# Patient Record
Sex: Male | Born: 1960 | Race: White | Hispanic: No | Marital: Married | Smoking: Never smoker
Health system: Southern US, Community
[De-identification: ages and names within clinical notes are randomized; demographics above are authoritative.]

## PROBLEM LIST (undated history)

## (undated) DIAGNOSIS — I10 Essential (primary) hypertension: Secondary | ICD-10-CM

## (undated) DIAGNOSIS — M1990 Unspecified osteoarthritis, unspecified site: Secondary | ICD-10-CM

## (undated) DIAGNOSIS — M549 Dorsalgia, unspecified: Secondary | ICD-10-CM

## (undated) DIAGNOSIS — Z9889 Other specified postprocedural states: Secondary | ICD-10-CM

## (undated) DIAGNOSIS — G894 Chronic pain syndrome: Secondary | ICD-10-CM

## (undated) DIAGNOSIS — E785 Hyperlipidemia, unspecified: Secondary | ICD-10-CM

## (undated) DIAGNOSIS — E119 Type 2 diabetes mellitus without complications: Secondary | ICD-10-CM

## (undated) DIAGNOSIS — Z87442 Personal history of urinary calculi: Secondary | ICD-10-CM

## (undated) DIAGNOSIS — K589 Irritable bowel syndrome without diarrhea: Secondary | ICD-10-CM

## (undated) DIAGNOSIS — M4800 Spinal stenosis, site unspecified: Secondary | ICD-10-CM

## (undated) DIAGNOSIS — S83289A Other tear of lateral meniscus, current injury, unspecified knee, initial encounter: Secondary | ICD-10-CM

## (undated) DIAGNOSIS — K219 Gastro-esophageal reflux disease without esophagitis: Secondary | ICD-10-CM

## (undated) DIAGNOSIS — E7800 Pure hypercholesterolemia, unspecified: Secondary | ICD-10-CM

## (undated) DIAGNOSIS — G4730 Sleep apnea, unspecified: Secondary | ICD-10-CM

## (undated) DIAGNOSIS — Z0000 Encounter for general adult medical examination without abnormal findings: Secondary | ICD-10-CM

## (undated) HISTORY — PX: ROTATOR CUFF REPAIR: SHX139

## (undated) HISTORY — PX: TONSILLECTOMY: SUR1361

## (undated) HISTORY — PX: PALATE SURGERY: SHX729

## (undated) HISTORY — DX: Unspecified osteoarthritis, unspecified site: M19.90

## (undated) HISTORY — PX: SHOULDER SURGERY: SHX246

## (undated) HISTORY — DX: Chronic pain syndrome: G89.4

## (undated) HISTORY — PX: CERVICAL FUSION: SHX112

## (undated) HISTORY — PX: FOOT SURGERY: SHX648

---

## 1993-01-17 HISTORY — PX: CERVICAL FUSION: SHX112

## 2001-05-03 ENCOUNTER — Ambulatory Visit (HOSPITAL_COMMUNITY): Admission: RE | Admit: 2001-05-03 | Discharge: 2001-05-03 | Payer: Self-pay | Admitting: Neurosurgery

## 2001-05-03 ENCOUNTER — Encounter: Payer: Self-pay | Admitting: Neurosurgery

## 2001-05-29 ENCOUNTER — Encounter: Payer: Self-pay | Admitting: Neurosurgery

## 2001-05-29 ENCOUNTER — Encounter: Admission: RE | Admit: 2001-05-29 | Discharge: 2001-05-29 | Payer: Self-pay | Admitting: Neurosurgery

## 2003-01-17 ENCOUNTER — Other Ambulatory Visit: Payer: Self-pay

## 2003-01-24 HISTORY — PX: COLONOSCOPY: SHX174

## 2004-04-05 ENCOUNTER — Ambulatory Visit: Payer: Self-pay | Admitting: Otolaryngology

## 2004-06-01 ENCOUNTER — Ambulatory Visit: Payer: Self-pay | Admitting: Urology

## 2004-12-15 ENCOUNTER — Other Ambulatory Visit: Payer: Self-pay

## 2004-12-22 ENCOUNTER — Ambulatory Visit: Payer: Self-pay | Admitting: Specialist

## 2005-05-27 ENCOUNTER — Other Ambulatory Visit: Payer: Self-pay

## 2005-05-31 ENCOUNTER — Emergency Department: Payer: Self-pay | Admitting: Emergency Medicine

## 2005-06-02 ENCOUNTER — Ambulatory Visit: Payer: Self-pay | Admitting: Specialist

## 2006-01-04 ENCOUNTER — Ambulatory Visit: Payer: Self-pay | Admitting: Otolaryngology

## 2006-01-13 ENCOUNTER — Emergency Department: Payer: Self-pay | Admitting: Otolaryngology

## 2006-06-05 ENCOUNTER — Emergency Department: Payer: Self-pay | Admitting: General Practice

## 2006-12-06 ENCOUNTER — Ambulatory Visit: Payer: Self-pay

## 2006-12-22 ENCOUNTER — Ambulatory Visit: Payer: Self-pay | Admitting: Urology

## 2007-01-02 ENCOUNTER — Ambulatory Visit: Payer: Self-pay | Admitting: Urology

## 2007-06-19 ENCOUNTER — Ambulatory Visit (HOSPITAL_BASED_OUTPATIENT_CLINIC_OR_DEPARTMENT_OTHER): Admission: RE | Admit: 2007-06-19 | Discharge: 2007-06-19 | Payer: Self-pay | Admitting: Orthopedic Surgery

## 2007-06-19 ENCOUNTER — Encounter (INDEPENDENT_AMBULATORY_CARE_PROVIDER_SITE_OTHER): Payer: Self-pay | Admitting: Orthopedic Surgery

## 2008-06-04 ENCOUNTER — Encounter: Admission: RE | Admit: 2008-06-04 | Discharge: 2008-06-04 | Payer: Self-pay | Admitting: Orthopedic Surgery

## 2008-06-09 ENCOUNTER — Emergency Department: Payer: Self-pay | Admitting: Emergency Medicine

## 2008-09-16 ENCOUNTER — Emergency Department: Payer: Self-pay | Admitting: Emergency Medicine

## 2009-01-20 ENCOUNTER — Emergency Department: Payer: Self-pay | Admitting: Emergency Medicine

## 2009-03-27 ENCOUNTER — Ambulatory Visit: Payer: Self-pay | Admitting: Neurosurgery

## 2009-11-30 ENCOUNTER — Emergency Department: Payer: Self-pay | Admitting: Emergency Medicine

## 2009-12-22 ENCOUNTER — Encounter: Admission: RE | Admit: 2009-12-22 | Discharge: 2009-12-22 | Payer: Self-pay | Admitting: Neurosurgery

## 2010-02-11 ENCOUNTER — Ambulatory Visit: Payer: Self-pay | Admitting: Urology

## 2010-02-25 ENCOUNTER — Ambulatory Visit: Payer: Self-pay | Admitting: Urology

## 2010-06-01 NOTE — Op Note (Signed)
NAME:  Adam Petty, Adam Petty NO.:  [id number]   MEDICAL RECORD NO.:  [id number]          PATIENT TYPE:  AMB   LOCATION:  DSC                          FACILITY:  [facility]   PHYSICIAN:  Leonides Grills, M.D.     DATE OF BIRTH:  09/13/1960   DATE OF PROCEDURE:  06/19/2007  DATE OF DISCHARGE:                               OPERATIVE REPORT   PREOPERATIVE DIAGNOSIS:  Right benign deep soft tissue lesion, foot.   POSTOPERATIVE DIAGNOSIS:  Right benign deep soft tissue lesion, foot.   OPERATION:  1. Excision right benign deep soft tissue lesion, foot.  2. Right superficial peroneal nerve neurolysis.   ANESTHESIA:  General.   SURGEON:  Leonides Grills, MD   ASSISTANT:  Richardean Canal, PA   ESTIMATED BLOOD LOSS:  Minimal.   TOURNIQUET TIME:  Approximately 40 minutes.   COMPLICATIONS:  None.   SPECIMEN:  A cystic-type structure and there was no fluid within the  cyst.  It appeared that it had popped.  The specimen was sent to  pathology.   DISPOSITION:  Stable to the PR.   INDICATIONS:  This is a 50 year old male who while at work approximately  7 months ago stepped off a step awkwardly and twisted his foot.  Since  this time, he has had persistent pain on the dorsum of his foot.  MRI  revealed a cystic lesion and he was tender directly over the cystic  lesion.  He was consented for the above procedure.  All risks of  infection or vessel injury, recurrence of lesion, recurrence of pain,  persistent pain, prolonged recovery, scar tissue formation, and extensor  contracture were all explained.  Questions were encouraged and answered.   OPERATION:  The patient was brought to the operating room and placed in  supine position, after adequate general endotracheal tube anesthesia was  administered as well as Ancef 1 g IV piggyback.  The right lower  extremity was then prepped and draped in sterile manner with a  proximally placed thigh tourniquet.  The limb was gravity  exsanguinated.  Tourniquet was elevated to 290 mmHg.  A longitudinal incision between  the third and fourth metatarsal shaft was then made.  Dissection was  carried down through the skin.  Hemostasis was obtained.  Superficial  peroneal nerve was then immediately identified which was directly over  this area and this was carefully dissected out and a formal neurolysis  was then performed.  This was then mobilized.  We then incised the  retinaculum over the extensor tendons and retracted the extensor  digitorum longus to the third and fourth toes respectively.  We then  underneath the brevis tendon found a what appears to be a cyst that was  popped and was right next to the metatarsal shaft and underneath this  muscle belly area.  This was carefully dissected out in its entirety.  It was almost 2 cm long about 6-7 mm wide.  Once this was carefully  dissected out and resected, we then removed the dorsal fascia and the  interosseous muscles well just in case it originated from this area  as  well as a portion of the brevis tendon that was in the area as well.  All this was sent to pathology.  There was also a mobile lesion that you  could feel through the skin on the dorsal aspect at that TMT joint  level.  This was dissected out and it appeared to be a what appeared to  be a thrombosed vessel in this area as well and this again was removed.  Tourniquet was deflated.  Hemostasis was obtained.  There was no  pulsatile bleeding.  The area was copiously irrigated with normal  saline.  Subcu was closed with 3-0 Vicryl.  Skin was closed with 4-0  nylon.  Sterile dressing was applied.  Cam walker boot was applied.  Patient was stable to PR.      Leonides Grills, M.D.  Electronically Signed     PB/MEDQ  D:  06/19/2007  T:  06/19/2007  Job:  [id number]

## 2010-06-04 NOTE — Op Note (Signed)
[place]  Patient:    Adam Petty, Adam Petty Visit Number: [id number] MRN: [id number]          Service Type: DSU Location: 3000 [id number] Attending Physician:  Gerald Dexter Dictated by:   Reinaldo Meeker, M.D. Proc. Date: 05/04/01 Admit Date:  05/03/2001 Discharge Date: 05/03/2001                             Operative Report  PREOPERATIVE DIAGNOSIS:  Herniated disk, C6-7 right.  POSTOPERATIVE DIAGNOSIS:  Herniated disk, C6-7 right.  OPERATION PERFORMED:  C6-7 anterior cervical diskectomy with bone bank fusion followed by Zephyr anterior cervical plating with the operating microscope.  SURGEON:  Reinaldo Meeker, M.D.  ASSISTANT:  Julio Sicks, M.D.  ANESTHESIA:  General.  DESCRIPTION OF PROCEDURE:  After being placed in supine position and 10 pounds of halter traction, the patients neck was prepped with Betadine and draped in the usual sterile fashion.  Localizing fluoroscopy was used prior to incision to identify the appropriate level.  A transverse incision was made in the right anterior neck starting at the midline and heading towards the medial aspect of the sternocleidomastoid muscle.  The platysma was then incised transversely.  The natural fascial plane between the strap muscles medially and the sternocleidomastoid laterally was identified and followed down to the anterior aspect of the cervical spine.  The longus colli muscles were identified and split in the midline and stripped away bilaterally with a Optician, dispensing.  A self-retaining retractor was placed for exposure.  A second fluoroscopy showed approach to the C6-7 level.  Using the 15 blade, the annulus of the disk was incised.  Using pituitary rongeurs and curets, approximately 90% of the disk material was removed.  The microscope was draped and brought into the field and used for the remainder of the case. Using microdissection technique, the  remainder of the disk material down to the posterior longitudinal ligament was removed.  The ligament was then incised transversely.  The cut edges were removed.  Towards the right, symptomatic side, a large amount of herniated disk material was found dorsal to the ligament and this was removed to give excellent decompression of the underlying  C7 nerve root which was tracked into its proximal neural foramen. At this point inspection was carried out in all directions for any evidence of residual compression.  None could be identified.  Large amounts of irrigation were carried out.  Measurements were taken and an 8 mm bone bank plug was reconstituted.  After irrigating once more and confirming hemostasis, the plug was impacted without difficulty.  A 25 mm Zephyr anterior cervical plate was then chosen.  Under fluoroscopic guidance, drill holes were placed followed by placement of 13 mm screws times four.  Final fluoroscopy showed the plate and plug to be in good position.  At this point large amounts of irrigation were carried out and any bleeding was controlled with bipolar coagulation. The wound was then closed using interrupted Vicryl on the platysma muscle, inverted 5-0 PDS on the subcuticular layer and Steri-Strips on the skin. Sterile dressing was then applied ____________ soft collar and the patient was extubated and taken to recovery room in stable condition. Dictated by:   Reinaldo Meeker, M.D. Attending Physician:  Gerald Dexter DD:  05/04/01 TD:  05/04/01 Job: [id number]

## 2010-10-14 LAB — BASIC METABOLIC PANEL
BUN: 17
Calcium: 9.4
Chloride: 106
Creatinine, Ser: 1
GFR calc Af Amer: >60

## 2010-10-14 LAB — POCT HEMOGLOBIN-HEMACUE: Hemoglobin: 10.3 — ABNORMAL LOW

## 2010-12-13 ENCOUNTER — Encounter: Payer: Self-pay | Admitting: Orthopedic Surgery

## 2010-12-18 ENCOUNTER — Encounter: Payer: Self-pay | Admitting: Orthopedic Surgery

## 2011-01-18 ENCOUNTER — Encounter: Payer: Self-pay | Admitting: Orthopedic Surgery

## 2011-02-02 ENCOUNTER — Emergency Department: Payer: Self-pay | Admitting: Emergency Medicine

## 2011-02-18 ENCOUNTER — Encounter: Payer: Self-pay | Admitting: Orthopedic Surgery

## 2011-03-15 ENCOUNTER — Encounter: Payer: Self-pay | Admitting: Psychiatry

## 2011-03-18 ENCOUNTER — Encounter: Payer: Self-pay | Admitting: Orthopedic Surgery

## 2011-03-18 ENCOUNTER — Encounter: Payer: Self-pay | Admitting: Psychiatry

## 2011-04-18 ENCOUNTER — Encounter: Payer: Self-pay | Admitting: Orthopedic Surgery

## 2011-04-18 ENCOUNTER — Encounter: Payer: Self-pay | Admitting: Psychiatry

## 2011-05-18 ENCOUNTER — Encounter: Payer: Self-pay | Admitting: Psychiatry

## 2012-01-31 ENCOUNTER — Ambulatory Visit: Payer: Self-pay

## 2012-02-16 ENCOUNTER — Ambulatory Visit: Payer: Self-pay

## 2012-05-16 ENCOUNTER — Ambulatory Visit: Payer: Self-pay | Admitting: Pain Medicine

## 2012-05-29 ENCOUNTER — Other Ambulatory Visit: Payer: Self-pay | Admitting: Pain Medicine

## 2012-05-29 LAB — BASIC METABOLIC PANEL
Anion Gap: 6 — ABNORMAL LOW (ref 7–16)
Calcium, Total: 9.7 mg/dL (ref 8.5–10.1)
Chloride: 106 mmol/L (ref 98–107)
Co2: 25 mmol/L (ref 21–32)
Creatinine: 0.94 mg/dL (ref 0.60–1.30)
EGFR (African American): >60
EGFR (Non-African Amer.): >60
Potassium: 4 mmol/L (ref 3.5–5.1)

## 2012-05-29 LAB — SEDIMENTATION RATE: Erythrocyte Sed Rate: 6 mm/hr (ref 0–15)

## 2012-05-29 LAB — URIC ACID: Uric Acid: 4.1 mg/dL (ref 3.5–7.2)

## 2012-07-05 ENCOUNTER — Ambulatory Visit: Payer: Self-pay | Admitting: Pain Medicine

## 2012-08-06 ENCOUNTER — Ambulatory Visit: Payer: Self-pay | Admitting: Pain Medicine

## 2012-08-30 ENCOUNTER — Observation Stay: Payer: Self-pay | Admitting: Internal Medicine

## 2012-08-30 LAB — COMPREHENSIVE METABOLIC PANEL
Albumin: 3.7 g/dL (ref 3.4–5.0)
Anion Gap: 3 — ABNORMAL LOW (ref 7–16)
Anion Gap: 4 — ABNORMAL LOW (ref 7–16)
BUN: 19 mg/dL — ABNORMAL HIGH (ref 7–18)
Bilirubin,Total: 0.5 mg/dL (ref 0.2–1.0)
Calcium, Total: 9.4 mg/dL (ref 8.5–10.1)
Chloride: 103 mmol/L (ref 98–107)
Chloride: 103 mmol/L (ref 98–107)
Co2: 25 mmol/L (ref 21–32)
Creatinine: 0.63 mg/dL (ref 0.60–1.30)
EGFR (African American): >60
EGFR (Non-African Amer.): >60
Glucose: 131 mg/dL — ABNORMAL HIGH (ref 65–99)
Glucose: 124 mg/dL — ABNORMAL HIGH (ref 65–99)
Osmolality: 267 (ref 275–301)
Osmolality: 272 (ref 275–301)
Potassium: 6.6 mmol/L (ref 3.5–5.1)
SGOT(AST): 101 U/L — ABNORMAL HIGH (ref 15–37)
SGPT (ALT): 85 U/L — ABNORMAL HIGH (ref 12–78)
SGPT (ALT): 85 U/L — ABNORMAL HIGH (ref 12–78)
Sodium: 134 mmol/L — ABNORMAL LOW (ref 136–145)

## 2012-08-30 LAB — CBC
MCH: 29.9 pg (ref 26.0–34.0)
MCV: 85 fL (ref 80–100)
Platelet: 186 10*3/uL (ref 150–440)
RBC: 5.16 10*6/uL (ref 4.40–5.90)
RDW: 14.1 % (ref 11.5–14.5)
WBC: 8.7 10*3/uL (ref 3.8–10.6)

## 2012-08-30 LAB — URINALYSIS, COMPLETE
Bacteria: NONE SEEN
Blood: NEGATIVE
Protein: NEGATIVE
Specific Gravity: 1.01 (ref 1.003–1.030)
Squamous Epithelial: NONE SEEN
WBC UR: <1 /HPF (ref 0–5)

## 2012-08-30 LAB — CK TOTAL AND CKMB (NOT AT ARMC)
CK, Total: 309 U/L — ABNORMAL HIGH (ref 35–232)
CK, Total: 234 U/L — ABNORMAL HIGH (ref 35–232)
CK-MB: 2.2 ng/mL (ref 0.5–3.6)
CK-MB: 1 ng/mL (ref 0.5–3.6)

## 2012-08-30 LAB — TROPONIN I: Troponin-I: <0.02 ng/mL

## 2012-08-31 LAB — CK TOTAL AND CKMB (NOT AT ARMC): CK, Total: 184 U/L (ref 35–232)

## 2012-08-31 LAB — BASIC METABOLIC PANEL
Anion Gap: 7 (ref 7–16)
Calcium, Total: 8.7 mg/dL (ref 8.5–10.1)
Creatinine: 1.06 mg/dL (ref 0.60–1.30)
Glucose: 151 mg/dL — ABNORMAL HIGH (ref 65–99)
Potassium: 3.9 mmol/L (ref 3.5–5.1)
Sodium: 135 mmol/L — ABNORMAL LOW (ref 136–145)

## 2012-08-31 LAB — CBC WITH DIFFERENTIAL/PLATELET
Basophil #: 0 10*3/uL (ref 0.0–0.1)
Basophil %: 0.5 %
HGB: 14.9 g/dL (ref 13.0–18.0)
Lymphocyte #: 3.1 10*3/uL (ref 1.0–3.6)
MCH: 30 pg (ref 26.0–34.0)
MCV: 86 fL (ref 80–100)
Monocyte #: 0.5 x10 3/mm (ref 0.2–1.0)
Monocyte %: 5.7 %
Neutrophil #: 5.2 10*3/uL (ref 1.4–6.5)
Neutrophil %: 57.4 %
Platelet: 158 10*3/uL (ref 150–440)

## 2012-08-31 LAB — LIPID PANEL: Triglycerides: 405 mg/dL — ABNORMAL HIGH (ref 0–200)

## 2012-08-31 LAB — TROPONIN I: Troponin-I: <0.02 ng/mL

## 2012-08-31 LAB — MAGNESIUM: Magnesium: 2.3 mg/dL

## 2012-08-31 LAB — HEMOGLOBIN A1C: Hemoglobin A1C: 9.2 % — ABNORMAL HIGH (ref 4.2–6.3)

## 2012-09-04 ENCOUNTER — Ambulatory Visit: Payer: Self-pay | Admitting: Pain Medicine

## 2012-09-14 ENCOUNTER — Ambulatory Visit: Payer: Self-pay | Admitting: Pain Medicine

## 2012-09-24 ENCOUNTER — Other Ambulatory Visit: Payer: Self-pay | Admitting: Pain Medicine

## 2012-09-24 LAB — BASIC METABOLIC PANEL
Anion Gap: 6 — ABNORMAL LOW (ref 7–16)
Chloride: 104 mmol/L (ref 98–107)
EGFR (African American): >60
EGFR (Non-African Amer.): >60
Osmolality: 279 (ref 275–301)
Potassium: 4 mmol/L (ref 3.5–5.1)
Sodium: 137 mmol/L (ref 136–145)

## 2012-10-19 ENCOUNTER — Ambulatory Visit: Payer: Self-pay | Admitting: Pain Medicine

## 2012-10-25 ENCOUNTER — Ambulatory Visit: Payer: Self-pay | Admitting: Pain Medicine

## 2012-11-14 ENCOUNTER — Ambulatory Visit: Payer: Self-pay | Admitting: Pain Medicine

## 2012-11-30 ENCOUNTER — Ambulatory Visit: Payer: Self-pay | Admitting: Pain Medicine

## 2012-12-22 ENCOUNTER — Emergency Department: Payer: Self-pay | Admitting: Emergency Medicine

## 2012-12-22 LAB — CBC
MCH: 28.5 pg (ref 26.0–34.0)
MCV: 86 fL (ref 80–100)
Platelet: 189 10*3/uL (ref 150–440)
RBC: 5.82 10*6/uL (ref 4.40–5.90)
WBC: 7.6 10*3/uL (ref 3.8–10.6)

## 2012-12-22 LAB — BASIC METABOLIC PANEL
Anion Gap: 10 (ref 7–16)
BUN: 14 mg/dL (ref 7–18)
Chloride: 101 mmol/L (ref 98–107)
Co2: 24 mmol/L (ref 21–32)
EGFR (African American): >60
EGFR (Non-African Amer.): >60
Osmolality: 277 (ref 275–301)
Potassium: 4.1 mmol/L (ref 3.5–5.1)
Sodium: 135 mmol/L — ABNORMAL LOW (ref 136–145)

## 2013-01-28 NOTE — Progress Notes (Signed)
Surgery scheduled for 02/06/13.  preop on 01/31/13 at 1330pm.  Need orders in EPIC.  Thank You.

## 2013-01-29 ENCOUNTER — Encounter (HOSPITAL_COMMUNITY): Payer: Self-pay | Admitting: Pharmacy Technician

## 2013-01-30 NOTE — Patient Instructions (Addendum)
Adam Petty  01/30/2013                           YOUR PROCEDURE IS SCHEDULED ON:  02/06/13               PLEASE REPORT TO SHORT STAY CENTER AT : 9:00 AM               CALL THIS NUMBER IF ANY PROBLEMS THE DAY OF SURGERY :               [phone]                      REMEMBER:   Do not eat food or drink liquids AFTER MIDNIGHT   Take these medicines the morning of surgery with A SIP OF WATER: CRESTOR / MAY TAKE OXYCODONE IF NEEDED FOR PAIN   Do not wear jewelry, make-up   Do not wear lotions, powders, or perfumes.   Do not shave legs or underarms 12 hrs. before surgery (men may shave face)  Do not bring valuables to the hospital.  Contacts, dentures or bridgework may not be worn into surgery.  Leave suitcase in the car. After surgery it may be brought to your room.  For patients admitted to the hospital more than one night, checkout time is 11:00                          The day of discharge.   Patients discharged the day of surgery will not be allowed to drive home                             If going home same day of surgery, must have someone stay with you first                           24 hrs at home and arrange for some one to drive you home from hospital.    Special Instructions:   Please read over the following fact sheets that you were given:               1. MRSA  INFORMATION                      2. [place] PREPARING FOR SURGERY SHEET                                                X_____________________________________________________________________        Failure to follow these instructions may result in cancellation of your surgery

## 2013-01-31 ENCOUNTER — Ambulatory Visit (HOSPITAL_COMMUNITY)
Admission: RE | Admit: 2013-01-31 | Discharge: 2013-01-31 | Disposition: A | Discharge status: Home / Self Care | Payer: Worker's Compensation | Source: Ambulatory Visit | Attending: Specialist | Admitting: Specialist

## 2013-01-31 ENCOUNTER — Encounter (HOSPITAL_COMMUNITY): Payer: Self-pay

## 2013-01-31 ENCOUNTER — Encounter (HOSPITAL_COMMUNITY)
Admission: RE | Admit: 2013-01-31 | Discharge: 2013-01-31 | Disposition: A | Discharge status: Home / Self Care | Payer: Worker's Compensation | Source: Ambulatory Visit | Attending: Specialist | Admitting: Specialist

## 2013-01-31 ENCOUNTER — Other Ambulatory Visit: Payer: Self-pay | Admitting: Orthopedic Surgery

## 2013-01-31 DIAGNOSIS — Z01818 Encounter for other preprocedural examination: Secondary | ICD-10-CM | POA: Diagnosis present

## 2013-01-31 DIAGNOSIS — Z01812 Encounter for preprocedural laboratory examination: Secondary | ICD-10-CM | POA: Diagnosis not present

## 2013-01-31 DIAGNOSIS — M5137 Other intervertebral disc degeneration, lumbosacral region: Secondary | ICD-10-CM | POA: Insufficient documentation

## 2013-01-31 DIAGNOSIS — M51379 Other intervertebral disc degeneration, lumbosacral region without mention of lumbar back pain or lower extremity pain: Secondary | ICD-10-CM | POA: Insufficient documentation

## 2013-01-31 HISTORY — DX: Dorsalgia, unspecified: M54.9

## 2013-01-31 HISTORY — DX: Personal history of urinary calculi: Z87.442

## 2013-01-31 HISTORY — DX: Irritable bowel syndrome, unspecified: K58.9

## 2013-01-31 HISTORY — DX: Type 2 diabetes mellitus without complications: E11.9

## 2013-01-31 HISTORY — DX: Encounter for general adult medical examination without abnormal findings: Z00.00

## 2013-01-31 LAB — SURGICAL PCR SCREEN
MRSA, PCR: NEGATIVE
STAPHYLOCOCCUS AUREUS: NEGATIVE

## 2013-01-31 LAB — CBC
HCT: 45 % (ref 39.0–52.0)
HEMOGLOBIN: 15.4 g/dL (ref 13.0–17.0)
MCH: 29.1 pg (ref 26.0–34.0)
MCHC: 34.2 g/dL (ref 30.0–36.0)
MCV: 84.9 fL (ref 78.0–100.0)
Platelets: 198 10*3/uL (ref 150–400)
RBC: 5.3 MIL/uL (ref 4.22–5.81)
RDW: 13.5 % (ref 11.5–15.5)
WBC: 8.7 10*3/uL (ref 4.0–10.5)

## 2013-01-31 LAB — BASIC METABOLIC PANEL
BUN: 16 mg/dL (ref 6–23)
CALCIUM: 9.9 mg/dL (ref 8.4–10.5)
CO2: 25 meq/L (ref 19–32)
Chloride: 100 mEq/L (ref 96–112)
Creatinine, Ser: 0.93 mg/dL (ref 0.50–1.35)
GFR calc Af Amer: >90 mL/min (ref >90)
GFR calc non Af Amer: >90 mL/min (ref >90)
GLUCOSE: 154 mg/dL — AB (ref 70–99)
Potassium: 5.2 mEq/L (ref 3.7–5.3)
SODIUM: 139 meq/L (ref 137–147)

## 2013-01-31 NOTE — Progress Notes (Signed)
EKG from [facility] - 08/30/12 - on chart

## 2013-01-31 NOTE — H&P (Signed)
Adam Petty is an 53 y.o. male.   Chief Complaint: back and leg pain HPI: The patient is a 53 year old male who presents today for follow up of their back. The patient is being followed for their low back symptoms. They are now 23 month(s) out from injury. Symptoms reported today include: pain. The patient states that they are doing poorly. Current treatment includes: activity modification and pain medications. The following medication has been used for pain control: Oxycodone (prescribed by pain management). The patient reports their current pain level to be moderate to severe. The patient indicates that they have questions or concerns today regarding surgery. Note for "Follow-up back": The patient is currently working light duty with a 10lb. lifting restriction, no prolonged sitting or standing, and no repetitive bending.   Apparently surgery has been approved. He continues to have severe right lower extremity radicular pain. He also has some pain into the left.  Past Medical History  Diagnosis Date  . Back pain   . IBS (irritable bowel syndrome)   . History of kidney stones   . Diabetes mellitus without complication   . Preventative health care     takes crestor/ altace for "preventative reasons" denies heart or BP problems    Past Surgical History  Procedure Laterality Date  . Tonsillectomy    . Shoulder surgery      x5 on RT / x1 LFT  . Palate surgery      surg for sleep apnea and sinus issues  . Cervical fusion    . Foot surgery      RT    No family history on file. Social History:  reports that he has never smoked. He does not have any smokeless tobacco history on file. He reports that he drinks alcohol. He reports that he does not use illicit drugs.  Allergies: No Known Allergies   (Not in a hospital admission)  Results for orders placed during the hospital encounter of 01/31/13 (from the past 48 hour(s))  SURGICAL PCR SCREEN     Status: None   Collection Time     01/31/13  2:07 PM      Result Value Range   MRSA, PCR NEGATIVE  NEGATIVE   Staphylococcus aureus NEGATIVE  NEGATIVE   Comment:            The Xpert SA Assay (FDA     approved for NASAL specimens     in patients over 1 years of age),     is one component of     a comprehensive surveillance     program.  Test performance has     been validated by [employer] for patients greater     than or equal to 41 year old.     It is not intended     to diagnose infection nor to     guide or monitor treatment.  CBC     Status: None   Collection Time    01/31/13  2:10 PM      Result Value Range   WBC 8.7  4.0 - 10.5 K/uL   RBC 5.30  4.22 - 5.81 MIL/uL   Hemoglobin 15.4  13.0 - 17.0 g/dL   HCT 45.0  39.0 - 52.0 %   MCV 84.9  78.0 - 100.0 fL   MCH 29.1  26.0 - 34.0 pg   MCHC 34.2  30.0 - 36.0 g/dL   RDW 13.5  11.5 -  15.5 %   Platelets 198  150 - 400 K/uL  BASIC METABOLIC PANEL     Status: Abnormal   Collection Time    01/31/13  2:10 PM      Result Value Range   Sodium 139  137 - 147 mEq/L   Potassium 5.2  3.7 - 5.3 mEq/L   Chloride 100  96 - 112 mEq/L   CO2 25  19 - 32 mEq/L   Glucose, Bld 154 (*) 70 - 99 mg/dL   BUN 16  6 - 23 mg/dL   Creatinine, Ser 0.93  0.50 - 1.35 mg/dL   Calcium 9.9  8.4 - 10.5 mg/dL   GFR calc non Af Amer >90  >90 mL/min   GFR calc Af Amer >90  >90 mL/min   Comment: (NOTE)     The eGFR has been calculated using the CKD EPI equation.     This calculation has not been validated in all clinical situations.     eGFR's persistently <90 mL/min signify possible Chronic Kidney     Disease.   Dg Chest 2 View  01/31/2013   CLINICAL DATA:  Preop lumbar spine surgery  EXAM: CHEST  2 VIEW  COMPARISON:  None.  FINDINGS: The heart size and mediastinal contours are within normal limits. Both lungs are clear. The visualized skeletal structures are unremarkable.  IMPRESSION: No active cardiopulmonary disease.   Electronically Signed   By: Skipper Cliche M.D.   On:  01/31/2013 15:16   X-ray Lumbar Spine Ap And Lateral  01/31/2013   CLINICAL DATA:  PREOP LUMBAR DECOMPRESSION  EXAM: LUMBAR SPINE - 2-3 VIEW  COMPARISON:  None.  FINDINGS: There are 5 nonrib bearing lumbar-type vertebral bodies. The vertebral body heights are maintained. The alignment is anatomic. There is no spondylolysis. There is no acute fracture or static listhesis. There is severe degenerative disc disease at L4-5 with facet arthropathy.  The SI joints are unremarkable.  IMPRESSION: Severe degenerative disc disease at L4-5.   Electronically Signed   By: Kathreen Devoid   On: 01/31/2013 16:06    Review of Systems  Constitutional: Negative.   HENT: Negative.   Eyes: Negative.   Respiratory: Negative.   Cardiovascular: Negative.   Gastrointestinal: Negative.   Genitourinary: Negative.   Musculoskeletal: Positive for back pain.  Skin: Negative.   Neurological: Positive for focal weakness.  Psychiatric/Behavioral: Negative.     There were no vitals taken for this visit. Physical Exam  Constitutional: He is oriented to person, place, and time. He appears well-developed and well-nourished.  HENT:  Head: Normocephalic and atraumatic.  Eyes: Conjunctivae and EOM are normal. Pupils are equal, round, and reactive to light.  Neck: Normal range of motion. Neck supple.  Cardiovascular: Normal rate and regular rhythm.   Respiratory: Effort normal and breath sounds normal.  GI: Soft. Bowel sounds are normal.  Musculoskeletal:  On exam, he's upright. SLR continues to produce buttock, thigh and calf pain exacerbated with dorsal augmentation maneuver. EHL is 5-/5 as is his quad. Contralateral SLR produces buttock pain. He has no pain in the back with flexion or extension.  Lumbar spine exam reveals no evidence of soft tissue swelling, ecchymosis or deformity. The abdomen is soft and nontender. Nontender over the trochanters. No cellulitis or lymphadenopathy.  Motor is 5/5 including tibialis  anterior, plantar flexion, quadriceps and hamstrings. Patient is normoreflexic. There is no Babinski or clonus. Sensory exam is intact to light touch. The patient has good distal pulses. No  DVT. No pain and normal range of motion without instability of the hips, knees and ankles.  Inspection of the cervical spine reveals a normal lordosis without evidence of paraspinous spasms or soft tissue swelling. Nontender to palpation. Full flexion, full extension, full left and right lateral rotation. Extension combined with lateral flexion does not reproduce pain. Negative impingement sign, negative secondary impingement sign of the shoulders. Negative Tinel's at median and ulnar nerves at the elbow. Negative carpal compression test at the wrists. Motor of the upper extremities is 5/5 including biceps, triceps, brachioradialis,wrist flexion, wrist extension, finger flexion, finger extension. Reflexes are normoreflexic. Sensory exam is intact to light touch. There is no Hoffmann sign. Nontender over the thoracic spine.  Neurological: He is alert and oriented to person, place, and time. He has normal reflexes.  Skin: Skin is warm and dry.  Psychiatric: He has a normal mood and affect.    MRI demonstrates disc herniation, severe lateral recess stenosis at 4-5, neural foraminal stenosis. No instability in flexion or extension.  His x-rays demonstrate the defect in the endplate of 5.  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  As previously discussed, we discussed lumbar decompression at 4-5, laminectomy and bilateral foraminotomies of L4 and L5 without a concomitant fusion.  Risks and benefits of this procedure were discussed with the patient including worsening of symptoms, no changes in symptoms, recurrent disc herniation, scar tissue,  epidural fibrosis, damage to neurovascular structures, cerebral spinal fluid leak which would require repair or patching, DVT, PE, anesthetic complications, etc. We discussed the perioperative course, the hospitalization, and the need for postoperative rehabilitation and the time estimate for recovery. We also discussed the possibility of future surgery including repeat decompression, fusion. The patient was provided an illustrated handout which was discussed in detail. Appropriate anatomic models were used as well.  No history of MRSA.  Plan microlumbar decompression L4-5, possible L3-4  BISSELL, JACLYN M. for Dr. Tonita Cong 01/31/2013, 9:10 PM

## 2013-01-31 NOTE — Progress Notes (Signed)
No orders in EPIC at time of PST visit - orders done per anesthesia protocol

## 2013-01-31 NOTE — Progress Notes (Signed)
01/31/13 1347  OBSTRUCTIVE SLEEP APNEA  Have you ever been diagnosed with sleep apnea through a sleep study? No  Do you snore loudly (loud enough to be heard through closed doors)?  0  Do you often feel tired, fatigued, or sleepy during the daytime? 0  Has anyone observed you stop breathing during your sleep? 0  Do you have, or are you being treated for high blood pressure? 0  BMI more than 35 kg/m2? 1  Age over 51 years old? 1  Neck circumference greater than 40 cm/18 inches? 1  Gender: 1  Obstructive Sleep Apnea Score 4  Score 4 or greater  Results sent to PCP

## 2013-02-06 ENCOUNTER — Encounter (HOSPITAL_COMMUNITY)
Admission: RE | Disposition: A | Discharge status: Home / Self Care | Payer: Self-pay | Source: Ambulatory Visit | Attending: Specialist

## 2013-02-06 ENCOUNTER — Encounter (HOSPITAL_COMMUNITY): Payer: Self-pay | Admitting: *Deleted

## 2013-02-06 ENCOUNTER — Ambulatory Visit (HOSPITAL_COMMUNITY): Payer: Worker's Compensation | Admitting: Anesthesiology

## 2013-02-06 ENCOUNTER — Ambulatory Visit (HOSPITAL_COMMUNITY): Payer: Worker's Compensation

## 2013-02-06 ENCOUNTER — Observation Stay (HOSPITAL_COMMUNITY)
Admission: RE | Admit: 2013-02-06 | Discharge: 2013-02-07 | Disposition: A | Discharge status: Home / Self Care | Payer: Worker's Compensation | Source: Ambulatory Visit | Attending: Specialist | Admitting: Specialist

## 2013-02-06 ENCOUNTER — Encounter (HOSPITAL_COMMUNITY): Payer: Worker's Compensation | Admitting: Anesthesiology

## 2013-02-06 DIAGNOSIS — M48061 Spinal stenosis, lumbar region without neurogenic claudication: Secondary | ICD-10-CM | POA: Diagnosis present

## 2013-02-06 DIAGNOSIS — E119 Type 2 diabetes mellitus without complications: Secondary | ICD-10-CM | POA: Insufficient documentation

## 2013-02-06 DIAGNOSIS — X58XXXA Exposure to other specified factors, initial encounter: Secondary | ICD-10-CM | POA: Insufficient documentation

## 2013-02-06 DIAGNOSIS — Z79899 Other long term (current) drug therapy: Secondary | ICD-10-CM | POA: Insufficient documentation

## 2013-02-06 DIAGNOSIS — IMO0002 Reserved for concepts with insufficient information to code with codable children: Secondary | ICD-10-CM | POA: Insufficient documentation

## 2013-02-06 DIAGNOSIS — M5126 Other intervertebral disc displacement, lumbar region: Principal | ICD-10-CM | POA: Insufficient documentation

## 2013-02-06 HISTORY — PX: LUMBAR LAMINECTOMY/DECOMPRESSION MICRODISCECTOMY: SHX5026

## 2013-02-06 LAB — GLUCOSE, CAPILLARY
GLUCOSE-CAPILLARY: 143 mg/dL — AB (ref 70–99)
Glucose-Capillary: 137 mg/dL — ABNORMAL HIGH (ref 70–99)
Glucose-Capillary: 129 mg/dL — ABNORMAL HIGH (ref 70–99)
Glucose-Capillary: 155 mg/dL — ABNORMAL HIGH (ref 70–99)

## 2013-02-06 SURGERY — LUMBAR LAMINECTOMY/DECOMPRESSION MICRODISCECTOMY 2 LEVELS
Anesthesia: General | Site: Back

## 2013-02-06 MED ORDER — GLYCOPYRROLATE 0.2 MG/ML IJ SOLN
INTRAMUSCULAR | Status: AC
  Filled 2013-02-06: qty 3

## 2013-02-06 MED ORDER — LIDOCAINE HCL (CARDIAC) 20 MG/ML IV SOLN
INTRAVENOUS | Status: DC | PRN
  Administered 2013-02-06: 75 mg via INTRAVENOUS

## 2013-02-06 MED ORDER — HYDROMORPHONE HCL PF 1 MG/ML IJ SOLN
INTRAMUSCULAR | Status: AC
  Filled 2013-02-06: qty 1

## 2013-02-06 MED ORDER — METHOCARBAMOL 500 MG PO TABS
500.0000 mg | ORAL_TABLET | Freq: Four times a day (QID) | ORAL | Status: DC | PRN
  Administered 2013-02-07: 500 mg via ORAL
  Filled 2013-02-06: qty 1

## 2013-02-06 MED ORDER — ONDANSETRON HCL 4 MG/2ML IJ SOLN
INTRAMUSCULAR | Status: DC | PRN
  Administered 2013-02-06 (×2): 2 mg via INTRAVENOUS

## 2013-02-06 MED ORDER — NEOSTIGMINE METHYLSULFATE 1 MG/ML IJ SOLN
INTRAMUSCULAR | Status: DC | PRN
  Administered 2013-02-06: 4 mg via INTRAVENOUS

## 2013-02-06 MED ORDER — OXYCODONE HCL 5 MG PO TABS: 5.0000 mg | ORAL_TABLET | ORAL | Status: DC | PRN

## 2013-02-06 MED ORDER — MIDAZOLAM HCL 2 MG/2ML IJ SOLN
INTRAMUSCULAR | Status: AC
  Filled 2013-02-06: qty 2

## 2013-02-06 MED ORDER — FLEET ENEMA 7-19 GM/118ML RE ENEM: 1.0000 | ENEMA | Freq: Once | RECTAL | Status: AC | PRN

## 2013-02-06 MED ORDER — SUCCINYLCHOLINE CHLORIDE 20 MG/ML IJ SOLN
INTRAMUSCULAR | Status: DC | PRN
  Administered 2013-02-06: 140 mg via INTRAVENOUS

## 2013-02-06 MED ORDER — LACTATED RINGERS IV SOLN
INTRAVENOUS | Status: DC | PRN
  Administered 2013-02-06 (×2): via INTRAVENOUS

## 2013-02-06 MED ORDER — CISATRACURIUM BESYLATE (PF) 10 MG/5ML IV SOLN
INTRAVENOUS | Status: DC | PRN
  Administered 2013-02-06: 10 mg via INTRAVENOUS
  Administered 2013-02-06: 2 mg via INTRAVENOUS

## 2013-02-06 MED ORDER — RAMIPRIL 5 MG PO CAPS
5.0000 mg | ORAL_CAPSULE | Freq: Every day | ORAL | Status: DC
  Administered 2013-02-07: 5 mg via ORAL
  Filled 2013-02-06: qty 1

## 2013-02-06 MED ORDER — THROMBIN 5000 UNITS EX SOLR
CUTANEOUS | Status: DC | PRN
  Administered 2013-02-06: 10000 [IU] via TOPICAL

## 2013-02-06 MED ORDER — NEOSTIGMINE METHYLSULFATE 1 MG/ML IJ SOLN
INTRAMUSCULAR | Status: AC
  Filled 2013-02-06: qty 10

## 2013-02-06 MED ORDER — HYDROMORPHONE HCL PF 1 MG/ML IJ SOLN
0.5000 mg | INTRAMUSCULAR | Status: DC | PRN
  Administered 2013-02-06 – 2013-02-07 (×4): 1 mg via INTRAVENOUS
  Filled 2013-02-06 (×4): qty 1

## 2013-02-06 MED ORDER — OXYCODONE HCL 5 MG PO TABS
5.0000 mg | ORAL_TABLET | ORAL | Status: DC | PRN
  Administered 2013-02-06 – 2013-02-07 (×2): 10 mg via ORAL
  Filled 2013-02-06 (×3): qty 2

## 2013-02-06 MED ORDER — BUPIVACAINE-EPINEPHRINE (PF) 0.5% -1:200000 IJ SOLN
INTRAMUSCULAR | Status: DC | PRN
  Administered 2013-02-06: 12 mL

## 2013-02-06 MED ORDER — ACETAMINOPHEN 325 MG PO TABS: 650.0000 mg | ORAL_TABLET | ORAL | Status: DC | PRN

## 2013-02-06 MED ORDER — LIRAGLUTIDE 18 MG/3ML ~~LOC~~ SOPN: 1.8000 mg | PEN_INJECTOR | Freq: Every day | SUBCUTANEOUS | Status: DC

## 2013-02-06 MED ORDER — THROMBIN 5000 UNITS EX SOLR
CUTANEOUS | Status: AC
  Filled 2013-02-06: qty 10000

## 2013-02-06 MED ORDER — ZOLPIDEM TARTRATE 5 MG PO TABS
5.0000 mg | ORAL_TABLET | Freq: Every evening | ORAL | Status: DC | PRN
  Administered 2013-02-06: 5 mg via ORAL
  Filled 2013-02-06: qty 1

## 2013-02-06 MED ORDER — GLYCOPYRROLATE 0.2 MG/ML IJ SOLN
INTRAMUSCULAR | Status: DC | PRN
  Administered 2013-02-06: .6 mg via INTRAVENOUS

## 2013-02-06 MED ORDER — ONDANSETRON HCL 4 MG/2ML IJ SOLN
INTRAMUSCULAR | Status: AC
  Filled 2013-02-06: qty 2

## 2013-02-06 MED ORDER — CHLORHEXIDINE GLUCONATE 4 % EX LIQD: 60.0000 mL | Freq: Once | CUTANEOUS | Status: DC

## 2013-02-06 MED ORDER — FENTANYL CITRATE 0.05 MG/ML IJ SOLN
INTRAMUSCULAR | Status: DC | PRN
  Administered 2013-02-06 (×5): 50 ug via INTRAVENOUS

## 2013-02-06 MED ORDER — EPHEDRINE SULFATE 50 MG/ML IJ SOLN
INTRAMUSCULAR | Status: AC
  Filled 2013-02-06: qty 1

## 2013-02-06 MED ORDER — FENTANYL CITRATE 0.05 MG/ML IJ SOLN
INTRAMUSCULAR | Status: AC
  Filled 2013-02-06: qty 5

## 2013-02-06 MED ORDER — ALUM & MAG HYDROXIDE-SIMETH 200-200-20 MG/5ML PO SUSP: 30.0000 mL | Freq: Four times a day (QID) | ORAL | Status: DC | PRN

## 2013-02-06 MED ORDER — ACETAMINOPHEN 650 MG RE SUPP: 650.0000 mg | RECTAL | Status: DC | PRN

## 2013-02-06 MED ORDER — BUPIVACAINE-EPINEPHRINE PF 0.5-1:200000 % IJ SOLN
INTRAMUSCULAR | Status: AC
  Filled 2013-02-06: qty 30

## 2013-02-06 MED ORDER — PHENOL 1.4 % MT LIQD: 1.0000 | OROMUCOSAL | Status: DC | PRN

## 2013-02-06 MED ORDER — SODIUM CHLORIDE 0.9 % IR SOLN
Status: DC | PRN
  Administered 2013-02-06: 12:00:00

## 2013-02-06 MED ORDER — BISACODYL 5 MG PO TBEC
5.0000 mg | DELAYED_RELEASE_TABLET | Freq: Every day | ORAL | Status: DC | PRN
Start: 2013-02-06 — End: 2013-02-07

## 2013-02-06 MED ORDER — SUCCINYLCHOLINE CHLORIDE 20 MG/ML IJ SOLN
INTRAMUSCULAR | Status: AC
  Filled 2013-02-06: qty 1

## 2013-02-06 MED ORDER — HYDROCODONE-ACETAMINOPHEN 5-325 MG PO TABS
1.0000 | ORAL_TABLET | ORAL | Status: DC | PRN
  Administered 2013-02-07: 2 via ORAL
  Filled 2013-02-06 (×2): qty 2

## 2013-02-06 MED ORDER — HYDROMORPHONE HCL PF 1 MG/ML IJ SOLN
0.2500 mg | INTRAMUSCULAR | Status: DC | PRN
  Administered 2013-02-06 (×4): 0.5 mg via INTRAVENOUS

## 2013-02-06 MED ORDER — POLYETHYLENE GLYCOL 3350 17 G PO PACK: 17.0000 g | PACK | Freq: Every day | ORAL | Status: DC | PRN

## 2013-02-06 MED ORDER — MENTHOL 3 MG MT LOZG
1.0000 | LOZENGE | OROMUCOSAL | Status: DC | PRN
Start: 2013-02-06 — End: 2013-02-07

## 2013-02-06 MED ORDER — PROPOFOL 10 MG/ML IV BOLUS
INTRAVENOUS | Status: DC | PRN
  Administered 2013-02-06: 200 mg via INTRAVENOUS

## 2013-02-06 MED ORDER — INSULIN ASPART 100 UNIT/ML ~~LOC~~ SOLN: 0.0000 [IU] | Freq: Every day | SUBCUTANEOUS | Status: DC

## 2013-02-06 MED ORDER — MIDAZOLAM HCL 5 MG/5ML IJ SOLN
INTRAMUSCULAR | Status: DC | PRN
  Administered 2013-02-06 (×2): 1 mg via INTRAVENOUS

## 2013-02-06 MED ORDER — METHOCARBAMOL 100 MG/ML IJ SOLN
500.0000 mg | Freq: Four times a day (QID) | INTRAVENOUS | Status: DC | PRN
  Administered 2013-02-06: 500 mg via INTRAVENOUS
  Filled 2013-02-06: qty 5

## 2013-02-06 MED ORDER — CEFAZOLIN SODIUM-DEXTROSE 2-3 GM-% IV SOLR
INTRAVENOUS | Status: AC
  Filled 2013-02-06: qty 50

## 2013-02-06 MED ORDER — CEFAZOLIN SODIUM-DEXTROSE 2-3 GM-% IV SOLR
2.0000 g | INTRAVENOUS | Status: AC
  Administered 2013-02-06: 2 g via INTRAVENOUS

## 2013-02-06 MED ORDER — SODIUM CHLORIDE 0.9 % IJ SOLN: 3.0000 mL | INTRAMUSCULAR | Status: DC | PRN

## 2013-02-06 MED ORDER — LIDOCAINE HCL (CARDIAC) 20 MG/ML IV SOLN
INTRAVENOUS | Status: AC
  Filled 2013-02-06: qty 5

## 2013-02-06 MED ORDER — SODIUM CHLORIDE 0.45 % IV SOLN
INTRAVENOUS | Status: DC
  Administered 2013-02-06: 16:00:00 via INTRAVENOUS

## 2013-02-06 MED ORDER — METHOCARBAMOL 500 MG PO TABS: 500.0000 mg | ORAL_TABLET | Freq: Four times a day (QID) | ORAL | Status: DC

## 2013-02-06 MED ORDER — LACTATED RINGERS IV SOLN: INTRAVENOUS | Status: DC

## 2013-02-06 MED ORDER — CANAGLIFLOZIN 300 MG PO TABS
300.0000 mg | ORAL_TABLET | Freq: Every day | ORAL | Status: DC
  Administered 2013-02-07: 300 mg via ORAL
  Filled 2013-02-06: qty 1

## 2013-02-06 MED ORDER — CHLORHEXIDINE GLUCONATE CLOTH 2 % EX PADS: 6.0000 | MEDICATED_PAD | Freq: Once | CUTANEOUS | Status: DC

## 2013-02-06 MED ORDER — DEXAMETHASONE SODIUM PHOSPHATE 10 MG/ML IJ SOLN
INTRAMUSCULAR | Status: AC
  Filled 2013-02-06: qty 1

## 2013-02-06 MED ORDER — SODIUM CHLORIDE 0.9 % IV SOLN: 250.0000 mL | INTRAVENOUS | Status: DC

## 2013-02-06 MED ORDER — SODIUM CHLORIDE 0.9 % IJ SOLN: 3.0000 mL | Freq: Two times a day (BID) | INTRAMUSCULAR | Status: DC

## 2013-02-06 MED ORDER — PROPOFOL 10 MG/ML IV BOLUS
INTRAVENOUS | Status: AC
  Filled 2013-02-06: qty 20

## 2013-02-06 MED ORDER — DOCUSATE SODIUM 100 MG PO CAPS
100.0000 mg | ORAL_CAPSULE | Freq: Two times a day (BID) | ORAL | Status: DC
  Administered 2013-02-06 – 2013-02-07 (×2): 100 mg via ORAL

## 2013-02-06 MED ORDER — CANAGLIFLOZIN 300 MG PO TABS: 300.0000 mg | ORAL_TABLET | Freq: Every day | ORAL | Status: DC

## 2013-02-06 MED ORDER — METFORMIN HCL 500 MG PO TABS
1000.0000 mg | ORAL_TABLET | Freq: Two times a day (BID) | ORAL | Status: DC
  Administered 2013-02-07: 1000 mg via ORAL
  Filled 2013-02-06 (×3): qty 2

## 2013-02-06 MED ORDER — SODIUM CHLORIDE 0.9 % IJ SOLN
INTRAMUSCULAR | Status: AC
  Filled 2013-02-06: qty 10

## 2013-02-06 MED ORDER — CISATRACURIUM BESYLATE 20 MG/10ML IV SOLN
INTRAVENOUS | Status: AC
  Filled 2013-02-06: qty 10

## 2013-02-06 MED ORDER — ONDANSETRON HCL 4 MG/2ML IJ SOLN: 4.0000 mg | INTRAMUSCULAR | Status: DC | PRN

## 2013-02-06 MED ORDER — LIRAGLUTIDE 18 MG/3ML ~~LOC~~ SOPN: PEN_INJECTOR | Freq: Every day | SUBCUTANEOUS | Status: DC

## 2013-02-06 MED ORDER — DOCUSATE SODIUM 100 MG PO CAPS: 100.0000 mg | ORAL_CAPSULE | Freq: Two times a day (BID) | ORAL | Status: DC

## 2013-02-06 MED ORDER — PROMETHAZINE HCL 25 MG/ML IJ SOLN: 6.2500 mg | INTRAMUSCULAR | Status: DC | PRN

## 2013-02-06 MED ORDER — INSULIN ASPART 100 UNIT/ML ~~LOC~~ SOLN
0.0000 [IU] | Freq: Three times a day (TID) | SUBCUTANEOUS | Status: DC
Start: 2013-02-06 — End: 2013-02-07
  Administered 2013-02-06 – 2013-02-07 (×2): 2 [IU] via SUBCUTANEOUS

## 2013-02-06 MED ORDER — LACTATED RINGERS IV SOLN
INTRAVENOUS | Status: DC
  Administered 2013-02-06: 1000 mL via INTRAVENOUS

## 2013-02-06 MED ORDER — METFORMIN HCL 500 MG PO TABS: 1000.0000 mg | ORAL_TABLET | Freq: Two times a day (BID) | ORAL | Status: DC

## 2013-02-06 MED ORDER — CEFAZOLIN SODIUM-DEXTROSE 2-3 GM-% IV SOLR
2.0000 g | Freq: Three times a day (TID) | INTRAVENOUS | Status: AC
  Administered 2013-02-06 – 2013-02-07 (×2): 2 g via INTRAVENOUS
  Filled 2013-02-06 (×2): qty 50

## 2013-02-06 SURGICAL SUPPLY — 50 items
APL SKNCLS STERI-STRIP NONHPOA (GAUZE/BANDAGES/DRESSINGS) ×1
BAG SPEC THK2 15X12 ZIP CLS (MISCELLANEOUS) ×1
BAG ZIPLOCK 12X15 (MISCELLANEOUS) ×3 IMPLANT
BENZOIN TINCTURE PRP APPL 2/3 (GAUZE/BANDAGES/DRESSINGS) ×3 IMPLANT
CHLORAPREP W/TINT 26ML (MISCELLANEOUS) IMPLANT
CLEANER TIP ELECTROSURG 2X2 (MISCELLANEOUS) ×3 IMPLANT
CLOSURE WOUND 1/2 X4 (GAUZE/BANDAGES/DRESSINGS) ×1
CLOTH 2% CHLOROHEXIDINE 3PK (PERSONAL CARE ITEMS) ×3 IMPLANT
DECANTER SPIKE VIAL GLASS SM (MISCELLANEOUS) ×3 IMPLANT
DRAPE MICROSCOPE LEICA (MISCELLANEOUS) ×3 IMPLANT
DRAPE POUCH INSTRU U-SHP 10X18 (DRAPES) ×3 IMPLANT
DRAPE SURG 17X11 SM STRL (DRAPES) ×3 IMPLANT
DRAPE UTILITY XL STRL (DRAPES) ×3 IMPLANT
DRSG AQUACEL AG ADV 3.5X 4 (GAUZE/BANDAGES/DRESSINGS) IMPLANT
DRSG AQUACEL AG ADV 3.5X 6 (GAUZE/BANDAGES/DRESSINGS) ×2 IMPLANT
DURAPREP 26ML APPLICATOR (WOUND CARE) ×3 IMPLANT
DURASEAL SPINE SEALANT 3ML (MISCELLANEOUS) ×3 IMPLANT
ELECT REM PT RETURN 9FT ADLT (ELECTROSURGICAL) ×3
ELECTRODE REM PT RTRN 9FT ADLT (ELECTROSURGICAL) ×1 IMPLANT
GLOVE BIOGEL PI IND STRL 7.5 (GLOVE) ×1 IMPLANT
GLOVE BIOGEL PI INDICATOR 7.5 (GLOVE) ×2
GLOVE SURG SS PI 7.5 STRL IVOR (GLOVE) ×3 IMPLANT
GLOVE SURG SS PI 8.0 STRL IVOR (GLOVE) ×6 IMPLANT
GOWN STRL REUS W/TWL XL LVL3 (GOWN DISPOSABLE) ×6 IMPLANT
IV CATH 14GX2 1/4 (CATHETERS) ×3 IMPLANT
KIT BASIN OR (CUSTOM PROCEDURE TRAY) ×3 IMPLANT
KIT POSITIONING SURG ANDREWS (MISCELLANEOUS) ×3 IMPLANT
MANIFOLD NEPTUNE II (INSTRUMENTS) ×3 IMPLANT
NDL SPNL 18GX3.5 QUINCKE PK (NEEDLE) ×3 IMPLANT
NEEDLE SPNL 18GX3.5 QUINCKE PK (NEEDLE) ×9 IMPLANT
PATTIES SURGICAL .5 X.5 (GAUZE/BANDAGES/DRESSINGS) IMPLANT
PATTIES SURGICAL .75X.75 (GAUZE/BANDAGES/DRESSINGS) IMPLANT
PATTIES SURGICAL 1X1 (DISPOSABLE) IMPLANT
SPONGE LAP 4X18 X RAY DECT (DISPOSABLE) ×2 IMPLANT
SPONGE SURGIFOAM ABS GEL 100 (HEMOSTASIS) ×3 IMPLANT
STRIP CLOSURE SKIN 1/2X4 (GAUZE/BANDAGES/DRESSINGS) ×2 IMPLANT
SUT NURALON 4 0 TR CR/8 (SUTURE) ×3 IMPLANT
SUT PROLENE 3 0 PS 2 (SUTURE) ×3 IMPLANT
SUT VIC AB 1 CT1 27 (SUTURE)
SUT VIC AB 1 CT1 27XBRD ANTBC (SUTURE) IMPLANT
SUT VIC AB 1-0 CT2 27 (SUTURE) IMPLANT
SUT VIC AB 2-0 CT1 27 (SUTURE)
SUT VIC AB 2-0 CT1 TAPERPNT 27 (SUTURE) IMPLANT
SUT VIC AB 2-0 CT2 27 (SUTURE) ×2 IMPLANT
SYRINGE 10CC LL (SYRINGE) ×3 IMPLANT
TOWEL OR 17X26 10 PK STRL BLUE (TOWEL DISPOSABLE) ×3 IMPLANT
TOWEL OR NON WOVEN STRL DISP B (DISPOSABLE) ×3 IMPLANT
TRAY FOLEY CATH 16FR SILVER (SET/KITS/TRAYS/PACK) ×2 IMPLANT
TRAY LAMINECTOMY (CUSTOM PROCEDURE TRAY) ×3 IMPLANT
YANKAUER SUCT BULB TIP NO VENT (SUCTIONS) ×3 IMPLANT

## 2013-02-06 NOTE — Interval H&P Note (Signed)
History and Physical Interval Note:  02/06/2013 8:28 AM  Lilla ShookFranklin W Trudell  has presented today for surgery, with the diagnosis of stenosis L5 - S1 and L4 - L5  The various methods of treatment have been discussed with the patient and family. After consideration of risks, benefits and other options for treatment, the patient has consented to  Procedure(s): MICRO LUMBAR DECOMPRESSION L4 - L5 POSSIBLE L3 - L4 2 LEVELS (N/A) as a surgical intervention .  The patient's history has been reviewed, patient examined, no change in status, stable for surgery.  I have reviewed the patient's chart and labs.  Questions were answered to the patient's satisfaction.     Fumiko Cham C

## 2013-02-06 NOTE — Discharge Instructions (Signed)
Walk As Tolerated utilizing back precautions.  No bending, twisting, or lifting.  No driving for 2 weeks.   °Aquacel dressing may remain in place for 7 days. May shower with aquacel dressing in place. After 7 days, remove aquacel dressing and place gauze and tape dressing which should be kept clean and dry and changed daily. Do not remove steri-strips if they are present. °See Dr. Beane in office in 10 to 14 days. Begin taking aspirin 81mg per day starting 4 days after your surgery if not allergic to aspirin or on another blood thinner. °Walk daily even outside. Use a cane or walker only if necessary. °Avoid sitting on soft sofas. ° °

## 2013-02-06 NOTE — Preoperative (Signed)
Beta Blockers   Reason not to administer Beta Blockers:Not Applicable 

## 2013-02-06 NOTE — H&P (View-Only) (Signed)
Adam Petty is an 52 y.o. male.   Chief Complaint: back and leg pain HPI: The patient is a 52 year old male who presents today for follow up of their back. The patient is being followed for their low back symptoms. They are now 23 month(s) out from injury. Symptoms reported today include: pain. The patient states that they are doing poorly. Current treatment includes: activity modification and pain medications. The following medication has been used for pain control: Oxycodone (prescribed by pain management). The patient reports their current pain level to be moderate to severe. The patient indicates that they have questions or concerns today regarding surgery. Note for "Follow-up back": The patient is currently working light duty with a 10lb. lifting restriction, no prolonged sitting or standing, and no repetitive bending.   Apparently surgery has been approved. He continues to have severe right lower extremity radicular pain. He also has some pain into the left.  Past Medical History  Diagnosis Date  . Back pain   . IBS (irritable bowel syndrome)   . History of kidney stones   . Diabetes mellitus without complication   . Preventative health care     takes crestor/ altace for "preventative reasons" denies heart or BP problems    Past Surgical History  Procedure Laterality Date  . Tonsillectomy    . Shoulder surgery      x5 on RT / x1 LFT  . Palate surgery      surg for sleep apnea and sinus issues  . Cervical fusion    . Foot surgery      RT    No family history on file. Social History:  reports that he has never smoked. He does not have any smokeless tobacco history on file. He reports that he drinks alcohol. He reports that he does not use illicit drugs.  Allergies: No Known Allergies   (Not in a hospital admission)  Results for orders placed during the hospital encounter of 01/31/13 (from the past 48 hour(s))  SURGICAL PCR SCREEN     Status: None   Collection Time     01/31/13  2:07 PM      Result Value Range   MRSA, PCR NEGATIVE  NEGATIVE   Staphylococcus aureus NEGATIVE  NEGATIVE   Comment:            The Xpert SA Assay (FDA     approved for NASAL specimens     in patients over 21 years of age),     is one component of     a comprehensive surveillance     program.  Test performance has     been validated by [facility] for patients greater     than or equal to 1 year old.     It is not intended     to diagnose infection nor to     guide or monitor treatment.  CBC     Status: None   Collection Time    01/31/13  2:10 PM      Result Value Range   WBC 8.7  4.0 - 10.5 K/uL   RBC 5.30  4.22 - 5.81 MIL/uL   Hemoglobin 15.4  13.0 - 17.0 g/dL   HCT 45.0  39.0 - 52.0 %   MCV 84.9  78.0 - 100.0 fL   MCH 29.1  26.0 - 34.0 pg   MCHC 34.2  30.0 - 36.0 g/dL   RDW 13.5  11.5 -   15.5 %   Platelets 198  150 - 400 K/uL  BASIC METABOLIC PANEL     Status: Abnormal   Collection Time    01/31/13  2:10 PM      Result Value Range   Sodium 139  137 - 147 mEq/L   Potassium 5.2  3.7 - 5.3 mEq/L   Chloride 100  96 - 112 mEq/L   CO2 25  19 - 32 mEq/L   Glucose, Bld 154 (*) 70 - 99 mg/dL   BUN 16  6 - 23 mg/dL   Creatinine, Ser 0.93  0.50 - 1.35 mg/dL   Calcium 9.9  8.4 - 10.5 mg/dL   GFR calc non Af Amer >90  >90 mL/min   GFR calc Af Amer >90  >90 mL/min   Comment: (NOTE)     The eGFR has been calculated using the CKD EPI equation.     This calculation has not been validated in all clinical situations.     eGFR's persistently <90 mL/min signify possible Chronic Kidney     Disease.   Dg Chest 2 View  01/31/2013   CLINICAL DATA:  Preop lumbar spine surgery  EXAM: CHEST  2 VIEW  COMPARISON:  None.  FINDINGS: The heart size and mediastinal contours are within normal limits. Both lungs are clear. The visualized skeletal structures are unremarkable.  IMPRESSION: No active cardiopulmonary disease.   Electronically Signed   By: Raymond  Rubner M.D.   On:  01/31/2013 15:16   X-ray Lumbar Spine Ap And Lateral  01/31/2013   CLINICAL DATA:  PREOP LUMBAR DECOMPRESSION  EXAM: LUMBAR SPINE - 2-3 VIEW  COMPARISON:  None.  FINDINGS: There are 5 nonrib bearing lumbar-type vertebral bodies. The vertebral body heights are maintained. The alignment is anatomic. There is no spondylolysis. There is no acute fracture or static listhesis. There is severe degenerative disc disease at L4-5 with facet arthropathy.  The SI joints are unremarkable.  IMPRESSION: Severe degenerative disc disease at L4-5.   Electronically Signed   By: Hetal  Patel   On: 01/31/2013 16:06    Review of Systems  Constitutional: Negative.   HENT: Negative.   Eyes: Negative.   Respiratory: Negative.   Cardiovascular: Negative.   Gastrointestinal: Negative.   Genitourinary: Negative.   Musculoskeletal: Positive for back pain.  Skin: Negative.   Neurological: Positive for focal weakness.  Psychiatric/Behavioral: Negative.     There were no vitals taken for this visit. Physical Exam  Constitutional: He is oriented to person, place, and time. He appears well-developed and well-nourished.  HENT:  Head: Normocephalic and atraumatic.  Eyes: Conjunctivae and EOM are normal. Pupils are equal, round, and reactive to light.  Neck: Normal range of motion. Neck supple.  Cardiovascular: Normal rate and regular rhythm.   Respiratory: Effort normal and breath sounds normal.  GI: Soft. Bowel sounds are normal.  Musculoskeletal:  On exam, he's upright. SLR continues to produce buttock, thigh and calf pain exacerbated with dorsal augmentation maneuver. EHL is 5-/5 as is his quad. Contralateral SLR produces buttock pain. He has no pain in the back with flexion or extension.  Lumbar spine exam reveals no evidence of soft tissue swelling, ecchymosis or deformity. The abdomen is soft and nontender. Nontender over the trochanters. No cellulitis or lymphadenopathy.  Motor is 5/5 including tibialis  anterior, plantar flexion, quadriceps and hamstrings. Patient is normoreflexic. There is no Babinski or clonus. Sensory exam is intact to light touch. The patient has good distal pulses. No   DVT. No pain and normal range of motion without instability of the hips, knees and ankles.  Inspection of the cervical spine reveals a normal lordosis without evidence of paraspinous spasms or soft tissue swelling. Nontender to palpation. Full flexion, full extension, full left and right lateral rotation. Extension combined with lateral flexion does not reproduce pain. Negative impingement sign, negative secondary impingement sign of the shoulders. Negative Tinel's at median and ulnar nerves at the elbow. Negative carpal compression test at the wrists. Motor of the upper extremities is 5/5 including biceps, triceps, brachioradialis,wrist flexion, wrist extension, finger flexion, finger extension. Reflexes are normoreflexic. Sensory exam is intact to light touch. There is no Hoffmann sign. Nontender over the thoracic spine.  Neurological: He is alert and oriented to person, place, and time. He has normal reflexes.  Skin: Skin is warm and dry.  Psychiatric: He has a normal mood and affect.    MRI demonstrates disc herniation, severe lateral recess stenosis at 4-5, neural foraminal stenosis. No instability in flexion or extension.  His x-rays demonstrate the defect in the endplate of 5.  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  As previously discussed, we discussed lumbar decompression at 4-5, laminectomy and bilateral foraminotomies of L4 and L5 without a concomitant fusion.  Risks and benefits of this procedure were discussed with the patient including worsening of symptoms, no changes in symptoms, recurrent disc herniation, scar tissue,  epidural fibrosis, damage to neurovascular structures, cerebral spinal fluid leak which would require repair or patching, DVT, PE, anesthetic complications, etc. We discussed the perioperative course, the hospitalization, and the need for postoperative rehabilitation and the time estimate for recovery. We also discussed the possibility of future surgery including repeat decompression, fusion. The patient was provided an illustrated handout which was discussed in detail. Appropriate anatomic models were used as well.  No history of MRSA.  Plan microlumbar decompression L4-5, possible L3-4  Jaymason Ledesma M. for Dr. Beane 01/31/2013, 9:10 PM    

## 2013-02-06 NOTE — Anesthesia Procedure Notes (Signed)
Procedure Name: Intubation Date/Time: 02/06/2013 11:52 AM Performed by: Edison PaceGRAY, Wyvonne Carda E Pre-anesthesia Checklist: Patient identified, Timeout performed, Suction available, Emergency Drugs available and Patient being monitored Patient Re-evaluated:Patient Re-evaluated prior to inductionOxygen Delivery Method: Circle system utilized Preoxygenation: Pre-oxygenation with 100% oxygen Intubation Type: IV induction Ventilation: Two handed mask ventilation required Laryngoscope Size: Mac and 4 Grade View: Grade III Tube type: Glide rite Tube size: 7.5 mm Number of attempts: 1 Airway Equipment and Method: Video-laryngoscopy and Oral airway (elective glidescope) Placement Confirmation: ETT inserted through vocal cords under direct vision,  breath sounds checked- equal and bilateral and positive ETCO2 Secured at: 21 cm Tube secured with: Tape Dental Injury: Teeth and Oropharynx as per pre-operative assessment  Difficulty Due To: Difficulty was anticipated, Difficult Airway- due to large tongue, Difficult Airway- due to reduced neck mobility, Difficult Airway- due to limited oral opening and Difficult Airway- due to anterior larynx Future Recommendations: Recommend- induction with short-acting agent, and alternative techniques readily available

## 2013-02-06 NOTE — Anesthesia Preprocedure Evaluation (Signed)
Anesthesia Evaluation  Patient identified by MRN, date of birth, ID band Patient awake    Reviewed: Allergy & Precautions, H&P , NPO status , Patient's Chart, lab work & pertinent test results  Airway Mallampati: III TM Distance: >3 FB Neck ROM: Full    Dental  (+) Teeth Intact and Dental Advisory Given   Pulmonary neg pulmonary ROS,  breath sounds clear to auscultation  Pulmonary exam normal       Cardiovascular negative cardio ROS  Rhythm:Regular Rate:Normal     Neuro/Psych negative neurological ROS  negative psych ROS   GI/Hepatic negative GI ROS, Neg liver ROS,   Endo/Other  diabetes, Well Controlled, Type 2, Oral Hypoglycemic AgentsMorbid obesity  Renal/GU negative Renal ROS  negative genitourinary   Musculoskeletal negative musculoskeletal ROS (+)   Abdominal   Peds  Hematology negative hematology ROS (+)   Anesthesia Other Findings   Reproductive/Obstetrics                           Anesthesia Physical Anesthesia Plan  ASA: III  Anesthesia Plan: General   Post-op Pain Management:    Induction: Intravenous  Airway Management Planned: Oral ETT  Additional Equipment:   Intra-op Plan:   Post-operative Plan: Extubation in OR  Informed Consent: I have reviewed the patients History and Physical, chart, labs and discussed the procedure including the risks, benefits and alternatives for the proposed anesthesia with the patient or authorized representative who has indicated his/her understanding and acceptance.   Dental advisory given  Plan Discussed with: CRNA  Anesthesia Plan Comments:         Anesthesia Quick Evaluation

## 2013-02-06 NOTE — Progress Notes (Signed)
Note presence of abrasion to left forehead thought secondary to BIS removal on arrival in PACU. Patient made aware.

## 2013-02-06 NOTE — Brief Op Note (Signed)
02/06/2013  1:26 PM  PATIENT:  Adam Petty  53 y.o. male  PRE-OPERATIVE DIAGNOSIS:  stenosis L5 - S1 and L4 - L5  POST-OPERATIVE DIAGNOSIS:  stenosis L5 - S1 and L4 - L5  PROCEDURE:  Procedure(s): MICRO LUMBAR DECOMPRESSION L3-L4 and L4 - L5  (N/A)  SURGEON:  Surgeon(s) and Role:    * Javier DockerJeffrey C Francess Mullen, MD - Primary    * Drucilla SchmidtJames P Aplington, MD - Assisting  PHYSICIAN ASSISTANT:   ASSISTANTS: Aplington   ANESTHESIA:   general  EBL:  Total I/O In: 1000 [I.V.:1000] Out: 275 [Urine:275]  BLOOD ADMINISTERED:none  DRAINS: none   LOCAL MEDICATIONS USED:  MARCAINE     SPECIMEN:  No Specimen and Excision  DISPOSITION OF SPECIMEN:  N/A  COUNTS:  YES  TOURNIQUET:  * No tourniquets in log *  DICTATION: .Other Dictation: Dictation Number [id number]  PLAN OF CARE: Admit for overnight observation  PATIENT DISPOSITION:  PACU - hemodynamically stable.   Delay start of Pharmacological VTE agent (>24hrs) due to surgical blood loss or risk of bleeding: yes

## 2013-02-06 NOTE — Anesthesia Postprocedure Evaluation (Signed)
Anesthesia Post Note  Patient: Adam Petty  Procedure(s) Performed: Procedure(s) (LRB): MICRO LUMBAR DECOMPRESSION L3-L4 and L4 - L5  (N/A)  Anesthesia type: General  Patient location: PACU  Post pain: Pain level controlled  Post assessment: Post-op Vital signs reviewed  Last Vitals:  Filed Vitals:   02/06/13 1445  BP: 144/78  Pulse: 68  Temp: 36.5 C  Resp: 12    Post vital signs: Reviewed  Level of consciousness: sedated  Complications: No apparent anesthesia complications

## 2013-02-06 NOTE — Transfer of Care (Signed)
Immediate Anesthesia Transfer of Care Note  Patient: Adam Petty  Procedure(s) Performed: Procedure(s): MICRO LUMBAR DECOMPRESSION L3-L4 and L4 - L5  (N/A)  Patient Location: PACU  Anesthesia Type:General  Level of Consciousness: awake, oriented, patient cooperative, lethargic and responds to stimulation  Airway & Oxygen Therapy: Patient Spontanous Breathing and Patient connected to face mask oxygen  Post-op Assessment: Report given to PACU RN, Post -op Vital signs reviewed and stable and Patient moving all extremities  Post vital signs: Reviewed and stable  Complications: No apparent anesthesia complications

## 2013-02-07 LAB — BASIC METABOLIC PANEL
BUN: 15 mg/dL (ref 6–23)
CO2: 26 mEq/L (ref 19–32)
Calcium: 8.4 mg/dL (ref 8.4–10.5)
Chloride: 99 mEq/L (ref 96–112)
Creatinine, Ser: 0.91 mg/dL (ref 0.50–1.35)
GFR calc Af Amer: >90 mL/min (ref >90)
GLUCOSE: 174 mg/dL — AB (ref 70–99)
Potassium: 4.7 mEq/L (ref 3.7–5.3)
SODIUM: 135 meq/L — AB (ref 137–147)

## 2013-02-07 LAB — CBC
HCT: 41.9 % (ref 39.0–52.0)
Hemoglobin: 13.8 g/dL (ref 13.0–17.0)
MCH: 28.5 pg (ref 26.0–34.0)
MCHC: 32.9 g/dL (ref 30.0–36.0)
MCV: 86.4 fL (ref 78.0–100.0)
Platelets: 186 10*3/uL (ref 150–400)
RBC: 4.85 MIL/uL (ref 4.22–5.81)
RDW: 13.6 % (ref 11.5–15.5)
WBC: 10.1 10*3/uL (ref 4.0–10.5)

## 2013-02-07 LAB — GLUCOSE, CAPILLARY: Glucose-Capillary: 148 mg/dL — ABNORMAL HIGH (ref 70–99)

## 2013-02-07 MED ORDER — INFLUENZA VAC SPLIT QUAD 0.5 ML IM SUSP: 0.5000 mL | INTRAMUSCULAR | Status: DC | PRN

## 2013-02-07 MED ORDER — PNEUMOCOCCAL VAC POLYVALENT 25 MCG/0.5ML IJ INJ: 0.5000 mL | INJECTION | INTRAMUSCULAR | Status: DC | PRN

## 2013-02-07 NOTE — Care Management Note (Signed)
    Page 1 of 1   02/07/2013     11:46:19 AM   CARE MANAGEMENT NOTE 02/07/2013  Patient:  Adam ShookBRIDGES,Dolan W   Account Number:  [id number]  Date Initiated:  02/07/2013  Documentation initiated by:  Colleen CanMANNING,Shedric Fredericks  Subjective/Objective Assessment:   dx Spinal stenosis 5-S1 and L4-L5; Central gill laminectomy L4 with bilateral foraminotomies, decompression L3-4.    Worker's Comp claim     Action/Plan:   CM spoke with patient and spouse. Plans are for [place] where spouse will be caregiver. There are no HH or DME needs noted.   Anticipated DC Date:  02/07/2013   Anticipated DC Plan:  HOME/SELF CARE      DC Planning Services  CM consult      Choice offered to / List presented to:             Status of service:  Completed, signed off Medicare Important Message given?   (If response is "NO", the following Medicare IM given date fields will be blank) Date Medicare IM given:   Date Additional Medicare IM given:    Discharge Disposition:  HOME/SELF CARE  Per UR Regulation:    If discussed at Long Length of Stay Meetings, dates discussed:    Comments:

## 2013-02-07 NOTE — Evaluation (Signed)
Occupational Therapy Evaluation Patient Details Name: Adam Petty MRN: [id number] DOB: 09/14/1960 Today's Date: 02/07/2013 Time: [id number] OT Time Calculation (min): 9 min  OT Assessment / Plan / Recommendation History of present illness pt hurt back 23 months ago and is s/p L4-5 decompression   Clinical Impression   Pt was admitted for the above. All education was completed regarding adls and back precautions.  PT does not need any further OT at this time.      OT Assessment  Patient does not need any further OT services    Follow Up Recommendations  No OT follow up    Barriers to Discharge      Equipment Recommendations  None recommended by OT    Recommendations for Other Services    Frequency       Precautions / Restrictions Precautions Precautions: Back Restrictions Weight Bearing Restrictions: No   Pertinent Vitals/Pain Back sore.  Repositioned    ADL  Toilet Transfer: Simulated;Supervision/safety Toilet Transfer Method: Sit to stand Transfers/Ambulation Related to ADLs: ambulating at supervision level.  Pt does not feel he will have difficulty accessing tub/shower ADL Comments: pt bent forward to tie shoes prior surgery.  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  Educated on toilet aide.  Pt feels he will be OK without this and also feels like his bathroom set up will work for him.  Reviewed sidestepping into tub.   Overall needs set up for UB adls and mod A with LB adls.  Also reviewed alternative positions for adls and gave handout    OT Diagnosis:    OT Problem List:   OT Treatment Interventions:     OT Goals(Current goals can be found in the care plan section)    Visit Information  Last OT Received On: 02/07/13 History of Present Illness: pt hurt back 23 months ago and is s/p L4-5 decompression       Prior Functioning     Home Living Family/patient expects to be discharged to:: Private residence Living Arrangements: Spouse/significant  other Home Equipment: Grab bars - tub/shower Prior Function Level of Independence: Independent Communication Communication: No difficulties         Vision/Perception     Cognition  Cognition Arousal/Alertness: Awake/alert Behavior During Therapy: WFL for tasks assessed/performed Overall Cognitive Status: Within Functional Limits for tasks assessed    Extremity/Trunk Assessment Upper Extremity Assessment Upper Extremity Assessment: Overall WFL for tasks assessed     Mobility       Exercise     Balance     End of Session OT - End of Session Activity Tolerance: Patient tolerated treatment well Patient left: in chair;with call bell/phone within reach;with family/visitor present  GO Functional Assessment Tool Used: clinical judgment Functional Limitation: Self care Self Care Current Status ([id number]): At least 40 percent but less than 60 percent impaired, limited or restricted Self Care Goal Status ([id number]([id number]): At least 40 percent but less than 60 percent impaired, limited or restricted Self Care Discharge Status [phone](G8989): At least 40 percent but less than 60 percent impaired, limited or restricted   Kewanna Kasprzak 02/07/2013, 10:14 AM Marica OtterMaryellen Carsten Carstarphen, OTR/L [phone] 02/07/2013

## 2013-02-07 NOTE — Discharge Summary (Signed)
Physician Discharge Summary   Patient ID: IBRAHEM VOLKMAN MRN: [id number] DOB/AGE: 1960-12-14 53 y.o.  Admit date: 02/06/2013 Discharge date: 02/07/2013  Primary Diagnosis:   stenosis L5 - S1 and L4 - L5  Admission Diagnoses:  Past Medical History  Diagnosis Date  . Back pain   . IBS (irritable bowel syndrome)   . History of kidney stones   . Diabetes mellitus without complication   . Preventative health care     takes crestor/ altace for "preventative reasons" denies heart or BP problems   Discharge Diagnoses:   Active Problems:   Spinal stenosis of lumbar region  Procedure:  Procedure(s) (LRB): MICRO LUMBAR DECOMPRESSION L3-L4 and L4 - L5  (N/A)   Consults: None  HPI:  see H&P    Laboratory Data: Hospital Outpatient Visit on 01/31/2013  Component Date Value Range Status  . WBC 01/31/2013 8.7  4.0 - 10.5 K/uL Final  . RBC 01/31/2013 5.30  4.22 - 5.81 MIL/uL Final  . Hemoglobin 01/31/2013 15.4  13.0 - 17.0 g/dL Final  . HCT 01/31/2013 45.0  39.0 - 52.0 % Final  . MCV 01/31/2013 84.9  78.0 - 100.0 fL Final  . MCH 01/31/2013 29.1  26.0 - 34.0 pg Final  . MCHC 01/31/2013 34.2  30.0 - 36.0 g/dL Final  . RDW 01/31/2013 13.5  11.5 - 15.5 % Final  . Platelets 01/31/2013 198  150 - 400 K/uL Final  . Sodium 01/31/2013 139  137 - 147 mEq/L Final  . Potassium 01/31/2013 5.2  3.7 - 5.3 mEq/L Final  . Chloride 01/31/2013 100  96 - 112 mEq/L Final  . CO2 01/31/2013 25  19 - 32 mEq/L Final  . Glucose, Bld 01/31/2013 154* 70 - 99 mg/dL Final  . BUN 01/31/2013 16  6 - 23 mg/dL Final  . Creatinine, Ser 01/31/2013 0.93  0.50 - 1.35 mg/dL Final  . Calcium 01/31/2013 9.9  8.4 - 10.5 mg/dL Final  . GFR calc non Af Amer 01/31/2013 >90  >90 mL/min Final  . GFR calc Af Amer 01/31/2013 >90  >90 mL/min Final   Comment: (NOTE)                          The eGFR has been calculated using the CKD EPI equation.                          This calculation has not been validated in all  clinical situations.                          eGFR's persistently <90 mL/min signify possible Chronic Kidney                          Disease.  Marland Kitchen MRSA, PCR 01/31/2013 NEGATIVE  NEGATIVE Final  . Staphylococcus aureus 01/31/2013 NEGATIVE  NEGATIVE Final   Comment:                                 The Xpert SA Assay (FDA                          approved for NASAL specimens  in patients over 64 years of age),                          is one component of                          a comprehensive surveillance                          program.  Test performance has                          been validated by [employer] for patients greater                          than or equal to 29 year old.                          It is not intended                          to diagnose infection nor to                          guide or monitor treatment.    Recent Labs  02/07/13 0518  HGB 13.8    Recent Labs  02/07/13 0518  WBC 10.1  RBC 4.85  HCT 41.9  PLT 186    Recent Labs  02/07/13 0518  NA 135*  K 4.7  CL 99  CO2 26  BUN 15  CREATININE 0.91  GLUCOSE 174*  CALCIUM 8.4   No results found for this basename: LABPT, INR,  in the last 72 hours  X-Rays:Dg Chest 2 View  01/31/2013   CLINICAL DATA:  Preop lumbar spine surgery  EXAM: CHEST  2 VIEW  COMPARISON:  None.  FINDINGS: The heart size and mediastinal contours are within normal limits. Both lungs are clear. The visualized skeletal structures are unremarkable.  IMPRESSION: No active cardiopulmonary disease.   Electronically Signed   By: Skipper Cliche M.D.   On: 01/31/2013 15:16   X-ray Lumbar Spine Ap And Lateral  01/31/2013   CLINICAL DATA:  PREOP LUMBAR DECOMPRESSION  EXAM: LUMBAR SPINE - 2-3 VIEW  COMPARISON:  None.  FINDINGS: There are 5 nonrib bearing lumbar-type vertebral bodies. The vertebral body heights are maintained. The alignment is anatomic. There is no spondylolysis.  There is no acute fracture or static listhesis. There is severe degenerative disc disease at L4-5 with facet arthropathy.  The SI joints are unremarkable.  IMPRESSION: Severe degenerative disc disease at L4-5.   Electronically Signed   By: Kathreen Devoid   On: 01/31/2013 16:06   Dg Spine Portable 1 View  02/06/2013   CLINICAL DATA:  L4-L5 decompression.  EXAM: PORTABLE SPINE - 1 VIEW  COMPARISON:  02/06/2013.  FINDINGS: Cross-table lateral view of the lumbar spine demonstrates soft tissue retractors in place posterior to L4-L5. Two surgical probes are in place, one with tip immediately posterior to the superior endplate of L4, and the other with tip posterior to the upper half of L5.  IMPRESSION:L: IMPRESSION:L  1. Intraoperative localization, as above.   Electronically Signed   By: Vinnie Langton M.D.   On: 02/06/2013 13:35   Dg Spine Portable 1 View  02/06/2013   CLINICAL DATA:  L4-5 decompression.  Localization films.  EXAM: PORTABLE SPINE - 1 VIEW  COMPARISON:  Intraoperative localization film 02/06/2013 at 12:12 p.m.  FINDINGS: Probes are identified levels of the L4 and L5 pedicles.  IMPRESSION: Localization as above.   Electronically Signed   By: Inge Rise M.D.   On: 02/06/2013 12:59   Dg Spine Portable 1 View  02/06/2013   CLINICAL DATA:  L4-5 decompression. Intraoperative localization film.  EXAM: PORTABLE SPINE - 1 VIEW  COMPARISON:  Post myelogram CT scan 12/22/2009 and plain films lumbar spine 01/2013 soft 2015.  FINDINGS: Two probes are identified. The more superior is at the level of the inferior aspect of the L4 pedicles. A second probe is at the level of the L5 pedicles.  IMPRESSION: Localization as above.   Electronically Signed   By: Inge Rise M.D.   On: 02/06/2013 12:58    EKG:No orders found for this or any previous visit.   Hospital Course: Patient was admitted to [facility] and taken to the OR and underwent the above state procedure without complications.   Patient tolerated the procedure well and was later transferred to the recovery room and then to the orthopaedic floor for postoperative care.  They were given PO and IV analgesics for pain control following their surgery.  They were given 24 hours of postoperative antibiotics.   PT was consulted postop to assist with mobility and transfers.  The patient was allowed to be WBAT with therapy and was taught back precautions. Discharge planning was consulted to help with postop disposition and equipment needs.  Patient had a good night on the evening of surgery and started to get up OOB with therapy on day one. Patient was seen in rounds and was ready to go home on day one.  They were given discharge instructions and dressing directions.  They were instructed on when to follow up in the office with Dr. Tonita Cong.  Discharge Medications: Prior to Admission medications   Medication Sig Start Date End Date Taking? Authorizing Provider  acetaminophen (TYLENOL) 500 MG tablet Take 500 mg by mouth every 6 (six) hours as needed for mild pain or moderate pain.   Yes Historical Provider, MD  Canagliflozin (INVOKANA) 300 MG TABS Take 300 mg by mouth daily.   Yes Historical Provider, MD  LIRAGLUTIDE [place] Inject 1.8 mg into the skin daily. As directed.   Yes Historical Provider, MD  metFORMIN (GLUCOPHAGE) 1000 MG tablet Take 1,000 mg by mouth 2 (two) times daily with a meal.   Yes Historical Provider, MD  oxycodone (OXY-IR) 5 MG capsule Take 5 mg by mouth 3 (three) times daily.   Yes Historical Provider, MD  ramipril (ALTACE) 5 MG capsule Take 5 mg by mouth daily with breakfast.   Yes Historical Provider, MD  rosuvastatin (CRESTOR) 10 MG tablet Take 10 mg by mouth 3 (three) times a week.    Yes Historical Provider, MD  docusate sodium (COLACE) 100 MG capsule Take 1 capsule (100 mg total) by mouth 2 (two) times daily. 02/06/13   Conley Rolls. Bissell, PA-C  methocarbamol (ROBAXIN) 500 MG tablet Take 1 tablet (500 mg total) by mouth  4 (four) times daily. 02/06/13   Conley Rolls. Bissell, PA-C  oxyCODONE (OXY IR/ROXICODONE) 5 MG immediate release tablet Take  1-2 tablets (5-10 mg total) by mouth every 4 (four) hours as needed for severe pain. 02/06/13   Conley Rolls. Phoebe Sharps, PA-C    Diet: Diabetic diet Activity:WBAT, Lspine precautions Follow-up:in 10-14 days Disposition - Home Discharged Condition: good   Discharge Orders   Future Orders Complete By Expires   Call MD / Call 911  As directed    Comments:     If you experience chest pain or shortness of breath, CALL 911 and be transported to the hospital emergency room.  If you develope a fever above 101 F, pus (white drainage) or increased drainage or redness at the wound, or calf pain, call your surgeon's office.   Constipation Prevention  As directed    Comments:     Drink plenty of fluids.  Prune juice may be helpful.  You may use a stool softener, such as Colace (over the counter) 100 mg twice a day.  Use MiraLax (over the counter) for constipation as needed.   Diet - low sodium heart healthy  As directed    Increase activity slowly as tolerated  As directed        Medication List    STOP taking these medications       oxycodone 5 MG capsule  Commonly known as:  OXY-IR  Replaced by:  oxyCODONE 5 MG immediate release tablet      TAKE these medications       acetaminophen 500 MG tablet  Commonly known as:  TYLENOL  Take 500 mg by mouth every 6 (six) hours as needed for mild pain or moderate pain.     docusate sodium 100 MG capsule  Commonly known as:  COLACE  Take 1 capsule (100 mg total) by mouth 2 (two) times daily.     INVOKANA 300 MG Tabs  Generic drug:  Canagliflozin  Take 300 mg by mouth daily.     LIRAGLUTIDE [place]  Inject 1.8 mg into the skin daily. As directed.     metFORMIN 1000 MG tablet  Commonly known as:  GLUCOPHAGE  Take 1,000 mg by mouth 2 (two) times daily with a meal.     methocarbamol 500 MG tablet  Commonly known as:  ROBAXIN  Take  1 tablet (500 mg total) by mouth 4 (four) times daily.     oxyCODONE 5 MG immediate release tablet  Commonly known as:  Oxy IR/ROXICODONE  Take 1-2 tablets (5-10 mg total) by mouth every 4 (four) hours as needed for severe pain.     ramipril 5 MG capsule  Commonly known as:  ALTACE  Take 5 mg by mouth daily with breakfast.     rosuvastatin 10 MG tablet  Commonly known as:  CRESTOR  Take 10 mg by mouth 3 (three) times a week.           Follow-up Information   Follow up with BEANE,JEFFREY C, MD In 2 weeks.   Specialty:  Orthopedic Surgery   Contact information:   [address] [place] [address] [phone]       Signed: Cecilie Kicks. 02/07/2013, 10:13 AM

## 2013-02-07 NOTE — Op Note (Signed)
[name]:  Adam Petty, Adam Petty            ACCOUNT NO.:  [id number]  MEDICAL RECORD NO.:  [id number]  LOCATION:  1610                         [facility]  PHYSICIAN:  Jene EveryJeffrey Armin Yerger, M.D.    DATE OF BIRTH:  Jun 07, 1960  DATE OF PROCEDURE:  02/06/2013 DATE OF DISCHARGE:                              OPERATIVE REPORT   PREOPERATIVE DIAGNOSES:  Spinal stenosis, herniated nucleus pulposus at L4-5.  POSTOPERATIVE DIAGNOSES:  Spinal stenosis, associated foraminal stenosis at L4-5 and L3-4.  PROCEDURES PERFORMED: 1. Central Gill laminectomy of L4 with bilateral foraminotomies of L4     and L5. 2. Decompression of L3-4.  ANESTHESIA:  General.  ASSISTANT:  Marlowe KaysJames Aplington, M.D.  HISTORY:  A 53 year old with right lower extremity radicular pain, L4-L5 nerve root distribution.  He has had end-stage disk degeneration, minimal back pain, foraminal stenosis, and old positive straight leg raise, myotomal weakness, dermatomal dysesthesias, indicated for decompression without fusion, was not moving the flexion, extension without back pain, and failed decompression with foraminotomy, would address his pathology.  We discussed the risks and benefits including bleeding, infection, damage to neurovascular structures, DVT, PE, anesthetic complications, etc.  TECHNIQUE:  With the patient in supine position, after induction of adequate general anesthesia and 2 g Kefzol, placed prone on the AtkinsAndrews frame.  All bony prominences were well padded.  Lumbar region was prepped and draped in usual sterile fashion.  Two 18-gauge spinal needles were utilized to localize the L4-5 interspace, confirmed with x- ray.  Incision was made from the spinous process of L3-4 to just below L5.  Subcutaneous tissue was dissected.  Electrocautery was utilized to achieve hemostasis.  Dorsolumbar fascia was identified and divided, in line with skin incision.  Paraspinous muscle was elevated from lamina of L3-4 and L4-5.   McCullough retractor was placed.  Operating microscope was draped and brought in the surgical field after confirmatory radiograph was obtained with Kochers on the spinous processes of L3 and at L4-5.  Leksell rongeur was utilized to remove the spinous process of L4, portion of the spinous processes of L5 and L3.  We proceeded first essentially to use a 2-mm Kerrison, performed a full laminectomy of L4, detaching the ligamentum flavum with a straight curette.  We then used a straight curette to detach the ligamentum flavum from the cephalad edge of L5.  With neuro patties placed beneath the ligamentum flavum, we performed bilateral hemilaminotomies of L5 and foraminotomies of L5 bilaterally, it was fairly stenotic on the right.  Dura was protected at all times.  Both sides of the operating room table, we decompressed the lateral recesses from the medial border of the pedicle.  The lamina of L4 was removed and we performed generous foraminotomy bilaterally at L4 and L5, particularly at L4 on the right.  Fair amount of osteophyte, ligamentum flavum infolding due to disk degeneration.  We decompressed this.  We used bipolar electrocautery to achieve hemostasis.  He had a hardened disk L4-5 bilaterally.  Mainly osteophyte.  It was not impinging.  At L3-4, we removed the ligamentum flavum from L3-4.  Decompressed the lateral recesses.  We followed the L4 root from L3-4, passed the pedicle of L4 and up the  foramen of L4. No compression was noted.  There was good restoration of thecal sac. There was predominantly central and lateral recess stenosis bilaterally. Copiously irrigated the wound.  Inspected, no evidence of active bleeding or CSF leakage.  We placed thrombin-soaked Gelfoam over the laminotomy defects.  Removed the [facility] retractor.  Paraspinous muscles were irrigated, no evidence of active bleeding.  Dorsolumbar fascia was reapproximated with #1 Vicryl interrupted and subcu with  2-0, skin staples.  Wound was dressed sterilely.  Placed supine on the hospital bed, extubated without difficulty, and transported to the recovery room in satisfactory condition.  The patient tolerated the procedure well.  There were no complications. Blood loss 25 mL.     Jene Every, M.D.     Cordelia Pen  D:  02/06/2013  T:  02/07/2013  Job:  [id number]

## 2013-02-07 NOTE — Progress Notes (Signed)
Subjective: 1 Day Post-Op Procedure(s) (LRB): MICRO LUMBAR DECOMPRESSION L3-L4 and L4 - L5  (N/A) Patient reports pain as mild.  Seen in AM rounds by myself and Dr. Shelle IronBeane. Foley removed last night. Voiding without issues. Has been OOB with PT, tolerating well. Leg pain improved, no tingling. Notes incisional soreness. Feels ready for D/C home today.  Objective: Vital signs in last 24 hours: Temp:  [97.5 F (36.4 C)-98.8 F (37.1 C)] 98.8 F (37.1 C) (01/22 0555) Pulse Rate:  [66-90] 90 (01/22 0555) Resp:  [12-18] 16 (01/22 0555) BP: (112-168)/(56-88) 118/80 mmHg (01/22 0555) SpO2:  [94 %-100 %] 95 % (01/22 0555) FiO2 (%):  [100 %] 100 % (01/21 1516) Weight:  [110.224 kg (243 lb)] 110.224 kg (243 lb) (01/21 1516)  Intake/Output from previous day: 01/21 0701 - 01/22 0700 In: 2888.3 [P.O.:620; I.V.:2163.3; IV Piggyback:105] Out: 1270 [Urine:1270] Intake/Output this shift: Total I/O In: -  Out: 300 [Urine:300]   Recent Labs  02/07/13 0518  HGB 13.8    Recent Labs  02/07/13 0518  WBC 10.1  RBC 4.85  HCT 41.9  PLT 186    Recent Labs  02/07/13 0518  NA 135*  K 4.7  CL 99  CO2 26  BUN 15  CREATININE 0.91  GLUCOSE 174*  CALCIUM 8.4   No results found for this basename: LABPT, INR,  in the last 72 hours  Neurologically intact ABD soft Neurovascular intact Sensation intact distally Intact pulses distally Dorsiflexion/Plantar flexion intact Incision: dressing C/D/I and no drainage No cellulitis present Compartment soft no calf pain or sign of DVT  Assessment/Plan: 1 Day Post-Op Procedure(s) (LRB): MICRO LUMBAR DECOMPRESSION L3-L4 and L4 - L5  (N/A) Advance diet Up with therapy D/C IV fluids Discussed D/C instructions, Lspine precautions D/C home today Follow up 10-14 days   BISSELL, JACLYN M. 02/07/2013, 10:10 AM

## 2013-02-08 ENCOUNTER — Encounter (HOSPITAL_COMMUNITY): Payer: Self-pay | Admitting: Specialist

## 2013-05-16 ENCOUNTER — Encounter (HOSPITAL_COMMUNITY): Payer: Self-pay | Admitting: Specialist

## 2013-05-16 NOTE — OR Nursing (Signed)
No duraform used

## 2013-06-19 ENCOUNTER — Ambulatory Visit: Payer: Self-pay | Admitting: Pain Medicine

## 2013-07-13 ENCOUNTER — Ambulatory Visit: Payer: Self-pay | Admitting: Pain Medicine

## 2013-07-15 ENCOUNTER — Ambulatory Visit: Payer: Self-pay | Admitting: Pain Medicine

## 2013-08-01 ENCOUNTER — Other Ambulatory Visit: Payer: Self-pay | Admitting: Orthopedic Surgery

## 2013-08-02 ENCOUNTER — Emergency Department: Payer: Self-pay | Admitting: Emergency Medicine

## 2013-08-02 LAB — COMPREHENSIVE METABOLIC PANEL
ALT: 74 U/L (ref 12–78)
Albumin: 3.7 g/dL (ref 3.4–5.0)
Alkaline Phosphatase: 107 U/L
Anion Gap: 8 (ref 7–16)
BUN: 17 mg/dL (ref 7–18)
Bilirubin,Total: 0.7 mg/dL (ref 0.2–1.0)
CHLORIDE: 103 mmol/L (ref 98–107)
CO2: 23 mmol/L (ref 21–32)
Calcium, Total: 8.6 mg/dL (ref 8.5–10.1)
Creatinine: 1.27 mg/dL (ref 0.60–1.30)
EGFR (African American): >60
EGFR (Non-African Amer.): >60
GLUCOSE: 255 mg/dL — AB (ref 65–99)
Osmolality: 278 (ref 275–301)
POTASSIUM: 4.4 mmol/L (ref 3.5–5.1)
SGOT(AST): 51 U/L — ABNORMAL HIGH (ref 15–37)
Sodium: 134 mmol/L — ABNORMAL LOW (ref 136–145)
Total Protein: 7.3 g/dL (ref 6.4–8.2)

## 2013-08-02 LAB — URINALYSIS, COMPLETE
BACTERIA: NONE SEEN
Bilirubin,UR: NEGATIVE
KETONE: NEGATIVE
LEUKOCYTE ESTERASE: NEGATIVE
Nitrite: NEGATIVE
PH: 5 (ref 4.5–8.0)
Protein: NEGATIVE
RBC,UR: 1 /HPF (ref 0–5)
Specific Gravity: 1.031 (ref 1.003–1.030)
Squamous Epithelial: <1
WBC UR: 1 /HPF (ref 0–5)

## 2013-08-02 LAB — CBC
HCT: 42.5 % (ref 40.0–52.0)
HGB: 14.3 g/dL (ref 13.0–18.0)
MCH: 29.3 pg (ref 26.0–34.0)
MCHC: 33.6 g/dL (ref 32.0–36.0)
MCV: 87 fL (ref 80–100)
Platelet: 159 10*3/uL (ref 150–440)
RBC: 4.87 10*6/uL (ref 4.40–5.90)
RDW: 13.6 % (ref 11.5–14.5)
WBC: 8.2 10*3/uL (ref 3.8–10.6)

## 2013-08-13 ENCOUNTER — Encounter (HOSPITAL_COMMUNITY): Payer: Self-pay | Admitting: Pharmacy Technician

## 2013-08-14 ENCOUNTER — Ambulatory Visit: Payer: Self-pay | Admitting: Pain Medicine

## 2013-08-16 ENCOUNTER — Inpatient Hospital Stay (HOSPITAL_COMMUNITY): Admission: RE | Admit: 2013-08-16 | Payer: Self-pay | Source: Ambulatory Visit

## 2013-09-10 DIAGNOSIS — E1169 Type 2 diabetes mellitus with other specified complication: Secondary | ICD-10-CM | POA: Insufficient documentation

## 2013-09-10 DIAGNOSIS — E785 Hyperlipidemia, unspecified: Secondary | ICD-10-CM

## 2013-09-10 DIAGNOSIS — E669 Obesity, unspecified: Secondary | ICD-10-CM | POA: Insufficient documentation

## 2013-09-11 ENCOUNTER — Ambulatory Visit: Payer: Self-pay | Admitting: Pain Medicine

## 2013-09-12 DIAGNOSIS — E1165 Type 2 diabetes mellitus with hyperglycemia: Secondary | ICD-10-CM | POA: Insufficient documentation

## 2013-09-12 DIAGNOSIS — R809 Proteinuria, unspecified: Secondary | ICD-10-CM | POA: Insufficient documentation

## 2013-09-12 DIAGNOSIS — Z794 Long term (current) use of insulin: Secondary | ICD-10-CM

## 2013-09-24 NOTE — Patient Instructions (Addendum)
Adam Negus Hockey Jr.  09/24/2013                           YOUR PROCEDURE IS SCHEDULED ON: 10/02/13               ENTER THRU [facility] MAIN [facility]OSPITAL ENTRANCE AND                            FOLLOW  SIGNS TO SHORT STAY CENTER                 ARRIVE AT SHORT STAY AT: 8:30 am               CALL THIS NUMBER IF ANY PROBLEMS THE DAY OF SURGERY :               [phone]                                REMEMBER:   Do not eat food or drink liquids AFTER MIDNIGHT                  Take these medicines the morning of surgery with               A SIPS OF WATER :    GABAPENTIN / OXYCODONE      Do not wear jewelry, make-up   Do not wear lotions, powders, or perfumes.   Do not shave legs or underarms 12 hrs. before surgery (men may shave face)  Do not bring valuables to the hospital.  Contacts, dentures or bridgework may not be worn into surgery.  Leave suitcase in the car. After surgery it may be brought to your room.  For patients admitted to the hospital more than one night, checkout time is            11:00 AM                                                       The day of discharge.   Patients discharged the day of surgery will not be allowed to drive home.            If going home same day of surgery, must have someone stay with you              FIRST 24 hrs at home and arrange for some one to drive you              home from hospital.   ________________________________________________________________________                                                                        [facility] - PREPARING FOR SURGERY  Before surgery, you can play an important role.  Because skin is not sterile, your skin needs to be as free of germs as possible.  You can reduce the number of germs on  your skin by washing with CHG (chlorahexidine gluconate) soap before surgery.  CHG is an antiseptic cleaner which kills germs and bonds with the skin to continue killing germs even after  washing. Please DO NOT use if you have an allergy to CHG or antibacterial soaps.  If your skin becomes reddened/irritated stop using the CHG and inform your nurse when you arrive at Short Stay. Do not shave (including legs and underarms) for at least 48 hours prior to the first CHG shower.  You may shave your face. Please follow these instructions carefully:   1.  Shower with CHG Soap the night before surgery and the  morning of Surgery.   2.  If you choose to wash your hair, wash your hair first as usual with your  normal  Shampoo.   3.  After you shampoo, rinse your hair and body thoroughly to remove the  shampoo.                                         4.  Use CHG as you would any other liquid soap.  You can apply chg directly  to the skin and wash . Gently wash with scrungie or clean wascloth    5.  Apply the CHG Soap to your body ONLY FROM THE NECK DOWN.   Do not use on open                           Wound or open sores. Avoid contact with eyes, ears mouth and genitals (private parts).                        Genitals (private parts) with your normal soap.              6.  Wash thoroughly, paying special attention to the area where your surgery  will be performed.   7.  Thoroughly rinse your body with warm water from the neck down.   8.  DO NOT shower/wash with your normal soap after using and rinsing off  the CHG Soap .                9.  Pat yourself dry with a clean towel.             10.  Wear clean pajamas.             11.  Place clean sheets on your bed the night of your first shower and do not  sleep with pets.  Day of Surgery : Do not apply any lotions/deodorants the morning of surgery.  Please wear clean clothes to the hospital/surgery center.  FAILURE TO FOLLOW THESE INSTRUCTIONS MAY RESULT IN THE CANCELLATION OF YOUR SURGERY    PATIENT  SIGNATURE_________________________________  ______________________________________________________________________     Adam Petty  An incentive spirometer is a tool that can help keep your lungs clear and active. This tool measures how well you are filling your lungs with each breath. Taking long deep breaths may help reverse or decrease the chance of developing breathing (pulmonary) problems (especially infection) following:  A long period of time when you are unable to move or be active. BEFORE THE PROCEDURE   If the spirometer includes an indicator to show your best effort, your nurse or respiratory therapist will set it to a  desired goal.  If possible, sit up straight or lean slightly forward. Try not to slouch.  Hold the incentive spirometer in an upright position. INSTRUCTIONS FOR USE  1. Sit on the edge of your bed if possible, or sit up as far as you can in bed or on a chair. 2. Hold the incentive spirometer in an upright position. 3. Breathe out normally. 4. Place the mouthpiece in your mouth and seal your lips tightly around it. 5. Breathe in slowly and as deeply as possible, raising the piston or the ball toward the top of the column. 6. Hold your breath for 3-5 seconds or for as long as possible. Allow the piston or ball to fall to the bottom of the column. 7. Remove the mouthpiece from your mouth and breathe out normally. 8. Rest for a few seconds and repeat Steps 1 through 7 at least 10 times every 1-2 hours when you are awake. Take your time and take a few normal breaths between deep breaths. 9. The spirometer may include an indicator to show your best effort. Use the indicator as a goal to work toward during each repetition. 10. After each set of 10 deep breaths, practice coughing to be sure your lungs are clear. If you have an incision (the cut made at the time of surgery), support your incision when coughing by placing a pillow or rolled up towels firmly  against it. Once you are able to get out of bed, walk around indoors and cough well. You may stop using the incentive spirometer when instructed by your caregiver.  RISKS AND COMPLICATIONS  Take your time so you do not get dizzy or light-headed.  If you are in pain, you may need to take or ask for pain medication before doing incentive spirometry. It is harder to take a deep breath if you are having pain. AFTER USE  Rest and breathe slowly and easily.  It can be helpful to keep track of a log of your progress. Your caregiver can provide you with a simple table to help with this. If you are using the spirometer at home, follow these instructions: [place] IF:   You are having difficultly using the spirometer.  You have trouble using the spirometer as often as instructed.  Your pain medication is not giving enough relief while using the spirometer.  You develop fever of 100.5 F (38.1 C) or higher. SEEK IMMEDIATE MEDICAL CARE IF:   You cough up bloody sputum that had not been present before.  You develop fever of 102 F (38.9 C) or greater.  You develop worsening pain at or near the incision site. MAKE SURE YOU:   Understand these instructions.  Will watch your condition.  Will get help right away if you are not doing well or get worse. Document Released: 05/16/2006 Document Revised: 03/28/2011 Document Reviewed: 07/17/2006 [facility] Patient Information 2014 [place], [place].   ________________________________________________________________________

## 2013-09-26 ENCOUNTER — Encounter (HOSPITAL_COMMUNITY): Payer: Self-pay

## 2013-09-26 ENCOUNTER — Encounter (HOSPITAL_COMMUNITY)
Admission: RE | Admit: 2013-09-26 | Discharge: 2013-09-26 | Disposition: A | Discharge status: Home / Self Care | Payer: Worker's Compensation | Source: Ambulatory Visit | Attending: Orthopedic Surgery | Admitting: Orthopedic Surgery

## 2013-09-26 DIAGNOSIS — Z01810 Encounter for preprocedural cardiovascular examination: Secondary | ICD-10-CM | POA: Insufficient documentation

## 2013-09-26 DIAGNOSIS — Z01812 Encounter for preprocedural laboratory examination: Secondary | ICD-10-CM | POA: Insufficient documentation

## 2013-09-26 DIAGNOSIS — M23302 Other meniscus derangements, unspecified lateral meniscus, unspecified knee: Secondary | ICD-10-CM | POA: Insufficient documentation

## 2013-09-26 HISTORY — DX: Other tear of lateral meniscus, current injury, unspecified knee, initial encounter: S83.289A

## 2013-09-26 LAB — CBC
HCT: 43.8 % (ref 39.0–52.0)
HEMOGLOBIN: 14.8 g/dL (ref 13.0–17.0)
MCH: 29.1 pg (ref 26.0–34.0)
MCHC: 33.8 g/dL (ref 30.0–36.0)
MCV: 86.2 fL (ref 78.0–100.0)
Platelets: 221 10*3/uL (ref 150–400)
RBC: 5.08 MIL/uL (ref 4.22–5.81)
RDW: 13.3 % (ref 11.5–15.5)
WBC: 7.4 10*3/uL (ref 4.0–10.5)

## 2013-09-26 LAB — BASIC METABOLIC PANEL
Anion gap: 12 (ref 5–15)
BUN: 14 mg/dL (ref 6–23)
CALCIUM: 9.9 mg/dL (ref 8.4–10.5)
CO2: 24 mEq/L (ref 19–32)
Chloride: 98 mEq/L (ref 96–112)
Creatinine, Ser: 0.89 mg/dL (ref 0.50–1.35)
GFR calc Af Amer: >90 mL/min (ref >90)
GLUCOSE: 142 mg/dL — AB (ref 70–99)
POTASSIUM: 5.2 meq/L (ref 3.7–5.3)
Sodium: 134 mEq/L — ABNORMAL LOW (ref 137–147)

## 2013-09-26 NOTE — Progress Notes (Signed)
09/26/13 0855  OBSTRUCTIVE SLEEP APNEA  Have you ever been diagnosed with sleep apnea through a sleep study? No  Do you snore loudly (loud enough to be heard through closed doors)?  0  Do you often feel tired, fatigued, or sleepy during the daytime? 0  Has anyone observed you stop breathing during your sleep? 0  Do you have, or are you being treated for high blood pressure? 0  BMI more than 35 kg/m2? 1  Age over 51 years old? 1  Neck circumference greater than 40 cm/16 inches? 1  Gender: 1  Obstructive Sleep Apnea Score 4  Score 4 or greater  Results sent to PCP

## 2013-10-01 DIAGNOSIS — S83289A Other tear of lateral meniscus, current injury, unspecified knee, initial encounter: Secondary | ICD-10-CM | POA: Diagnosis present

## 2013-10-01 NOTE — H&P (Signed)
CC- Adam Petty. is a 53 y.o. male who presents with right knee pain.  HPI- . Knee Pain: Patient presents with knee pain involving the  right knee. Onset of the symptoms was several years ago. Inciting event: work related injury. Current symptoms include giving out, pain located laterally and stiffness. Pain is aggravated by lateral movements, pivoting, rising after sitting, standing and walking.  Patient has had no prior knee problems. Evaluation to date: MRI: abnormal lateral meniscal tear. Treatment to date: corticosteroid injection which was not very effective.  Past Medical History  Diagnosis Date  . Back pain   . IBS (irritable bowel syndrome)   . History of kidney stones   . Diabetes mellitus without complication   . Preventative health care     takes crestor/ altace for "preventative reasons" denies heart or BP problems  . Lateral meniscus tear     rt knee    Past Surgical History  Procedure Laterality Date  . Tonsillectomy    . Shoulder surgery      x5 on RT / x1 LFT  . Palate surgery      surg for sleep apnea and sinus issues  . Cervical fusion    . Foot surgery      RT  . Lumbar laminectomy/decompression microdiscectomy N/A 02/06/2013    Procedure: MICRO LUMBAR DECOMPRESSION L3-L4 and L4 - L5 ;  Surgeon: Javier Docker, MD;  Location: [facility];  Service: Orthopedics;  Laterality: N/A;    Prior to Admission medications   Medication Sig Start Date End Date Taking? Authorizing Provider  Canagliflozin (INVOKANA) 300 MG TABS Take 300 mg by mouth daily.    Historical Provider, MD  docusate sodium (COLACE) 100 MG capsule Take 1 capsule (100 mg total) by mouth 2 (two) times daily. 02/06/13   Dayna Barker. Bissell, PA-C  gabapentin (NEURONTIN) 600 MG tablet Take 600 mg by mouth 4 (four) times daily.    Historical Provider, MD  LIRAGLUTIDE [place] Inject 1.8 mg into the skin daily. As directed.    Historical Provider, MD  Lubiprostone (AMITIZA PO) Take 20 mg by mouth every  morning.    Historical Provider, MD  metFORMIN (GLUCOPHAGE) 1000 MG tablet Take 1,000 mg by mouth 2 (two) times daily with a meal.    Historical Provider, MD  OVER THE COUNTER MEDICATION Take 1 capsule by mouth 2 (two) times daily. Lipozene    Historical Provider, MD  oxyCODONE (OXY IR/ROXICODONE) 5 MG immediate release tablet Take 1-2 tablets (5-10 mg total) by mouth every 4 (four) hours as needed for severe pain. 02/06/13   Dayna Barker. Bissell, PA-C  phentermine 30 MG capsule Take 30 mg by mouth every morning.    Historical Provider, MD  ramipril (ALTACE) 5 MG capsule Take 5 mg by mouth every morning.    Historical Provider, MD  rosuvastatin (CRESTOR) 5 MG tablet Take 5 mg by mouth 3 (three) times a week.    Historical Provider, MD   KNEE EXAM antalgic gait, soft tissue tenderness over lateral joint line, no effusion, negative pivot-shift, collateral ligaments intact  Physical Examination: General appearance - alert, well appearing, and in no distress Mental status - alert, oriented to person, place, and time Chest - clear to auscultation, no wheezes, rales or rhonchi, symmetric air entry Heart - normal rate, regular rhythm, normal S1, S2, no murmurs, rubs, clicks or gallops Abdomen - soft, nontender, nondistended, no masses or organomegaly Neurological - alert, oriented, normal speech, no focal findings  or movement disorder noted   Asessment/Plan--- Right knee lateral meniscal tear- - Plan right knee arthroscopy with meniscal debridement. Procedure risks and potential comps discussed with patient who elects to proceed. Goals are decreased pain and increased function with a high likelihood of achieving both

## 2013-10-02 ENCOUNTER — Ambulatory Visit (HOSPITAL_COMMUNITY)
Admission: RE | Admit: 2013-10-02 | Discharge: 2013-10-02 | Disposition: A | Discharge status: Home / Self Care | Payer: Worker's Compensation | Source: Ambulatory Visit | Attending: Orthopedic Surgery | Admitting: Orthopedic Surgery

## 2013-10-02 ENCOUNTER — Encounter (HOSPITAL_COMMUNITY)
Admission: RE | Disposition: A | Discharge status: Home / Self Care | Payer: Self-pay | Source: Ambulatory Visit | Attending: Orthopedic Surgery

## 2013-10-02 ENCOUNTER — Ambulatory Visit (HOSPITAL_COMMUNITY): Payer: Worker's Compensation | Admitting: Anesthesiology

## 2013-10-02 ENCOUNTER — Encounter (HOSPITAL_COMMUNITY): Payer: Worker's Compensation | Admitting: Anesthesiology

## 2013-10-02 ENCOUNTER — Encounter (HOSPITAL_COMMUNITY): Payer: Self-pay | Admitting: *Deleted

## 2013-10-02 DIAGNOSIS — Y929 Unspecified place or not applicable: Secondary | ICD-10-CM | POA: Insufficient documentation

## 2013-10-02 DIAGNOSIS — E119 Type 2 diabetes mellitus without complications: Secondary | ICD-10-CM | POA: Insufficient documentation

## 2013-10-02 DIAGNOSIS — Z79899 Other long term (current) drug therapy: Secondary | ICD-10-CM | POA: Insufficient documentation

## 2013-10-02 DIAGNOSIS — X58XXXA Exposure to other specified factors, initial encounter: Secondary | ICD-10-CM | POA: Insufficient documentation

## 2013-10-02 DIAGNOSIS — K589 Irritable bowel syndrome without diarrhea: Secondary | ICD-10-CM | POA: Insufficient documentation

## 2013-10-02 DIAGNOSIS — S83289A Other tear of lateral meniscus, current injury, unspecified knee, initial encounter: Secondary | ICD-10-CM | POA: Insufficient documentation

## 2013-10-02 DIAGNOSIS — S83281D Other tear of lateral meniscus, current injury, right knee, subsequent encounter: Secondary | ICD-10-CM

## 2013-10-02 HISTORY — PX: KNEE ARTHROSCOPY: SHX127

## 2013-10-02 LAB — GLUCOSE, CAPILLARY
Glucose-Capillary: 140 mg/dL — ABNORMAL HIGH (ref 70–99)
Glucose-Capillary: 141 mg/dL — ABNORMAL HIGH (ref 70–99)

## 2013-10-02 SURGERY — ARTHROSCOPY, KNEE
Anesthesia: General | Site: Knee | Laterality: Right

## 2013-10-02 MED ORDER — CEFAZOLIN SODIUM-DEXTROSE 2-3 GM-% IV SOLR
INTRAVENOUS | Status: AC
  Filled 2013-10-02: qty 50

## 2013-10-02 MED ORDER — MIDAZOLAM HCL 5 MG/5ML IJ SOLN
INTRAMUSCULAR | Status: DC | PRN
  Administered 2013-10-02: 2 mg via INTRAVENOUS

## 2013-10-02 MED ORDER — CHLORHEXIDINE GLUCONATE 4 % EX LIQD: 60.0000 mL | Freq: Once | CUTANEOUS | Status: DC

## 2013-10-02 MED ORDER — PROPOFOL 10 MG/ML IV BOLUS
INTRAVENOUS | Status: DC | PRN
  Administered 2013-10-02: 200 mg via INTRAVENOUS

## 2013-10-02 MED ORDER — CEFAZOLIN SODIUM-DEXTROSE 2-3 GM-% IV SOLR
2.0000 g | INTRAVENOUS | Status: AC
  Administered 2013-10-02: 2 g via INTRAVENOUS

## 2013-10-02 MED ORDER — FENTANYL CITRATE 0.05 MG/ML IJ SOLN
25.0000 ug | INTRAMUSCULAR | Status: DC | PRN
  Administered 2013-10-02 (×3): 50 ug via INTRAVENOUS

## 2013-10-02 MED ORDER — LACTATED RINGERS IV SOLN
INTRAVENOUS | Status: DC
  Administered 2013-10-02: 1000 mL via INTRAVENOUS
  Administered 2013-10-02: 12:00:00 via INTRAVENOUS

## 2013-10-02 MED ORDER — HYDROMORPHONE HCL PF 1 MG/ML IJ SOLN
INTRAMUSCULAR | Status: DC | PRN
  Administered 2013-10-02: .4 mg via INTRAVENOUS
  Administered 2013-10-02: .8 mg via INTRAVENOUS
  Administered 2013-10-02 (×2): .4 mg via INTRAVENOUS

## 2013-10-02 MED ORDER — PROMETHAZINE HCL 25 MG/ML IJ SOLN
6.2500 mg | INTRAMUSCULAR | Status: DC | PRN
Start: 2013-10-02 — End: 2013-10-02

## 2013-10-02 MED ORDER — SODIUM CHLORIDE 0.9 % IJ SOLN
INTRAMUSCULAR | Status: AC
  Filled 2013-10-02: qty 10

## 2013-10-02 MED ORDER — FENTANYL CITRATE 0.05 MG/ML IJ SOLN
INTRAMUSCULAR | Status: AC
  Filled 2013-10-02: qty 2

## 2013-10-02 MED ORDER — FENTANYL CITRATE 0.05 MG/ML IJ SOLN
INTRAMUSCULAR | Status: DC | PRN
  Administered 2013-10-02 (×2): 50 ug via INTRAVENOUS

## 2013-10-02 MED ORDER — SODIUM CHLORIDE 0.9 % IV SOLN: INTRAVENOUS | Status: DC

## 2013-10-02 MED ORDER — PROPOFOL 10 MG/ML IV BOLUS
INTRAVENOUS | Status: AC
  Filled 2013-10-02: qty 20

## 2013-10-02 MED ORDER — EPHEDRINE SULFATE 50 MG/ML IJ SOLN
INTRAMUSCULAR | Status: AC
  Filled 2013-10-02: qty 1

## 2013-10-02 MED ORDER — METHOCARBAMOL 500 MG PO TABS: 500.0000 mg | ORAL_TABLET | Freq: Four times a day (QID) | ORAL | Status: DC

## 2013-10-02 MED ORDER — BUPIVACAINE-EPINEPHRINE (PF) 0.25% -1:200000 IJ SOLN
INTRAMUSCULAR | Status: AC
Start: 2013-10-02 — End: 2013-10-02
  Filled 2013-10-02: qty 30

## 2013-10-02 MED ORDER — LACTATED RINGERS IR SOLN
Status: DC | PRN
  Administered 2013-10-02: 9000 mL

## 2013-10-02 MED ORDER — ONDANSETRON HCL 4 MG/2ML IJ SOLN
INTRAMUSCULAR | Status: DC | PRN
  Administered 2013-10-02: 4 mg via INTRAVENOUS

## 2013-10-02 MED ORDER — DEXAMETHASONE SODIUM PHOSPHATE 10 MG/ML IJ SOLN: 10.0000 mg | Freq: Once | INTRAMUSCULAR | Status: DC

## 2013-10-02 MED ORDER — OXYCODONE HCL 5 MG PO TABS
5.0000 mg | ORAL_TABLET | ORAL | Status: DC | PRN
  Administered 2013-10-02: 5 mg via ORAL
  Filled 2013-10-02: qty 1

## 2013-10-02 MED ORDER — ONDANSETRON HCL 4 MG/2ML IJ SOLN
INTRAMUSCULAR | Status: AC
  Filled 2013-10-02: qty 2

## 2013-10-02 MED ORDER — FENTANYL CITRATE 0.05 MG/ML IJ SOLN
25.0000 ug | INTRAMUSCULAR | Status: AC | PRN
  Administered 2013-10-02 (×6): 25 ug via INTRAVENOUS

## 2013-10-02 MED ORDER — ACETAMINOPHEN 10 MG/ML IV SOLN
1000.0000 mg | Freq: Once | INTRAVENOUS | Status: AC
  Administered 2013-10-02: 1000 mg via INTRAVENOUS
  Filled 2013-10-02: qty 100

## 2013-10-02 MED ORDER — BUPIVACAINE-EPINEPHRINE 0.25% -1:200000 IJ SOLN
INTRAMUSCULAR | Status: DC | PRN
  Administered 2013-10-02: 20 mL

## 2013-10-02 MED ORDER — OXYCODONE HCL 5 MG PO TABS: 5.0000 mg | ORAL_TABLET | ORAL | Status: DC | PRN

## 2013-10-02 MED ORDER — MIDAZOLAM HCL 2 MG/2ML IJ SOLN
INTRAMUSCULAR | Status: AC
  Filled 2013-10-02: qty 2

## 2013-10-02 MED ORDER — HYDROMORPHONE HCL PF 2 MG/ML IJ SOLN
INTRAMUSCULAR | Status: AC
  Filled 2013-10-02: qty 1

## 2013-10-02 MED ORDER — LIDOCAINE HCL (CARDIAC) 20 MG/ML IV SOLN
INTRAVENOUS | Status: DC | PRN
  Administered 2013-10-02: 100 mg via INTRAVENOUS

## 2013-10-02 SURGICAL SUPPLY — 27 items
BLADE 4.2CUDA (BLADE) ×3 IMPLANT
CLOTH BEACON ORANGE TIMEOUT ST (SAFETY) ×3 IMPLANT
COUNTER NEEDLE 20 DBL MAG RED (NEEDLE) ×3 IMPLANT
CUFF TOURN SGL QUICK 34 (TOURNIQUET CUFF) ×3
CUFF TRNQT CYL 34X4X40X1 (TOURNIQUET CUFF) ×1 IMPLANT
DRAPE U-SHAPE 47X51 STRL (DRAPES) ×3 IMPLANT
DRSG EMULSION OIL 3X3 NADH (GAUZE/BANDAGES/DRESSINGS) ×3 IMPLANT
DURAPREP 26ML APPLICATOR (WOUND CARE) ×3 IMPLANT
GLOVE BIO SURGEON STRL SZ7.5 (GLOVE) ×2 IMPLANT
GLOVE BIO SURGEON STRL SZ8 (GLOVE) ×3 IMPLANT
GLOVE BIOGEL PI IND STRL 7.5 (GLOVE) IMPLANT
GLOVE BIOGEL PI IND STRL 8 (GLOVE) ×1 IMPLANT
GLOVE BIOGEL PI INDICATOR 7.5 (GLOVE) ×2
GLOVE BIOGEL PI INDICATOR 8 (GLOVE) ×2
GOWN STRL REUS W/TWL LRG LVL3 (GOWN DISPOSABLE) ×3 IMPLANT
GOWN STRL REUS W/TWL XL LVL3 (GOWN DISPOSABLE) ×2 IMPLANT
MANIFOLD NEPTUNE II (INSTRUMENTS) ×3 IMPLANT
PACK ARTHROSCOPY WL (CUSTOM PROCEDURE TRAY) ×3 IMPLANT
PACK ICE MAXI GEL EZY WRAP (MISCELLANEOUS) ×9 IMPLANT
PADDING CAST COTTON 6X4 STRL (CAST SUPPLIES) ×6 IMPLANT
POSITIONER SURGICAL ARM (MISCELLANEOUS) ×3 IMPLANT
SET ARTHROSCOPY TUBING (MISCELLANEOUS) ×3
SET ARTHROSCOPY TUBING LN (MISCELLANEOUS) ×1 IMPLANT
SUT ETHILON 4 0 PS 2 18 (SUTURE) ×3 IMPLANT
TOWEL OR 17X26 10 PK STRL BLUE (TOWEL DISPOSABLE) ×3 IMPLANT
WAND 90 DEG TURBOVAC W/CORD (SURGICAL WAND) ×2 IMPLANT
WRAP KNEE MAXI GEL POST OP (GAUZE/BANDAGES/DRESSINGS) ×3 IMPLANT

## 2013-10-02 NOTE — Anesthesia Postprocedure Evaluation (Signed)
  Anesthesia Post-op Note  Patient: Adam Petty.  Procedure(s) Performed: Procedure(s) (LRB): RIGHT KNEE ARTHROSCOPY, PARTIAL LATERAL MENISCECTOMY, DEBRIDEMENT, MEDIAL AND LATERAL CHONDROPLASTY (Right)  Patient Location: PACU  Anesthesia Type: General  Level of Consciousness: awake and alert   Airway and Oxygen Therapy: Patient Spontanous Breathing  Post-op Pain: mild  Post-op Assessment: Post-op Vital signs reviewed, Patient's Cardiovascular Status Stable, Respiratory Function Stable, Patent Airway and No signs of Nausea or vomiting  Last Vitals:  Filed Vitals:   10/02/13 1252  BP: 133/75  Pulse: 77  Temp: 36.6 C  Resp: 12    Post-op Vital Signs: stable   Complications: No apparent anesthesia complications

## 2013-10-02 NOTE — Transfer of Care (Signed)
Immediate Anesthesia Transfer of Care Note  Patient: Adam Petty.  Procedure(s) Performed: Procedure(s): RIGHT KNEE ARTHROSCOPY, PARTIAL LATERAL MENISCECTOMY, DEBRIDEMENT, MEDIAL AND LATERAL CHONDROPLASTY (Right)  Patient Location: PACU  Anesthesia Type:General  Level of Consciousness: awake, alert  and oriented  Airway & Oxygen Therapy: Patient Spontanous Breathing and Patient connected to face mask oxygen  Post-op Assessment: Report given to PACU RN and Post -op Vital signs reviewed and stable  Post vital signs: Reviewed and stable  Complications: No apparent anesthesia complications

## 2013-10-02 NOTE — Op Note (Signed)
Preoperative diagnosis-  Right knee lateral meniscal tear  Postoperative diagnosis Right- knee lateral meniscal tear plus  Medial and lateral chondral defects  Procedure- Right knee arthroscopy with lateral  meniscal debridement and medial and lateral chondroplasty   Surgeon- Gus Rankin. Toyna Erisman, MD  Anesthesia-General  EBL-  Minimal  Complications- None  Condition- PACU - hemodynamically stable.  Brief clinical note- -Adam Petty. is a 53 y.o.  male with a several year history of right knee pain and mechanical symptoms after a work injury. Exam and history suggested lateral meniscal tear confirmed by MRI. The patient presents now for arthroscopy and debridement   Procedure in detail -       After successful administration of General anesthetic, a tourmiquet is placed high on the Right  thigh and the Right lower extremity is prepped and draped in the usual sterile fashion. Time out is performed by the surgical team. Standard superomedial and inferolateral portal sites are marked and incisions made with an 11 blade. The inflow cannula is passed through the superomedial portal and camera through the inferolateral portal and inflow is initiated. Arthroscopic visualization proceeds.      The undersurface of the patella and trochlea are visualized and there are Grade III changes in the trochlea without any unstable cartilage lesions. The medial and lateral gutters are visualized and there are  no loose bodies. Flexion and valgus force is applied to the knee and the medial compartment is entered. A spinal needle is passed into the joint through the site marked for the inferomedial portal. A small incision is made and the dilator passed into the joint. The findings for the medial compartment are 1 x 2 cm area of unstable chondral lesion medial femoral condyle without any meniscal tears. The shaver is used to debride the unstable cartilage to a stable cartilaginous base with stable edges. It is  probed and found to be stable.    The intercondylar notch is visualized and the ACL appears normal. The lateral compartment is entered and the findings are tear of body and posterior horn lateral meniscus with 1 x 2 cm unstable chondral lesion femoral condyle . The tear is debrided to a stable base with baskets and a shaver and sealed off with the Arthrocare. The shaver is used to debride the unstable cartilage to a stable cartilaginous base with stable edges. It is probed and found to be stable.     The joint is again inspected and there are no other tears, defects or loose bodies identified. The arthroscopic equipment is then removed from the inferior portals which are closed with interrupted 4-0 nylon. 20 ml of .25% Marcaine with epinephrine are injected through the inflow cannula and the cannula is then removed and the portal closed with nylon. The incisions are cleaned and dried and a bulky sterile dressing is applied. The patient is then awakened and transported to recovery in stable condition.   10/02/2013, 10:53 AM

## 2013-10-02 NOTE — Anesthesia Preprocedure Evaluation (Signed)
Anesthesia Evaluation  Patient identified by MRN, date of birth, ID band Patient awake    Reviewed: Allergy & Precautions, H&P , NPO status , Patient's Chart, lab work & pertinent test results  Airway Mallampati: II TM Distance: >3 FB Neck ROM: Full    Dental no notable dental hx.    Pulmonary neg pulmonary ROS,  breath sounds clear to auscultation  Pulmonary exam normal       Cardiovascular negative cardio ROS  Rhythm:Regular Rate:Normal     Neuro/Psych negative neurological ROS  negative psych ROS   GI/Hepatic negative GI ROS, Neg liver ROS,   Endo/Other  diabetes, Type 2, Oral Hypoglycemic AgentsMorbid obesity  Renal/GU negative Renal ROS  negative genitourinary   Musculoskeletal negative musculoskeletal ROS (+)   Abdominal (+) + obese,   Peds negative pediatric ROS (+)  Hematology negative hematology ROS (+)   Anesthesia Other Findings   Reproductive/Obstetrics negative OB ROS                           Anesthesia Physical Anesthesia Plan  ASA: III  Anesthesia Plan: General   Post-op Pain Management:    Induction: Intravenous  Airway Management Planned: LMA and Video Laryngoscope Planned  Additional Equipment:   Intra-op Plan:   Post-operative Plan: Extubation in OR  Informed Consent: I have reviewed the patients History and Physical, chart, labs and discussed the procedure including the risks, benefits and alternatives for the proposed anesthesia with the patient or authorized representative who has indicated his/her understanding and acceptance.   Dental advisory given  Plan Discussed with: CRNA  Anesthesia Plan Comments: (H/O difficult airway. Glidescope available.)        Anesthesia Quick Evaluation

## 2013-10-02 NOTE — Interval H&P Note (Signed)
History and Physical Interval Note:  10/02/2013 9:54 AM  Adam Petty.  has presented today for surgery, with the diagnosis of right knee lateral mensical tear  The various methods of treatment have been discussed with the patient and family. After consideration of risks, benefits and other options for treatment, the patient has consented to  Procedure(s): ARTHROSCOPY RIGHT KNEE WITH DEBRIDEMENT (Right) as a surgical intervention .  The patient's history has been reviewed, patient examined, no change in status, stable for surgery.  I have reviewed the patient's chart and labs.  Questions were answered to the patient's satisfaction.     Loanne Drilling

## 2013-10-02 NOTE — Discharge Instructions (Signed)
Arthroscopic Procedure, Knee °An arthroscopic procedure can find what is wrong with your knee. °PROCEDURE °Arthroscopy is a surgical technique that allows your orthopedic surgeon to diagnose and treat your knee injury with accuracy. They will look into your knee through a small instrument. This is almost like a small (pencil sized) telescope. Because arthroscopy affects your knee less than open knee surgery, you can anticipate a more rapid recovery. Taking an active role by following your caregiver's instructions will help with rapid and complete recovery. Use crutches, rest, elevation, ice, and knee exercises as instructed. The length of recovery depends on various factors including type of injury, age, physical condition, medical conditions, and your rehabilitation. °Your knee is the joint between the large bones (femur and tibia) in your leg. Cartilage covers these bone ends which are smooth and slippery and allow your knee to bend and move smoothly. Two menisci, thick, semi-lunar shaped pads of cartilage which form a rim inside the joint, help absorb shock and stabilize your knee. Ligaments bind the bones together and support your knee joint. Muscles move the joint, help support your knee, and take stress off the joint itself. Because of this all programs and physical therapy to rehabilitate an injured or repaired knee require rebuilding and strengthening your muscles. °AFTER THE PROCEDURE °· After the procedure, you will be moved to a recovery area until most of the effects of the medication have worn off. Your caregiver will discuss the test results with you.  °· Only take over-the-counter or prescription medicines for pain, discomfort, or fever as directed by your caregiver.  °SEEK MEDICAL CARE IF:  °· You have increased bleeding from your wounds.  °· You see redness, swelling, or have increasing pain in your wounds.  °· You have pus coming from your wound.  °· You have an oral temperature above 102° F (38.9°  C).  °· You notice a bad smell coming from the wound or dressing.  °· You have severe pain with any motion of your knee.  °SEEK IMMEDIATE MEDICAL CARE IF:  °· You develop a rash.  °· You have difficulty breathing.  °· You have any allergic problems.  °FURTHER INSTRUCTIONS: °· You may start showering two days after being discharged home but do not submerge the incisions under water.  °· Change dressing 48 hours after the procedure and then cover the small incisions with band aids until your follow up visit. °· Avoid periods of inactivity such as sitting longer than an hour when not asleep. This helps prevent blood clots.  °· You may put full weight on your legs and walk as much as is comfortable.  °· Do not drive while taking narcotics.  °Wear the elastic stockings for three weeks following surgery during the day but you may remove then at night. °· Make sure you keep all of your appointments after your operation with all of your doctors and caregivers. You should call the office at [phone] and make an appointment for approximately one week after the date of your surgery. °· Please pick up a stool softener and laxative for home use as long as you are requiring pain medications. °· Continue to use ice on the knee for pain and swelling from surgery. You may notice swelling that will progress down to the foot and ankle.  This is normal after surgery.  Elevate the leg when you are not up walking on it.   °RANGE OF MOTION AND STRENGTHENING EXERCISES  °Rehabilitation of the knee is   important following a knee injury or an operation. After just a few days of immobilization, the muscles of the thigh which control the knee become weakened and shrink (atrophy). Knee exercises are designed to build up the tone and strength of the thigh muscles and to improve knee motion. Often times heat used for twenty to thirty minutes before working out will loosen up your tissues and help with improving the range of motion but do not  use heat for the first two weeks following surgery. These exercises can be done on a training (exercise) mat, on the floor, on a table or on a bed. Use what ever works the best and is most comfortable for you Knee exercises include: ° ° ° ° ° ° °QUAD STRENGTHENING EXERCISES °Strengthening Quadriceps Sets ° °Tighten muscles on top of thigh by pushing knees down into floor or table. °Hold for 20 seconds. Repeat 10 times. °Do 2 sessions per day. ° ° ° °Strengthening Terminal Knee Extension ° °With knee bent over bolster, straighten knee by tightening muscle on top of thigh. Be sure to keep bottom of knee on bolster. °Hold for 20 seconds. Repeat 10 times. °Do 2 sessions per day. ° ° °Straight Leg with Bent Knee ° °Lie on back with opposite leg bent. Keep involved knee slightly bent at knee and raise leg 4-6". Hold for 10 seconds. °Repeat 20 times per set. °Do 2 sets per session. °Do 2 sessions per day. ° °

## 2013-10-02 NOTE — Progress Notes (Signed)
Ortho tech here to give patient crutches and instructions on cruth  Walking.

## 2013-10-03 ENCOUNTER — Encounter (HOSPITAL_COMMUNITY): Payer: Self-pay | Admitting: Orthopedic Surgery

## 2013-12-06 ENCOUNTER — Ambulatory Visit: Payer: Self-pay | Admitting: Pain Medicine

## 2014-05-09 NOTE — H&P (Signed)
PATIENT NAME:  Adam Petty, COMMONS MR#:  [id number] DATE OF BIRTH:  17-Mar-1960  PRIMARY CARE PHYSICIAN: Fonnie Jarvis. Ilene Qua, [place] PHYSICIAN: Lavonia Drafts, MD.  CHIEF COMPLAINT: Chest pressure.   HISTORY OF PRESENT ILLNESS: The patient is a pleasant, morbidly obese, 54 year old Caucasian male with a history of diabetes, hypertension, and chronic pain who sees pain specialist. The patient was in his usual state of health until this morning about 9:15 when he developed acute right-sided chest pressure without any radiation but did have nausea, cold sweats and was dizzy. He went to urgent care at [facility] who did some work-up including an EKG, he states. As the pressure persisted, although lessened to some degree, EMS was called. En route, he was given nitroglycerin sublingually x2 and by the  time he came in here, it had significantly improved.   He has no chest pain or pressure now. Hospitalist services were contacted for further evaluation and management.   PAST MEDICAL HISTORY:  1.  Diabetes.  2.  History of nephrolithiasis.  3.  Shoulder surgery.  4.  Neck surgery and fusion.  5.  Herniated disk.  6.  Right-sided radiculopathy. Sees a pain specialist as an outpatient.  7.  Hypertension.   ALLERGIES: CODEINE CAUSING ITCHING.   SOCIAL HISTORY: No tobacco. Occasional alcohol. No drug use. Works as a Warehouse manager.    FAMILY HISTORY: Mother with MI and emphysema. Hypertension and diabetes also run in the family.   OUTPATIENT MEDICATIONS: Crestor 10 mg tab. The patient takes this times a couple times a week. Invokana 300 mg once a day, metformin 500 mg two times a day, oxycodone 5 mg three  times a day as needed for pain, phentermine 30 mg capsule once a day, potassium citrate 10 mEq extended-release two tabs 2 times a day, ramipril 5 mg daily. Victoza 18 mg injectable, 1.2 mL  once a day.   REVIEW OF SYSTEMS:  CONSTITUTIONAL: No fever, fatigue or weight changes.  EYES: Has  chronic blurry vision.  ENT: No tinnitus or hearing loss and no postnasal drip.  RESPIRATORY: No cough. Was somewhat short of breath a couple nights lasting about 10 minutes, better with cough. No shortness of breath now. No history of COPD or asthma. No painful respirations.  CARDIOVASCULAR: Chest pressure as above. None now. No orthopnea or swelling in the legs. Did have a fluttering in his chest a month ago, lasted about 10 minutes or so, resolved.  GASTROINTESTINAL: No nausea, vomiting, diarrhea, abdominal pain, hematemesis, dark stools, black stools   GENITOURINARY: Denies dysuria, hematuria, frequency.  HEMOLYMPH: No anemia or easy bruising.  SKIN: No rashes.  MUSCULOSKELETAL: Denies gout but does have radicular pain and chronic back pain.  NEUROLOGIC: Has radicular pain in the right lower extremity. No focal weakness or numbness.  PSYCHIATRIC: No anxiety or insomnia.   PHYSICAL EXAMINATION:  VITAL SIGNS: Temperature on arrival 98.1. Initial pulse rate 89, respiratory rate 20, blood pressure 130/81. Oxygen saturation was 98% on room air.  GENERAL: A morbidly obese Caucasian male lying in bed, no obvious distress.  HEENT: Normocephalic, atraumatic. Pupils are equal and reactive. Anicteric sclerae. Extraocular muscles intact. Moist mucous membranes.  NECK: Very thick, obese neck but no cervical lymphadenopathy or thyroid tenderness.  CARDIOVASCULAR: S1 and S2 regular rate and rhythm. No murmurs, rubs or gallops.  LUNGS: Clear to auscultation. No wheezing, rhonchi or rales.  ABDOMEN: Soft, obese, nontender. Unable to appreciate any organomegaly.  EXTREMITIES: No significant lower extremity edema.  NEUROLOGIC: Cranial nerves II through XII grossly intact. Strength is five out of five in all extremities. Sensation is intact to light touch.  PSYCHIATRIC: Awake, alert, oriented, pleasant, cooperative.   LABORATORY AND RADIOLOGICAL DATA: Glucose 124, BUN 18, creatinine 0.93. Initial sodium was  131 and potassium of 6.6, but I think those hemolyzed and upon repeat examination, sodium was 134, potassium 4.5. Repeat chloride was 103. LFTs showed AST 57, ALT 85, total bilirubin 0.5 and alk phos of 109. Initial troponin negative. Initial WBC was 8.7. Hemoglobin was 15.4. Platelets are 186. UA not suggestive of infection.   EKG: Sinus rhythm with PVC. No acute ST elevations or depressions. There are T-wave inversions in III. No previous EKGs to compare this to.   Chest x-ray, one view, shows no acute disease of the chest.   ASSESSMENT AND PLAN: We have a 54 year old male with hypertension, diabetes, obesity, who comes in with chest pressure with nausea, dizziness and cold sweats, improved with nitroglycerin with several risk factors for premature coronary artery disease.   This is concerning for unstable angina, but he has a negative troponin and no acute ST changes on the EKG. At this point, I would admit the patient for observation to the telemetry and rule out acute coronary syndrome with cyclic cardiac markers and if he does rule out, a stress test will be will be done tomorrow. Would also order an echocardiogram. I would start the patient on aspirin and also a beta blocker, morphine, and nitroglycerin paste and oxygen. Will risk stratify the patient by obtaining lipid profile with hemoglobin A1c as well.   He is on Crestor, but he takes that twice weekly per patient. He does have a mild elevation of transaminases. As he takes it infrequently, we would hold that for now and see what the lipid profiles show and perhaps we can recheck LFTs as an outpatient.   Would hold the oral diabetes medications and start the patient on a sliding-scale insulin. I would continue the ACE inhibitor. I would hold potassium replacement at this point as his potassium is  normal.   I will start him on heparin for deep vein thrombosis prophylaxis. Continue his phentermine.   CODE STATUS: FULL CODE.   TOTAL TIME  SPENT: 45 minutes.   ____________________________ Vivien Presto, MD sa:np D: 08/30/2012 14:29:17 ET T: 08/30/2012 17:33:37 ET JOB#: [id number]  cc: Vivien Presto, MD, <Dictator> Fonnie Jarvis. Ilene Qua, MD Vivien Presto MD ELECTRONICALLY SIGNED 09/09/2012 12:32

## 2014-05-09 NOTE — Discharge Summary (Signed)
PATIENT NAME:  Adam Petty, Adam Petty MR#:  [id number] DATE OF BIRTH:  December 21, 1960  DATE OF ADMISSION:  08/30/2012 DATE OF DISCHARGE:  08/31/2012  ADMITTING DIAGNOSIS: Chest pain.   DISCHARGE DIAGNOSES: 1. Chest pain of unclear etiology at this time. Negative cardiac enzymes for injury. Negative Myoview stress test. 2.  Diabetes mellitus with Hemoglobin A1c of 9.2. 3.  Hyperlipidemia. 4.  Hypertriglyceridemia.  5.  Obesity.  6.  Hypertension.   DISCHARGE CONDITION: Stable.   DISCHARGE MEDICATIONS: The patient is to resume his outpatient medications which are: 1.  Victoza 1.2 mL subcutaneous injection, which would be 18 mg in 3 mL injection. The patient will use 1.2 mL subcutaneously once daily injection.  2.  Ramipril 5 mg p.o. daily.  3.  Oxycodone 5 mg 1 to 3 tablets as needed a day. 4.  Crestor 10 mg p.o. daily.  5.  Invokana 300 mg p.o. daily.  6.  Potassium citrate 10 mEq p.o. 2 tablets twice daily.  7.  Phentermine 30 mg once daily.  8.  Metformin 1 gram twice daily. This is new dose.  9.  Lovaza 1000 mg oral capsule 2 capsules twice a day. This is new medication.  HOME OXYGEN: None.   DIET: 2 gram salt, low fat, low cholesterol, carbohydrate-controlled diet, regular consistency.   ACTIVITY LIMITATIONS: As tolerated.   DISCHARGE FOLLOWUP: With Dr. Beckey Downing in 2 days after discharge.  CONSULTANTS: None.   RADIOLOGIC STUDIES: Chest x-ray, portable single view, 08/30/2012, showed no acute disease of the chest. Myoview stress test, 08/31/2012, revealed no significant wall motion abnormality noted. Pharmacological myocardial perfusion study with no significant ischemia. The estimated ejection fraction is 55%. There are no EKG changes concerning for ischemia. There is no artifact noted on this study. Diagnosing physician is Dr. Harold Hedge.  HISTORY AND HOSPITAL COURSE: The patient is a 54 year old Caucasian male with a history of diabetes and hypertension as well as hyperlipidemia  who presents to the hospital with complaints of chest pressure. Please refer to Dr. Lupe Carney admission note on 08/30/2012.   On arrival to the hospital, the patient's temperature was 98.1, pulse was 89, respiratory rate was 20, blood pressure 130/81 and saturation was 98% on room air. Physical examination was unremarkable.   The patient's EKG showed sinus rhythm with no acute ST-T changes noted. T wave inversion in III was noted, but no prior EKG was available. The patient's lab data showed a low sodium at 131, potassium was 6.6, glucose 131, otherwise BMP was unremarkable. Repeated however, an hour later, revealed potassium level normal at 4.5 and sodium 134 and blood glucose level of 124. The patient's liver enzymes done early in the morning revealed elevation of AST to 101 and ALT of 85.  Repeated lab studies done an hour later showed mild elevation of AST to 57 and ALT to 85. CK total was also elevated at 309, MB fraction normal at 2.2, and troponin was less than 0.02. TSH was normal at 2.32. CBC was within normal limits. Urinalysis was unremarkable.   The patient was admitted to the hospital for further evaluation. His cardiac enzymes were cycled and did not show any kind of changes. He underwent stress test on 08/31/2012 which was unremarkable for inducible ischemia. The patient was also evaluated for other risk factors such as hyperlipidemia. His lipid panel was performed while he was in the hospital. Unfortunately, LDL cannot be calculated due to significant hypertriglyceridemia of 405. The patient's total cholesterol was 162.  Triglycerides, as  mentioned above, were 405 and HDL was 43. The patient's Hemoglobin A1c also revealed significant abnormalities in diabetes control of 9.2. It was felt that the patient's chest pain was unlikely cardiac, however, an exact etiology was not found. As mentioned above, the patient had negative cardiac enzymes for injury and his Myoview was unremarkable. In regards  to diabetes mellitus type 2, the patient was advised to continue his medications, however, his metformin dose was advanced to 1 gram twice daily dose. It may be necessary to advance this medication even higher or add even other medications if needed. Tighter oral intake was also recommended. Low calorie as well as diabetic diet was also recommended. That was discussed with the patient's wife. In regards to hyperlipidemia, hypertriglyceridemia especially, it was felt that the patient's hypertriglyceridemia was related to poor diabetes control. Lovaza was recommended for hypertriglyceridemia. In regards to obesity, the patient is to continue phentermine.  For hypertension, the patient's blood pressure was well controlled. He is to continue Ramipril. The patient is being discharged in stable condition with the above-mentioned medications and follow-up. His vital signs on the day of discharge: Temperature was 97.3, pulse was 88, respiratory rate was 18, blood pressure 104/71 and saturation was 96% on room air at rest.   TIME SPENT: 40 minutes. ____________________________ Adam Caperima Holleigh Crihfield, MD rv:sb D: 08/31/2012 16:09:53 ET T: 08/31/2012 16:48:37 ET JOB#: [id number]  cc: Adam Caperima Nakaiya Beddow, MD, <Dictator> Adam GuildGlenn R. Beckey DowningWillett, MD Adam CaperIMA Myrle Dues MD ELECTRONICALLY SIGNED 09/05/2012 12:44

## 2014-10-22 ENCOUNTER — Ambulatory Visit: Payer: Worker's Compensation | Attending: Pain Medicine | Admitting: Pain Medicine

## 2014-10-22 ENCOUNTER — Encounter: Payer: Self-pay | Admitting: Pain Medicine

## 2014-10-22 VITALS — BP 141/75 | HR 88 | Temp 98.4°F | Resp 18 | Ht 66.0 in | Wt 242.0 lb

## 2014-10-22 DIAGNOSIS — T402X5A Adverse effect of other opioids, initial encounter: Secondary | ICD-10-CM | POA: Insufficient documentation

## 2014-10-22 DIAGNOSIS — M48061 Spinal stenosis, lumbar region without neurogenic claudication: Secondary | ICD-10-CM

## 2014-10-22 DIAGNOSIS — G8929 Other chronic pain: Secondary | ICD-10-CM | POA: Insufficient documentation

## 2014-10-22 DIAGNOSIS — Z5181 Encounter for therapeutic drug level monitoring: Secondary | ICD-10-CM | POA: Diagnosis not present

## 2014-10-22 DIAGNOSIS — Z9889 Other specified postprocedural states: Secondary | ICD-10-CM | POA: Insufficient documentation

## 2014-10-22 DIAGNOSIS — F1120 Opioid dependence, uncomplicated: Secondary | ICD-10-CM

## 2014-10-22 DIAGNOSIS — M179 Osteoarthritis of knee, unspecified: Secondary | ICD-10-CM

## 2014-10-22 DIAGNOSIS — M1711 Unilateral primary osteoarthritis, right knee: Secondary | ICD-10-CM | POA: Insufficient documentation

## 2014-10-22 DIAGNOSIS — Z79891 Long term (current) use of opiate analgesic: Secondary | ICD-10-CM | POA: Diagnosis not present

## 2014-10-22 DIAGNOSIS — E669 Obesity, unspecified: Secondary | ICD-10-CM | POA: Insufficient documentation

## 2014-10-22 DIAGNOSIS — M25561 Pain in right knee: Secondary | ICD-10-CM | POA: Insufficient documentation

## 2014-10-22 DIAGNOSIS — G894 Chronic pain syndrome: Secondary | ICD-10-CM | POA: Diagnosis not present

## 2014-10-22 DIAGNOSIS — M47816 Spondylosis without myelopathy or radiculopathy, lumbar region: Secondary | ICD-10-CM | POA: Insufficient documentation

## 2014-10-22 DIAGNOSIS — K5903 Drug induced constipation: Secondary | ICD-10-CM | POA: Insufficient documentation

## 2014-10-22 DIAGNOSIS — M4726 Other spondylosis with radiculopathy, lumbar region: Secondary | ICD-10-CM | POA: Insufficient documentation

## 2014-10-22 DIAGNOSIS — M5416 Radiculopathy, lumbar region: Secondary | ICD-10-CM | POA: Insufficient documentation

## 2014-10-22 DIAGNOSIS — M1990 Unspecified osteoarthritis, unspecified site: Secondary | ICD-10-CM

## 2014-10-22 DIAGNOSIS — M549 Dorsalgia, unspecified: Secondary | ICD-10-CM | POA: Diagnosis present

## 2014-10-22 DIAGNOSIS — M539 Dorsopathy, unspecified: Secondary | ICD-10-CM

## 2014-10-22 DIAGNOSIS — M4806 Spinal stenosis, lumbar region: Secondary | ICD-10-CM | POA: Insufficient documentation

## 2014-10-22 DIAGNOSIS — M5417 Radiculopathy, lumbosacral region: Secondary | ICD-10-CM | POA: Insufficient documentation

## 2014-10-22 DIAGNOSIS — F1190 Opioid use, unspecified, uncomplicated: Secondary | ICD-10-CM | POA: Insufficient documentation

## 2014-10-22 DIAGNOSIS — M545 Low back pain: Secondary | ICD-10-CM | POA: Diagnosis not present

## 2014-10-22 DIAGNOSIS — M961 Postlaminectomy syndrome, not elsewhere classified: Secondary | ICD-10-CM | POA: Insufficient documentation

## 2014-10-22 DIAGNOSIS — Z6841 Body Mass Index (BMI) 40.0 and over, adult: Secondary | ICD-10-CM | POA: Insufficient documentation

## 2014-10-22 DIAGNOSIS — M25562 Pain in left knee: Secondary | ICD-10-CM | POA: Diagnosis present

## 2014-10-22 DIAGNOSIS — M5126 Other intervertebral disc displacement, lumbar region: Secondary | ICD-10-CM | POA: Insufficient documentation

## 2014-10-22 HISTORY — DX: Chronic pain syndrome: G89.4

## 2014-10-22 HISTORY — DX: Unspecified osteoarthritis, unspecified site: M19.90

## 2014-10-22 MED ORDER — GABAPENTIN 600 MG PO TABS: 600.0000 mg | ORAL_TABLET | Freq: Four times a day (QID) | ORAL | Status: DC

## 2014-10-22 MED ORDER — OXYCODONE HCL 5 MG PO TABS: 5.0000 mg | ORAL_TABLET | Freq: Four times a day (QID) | ORAL | Status: DC | PRN

## 2014-10-22 NOTE — Progress Notes (Signed)
Safety precautions to be maintained throughout the outpatient stay will include: orient to surroundings, keep bed in low position, maintain call bell within reach at all times, provide assistance with transfer out of bed and ambulation. Oxycodone pill count #28

## 2014-10-22 NOTE — Progress Notes (Signed)
Patient's Name: Adam Petty. MRN: [id number] DOB: 10-Mar-1960 DOS: 10/22/2014 Primary Care Physician: No primary care provider on file. Location: [facility] Note by: Quintavia Rogstad A. Laban Emperor, M.D, DABA, DABAPM, DABPM, DABIPP, FIPP  Primary Reason(s) for Visit: Encounter for Medication Management. CC: Back Pain and Knee Pain  HPI:   Adam Petty is a 54 y.o. year old, male patient, seen today for evaluation and possible adjustment of analgesic pharmacological regimen. He has Spinal stenosis of lumbar region; Lateral meniscal tear; Chronic low back pain; Chronic pain syndrome; Long term current use of opiate analgesic; Encounter for therapeutic drug level monitoring; Opiate use; Failed back surgical syndrome; Discogenic syndrome, lumbar; Osteoarthritis of spine with radiculopathy, lumbar region; Facet syndrome, lumbar; Lumbosacral radiculopathy; Pain in right knee; Arthritis, senescent; Obesity, Class III, BMI 40-49.9 (morbid obesity) (HCC); Uncomplicated opioid dependence (HCC); Therapeutic opioid-induced constipation (OIC); Dyslipidemia; Microalbuminuria; Adiposity; Lumbar canal stenosis; and Type 2 diabetes mellitus (HCC) on his problem list..The Patient is scheduled to return today for evaluation of analgesic regimen and possible medication adjustment(s). The patient complains primarily of Back Pain and Knee Pain    Symptoms: Patient confirms worsening when therapy is absent or decreased. Pharmacotherapy Review: Side-effects or Adverse reactions: None reported. Effectiveness: Described as relatively effective, allowing for increase in activities of daily living (ADL). Onset of action: Within normal limits for current pharmacological therapy. Duration: Within normal limits for medication. Peak effect: Timing and results are as within normal expected parameters. [place] PMP: Compliant with practice rules and regulations. DST: Compliant with practice rules and  regulations. Lab work: No new labs ordered by our practice. Treatment compliance: Compliant. Substance Use Disorder (SUD) Risk Level: Low Planned course of action: Continue therapy as is. Allergies: Adam Petty has No Known Allergies. Meds: The patient has a current medication list which includes the following prescription(s): canagliflozin, gabapentin, liraglutide, metformin, oxycodone, ramipril, rosuvastatin, docusate sodium, lubiprostone, methocarbamol, OVER THE COUNTER MEDICATION, oxycodone, and phentermine.  Requested Prescriptions   Signed Prescriptions Disp Refills  . oxyCODONE (OXY IR/ROXICODONE) 5 MG immediate release tablet 120 tablet 0    Sig: Take 1 tablet (5 mg total) by mouth every 6 (six) hours as needed for severe pain.  Marland Kitchen oxyCODONE (OXY IR/ROXICODONE) 5 MG immediate release tablet 120 tablet 0    Sig: Take 1 tablet (5 mg total) by mouth every 6 (six) hours as needed for severe pain.  Marland Kitchen gabapentin (NEURONTIN) 600 MG tablet 120 tablet 1    Sig: Take 1 tablet (600 mg total) by mouth every 6 (six) hours.   ROS: Constitutional: Afebrile, no chills, well hydrated and well nourished Gastrointestinal: negative Musculoskeletal:negative Neurological: negative Behavioral/Psych: negative PFSH: Medical:  Adam Petty  has a past medical history of Back pain; IBS (irritable bowel syndrome); History of kidney stones; Diabetes mellitus without complication (HCC); Preventative health care; and Lateral meniscus tear. Family: family history includes COPD in his mother; Cancer in his mother; Diabetes in his father; Heart disease in his father and mother. Surgical:  has past surgical history that includes Tonsillectomy; Shoulder surgery; Palate surgery; Cervical fusion; Foot surgery; Lumbar laminectomy/decompression microdiscectomy (N/A, 02/06/2013); and Knee arthroscopy (Right, 10/02/2013). Tobacco:  reports that he has never smoked. He does not have any smokeless tobacco history on  file. Alcohol:  reports that he drinks alcohol. Drug:  reports that he does not use illicit drugs. Physical Exam: Vitals:  Today's Vitals   10/22/14 0826 10/22/14 0828  BP: 141/75   Pulse: 88   Temp: 98.4  F (36.9 C)   TempSrc: Oral   Resp: 18   Height:  (1.676 m)   Weight: 242 lb (109.77 kg)   SpO2: 98%   PainSc: 6  6   PainLoc: Back    Calculated BMI: Body mass index is 39.08 kg/(m^2). General appearance: alert, cooperative, no distress and morbidly obese Eyes: conjunctivae/corneas clear. PERRL, EOM's intact. Fundi benign. Neck: no adenopathy, no carotid bruit, no JVD, supple, symmetrical, trachea midline and thyroid not enlarged, symmetric, no tenderness/mass/nodules Back: symmetric, no curvature. ROM normal. No CVA tenderness. Lungs: No evidence respiratory distress, no audible rales or ronchi and no use of accessory muscles of respiration Extremities: extremities normal, atraumatic, no cyanosis or edema Pulses: 2+ and symmetric Skin: Skin color, texture, turgor normal. No rashes or lesions Neurologic: Grossly normal  Assessment: Encounter Diagnosis:  Primary Diagnosis: Chronic low back pain [M54.5, G89.29] Plan: Adam Petty was seen today for back pain and knee pain.  Diagnoses and all orders for this visit:  Chronic low back pain  Chronic pain syndrome  Long term current use of opiate analgesic  Encounter for therapeutic drug level monitoring  Opiate use  Failed back surgical syndrome  Discogenic syndrome, lumbar  Osteoarthritis of spine with radiculopathy, lumbar region  Facet syndrome, lumbar  Spinal stenosis of lumbar region  Lumbosacral radiculopathy  Pain in right knee  Osteoarthritis of right knee, unspecified osteoarthritis type  Obesity, Class III, BMI 40-49.9 (morbid obesity) (HCC)  Uncomplicated opioid dependence (HCC)  Therapeutic opioid-induced constipation (OIC)  Other orders -     oxyCODONE (OXY IR/ROXICODONE) 5 MG immediate  release tablet; Take 1 tablet (5 mg total) by mouth every 6 (six) hours as needed for severe pain. -     oxyCODONE (OXY IR/ROXICODONE) 5 MG immediate release tablet; Take 1 tablet (5 mg total) by mouth every 6 (six) hours as needed for severe pain. -     gabapentin (NEURONTIN) 600 MG tablet; Take 1 tablet (600 mg total) by mouth every 6 (six) hours.   Patient instructions:  There are no Patient Instructions on file for this visit.  Medications discontinued today:  Medications Discontinued During This Encounter  Medication Reason  . oxyCODONE (OXY IR/ROXICODONE) 5 MG immediate release tablet Reorder  . oxyCODONE (OXY IR/ROXICODONE) 5 MG immediate release tablet Reorder  . gabapentin (NEURONTIN) 600 MG tablet Reorder    Medications administered today:  Adam Petty had no medications administered during this visit.  Images attached to encounter:  No images are attached to the encounter.  Scans attached to encounter:  No scans are attached to the encounter.

## 2014-11-11 ENCOUNTER — Other Ambulatory Visit: Payer: Self-pay | Admitting: Pain Medicine

## 2014-12-22 ENCOUNTER — Ambulatory Visit: Payer: Worker's Compensation | Attending: Pain Medicine | Admitting: Pain Medicine

## 2014-12-22 ENCOUNTER — Encounter: Payer: Self-pay | Admitting: Pain Medicine

## 2014-12-22 ENCOUNTER — Other Ambulatory Visit: Payer: Self-pay | Admitting: Pain Medicine

## 2014-12-22 VITALS — BP 132/75 | HR 81 | Temp 98.4°F | Resp 18 | Ht 66.0 in | Wt 245.0 lb

## 2014-12-22 DIAGNOSIS — M792 Neuralgia and neuritis, unspecified: Secondary | ICD-10-CM

## 2014-12-22 DIAGNOSIS — M47816 Spondylosis without myelopathy or radiculopathy, lumbar region: Secondary | ICD-10-CM

## 2014-12-22 DIAGNOSIS — Z79891 Long term (current) use of opiate analgesic: Secondary | ICD-10-CM

## 2014-12-22 DIAGNOSIS — Z5181 Encounter for therapeutic drug level monitoring: Secondary | ICD-10-CM | POA: Diagnosis not present

## 2014-12-22 DIAGNOSIS — T402X5A Adverse effect of other opioids, initial encounter: Secondary | ICD-10-CM

## 2014-12-22 DIAGNOSIS — M539 Dorsopathy, unspecified: Secondary | ICD-10-CM

## 2014-12-22 DIAGNOSIS — K5903 Drug induced constipation: Secondary | ICD-10-CM

## 2014-12-22 DIAGNOSIS — F1120 Opioid dependence, uncomplicated: Secondary | ICD-10-CM

## 2014-12-22 DIAGNOSIS — M7918 Myalgia, other site: Secondary | ICD-10-CM

## 2014-12-22 DIAGNOSIS — G8929 Other chronic pain: Secondary | ICD-10-CM | POA: Diagnosis not present

## 2014-12-22 DIAGNOSIS — M791 Myalgia: Secondary | ICD-10-CM

## 2014-12-22 DIAGNOSIS — M961 Postlaminectomy syndrome, not elsewhere classified: Secondary | ICD-10-CM

## 2014-12-22 DIAGNOSIS — Z7189 Other specified counseling: Secondary | ICD-10-CM

## 2014-12-22 DIAGNOSIS — M545 Low back pain: Secondary | ICD-10-CM

## 2014-12-22 DIAGNOSIS — F1190 Opioid use, unspecified, uncomplicated: Secondary | ICD-10-CM | POA: Diagnosis not present

## 2014-12-22 DIAGNOSIS — Z79899 Other long term (current) drug therapy: Secondary | ICD-10-CM

## 2014-12-22 MED ORDER — BENEFIBER PO POWD: ORAL | Status: DC

## 2014-12-22 MED ORDER — OXYCODONE HCL 5 MG PO TABS: 5.0000 mg | ORAL_TABLET | Freq: Four times a day (QID) | ORAL | Status: DC | PRN

## 2014-12-22 MED ORDER — DOCUSATE SODIUM 100 MG PO CAPS: 100.0000 mg | ORAL_CAPSULE | Freq: Two times a day (BID) | ORAL | Status: DC

## 2014-12-22 MED ORDER — BISACODYL 5 MG PO TBEC: 10.0000 mg | DELAYED_RELEASE_TABLET | Freq: Every day | ORAL | Status: DC | PRN

## 2014-12-22 MED ORDER — LUBIPROSTONE 24 MCG PO CAPS: 24.0000 ug | ORAL_CAPSULE | Freq: Every day | ORAL | Status: DC

## 2014-12-22 MED ORDER — GABAPENTIN 600 MG PO TABS: 600.0000 mg | ORAL_TABLET | Freq: Four times a day (QID) | ORAL | Status: DC

## 2014-12-22 NOTE — Progress Notes (Signed)
Patient's Name: Adam Petty. MRN: [id number] DOB: Jun 13, 1960 DOS: 12/22/2014  Primary Reason(s) for Visit: Encounter for Medication Management CC: Neck Pain; Back Pain; Shoulder Pain; and Knee Pain   HPI:   Adam Petty is a 54 y.o. year old, male patient, who returns today as an established patient. 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); Discogenic syndrome, lumbar (WC injury); Osteoarthritis of spine with radiculopathy, lumbar region (WC injury); Facet syndrome, lumbar (WC injury); Lumbosacral radiculopathy (WC injury); Pain in right knee (WC injury); Obesity, Class III, BMI 40-49.9 (morbid obesity) (HCC); Uncomplicated opioid dependence (HCC); Therapeutic opioid-induced constipation (OIC); Dyslipidemia; Microalbuminuria; Adiposity; Lumbar canal stenosis; Type 2 diabetes mellitus (HCC); Chronic pain (WC injury); Long term prescription opiate use; Encounter for chronic pain management; Neurogenic pain; Neuropathic pain; and Musculoskeletal pain on his problem list.. His primarily concern today is the Neck Pain; Back Pain; Shoulder Pain; and Knee Pain     The patient comes into the clinic today for follow-up evaluation. He is having some flareup of his low back pain and has requested a repeat lumbar facet block. We will go ahead and schedule that as soon as possible. He continues to work. Unfortunately he still overweight and this is probably contributing to his pain.  Today's Pain Score: 8 , clinically he looks like a 4-5/10. Reported level of pain is incompatible with clinical obrservations. This may be secondary to a possible lack of understanding on how the pain scale works. Pain Type: Chronic pain Pain Location: Back (shoulder, knee) Pain Orientation: Lower Pain Descriptors /  Indicators: Constant, Sharp Pain Frequency: Constant  Date of Last Visit: 10/22/14 Service Provided on Last Visit: Med Refill  Pharmacotherapy Review:   Side-effects or Adverse reactions: None reported. Effectiveness: Described as relatively effective, allowing for increase in activities of daily living (ADL). Onset of action: Within expected pharmacological parameters. Duration of action: Within normal limits for medication. Peak effect: Timing and results are as within normal expected parameters. [place] PMP: Compliant with practice rules and regulations. UDS Results: Within normal limits. UDS Interpretation: Patient appears to be compliant with practice rules and regulations. Medication Assessment Form: Reviewed. Patient indicates being compliant with therapy Treatment compliance: Compliant. Substance Use Disorder (SUD) Risk Level: Low Pharmacologic Plan: Continue therapy as is.  Last Available Lab Work: No visits with results within 3 Month(s) from this visit. Latest known visit with results is:  Admission on 10/02/2013, Discharged on 10/02/2013  Component Date Value Ref Range Status  . Glucose-Capillary 10/02/2013 140* 70 - 99 mg/dL Final  . Glucose-Capillary 10/02/2013 141* 70 - 99 mg/dL Final  . Comment 1 95/[id number]/4132 Documented in Chart   Final  . Comment 2 10/02/2013 Notify RN   Final    Allergies: Adam Petty has No Known Allergies.  Meds: The patient has a current medication list which includes the following prescription(s): canagliflozin, docusate sodium, gabapentin, liraglutide, metformin, OVER THE COUNTER MEDICATION, oxycodone, oxycodone, ramipril, rosuvastatin, bisacodyl, lubiprostone, oxycodone, and benefiber. Requested Prescriptions   Signed Prescriptions Disp Refills  . oxyCODONE (OXY IR/ROXICODONE) 5 MG immediate release tablet 120 tablet 0    Sig: Take 1 tablet (5 mg total) by mouth every 6 (six) hours as needed for moderate pain or severe pain.  Marland Kitchen oxyCODONE  (OXY IR/ROXICODONE) 5 MG immediate release tablet 120 tablet 0  Sig: Take 1 tablet (5 mg total) by mouth every 6 (six) hours as needed for moderate pain or severe pain.  Marland Kitchen. oxyCODONE (OXY IR/ROXICODONE) 5 MG immediate release tablet 120 tablet 0    Sig: Take 1 tablet (5 mg total) by mouth every 6 (six) hours as needed for moderate pain or severe pain.  Marland Kitchen. gabapentin (NEURONTIN) 600 MG tablet 120 tablet 2    Sig: Take 1 tablet (600 mg total) by mouth every 6 (six) hours.  . bisacodyl (DULCOLAX) 5 MG EC tablet 30 tablet PRN    Sig: Take 2 tablets (10 mg total) by mouth daily as needed for moderate constipation.  . Wheat Dextrin (BENEFIBER) POWD 500 g PRN    Sig: Stir 2 tsp. TID into 4-8 oz of any non-carbonated beverage or soft food (hot or cold)  . docusate sodium (COLACE) 100 MG capsule 60 capsule PRN    Sig: Take 1 capsule (100 mg total) by mouth 2 (two) times daily.  Marland Kitchen. lubiprostone (AMITIZA) 24 MCG capsule 30 capsule PRN    Sig: Take 1 capsule (24 mcg total) by mouth daily with breakfast. Swallow the medication whole. Do not break or chew the medication.    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 Family: family history includes COPD in his mother; Cancer in his mother; Diabetes in his father; Heart disease in his father and mother. Surgical:  has past surgical history that includes Tonsillectomy; Shoulder surgery; Palate surgery; Cervical fusion; Foot surgery; Lumbar laminectomy/decompression microdiscectomy (N/A, 02/06/2013); and Knee arthroscopy (Right,  10/02/2013). Tobacco:  reports that he has never smoked. He does not have any smokeless tobacco history on file. Alcohol:  reports that he drinks alcohol. Drug:  reports that he does not use illicit drugs.  Physical Exam: Vitals:  Today's Vitals   12/22/14 0800 12/22/14 0801  BP: 132/75   Pulse: 81   Temp: 98.4 F (36.9 C)   Resp: 18   Height: 5\' 6"  (1.676 m)   Weight: 245 lb (111.131 kg)   SpO2: 100%   PainSc: 8  8   PainLoc: Back   Calculated BMI: Body mass index is 39.56 kg/(m^2). General appearance: alert, cooperative, appears stated age, moderate distress and morbidly obese Eyes: conjunctivae/corneas clear. PERRL, EOM's intact. Fundi benign. Lungs: No evidence respiratory distress, no audible rales or ronchi and no use of accessory muscles of respiration Neck: no adenopathy, no carotid bruit, no JVD, supple, symmetrical, trachea midline and thyroid not enlarged, symmetric, no tenderness/mass/nodules Lumbar Spine Palpable Trigger Points: Negative bilaterally Lumbar Hyperextension and rotation: Right side positive Patrick's Maneuver: Negative bilaterally Lower Extremities ROM: Adequate bilaterally Gait: Antalgic PT & DP Pulses: Palpable bilaterally Skin: Skin color, texture, turgor normal. No rashes or lesions Neurologic: Gait: Antalgic    Assessment: Encounter Diagnosis:  Primary Diagnosis: Chronic pain [G89.29]  Plan: Adam Petty was seen today for neck pain, back pain, shoulder pain and knee pain.  Diagnoses and all orders for this visit:  Chronic pain -     COMPLETE METABOLIC PANEL WITH GFR; Future -     Hepatic function panel; Future -     Magnesium; Future -     Sedimentation rate; Future -  Vitamin D2,D3 Panel; Future -     oxyCODONE (OXY IR/ROXICODONE) 5 MG immediate release tablet; Take 1 tablet (5 mg total) by mouth every 6 (six) hours as needed for moderate pain or severe pain. -     oxyCODONE (OXY IR/ROXICODONE) 5 MG immediate release tablet; Take 1  tablet (5 mg total) by mouth every 6 (six) hours as needed for moderate pain or severe pain. -     oxyCODONE (OXY IR/ROXICODONE) 5 MG immediate release tablet; Take 1 tablet (5 mg total) by mouth every 6 (six) hours as needed for moderate pain or severe pain.  Encounter for therapeutic drug level monitoring  Long term current use of opiate analgesic -     Drugs of abuse screen w/o alc, rtn urine-sln  Opiate use  Therapeutic opioid-induced constipation (OIC) -     Discontinue: docusate sodium (COLACE) 100 MG capsule; Take 1 capsule (100 mg total) by mouth 2 (two) times daily. -     bisacodyl (DULCOLAX) 5 MG EC tablet; Take 2 tablets (10 mg total) by mouth daily as needed for moderate constipation. -     Wheat Dextrin (BENEFIBER) POWD; Stir 2 tsp. TID into 4-8 oz of any non-carbonated beverage or soft food (hot or cold) -     docusate sodium (COLACE) 100 MG capsule; Take 1 capsule (100 mg total) by mouth 2 (two) times daily. -     lubiprostone (AMITIZA) 24 MCG capsule; Take 1 capsule (24 mcg total) by mouth daily with breakfast. Swallow the medication whole. Do not break or chew the medication.  Uncomplicated opioid dependence (HCC)  Long term prescription opiate use  Encounter for chronic pain management  Failed back surgical syndrome  Facet syndrome, lumbar (WC injury) -     LUMBAR FACET(MEDIAL BRANCH NERVE BLOCK) MBNB; Future  Chronic low back pain (WC injury) -     LUMBAR FACET(MEDIAL BRANCH NERVE BLOCK) MBNB; Future  Neurogenic pain -     gabapentin (NEURONTIN) 600 MG tablet; Take 1 tablet (600 mg total) by mouth every 6 (six) hours.  Neuropathic pain -     gabapentin (NEURONTIN) 600 MG tablet; Take 1 tablet (600 mg total) by mouth every 6 (six) hours.  Musculoskeletal pain     Patient Instructions   Facet Joint Block The facet joints connect the bones of the spine (vertebrae). They make it possible for you to bend, twist, and make other movements with your spine. They  also prevent you from overbending, overtwisting, and making other excessive movements.  A facet joint block is a procedure where a numbing medicine (anesthetic) is injected into a facet joint. Often, a type of anti-inflammatory medicine called a steroid is also injected. A facet joint block may be done for two reasons:  1. Diagnosis. A facet joint block may be done as a test to see whether neck or back pain is caused by a worn-down or infected facet joint. If the pain gets better after a facet joint block, it means the pain is probably coming from the facet joint. If the pain does not get better, it means the pain is probably not coming from the facet joint.  2. Therapy. A facet joint block may be done to relieve neck or back pain caused by a facet joint. A facet joint block is only done as a therapy if the pain does not improve with medicine, exercise programs, physical therapy, and other forms of pain management. LET [facility] CARE PROVIDER KNOW ABOUT:  Any allergies you have.   All medicines you are taking, including vitamins, herbs, eyedrops, and over-the-counter medicines and creams.   Previous problems you or members of your family have had with the use of anesthetics.   Any blood disorders you have had.   Other health problems you have. RISKS AND COMPLICATIONS Generally, having a facet joint block is safe. However, as with any procedure, complications can occur. Possible complications associated with having a facet joint block include:   Bleeding.   Injury to a nerve near the injection site.   Pain at the injection site.   Weakness or numbness in areas controlled by nerves near the injection site.   Infection.   Temporary fluid retention.   Allergic reaction to anesthetics or medicines used during the procedure. BEFORE THE PROCEDURE   Follow your health care provider's instructions if you are taking dietary supplements or medicines. You may need to stop taking  them or reduce your dosage.   Do not take any new dietary supplements or medicines without asking your health care provider first.   Follow your health care provider's instructions about eating and drinking before the procedure. You may need to stop eating and drinking several hours before the procedure.   Arrange to have an adult drive you home after the procedure. PROCEDURE 1. You may need to remove your clothing and dress in an open-back gown so that your health care provider can access your spine.  2. The procedure will be done while you are lying on an X-ray table. Most of the time you will be asked to lie on your stomach, but you may be asked to lie in a different position if an injection will be made in your neck.  3. Special machines will be used to monitor your oxygen levels, heart rate, and blood pressure.  4. If an injection will be made in your neck, an intravenous (IV) tube will be inserted into one of your veins. Fluids and medicine will flow directly into your body through the IV tube.  5. The area over the facet joint where the injection will be made will be cleaned with an antiseptic soap. The surrounding skin will be covered with sterile drapes.  6. An anesthetic will be applied to your skin to make the injection area numb. You may feel a temporary stinging or burning sensation.  7. A video X-ray machine will be used to locate the joint. A contrast dye may be injected into the facet joint area to help with locating the joint.  8. When the joint is located, an anesthetic medicine will be injected into the joint through the needle.  9. Your health care provider will ask you whether you feel pain relief. If you do feel relief, a steroid may be injected to provide pain relief for a longer period of time. If you do not feel relief or feel only partial relief, additional injections of an anesthetic may be made in other facet joints.  10. The needle will be removed, the skin  will be cleansed, and bandages will be applied.  AFTER THE PROCEDURE   You will be observed for 15-30 minutes before being allowed to go home. Do not drive. Have an adult drive you or take a taxi or public transportation instead.   If you feel pain relief, the pain will return in several hours or days when the anesthetic wears off.   You may feel pain relief 2-14 days after the procedure. The amount  of time this relief lasts varies from person to person.   It is normal to feel some tenderness over the injected area(s) for 2 days following the procedure.   If you have diabetes, you may have a temporary increase in blood sugar.   This information is not intended to replace advice given to you by your health care provider. Make sure you discuss any questions you have with your health care provider.   Document Released: 05/25/2006 Document Revised: 01/24/2014 Document Reviewed: 10/24/2011 Elsevier Interactive Patient Education 2016 [employer] GENERAL RISKS AND COMPLICATIONS  What are the risk, side effects and possible complications? Generally speaking, most procedures are safe.  However, with any procedure there are risks, side effects, and the possibility of complications.  The risks and complications are dependent upon the sites that are lesioned, or the type of nerve block to be performed.  The closer the procedure is to the spine, the more serious the risks are.  Great care is taken when placing the radio frequency needles, block needles or lesioning probes, but sometimes complications can occur. 1. Infection: Any time there is an injection through the skin, there is a risk of infection.  This is why sterile conditions are used for these blocks.  There are four possible types of infection. 1. Localized skin infection. 2. Central Nervous System Infection-This can be in the form of Meningitis, which can be deadly. 3. Epidural Infections-This can be in the form of an epidural abscess,  which can cause pressure inside of the spine, causing compression of the spinal cord with subsequent paralysis. This would require an emergency surgery to decompress, and there are no guarantees that the patient would recover from the paralysis. 4. Discitis-This is an infection of the intervertebral discs.  It occurs in about 1% of discography procedures.  It is difficult to treat and it may lead to surgery.        2. Pain: the needles have to go through skin and soft tissues, will cause soreness.       3. Damage to internal structures:  The nerves to be lesioned may be near blood vessels or    other nerves which can be potentially damaged.       4. Bleeding: Bleeding is more common if the patient is taking blood thinners such as  aspirin, Coumadin, Ticiid, Plavix, etc., or if he/she have some genetic predisposition  such as hemophilia. Bleeding into the spinal canal can cause compression of the spinal  cord with subsequent paralysis.  This would require an emergency surgery to  decompress and there are no guarantees that the patient would recover from the  paralysis.       5. Pneumothorax:  Puncturing of a lung is a possibility, every time a needle is introduced in  the area of the chest or upper back.  Pneumothorax refers to free air around the  collapsed lung(s), inside of the thoracic cavity (chest cavity).  Another two possible  complications related to a similar event would include: Hemothorax and Chylothorax.   These are variations of the Pneumothorax, where instead of air around the collapsed  lung(s), you may have blood or chyle, respectively.       6. Spinal headaches: They may occur with any procedures in the area of the spine.       7. Persistent CSF (Cerebro-Spinal Fluid) leakage: This is a rare problem, but may occur  with prolonged intrathecal or epidural catheters either due to the formation of  a fistulous  track or a dural tear.       8. Nerve damage: By working so close to the spinal  cord, there is always a possibility of  nerve damage, which could be as serious as a permanent spinal cord injury with  paralysis.       9. Death:  Although rare, severe deadly allergic reactions known as "Anaphylactic  reaction" can occur to any of the medications used.      10. Worsening of the symptoms:  We can always make thing worse.  What are the chances of something like this happening? Chances of any of this occuring are extremely low.  By statistics, you have more of a chance of getting killed in a motor vehicle accident: while driving to the hospital than any of the above occurring .  Nevertheless, you should be aware that they are possibilities.  In general, it is similar to taking a shower.  Everybody knows that you can slip, hit your head and get killed.  Does that mean that you should not shower again?  Nevertheless always keep in mind that statistics do not mean anything if you happen to be on the wrong side of them.  Even if a procedure has a 1 (one) in a 1,000,000 (million) chance of going wrong, it you happen to be that one..Also, keep in mind that by statistics, you have more of a chance of having something go wrong when taking medications.  Who should not have this procedure? If you are on a blood thinning medication (e.g. Coumadin, Plavix, see list of "Blood Thinners"), or if you have an active infection going on, you should not have the procedure.  If you are taking any blood thinners, please inform your physician.  How should I prepare for this procedure?  Do not eat or drink anything at least six hours prior to the procedure.  Bring a driver with you .  It cannot be a taxi.  Come accompanied by an adult that can drive you back, and that is strong enough to help you if your legs get weak or numb from the local anesthetic.  Take all of your medicines the morning of the procedure with just enough water to swallow them.  If you have diabetes, make sure that you are scheduled  to have your procedure done first thing in the morning, whenever possible.  If you have diabetes, take only half of your insulin dose and notify our nurse that you have done so as soon as you arrive at the clinic.  If you are diabetic, but only take blood sugar pills (oral hypoglycemic), then do not take them on the morning of your procedure.  You may take them after you have had the procedure.  Do not take aspirin or any aspirin-containing medications, at least eleven (11) days prior to the procedure.  They may prolong bleeding.  Wear loose fitting clothing that may be easy to take off and that you would not mind if it got stained with Betadine or blood.  Do not wear any jewelry or perfume  Remove any nail coloring.  It will interfere with some of our monitoring equipment.  NOTE: Remember that this is not meant to be interpreted as a complete list of all possible complications.  Unforeseen problems may occur.  BLOOD THINNERS The following drugs contain aspirin or other products, which can cause increased bleeding during surgery and should not be taken for 2 weeks prior to and 1  week after surgery.  If you should need take something for relief of minor pain, you may take acetaminophen which is found in Tylenol,m Datril, Anacin-3 and Panadol. It is not blood thinner. The products listed below are.  Do not take any of the products listed below in addition to any listed on your instruction sheet.  A.P.C or A.P.C with Codeine Codeine Phosphate Capsules #3 Ibuprofen Ridaura  ABC compound Congesprin Imuran rimadil  Advil Cope Indocin Robaxisal  Alka-Seltzer Effervescent Pain Reliever and Antacid Coricidin or Coricidin-D  Indomethacin Rufen  Alka-Seltzer plus Cold Medicine Cosprin Ketoprofen S-A-C Tablets  Anacin Analgesic Tablets or Capsules Coumadin Korlgesic Salflex  Anacin Extra Strength Analgesic tablets or capsules CP-2 Tablets Lanoril Salicylate  Anaprox Cuprimine Capsules Levenox Salocol   Anexsia-D Dalteparin Magan Salsalate  Anodynos Darvon compound Magnesium Salicylate Sine-off  Ansaid Dasin Capsules Magsal Sodium Salicylate  Anturane Depen Capsules Marnal Soma  APF Arthritis pain formula Dewitt's Pills Measurin Stanback  Argesic Dia-Gesic Meclofenamic Sulfinpyrazone  Arthritis Bayer Timed Release Aspirin Diclofenac Meclomen Sulindac  Arthritis pain formula Anacin Dicumarol Medipren Supac  Analgesic (Safety coated) Arthralgen Diffunasal Mefanamic Suprofen  Arthritis Strength Bufferin Dihydrocodeine Mepro Compound Suprol  Arthropan liquid Dopirydamole Methcarbomol with Aspirin Synalgos  ASA tablets/Enseals Disalcid Micrainin Tagament  Ascriptin Doan's Midol Talwin  Ascriptin A/D Dolene Mobidin Tanderil  Ascriptin Extra Strength Dolobid Moblgesic Ticlid  Ascriptin with Codeine Doloprin or Doloprin with Codeine Momentum Tolectin  Asperbuf Duoprin Mono-gesic Trendar  Aspergum Duradyne Motrin or Motrin IB Triminicin  Aspirin plain, buffered or enteric coated Durasal Myochrisine Trigesic  Aspirin Suppositories Easprin Nalfon Trillsate  Aspirin with Codeine Ecotrin Regular or Extra Strength Naprosyn Uracel  Atromid-S Efficin Naproxen Ursinus  Auranofin Capsules Elmiron Neocylate Vanquish  Axotal Emagrin Norgesic Verin  Azathioprine Empirin or Empirin with Codeine Normiflo Vitamin E  Azolid Emprazil Nuprin Voltaren  Bayer Aspirin plain, buffered or children's or timed BC Tablets or powders Encaprin Orgaran Warfarin Sodium  Buff-a-Comp Enoxaparin Orudis Zorpin  Buff-a-Comp with Codeine Equegesic Os-Cal-Gesic   Buffaprin Excedrin plain, buffered or Extra Strength Oxalid   Bufferin Arthritis Strength Feldene Oxphenbutazone   Bufferin plain or Extra Strength Feldene Capsules Oxycodone with Aspirin   Bufferin with Codeine Fenoprofen Fenoprofen Pabalate or Pabalate-SF   Buffets II Flogesic Panagesic   Buffinol plain or Extra Strength Florinal or Florinal with Codeine  Panwarfarin   Buf-Tabs Flurbiprofen Penicillamine   Butalbital Compound Four-way cold tablets Penicillin   Butazolidin Fragmin Pepto-Bismol   Carbenicillin Geminisyn Percodan   Carna Arthritis Reliever Geopen Persantine   Carprofen Gold's salt Persistin   Chloramphenicol Goody's Phenylbutazone   Chloromycetin Haltrain Piroxlcam   Clmetidine heparin Plaquenil   Cllnoril Hyco-pap Ponstel   Clofibrate Hydroxy chloroquine Propoxyphen         Before stopping any of these medications, be sure to consult the physician who ordered them.  Some, such as Coumadin (Warfarin) are ordered to prevent or treat serious conditions such as "deep thrombosis", "pumonary embolisms", and other heart problems.  The amount of time that you may need off of the medication may also vary with the medication and the reason for which you were taking it.  If you are taking any of these medications, please make sure you notify your pain physician before you undergo any procedures.            Medications discontinued today:  Medications Discontinued During This Encounter  Medication Reason  . oxyCODONE (OXY IR/ROXICODONE) 5 MG immediate release tablet Reorder  . oxyCODONE (OXY  IR/ROXICODONE) 5 MG immediate release tablet Reorder  . methocarbamol (ROBAXIN) 500 MG tablet Error  . phentermine 30 MG capsule Error  . gabapentin (NEURONTIN) 600 MG tablet Reorder  . docusate sodium (COLACE) 100 MG capsule Reorder  . docusate sodium (COLACE) 100 MG capsule Reorder  . Lubiprostone (AMITIZA PO) Duplicate   Medications administered today:  Adam Petty had no medications administered during this visit.  Primary Care Physician: No primary care provider on file. Location: [facility] Note by: Shamarion Coots A. Laban Emperor, M.D, DABA, DABAPM, DABPM, DABIPP, FIPP

## 2014-12-22 NOTE — Patient Instructions (Addendum)
Facet Joint Block The facet joints connect the bones of the spine (vertebrae). They make it possible for you to bend, twist, and make other movements with your spine. They also prevent you from overbending, overtwisting, and making other excessive movements.  A facet joint block is a procedure where a numbing medicine (anesthetic) is injected into a facet joint. Often, a type of anti-inflammatory medicine called a steroid is also injected. A facet joint block may be done for two reasons:  1. Diagnosis. A facet joint block may be done as a test to see whether neck or back pain is caused by a worn-down or infected facet joint. If the pain gets better after a facet joint block, it means the pain is probably coming from the facet joint. If the pain does not get better, it means the pain is probably not coming from the facet joint.  2. Therapy. A facet joint block may be done to relieve neck or back pain caused by a facet joint. A facet joint block is only done as a therapy if the pain does not improve with medicine, exercise programs, physical therapy, and other forms of pain management. LET [facility] CARE PROVIDER KNOW ABOUT:   Any allergies you have.   All medicines you are taking, including vitamins, herbs, eyedrops, and over-the-counter medicines and creams.   Previous problems you or members of your family have had with the use of anesthetics.   Any blood disorders you have had.   Other health problems you have. RISKS AND COMPLICATIONS Generally, having a facet joint block is safe. However, as with any procedure, complications can occur. Possible complications associated with having a facet joint block include:   Bleeding.   Injury to a nerve near the injection site.   Pain at the injection site.   Weakness or numbness in areas controlled by nerves near the injection site.   Infection.   Temporary fluid retention.   Allergic reaction to anesthetics or medicines used  during the procedure. BEFORE THE PROCEDURE   Follow your health care provider's instructions if you are taking dietary supplements or medicines. You may need to stop taking them or reduce your dosage.   Do not take any new dietary supplements or medicines without asking your health care provider first.   Follow your health care provider's instructions about eating and drinking before the procedure. You may need to stop eating and drinking several hours before the procedure.   Arrange to have an adult drive you home after the procedure. PROCEDURE 1. You may need to remove your clothing and dress in an open-back gown so that your health care provider can access your spine.  2. The procedure will be done while you are lying on an X-ray table. Most of the time you will be asked to lie on your stomach, but you may be asked to lie in a different position if an injection will be made in your neck.  3. Special machines will be used to monitor your oxygen levels, heart rate, and blood pressure.  4. If an injection will be made in your neck, an intravenous (IV) tube will be inserted into one of your veins. Fluids and medicine will flow directly into your body through the IV tube.  5. The area over the facet joint where the injection will be made will be cleaned with an antiseptic soap. The surrounding skin will be covered with sterile drapes.  6. An anesthetic will be applied to your skin  to make the injection area numb. You may feel a temporary stinging or burning sensation.  7. A video X-ray machine will be used to locate the joint. A contrast dye may be injected into the facet joint area to help with locating the joint.  8. When the joint is located, an anesthetic medicine will be injected into the joint through the needle.  9. Your health care provider will ask you whether you feel pain relief. If you do feel relief, a steroid may be injected to provide pain relief for a longer period of  time. If you do not feel relief or feel only partial relief, additional injections of an anesthetic may be made in other facet joints.  10. The needle will be removed, the skin will be cleansed, and bandages will be applied.  AFTER THE PROCEDURE   You will be observed for 15-30 minutes before being allowed to go home. Do not drive. Have an adult drive you or take a taxi or public transportation instead.   If you feel pain relief, the pain will return in several hours or days when the anesthetic wears off.   You may feel pain relief 2-14 days after the procedure. The amount of time this relief lasts varies from person to person.   It is normal to feel some tenderness over the injected area(s) for 2 days following the procedure.   If you have diabetes, you may have a temporary increase in blood sugar.   This information is not intended to replace advice given to you by your health care provider. Make sure you discuss any questions you have with your health care provider.   Document Released: 05/25/2006 Document Revised: 01/24/2014 Document Reviewed: 10/24/2011 Elsevier Interactive Patient Education 2016 [employer] GENERAL RISKS AND COMPLICATIONS  What are the risk, side effects and possible complications? Generally speaking, most procedures are safe.  However, with any procedure there are risks, side effects, and the possibility of complications.  The risks and complications are dependent upon the sites that are lesioned, or the type of nerve block to be performed.  The closer the procedure is to the spine, the more serious the risks are.  Great care is taken when placing the radio frequency needles, block needles or lesioning probes, but sometimes complications can occur. 1. Infection: Any time there is an injection through the skin, there is a risk of infection.  This is why sterile conditions are used for these blocks.  There are four possible types of infection. 1. Localized skin  infection. 2. Central Nervous System Infection-This can be in the form of Meningitis, which can be deadly. 3. Epidural Infections-This can be in the form of an epidural abscess, which can cause pressure inside of the spine, causing compression of the spinal cord with subsequent paralysis. This would require an emergency surgery to decompress, and there are no guarantees that the patient would recover from the paralysis. 4. Discitis-This is an infection of the intervertebral discs.  It occurs in about 1% of discography procedures.  It is difficult to treat and it may lead to surgery.        2. Pain: the needles have to go through skin and soft tissues, will cause soreness.       3. Damage to internal structures:  The nerves to be lesioned may be near blood vessels or    other nerves which can be potentially damaged.       4. Bleeding: Bleeding is more common if  the patient is taking blood thinners such as  aspirin, Coumadin, Ticiid, Plavix, etc., or if he/she have some genetic predisposition  such as hemophilia. Bleeding into the spinal canal can cause compression of the spinal  cord with subsequent paralysis.  This would require an emergency surgery to  decompress and there are no guarantees that the patient would recover from the  paralysis.       5. Pneumothorax:  Puncturing of a lung is a possibility, every time a needle is introduced in  the area of the chest or upper back.  Pneumothorax refers to free air around the  collapsed lung(s), inside of the thoracic cavity (chest cavity).  Another two possible  complications related to a similar event would include: Hemothorax and Chylothorax.   These are variations of the Pneumothorax, where instead of air around the collapsed  lung(s), you may have blood or chyle, respectively.       6. Spinal headaches: They may occur with any procedures in the area of the spine.       7. Persistent CSF (Cerebro-Spinal Fluid) leakage: This is a rare problem, but may occur   with prolonged intrathecal or epidural catheters either due to the formation of a fistulous  track or a dural tear.       8. Nerve damage: By working so close to the spinal cord, there is always a possibility of  nerve damage, which could be as serious as a permanent spinal cord injury with  paralysis.       9. Death:  Although rare, severe deadly allergic reactions known as "Anaphylactic  reaction" can occur to any of the medications used.      10. Worsening of the symptoms:  We can always make thing worse.  What are the chances of something like this happening? Chances of any of this occuring are extremely low.  By statistics, you have more of a chance of getting killed in a motor vehicle accident: while driving to the hospital than any of the above occurring .  Nevertheless, you should be aware that they are possibilities.  In general, it is similar to taking a shower.  Everybody knows that you can slip, hit your head and get killed.  Does that mean that you should not shower again?  Nevertheless always keep in mind that statistics do not mean anything if you happen to be on the wrong side of them.  Even if a procedure has a 1 (one) in a 1,000,000 (million) chance of going wrong, it you happen to be that one..Also, keep in mind that by statistics, you have more of a chance of having something go wrong when taking medications.  Who should not have this procedure? If you are on a blood thinning medication (e.g. Coumadin, Plavix, see list of "Blood Thinners"), or if you have an active infection going on, you should not have the procedure.  If you are taking any blood thinners, please inform your physician.  How should I prepare for this procedure?  Do not eat or drink anything at least six hours prior to the procedure.  Bring a driver with you .  It cannot be a taxi.  Come accompanied by an adult that can drive you back, and that is strong enough to help you if your legs get weak or numb from the  local anesthetic.  Take all of your medicines the morning of the procedure with just enough water to swallow them.  If you have  diabetes, make sure that you are scheduled to have your procedure done first thing in the morning, whenever possible.  If you have diabetes, take only half of your insulin dose and notify our nurse that you have done so as soon as you arrive at the clinic.  If you are diabetic, but only take blood sugar pills (oral hypoglycemic), then do not take them on the morning of your procedure.  You may take them after you have had the procedure.  Do not take aspirin or any aspirin-containing medications, at least eleven (11) days prior to the procedure.  They may prolong bleeding.  Wear loose fitting clothing that may be easy to take off and that you would not mind if it got stained with Betadine or blood.  Do not wear any jewelry or perfume  Remove any nail coloring.  It will interfere with some of our monitoring equipment.  NOTE: Remember that this is not meant to be interpreted as a complete list of all possible complications.  Unforeseen problems may occur.  BLOOD THINNERS The following drugs contain aspirin or other products, which can cause increased bleeding during surgery and should not be taken for 2 weeks prior to and 1 week after surgery.  If you should need take something for relief of minor pain, you may take acetaminophen which is found in Tylenol,m Datril, Anacin-3 and Panadol. It is not blood thinner. The products listed below are.  Do not take any of the products listed below in addition to any listed on your instruction sheet.  A.P.C or A.P.C with Codeine Codeine Phosphate Capsules #3 Ibuprofen Ridaura  ABC compound Congesprin Imuran rimadil  Advil Cope Indocin Robaxisal  Alka-Seltzer Effervescent Pain Reliever and Antacid Coricidin or Coricidin-D  Indomethacin Rufen  Alka-Seltzer plus Cold Medicine Cosprin Ketoprofen S-A-C Tablets  Anacin Analgesic  Tablets or Capsules Coumadin Korlgesic Salflex  Anacin Extra Strength Analgesic tablets or capsules CP-2 Tablets Lanoril Salicylate  Anaprox Cuprimine Capsules Levenox Salocol  Anexsia-D Dalteparin Magan Salsalate  Anodynos Darvon compound Magnesium Salicylate Sine-off  Ansaid Dasin Capsules Magsal Sodium Salicylate  Anturane Depen Capsules Marnal Soma  APF Arthritis pain formula Dewitt's Pills Measurin Stanback  Argesic Dia-Gesic Meclofenamic Sulfinpyrazone  Arthritis Bayer Timed Release Aspirin Diclofenac Meclomen Sulindac  Arthritis pain formula Anacin Dicumarol Medipren Supac  Analgesic (Safety coated) Arthralgen Diffunasal Mefanamic Suprofen  Arthritis Strength Bufferin Dihydrocodeine Mepro Compound Suprol  Arthropan liquid Dopirydamole Methcarbomol with Aspirin Synalgos  ASA tablets/Enseals Disalcid Micrainin Tagament  Ascriptin Doan's Midol Talwin  Ascriptin A/D Dolene Mobidin Tanderil  Ascriptin Extra Strength Dolobid Moblgesic Ticlid  Ascriptin with Codeine Doloprin or Doloprin with Codeine Momentum Tolectin  Asperbuf Duoprin Mono-gesic Trendar  Aspergum Duradyne Motrin or Motrin IB Triminicin  Aspirin plain, buffered or enteric coated Durasal Myochrisine Trigesic  Aspirin Suppositories Easprin Nalfon Trillsate  Aspirin with Codeine Ecotrin Regular or Extra Strength Naprosyn Uracel  Atromid-S Efficin Naproxen Ursinus  Auranofin Capsules Elmiron Neocylate Vanquish  Axotal Emagrin Norgesic Verin  Azathioprine Empirin or Empirin with Codeine Normiflo Vitamin E  Azolid Emprazil Nuprin Voltaren  Bayer Aspirin plain, buffered or children's or timed BC Tablets or powders Encaprin Orgaran Warfarin Sodium  Buff-a-Comp Enoxaparin Orudis Zorpin  Buff-a-Comp with Codeine Equegesic Os-Cal-Gesic   Buffaprin Excedrin plain, buffered or Extra Strength Oxalid   Bufferin Arthritis Strength Feldene Oxphenbutazone   Bufferin plain or Extra Strength Feldene Capsules Oxycodone with Aspirin    Bufferin with Codeine Fenoprofen Fenoprofen Pabalate or Pabalate-SF   Buffets II Flogesic Panagesic  Buffinol plain or Extra Strength Florinal or Florinal with Codeine Panwarfarin   Buf-Tabs Flurbiprofen Penicillamine   Butalbital Compound Four-way cold tablets Penicillin   Butazolidin Fragmin Pepto-Bismol   Carbenicillin Geminisyn Percodan   Carna Arthritis Reliever Geopen Persantine   Carprofen Gold's salt Persistin   Chloramphenicol Goody's Phenylbutazone   Chloromycetin Haltrain Piroxlcam   Clmetidine heparin Plaquenil   Cllnoril Hyco-pap Ponstel   Clofibrate Hydroxy chloroquine Propoxyphen         Before stopping any of these medications, be sure to consult the physician who ordered them.  Some, such as Coumadin (Warfarin) are ordered to prevent or treat serious conditions such as "deep thrombosis", "pumonary embolisms", and other heart problems.  The amount of time that you may need off of the medication may also vary with the medication and the reason for which you were taking it.  If you are taking any of these medications, please make sure you notify your pain physician before you undergo any procedures.          

## 2014-12-27 LAB — TOXASSURE SELECT 13 (MW), URINE: PDF: 0

## 2015-01-06 ENCOUNTER — Other Ambulatory Visit: Payer: Self-pay | Admitting: Family Medicine

## 2015-01-06 DIAGNOSIS — K439 Ventral hernia without obstruction or gangrene: Secondary | ICD-10-CM

## 2015-01-08 ENCOUNTER — Ambulatory Visit: Admission: RE | Admit: 2015-01-08 | Payer: BLUE CROSS/BLUE SHIELD | Source: Ambulatory Visit

## 2015-01-20 ENCOUNTER — Ambulatory Visit
Admission: RE | Admit: 2015-01-20 | Discharge: 2015-01-20 | Disposition: A | Discharge status: Home / Self Care | Payer: BLUE CROSS/BLUE SHIELD | Source: Ambulatory Visit | Attending: Family Medicine | Admitting: Family Medicine

## 2015-01-20 DIAGNOSIS — R1900 Intra-abdominal and pelvic swelling, mass and lump, unspecified site: Secondary | ICD-10-CM | POA: Insufficient documentation

## 2015-01-20 DIAGNOSIS — K439 Ventral hernia without obstruction or gangrene: Secondary | ICD-10-CM

## 2015-02-02 ENCOUNTER — Telehealth: Payer: Self-pay | Admitting: Pain Medicine

## 2015-02-02 NOTE — Telephone Encounter (Signed)
[employer] calling to see if Dr. Laban EmperorNaveira received a letter from them regarding a new treatment plan ? And if that has been done Call Dorene SorrowJerry case mgr  At [phone] or fax completed forms to [phone]

## 2015-02-02 NOTE — Telephone Encounter (Signed)
Attempted to call patient to tell him that Dr. Shireen QuanNaviera has accepted him as a patient. No answer, unable to leave a message.

## 2015-02-03 NOTE — Telephone Encounter (Signed)
This patient is already a patient of Dr. Laban Emperor -See Original Note-

## 2015-02-24 ENCOUNTER — Encounter: Payer: Self-pay | Admitting: Pain Medicine

## 2015-02-24 ENCOUNTER — Ambulatory Visit: Payer: Worker's Compensation | Attending: Pain Medicine | Admitting: Pain Medicine

## 2015-02-24 VITALS — BP 130/79 | HR 82 | Temp 98.0°F | Resp 16 | Ht 66.0 in | Wt 245.0 lb

## 2015-02-24 DIAGNOSIS — M545 Low back pain: Secondary | ICD-10-CM | POA: Diagnosis not present

## 2015-02-24 DIAGNOSIS — G545 Neuralgic amyotrophy: Secondary | ICD-10-CM | POA: Insufficient documentation

## 2015-02-24 DIAGNOSIS — F1190 Opioid use, unspecified, uncomplicated: Secondary | ICD-10-CM | POA: Insufficient documentation

## 2015-02-24 DIAGNOSIS — E119 Type 2 diabetes mellitus without complications: Secondary | ICD-10-CM | POA: Diagnosis not present

## 2015-02-24 DIAGNOSIS — M47816 Spondylosis without myelopathy or radiculopathy, lumbar region: Secondary | ICD-10-CM | POA: Diagnosis not present

## 2015-02-24 DIAGNOSIS — M47896 Other spondylosis, lumbar region: Secondary | ICD-10-CM | POA: Diagnosis not present

## 2015-02-24 DIAGNOSIS — M4806 Spinal stenosis, lumbar region: Secondary | ICD-10-CM | POA: Insufficient documentation

## 2015-02-24 DIAGNOSIS — M25561 Pain in right knee: Secondary | ICD-10-CM | POA: Diagnosis not present

## 2015-02-24 DIAGNOSIS — G8929 Other chronic pain: Secondary | ICD-10-CM | POA: Diagnosis not present

## 2015-02-24 DIAGNOSIS — M549 Dorsalgia, unspecified: Secondary | ICD-10-CM | POA: Diagnosis present

## 2015-02-24 MED ORDER — ROPIVACAINE HCL 2 MG/ML IJ SOLN
INTRAMUSCULAR | Status: AC
  Administered 2015-02-24: 10:00:00
  Filled 2015-02-24: qty 10

## 2015-02-24 MED ORDER — FENTANYL CITRATE (PF) 100 MCG/2ML IJ SOLN: 100.0000 ug | INTRAMUSCULAR | Status: DC

## 2015-02-24 MED ORDER — MIDAZOLAM HCL 5 MG/5ML IJ SOLN: 5.0000 mg | INTRAMUSCULAR | Status: DC

## 2015-02-24 MED ORDER — MIDAZOLAM HCL 5 MG/5ML IJ SOLN
INTRAMUSCULAR | Status: AC
  Administered 2015-02-24: 2 mg via INTRAVENOUS
  Filled 2015-02-24: qty 5

## 2015-02-24 MED ORDER — LIDOCAINE HCL (PF) 1 % IJ SOLN: 10.0000 mL | Freq: Once | INTRAMUSCULAR | Status: DC

## 2015-02-24 MED ORDER — TRIAMCINOLONE ACETONIDE 40 MG/ML IJ SUSP: 40.0000 mg | Freq: Once | INTRAMUSCULAR | Status: DC

## 2015-02-24 MED ORDER — TRIAMCINOLONE ACETONIDE 40 MG/ML IJ SUSP
INTRAMUSCULAR | Status: AC
  Administered 2015-02-24: 10:00:00
  Filled 2015-02-24: qty 1

## 2015-02-24 MED ORDER — ROPIVACAINE HCL 2 MG/ML IJ SOLN: 9.0000 mL | Freq: Once | INTRAMUSCULAR | Status: DC

## 2015-02-24 MED ORDER — LACTATED RINGERS IV SOLN: 1000.0000 mL | INTRAVENOUS | Status: AC

## 2015-02-24 MED ORDER — FENTANYL CITRATE (PF) 100 MCG/2ML IJ SOLN
INTRAMUSCULAR | Status: AC
  Administered 2015-02-24: 50 ug via INTRAVENOUS
  Filled 2015-02-24: qty 2

## 2015-02-24 NOTE — Progress Notes (Signed)
Patient's Name: Adam Petty. MRN: [id number] DOB: [birth year] DOS: 02/24/2015  Primary Reason(s) for Visit: Interventional Pain Management Treatment. CC: Back Pain   Pre-Procedure Assessment:  Mr. Demeyer is a 55 y.o. year old, male patient, seen today for interventional treatment. 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); Discogenic syndrome, lumbar (WC injury); Osteoarthritis of spine with radiculopathy, lumbar region (WC injury); Lumbar facet syndrome (WC injury) (Right); Lumbosacral radiculopathy (WC injury); Pain in right knee (WC injury); Obesity, Class III, BMI 40-49.9 (morbid obesity) ([place]); Uncomplicated opioid dependence ([place]); Therapeutic opioid-induced constipation (OIC); Dyslipidemia; Microalbuminuria; Adiposity; Lumbar canal stenosis; Type 2 diabetes mellitus ([place]); Chronic pain (WC injury); Long term prescription opiate use; Encounter for chronic pain management; Neurogenic pain; Neuropathic pain; Musculoskeletal pain; and Lumbar spondylosis on his problem list.. His primarily concern today is the Back Pain   Today's Initial Pain Score: 5/10. Reported level of pain is incompatible with clinical obrservations. This may be secondary to a possible lack of understanding on how the pain scale works. Pain Type: Chronic pain Pain Location: Back Pain Orientation: Lower Pain Descriptors / Indicators: Aching, Burning, Sharp, Stabbing, Shooting, Dull Pain Frequency: Constant  Post-procedure Pain Score: 6   Date of Last Visit: 12/22/14 Service Provided on Last Visit: Med Refill  Verification of the correct person, correct site (including marking of site), and correct procedure were performed and confirmed by the patient.  Today's Vitals   02/24/15 1020 02/24/15 1026  02/24/15 1035 02/24/15 1043  BP: 140/83 142/82 129/82 130/79  Pulse: 80 80 81 82  Temp:      Resp: '16 16 18 16  '$ Height:      Weight:      SpO2: 95% 98% 100% 99%  PainSc: Asleep 6   6   PainLoc:      Calculated BMI: Body mass index is 39.56 kg/(m^2). Allergies: He has No Known Allergies.. Primary Diagnosis: Facet syndrome, lumbar [M54.5]  Procedure:  Type: Diagnostic Medial Branch Facet Block Region: Lumbar Level: L2, L3, L4, L5, & S1 Medial Branch Level(s) Laterality: Right  Indications: 1. Lumbar facet syndrome (WC injury) (Right)   2. Chronic low back pain (WC injury)   3. Lumbar spondylosis, unspecified spinal osteoarthritis     In addition, Mr. 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  Consent: Secured. Under the influence of no sedatives a written informed consent was obtained, after having provided information on the risks and possible complications. To fulfill our ethical and legal obligations, as recommended by the American Medical Association's Code of Ethics, we have provided information to the patient about our clinical impression; the nature and purpose of the treatment or procedure; the risks, benefits, and possible complications of the intervention; alternatives; the risk(s) and benefit(s) of the alternative treatment(s) or procedure(s); and the risk(s) and benefit(s) of doing nothing. The patient was provided information about the risks and possible complications associated with the  procedure. In  the case of spinal procedures these may include, but are not limited to, failure to achieve desired goals, infection, bleeding, organ or nerve damage, allergic reactions, paralysis, and death. In addition, the patient was informed that Medicine is not an exact science; therefore, there is also the possibility of unforeseen risks and possible complications that may result in a catastrophic outcome. The patient indicated having understood very clearly. We have given the patient no guarantees and we have made no promises. Enough time was given to the patient to ask questions, all of which were answered to the patient's satisfaction.  Pre-Procedure Preparation: Safety Precautions: Allergies reviewed. Appropriate site, procedure, and patient were confirmed by following the Joint Commission's Universal Protocol ([id number].01.01.01), in the form of a "Time Out". The patient was asked to confirm marked site and procedure, before commencing. The patient was asked about blood thinners, or active infections, both of which were denied. Patient was assessed for positional comfort and all pressure points were checked before starting procedure. Monitoring:  As per clinic protocol. Infection Control Precautions: Sterile technique used. Standard Universal Precautions were taken as recommended by the Department of [facility] for Disease Control and Prevention (CDC). Standard pre-surgical skin prep was conducted. Respiratory hygiene and cough etiquette was practiced. Hand hygiene observed. Safe injection practices and needle disposal techniques followed. SDV (single dose vial) medications used. Medications properly checked for expiration dates and contaminants. Personal protective equipment (PPE) used: Sterile double glove technique. Radiation resistant gloves. Sterile surgical gloves.  Anesthesia, Analgesia, Anxiolysis: Type: Moderate (Conscious) Sedation & Local Anesthesia Local Anesthetic: Lidocaine  1% Route: Intravenous (IV) IV Access: Secured Sedation: Meaningful verbal contact was maintained at all times during the procedure  Indication(s): Analgesia & Anxiolysis  Description of Procedure Process:  Time-out: "Time-out" completed before starting procedure, as per protocol. Position: Prone Target Area: For Lumbar Facet blocks, the target is the groove formed by the junction of the transverse process and superior articular process. For the L5 dorsal ramus, the target is the notch between superior articular process and sacral ala. For the S1 dorsal ramus, the target is the superior and lateral edge of the posterior S1 Sacral foramen. Approach: Paramedial approach. Area Prepped: Entire Posterior Lumbosacral Region Prepping solution: ChloraPrep (2% chlorhexidine gluconate and 70% isopropyl alcohol) Safety Precautions: Aspiration looking for blood return was conducted prior to all injections. At no point did we inject any substances, as a needle was being advanced. No attempts were made at seeking any paresthesias. Safe injection practices and needle disposal techniques used. Medications properly checked for expiration dates. SDV (single dose vial) medications used.   Description of the Procedure: Protocol guidelines were followed. The patient was placed in position over the fluoroscopy table. The target area was identified and the area prepped in the usual manner. Skin desensitized using vapocoolant spray. Skin & deeper tissues infiltrated with local anesthetic. Appropriate amount of time allowed to pass for local anesthetics to take effect. The procedure needle was introduced through the skin, ipsilateral to the reported pain, and advanced to the target area. Employing the "Medial Branch Technique", the needles were advanced to the angle made by the superior and medial portion of the transverse process, and the lateral and inferior portion of the superior articulating process of the targeted  vertebral bodies. This area is known as "Burton's Eye" or the "Eye of the [place] Dog". A procedure needle was introduced through the skin, and this time advanced to the angle made by the superior and medial border of  the sacral ala, and the lateral border of the S1 vertebral body. This last needle was later repositioned at the superior and lateral border of the posterior S1 foramen. Negative aspiration confirmed. Solution injected in intermittent fashion, asking for systemic symptoms every 0.5cc of injectate. The needles were then removed and the area cleansed, making sure to leave some of the prepping solution back to take advantage of its long term bactericidal properties. EBL: None Materials & Medications Used:  Needle(s) Used: 22g - 5" Spinal Needle(s) Medications Administered today: We administered ropivacaine (PF) 2 mg/ml (0.2%), fentaNYL, triamcinolone acetonide, and midazolam.Please see chart orders for dosing details.  Imaging Guidance:  Type of Imaging Technique: Fluoroscopy Guidance (Spinal) Indication(s): Assistance in needle guidance and placement for procedures requiring needle placement in or near specific anatomical locations not easily accessible without such assistance. Exposure Time: Please see nurses notes. Contrast: None required. Fluoroscopic Guidance: I was personally present in the fluoroscopy suite, where the patient was placed in position for the procedure, over the fluoroscopy-compatible table. Fluoroscopy was manipulated, using "Tunnel Vision Technique", to obtain the best possible view of the target area, on the affected side. Parallax error was corrected before commencing the procedure. A "direction-depth-direction" technique was used to introduce the needle under continuous pulsed fluoroscopic guidance. Once the target was reached, antero-posterior, oblique, and lateral fluoroscopic projection views were taken to confirm needle placement in all planes. Permanently recorded  images stored by scanning into EMR. Interpretation: Intraoperative imaging interpretation by performing Physician. Adequate needle placement confirmed. Adequate needle placement confirmed in AP, lateral, & Oblique Views. No contrast injected.  Antibiotics:  Type:  Antibiotics Given (last 72 hours)    None      Indication(s): No indications identified.  Post-operative Assessment:  Complications: No immediate post-treatment complications were observed. Disposition: Return to clinic for follow-up evaluation. The patient tolerated the entire procedure well. A repeat set of vitals were taken after the procedure and the patient was kept under observation following institutional policy, for this procedure. Post-procedural neurological assessment was performed, showing return to baseline, prior to discharge. The patient was discharged home, once institutional criteria were met. The patient was provided with post-procedure discharge instructions, including a section on how to identify potential problems. Should any problems arise concerning this procedure, the patient was given instructions to immediately contact us, at any time, without hesitation. In any case, we plan to contact the patient by telephone for a follow-up status report regarding this interventional procedure. Comments:  No additional relevant information.  Primary Care Physician: No PCP Per Patient Location: [facility] Note by: Sujata Maines A. Dossie Arbour, M.D, DABA, DABAPM, DABPM, DABIPP, FIPP   Illustration of the posterior view of the lumbar spine and the posterior neural structures. Laminae of L2 through S1 are labeled. DPRL5, dorsal primary ramus of L5; DPRS1, dorsal primary ramus of S1; DPR3, dorsal primary ramus of L3; FJ, facet (zygapophyseal) joint L3-L4; I, inferior articular process of L4; LB1, lateral branch of dorsal primary ramus of L1; IAB, inferior articular branches from L3 medial branch (supplies  L4-L5 facet joint); IBP, intermediate branch plexus; MB3, medial branch of dorsal primary ramus of L3; NR3, third lumbar nerve root; S, superior articular process of L5; SAB, superior articular branches from L4 (supplies L4-5 facet joint also); TP3, transverse process of L3.  Disclaimer:  Medicine is not an Chief Strategy Officer. The only guarantee in medicine is that nothing is guaranteed. It is important to note that the decision to proceed with this  intervention was based on the information collected from the patient. The Data and conclusions were drawn from the patient's questionnaire, the interview, and the physical examination. Because the information was provided in large part by the patient, it cannot be guaranteed that it has not been purposely or unconsciously manipulated. Every effort has been made to obtain as much relevant data as possible for this evaluation. It is important to note that the conclusions that lead to this procedure are derived in large part from the available data. Always take into account that the treatment will also be dependent on availability of resources and existing treatment guidelines, considered by other Pain Management Practitioners as being common knowledge and practice, at the time of the intervention. For Medico-Legal purposes, it is also important to point out that variation in procedural techniques and pharmacological choices are the acceptable norm. The indications, contraindications, technique, and results of the above procedure should only be interpreted and judged by a Board-Certified Interventional Pain Specialist with extensive familiarity and expertise in the same exact procedure and technique. Attempts at providing opinions without similar or greater experience and expertise than that of the treating physician will be considered as inappropriate and unethical, and shall result in a formal complaint to the state medical board and applicable specialty societies.

## 2015-02-24 NOTE — Progress Notes (Signed)
Safety precautions to be maintained throughout the outpatient stay will include: orient to surroundings, keep bed in low position, maintain call bell within reach at all times, provide assistance with transfer out of bed and ambulation.  

## 2015-02-24 NOTE — Patient Instructions (Signed)
Pain Management Discharge Instructions  General Discharge Instructions :  If you need to reach your doctor call: Monday-Friday 8:00 am - 4:00 pm at [phone] or toll free [phone].  After clinic hours [phone] to have operator reach doctor.  Bring all of your medication bottles to all your appointments in the pain clinic.  To cancel or reschedule your appointment with Pain Management please remember to call 24 hours in advance to avoid a fee.  Refer to the educational materials which you have been given on: General Risks, I had my Procedure. Discharge Instructions, Post Sedation.  Post Procedure Instructions:  The drugs you were given will stay in your system until tomorrow, so for the next 24 hours you should not drive, make any legal decisions or drink any alcoholic beverages.  You may eat anything you prefer, but it is better to start with liquids then soups and crackers, and gradually work up to solid foods.  Please notify your doctor immediately if you have any unusual bleeding, trouble breathing or pain that is not related to your normal pain.  Depending on the type of procedure that was done, some parts of your body may feel week and/or numb.  This usually clears up by tonight or the next day.  Walk with the use of an assistive device or accompanied by an adult for the 24 hours.  You may use ice on the affected area for the first 24 hours.  Put ice in a Ziploc bag and cover with a towel and place against area 15 minutes on 15 minutes off.  You may switch to heat after 24 hours.Facet Joint Block The facet joints connect the bones of the spine (vertebrae). They make it possible for you to bend, twist, and make other movements with your spine. They also prevent you from overbending, overtwisting, and making other excessive movements.  A facet joint block is a procedure where a numbing medicine (anesthetic) is injected into a facet joint. Often, a type of anti-inflammatory  medicine called a steroid is also injected. A facet joint block may be done for two reasons:   Diagnosis. A facet joint block may be done as a test to see whether neck or back pain is caused by a worn-down or infected facet joint. If the pain gets better after a facet joint block, it means the pain is probably coming from the facet joint. If the pain does not get better, it means the pain is probably not coming from the facet joint.   Therapy. A facet joint block may be done to relieve neck or back pain caused by a facet joint. A facet joint block is only done as a therapy if the pain does not improve with medicine, exercise programs, physical therapy, and other forms of pain management. LET YOUR HEALTH CARE PROVIDER KNOW ABOUT:   Any allergies you have.   All medicines you are taking, including vitamins, herbs, eyedrops, and over-the-counter medicines and creams.   Previous problems you or members of your family have had with the use of anesthetics.   Any blood disorders you have had.   Other health problems you have. RISKS AND COMPLICATIONS Generally, having a facet joint block is safe. However, as with any procedure, complications can occur. Possible complications associated with having a facet joint block include:   Bleeding.   Injury to a nerve near the injection site.   Pain at the injection site.   Weakness or numbness in areas controlled by nerves near   the injection site.   Infection.   Temporary fluid retention.   Allergic reaction to anesthetics or medicines used during the procedure. BEFORE THE PROCEDURE   Follow your health care provider's instructions if you are taking dietary supplements or medicines. You may need to stop taking them or reduce your dosage.   Do not take any new dietary supplements or medicines without asking your health care provider first.   Follow your health care provider's instructions about eating and drinking before the  procedure. You may need to stop eating and drinking several hours before the procedure.   Arrange to have an adult drive you home after the procedure. PROCEDURE  You may need to remove your clothing and dress in an open-back gown so that your health care provider can access your spine.   The procedure will be done while you are lying on an X-ray table. Most of the time you will be asked to lie on your stomach, but you may be asked to lie in a different position if an injection will be made in your neck.   Special machines will be used to monitor your oxygen levels, heart rate, and blood pressure.   If an injection will be made in your neck, an intravenous (IV) tube will be inserted into one of your veins. Fluids and medicine will flow directly into your body through the IV tube.   The area over the facet joint where the injection will be made will be cleaned with an antiseptic soap. The surrounding skin will be covered with sterile drapes.   An anesthetic will be applied to your skin to make the injection area numb. You may feel a temporary stinging or burning sensation.   A video X-ray machine will be used to locate the joint. A contrast dye may be injected into the facet joint area to help with locating the joint.   When the joint is located, an anesthetic medicine will be injected into the joint through the needle.   Your health care provider will ask you whether you feel pain relief. If you do feel relief, a steroid may be injected to provide pain relief for a longer period of time. If you do not feel relief or feel only partial relief, additional injections of an anesthetic may be made in other facet joints.   The needle will be removed, the skin will be cleansed, and bandages will be applied.  AFTER THE PROCEDURE   You will be observed for 15-30 minutes before being allowed to go home. Do not drive. Have an adult drive you or take a taxi or public transportation instead.    If you feel pain relief, the pain will return in several hours or days when the anesthetic wears off.   You may feel pain relief 2-14 days after the procedure. The amount of time this relief lasts varies from person to person.   It is normal to feel some tenderness over the injected area(s) for 2 days following the procedure.   If you have diabetes, you may have a temporary increase in blood sugar.   This information is not intended to replace advice given to you by your health care provider. Make sure you discuss any questions you have with your health care provider.   Document Released: 05/25/2006 Document Revised: 01/24/2014 Document Reviewed: 10/24/2011 Elsevier Interactive Patient Education 2016 [employer]  

## 2015-02-25 ENCOUNTER — Telehealth: Payer: Self-pay | Admitting: *Deleted

## 2015-02-25 NOTE — Telephone Encounter (Signed)
Left voicemail re; procedure on yesterday to call with any questions or concerns.

## 2015-03-12 ENCOUNTER — Encounter: Payer: Self-pay | Admitting: Pain Medicine

## 2015-03-12 ENCOUNTER — Ambulatory Visit: Payer: Worker's Compensation | Attending: Pain Medicine | Admitting: Pain Medicine

## 2015-03-12 ENCOUNTER — Ambulatory Visit: Payer: Self-pay | Admitting: Orthopedic Surgery

## 2015-03-12 VITALS — BP 140/80 | HR 97 | Temp 98.6°F | Resp 15 | Ht 66.0 in | Wt 240.0 lb

## 2015-03-12 DIAGNOSIS — M4806 Spinal stenosis, lumbar region: Secondary | ICD-10-CM | POA: Insufficient documentation

## 2015-03-12 DIAGNOSIS — E785 Hyperlipidemia, unspecified: Secondary | ICD-10-CM | POA: Diagnosis not present

## 2015-03-12 DIAGNOSIS — F1190 Opioid use, unspecified, uncomplicated: Secondary | ICD-10-CM

## 2015-03-12 DIAGNOSIS — M25511 Pain in right shoulder: Secondary | ICD-10-CM | POA: Diagnosis not present

## 2015-03-12 DIAGNOSIS — G8929 Other chronic pain: Secondary | ICD-10-CM | POA: Insufficient documentation

## 2015-03-12 DIAGNOSIS — Z79891 Long term (current) use of opiate analgesic: Secondary | ICD-10-CM

## 2015-03-12 DIAGNOSIS — M1711 Unilateral primary osteoarthritis, right knee: Secondary | ICD-10-CM

## 2015-03-12 DIAGNOSIS — K5903 Drug induced constipation: Secondary | ICD-10-CM | POA: Diagnosis not present

## 2015-03-12 DIAGNOSIS — M19011 Primary osteoarthritis, right shoulder: Secondary | ICD-10-CM

## 2015-03-12 DIAGNOSIS — K589 Irritable bowel syndrome without diarrhea: Secondary | ICD-10-CM | POA: Insufficient documentation

## 2015-03-12 DIAGNOSIS — M25569 Pain in unspecified knee: Secondary | ICD-10-CM | POA: Diagnosis not present

## 2015-03-12 DIAGNOSIS — Z5181 Encounter for therapeutic drug level monitoring: Secondary | ICD-10-CM

## 2015-03-12 DIAGNOSIS — N200 Calculus of kidney: Secondary | ICD-10-CM

## 2015-03-12 DIAGNOSIS — Z9889 Other specified postprocedural states: Secondary | ICD-10-CM | POA: Diagnosis not present

## 2015-03-12 DIAGNOSIS — M549 Dorsalgia, unspecified: Secondary | ICD-10-CM | POA: Diagnosis present

## 2015-03-12 DIAGNOSIS — E119 Type 2 diabetes mellitus without complications: Secondary | ICD-10-CM | POA: Diagnosis not present

## 2015-03-12 DIAGNOSIS — M545 Low back pain: Secondary | ICD-10-CM

## 2015-03-12 DIAGNOSIS — M47816 Spondylosis without myelopathy or radiculopathy, lumbar region: Secondary | ICD-10-CM

## 2015-03-12 DIAGNOSIS — M79604 Pain in right leg: Secondary | ICD-10-CM | POA: Diagnosis not present

## 2015-03-12 DIAGNOSIS — M25561 Pain in right knee: Secondary | ICD-10-CM | POA: Insufficient documentation

## 2015-03-12 DIAGNOSIS — M792 Neuralgia and neuritis, unspecified: Secondary | ICD-10-CM

## 2015-03-12 LAB — C-REACTIVE PROTEIN: CRP: 0.6 mg/dL (ref <1.0)

## 2015-03-12 LAB — COMPREHENSIVE METABOLIC PANEL
ALT: 55 U/L (ref 17–63)
AST: 41 U/L (ref 15–41)
Albumin: 4.2 g/dL (ref 3.5–5.0)
Alkaline Phosphatase: 89 U/L (ref 38–126)
Anion gap: 11 (ref 5–15)
BILIRUBIN TOTAL: 0.6 mg/dL (ref 0.3–1.2)
BUN: 15 mg/dL (ref 6–20)
CHLORIDE: 105 mmol/L (ref 101–111)
CO2: 24 mmol/L (ref 22–32)
Calcium: 10 mg/dL (ref 8.9–10.3)
Creatinine, Ser: 0.82 mg/dL (ref 0.61–1.24)
GFR calc Af Amer: >60 mL/min (ref >60)
Glucose, Bld: 137 mg/dL — ABNORMAL HIGH (ref 65–99)
POTASSIUM: 4.2 mmol/L (ref 3.5–5.1)
Sodium: 140 mmol/L (ref 135–145)
TOTAL PROTEIN: 7.7 g/dL (ref 6.5–8.1)

## 2015-03-12 LAB — MAGNESIUM: MAGNESIUM: 1.8 mg/dL (ref 1.7–2.4)

## 2015-03-12 LAB — SEDIMENTATION RATE: Sed Rate: 9 mm/hr (ref 0–20)

## 2015-03-12 MED ORDER — OXYCODONE HCL 5 MG PO TABS: 5.0000 mg | ORAL_TABLET | Freq: Four times a day (QID) | ORAL | Status: DC | PRN

## 2015-03-12 MED ORDER — GABAPENTIN 600 MG PO TABS: 600.0000 mg | ORAL_TABLET | Freq: Four times a day (QID) | ORAL | Status: DC

## 2015-03-12 NOTE — Progress Notes (Signed)
Patient's Name: Rodolphe Edmonston. MRN: [id number] DOB: [birth year] DOS: 03/12/2015  Primary Reason(s) for Visit: Post-Procedure evaluation and medication management. CC: Back Pain; Knee Pain; and Shoulder Injury   HPI  Mr. Rinkenberger is a 55 y.o. year old, male patient, who returns today as an established patient. 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); Discogenic syndrome, lumbar (WC injury); Osteoarthritis of spine with radiculopathy, lumbar region (WC injury); Lumbar facet syndrome (WC injury) (Right); Lumbosacral radiculopathy (WC injury); Pain in right knee (WC injury); Obesity, Class III, BMI 40-49.9 (morbid obesity) (HCC); Uncomplicated opioid dependence (HCC); Therapeutic opioid-induced constipation (OIC); Dyslipidemia; Microalbuminuria; Adiposity; Lumbar canal stenosis; Type 2 diabetes mellitus (HCC); Chronic pain (WC injury); Long term prescription opiate use; Encounter for chronic pain management; Neurogenic pain; Neuropathic pain; Musculoskeletal pain; Lumbar spondylosis; Chronic knee pain (Location of Secondary source of pain) (Right); Osteoarthritis of knee (Location of Secondary source of pain) (Right); Chronic shoulder pain (Location of Tertiary source of pain) (Right); Osteoarthritis of shoulder (Location of Tertiary source of pain) (Right); Chronic lower extremity pain (Right); and Recurrent nephrolithiasis on his problem list.. His primarily concern today is the Back Pain; Knee Pain; and Shoulder Injury   The patient returns to the clinics today for postprocedure evaluation after having had a diagnostic right-sided lumbar facet block under fluoroscopic guidance and IV sedation. The patient indicates that once  the local anesthetic started working, he got approximately 75% relief of the pain. This lasted approximately 4-6 hours, as expected by the duration of the ropivacaine.  In addition, today we took the opportunity to review his pharmacological management. Since he is going to have surgery of his right knee, we have provided him with a handout to give to the surgeon to assist in managing his chronic pain after the surgery.  Reported Pain Score: 5  Reported level is inconsistent with clinical obrservations. Pain Type: Chronic pain Pain Location: Back Pain Orientation: Lower Pain Descriptors / Indicators: Aching, Burning, Stabbing, Dull Pain Frequency: Constant  Date of Last Visit: 02/24/15 Service Provided on Last Visit: Procedure  Controlled Substance Pharmacotherapy Assessment  Analgesic: Oxycodone IR 5 mg 1 tablet by mouth every 6 hours when necessary for pain (20 mg/day) MME/day: 30 mg/day Pharmacokinetics: Onset of action (Liberation/Absorption): Within expected pharmacological parameters. (30 minutes) Time to Peak effect (Distribution): Timing and results are as within normal expected parameters. (2 hours) Duration of action (Metabolism/Excretion): Within normal limits for medication. (4 hours) Pharmacodynamics: Analgesic Effect: 75% Activity Facilitation: Medication(s) allow patient to sit, stand, walk, and do the basic ADLs Perceived Effectiveness: Described as relatively effective, allowing for increase in activities of daily living (ADL) Side-effects or Adverse reactions: Some constipation which apparently does respond well to the [place] Monitoring: [place] PMP: Compliant with practice rules and regulations UDS Results/interpretation: The patient's last UDS was done on 12/22/2014 and it came back within normal limits with no unexpected results. The patient remains compliant. Medication Assessment Form: Reviewed. Patient indicates being compliant with therapy Treatment compliance:  Compliant Risk Assessment: Substance Use Disorder (SUD) Risk Level: Low Opioid Risk Tool (ORT) Score: Total Score: 0 Low Risk for SUD (Score <3) Depression Scale Score:    Pharmacologic Plan: Continue therapy as is  Lab Work: Illicit Drugs No results found for: THCU,  COCAINSCRNUR, PCPSCRNUR, MDMA, AMPHETMU, METHADONE, ETOH  Inflammation Markers Lab Results  Component Value Date   ESRSEDRATE 6 05/29/2012    Renal Function Lab Results  Component Value Date   BUN 14 09/26/2013   CREATININE 0.89 09/26/2013   GFRAA >90 09/26/2013   GFRNONAA >90 09/26/2013    Hepatic Function Lab Results  Component Value Date   AST 51* 08/02/2013   ALT 74 08/02/2013   ALBUMIN 3.7 08/02/2013    Electrolytes Lab Results  Component Value Date   NA 134* 09/26/2013   K 5.2 09/26/2013   CL 98 09/26/2013   CALCIUM 9.9 09/26/2013   MG 2.3 08/31/2012    Post-Procedure Assessment  Procedure done on last visit: Diagnostic, right-sided, lumbar facet block under fluoroscopic guidance and IV sedation. Side-effects or Adverse reactions: None reported Sedation: Procedure was performed with sedation  Results: Ultra-Short Term Relief (First 1 hour after procedure): 75 %  Possibly the results is influenced by the pharmacodynamic effect of the local anesthetic, as well as that of the intravenous analgesics and/or sedatives, when used Short Term Relief (Initial 4-6 hrs after procedure): 75 % Short-term relief confirms injected site to be the source of pain Long Term Relief :  (1 week then started coming back and is 25% better) Long-term benefit would suggest an inflammatory etiology to the pain   Current Relief (Now):  50% for the lower back. Recurrance of pain could suggest persistent aggravating factors Interpretation of Results: Based on the results of this diagnostic injection, at least 75% of his low back pain on the right side seems to be coming from the lumbar facet joints.  Allergies  Mr.  No has No Known Allergies.  Meds  The patient has a current medication list which includes the following prescription(s): bisacodyl, canagliflozin-metformin hcl, gabapentin, invokamet, novolog flexpen, oxycodone, ramipril, rosuvastatin, tresiba flextouch, victoza, benefiber, oxycodone, and oxycodone.  Current Outpatient Prescriptions on File Prior to Visit  Medication Sig  . bisacodyl (DULCOLAX) 5 MG EC tablet Take 2 tablets (10 mg total) by mouth daily as needed for moderate constipation.  . INVOKAMET 50-1000 MG TABS TAKE 1 TABLET BY MOUTH 2 (TWO) TIMES DAILY.  Marland Kitchen NOVOLOG FLEXPEN 100 UNIT/ML FlexPen Inject 0-10 Units into the skin daily as needed for high blood sugar (sliding scale). Reported on 02/24/2015  . ramipril (ALTACE) 5 MG capsule Take 5 mg by mouth every morning.  . rosuvastatin (CRESTOR) 5 MG tablet Take 5 mg by mouth 3 (three) times a week. Monday Wednesday Friday  . TRESIBA FLEXTOUCH 100 UNIT/ML SOPN INJECT 12 UNITS SUBCUTANEOUSLY ONCE DAILY.  Marland Kitchen VICTOZA 18 MG/3ML SOPN INJECT 0.3 MLS (1.8 MG TOTAL) SUBCUTANEOUSLY ONCE DAILY.  Marland Kitchen Wheat Dextrin (BENEFIBER) POWD Stir 2 tsp. TID into 4-8 oz of any non-carbonated beverage or soft food (hot or cold)   No current facility-administered medications on file prior to visit.    ROS  Constitutional: Afebrile, no chills, well hydrated and well nourished Gastrointestinal: negative Musculoskeletal:negative Neurological: negative Behavioral/Psych: negative  PFSH  Medical:  Mr. Polanco  has a past medical history of Back pain; IBS (irritable bowel syndrome); History of kidney stones; Diabetes mellitus without complication (HCC); Preventative health care; Lateral meniscus tear; Arthritis, senescent (10/22/2014); and Chronic pain syndrome (10/22/2014). Family: family history includes COPD in his mother; Cancer in his mother; Diabetes in his father; Heart disease in his father and mother. Surgical:  has past surgical history that includes  Tonsillectomy; Shoulder surgery; Palate surgery; Cervical fusion; Foot surgery; Lumbar laminectomy/decompression  microdiscectomy (N/A, 02/06/2013); and Knee arthroscopy (Right, 10/02/2013). Tobacco:  reports that he has never smoked. He does not have any smokeless tobacco history on file. Alcohol:  reports that he drinks alcohol. Drug:  reports that he does not use illicit drugs.  Physical Exam  Vitals:  Today's Vitals   03/12/15 1102 03/12/15 1112  BP: 140/80   Pulse: 97   Temp: 98.6 F (37 C)   TempSrc: Oral   Resp: 15   Height:  (1.676 m)   Weight: 240 lb (108.863 kg)   SpO2: 98%   PainSc: 5  5   PainLoc: Back     Calculated BMI: Body mass index is 38.76 kg/(m^2).  General appearance: alert, cooperative, appears stated age, mild distress and moderately obese Eyes: PERLA Respiratory: No evidence respiratory distress, no audible rales or ronchi and no use of accessory muscles of respiration  Cervical Spine Inspection: Normal anatomy Alignment: Symetrical ROM: Adequate  Upper Extremities Inspection: No gross anomalies detected ROM: Adequate Sensory: Normal Motor: Unremarkable  Thoracic Spine Inspection: No gross anomalies detected Alignment: Symetrical ROM: Adequate  Lumbar Spine Inspection: No gross anomalies detected Alignment: Symetrical ROM: Decreased  Gait: WNL  Lower Extremities Inspection: No gross anomalies detected ROM: Decreased for the right knee. Sensory:  Normal Motor: Grossly intact  Assessment & Plan  Primary Diagnosis & Pertinent Problem List: The primary encounter diagnosis was Chronic pain (WC injury). Diagnoses of Encounter for therapeutic drug level monitoring, Long term current use of opiate analgesic, Opiate use (30 MME/Day), Lumbar facet syndrome (WC injury) (Right), Neurogenic pain, Neuropathic pain, Chronic pain, Chronic knee pain (Location of Secondary source of pain) (Right), Primary osteoarthritis of right knee, Chronic  shoulder pain (Location of Tertiary source of pain) (Right), Primary osteoarthritis of right shoulder, Chronic lower extremity pain (Right), and Recurrent nephrolithiasis were also pertinent to this visit.  Visit Diagnosis: 1. Chronic pain (WC injury)   2. Encounter for therapeutic drug level monitoring   3. Long term current use of opiate analgesic   4. Opiate use (30 MME/Day)   5. Lumbar facet syndrome (WC injury) (Right)   6. Neurogenic pain   7. Neuropathic pain   8. Chronic pain   9. Chronic knee pain (Location of Secondary source of pain) (Right)   10. Primary osteoarthritis of right knee   11. Chronic shoulder pain (Location of Tertiary source of pain) (Right)   12. Primary osteoarthritis of right shoulder   13. Chronic lower extremity pain (Right)   14. Recurrent nephrolithiasis     Problem-specific Plan(s): No problem-specific assessment & plan notes found for this encounter.   Plan of Care  Pharmacotherapy (Medications Ordered): Meds ordered this encounter  Medications  . gabapentin (NEURONTIN) 600 MG tablet    Sig: Take 1 tablet (600 mg total) by mouth every 6 (six) hours.    Dispense:  120 tablet    Refill:  2    Do not place this medication, or any other prescription from our practice, on "Automatic Refill". Patient may have prescription filled one day early if pharmacy is closed on scheduled refill date.  Marland Kitchen oxyCODONE (OXY IR/ROXICODONE) 5 MG immediate release tablet    Sig: Take 1 tablet (5 mg total) by mouth every 6 (six) hours as needed for moderate pain or severe pain.    Dispense:  120 tablet    Refill:  0    Do not place this medication, or any other prescription from our practice, on "Automatic Refill". Patient may  have prescription filled one day early if pharmacy is closed on scheduled refill date. Do not fill until: 03/19/15 To last until: 04/18/15  . oxyCODONE (OXY IR/ROXICODONE) 5 MG immediate release tablet    Sig: Take 1 tablet (5 mg total) by mouth  every 6 (six) hours as needed for moderate pain or severe pain.    Dispense:  120 tablet    Refill:  0    Do not place this medication, or any other prescription from our practice, on "Automatic Refill". Patient may have prescription filled one day early if pharmacy is closed on scheduled refill date. Do not fill until: 04/18/15 To last until: 05/18/15  . oxyCODONE (OXY IR/ROXICODONE) 5 MG immediate release tablet    Sig: Take 1 tablet (5 mg total) by mouth every 6 (six) hours as needed for moderate pain or severe pain.    Dispense:  120 tablet    Refill:  0    Do not place this medication, or any other prescription from our practice, on "Automatic Refill". Patient may have prescription filled one day early if pharmacy is closed on scheduled refill date. Do not fill until: 05/18/15 To last until: 06/17/15    [facility] & Procedure Ordered: Orders Placed This Encounter  Procedures  . LUMBAR FACET(MEDIAL BRANCH NERVE BLOCK) MBNB    Standing Status: Standing     Number of Occurrences: 1     Standing Expiration Date: 03/11/2016    Scheduling Instructions:     Side: Right-sided     Level: L2, L3, L4, L5, & S1 Medial Branch Nerve     Sedation: With Sedation.     Timeframe: PRN Procedure. Patient will call to schedule.    Order Specific Question:  Where will this procedure be performed?    Answer:  [facility]  . ToxASSURE Select 13 (MW), Urine    Volume: 30 ml(s). Minimum 3 ml of urine is needed. Document temperature of fresh sample. Indications: Long term (current) use of opiate analgesic (Z79.891)  . Comprehensive metabolic panel    Order Specific Question:  Has the patient fasted?    Answer:  No  . C-reactive protein  . Magnesium  . Sedimentation rate  . Vitamin B12    Indication: Bone Pain (M89.9)  . Vitamin D pnl(25-hydrxy+1,25-dihy)-bld    Imaging Ordered: None  Interventional Therapies: Scheduled: The patient is pending to have knee surgery in 2 weeks from now.  Therefore, will not be scheduling him for anything at this time. PRN Procedures: After the patient is done with the surgery and that recovery from the surgery (6-8 weeks) then we may repeat the lumbar facet block under fluoroscopic guidance and IV sedation. Should he continue to get good relief of the pain, but no long-term benefit, we will consider radiofrequency ablation.    Referral(s) or Consult(s): None at this time.  Medications administered during this visit: Mr. Knupp had no medications administered during this visit.  Future Appointments Date Time Provider Department Center  03/13/2015 9:00 AM [facility] PAT 2 [facility] None  06/08/2015 8:00 AM Delano Metz, MD [facility] None    Primary Care Physician: No PCP Per Patient Location: [facility] Facility Note by: Sydnee Levans. Laban Emperor, M.D, DABA, DABAPM, DABPM, DABIPP, FIPP

## 2015-03-12 NOTE — Progress Notes (Signed)
Safety precautions to be maintained throughout the outpatient stay will include: orient to surroundings, keep bed in low position, maintain call bell within reach at all times, provide assistance with transfer out of bed and ambulation. Patient is having right knee surgery on 03-25-15.

## 2015-03-12 NOTE — Progress Notes (Signed)
Preoperative surgical orders have been place into the Epic hospital system for Adam Petty. on 03/12/2015, 2:43 PM  by [name] for surgery on 03-25-2015.  Preop Knee Scope orders including IV Tylenol and IV Decadron as long as there are no contraindications to the above medications. Avel Peace, PA-C

## 2015-03-13 ENCOUNTER — Encounter (HOSPITAL_COMMUNITY): Payer: Self-pay

## 2015-03-13 ENCOUNTER — Encounter (HOSPITAL_COMMUNITY)
Admission: RE | Admit: 2015-03-13 | Discharge: 2015-03-13 | Disposition: A | Discharge status: Home / Self Care | Payer: Worker's Compensation | Source: Ambulatory Visit | Attending: Orthopedic Surgery | Admitting: Orthopedic Surgery

## 2015-03-13 DIAGNOSIS — X58XXXA Exposure to other specified factors, initial encounter: Secondary | ICD-10-CM | POA: Diagnosis not present

## 2015-03-13 DIAGNOSIS — Z01812 Encounter for preprocedural laboratory examination: Secondary | ICD-10-CM | POA: Insufficient documentation

## 2015-03-13 DIAGNOSIS — E119 Type 2 diabetes mellitus without complications: Secondary | ICD-10-CM | POA: Insufficient documentation

## 2015-03-13 DIAGNOSIS — S83241A Other tear of medial meniscus, current injury, right knee, initial encounter: Secondary | ICD-10-CM | POA: Insufficient documentation

## 2015-03-13 DIAGNOSIS — M7041 Prepatellar bursitis, right knee: Secondary | ICD-10-CM | POA: Insufficient documentation

## 2015-03-13 LAB — CBC
HEMATOCRIT: 46.4 % (ref 39.0–52.0)
HEMOGLOBIN: 15.3 g/dL (ref 13.0–17.0)
MCH: 28.5 pg (ref 26.0–34.0)
MCHC: 33 g/dL (ref 30.0–36.0)
MCV: 86.4 fL (ref 78.0–100.0)
Platelets: 176 10*3/uL (ref 150–400)
RBC: 5.37 MIL/uL (ref 4.22–5.81)
RDW: 13.9 % (ref 11.5–15.5)
WBC: 9 10*3/uL (ref 4.0–10.5)

## 2015-03-13 NOTE — Progress Notes (Signed)
Note fax report with chart today on 03-11-15 Hgb A1c level =9.3, done at [facility]- "pt is known Diabetic on Insulin and oral meds. "States had Steroid injections a month ago for back pain.

## 2015-03-13 NOTE — Patient Instructions (Signed)
Adam Petty.  03/13/2015   Your procedure is scheduled on: 03-25-15  Report to [facility] Main  Entrance take [facility]  elevators to 3rd floor to  Short Stay Center at 1:30 PM  Call this number if you have problems the morning of surgery [phone]   Remember: ONLY 1 PERSON MAY GO WITH YOU TO SHORT STAY TO GET  READY MORNING OF YOUR SURGERY.  Do not eat food or drink liquids :After Midnight.     Take these medicines the morning of surgery with A SIP OF WATER: Gabapentin. Oxycodone. Rosuvatatin-if desires. -No Insulin or Diabetic meds. DO NOT TAKE ANY DIABETIC MEDICATIONS DAY OF YOUR SURGERY                               You may not have any metal on your body including hair pins and              piercings  Do not wear jewelry, make-up, lotions, powders or perfumes, deodorant             Do not wear nail polish.  Do not shave  48 hours prior to surgery.              Men may shave face and neck.   Do not bring valuables to the hospital. [facility] IS NOT             RESPONSIBLE   FOR VALUABLES.  Contacts, dentures or bridgework may not be worn into surgery.  Leave suitcase in the car. After surgery it may be brought to your room.     Patients discharged the day of surgery will not be allowed to drive home.  Name and phone number of your driver:Sandra -spouse [phone] cell  Special Instructions: N/A              Please read over the following fact sheets you were given: _____________________________________________________________________             [facility] - Preparing for Surgery Before surgery, you can play an important role.  Because skin is not sterile, your skin needs to be as free of germs as possible.  You can reduce the number of germs on your skin by washing with CHG (chlorahexidine gluconate) soap before surgery.  CHG is an antiseptic cleaner which kills germs and bonds with the skin to continue killing germs even after  washing. Please DO NOT use if you have an allergy to CHG or antibacterial soaps.  If your skin becomes reddened/irritated stop using the CHG and inform your nurse when you arrive at Short Stay. Do not shave (including legs and underarms) for at least 48 hours prior to the first CHG shower.  You may shave your face/neck. Please follow these instructions carefully:  1.  Shower with CHG Soap the night before surgery and the  morning of Surgery.  2.  If you choose to wash your hair, wash your hair first as usual with your  normal  shampoo.  3.  After you shampoo, rinse your hair and body thoroughly to remove the  shampoo.                           4.  Use CHG as you would any other liquid soap.  You  can apply chg directly  to the skin and wash                       Gently with a scrungie or clean washcloth.  5.  Apply the CHG Soap to your body ONLY FROM THE NECK DOWN.   Do not use on face/ open                           Wound or open sores. Avoid contact with eyes, ears mouth and genitals (private parts).                       Wash face,  Genitals (private parts) with your normal soap.             6.  Wash thoroughly, paying special attention to the area where your surgery  will be performed.  7.  Thoroughly rinse your body with warm water from the neck down.  8.  DO NOT shower/wash with your normal soap after using and rinsing off  the CHG Soap.                9.  Pat yourself dry with a clean towel.            10.  Wear clean pajamas.            11.  Place clean sheets on your bed the night of your first shower and do not  sleep with pets. Day of Surgery : Do not apply any lotions/deodorants the morning of surgery.  Please wear clean clothes to the hospital/surgery center.  FAILURE TO FOLLOW THESE INSTRUCTIONS MAY RESULT IN THE CANCELLATION OF YOUR SURGERY PATIENT SIGNATURE_________________________________  NURSE  SIGNATURE__________________________________  ________________________________________________________________________   Adam Petty  An incentive spirometer is a tool that can help keep your lungs clear and active. This tool measures how well you are filling your lungs with each breath. Taking long deep breaths may help reverse or decrease the chance of developing breathing (pulmonary) problems (especially infection) following:  A long period of time when you are unable to move or be active. BEFORE THE PROCEDURE   If the spirometer includes an indicator to show your best effort, your nurse or respiratory therapist will set it to a desired goal.  If possible, sit up straight or lean slightly forward. Try not to slouch.  Hold the incentive spirometer in an upright position. INSTRUCTIONS FOR USE  1. Sit on the edge of your bed if possible, or sit up as far as you can in bed or on a chair. 2. Hold the incentive spirometer in an upright position. 3. Breathe out normally. 4. Place the mouthpiece in your mouth and seal your lips tightly around it. 5. Breathe in slowly and as deeply as possible, raising the piston or the ball toward the top of the column. 6. Hold your breath for 3-5 seconds or for as long as possible. Allow the piston or ball to fall to the bottom of the column. 7. Remove the mouthpiece from your mouth and breathe out normally. 8. Rest for a few seconds and repeat Steps 1 through 7 at least 10 times every 1-2 hours when you are awake. Take your time and take a few normal breaths between deep breaths. 9. The spirometer may include an indicator to show your best effort. Use the indicator as a goal to work toward during  each repetition. 10. After each set of 10 deep breaths, practice coughing to be sure your lungs are clear. If you have an incision (the cut made at the time of surgery), support your incision when coughing by placing a pillow or rolled up towels firmly  against it. Once you are able to get out of bed, walk around indoors and cough well. You may stop using the incentive spirometer when instructed by your caregiver.  RISKS AND COMPLICATIONS  Take your time so you do not get dizzy or light-headed.  If you are in pain, you may need to take or ask for pain medication before doing incentive spirometry. It is harder to take a deep breath if you are having pain. AFTER USE  Rest and breathe slowly and easily.  It can be helpful to keep track of a log of your progress. Your caregiver can provide you with a simple table to help with this. If you are using the spirometer at home, follow these instructions: [place] IF:   You are having difficultly using the spirometer.  You have trouble using the spirometer as often as instructed.  Your pain medication is not giving enough relief while using the spirometer.  You develop fever of 100.5 F (38.1 C) or higher. SEEK IMMEDIATE MEDICAL CARE IF:   You cough up bloody sputum that had not been present before.  You develop fever of 102 F (38.9 C) or greater.  You develop worsening pain at or near the incision site. MAKE SURE YOU:   Understand these instructions.  Will watch your condition.  Will get help right away if you are not doing well or get worse. Document Released: 05/16/2006 Document Revised: 03/28/2011 Document Reviewed: 07/17/2006 [facility] Patient Information 2014 [place], [place].   ________________________________________________________________________

## 2015-03-13 NOTE — Pre-Procedure Instructions (Addendum)
03-13-15 A1C = 9.3 done 03-11-15 [facility] with chart.- faxed info  To Dr. Lequita Halt  [phone]. EKG done today. CMP 03-12-15 Epic. Stress/Echo 8'14 reports with chart.

## 2015-03-17 ENCOUNTER — Other Ambulatory Visit: Payer: Self-pay

## 2015-03-17 NOTE — Progress Notes (Signed)
Quick Note:   Normal fasting (NPO x 8 hours) glucose levels are between 65-99 mg/dl, with 2 hour fasting, levels are usually less than 140 mg/dl. Any random blood glucose level greater than 200 mg/dl is considered to be Diabetes.  ______ 

## 2015-03-17 NOTE — Progress Notes (Signed)
Quick Note:  Lab results reviewed and found to be within normal limits. ______ 

## 2015-03-18 ENCOUNTER — Encounter: Payer: Self-pay | Admitting: Pain Medicine

## 2015-03-20 LAB — TOXASSURE SELECT 13 (MW), URINE: PDF: 0

## 2015-03-24 NOTE — H&P (Signed)
CC- Elder NegusFranklin W Clyde CanterburyBridges Jr. is a 55 y.o. male who presents with right knee pain.  HPI- . Knee Pain: Patient presents with knee pain involving the  right knee. Onset of the symptoms was several months ago. Inciting event: work related injury. Current symptoms include giving out, locking and pain located medially. Pain is aggravated by kneeling, lateral movements, pivoting, rising after sitting and walking.  Patient has had prior knee problems. Evaluation to date: MRI: abnormal medial meniscal tear. Treatment to date: corticosteroid injection which was not very effective. He also has significant symptomatic pre-patellar bursitis which has been causing significant pain  Past Medical History  Diagnosis Date  . Back pain     history spinal stenosis, DDD  . IBS (irritable bowel syndrome)   . History of kidney stones   . Diabetes mellitus without complication (HCC)   . Preventative health care     takes crestor/ altace for "preventative reasons" denies heart or BP problems  . Lateral meniscus tear     rt knee  . Arthritis, senescent 10/22/2014  . Chronic pain syndrome 10/22/2014    Past Surgical History  Procedure Laterality Date  . Tonsillectomy    . Shoulder surgery      x5 on RT / x1 LFT  . Palate surgery      surg for sleep apnea and sinus issues  . Cervical fusion    . Foot surgery      RT  . Lumbar laminectomy/decompression microdiscectomy N/A 02/06/2013    Procedure: MICRO LUMBAR DECOMPRESSION L3-L4 and L4 - L5 ;  Surgeon: Javier DockerJeffrey C Beane, MD;  Location: [facility];  Service: Orthopedics;  Laterality: N/A;  . Knee arthroscopy Right 10/02/2013    Procedure: RIGHT KNEE ARTHROSCOPY, PARTIAL LATERAL MENISCECTOMY, DEBRIDEMENT, MEDIAL AND LATERAL CHONDROPLASTY;  Surgeon: Loanne DrillingFrank Kayann Maj V, MD;  Location: [facility];  Service: Orthopedics;  Laterality: Right;    Prior to Admission medications   Medication Sig Start Date End Date Taking? Authorizing Provider  INVOKAMET 50-1000 MG TABS TAKE 1 TABLET  BY MOUTH 2 (TWO) TIMES DAILY. 02/12/15  Yes Historical Provider, MD  NOVOLOG FLEXPEN 100 UNIT/ML FlexPen Inject 0-10 Units into the skin daily as needed for high blood sugar (sliding scale). Reported on 02/24/2015 02/23/15  Yes Historical Provider, MD  ramipril (ALTACE) 5 MG capsule Take 5 mg by mouth every morning.   Yes Historical Provider, MD  rosuvastatin (CRESTOR) 5 MG tablet Take 5 mg by mouth 3 (three) times a week. Monday Wednesday Friday   Yes Historical Provider, MD  TRESIBA FLEXTOUCH 100 UNIT/ML SOPN INJECT 12 UNITS SUBCUTANEOUSLY ONCE DAILY. 01/22/15  Yes Historical Provider, MD  VICTOZA 18 MG/3ML SOPN INJECT 0.3 MLS (1.8 MG TOTAL) SUBCUTANEOUSLY ONCE DAILY. 02/17/15  Yes Historical Provider, MD  bisacodyl (DULCOLAX) 5 MG EC tablet Take 2 tablets (10 mg total) by mouth daily as needed for moderate constipation. 12/22/14   Delano MetzFrancisco Naveira, MD  Canagliflozin-Metformin HCl (INVOKAMET) 50-1000 MG TABS  03/11/15   Historical Provider, MD  gabapentin (NEURONTIN) 600 MG tablet Take 1 tablet (600 mg total) by mouth every 6 (six) hours. 03/12/15   Delano MetzFrancisco Naveira, MD  oxyCODONE (OXY IR/ROXICODONE) 5 MG immediate release tablet Take 1 tablet (5 mg total) by mouth every 6 (six) hours as needed for moderate pain or severe pain. 03/12/15   Delano MetzFrancisco Naveira, MD  oxyCODONE (OXY IR/ROXICODONE) 5 MG immediate release tablet Take 1 tablet (5 mg total) by mouth every 6 (six) hours as needed for moderate pain  or severe pain. 03/12/15   Delano Metz, MD  oxyCODONE (OXY IR/ROXICODONE) 5 MG immediate release tablet Take 1 tablet (5 mg total) by mouth every 6 (six) hours as needed for moderate pain or severe pain. 03/12/15   Delano Metz, MD  Wheat Dextrin (BENEFIBER) POWD Stir 2 tsp. TID into 4-8 oz of any non-carbonated beverage or soft food (hot or cold) 12/22/14    Delano Metz, MD   KNEE EXAM antalgic gait, soft tissue tenderness over anterior knee and medial joint line, effusion, negative drawer sign,  collateral ligaments intact, negative Lachman sign  Physical Examination: General appearance - alert, well appearing, and in no distress Mental status - alert, oriented to person, place, and time Chest - clear to auscultation, no wheezes, rales or rhonchi, symmetric air entry Heart - normal rate, regular rhythm, normal S1, S2, no murmurs, rubs, clicks or gallops Abdomen - soft, nontender, nondistended, no masses or organomegaly   Asessment/Plan--- Left knee medial meniscal tear and prepatellar bursitis- - Plan left knee arthroscopy with meniscal debridement and left knee bursectomy. Procedure risks and potential comps discussed with patient who elects to proceed. Goals are decreased pain and increased function with a high likelihood of achieving both

## 2015-03-25 ENCOUNTER — Ambulatory Visit (HOSPITAL_COMMUNITY): Payer: Worker's Compensation | Admitting: Certified Registered Nurse Anesthetist

## 2015-03-25 ENCOUNTER — Ambulatory Visit (HOSPITAL_COMMUNITY)
Admission: RE | Admit: 2015-03-25 | Discharge: 2015-03-25 | Disposition: A | Discharge status: Home / Self Care | Payer: Worker's Compensation | Source: Ambulatory Visit | Attending: Orthopedic Surgery | Admitting: Orthopedic Surgery

## 2015-03-25 ENCOUNTER — Encounter (HOSPITAL_COMMUNITY): Payer: Self-pay | Admitting: *Deleted

## 2015-03-25 ENCOUNTER — Encounter (HOSPITAL_COMMUNITY)
Admission: RE | Disposition: A | Discharge status: Home / Self Care | Payer: Self-pay | Source: Ambulatory Visit | Attending: Orthopedic Surgery

## 2015-03-25 DIAGNOSIS — S83249A Other tear of medial meniscus, current injury, unspecified knee, initial encounter: Secondary | ICD-10-CM | POA: Diagnosis present

## 2015-03-25 DIAGNOSIS — M7041 Prepatellar bursitis, right knee: Secondary | ICD-10-CM | POA: Diagnosis not present

## 2015-03-25 DIAGNOSIS — M1990 Unspecified osteoarthritis, unspecified site: Secondary | ICD-10-CM | POA: Insufficient documentation

## 2015-03-25 DIAGNOSIS — E119 Type 2 diabetes mellitus without complications: Secondary | ICD-10-CM | POA: Diagnosis not present

## 2015-03-25 DIAGNOSIS — M23221 Derangement of posterior horn of medial meniscus due to old tear or injury, right knee: Secondary | ICD-10-CM | POA: Insufficient documentation

## 2015-03-25 DIAGNOSIS — G894 Chronic pain syndrome: Secondary | ICD-10-CM | POA: Diagnosis not present

## 2015-03-25 DIAGNOSIS — Z79899 Other long term (current) drug therapy: Secondary | ICD-10-CM | POA: Insufficient documentation

## 2015-03-25 DIAGNOSIS — M2341 Loose body in knee, right knee: Secondary | ICD-10-CM | POA: Diagnosis not present

## 2015-03-25 DIAGNOSIS — Z794 Long term (current) use of insulin: Secondary | ICD-10-CM | POA: Diagnosis not present

## 2015-03-25 DIAGNOSIS — Z6841 Body Mass Index (BMI) 40.0 and over, adult: Secondary | ICD-10-CM | POA: Diagnosis not present

## 2015-03-25 DIAGNOSIS — G8929 Other chronic pain: Secondary | ICD-10-CM

## 2015-03-25 HISTORY — PX: KNEE BURSECTOMY: SHX5882

## 2015-03-25 HISTORY — PX: KNEE ARTHROSCOPY WITH MEDIAL MENISECTOMY: SHX5651

## 2015-03-25 LAB — GLUCOSE, CAPILLARY
GLUCOSE-CAPILLARY: 150 mg/dL — AB (ref 65–99)
Glucose-Capillary: 126 mg/dL — ABNORMAL HIGH (ref 65–99)

## 2015-03-25 SURGERY — ARTHROSCOPY, KNEE, WITH MEDIAL MENISCECTOMY
Anesthesia: General | Site: Knee | Laterality: Right

## 2015-03-25 MED ORDER — ACETAMINOPHEN 10 MG/ML IV SOLN
INTRAVENOUS | Status: AC
  Filled 2015-03-25: qty 100

## 2015-03-25 MED ORDER — PROPOFOL 10 MG/ML IV BOLUS
INTRAVENOUS | Status: DC | PRN
  Administered 2015-03-25: 200 mg via INTRAVENOUS

## 2015-03-25 MED ORDER — METHOCARBAMOL 500 MG PO TABS: 500.0000 mg | ORAL_TABLET | Freq: Four times a day (QID) | ORAL | Status: DC

## 2015-03-25 MED ORDER — ONDANSETRON HCL 4 MG/2ML IJ SOLN
INTRAMUSCULAR | Status: DC | PRN
  Administered 2015-03-25: 4 mg via INTRAVENOUS

## 2015-03-25 MED ORDER — ACETAMINOPHEN 10 MG/ML IV SOLN
1000.0000 mg | Freq: Once | INTRAVENOUS | Status: AC
  Administered 2015-03-25: 1000 mg via INTRAVENOUS

## 2015-03-25 MED ORDER — PROMETHAZINE HCL 25 MG/ML IJ SOLN: 6.2500 mg | INTRAMUSCULAR | Status: DC | PRN

## 2015-03-25 MED ORDER — PHENYLEPHRINE HCL 10 MG/ML IJ SOLN
INTRAMUSCULAR | Status: DC | PRN
  Administered 2015-03-25: 80 ug via INTRAVENOUS

## 2015-03-25 MED ORDER — OXYCODONE HCL 5 MG PO TABS
5.0000 mg | ORAL_TABLET | Freq: Once | ORAL | Status: AC
  Administered 2015-03-25: 5 mg via ORAL
  Filled 2015-03-25: qty 1

## 2015-03-25 MED ORDER — LIDOCAINE HCL (CARDIAC) 20 MG/ML IV SOLN
INTRAVENOUS | Status: AC
  Filled 2015-03-25: qty 5

## 2015-03-25 MED ORDER — KETOROLAC TROMETHAMINE 30 MG/ML IJ SOLN
INTRAMUSCULAR | Status: DC | PRN
  Administered 2015-03-25: 30 mg via INTRAVENOUS

## 2015-03-25 MED ORDER — PROPOFOL 10 MG/ML IV BOLUS
INTRAVENOUS | Status: AC
  Filled 2015-03-25: qty 20

## 2015-03-25 MED ORDER — LIDOCAINE HCL (CARDIAC) 20 MG/ML IV SOLN
INTRAVENOUS | Status: DC | PRN
  Administered 2015-03-25: 100 mg via INTRAVENOUS

## 2015-03-25 MED ORDER — CHLORHEXIDINE GLUCONATE 4 % EX LIQD: 60.0000 mL | Freq: Once | CUTANEOUS | Status: DC

## 2015-03-25 MED ORDER — OXYCODONE HCL 5 MG PO TABS: 5.0000 mg | ORAL_TABLET | ORAL | Status: DC | PRN

## 2015-03-25 MED ORDER — BUPIVACAINE-EPINEPHRINE (PF) 0.25% -1:200000 IJ SOLN
INTRAMUSCULAR | Status: AC
  Filled 2015-03-25: qty 30

## 2015-03-25 MED ORDER — FENTANYL CITRATE (PF) 100 MCG/2ML IJ SOLN
INTRAMUSCULAR | Status: AC
  Filled 2015-03-25: qty 2

## 2015-03-25 MED ORDER — FENTANYL CITRATE (PF) 100 MCG/2ML IJ SOLN
INTRAMUSCULAR | Status: DC | PRN
  Administered 2015-03-25 (×4): 50 ug via INTRAVENOUS

## 2015-03-25 MED ORDER — MIDAZOLAM HCL 5 MG/5ML IJ SOLN
INTRAMUSCULAR | Status: DC | PRN
  Administered 2015-03-25: 2 mg via INTRAVENOUS

## 2015-03-25 MED ORDER — DEXAMETHASONE SODIUM PHOSPHATE 10 MG/ML IJ SOLN
10.0000 mg | Freq: Once | INTRAMUSCULAR | Status: AC
  Administered 2015-03-25: 10 mg via INTRAVENOUS

## 2015-03-25 MED ORDER — KETOROLAC TROMETHAMINE 30 MG/ML IJ SOLN
INTRAMUSCULAR | Status: AC
  Filled 2015-03-25: qty 1

## 2015-03-25 MED ORDER — SODIUM CHLORIDE 0.9 % IV SOLN: INTRAVENOUS | Status: DC

## 2015-03-25 MED ORDER — LACTATED RINGERS IV SOLN
INTRAVENOUS | Status: DC
  Administered 2015-03-25: 16:00:00 via INTRAVENOUS
  Administered 2015-03-25: 1000 mL via INTRAVENOUS

## 2015-03-25 MED ORDER — CEFAZOLIN SODIUM-DEXTROSE 2-3 GM-% IV SOLR
2.0000 g | INTRAVENOUS | Status: AC
  Administered 2015-03-25: 2 g via INTRAVENOUS

## 2015-03-25 MED ORDER — CEFAZOLIN SODIUM-DEXTROSE 2-3 GM-% IV SOLR
INTRAVENOUS | Status: AC
  Filled 2015-03-25: qty 50

## 2015-03-25 MED ORDER — FENTANYL CITRATE (PF) 100 MCG/2ML IJ SOLN
25.0000 ug | INTRAMUSCULAR | Status: DC | PRN
  Administered 2015-03-25: 50 ug via INTRAVENOUS

## 2015-03-25 MED ORDER — MIDAZOLAM HCL 2 MG/2ML IJ SOLN
INTRAMUSCULAR | Status: AC
  Filled 2015-03-25: qty 2

## 2015-03-25 MED ORDER — DEXAMETHASONE SODIUM PHOSPHATE 10 MG/ML IJ SOLN
INTRAMUSCULAR | Status: AC
  Filled 2015-03-25: qty 1

## 2015-03-25 MED ORDER — FENTANYL CITRATE (PF) 250 MCG/5ML IJ SOLN
INTRAMUSCULAR | Status: AC
  Filled 2015-03-25: qty 5

## 2015-03-25 MED ORDER — LACTATED RINGERS IR SOLN
Status: DC | PRN
  Administered 2015-03-25: 7000 mL

## 2015-03-25 MED ORDER — ONDANSETRON HCL 4 MG/2ML IJ SOLN
INTRAMUSCULAR | Status: AC
  Filled 2015-03-25: qty 2

## 2015-03-25 MED ORDER — BUPIVACAINE-EPINEPHRINE 0.25% -1:200000 IJ SOLN
INTRAMUSCULAR | Status: DC | PRN
  Administered 2015-03-25: 20 mL
  Administered 2015-03-25: 10 mL

## 2015-03-25 SURGICAL SUPPLY — 57 items
BAG SPEC THK2 15X12 ZIP CLS (MISCELLANEOUS) ×1
BAG ZIPLOCK 12X15 (MISCELLANEOUS) ×3 IMPLANT
BANDAGE ACE 6X5 VEL STRL LF (GAUZE/BANDAGES/DRESSINGS) ×3 IMPLANT
BANDAGE ESMARK 6X9 LF (GAUZE/BANDAGES/DRESSINGS) IMPLANT
BLADE 4.2CUDA (BLADE) ×3 IMPLANT
BNDG CMPR 9X6 STRL LF SNTH (GAUZE/BANDAGES/DRESSINGS) ×1
BNDG COHESIVE 4X5 TAN STRL (GAUZE/BANDAGES/DRESSINGS) ×3 IMPLANT
BNDG ESMARK 6X9 LF (GAUZE/BANDAGES/DRESSINGS) ×3
BNDG GAUZE ELAST 4 BULKY (GAUZE/BANDAGES/DRESSINGS) ×3 IMPLANT
CLOSURE WOUND 1/2 X4 (GAUZE/BANDAGES/DRESSINGS) ×1
CUFF TOURN SGL QUICK 34 (TOURNIQUET CUFF) ×3
CUFF TRNQT CYL 34X4X40X1 (TOURNIQUET CUFF) ×1 IMPLANT
DRAPE INCISE IOBAN 66X45 STRL (DRAPES) ×3 IMPLANT
DRAPE U-SHAPE 47X51 STRL (DRAPES) ×3 IMPLANT
DRSG ADAPTIC 3X8 NADH LF (GAUZE/BANDAGES/DRESSINGS) ×2 IMPLANT
DRSG EMULSION OIL 3X16 NADH (GAUZE/BANDAGES/DRESSINGS) ×3 IMPLANT
DRSG EMULSION OIL 3X3 NADH (GAUZE/BANDAGES/DRESSINGS) ×3 IMPLANT
DRSG PAD ABDOMINAL 8X10 ST (GAUZE/BANDAGES/DRESSINGS) ×3 IMPLANT
DURAPREP 26ML APPLICATOR (WOUND CARE) ×3 IMPLANT
ELECT REM PT RETURN 9FT ADLT (ELECTROSURGICAL) ×3
ELECTRODE REM PT RTRN 9FT ADLT (ELECTROSURGICAL) ×1 IMPLANT
GAUZE SPONGE 4X4 12PLY STRL (GAUZE/BANDAGES/DRESSINGS) ×3 IMPLANT
GLOVE BIO SURGEON STRL SZ7.5 (GLOVE) ×3 IMPLANT
GLOVE BIO SURGEON STRL SZ8 (GLOVE) ×3 IMPLANT
GLOVE BIOGEL PI IND STRL 8 (GLOVE) ×1 IMPLANT
GLOVE BIOGEL PI IND STRL 8.5 (GLOVE) ×1 IMPLANT
GLOVE BIOGEL PI INDICATOR 8 (GLOVE) ×2
GLOVE BIOGEL PI INDICATOR 8.5 (GLOVE) ×2
GOWN STRL REUS W/TWL LRG LVL3 (GOWN DISPOSABLE) ×6 IMPLANT
GOWN STRL REUS W/TWL XL LVL3 (GOWN DISPOSABLE) ×6 IMPLANT
IMMOBILIZER KNEE 20 (SOFTGOODS)
IMMOBILIZER KNEE 20 THIGH 36 (SOFTGOODS) IMPLANT
KIT BASIN OR (CUSTOM PROCEDURE TRAY) ×3 IMPLANT
MANIFOLD NEPTUNE II (INSTRUMENTS) ×3 IMPLANT
NDL SAFETY ECLIPSE 18X1.5 (NEEDLE) ×1 IMPLANT
NEEDLE HYPO 18GX1.5 SHARP (NEEDLE) ×3
NS IRRIG 1000ML POUR BTL (IV SOLUTION) ×3 IMPLANT
PACK ARTHROSCOPY WL (CUSTOM PROCEDURE TRAY) ×3 IMPLANT
PACK ICE MAXI GEL EZY WRAP (MISCELLANEOUS) ×9 IMPLANT
PACK ORTHO EXTREMITY (CUSTOM PROCEDURE TRAY) ×3 IMPLANT
PAD CAST 4YDX4 CTTN HI CHSV (CAST SUPPLIES) ×1 IMPLANT
PADDING CAST COTTON 4X4 STRL (CAST SUPPLIES) ×3
PADDING CAST COTTON 6X4 STRL (CAST SUPPLIES) ×4 IMPLANT
POSITIONER SURGICAL ARM (MISCELLANEOUS) ×3 IMPLANT
SPONGE LAP 4X18 X RAY DECT (DISPOSABLE) ×3 IMPLANT
STRIP CLOSURE SKIN 1/2X4 (GAUZE/BANDAGES/DRESSINGS) ×1 IMPLANT
SUT ETHILON 4 0 PS 2 18 (SUTURE) ×3 IMPLANT
SUT VIC AB 1 CT1 27 (SUTURE) ×3
SUT VIC AB 1 CT1 27XBRD ANTBC (SUTURE) ×1 IMPLANT
SUT VIC AB 2-0 CT1 27 (SUTURE) ×3
SUT VIC AB 2-0 CT1 27XBRD (SUTURE) ×1 IMPLANT
SYR 50ML LL SCALE MARK (SYRINGE) ×3 IMPLANT
TOWEL OR 17X26 10 PK STRL BLUE (TOWEL DISPOSABLE) ×6 IMPLANT
TUBING ARTHRO INFLOW-ONLY STRL (TUBING) ×3 IMPLANT
WAND HAND CNTRL MULTIVAC 90 (MISCELLANEOUS) IMPLANT
WATER STERILE IRR 1500ML POUR (IV SOLUTION) ×3 IMPLANT
WRAP KNEE MAXI GEL POST OP (GAUZE/BANDAGES/DRESSINGS) ×4 IMPLANT

## 2015-03-25 NOTE — Anesthesia Preprocedure Evaluation (Addendum)
Anesthesia Evaluation  Patient identified by MRN, date of birth, ID band Patient awake    Reviewed: Allergy & Precautions, NPO status , Patient's Chart, lab work & pertinent test results  Airway Mallampati: II  TM Distance: <3 FB Neck ROM: Full    Dental  (+) Teeth Intact, Dental Advisory Given   Pulmonary neg pulmonary ROS,    Pulmonary exam normal breath sounds clear to auscultation       Cardiovascular negative cardio ROS Normal cardiovascular exam Rhythm:Regular Rate:Normal     Neuro/Psych  Neuromuscular disease (spinal stenosis) negative psych ROS   GI/Hepatic negative GI ROS, Neg liver ROS,   Endo/Other  diabetes, Type 2, Insulin Dependent, Oral Hypoglycemic AgentsMorbid obesity  Renal/GU nephrolithiasis     Musculoskeletal  (+) Arthritis , Osteoarthritis,    Abdominal   Peds  Hematology negative hematology ROS (+)   Anesthesia Other Findings Day of surgery medications reviewed with the patient.  Reproductive/Obstetrics                           Anesthesia Physical Anesthesia Plan  ASA: III  Anesthesia Plan: General   Post-op Pain Management:    Induction: Intravenous  Airway Management Planned: LMA  Additional Equipment:   Intra-op Plan:   Post-operative Plan: Extubation in OR  Informed Consent: I have reviewed the patients History and Physical, chart, labs and discussed the procedure including the risks, benefits and alternatives for the proposed anesthesia with the patient or authorized representative who has indicated his/her understanding and acceptance.   Dental advisory given  Plan Discussed with: CRNA  Anesthesia Plan Comments: (Risks/benefits of general anesthesia discussed with patient including risk of damage to teeth, lips, gum, and tongue, nausea/vomiting, allergic reactions to medications, and the possibility of heart attack, stroke and death.  All patient  questions answered.  Patient wishes to proceed.)        Anesthesia Quick Evaluation

## 2015-03-25 NOTE — Interval H&P Note (Signed)
History and Physical Interval Note:  03/25/2015 2:44 PM  Adam BergFranklin W Bultman Jr.  has presented today for surgery, with the diagnosis of RIGHT KNEE MEDIAL MENISCUS TEAR, RIGHT KNEE PREPATELLAR BURSITIS  The various methods of treatment have been discussed with the patient and family. After consideration of risks, benefits and other options for treatment, the patient has consented to  Procedure(s): RIGHT KNEE ARTHROSCOPY WITH MENISCAL DEBRIDEMENT (Right) RIGHT KNEE PREPATELLA BURSECTOMY (Right) as a surgical intervention .  The patient's history has been reviewed, patient examined, no change in status, stable for surgery.  I have reviewed the patient's chart and labs.  Questions were answered to the patient's satisfaction.     [name],Domonik Levario V

## 2015-03-25 NOTE — Discharge Instructions (Signed)
° °Dr. Sharlize Hoar °Total Joint Specialist °[facility] °[address] °[place], [place] [address] °[phone] ° ° °Arthroscopic Procedure, Knee °An arthroscopic procedure can find what is wrong with your knee. °PROCEDURE °Arthroscopy is a surgical technique that allows your orthopedic surgeon to diagnose and treat your knee injury with accuracy. They will look into your knee through a small instrument. This is almost like a small (pencil sized) telescope. Because arthroscopy affects your knee less than open knee surgery, you can anticipate a more rapid recovery. Taking an active role by following your caregiver's instructions will help with rapid and complete recovery. Use crutches, rest, elevation, ice, and knee exercises as instructed. The length of recovery depends on various factors including type of injury, age, physical condition, medical conditions, and your rehabilitation. °Your knee is the joint between the large bones (femur and tibia) in your leg. Cartilage covers these bone ends which are smooth and slippery and allow your knee to bend and move smoothly. Two menisci, thick, semi-lunar shaped pads of cartilage which form a rim inside the joint, help absorb shock and stabilize your knee. Ligaments bind the bones together and support your knee joint. Muscles move the joint, help support your knee, and take stress off the joint itself. Because of this all programs and physical therapy to rehabilitate an injured or repaired knee require rebuilding and strengthening your muscles. °AFTER THE PROCEDURE °· After the procedure, you will be moved to a recovery area until most of the effects of the medication have worn off. Your caregiver will discuss the test results with you.  °· Only take over-the-counter or prescription medicines for pain, discomfort, or fever as directed by your caregiver.  °SEEK MEDICAL CARE IF:  °· You have increased bleeding from your wounds.  °· You see  redness, swelling, or have increasing pain in your wounds.  °· You have pus coming from your wound.  °· You have an oral temperature above 102° F (38.9° C).  °· You notice a bad smell coming from the wound or dressing.  °· You have severe pain with any motion of your knee.  °SEEK IMMEDIATE MEDICAL CARE IF:  °· You develop a rash.  °· You have difficulty breathing.  °· You have any allergic problems.  °FURTHER INSTRUCTIONS:  °· ICE to the affected knee every three hours for 30 minutes at a time and then as needed for pain and swelling.  Continue to use ice on the knee for pain and swelling from surgery. You may notice swelling that will progress down to the foot and ankle.  This is normal after surgery.  Elevate the leg when you are not up walking on it.   ° °DIET °You may resume your previous home diet once your are discharged from the hospital. ° °DRESSING / WOUND CARE / SHOWERING °You may start showering two days after being discharged home but do not submerge the incisions under water.  °Change dressing 48 hours after the procedure and then cover the small incisions with band aids until your follow up visit. °Change the surgical dressings daily and reapply a dry dressing each time.  ° °ACTIVITY °Walk with your walker as instructed. °Use walker as long as suggested by your caregivers. °Avoid periods of inactivity such as sitting longer than an hour when not asleep. This helps prevent blood clots.  °You may resume a sexual relationship in one month or when given the OK by your doctor.  °You may return to   work once you are cleared by your doctor.  °Do not drive a car for 6 weeks or until released by you surgeon.  °Do not drive while taking narcotics. ° °WEIGHT BEARING AS TOLERATED ° °POSTOPERATIVE CONSTIPATION PROTOCOL °Constipation - defined medically as fewer than three stools per week and severe constipation as less than one stool per week. ° °One of the most common issues patients have following surgery is  constipation.  Even if you have a regular bowel pattern at home, your normal regimen is likely to be disrupted due to multiple reasons following surgery.  Combination of anesthesia, postoperative narcotics, change in appetite and fluid intake all can affect your bowels.  In order to avoid complications following surgery, here are some recommendations in order to help you during your recovery period. ° °Colace (docusate) - Pick up an over-the-counter form of Colace or another stool softener and take twice a day as long as you are requiring postoperative pain medications.  Take with a full glass of water daily.  If you experience loose stools or diarrhea, hold the colace until you stool forms back up.  If your symptoms do not get better within 1 week or if they get worse, check with your doctor. ° °Dulcolax (bisacodyl) - Pick up over-the-counter and take as directed by the product packaging as needed to assist with the movement of your bowels.  Take with a full glass of water.  Use this product as needed if not relieved by Colace only.  ° °MiraLax (polyethylene glycol) - Pick up over-the-counter to have on hand.  MiraLax is a solution that will increase the amount of water in your bowels to assist with bowel movements.  Take as directed and can mix with a glass of water, juice, soda, coffee, or tea.  Take if you go more than two days without a movement. °Do not use MiraLax more than once per day. Call your doctor if you are still constipated or irregular after using this medication for 7 days in a row. ° °If you continue to have problems with postoperative constipation, please contact the office for further assistance and recommendations.  If you experience "the worst abdominal pain ever" or develop nausea or vomiting, please contact the office immediatly for further recommendations for treatment. ° °ITCHING ° If you experience itching with your medications, try taking only a single pain pill, or even half a pain pill  at a time.  You can also use Benadryl over the counter for itching or also to help with sleep.  ° °TED HOSE STOCKINGS °Wear the elastic stockings on both legs for three weeks following surgery during the day but you may remove then at night for sleeping. ° °MEDICATIONS °See your medication summary on the “After Visit Summary” that the nursing staff will review with you prior to discharge.  You may have some home medications which will be placed on hold until you complete the course of blood thinner medication.  It is important for you to complete the blood thinner medication as prescribed by your surgeon.  Continue your approved medications as instructed at time of discharge. °Do not drive while taking narcotics.  ° °PRECAUTIONS °If you experience chest pain or shortness of breath - call 911 immediately for transfer to the hospital emergency department.  °If you develop a fever greater that 101 F, purulent drainage from wound, increased redness or drainage from wound, foul odor from the wound/dressing, or calf pain - CONTACT YOUR SURGEON.   °                                                °  FOLLOW-UP APPOINTMENTS °Make sure you keep all of your appointments after your operation with your surgeon and caregivers. You should call the office at [phone]  and make an appointment for approximately one week after the date of your surgery or on the date instructed by your surgeon outlined in the "After Visit Summary". ° °RANGE OF MOTION AND STRENGTHENING EXERCISES  °Rehabilitation of the knee is important following a knee injury or an operation. After just a few days of immobilization, the muscles of the thigh which control the knee become weakened and shrink (atrophy). Knee exercises are designed to build up the tone and strength of the thigh muscles and to improve knee motion. Often times heat used for twenty to thirty minutes before working out will loosen up your tissues and help with improving the range of motion  but do not use heat for the first two weeks following surgery. These exercises can be done on a training (exercise) mat, on the floor, on a table or on a bed. Use what ever works the best and is most comfortable for you Knee exercises include: ° °QUAD STRENGTHENING EXERCISES °Strengthening Quadriceps Sets ° °Tighten muscles on top of thigh by pushing knees down into floor or table. °Hold for 20 seconds. Repeat 10 times. °Do 2 sessions per day. ° ° ° ° °Strengthening Terminal Knee Extension ° °With knee bent over bolster, straighten knee by tightening muscle on top of thigh. Be sure to keep bottom of knee on bolster. °Hold for 20 seconds. Repeat 10 times. °Do 2 sessions per day. ° ° °Straight Leg with Bent Knee ° °Lie on back with opposite leg bent. Keep involved knee slightly bent at knee and raise leg 4-6". Hold for 10 seconds. °Repeat 20 times per set. °Do 2 sets per session. °Do 2 sessions per day. ° °

## 2015-03-25 NOTE — Anesthesia Postprocedure Evaluation (Signed)
Anesthesia Post Note  Patient: Adam BergFranklin W Georgiou Jr.  Procedure(s) Performed: Procedure(s) (LRB): RIGHT KNEE ARTHROSCOPY WITH MENISCAL DEBRIDEMENT and chrodroplasty (Right) RIGHT KNEE PREPATELLA BURSECTOMY (Right)  Patient location during evaluation: PACU Anesthesia Type: General Level of consciousness: awake and alert Pain management: pain level controlled Vital Signs Assessment: post-procedure vital signs reviewed and stable Respiratory status: spontaneous breathing, nonlabored ventilation, respiratory function stable and patient connected to nasal cannula oxygen Cardiovascular status: blood pressure returned to baseline and stable Postop Assessment: no signs of nausea or vomiting Anesthetic complications: no    Last Vitals:  Filed Vitals:   03/25/15 1709 03/25/15 1716  BP:  133/88  Pulse: 87   Temp:  36.4 C  Resp: 17 16    Last Pain:  Filed Vitals:   03/25/15 1723  PainSc: 4                  Adam Petty

## 2015-03-25 NOTE — Brief Op Note (Signed)
03/25/2015  3:47 PM  PATIENT:  Adam BergFranklin W Majerus Jr.  55 y.o. male  PRE-OPERATIVE DIAGNOSIS:  RIGHT KNEE MEDIAL MENISCUS TEAR, RIGHT KNEE PREPATELLAR BURSITIS  POST-OPERATIVE DIAGNOSIS:  RIGHT KNEE MEDIAL MENISCUS TEAR, RIGHT KNEE PREPATELLAR BURSITIS  PROCEDURE:  Procedure(s): RIGHT KNEE ARTHROSCOPY WITH MENISCAL DEBRIDEMENT and chrodroplasty (Right) RIGHT KNEE PREPATELLA BURSECTOMY (Right)  SURGEON:  Surgeon(s) and Role:    * Ollen GrossFrank Jadon Ressler, MD - Primary  PHYSICIAN ASSISTANT:   ASSISTANTS: Adam Peacerew Perkins, PA-C   ANESTHESIA:   general  EBL:  Total I/O In: -  Out: 10 [Blood:10]  BLOOD ADMINISTERED:none  DRAINS: none   LOCAL MEDICATIONS USED:  MARCAINE     COUNTS:  YES  TOURNIQUET:   Total Tourniquet Time Documented: Thigh (Right) - 4 minutes Total: Thigh (Right) - 4 minutes   DICTATION: .Other Dictation: Dictation Number [id number]  PLAN OF CARE: Discharge to home after PACU  PATIENT DISPOSITION:  PACU - hemodynamically stable.

## 2015-03-25 NOTE — Progress Notes (Signed)
Pt states he cannot lie flat during surgery.  Note placed on front of chart, called and spoke with Elnita Maxwellheryl, circulator, to give that information.  She stated understanding.  Ardyth GalAnderson, Carmin Dibartolo Ann, RN 03/25/2015

## 2015-03-25 NOTE — Transfer of Care (Signed)
Immediate Anesthesia Transfer of Care Note  Patient: Adam BergFranklin W Hunger Jr.  Procedure(s) Performed: Procedure(s): RIGHT KNEE ARTHROSCOPY WITH MENISCAL DEBRIDEMENT and chrodroplasty (Right) RIGHT KNEE PREPATELLA BURSECTOMY (Right)  Patient Location: PACU  Anesthesia Type:General  Level of Consciousness:  sedated, patient cooperative and responds to stimulation  Airway & Oxygen Therapy:Patient Spontanous Breathing and Patient connected to face mask oxgen  Post-op Assessment:  Report given to PACU RN and Post -op Vital signs reviewed and stable  Post vital signs:  Reviewed and stable  Last Vitals:  Filed Vitals:   03/25/15 1300  BP: 129/89  Pulse: 84  Temp: 36.7 C  Resp: 18    Complications: No apparent anesthesia complications

## 2015-03-25 NOTE — Anesthesia Procedure Notes (Signed)
Procedure Name: LMA Insertion Date/Time: 03/25/2015 2:56 PM Performed by: Epimenio SarinJARVELA, Chessa Barrasso R Pre-anesthesia Checklist: Patient identified, Emergency Drugs available, Suction available, Patient being monitored and Timeout performed Patient Re-evaluated:Patient Re-evaluated prior to inductionOxygen Delivery Method: Circle system utilized Preoxygenation: Pre-oxygenation with 100% oxygen Intubation Type: IV induction LMA: LMA with gastric port inserted LMA Size: 4.0 Number of attempts: 1 Dental Injury: Teeth and Oropharynx as per pre-operative assessment  Comments: Patient ramped up on blankets and pillow.

## 2015-03-26 ENCOUNTER — Encounter (HOSPITAL_COMMUNITY): Payer: Self-pay | Admitting: Orthopedic Surgery

## 2015-03-26 ENCOUNTER — Telehealth: Payer: Self-pay | Admitting: Pain Medicine

## 2015-03-26 NOTE — Telephone Encounter (Signed)
Had knee surgery yesterday.  Was prescribed oxycodone by surgeon.  Instructed patient that he may take medication as prescribed by surgeon.

## 2015-03-26 NOTE — Telephone Encounter (Signed)
Would like to speak with Kori °

## 2015-03-26 NOTE — Op Note (Signed)
NAME:  Adam Petty, Adam Petty NO.:  [id number]  MEDICAL RECORD NO.:  [id number]  LOCATION:  [facility]                         FACILITY:  [facility]  PHYSICIAN:  Ollen Gross, M.D.    DATE OF BIRTH:  Nov 13, 1960  DATE OF PROCEDURE:  03/25/2015 DATE OF DISCHARGE:  03/25/2015                              OPERATIVE REPORT   PREOPERATIVE DIAGNOSES:  Right knee medial meniscal tear and right knee prepatellar bursitis.  POSTOPERATIVE DIAGNOSES:  Right knee medial meniscal tear and right knee prepatellar bursitis plus chondral defects in all 3 compartments.  PROCEDURE:  Right knee arthroscopy, meniscal debridement and chondroplasty, and right knee prepatellar bursectomy.  SURGEON:  Ollen Gross, M.D.  ASSISTANT:  Alexzandrew L. Perkins, PA-C.  ANESTHESIA:  General.  ESTIMATED BLOOD LOSS:  Minimal.  DRAINS:  None.  TOURNIQUET TIME:  3 minutes at 300 mmHg.  COMPLICATIONS:  None.  CONDITION:  Stable to Recovery.  CLINICAL NOTE:  Adam Petty is a 55 year old male, had a work-related injury several years ago.  He injured his knee as well as his back.  We had previously scoped his knee.  He had lateral meniscal tear.  He is having significant pain and mechanical symptoms.  MRI suggested a medial tear.  He presents now for arthroscopy and debridement.  In addition, he has also had significant recurrent prepatellar bursitis and in the same setting, we plan on doing a prepatellar bursectomy.  PROCEDURE IN DETAIL:  After successful administration of general anesthetic, tourniquet was placed on the right thigh.  Right lower extremity prepped and draped in usual sterile fashion.  Standard superomedial and inferolateral incisions were made.  Inflow cannula passed superomedial, camera passed inferolateral.  Arthroscopic visualization proceeded.  Undersurface patella and trochlea both have some grade 3 cartilage changes scattered throughout.  There was a fair amount of unstable cartilage  on the surface of the trochlea.  There were multiple loose bodies floating throughout the joint.  The medial compartment was then entered and a spinal needle was used to localize the spot for the inferomedial incision.  Needles removed.  Incision made with an 11 blade and dilator placed.  We flushed the joint with fluid and then I used a shaver to remove the multiple loose bodies.  The medial compartment had a large chondral defect about 2 x 3 cm on the medial femoral condyle with unstable cartilage.  There was also a degenerative tear in the body and posterior horn in the medial meniscus. The meniscal tear was debrided back to stable base with baskets and a shaver and then sealed off with the ArthroCare.  The unstable cartilage on the surface of medial femoral condyle was shaved down to a stable cartilaginous base with the shaver.  There was no exposed bone in the medial compartment.  The cartilage was stabilized and probed and confirmed to be stable.  Intercondylar notch was visualized.  The ACL was normal.  The lateral compartment was then entered and there was evidence of a large chondral defect with unstable cartilage and a large flap on the lateral femoral condyle.  There were also grade 3 changes noted on the lateral tibial plateau.  The shaver was used to debride the unstable  cartilage and the lateral femoral condyle down to stable bony base as that cartilage flap came off bone.  We then abraded the bone to hopefully allow for formation of fibrocartilage.  This was about 1 x 2 cm area of exposed bone and a small surrounding area of debrided cartilage.  It was found to be stable.  There was lot of synovitis in suprapatellar area and the ArthroCare device was used to debride that.  The joint was again inspected.  No other tears, defects, or loose bodies were identified. The arthroscopic equipments removed from the inferior portals, which were closed with interrupted 4-0 nylon.  20  mL of 0.25% Marcaine with epinephrine was injected through the inflow cannula, then that was removed and that portal was closed with nylon.  The leg was wrapped in Esmarch, and tourniquet inflated to 300 mmHg.  A midline incision was made in the mid patella down to tibial tubercle. Skin was cut with a 10 blade through subcutaneous tissue.  There was a large amount of calcified, thickened bursal tissue present subcutaneously.  This was all debrided and excised.  It was excised back to normal tissue.  I examined superior, inferior, medial, and lateral, and all tissue looked normal.  The tourniquet was then released after 3 minutes.  Meticulous hemostasis was achieved to prevent hematoma.  10 mL of 0.25% Marcaine was injected into the subcu tissues.  Subcu was closed with interrupted 2-0 Vicryl and subcuticular running 4-0 Monocryl.  The incision was cleaned and dried.  Steri-Strips and a bulky sterile dressing applied.  He was then awakened and transported to recovery in stable condition.     Ollen GrossFrank Antara Brecheisen, M.D.     FA/MEDQ  D:  03/25/2015  T:  03/25/2015  Job:  [id number]

## 2015-06-08 ENCOUNTER — Encounter: Payer: Self-pay | Admitting: Pain Medicine

## 2015-06-08 ENCOUNTER — Ambulatory Visit: Payer: Worker's Compensation | Attending: Pain Medicine | Admitting: Pain Medicine

## 2015-06-08 VITALS — BP 121/86 | HR 80 | Temp 98.2°F | Resp 16 | Ht 66.0 in | Wt 240.0 lb

## 2015-06-08 DIAGNOSIS — M25511 Pain in right shoulder: Secondary | ICD-10-CM | POA: Insufficient documentation

## 2015-06-08 DIAGNOSIS — M25561 Pain in right knee: Secondary | ICD-10-CM | POA: Diagnosis not present

## 2015-06-08 DIAGNOSIS — M792 Neuralgia and neuritis, unspecified: Secondary | ICD-10-CM

## 2015-06-08 DIAGNOSIS — M1711 Unilateral primary osteoarthritis, right knee: Secondary | ICD-10-CM | POA: Diagnosis not present

## 2015-06-08 DIAGNOSIS — K589 Irritable bowel syndrome without diarrhea: Secondary | ICD-10-CM | POA: Diagnosis not present

## 2015-06-08 DIAGNOSIS — E785 Hyperlipidemia, unspecified: Secondary | ICD-10-CM | POA: Insufficient documentation

## 2015-06-08 DIAGNOSIS — E119 Type 2 diabetes mellitus without complications: Secondary | ICD-10-CM | POA: Diagnosis not present

## 2015-06-08 DIAGNOSIS — S83249A Other tear of medial meniscus, current injury, unspecified knee, initial encounter: Secondary | ICD-10-CM | POA: Diagnosis not present

## 2015-06-08 DIAGNOSIS — M4806 Spinal stenosis, lumbar region: Secondary | ICD-10-CM | POA: Diagnosis not present

## 2015-06-08 DIAGNOSIS — M549 Dorsalgia, unspecified: Secondary | ICD-10-CM | POA: Diagnosis present

## 2015-06-08 DIAGNOSIS — M25569 Pain in unspecified knee: Secondary | ICD-10-CM | POA: Diagnosis present

## 2015-06-08 DIAGNOSIS — S83289A Other tear of lateral meniscus, current injury, unspecified knee, initial encounter: Secondary | ICD-10-CM | POA: Diagnosis not present

## 2015-06-08 DIAGNOSIS — Z6841 Body Mass Index (BMI) 40.0 and over, adult: Secondary | ICD-10-CM | POA: Insufficient documentation

## 2015-06-08 DIAGNOSIS — Z79891 Long term (current) use of opiate analgesic: Secondary | ICD-10-CM | POA: Diagnosis not present

## 2015-06-08 DIAGNOSIS — Z981 Arthrodesis status: Secondary | ICD-10-CM | POA: Diagnosis not present

## 2015-06-08 DIAGNOSIS — M47896 Other spondylosis, lumbar region: Secondary | ICD-10-CM | POA: Insufficient documentation

## 2015-06-08 DIAGNOSIS — Z87442 Personal history of urinary calculi: Secondary | ICD-10-CM | POA: Insufficient documentation

## 2015-06-08 DIAGNOSIS — G8929 Other chronic pain: Secondary | ICD-10-CM | POA: Diagnosis not present

## 2015-06-08 DIAGNOSIS — E669 Obesity, unspecified: Secondary | ICD-10-CM | POA: Insufficient documentation

## 2015-06-08 DIAGNOSIS — X58XXXA Exposure to other specified factors, initial encounter: Secondary | ICD-10-CM | POA: Diagnosis not present

## 2015-06-08 DIAGNOSIS — M545 Low back pain: Secondary | ICD-10-CM | POA: Insufficient documentation

## 2015-06-08 MED ORDER — GABAPENTIN 600 MG PO TABS: 600.0000 mg | ORAL_TABLET | Freq: Four times a day (QID) | ORAL | Status: DC

## 2015-06-08 MED ORDER — OXYCODONE HCL 5 MG PO TABS: 5.0000 mg | ORAL_TABLET | ORAL | Status: DC | PRN

## 2015-06-08 MED ORDER — OXYCODONE HCL 5 MG PO TABS: 5.0000 mg | ORAL_TABLET | Freq: Four times a day (QID) | ORAL | Status: DC | PRN

## 2015-06-08 NOTE — Progress Notes (Signed)
Patient's Name: Adam Petty.  Patient type: Established  MRN: [id number]  Service setting: Ambulatory outpatient  DOB: 20-Feb-1960  Location: [facility]  DOS: 06/08/2015  Primary Care Physician: No PCP Per Patient  Note by: Para March A. Laban Emperor, M.D, DABA, DABAPM, DABPM, DABIPP, FIPP  Referring Physician: No ref. provider found  Specialty: Board-Certified Interventional Pain Management  Last Visit to Pain Management: 03/26/2015   Primary Reason(s) for Visit: Encounter for prescription drug management (Level of risk: moderate) CC: Back Pain and Knee Pain   HPI  Adam Petty is a 55 y.o. year old, male patient, who returns today as an established patient. 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); Discogenic syndrome, lumbar (WC injury); Osteoarthritis of spine with radiculopathy, lumbar region (WC injury); Lumbar facet syndrome (WC injury) (Right); Lumbosacral radiculopathy (WC injury); Pain in right knee (WC injury); Obesity, Class III, BMI 40-49.9 (morbid obesity) (HCC); Uncomplicated opioid dependence (HCC); Therapeutic opioid-induced constipation (OIC); Dyslipidemia; Microalbuminuria; Adiposity; Lumbar canal stenosis; Type 2 diabetes mellitus (HCC); Chronic pain (WC injury); Long term prescription opiate use; Encounter for chronic pain management; Neurogenic pain; Neuropathic pain; Musculoskeletal pain; Lumbar spondylosis; Chronic knee pain (Location of Secondary source of pain) (Right); Osteoarthritis of knee (Location of Secondary source of pain) (Right); Chronic shoulder pain (Location of Tertiary source of pain) (Right); 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 His primarily concern today is the Back Pain and Knee Pain   Pain Assessment: Self-Reported Pain Score: 3  Reported level is compatible with observation Pain Type: Chronic pain Pain Location: Back Pain Orientation: Lower, Right Pain Descriptors / Indicators: Aching, Constant Pain Frequency: Constant  The patient comes into the clinics today for pharmacological management of his chronic pain. I last saw this patient on 03/26/2015. The patient  reports that he does not use illicit drugs. His body mass index is 38.76 kg/(m^2).  Date of Last Visit: 03/12/15 (post procedure eval and med refill) Service Provided on Last Visit: Evaluation, Med Refill  Controlled Substance Pharmacotherapy Assessment & REMS (Risk Evaluation and Mitigation Strategy)  Analgesic: Oxycodone IR 5 mg 1 tablet by mouth every 6 hours when necessary for pain (20 mg/day) Pill Count: Please see the nurse's note for latest pill count information. MME/day: 30 mg/day Pharmacokinetics: Onset of action (Liberation/Absorption): Within expected pharmacological parameters Time to Peak effect (Distribution): Timing and results are as within normal expected parameters Duration of action (Metabolism/Excretion): Within normal limits for medication Pharmacodynamics: Analgesic Effect: More than 50% Activity Facilitation: Medication(s) allow patient to sit, stand, walk, and do the basic ADLs Perceived Effectiveness: Described as relatively effective, allowing for increase in activities of daily living (ADL) Side-effects or Adverse reactions: None reported Monitoring: [place] PMP: Online review of the past 19-month period conducted. Compliant with practice rules and regulations UDS Results/interpretation: The patient's last UDS was done on 03/12/2015 aching back within normal limits with no unexpected results. Medication  Assessment Form: Reviewed. Patient indicates being compliant with therapy Treatment compliance: Compliant Risk Assessment: Aberrant Behavior: None observed today Substance Use Disorder (SUD) Risk Level: No change since last visit Risk of opioid abuse or dependence: 0.7-3.0% with doses ?  36 MME/day and 6.1-26% with doses ? 120 MME/day. Opioid Risk Tool (ORT) Score: Total Score: 3 Low Risk for SUD (Score <3) Depression Scale Score: PHQ-2: PHQ-2 Total Score: 0 No depression (0) PHQ-9: PHQ-9 Total Score: 0 No depression (0-4)  Pharmacologic Plan: No change in therapy, at this time  Laboratory Chemistry  Inflammation Markers Lab Results  Component Value Date   ESRSEDRATE 9 03/12/2015   CRP 0.6 03/12/2015    Renal Function Lab Results  Component Value Date   BUN 15 03/12/2015   CREATININE 0.82 03/12/2015   GFRAA >60 03/12/2015   GFRNONAA >60 03/12/2015    Hepatic Function Lab Results  Component Value Date   AST 41 03/12/2015   ALT 55 03/12/2015   ALBUMIN 4.2 03/12/2015    Electrolytes Lab Results  Component Value Date   NA 140 03/12/2015   K 4.2 03/12/2015   CL 105 03/12/2015   CALCIUM 10.0 03/12/2015   MG 1.8 03/12/2015    Pain Modulating Vitamins No results found for: VD25OH, VD125OH2TOT, HY8657QI6, NG2952WU1, VITAMINB12  Coagulation Parameters No results found for: INR, LABPROT  Note: I personally reviewed the above data. Results made available to patient.  Recent Diagnostic Imaging  No results found.  Meds  The patient has a current medication list which includes the following prescription(s): cyanocobalamin, fluconazole, gabapentin, invokamet, methocarbamol, novolog flexpen, oxycodone, oxycodone, oxycodone, phentermine, rosuvastatin, tresiba flextouch, and victoza.  Current Outpatient Prescriptions on File Prior to Visit  Medication Sig  . INVOKAMET 50-1000 MG TABS TAKE 1 TABLET BY MOUTH 2 (TWO) TIMES DAILY.  . methocarbamol (ROBAXIN) 500 MG tablet Take  1 tablet (500 mg total) by mouth 4 (four) times daily. As needed for muscle spasm  . NOVOLOG FLEXPEN 100 UNIT/ML FlexPen Inject 0-10 Units into the skin daily as needed for high blood sugar (sliding scale). Reported on 02/24/2015  . rosuvastatin (CRESTOR) 5 MG tablet Take 5 mg by mouth 3 (three) times a week. Monday Wednesday Friday  . TRESIBA FLEXTOUCH 100 UNIT/ML SOPN INJECT 12 UNITS SUBCUTANEOUSLY ONCE DAILY.  Marland Kitchen VICTOZA 18 MG/3ML SOPN INJECT 0.3 MLS (1.8 MG TOTAL) SUBCUTANEOUSLY ONCE DAILY.   No current facility-administered medications on file prior to visit.    ROS  Constitutional: Denies any fever or chills Gastrointestinal: No reported hemesis, hematochezia, vomiting, or acute GI distress Musculoskeletal: Denies any acute onset joint swelling, redness, loss of ROM, or weakness Neurological: No reported episodes of acute onset apraxia, aphasia, dysarthria, agnosia, amnesia, paralysis, loss of coordination, or loss of consciousness  Allergies  Mr. Gillen has No Known Allergies.  PFSH  Medical:  Mr. 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 Family: family history includes COPD in his mother; Cancer in his mother; Diabetes in his father; Heart disease in his father and mother. Surgical:  has past surgical history that includes Tonsillectomy; Shoulder surgery; Palate surgery; Cervical fusion; Foot surgery; Lumbar laminectomy/decompression microdiscectomy (N/A, 02/06/2013); Knee arthroscopy (Right, 10/02/2013); Knee arthroscopy with medial menisectomy (Right, 03/25/2015); and Knee bursectomy (Right, 03/25/2015). Tobacco:  reports that he has never smoked. He does not have any smokeless tobacco history on file. Alcohol:  reports that he drinks alcohol. Drug:  reports  that he does not use illicit drugs.  Constitutional Exam  Vitals: Blood pressure 121/86, pulse 80, temperature 98.2 F (36.8 C), temperature source Oral, resp.  rate 16, height 5\' 6"  (1.676 m), weight 240 lb (108.863 kg), SpO2 99 %. General appearance: Well nourished, well developed, and well hydrated. In no acute distress Calculated BMI/Body habitus: Body mass index is 38.76 kg/(m^2).       Psych/Mental status: Alert and oriented x 3 (person, place, & time) Eyes: PERLA Respiratory: No evidence of acute respiratory distress  Cervical Spine Exam  Inspection: No masses, redness, or swelling Alignment: Symmetrical ROM: Functional: ROM is within functional limits [facility]) Stability: No instability detected Muscle strength & Tone: Functionally intact Sensory: Unimpaired Palpation: No complaints of tenderness  Upper Extremity (UE) Exam    Side: Right upper extremity  Side: Left upper extremity  Inspection: No masses, redness, swelling, or asymmetry  Inspection: No masses, redness, swelling, or asymmetry  ROM:  ROM:  Functional: ROM is within functional limits [facility])  Functional: ROM is within functional limits [facility])  Muscle strength & Tone: Functionally intact  Muscle strength & Tone: Functionally intact  Sensory: Unimpaired  Sensory: Unimpaired  Palpation: Non-contributory  Palpation: Non-contributory   Thoracic Spine Exam  Inspection: No masses, redness, or swelling Alignment: Symmetrical ROM: Functional: ROM is within functional limits [facility]) Stability: No instability detected Sensory: Unimpaired Muscle strength & Tone: Functionally intact Palpation: No complaints of tenderness  Lumbar Spine Exam  Inspection: No masses, redness, or swelling Alignment: Symmetrical ROM: Functional: Reduced REM Stability: No instability detected Muscle strength & Tone: Functionally intact Sensory: Unimpaired Palpation: Tender Provocative Tests: Lumbar Hyperextension and rotation test: Positive  for right-sided lumbar facet pain Patrick's Maneuver: deferred  Gait & Posture Assessment  Ambulation: Unassisted Gait: Unaffected Posture: WNL  Lower Extremity Exam    Side: Right lower extremity  Side: Left lower extremity  Inspection: No masses, redness, swelling, or asymmetry. Knee is wrapped after having had surgery in it. ROM:  Inspection: No masses, redness, swelling, or asymmetry ROM:  Functional: ROM is within functional limits [facility])  Functional: ROM is within functional limits [facility])  Muscle strength & Tone: Functionally intact  Muscle strength & Tone: Functionally intact  Sensory: Unimpaired  Sensory: Unimpaired  Palpation: Non-contributory  Palpation: Non-contributory   Assessment & Plan  Primary Diagnosis & Pertinent Problem List: The primary encounter diagnosis was Chronic pain. A diagnosis of Neurogenic pain was also pertinent to this visit.  Visit Diagnosis: 1. Chronic pain   2. Neurogenic pain     Problems updated and reviewed during this visit: Problem  Adiposity    Problem-specific Plan(s): No problem-specific assessment & plan notes found for this encounter.  No new assessment & plan notes have been filed under this hospital service since the last note was generated. Service: Pain Management   Plan of Care   Problem List Items Addressed This Visit      High   Chronic pain (WC injury) - Primary (Chronic)   Relevant Medications   gabapentin (NEURONTIN) 600 MG tablet   oxyCODONE (OXY IR/ROXICODONE) 5 MG immediate release tablet   oxyCODONE (OXY IR/ROXICODONE) 5 MG immediate release tablet   oxyCODONE (OXY IR/ROXICODONE) 5 MG immediate release tablet   Neurogenic pain (Chronic)   Relevant Medications   gabapentin (NEURONTIN) 600 MG tablet       Pharmacotherapy (Medications Ordered): Meds ordered this encounter  Medications  . DISCONTD: oxyCODONE (OXY IR/ROXICODONE) 5 MG immediate release tablet    Sig: Take 1-2 tablets (5-10 mg total) by  mouth every 4 (four) hours as needed for severe pain.    Dispense:  60 tablet  Refill:  0    Do not place this medication, or any other prescription from our practice, on "Automatic Refill". Patient may have prescription filled one day early if pharmacy is closed on scheduled refill date. Do not fill until: 06/17/15 To last until: 07/17/15  . gabapentin (NEURONTIN) 600 MG tablet    Sig: Take 1 tablet (600 mg total) by mouth every 6 (six) hours.    Dispense:  120 tablet    Refill:  2    Do not place this medication, or any other prescription from our practice, on "Automatic Refill". Patient may have prescription filled one day early if pharmacy is closed on scheduled refill date.  Marland Kitchen oxyCODONE (OXY IR/ROXICODONE) 5 MG immediate release tablet    Sig: Take 1 tablet (5 mg total) by mouth every 6 (six) hours as needed for severe pain.    Dispense:  120 tablet    Refill:  0    Do not place this medication, or any other prescription from our practice, on "Automatic Refill". Patient may have prescription filled one day early if pharmacy is closed on scheduled refill date. Do not fill until: 07/17/15 To last until: 08/16/15  . oxyCODONE (OXY IR/ROXICODONE) 5 MG immediate release tablet    Sig: Take 1 tablet (5 mg total) by mouth every 6 (six) hours as needed for severe pain.    Dispense:  120 tablet    Refill:  0    Do not place this medication, or any other prescription from our practice, on "Automatic Refill". Patient may have prescription filled one day early if pharmacy is closed on scheduled refill date. Do not fill until: 06/17/15 To last until: 07/17/15  . oxyCODONE (OXY IR/ROXICODONE) 5 MG immediate release tablet    Sig: Take 1 tablet (5 mg total) by mouth every 6 (six) hours as needed for severe pain.    Dispense:  120 tablet    Refill:  0    Do not place this medication, or any other prescription from our practice, on "Automatic Refill". Patient may have prescription filled one day  early if pharmacy is closed on scheduled refill date. Do not fill until: 08/16/15 To last until: 09/15/15    [facility] & Procedure Ordered: No orders of the defined types were placed in this encounter.    Imaging Ordered: None  Interventional Therapies: Scheduled:  None at this time    Considering:  Diagnostic lumbar facet block with possible follow-up with radiofrequency ablation.   PRN Procedures:  Right sided, diagnostic, lumbar facet block under fluoroscopic guidance and IV sedation.    Referral(s) or Consult(s): None at this time.  New Prescriptions   No medications on file    Medications administered during this visit: Mr. Nuno had no medications administered during this visit.  Requested PM Follow-up: Return in about 3 months (around 08/31/2015) for Medication Management, (3-Mo), Procedure (PRN - Patient will call).  Future Appointments Date Time Provider Department Center  09/08/2015 8:20 AM Delano Metz, MD [facility] None    Primary Care Physician: No PCP Per Patient Location: [facility] Note by: Sydnee Levans. Laban Emperor, M.D, DABA, DABAPM, DABPM, DABIPP, FIPP  Pain Score Disclaimer: We use the NRS-11 scale. This is a self-reported, subjective measurement of pain severity with only modest accuracy. It is used primarily to identify changes within a particular patient. It must be understood that outpatient pain scales are significantly less accurate that those used for research, where they can be applied under  ideal controlled circumstances with minimal exposure to variables. In reality, the score is likely to be a combination of pain intensity and pain affect, where pain affect describes the degree of emotional arousal or changes in action readiness caused by the sensory experience of pain. Factors such as social and work situation, setting, emotional state, anxiety levels, expectation, and prior pain experience may influence pain  perception and show large inter-individual differences that may also be affected by time variables.  Patient instructions provided during this appointment: There are no Patient Instructions on file for this visit.

## 2015-06-08 NOTE — Progress Notes (Signed)
Safety precautions to be maintained throughout the outpatient stay will include: orient to surroundings, keep bed in low position, maintain call bell within reach at all times, provide assistance with transfer out of bed and ambulation. Patient did not bring his medications today.

## 2015-09-08 ENCOUNTER — Ambulatory Visit: Payer: BLUE CROSS/BLUE SHIELD | Attending: Pain Medicine | Admitting: Pain Medicine

## 2015-09-08 ENCOUNTER — Encounter: Payer: Self-pay | Admitting: Pain Medicine

## 2015-09-08 VITALS — BP 135/67 | HR 93 | Temp 98.5°F | Resp 17 | Ht 66.0 in | Wt 240.0 lb

## 2015-09-08 DIAGNOSIS — R809 Proteinuria, unspecified: Secondary | ICD-10-CM | POA: Insufficient documentation

## 2015-09-08 DIAGNOSIS — S83289A Other tear of lateral meniscus, current injury, unspecified knee, initial encounter: Secondary | ICD-10-CM | POA: Insufficient documentation

## 2015-09-08 DIAGNOSIS — K5903 Drug induced constipation: Secondary | ICD-10-CM | POA: Insufficient documentation

## 2015-09-08 DIAGNOSIS — M5417 Radiculopathy, lumbosacral region: Secondary | ICD-10-CM | POA: Diagnosis not present

## 2015-09-08 DIAGNOSIS — Z79891 Long term (current) use of opiate analgesic: Secondary | ICD-10-CM | POA: Diagnosis not present

## 2015-09-08 DIAGNOSIS — M961 Postlaminectomy syndrome, not elsewhere classified: Secondary | ICD-10-CM

## 2015-09-08 DIAGNOSIS — M791 Myalgia: Secondary | ICD-10-CM | POA: Insufficient documentation

## 2015-09-08 DIAGNOSIS — M1711 Unilateral primary osteoarthritis, right knee: Secondary | ICD-10-CM | POA: Diagnosis not present

## 2015-09-08 DIAGNOSIS — N200 Calculus of kidney: Secondary | ICD-10-CM | POA: Diagnosis not present

## 2015-09-08 DIAGNOSIS — X58XXXA Exposure to other specified factors, initial encounter: Secondary | ICD-10-CM | POA: Insufficient documentation

## 2015-09-08 DIAGNOSIS — M792 Neuralgia and neuritis, unspecified: Secondary | ICD-10-CM

## 2015-09-08 DIAGNOSIS — Z794 Long term (current) use of insulin: Secondary | ICD-10-CM | POA: Diagnosis not present

## 2015-09-08 DIAGNOSIS — F1190 Opioid use, unspecified, uncomplicated: Secondary | ICD-10-CM

## 2015-09-08 DIAGNOSIS — M47896 Other spondylosis, lumbar region: Secondary | ICD-10-CM | POA: Diagnosis not present

## 2015-09-08 DIAGNOSIS — Z6841 Body Mass Index (BMI) 40.0 and over, adult: Secondary | ICD-10-CM | POA: Diagnosis not present

## 2015-09-08 DIAGNOSIS — E119 Type 2 diabetes mellitus without complications: Secondary | ICD-10-CM | POA: Insufficient documentation

## 2015-09-08 DIAGNOSIS — M4806 Spinal stenosis, lumbar region: Secondary | ICD-10-CM | POA: Insufficient documentation

## 2015-09-08 DIAGNOSIS — M25561 Pain in right knee: Secondary | ICD-10-CM | POA: Insufficient documentation

## 2015-09-08 DIAGNOSIS — G8929 Other chronic pain: Secondary | ICD-10-CM | POA: Diagnosis not present

## 2015-09-08 DIAGNOSIS — M539 Dorsopathy, unspecified: Secondary | ICD-10-CM | POA: Diagnosis not present

## 2015-09-08 DIAGNOSIS — M545 Low back pain: Secondary | ICD-10-CM | POA: Insufficient documentation

## 2015-09-08 DIAGNOSIS — M4726 Other spondylosis with radiculopathy, lumbar region: Secondary | ICD-10-CM | POA: Insufficient documentation

## 2015-09-08 DIAGNOSIS — E785 Hyperlipidemia, unspecified: Secondary | ICD-10-CM | POA: Diagnosis not present

## 2015-09-08 DIAGNOSIS — K589 Irritable bowel syndrome without diarrhea: Secondary | ICD-10-CM | POA: Diagnosis not present

## 2015-09-08 DIAGNOSIS — M25511 Pain in right shoulder: Secondary | ICD-10-CM | POA: Insufficient documentation

## 2015-09-08 DIAGNOSIS — M47816 Spondylosis without myelopathy or radiculopathy, lumbar region: Secondary | ICD-10-CM

## 2015-09-08 MED ORDER — OXYCODONE HCL 5 MG PO TABS: 5.0000 mg | ORAL_TABLET | Freq: Four times a day (QID) | ORAL | 0 refills | Status: DC | PRN

## 2015-09-08 MED ORDER — GABAPENTIN 600 MG PO TABS: 600.0000 mg | ORAL_TABLET | Freq: Four times a day (QID) | ORAL | 2 refills | Status: DC

## 2015-09-08 NOTE — Progress Notes (Signed)
Seen by Dr. Elmer RampVirk for sinus infection, started on antibiotics.  Pill Count: patient did not bring bottle, was reminded of the policy and instructed that he may not get medication refill today due to not complying with the medication policy. Stated that it was filled about 5 days ago.

## 2015-09-08 NOTE — Patient Instructions (Signed)
GENERAL RISKS AND COMPLICATIONS  What are the risk, side effects and possible complications? Generally speaking, most procedures are safe.  However, with any procedure there are risks, side effects, and the possibility of complications.  The risks and complications are dependent upon the sites that are lesioned, or the type of nerve block to be performed.  The closer the procedure is to the spine, the more serious the risks are.  Great care is taken when placing the radio frequency needles, block needles or lesioning probes, but sometimes complications can occur. 1. Infection: Any time there is an injection through the skin, there is a risk of infection.  This is why sterile conditions are used for these blocks.  There are four possible types of infection. 1. Localized skin infection. 2. Central Nervous System Infection-This can be in the form of Meningitis, which can be deadly. 3. Epidural Infections-This can be in the form of an epidural abscess, which can cause pressure inside of the spine, causing compression of the spinal cord with subsequent paralysis. This would require an emergency surgery to decompress, and there are no guarantees that the patient would recover from the paralysis. 4. Discitis-This is an infection of the intervertebral discs.  It occurs in about 1% of discography procedures.  It is difficult to treat and it may lead to surgery.        2. Pain: the needles have to go through skin and soft tissues, will cause soreness.       3. Damage to internal structures:  The nerves to be lesioned may be near blood vessels or    other nerves which can be potentially damaged.       4. Bleeding: Bleeding is more common if the patient is taking blood thinners such as  aspirin, Coumadin, Ticiid, Plavix, etc., or if he/she have some genetic predisposition  such as hemophilia. Bleeding into the spinal canal can cause compression of the spinal  cord with subsequent paralysis.  This would require an  emergency surgery to  decompress and there are no guarantees that the patient would recover from the  paralysis.       5. Pneumothorax:  Puncturing of a lung is a possibility, every time a needle is introduced in  the area of the chest or upper back.  Pneumothorax refers to free air around the  collapsed lung(s), inside of the thoracic cavity (chest cavity).  Another two possible  complications related to a similar event would include: Hemothorax and Chylothorax.   These are variations of the Pneumothorax, where instead of air around the collapsed  lung(s), you may have blood or chyle, respectively.       6. Spinal headaches: They may occur with any procedures in the area of the spine.       7. Persistent CSF (Cerebro-Spinal Fluid) leakage: This is a rare problem, but may occur  with prolonged intrathecal or epidural catheters either due to the formation of a fistulous  track or a dural tear.       8. Nerve damage: By working so close to the spinal cord, there is always a possibility of  nerve damage, which could be as serious as a permanent spinal cord injury with  paralysis.       9. Death:  Although rare, severe deadly allergic reactions known as "Anaphylactic  reaction" can occur to any of the medications used.      10. Worsening of the symptoms:  We can always make thing worse.    What are the chances of something like this happening? Chances of any of this occuring are extremely low.  By statistics, you have more of a chance of getting killed in a motor vehicle accident: while driving to the hospital than any of the above occurring .  Nevertheless, you should be aware that they are possibilities.  In general, it is similar to taking a shower.  Everybody knows that you can slip, hit your head and get killed.  Does that mean that you should not shower again?  Nevertheless always keep in mind that statistics do not mean anything if you happen to be on the wrong side of them.  Even if a procedure has a 1  (one) in a 1,000,000 (million) chance of going wrong, it you happen to be that one..Also, keep in mind that by statistics, you have more of a chance of having something go wrong when taking medications.  Who should not have this procedure? If you are on a blood thinning medication (e.g. Coumadin, Plavix, see list of "Blood Thinners"), or if you have an active infection going on, you should not have the procedure.  If you are taking any blood thinners, please inform your physician.  How should I prepare for this procedure?  Do not eat or drink anything at least six hours prior to the procedure.  Bring a driver with you .  It cannot be a taxi.  Come accompanied by an adult that can drive you back, and that is strong enough to help you if your legs get weak or numb from the local anesthetic.  Take all of your medicines the morning of the procedure with just enough water to swallow them.  If you have diabetes, make sure that you are scheduled to have your procedure done first thing in the morning, whenever possible.  If you have diabetes, take only half of your insulin dose and notify our nurse that you have done so as soon as you arrive at the clinic.  If you are diabetic, but only take blood sugar pills (oral hypoglycemic), then do not take them on the morning of your procedure.  You may take them after you have had the procedure.  Do not take aspirin or any aspirin-containing medications, at least eleven (11) days prior to the procedure.  They may prolong bleeding.  Wear loose fitting clothing that may be easy to take off and that you would not mind if it got stained with Betadine or blood.  Do not wear any jewelry or perfume  Remove any nail coloring.  It will interfere with some of our monitoring equipment.  NOTE: Remember that this is not meant to be interpreted as a complete list of all possible complications.  Unforeseen problems may occur.  BLOOD THINNERS The following drugs  contain aspirin or other products, which can cause increased bleeding during surgery and should not be taken for 2 weeks prior to and 1 week after surgery.  If you should need take something for relief of minor pain, you may take acetaminophen which is found in Tylenol,m Datril, Anacin-3 and Panadol. It is not blood thinner. The products listed below are.  Do not take any of the products listed below in addition to any listed on your instruction sheet.  A.P.C or A.P.C with Codeine Codeine Phosphate Capsules #3 Ibuprofen Ridaura  ABC compound Congesprin Imuran rimadil  Advil Cope Indocin Robaxisal  Alka-Seltzer Effervescent Pain Reliever and Antacid Coricidin or Coricidin-D  Indomethacin Rufen    Alka-Seltzer plus Cold Medicine Cosprin Ketoprofen S-A-C Tablets  Anacin Analgesic Tablets or Capsules Coumadin Korlgesic Salflex  Anacin Extra Strength Analgesic tablets or capsules CP-2 Tablets Lanoril Salicylate  Anaprox Cuprimine Capsules Levenox Salocol  Anexsia-D Dalteparin Magan Salsalate  Anodynos Darvon compound Magnesium Salicylate Sine-off  Ansaid Dasin Capsules Magsal Sodium Salicylate  Anturane Depen Capsules Marnal Soma  APF Arthritis pain formula Dewitt's Pills Measurin Stanback  Argesic Dia-Gesic Meclofenamic Sulfinpyrazone  Arthritis Bayer Timed Release Aspirin Diclofenac Meclomen Sulindac  Arthritis pain formula Anacin Dicumarol Medipren Supac  Analgesic (Safety coated) Arthralgen Diffunasal Mefanamic Suprofen  Arthritis Strength Bufferin Dihydrocodeine Mepro Compound Suprol  Arthropan liquid Dopirydamole Methcarbomol with Aspirin Synalgos  ASA tablets/Enseals Disalcid Micrainin Tagament  Ascriptin Doan's Midol Talwin  Ascriptin A/D Dolene Mobidin Tanderil  Ascriptin Extra Strength Dolobid Moblgesic Ticlid  Ascriptin with Codeine Doloprin or Doloprin with Codeine Momentum Tolectin  Asperbuf Duoprin Mono-gesic Trendar  Aspergum Duradyne Motrin or Motrin IB Triminicin  Aspirin  plain, buffered or enteric coated Durasal Myochrisine Trigesic  Aspirin Suppositories Easprin Nalfon Trillsate  Aspirin with Codeine Ecotrin Regular or Extra Strength Naprosyn Uracel  Atromid-S Efficin Naproxen Ursinus  Auranofin Capsules Elmiron Neocylate Vanquish  Axotal Emagrin Norgesic Verin  Azathioprine Empirin or Empirin with Codeine Normiflo Vitamin E  Azolid Emprazil Nuprin Voltaren  Bayer Aspirin plain, buffered or children's or timed BC Tablets or powders Encaprin Orgaran Warfarin Sodium  Buff-a-Comp Enoxaparin Orudis Zorpin  Buff-a-Comp with Codeine Equegesic Os-Cal-Gesic   Buffaprin Excedrin plain, buffered or Extra Strength Oxalid   Bufferin Arthritis Strength Feldene Oxphenbutazone   Bufferin plain or Extra Strength Feldene Capsules Oxycodone with Aspirin   Bufferin with Codeine Fenoprofen Fenoprofen Pabalate or Pabalate-SF   Buffets II Flogesic Panagesic   Buffinol plain or Extra Strength Florinal or Florinal with Codeine Panwarfarin   Buf-Tabs Flurbiprofen Penicillamine   Butalbital Compound Four-way cold tablets Penicillin   Butazolidin Fragmin Pepto-Bismol   Carbenicillin Geminisyn Percodan   Carna Arthritis Reliever Geopen Persantine   Carprofen Gold's salt Persistin   Chloramphenicol Goody's Phenylbutazone   Chloromycetin Haltrain Piroxlcam   Clmetidine heparin Plaquenil   Cllnoril Hyco-pap Ponstel   Clofibrate Hydroxy chloroquine Propoxyphen         Before stopping any of these medications, be sure to consult the physician who ordered them.  Some, such as Coumadin (Warfarin) are ordered to prevent or treat serious conditions such as "deep thrombosis", "pumonary embolisms", and other heart problems.  The amount of time that you may need off of the medication may also vary with the medication and the reason for which you were taking it.  If you are taking any of these medications, please make sure you notify your pain physician before you undergo any  procedures.         Facet Blocks Patient Information  Description: The facets are joints in the spine between the vertebrae.  Like any joints in the body, facets can become irritated and painful.  Arthritis can also effect the facets.  By injecting steroids and local anesthetic in and around these joints, we can temporarily block the nerve supply to them.  Steroids act directly on irritated nerves and tissues to reduce selling and inflammation which often leads to decreased pain.  Facet blocks may be done anywhere along the spine from the neck to the low back depending upon the location of your pain.   After numbing the skin with local anesthetic (like Novocaine), a small needle is passed onto the facet joints under x-ray guidance.    You may experience a sensation of pressure while this is being done.  The entire block usually lasts about 15-25 minutes.   Conditions which may be treated by facet blocks:   Low back/buttock pain  Neck/shoulder pain  Certain types of headaches  Preparation for the injection:  1. Do not eat any solid food or dairy products within 8 hours of your appointment. 2. You may drink clear liquid up to 3 hours before appointment.  Clear liquids include water, black coffee, juice or soda.  No milk or cream please. 3. You may take your regular medication, including pain medications, with a sip of water before your appointment.  Diabetics should hold regular insulin (if taken separately) and take 1/2 normal NPH dose the morning of the procedure.  Carry some sugar containing items with you to your appointment. 4. A driver must accompany you and be prepared to drive you home after your procedure. 5. Bring all your current medications with you. 6. An IV may be inserted and sedation may be given at the discretion of the physician. 7. A blood pressure cuff, EKG and other monitors will often be applied during the procedure.  Some patients may need to have extra oxygen  administered for a short period. 8. You will be asked to provide medical information, including your allergies and medications, prior to the procedure.  We must know immediately if you are taking blood thinners (like Coumadin/Warfarin) or if you are allergic to IV iodine contrast (dye).  We must know if you could possible be pregnant.  Possible side-effects:   Bleeding from needle site  Infection (rare, may require surgery)  Nerve injury (rare)  Numbness & tingling (temporary)  Difficulty urinating (rare, temporary)  Spinal headache (a headache worse with upright posture)  Light-headedness (temporary)  Pain at injection site (serveral days)  Decreased blood pressure (rare, temporary)  Weakness in arm/leg (temporary)  Pressure sensation in back/neck (temporary)   Call if you experience:   Fever/chills associated with headache or increased back/neck pain  Headache worsened by an upright position  New onset, weakness or numbness of an extremity below the injection site  Hives or difficulty breathing (go to the emergency room)  Inflammation or drainage at the injection site(s)  Severe back/neck pain greater than usual  New symptoms which are concerning to you  Please note:  Although the local anesthetic injected can often make your back or neck feel good for several hours after the injection, the pain will likely return. It takes 3-7 days for steroids to work.  You may not notice any pain relief for at least one week.  If effective, we will often do a series of 2-3 injections spaced 3-6 weeks apart to maximally decrease your pain.  After the initial series, you may be a candidate for a more permanent nerve block of the facets.  If you have any questions, please call [phone] [facility] 

## 2015-09-08 NOTE — Progress Notes (Signed)
Patient's Name: Adam BergFranklin W Matton Jr.  Patient type: Established  MRN: [id number]  Service setting: Ambulatory outpatient  DOB: 02/16/1960  Location: [facility]  DOS: 09/08/2015  Primary Care Physician: No PCP Per Patient  Note by: Dannya Pitkin A. Laban EmperorNaveira, M.D  Referring Physician: No ref. provider found  Specialty: Interventional Pain Management  Last Visit to Pain Management: 06/08/2015   Primary Reason(s) for Visit: Encounter for prescription drug management (Level of risk: moderate) CC: Back Pain (right lower)   HPI  [name] is a 55 y.o. year old, male patient, who returns today as an established patient. 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); Discogenic syndrome, lumbar (WC injury); Osteoarthritis of spine with radiculopathy, lumbar region (WC injury); Lumbar facet syndrome (WC injury) (Bilateral) (R>L); Lumbosacral radiculopathy (WC injury); Pain in right knee (WC injury); Obesity, Class III, BMI 40-49.9 (morbid obesity) (HCC); Uncomplicated opioid dependence (HCC); Therapeutic opioid-induced constipation (OIC); Dyslipidemia; Microalbuminuria; Adiposity; Lumbar canal stenosis; Type 2 diabetes mellitus (HCC); Chronic pain (WC injury); Long term prescription opiate use; Encounter for chronic pain management; Neurogenic pain; Neuropathic pain; Musculoskeletal pain; Lumbar spondylosis; Chronic knee pain (Location of Secondary source of pain) (Right); Osteoarthritis of knee (Location of Secondary source of pain) (Right); Chronic shoulder pain (Location of Tertiary source of pain) (Right); Osteoarthritis of shoulder (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 His primarily concern today is the Back Pain (right lower)   Pain Assessment: Self-Reported Pain Score: 5              Reported level is compatible with observation       Pain Type: Chronic pain Pain Location: Back Pain Orientation: Right, Lower Pain Descriptors / Indicators: Shooting, Sharp Pain Frequency: Constant  The patient comes into the clinics today for pharmacological management of his chronic pain. I last saw this patient on 06/08/2015. The patient  reports that he does not use drugs. His body mass index is 38.74 kg/m.  The patient indicates still having problems with the knee and the lower back. He has been trying to come in to have the lumbar facet blocks done which did help considerably. We'll go ahead and reschedule him for that and if he continues to do well, he may be a candidate for the radiofrequency ablation. In addition, he still having some problems with the knee pain which we can treat by way of genicular nerve blocks and if effective, we can also want to radiofrequency ablation.  Date of Last Visit: 06/08/15 Service Provided on Last Visit: Med Refill  Controlled Substance Pharmacotherapy Assessment & REMS (Risk Evaluation and Mitigation Strategy)  Analgesic: Oxycodone IR 5 mg 1 tablet by mouth every 6 hours when necessary for pain (20 mg/day) MME/day: 30 mg/day Pill Count: patient did not bring bottle, was reminded of the policy and instructed that he may not get medication refill today due to not complying with the medication policy. Stated that it was filled about 5 days ago.Final warning given to the patient. Patient reminded that this will be the last time that we fill a prescription if he does not bring the pills to be counted. Pharmacokinetics: Onset of action (  Liberation/Absorption): Within expected pharmacological parameters Time to Peak effect (Distribution): Timing and results are as within  normal expected parameters Duration of action (Metabolism/Excretion): Within normal limits for medication Pharmacodynamics: Analgesic Effect: More than 50% Activity Facilitation: Medication(s) allow patient to sit, stand, walk, and do the basic ADLs Perceived Effectiveness: Described as relatively effective, allowing for increase in activities of daily living (ADL) Side-effects or Adverse reactions: None reported Monitoring: [place] PMP: Online review of the past 70-month period conducted. Compliant with practice rules and regulations Last UDS on record: [employer]ox[employer] Select 13  Date Value Ref Range Status  03/12/2015 FINAL  Final    Comment:    ==================================================================== TOXASSURE SELECT 13 (MW) ==================================================================== Test                             Result       Flag       Units Drug Present and Declared for Prescription Verification   Oxycodone                      892          EXPECTED   ng/mg creat   Oxymorphone                    524          EXPECTED   ng/mg creat   Noroxycodone                   1363         EXPECTED   ng/mg creat   Noroxymorphone                 178          EXPECTED   ng/mg creat    Sources of oxycodone are scheduled prescription medications.    Oxymorphone, noroxycodone, and noroxymorphone are expected    metabolites of oxycodone. Oxymorphone is also available as a    scheduled prescription medication. ==================================================================== Test                      Result    Flag   Units      Ref Range   Creatinine              49               mg/dL      >=16 ==================================================================== Declared Medications:  The flagging and interpretation on this report are based on the  following declared medications.  Unexpected results may arise from  inaccuracies in the declared medications.  **Note: The testing  scope of this panel includes these medications:  Oxycodone  **Note: The testing scope of this panel does not include following  reported medications:  Bisacodyl  Canagliflozin (Invokamet)  Gabapentin  Insulin (NovoLog)  Liraglutide (Victoza)  Metformin (Invokamet)  Ramipril  Rosuvastatin  Supplement ==================================================================== For clinical consultation, please call [phone]. ====================================================================    UDS interpretation: Compliant          Medication Assessment Form: Reviewed. Patient indicates being compliant with therapy Treatment compliance: Compliant Risk Assessment: Aberrant Behavior: None observed today Substance Use Disorder (SUD) Risk Level: No change since last visit Risk of opioid abuse or dependence: 0.7-3.0% with doses ? 36 MME/day and 6.1-26% with doses ? 120 MME/day. Opioid Risk Tool (ORT) Score: Total Score: 3 Low Risk for SUD (Score <3) Depression Scale Score:  PHQ-2: PHQ-2 Total Score: 0 No depression (0) PHQ-9: PHQ-9 Total Score: 0 No depression (0-4)  Pharmacologic Plan: No change in therapy, at this time  Laboratory Chemistry  Inflammation Markers Lab Results  Component Value Date   ESRSEDRATE 9 03/12/2015   CRP 0.6 03/12/2015    Renal Function Lab Results  Component Value Date   BUN 15 03/12/2015   CREATININE 0.82 03/12/2015   GFRAA >60 03/12/2015   GFRNONAA >60 03/12/2015    Hepatic Function Lab Results  Component Value Date   AST 41 03/12/2015   ALT 55 03/12/2015   ALBUMIN 4.2 03/12/2015    Electrolytes Lab Results  Component Value Date   NA 140 03/12/2015   K 4.2 03/12/2015   CL 105 03/12/2015   CALCIUM 10.0 03/12/2015   MG 1.8 03/12/2015    Pain Modulating Vitamins No results found for: Jerry CarasVD25OH, [id number], [id number], [id number], 25OHVITD1, 25OHVITD2, 25OHVITD3, VITAMINB12  Coagulation Parameters Lab Results  Component Value Date    PLT 176 03/13/2015    Cardiovascular Lab Results  Component Value Date   HGB 15.3 03/13/2015   HCT 46.4 03/13/2015    Note: Lab results reviewed.  Recent Diagnostic Imaging  No results found.  Meds  The patient has a current medication list which includes the following prescription(s): amoxicillin-clavulanate, benzonatate, cyanocobalamin, docusate sodium, gabapentin, metformin, naproxen sodium, novolog flexpen, oxycodone, oxycodone, oxycodone, phentermine, ramipril, rosuvastatin, tresiba flextouch, victoza, and vitamin e.  Current Outpatient Prescriptions on File Prior to Visit  Medication Sig  . cyanocobalamin (,VITAMIN B-12,) 1000 MCG/ML injection Inject into the muscle every 30 (thirty) days.   Marland Kitchen. NOVOLOG FLEXPEN 100 UNIT/ML FlexPen Inject 0-10 Units into the skin daily as needed for high blood sugar (sliding scale). Reported on 02/24/2015  . phentermine 30 MG capsule Take 30 mg by mouth daily.  . rosuvastatin (CRESTOR) 5 MG tablet Take 5 mg by mouth 3 (three) times a week. Monday Wednesday Friday  . TRESIBA FLEXTOUCH 100 UNIT/ML SOPN INJECT 12 UNITS SUBCUTANEOUSLY ONCE DAILY.  Marland Kitchen. VICTOZA 18 MG/3ML SOPN INJECT 0.3 MLS (1.8 MG TOTAL) SUBCUTANEOUSLY ONCE DAILY.   No current facility-administered medications on file prior to visit.     ROS  Constitutional: Denies any fever or chills Gastrointestinal: No reported hemesis, hematochezia, vomiting, or acute GI distress Musculoskeletal: Denies any acute onset joint swelling, redness, loss of ROM, or weakness Neurological: No reported episodes of acute onset apraxia, aphasia, dysarthria, agnosia, amnesia, paralysis, loss of coordination, or loss of consciousness  Allergies  [name] has No Known Allergies.  PFSH  Medical:  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 Family: family history includes COPD in his mother; Cancer in his mother; Diabetes in his father; Heart disease in his father and mother. Surgical:  has a past surgical history that includes Tonsillectomy; Shoulder surgery; Palate surgery; Cervical fusion; Foot surgery; Lumbar laminectomy/decompression microdiscectomy (N/A, 02/06/2013); Knee arthroscopy (Right, 10/02/2013); Knee arthroscopy with medial menisectomy (Right, 03/25/2015); and Knee bursectomy (Right, 03/25/2015). Tobacco:  reports that he has never smoked. He does not have any smokeless tobacco history on file. Alcohol:  reports that he drinks alcohol. Drug:  reports that he does not use drugs.  Constitutional Exam  Vitals: Blood pressure 135/67, pulse 93, temperature 98.5 F (36.9 C), temperature source Oral, resp. rate 17, height 5\' 6"  (1.676  m), weight 240 lb (108.9 kg), SpO2 100 %. General appearance: Well nourished, well developed, and well hydrated. In no acute distress Calculated BMI/Body habitus: Body mass index is 38.74 kg/m. (35-39.9 kg/m2) Severe obesity (Class II) - 136% higher incidence of chronic pain Psych/Mental status: Alert and oriented x 3 (person, place, & time) Eyes: PERLA Respiratory: No evidence of acute respiratory distress  Cervical Spine Exam  Inspection: No masses, redness, or swelling Alignment: Symmetrical Functional ROM: ROM appears unrestricted Stability: No instability detected Muscle strength & Tone: Functionally intact Sensory: Unimpaired Palpation: Non-contributory  Upper Extremity (UE) Exam    Side: Right upper extremity  Side: Left upper extremity  Inspection: No masses, redness, swelling, or asymmetry  Inspection: No masses, redness, swelling, or asymmetry  Functional ROM: ROM appears unrestricted  Functional ROM: ROM appears unrestricted  Muscle strength & Tone: Functionally intact  Muscle strength & Tone: Functionally  intact  Sensory: Unimpaired  Sensory: Unimpaired  Palpation: Non-contributory  Palpation: Non-contributory   Thoracic Spine Exam  Inspection: No masses, redness, or swelling Alignment: Symmetrical Functional ROM: ROM appears unrestricted Stability: No instability detected Sensory: Unimpaired Muscle strength & Tone: Functionally intact Palpation: Non-contributory  Lumbar Spine Exam  Inspection: No masses, redness, or swelling Alignment: Symmetrical Functional ROM: Limited ROM Stability: No instability detected Muscle strength & Tone: Functionally intact Sensory: Movement-associated pain Palpation: Complains of area being tender to palpation Provocative Tests: Lumbar Hyperextension and rotation test: Positive bilaterally for facet joint pain. Patrick's Maneuver: evaluation deferred today              Gait & Posture Assessment  Ambulation: Unassisted Gait: Relatively normal for age and body habitus Posture: WNL   Lower Extremity Exam    Side: Right lower extremity  Side: Left lower extremity  Inspection: No masses, redness, swelling, or asymmetry  Inspection: No masses, redness, swelling, or asymmetry  Functional ROM: Decreased ROM for the knee joint   Functional ROM: Decreased ROM for the knee joint   Muscle strength & Tone: Functionally intact  Muscle strength & Tone: Functionally intact  Sensory: Movement-associated pain  Sensory: Movement-associated pain  Palpation: Non-contributory  Palpation: Non-contributory    Assessment & Plan  Primary Diagnosis & Pertinent Problem List: The primary encounter diagnosis was Chronic pain (WC injury). 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  Visit Diagnosis: 1. Chronic pain (WC injury)   2. Long term current use of opiate analgesic   3. Opiate use (30 MME/Day)   4. 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 Chronic low back pain (WC injury) (Location of Primary Source of Pain) (Bilateral) (R>L)   6. Chronic knee pain (Location of Secondary source of pain) (Right)   7. Chronic shoulder pain (Location of Tertiary source of pain) (Right)   8. Chronic pain   9. Neurogenic pain   10. Lumbar spondylosis, unspecified spinal osteoarthritis   11. Lumbar facet syndrome (WC injury) (Bilateral) (R>L)     Problems updated and reviewed during this visit: Problem  Lumbar facet syndrome (WC injury) (Bilateral) (R>L)    Problem-specific  Plan(s): No problem-specific Assessment & Plan notes found for this encounter.  No new Assessment & Plan notes have been filed under this hospital service since the last note was generated. Service: Pain Management   Plan of Care   Problem List Items Addressed This Visit      High   Chronic knee pain (Location of Secondary source of pain) (Right) (Chronic)   Chronic low back pain (WC injury) (Location of Primary Source of Pain) (Bilateral) (R>L) (Chronic)   Relevant Medications   naproxen sodium (ANAPROX) 220 MG tablet   oxyCODONE (OXY IR/ROXICODONE) 5 MG immediate release tablet   oxyCODONE (OXY IR/ROXICODONE) 5 MG immediate release tablet   oxyCODONE (OXY IR/ROXICODONE) 5 MG immediate release tablet   Chronic pain (WC injury) - Primary (Chronic)   Relevant Medications   naproxen sodium (ANAPROX) 220 MG tablet   oxyCODONE (OXY IR/ROXICODONE) 5 MG immediate release tablet   oxyCODONE (OXY IR/ROXICODONE) 5 MG immediate release tablet   oxyCODONE (OXY IR/ROXICODONE) 5 MG immediate release tablet    gabapentin (NEURONTIN) 600 MG tablet   Chronic shoulder pain (Location of Tertiary source of pain) (Right) (Chronic)   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) (Chronic)   Relevant Medications   naproxen sodium (ANAPROX) 220 MG tablet   oxyCODONE (OXY IR/ROXICODONE) 5 MG immediate release tablet   oxyCODONE (OXY IR/ROXICODONE) 5 MG immediate release tablet   oxyCODONE (OXY IR/ROXICODONE) 5 MG immediate release tablet   Lumbar facet syndrome (WC injury) (Bilateral) (R>L) (Chronic)   Relevant Medications   naproxen sodium (ANAPROX) 220 MG tablet   oxyCODONE (OXY IR/ROXICODONE) 5 MG immediate release tablet   oxyCODONE (OXY IR/ROXICODONE) 5 MG immediate release tablet   oxyCODONE (OXY IR/ROXICODONE) 5 MG immediate release tablet   Other Relevant Orders   LUMBAR FACET(MEDIAL BRANCH NERVE BLOCK) MBNB   Lumbar spondylosis (Chronic)   Relevant Medications   naproxen sodium (ANAPROX) 220 MG tablet   oxyCODONE (OXY IR/ROXICODONE) 5 MG immediate release tablet   oxyCODONE (OXY IR/ROXICODONE) 5 MG immediate release tablet   oxyCODONE (OXY IR/ROXICODONE) 5 MG immediate release tablet   Other Relevant Orders   LUMBAR FACET(MEDIAL BRANCH NERVE BLOCK) MBNB   Neurogenic pain (Chronic)   Relevant Medications   gabapentin (NEURONTIN) 600 MG tablet     Medium   Long term current use of opiate analgesic (Chronic)   Opiate use (30 MME/Day) (Chronic)    Other Visit Diagnoses   None.      Pharmacotherapy (Medications Ordered): Meds ordered this encounter  Medications  . oxyCODONE (OXY IR/ROXICODONE) 5 MG immediate release tablet    Sig: Take 1 tablet (5 mg total) by mouth every 6 (six) hours as needed for severe pain.    Dispense:  120 tablet    Refill:  0    Do not place this medication, or any other prescription from our practice, on "Automatic Refill". Patient may have prescription filled one day early if pharmacy is closed on scheduled refill date. Do not fill until: 09/15/15 To  last until: 10/15/15  . oxyCODONE (OXY IR/ROXICODONE) 5 MG immediate release tablet    Sig: Take 1 tablet (5 mg total) by mouth every 6 (six) hours as needed for severe pain.    Dispense:  120 tablet    Refill:  0    Do not place this medication, or any other prescription from our practice, on "Automatic Refill". Patient may have prescription filled one day early if pharmacy is closed on scheduled  refill date. Do not fill until: 10/15/15 To last until: 11/14/15  . oxyCODONE (OXY IR/ROXICODONE) 5 MG immediate release tablet    Sig: Take 1 tablet (5 mg total) by mouth every 6 (six) hours as needed for severe pain.    Dispense:  120 tablet    Refill:  0    Do not place this medication, or any other prescription from our practice, on "Automatic Refill". Patient may have prescription filled one day early if pharmacy is closed on scheduled refill date. Do not fill until: 11/14/15 To last until: 12/14/15  . gabapentin (NEURONTIN) 600 MG tablet    Sig: Take 1 tablet (600 mg total) by mouth every 6 (six) hours.    Dispense:  120 tablet    Refill:  2    Do not place this medication, or any other prescription from our practice, on "Automatic Refill". Patient may have prescription filled one day early if pharmacy is closed on scheduled refill date.    Lab-work & Procedure Ordered: Orders Placed This Encounter  Procedures  . LUMBAR FACET(MEDIAL BRANCH NERVE BLOCK) MBNB    Imaging Ordered: None  Interventional Therapies: Scheduled:  Diagnostic bilateral lumbar facet block under fluoroscopic guidance and IV sedation.    Considering:   Diagnostic bilateral lumbar facet block under fluoroscopic guidance and IV sedation.  Possible bilateral lumbar facet radiofrequency ablation under fluoroscopic guidance and IV sedation.  Diagnostic right knee genicular nerve block under fluoroscopic guidance, with or without sedation.  Possible right knee genicular nerve radiofrequency ablation under  fluoroscopic guidance and IV sedation, the peroneal and the results of the diagnostic injection.    PRN Procedures:   Right sided, diagnostic, lumbar facet block under fluoroscopic guidance and IV sedation.    Referral(s) or Consult(s): None at this time.  New Prescriptions   No medications on file    Medications administered during this visit: Mr. Pruiett had no medications administered during this visit.  Requested PM Follow-up: Return in 3 months (on 12/02/2015) for Med-Mgmt, In addition, (PRN) Procedure.  Future Appointments Date Time Provider Department Center  12/02/2015 8:20 AM Delano Metz, MD [facility] None    Primary Care Physician: No PCP Per Patient Location: [facility] Note by: Sydnee Levans. Laban Emperor, M.D, DABA, DABAPM, DABPM, DABIPP, FIPP  Pain Score Disclaimer: We use the NRS-11 scale. This is a self-reported, subjective measurement of pain severity with only modest accuracy. It is used primarily to identify changes within a particular patient. It must be understood that outpatient pain scales are significantly less accurate that those used for research, where they can be applied under ideal controlled circumstances with minimal exposure to variables. In reality, the score is likely to be a combination of pain intensity and pain affect, where pain affect describes the degree of emotional arousal or changes in action readiness caused by the sensory experience of pain. Factors such as social and work situation, setting, emotional state, anxiety levels, expectation, and prior pain experience may influence pain perception and show large inter-individual differences that may also be affected by time variables.  Patient instructions provided during this appointment: Patient Instructions   GENERAL RISKS AND COMPLICATIONS  What are the risk, side effects and possible complications? Generally speaking, most procedures are safe.  However, with  any procedure there are risks, side effects, and the possibility of complications.  The risks and complications are dependent upon the sites that are lesioned, or the type of nerve block to be performed.  The  closer the procedure is to the spine, the more serious the risks are.  Great care is taken when placing the radio frequency needles, block needles or lesioning probes, but sometimes complications can occur. 1. Infection: Any time there is an injection through the skin, there is a risk of infection.  This is why sterile conditions are used for these blocks.  There are four possible types of infection. 1. Localized skin infection. 2. Central Nervous System Infection-This can be in the form of Meningitis, which can be deadly. 3. Epidural Infections-This can be in the form of an epidural abscess, which can cause pressure inside of the spine, causing compression of the spinal cord with subsequent paralysis. This would require an emergency surgery to decompress, and there are no guarantees that the patient would recover from the paralysis. 4. Discitis-This is an infection of the intervertebral discs.  It occurs in about 1% of discography procedures.  It is difficult to treat and it may lead to surgery.        2. Pain: the needles have to go through skin and soft tissues, will cause soreness.       3. Damage to internal structures:  The nerves to be lesioned may be near blood vessels or    other nerves which can be potentially damaged.       4. Bleeding: Bleeding is more common if the patient is taking blood thinners such as  aspirin, Coumadin, Ticiid, Plavix, etc., or if he/she have some genetic predisposition  such as hemophilia. Bleeding into the spinal canal can cause compression of the spinal  cord with subsequent paralysis.  This would require an emergency surgery to  decompress and there are no guarantees that the patient would recover from the  paralysis.       5. Pneumothorax:  Puncturing of a lung  is a possibility, every time a needle is introduced in  the area of the chest or upper back.  Pneumothorax refers to free air around the  collapsed lung(s), inside of the thoracic cavity (chest cavity).  Another two possible  complications related to a similar event would include: Hemothorax and Chylothorax.   These are variations of the Pneumothorax, where instead of air around the collapsed  lung(s), you may have blood or chyle, respectively.       6. Spinal headaches: They may occur with any procedures in the area of the spine.       7. Persistent CSF (Cerebro-Spinal Fluid) leakage: This is a rare problem, but may occur  with prolonged intrathecal or epidural catheters either due to the formation of a fistulous  track or a dural tear.       8. Nerve damage: By working so close to the spinal cord, there is always a possibility of  nerve damage, which could be as serious as a permanent spinal cord injury with  paralysis.       9. Death:  Although rare, severe deadly allergic reactions known as "Anaphylactic  reaction" can occur to any of the medications used.      10. Worsening of the symptoms:  We can always make thing worse.  What are the chances of something like this happening? Chances of any of this occuring are extremely low.  By statistics, you have more of a chance of getting killed in a motor vehicle accident: while driving to the hospital than any of the above occurring .  Nevertheless, you should be aware that they are possibilities.  In general, it is similar to taking a shower.  Everybody knows that you can slip, hit your head and get killed.  Does that mean that you should not shower again?  Nevertheless always keep in mind that statistics do not mean anything if you happen to be on the wrong side of them.  Even if a procedure has a 1 (one) in a 1,000,000 (million) chance of going wrong, it you happen to be that one..Also, keep in mind that by statistics, you have more of a chance of having  something go wrong when taking medications.  Who should not have this procedure? If you are on a blood thinning medication (e.g. Coumadin, Plavix, see list of "Blood Thinners"), or if you have an active infection going on, you should not have the procedure.  If you are taking any blood thinners, please inform your physician.  How should I prepare for this procedure?  Do not eat or drink anything at least six hours prior to the procedure.  Bring a driver with you .  It cannot be a taxi.  Come accompanied by an adult that can drive you back, and that is strong enough to help you if your legs get weak or numb from the local anesthetic.  Take all of your medicines the morning of the procedure with just enough water to swallow them.  If you have diabetes, make sure that you are scheduled to have your procedure done first thing in the morning, whenever possible.  If you have diabetes, take only half of your insulin dose and notify our nurse that you have done so as soon as you arrive at the clinic.  If you are diabetic, but only take blood sugar pills (oral hypoglycemic), then do not take them on the morning of your procedure.  You may take them after you have had the procedure.  Do not take aspirin or any aspirin-containing medications, at least eleven (11) days prior to the procedure.  They may prolong bleeding.  Wear loose fitting clothing that may be easy to take off and that you would not mind if it got stained with Betadine or blood.  Do not wear any jewelry or perfume  Remove any nail coloring.  It will interfere with some of our monitoring equipment.  NOTE: Remember that this is not meant to be interpreted as a complete list of all possible complications.  Unforeseen problems may occur.  BLOOD THINNERS The following drugs contain aspirin or other products, which can cause increased bleeding during surgery and should not be taken for 2 weeks prior to and 1 week after surgery.  If you  should need take something for relief of minor pain, you may take acetaminophen which is found in Tylenol,m Datril, Anacin-3 and Panadol. It is not blood thinner. The products listed below are.  Do not take any of the products listed below in addition to any listed on your instruction sheet.  A.P.C or A.P.C with Codeine Codeine Phosphate Capsules #3 Ibuprofen Ridaura  ABC compound Congesprin Imuran rimadil  Advil Cope Indocin Robaxisal  Alka-Seltzer Effervescent Pain Reliever and Antacid Coricidin or Coricidin-D  Indomethacin Rufen  Alka-Seltzer plus Cold Medicine Cosprin Ketoprofen S-A-C Tablets  Anacin Analgesic Tablets or Capsules Coumadin Korlgesic Salflex  Anacin Extra Strength Analgesic tablets or capsules CP-2 Tablets Lanoril Salicylate  Anaprox Cuprimine Capsules Levenox Salocol  Anexsia-D Dalteparin Magan Salsalate  Anodynos Darvon compound Magnesium Salicylate Sine-off  Ansaid Dasin Capsules Magsal Sodium Salicylate  Anturane Depen Capsules  Marnal Soma  APF Arthritis pain formula Dewitt's Pills Measurin Stanback  Argesic Dia-Gesic Meclofenamic Sulfinpyrazone  Arthritis Bayer Timed Release Aspirin Diclofenac Meclomen Sulindac  Arthritis pain formula Anacin Dicumarol Medipren Supac  Analgesic (Safety coated) Arthralgen Diffunasal Mefanamic Suprofen  Arthritis Strength Bufferin Dihydrocodeine Mepro Compound Suprol  Arthropan liquid Dopirydamole Methcarbomol with Aspirin Synalgos  ASA tablets/Enseals Disalcid Micrainin Tagament  Ascriptin Doan's Midol Talwin  Ascriptin A/D Dolene Mobidin Tanderil  Ascriptin Extra Strength Dolobid Moblgesic Ticlid  Ascriptin with Codeine Doloprin or Doloprin with Codeine Momentum Tolectin  Asperbuf Duoprin Mono-gesic Trendar  Aspergum Duradyne Motrin or Motrin IB Triminicin  Aspirin plain, buffered or enteric coated Durasal Myochrisine Trigesic  Aspirin Suppositories Easprin Nalfon Trillsate  Aspirin with Codeine Ecotrin Regular or Extra Strength  Naprosyn Uracel  Atromid-S Efficin Naproxen Ursinus  Auranofin Capsules Elmiron Neocylate Vanquish  Axotal Emagrin Norgesic Verin  Azathioprine Empirin or Empirin with Codeine Normiflo Vitamin E  Azolid Emprazil Nuprin Voltaren  Bayer Aspirin plain, buffered or children's or timed BC Tablets or powders Encaprin Orgaran Warfarin Sodium  Buff-a-Comp Enoxaparin Orudis Zorpin  Buff-a-Comp with Codeine Equegesic Os-Cal-Gesic   Buffaprin Excedrin plain, buffered or Extra Strength Oxalid   Bufferin Arthritis Strength Feldene Oxphenbutazone   Bufferin plain or Extra Strength Feldene Capsules Oxycodone with Aspirin   Bufferin with Codeine Fenoprofen Fenoprofen Pabalate or Pabalate-SF   Buffets II Flogesic Panagesic   Buffinol plain or Extra Strength Florinal or Florinal with Codeine Panwarfarin   Buf-Tabs Flurbiprofen Penicillamine   Butalbital Compound Four-way cold tablets Penicillin   Butazolidin Fragmin Pepto-Bismol   Carbenicillin Geminisyn Percodan   Carna Arthritis Reliever Geopen Persantine   Carprofen Gold's salt Persistin   Chloramphenicol Goody's Phenylbutazone   Chloromycetin Haltrain Piroxlcam   Clmetidine heparin Plaquenil   Cllnoril Hyco-pap Ponstel   Clofibrate Hydroxy chloroquine Propoxyphen         Before stopping any of these medications, be sure to consult the physician who ordered them.  Some, such as Coumadin (Warfarin) are ordered to prevent or treat serious conditions such as "deep thrombosis", "pumonary embolisms", and other heart problems.  The amount of time that you may need off of the medication may also vary with the medication and the reason for which you were taking it.  If you are taking any of these medications, please make sure you notify your pain physician before you undergo any procedures.         Facet Blocks Patient Information  Description: The facets are joints in the spine between the vertebrae.  Like any joints in the body, facets can become  irritated and painful.  Arthritis can also effect the facets.  By injecting steroids and local anesthetic in and around these joints, we can temporarily block the nerve supply to them.  Steroids act directly on irritated nerves and tissues to reduce selling and inflammation which often leads to decreased pain.  Facet blocks may be done anywhere along the spine from the neck to the low back depending upon the location of your pain.   After numbing the skin with local anesthetic (like Novocaine), a small needle is passed onto the facet joints under x-ray guidance.  You may experience a sensation of pressure while this is being done.  The entire block usually lasts about 15-25 minutes.   Conditions which may be treated by facet blocks:   Low back/buttock pain  Neck/shoulder pain  Certain types of headaches  Preparation for the injection:  1. Do not eat any solid food  or dairy products within 8 hours of your appointment. 2. You may drink clear liquid up to 3 hours before appointment.  Clear liquids include water, black coffee, juice or soda.  No milk or cream please. 3. You may take your regular medication, including pain medications, with a sip of water before your appointment.  Diabetics should hold regular insulin (if taken separately) and take 1/2 normal NPH dose the morning of the procedure.  Carry some sugar containing items with you to your appointment. 4. A driver must accompany you and be prepared to drive you home after your procedure. 5. Bring all your current medications with you. 6. An IV may be inserted and sedation may be given at the discretion of the physician. 7. A blood pressure cuff, EKG and other monitors will often be applied during the procedure.  Some patients may need to have extra oxygen administered for a short period. 8. You will be asked to provide medical information, including your allergies and medications, prior to the procedure.  We must know immediately if you are  taking blood thinners (like Coumadin/Warfarin) or if you are allergic to IV iodine contrast (dye).  We must know if you could possible be pregnant.  Possible side-effects:   Bleeding from needle site  Infection (rare, may require surgery)  Nerve injury (rare)  Numbness & tingling (temporary)  Difficulty urinating (rare, temporary)  Spinal headache (a headache worse with upright posture)  Light-headedness (temporary)  Pain at injection site (serveral days)  Decreased blood pressure (rare, temporary)  Weakness in arm/leg (temporary)  Pressure sensation in back/neck (temporary)   Call if you experience:   Fever/chills associated with headache or increased back/neck pain  Headache worsened by an upright position  New onset, weakness or numbness of an extremity below the injection site  Hives or difficulty breathing (go to the emergency room)  Inflammation or drainage at the injection site(s)  Severe back/neck pain greater than usual  New symptoms which are concerning to you  Please note:  Although the local anesthetic injected can often make your back or neck feel good for several hours after the injection, the pain will likely return. It takes 3-7 days for steroids to work.  You may not notice any pain relief for at least one week.  If effective, we will often do a series of 2-3 injections spaced 3-6 weeks apart to maximally decrease your pain.  After the initial series, you may be a candidate for a more permanent nerve block of the facets.  If you have any questions, please call #[phone] [facility]

## 2015-09-29 ENCOUNTER — Ambulatory Visit: Payer: Worker's Compensation | Attending: Pain Medicine | Admitting: Pain Medicine

## 2015-09-29 ENCOUNTER — Encounter: Payer: Self-pay | Admitting: Pain Medicine

## 2015-09-29 VITALS — BP 131/84 | HR 76 | Temp 96.9°F | Resp 16 | Ht 66.0 in | Wt 242.0 lb

## 2015-09-29 DIAGNOSIS — M4806 Spinal stenosis, lumbar region: Secondary | ICD-10-CM | POA: Diagnosis not present

## 2015-09-29 DIAGNOSIS — Z6841 Body Mass Index (BMI) 40.0 and over, adult: Secondary | ICD-10-CM | POA: Insufficient documentation

## 2015-09-29 DIAGNOSIS — N200 Calculus of kidney: Secondary | ICD-10-CM | POA: Diagnosis not present

## 2015-09-29 DIAGNOSIS — M5417 Radiculopathy, lumbosacral region: Secondary | ICD-10-CM | POA: Diagnosis not present

## 2015-09-29 DIAGNOSIS — X58XXXA Exposure to other specified factors, initial encounter: Secondary | ICD-10-CM | POA: Diagnosis not present

## 2015-09-29 DIAGNOSIS — M5116 Intervertebral disc disorders with radiculopathy, lumbar region: Secondary | ICD-10-CM | POA: Diagnosis not present

## 2015-09-29 DIAGNOSIS — R808 Other proteinuria: Secondary | ICD-10-CM | POA: Diagnosis not present

## 2015-09-29 DIAGNOSIS — M549 Dorsalgia, unspecified: Secondary | ICD-10-CM | POA: Insufficient documentation

## 2015-09-29 DIAGNOSIS — G8929 Other chronic pain: Secondary | ICD-10-CM | POA: Insufficient documentation

## 2015-09-29 DIAGNOSIS — M1711 Unilateral primary osteoarthritis, right knee: Secondary | ICD-10-CM | POA: Insufficient documentation

## 2015-09-29 DIAGNOSIS — S83289A Other tear of lateral meniscus, current injury, unspecified knee, initial encounter: Secondary | ICD-10-CM | POA: Diagnosis not present

## 2015-09-29 DIAGNOSIS — S83249A Other tear of medial meniscus, current injury, unspecified knee, initial encounter: Secondary | ICD-10-CM | POA: Insufficient documentation

## 2015-09-29 DIAGNOSIS — M545 Low back pain: Secondary | ICD-10-CM | POA: Insufficient documentation

## 2015-09-29 DIAGNOSIS — Z79891 Long term (current) use of opiate analgesic: Secondary | ICD-10-CM | POA: Diagnosis not present

## 2015-09-29 DIAGNOSIS — M19011 Primary osteoarthritis, right shoulder: Secondary | ICD-10-CM | POA: Diagnosis not present

## 2015-09-29 DIAGNOSIS — E785 Hyperlipidemia, unspecified: Secondary | ICD-10-CM | POA: Diagnosis not present

## 2015-09-29 DIAGNOSIS — M47816 Spondylosis without myelopathy or radiculopathy, lumbar region: Secondary | ICD-10-CM

## 2015-09-29 DIAGNOSIS — E119 Type 2 diabetes mellitus without complications: Secondary | ICD-10-CM | POA: Diagnosis not present

## 2015-09-29 DIAGNOSIS — M25561 Pain in right knee: Secondary | ICD-10-CM | POA: Diagnosis present

## 2015-09-29 DIAGNOSIS — M4726 Other spondylosis with radiculopathy, lumbar region: Secondary | ICD-10-CM | POA: Diagnosis not present

## 2015-09-29 MED ORDER — LACTATED RINGERS IV SOLN: 1000.0000 mL | Freq: Once | INTRAVENOUS | Status: DC

## 2015-09-29 MED ORDER — TRIAMCINOLONE ACETONIDE 40 MG/ML IJ SUSP
40.0000 mg | Freq: Once | INTRAMUSCULAR | Status: AC
  Administered 2015-09-29: 40 mg
  Filled 2015-09-29: qty 1

## 2015-09-29 MED ORDER — ROPIVACAINE HCL 2 MG/ML IJ SOLN
9.0000 mL | Freq: Once | INTRAMUSCULAR | Status: AC
  Administered 2015-09-29: 9 mL
  Filled 2015-09-29: qty 10

## 2015-09-29 MED ORDER — MIDAZOLAM HCL 5 MG/5ML IJ SOLN
1.0000 mg | INTRAMUSCULAR | Status: DC | PRN
  Filled 2015-09-29: qty 5

## 2015-09-29 MED ORDER — FENTANYL CITRATE (PF) 100 MCG/2ML IJ SOLN
25.0000 ug | INTRAMUSCULAR | Status: DC | PRN
  Filled 2015-09-29: qty 2

## 2015-09-29 MED ORDER — LIDOCAINE HCL (PF) 1 % IJ SOLN
10.0000 mL | Freq: Once | INTRAMUSCULAR | Status: AC
  Administered 2015-09-29: 10 mL

## 2015-09-29 NOTE — Patient Instructions (Addendum)
Pain Management Discharge Instructions  General Discharge Instructions :  If you need to reach your doctor call: Monday-Friday 8:00 am - 4:00 pm at [phone] or toll free [phone].  After clinic hours [phone] to have operator reach doctor.  Bring all of your medication bottles to all your appointments in the pain clinic.  To cancel or reschedule your appointment with Pain Management please remember to call 24 hours in advance to avoid a fee.  Refer to the educational materials which you have been given on: General Risks, I had my Procedure. Discharge Instructions, Post Sedation.  Post Procedure Instructions:  The drugs you were given will stay in your system until tomorrow, so for the next 24 hours you should not drive, make any legal decisions or drink any alcoholic beverages.  You may eat anything you prefer, but it is better to start with liquids then soups and crackers, and gradually work up to solid foods.  Please notify your doctor immediately if you have any unusual bleeding, trouble breathing or pain that is not related to your normal pain.  Depending on the type of procedure that was done, some parts of your body may feel week and/or numb.  This usually clears up by tonight or the next day.  Walk with the use of an assistive device or accompanied by an adult for the 24 hours.  You may use ice on the affected area for the first 24 hours.  Put ice in a Ziploc bag and cover with a towel and place against area 15 minutes on 15 minutes off.  You may switch to heat after 24 hours.Facet Joint Block The facet joints connect the bones of the spine (vertebrae). They make it possible for you to bend, twist, and make other movements with your spine. They also prevent you from overbending, overtwisting, and making other excessive movements.  A facet joint block is a procedure where a numbing medicine (anesthetic) is injected into a facet joint. Often, a type of anti-inflammatory  medicine called a steroid is also injected. A facet joint block may be done for two reasons:   Diagnosis. A facet joint block may be done as a test to see whether neck or back pain is caused by a worn-down or infected facet joint. If the pain gets better after a facet joint block, it means the pain is probably coming from the facet joint. If the pain does not get better, it means the pain is probably not coming from the facet joint.   Therapy. A facet joint block may be done to relieve neck or back pain caused by a facet joint. A facet joint block is only done as a therapy if the pain does not improve with medicine, exercise programs, physical therapy, and other forms of pain management. LET [facility] CARE PROVIDER KNOW ABOUT:   Any allergies you have.   All medicines you are taking, including vitamins, herbs, eyedrops, and over-the-counter medicines and creams.   Previous problems you or members of your family have had with the use of anesthetics.   Any blood disorders you have had.   Other health problems you have. RISKS AND COMPLICATIONS Generally, having a facet joint block is safe. However, as with any procedure, complications can occur. Possible complications associated with having a facet joint block include:   Bleeding.   Injury to a nerve near the injection site.   Pain at the injection site.   Weakness or numbness in areas controlled by nerves near  the injection site.   Infection.   Temporary fluid retention.   Allergic reaction to anesthetics or medicines used during the procedure. BEFORE THE PROCEDURE   Follow your health care provider's instructions if you are taking dietary supplements or medicines. You may need to stop taking them or reduce your dosage.   Do not take any new dietary supplements or medicines without asking your health care provider first.   Follow your health care provider's instructions about eating and drinking before the  procedure. You may need to stop eating and drinking several hours before the procedure.   Arrange to have an adult drive you home after the procedure. PROCEDURE  You may need to remove your clothing and dress in an open-back gown so that your health care provider can access your spine.   The procedure will be done while you are lying on an X-ray table. Most of the time you will be asked to lie on your stomach, but you may be asked to lie in a different position if an injection will be made in your neck.   Special machines will be used to monitor your oxygen levels, heart rate, and blood pressure.   If an injection will be made in your neck, an intravenous (IV) tube will be inserted into one of your veins. Fluids and medicine will flow directly into your body through the IV tube.   The area over the facet joint where the injection will be made will be cleaned with an antiseptic soap. The surrounding skin will be covered with sterile drapes.   An anesthetic will be applied to your skin to make the injection area numb. You may feel a temporary stinging or burning sensation.   A video X-ray machine will be used to locate the joint. A contrast dye may be injected into the facet joint area to help with locating the joint.   When the joint is located, an anesthetic medicine will be injected into the joint through the needle.   Your health care provider will ask you whether you feel pain relief. If you do feel relief, a steroid may be injected to provide pain relief for a longer period of time. If you do not feel relief or feel only partial relief, additional injections of an anesthetic may be made in other facet joints.   The needle will be removed, the skin will be cleansed, and bandages will be applied.  AFTER THE PROCEDURE   You will be observed for 15-30 minutes before being allowed to go home. Do not drive. Have an adult drive you or take a taxi or public transportation instead.    If you feel pain relief, the pain will return in several hours or days when the anesthetic wears off.   You may feel pain relief 2-14 days after the procedure. The amount of time this relief lasts varies from person to person.   It is normal to feel some tenderness over the injected area(s) for 2 days following the procedure.   If you have diabetes, you may have a temporary increase in blood sugar.   This information is not intended to replace advice given to you by your health care provider. Make sure you discuss any questions you have with your health care provider.   Document Released: 05/25/2006 Document Revised: 01/24/2014 Document Reviewed: 10/24/2011 Elsevier Interactive Patient Education 2016 [employer] Facet Joint Block, Care After Refer to this sheet in the next few weeks. These instructions provide you with  information on caring for yourself after your procedure. Your health care provider may also give you more specific instructions. Your treatment has been planned according to current medical practices, but problems sometimes occur. Call your health care provider if you have any problems or questions after your procedure. HOME CARE INSTRUCTIONS   Keep track of the amount of pain relief you feel and how long it lasts.  Limit pain medicine within the first 4-6 hours after the procedure as directed by your health care provider.  Resume taking dietary supplements and medicines as directed by your health care provider.  You may resume your regular diet.  Do not apply heat near or over the injection site(s) for 24 hours.   Do not take a bath or soak in water (such as a pool or lake) for 24 hours.  Do not drive for 24 hours unless approved by your health care provider.  Avoid strenuous activity for 24 hours.  Remove your bandages the morning after the procedure.   If the injection site is tender, applying an ice pack may relieve some tenderness. To do  this:  Put ice in a bag.  Place a towel between your skin and the bag.  Leave the ice on for 15-20 minutes, 3-4 times a day.  Keep follow-up appointments as directed by your health care provider. SEEK MEDICAL CARE IF:   Your pain is not controlled by your medicines.   There is drainage from the injection site.   There is significant bleeding or swelling at the injection site.  You have diabetes and your blood sugar is above 180 mg/dL. SEEK IMMEDIATE MEDICAL CARE IF:   You develop a fever of 101F (38.3C) or greater.   You have worsening pain or swelling around the injection site.   You have red streaking around the injection site.   You develop severe pain that is not controlled by your medicines.   You develop a headache, stiff neck, nausea, or vomiting.   Your eyes become very sensitive to light.   You have weakness, paralysis, or tingling in your arms or legs that was not present before the procedure.   You develop difficulty urinating or breathing.    This information is not intended to replace advice given to you by your health care provider. Make sure you discuss any questions you have with your health care provider.   Document Released: 12/21/2011 Document Revised: 01/24/2014 Document Reviewed: 12/21/2011 Elsevier Interactive Patient Education 2016 [employer] Pain Management Discharge Instructions  General Discharge Instructions :  If you need to reach your doctor call: Monday-Friday 8:00 am - 4:00 pm at [phone] or toll free [phone].  After clinic hours [phone] to have operator reach doctor.  Bring all of your medication bottles to all your appointments in the pain clinic.  To cancel or reschedule your appointment with Pain Management please remember to call 24 hours in advance to avoid a fee.  Refer to the educational materials which you have been given on: General Risks, I had my Procedure. Discharge Instructions, Post  Sedation.  Post Procedure Instructions:  The drugs you were given will stay in your system until tomorrow, so for the next 24 hours you should not drive, make any legal decisions or drink any alcoholic beverages.  You may eat anything you prefer, but it is better to start with liquids then soups and crackers, and gradually work up to solid foods.  Please notify your doctor immediately if you have any unusual  bleeding, trouble breathing or pain that is not related to your normal pain.  Depending on the type of procedure that was done, some parts of your body may feel week and/or numb.  This usually clears up by tonight or the next day.  Walk with the use of an assistive device or accompanied by an adult for the 24 hours.  You may use ice on the affected area for the first 24 hours.  Put ice in a Ziploc bag and cover with a towel and place against area 15 minutes on 15 minutes off.  You may switch to heat after 24 hours.GENERAL RISKS AND COMPLICATIONS  What are the risk, side effects and possible complications? Generally speaking, most procedures are safe.  However, with any procedure there are risks, side effects, and the possibility of complications.  The risks and complications are dependent upon the sites that are lesioned, or the type of nerve block to be performed.  The closer the procedure is to the spine, the more serious the risks are.  Great care is taken when placing the radio frequency needles, block needles or lesioning probes, but sometimes complications can occur. 1. Infection: Any time there is an injection through the skin, there is a risk of infection.  This is why sterile conditions are used for these blocks.  There are four possible types of infection. 1. Localized skin infection. 2. Central Nervous System Infection-This can be in the form of Meningitis, which can be deadly. 3. Epidural Infections-This can be in the form of an epidural abscess, which can cause pressure inside  of the spine, causing compression of the spinal cord with subsequent paralysis. This would require an emergency surgery to decompress, and there are no guarantees that the patient would recover from the paralysis. 4. Discitis-This is an infection of the intervertebral discs.  It occurs in about 1% of discography procedures.  It is difficult to treat and it may lead to surgery.        2. Pain: the needles have to go through skin and soft tissues, will cause soreness.       3. Damage to internal structures:  The nerves to be lesioned may be near blood vessels or    other nerves which can be potentially damaged.       4. Bleeding: Bleeding is more common if the patient is taking blood thinners such as  aspirin, Coumadin, Ticiid, Plavix, etc., or if he/she have some genetic predisposition  such as hemophilia. Bleeding into the spinal canal can cause compression of the spinal  cord with subsequent paralysis.  This would require an emergency surgery to  decompress and there are no guarantees that the patient would recover from the  paralysis.       5. Pneumothorax:  Puncturing of a lung is a possibility, every time a needle is introduced in  the area of the chest or upper back.  Pneumothorax refers to free air around the  collapsed lung(s), inside of the thoracic cavity (chest cavity).  Another two possible  complications related to a similar event would include: Hemothorax and Chylothorax.   These are variations of the Pneumothorax, where instead of air around the collapsed  lung(s), you may have blood or chyle, respectively.       6. Spinal headaches: They may occur with any procedures in the area of the spine.       7. Persistent CSF (Cerebro-Spinal Fluid) leakage: This is a rare problem, but may occur  with prolonged intrathecal or epidural catheters either due to the formation of a fistulous  track or a dural tear.       8. Nerve damage: By working so close to the spinal cord, there is always a possibility  of  nerve damage, which could be as serious as a permanent spinal cord injury with  paralysis.       9. Death:  Although rare, severe deadly allergic reactions known as "Anaphylactic  reaction" can occur to any of the medications used.      10. Worsening of the symptoms:  We can always make thing worse.  What are the chances of something like this happening? Chances of any of this occuring are extremely low.  By statistics, you have more of a chance of getting killed in a motor vehicle accident: while driving to the hospital than any of the above occurring .  Nevertheless, you should be aware that they are possibilities.  In general, it is similar to taking a shower.  Everybody knows that you can slip, hit your head and get killed.  Does that mean that you should not shower again?  Nevertheless always keep in mind that statistics do not mean anything if you happen to be on the wrong side of them.  Even if a procedure has a 1 (one) in a 1,000,000 (million) chance of going wrong, it you happen to be that one..Also, keep in mind that by statistics, you have more of a chance of having something go wrong when taking medications.  Who should not have this procedure? If you are on a blood thinning medication (e.g. Coumadin, Plavix, see list of "Blood Thinners"), or if you have an active infection going on, you should not have the procedure.  If you are taking any blood thinners, please inform your physician.  How should I prepare for this procedure?  Do not eat or drink anything at least six hours prior to the procedure.  Bring a driver with you .  It cannot be a taxi.  Come accompanied by an adult that can drive you back, and that is strong enough to help you if your legs get weak or numb from the local anesthetic.  Take all of your medicines the morning of the procedure with just enough water to swallow them.  If you have diabetes, make sure that you are scheduled to have your procedure done first  thing in the morning, whenever possible.  If you have diabetes, take only half of your insulin dose and notify our nurse that you have done so as soon as you arrive at the clinic.  If you are diabetic, but only take blood sugar pills (oral hypoglycemic), then do not take them on the morning of your procedure.  You may take them after you have had the procedure.  Do not take aspirin or any aspirin-containing medications, at least eleven (11) days prior to the procedure.  They may prolong bleeding.  Wear loose fitting clothing that may be easy to take off and that you would not mind if it got stained with Betadine or blood.  Do not wear any jewelry or perfume  Remove any nail coloring.  It will interfere with some of our monitoring equipment.  NOTE: Remember that this is not meant to be interpreted as a complete list of all possible complications.  Unforeseen problems may occur.  BLOOD THINNERS The following drugs contain aspirin or other products, which can cause increased bleeding during surgery  and should not be taken for 2 weeks prior to and 1 week after surgery.  If you should need take something for relief of minor pain, you may take acetaminophen which is found in Tylenol,m Datril, Anacin-3 and Panadol. It is not blood thinner. The products listed below are.  Do not take any of the products listed below in addition to any listed on your instruction sheet.  A.P.C or A.P.C with Codeine Codeine Phosphate Capsules #3 Ibuprofen Ridaura  ABC compound Congesprin Imuran rimadil  Advil Cope Indocin Robaxisal  Alka-Seltzer Effervescent Pain Reliever and Antacid Coricidin or Coricidin-D  Indomethacin Rufen  Alka-Seltzer plus Cold Medicine Cosprin Ketoprofen S-A-C Tablets  Anacin Analgesic Tablets or Capsules Coumadin Korlgesic Salflex  Anacin Extra Strength Analgesic tablets or capsules CP-2 Tablets Lanoril Salicylate  Anaprox Cuprimine Capsules Levenox Salocol  Anexsia-D Dalteparin Magan  Salsalate  Anodynos Darvon compound Magnesium Salicylate Sine-off  Ansaid Dasin Capsules Magsal Sodium Salicylate  Anturane Depen Capsules Marnal Soma  APF Arthritis pain formula Dewitt's Pills Measurin Stanback  Argesic Dia-Gesic Meclofenamic Sulfinpyrazone  Arthritis Bayer Timed Release Aspirin Diclofenac Meclomen Sulindac  Arthritis pain formula Anacin Dicumarol Medipren Supac  Analgesic (Safety coated) Arthralgen Diffunasal Mefanamic Suprofen  Arthritis Strength Bufferin Dihydrocodeine Mepro Compound Suprol  Arthropan liquid Dopirydamole Methcarbomol with Aspirin Synalgos  ASA tablets/Enseals Disalcid Micrainin Tagament  Ascriptin Doan's Midol Talwin  Ascriptin A/D Dolene Mobidin Tanderil  Ascriptin Extra Strength Dolobid Moblgesic Ticlid  Ascriptin with Codeine Doloprin or Doloprin with Codeine Momentum Tolectin  Asperbuf Duoprin Mono-gesic Trendar  Aspergum Duradyne Motrin or Motrin IB Triminicin  Aspirin plain, buffered or enteric coated Durasal Myochrisine Trigesic  Aspirin Suppositories Easprin Nalfon Trillsate  Aspirin with Codeine Ecotrin Regular or Extra Strength Naprosyn Uracel  Atromid-S Efficin Naproxen Ursinus  Auranofin Capsules Elmiron Neocylate Vanquish  Axotal Emagrin Norgesic Verin  Azathioprine Empirin or Empirin with Codeine Normiflo Vitamin E  Azolid Emprazil Nuprin Voltaren  Bayer Aspirin plain, buffered or children's or timed BC Tablets or powders Encaprin Orgaran Warfarin Sodium  Buff-a-Comp Enoxaparin Orudis Zorpin  Buff-a-Comp with Codeine Equegesic Os-Cal-Gesic   Buffaprin Excedrin plain, buffered or Extra Strength Oxalid   Bufferin Arthritis Strength Feldene Oxphenbutazone   Bufferin plain or Extra Strength Feldene Capsules Oxycodone with Aspirin   Bufferin with Codeine Fenoprofen Fenoprofen Pabalate or Pabalate-SF   Buffets II Flogesic Panagesic   Buffinol plain or Extra Strength Florinal or Florinal with Codeine Panwarfarin   Buf-Tabs Flurbiprofen  Penicillamine   Butalbital Compound Four-way cold tablets Penicillin   Butazolidin Fragmin Pepto-Bismol   Carbenicillin Geminisyn Percodan   Carna Arthritis Reliever Geopen Persantine   Carprofen Gold's salt Persistin   Chloramphenicol Goody's Phenylbutazone   Chloromycetin Haltrain Piroxlcam   Clmetidine heparin Plaquenil   Cllnoril Hyco-pap Ponstel   Clofibrate Hydroxy chloroquine Propoxyphen         Before stopping any of these medications, be sure to consult the physician who ordered them.  Some, such as Coumadin (Warfarin) are ordered to prevent or treat serious conditions such as "deep thrombosis", "pumonary embolisms", and other heart problems.  The amount of time that you may need off of the medication may also vary with the medication and the reason for which you were taking it.  If you are taking any of these medications, please make sure you notify your pain physician before you undergo any procedures.

## 2015-09-29 NOTE — Progress Notes (Signed)
Patient's Name: Adam Petty.  MRN: [id number]  Referring Provider: Milinda Pointer, MD  DOB: 02/15/1960  PCP: No PCP Per Patient  DOS: 09/29/2015  Note by: Kathlen Brunswick. Dossie Arbour, MD  Service setting: Ambulatory outpatient  Location: [facility] (AMB) Pain Management Facility  Visit type: Procedure  Specialty: Interventional Pain Management  Patient type: Established   Primary Reason for Visit: Interventional Pain Management Treatment. CC: Back Pain (low) and Knee Pain (right)  Procedure:  Anesthesia, Analgesia, Anxiolysis:  Type: Diagnostic Medial Branch Facet Block Region: Lumbar Level: L2, L3, L4, L5, & S1 Medial Branch Level(s) Laterality: Bilateral    Type: Moderate (Conscious) Sedation & Local Anesthesia Local Anesthetic: Lidocaine 1% Route: Intravenous (IV) IV Access: Secured Sedation: Meaningful verbal contact was maintained at all times during the procedure  Indication(s): Analgesia & Anxiolysis   Indications: 1. Lumbar facet syndrome (WC injury) (Bilateral) (R>L)   2. Lumbar spondylosis, unspecified spinal osteoarthritis   3. Chronic low back pain (WC injury) (Location of Primary Source of Pain) (Bilateral) (R>L)     Pre-procedure Pain Score: 7/10 Post-procedure Pain Score: 0-No pain/10  Pre-Procedure Assessment:  Adam Petty is a 55 y.o. year old, male patient, seen today for interventional treatment. 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); Discogenic syndrome, lumbar (WC injury); Osteoarthritis of spine with radiculopathy, lumbar region (WC injury); Lumbar facet syndrome (WC injury) (Bilateral) (R>L); Lumbosacral radiculopathy (WC injury); Pain in right knee (WC injury); Obesity, Class III, BMI  40-49.9 (morbid obesity) ([place]); Uncomplicated opioid dependence ([place]); Therapeutic opioid-induced constipation (OIC); Dyslipidemia; Microalbuminuria; Adiposity; Lumbar canal stenosis; Type 2 diabetes mellitus ([place]); Chronic pain (WC injury); Long term prescription opiate use; Encounter for chronic pain management; Neurogenic pain; Neuropathic pain; Musculoskeletal pain; Lumbar spondylosis; Chronic knee pain (Location of Secondary source of pain) (Right); Osteoarthritis of knee (Location of Secondary source of pain) (Right); Chronic shoulder pain (Location of Tertiary source of pain) (Right); 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 His primarily concern today is the Back Pain (low) and Knee Pain (right)   Pain Type: Chronic pain Pain Location: Back Pain Orientation: Lower Pain Descriptors / Indicators: Sharp, Shooting Pain Frequency: Constant  Date of Last Visit: 09/08/15 Service Provided on Last Visit: Med Refill  Coagulation Parameters Lab Results  Component Value Date   PLT 176 03/13/2015    Verification of the correct person, correct site (including marking of site), and correct procedure were performed and confirmed by the patient.  Consent: Secured. Under the influence of no sedatives a written informed consent was obtained, after having provided information on the risks and possible complications. To fulfill our ethical and legal obligations, as recommended by the American Medical Association's Code of Ethics, we have provided information to the patient about our clinical impression; the nature and purpose of the treatment or procedure; the risks, benefits, and possible complications of the intervention; alternatives; the risk(s) and benefit(s) of the alternative treatment(s) or procedure(s); and the risk(s) and benefit(s) of doing nothing.  The patient was provided information about the risks and possible complications associated with the procedure. These include, but are not limited to, failure to achieve desired goals, infection, bleeding,  organ or nerve damage, allergic reactions, paralysis, and death. In the case of spinal procedures these may include, but are not limited to, failure to achieve desired goals, infection, bleeding, organ or nerve damage, allergic reactions, paralysis, and death. In addition, the patient was informed that Medicine is not an exact science; therefore, there is also the possibility of unforeseen risks and possible complications that may result in a catastrophic outcome. The patient indicated having understood very clearly. We have given the patient no guarantees and we have made no promises. Enough time was given to the patient to ask questions, all of which were answered to the patient's satisfaction.  Consent Attestation: I, the ordering provider, attest that I have discussed with the patient the benefits, risks, side-effects, alternatives, likelihood of achieving goals, and potential problems during recovery for the procedure that I have provided informed consent.  Pre-Procedure Preparation: Safety Precautions: Allergies reviewed. Appropriate site, procedure, and patient were confirmed by following the Joint Commission's Universal Protocol (UP.01.01.01), in the form of a "Time Out". The patient was asked to confirm marked site and procedure, before commencing. The patient was asked about blood thinners, or active infections, both of which were denied. Patient was assessed for positional comfort and all pressure points were checked before starting procedure. Infection Control Precautions: Sterile technique used. Standard Universal Precautions were taken as recommended by the Department of [facility] for Disease Control and Prevention (CDC). Standard pre-surgical skin prep was conducted. Respiratory  hygiene and cough etiquette was practiced. Hand hygiene observed. Safe injection practices and needle disposal techniques followed. SDV (single dose vial) medications used. Medications properly checked for expiration dates and contaminants. Personal protective equipment (PPE) used: Surgical mask. Sterile Radiation-resistant gloves. Monitoring:  As per clinic protocol. Vitals:   09/29/15 1027 09/29/15 1037 09/29/15 1047 09/29/15 1050  BP: 127/81 119/83 108/63 131/84  Pulse: 75 74 76 76  Resp: '16 15 14 16  ' Temp:  (!) 96.9 F (36.1 C)    SpO2: 98% 96% 96% 96%  Weight:      Height:      Calculated BMI: Body mass index is 39.06 kg/m. Allergies: He has No Known Allergies.. Allergy Precautions: None required  Description of Procedure Process:   Time-out: "Time-out" completed before starting procedure, as per protocol. Position: Prone Target Area: For Lumbar Facet blocks, the target is the groove formed by the junction of the transverse process and superior articular process. For the L5 dorsal ramus, the target is the notch between superior articular process and sacral ala. For the S1 dorsal ramus, the target is the superior and lateral edge of the posterior S1 Sacral foramen. Approach: Paramedial approach. Area Prepped: Entire Posterior Lumbosacral Region Prepping solution: ChloraPrep (2% chlorhexidine gluconate and 70% isopropyl alcohol) Safety Precautions: Aspiration looking for blood return was conducted prior to all injections. At no point did we inject any substances, as a needle was being advanced. No attempts were made at seeking any paresthesias. Safe injection practices and needle disposal techniques used. Medications properly checked for expiration dates. SDV (single dose vial) medications used. Description of the Procedure: Protocol guidelines were followed. The patient was placed in position over the fluoroscopy table. The target area was identified and the area prepped in the usual  manner. Skin desensitized using vapocoolant spray. Skin & deeper tissues infiltrated with local anesthetic. Appropriate amount of time allowed to pass for local anesthetics to take effect. The procedure needle was introduced through the skin, ipsilateral to the reported pain, and advanced  to the target area. Employing the "Medial Branch Technique", the needles were advanced to the angle made by the superior and medial portion of the transverse process, and the lateral and inferior portion of the superior articulating process of the targeted vertebral bodies. This area is known as "Burton's Eye" or the "Eye of the [place] Dog". A procedure needle was introduced through the skin, and this time advanced to the angle made by the superior and medial border of the sacral ala, and the lateral border of the S1 vertebral body. This last needle was later repositioned at the superior and lateral border of the posterior S1 foramen. Negative aspiration confirmed. Solution injected in intermittent fashion, asking for systemic symptoms every 0.5cc of injectate. The needles were then removed and the area cleansed, making sure to leave some of the prepping solution back to take advantage of its long term bactericidal properties. EBL: None Materials & Medications Used:  Needle(s) Used: 22g - 3.5" Spinal Needle(s)  Imaging Guidance:   Type of Imaging Technique: Fluoroscopy Guidance (Spinal) Indication(s): Assistance in needle guidance and placement for procedures requiring needle placement in or near specific anatomical locations not easily accessible without such assistance. Exposure Time: Please see nurses notes. Contrast: None required. Fluoroscopic Guidance: I was personally present in the fluoroscopy suite, where the patient was placed in position for the procedure, over the fluoroscopy-compatible table. Fluoroscopy was manipulated, using "Tunnel Vision Technique", to obtain the best possible view of the target area,  on the affected side. Parallax error was corrected before commencing the procedure. A "direction-depth-direction" technique was used to introduce the needle under continuous pulsed fluoroscopic guidance. Once the target was reached, antero-posterior, oblique, and lateral fluoroscopic projection views were taken to confirm needle placement in all planes. Permanently recorded images stored by scanning into EMR. Interpretation: Intraoperative imaging interpretation by performing Physician. Adequate needle placement confirmed. Adequate needle placement confirmed in AP, lateral, & Oblique Views. No contrast injected.  Antibiotic Prophylaxis:  Indication(s): No indications identified. Type:  Antibiotics Given (last 72 hours)    None       Post-operative Assessment:   Complications: No immediate post-treatment complications were observed. Disposition: Return to clinic for follow-up evaluation. The patient tolerated the entire procedure well. A repeat set of vitals were taken after the procedure and the patient was kept under observation following institutional policy, for this procedure. Post-procedural neurological assessment was performed, showing return to baseline, prior to discharge. The patient was discharged home, once institutional criteria were met. The patient was provided with post-procedure discharge instructions, including a section on how to identify potential problems. Should any problems arise concerning this procedure, the patient was given instructions to immediately contact us, at any time, without hesitation. In any case, we plan to contact the patient by telephone for a follow-up status report regarding this interventional procedure. Comments:  No additional relevant information.  Plan of Care   Problem List Items Addressed This Visit      High   Chronic low back pain (WC injury) (Location of Primary Source of Pain) (Bilateral) (R>L) (Chronic)   Relevant Medications   fentaNYL  (SUBLIMAZE) injection 25-50 mcg   triamcinolone acetonide (KENALOG-40) injection 40 mg (Completed)   Lumbar facet syndrome (WC injury) (Bilateral) (R>L) - Primary (Chronic)   Relevant Medications   fentaNYL (SUBLIMAZE) injection 25-50 mcg   lactated ringers infusion 1,000 mL   midazolam (VERSED) 5 MG/5ML injection 1-2 mg   triamcinolone acetonide (KENALOG-40) injection 40 mg (Completed)   lidocaine (PF) (  XYLOCAINE) 1 % injection 10 mL (Completed)   ropivacaine (PF) 2 mg/ml (0.2%) (NAROPIN) epidural 9 mL (Completed)   Other Relevant Orders   Informed Consent Details: Transcribe to consent form and obtain patient signature   Informed Consent Details: Transcribe to consent form and obtain patient signature   Lumbar spondylosis (Chronic)   Relevant Medications   fentaNYL (SUBLIMAZE) injection 25-50 mcg   triamcinolone acetonide (KENALOG-40) injection 40 mg (Completed)    Other Visit Diagnoses   None.     Requested PM Follow-up: Return in about 2 weeks (around 10/13/2015) for Post-Procedure evaluation.  Future Appointments Date Time Provider [place]  10/29/2015 9:40 AM Milinda Pointer, MD [facility] None  12/02/2015 8:20 AM Milinda Pointer, MD [facility] None    Primary Care Physician: No PCP Per Patient Location: [facility] Note by: Kathlen Brunswick. Dossie Arbour, M.D, DABA, DABAPM, DABPM, DABIPP, FIPP   Illustration of the posterior view of the lumbar spine and the posterior neural structures. Laminae of L2 through S1 are labeled. DPRL5, dorsal primary ramus of L5; DPRS1, dorsal primary ramus of S1; DPR3, dorsal primary ramus of L3; FJ, facet (zygapophyseal) joint L3-L4; I, inferior articular process of L4; LB1, lateral branch of dorsal primary ramus of L1; IAB, inferior articular branches from L3 medial branch (supplies L4-L5 facet joint); IBP, intermediate branch plexus; MB3, medial branch of dorsal primary ramus of L3; NR3, third lumbar nerve root;  S, superior articular process of L5; SAB, superior articular branches from L4 (supplies L4-5 facet joint also); TP3, transverse process of L3.  Disclaimer:  Medicine is not an Chief Strategy Officer. The only guarantee in medicine is that nothing is guaranteed. It is important to note that the decision to proceed with this intervention was based on the information collected from the patient. The Data and conclusions were drawn from the patient's questionnaire, the interview, and the physical examination. Because the information was provided in large part by the patient, it cannot be guaranteed that it has not been purposely or unconsciously manipulated. Every effort has been made to obtain as much relevant data as possible for this evaluation. It is important to note that the conclusions that lead to this procedure are derived in large part from the available data. Always take into account that the treatment will also be dependent on availability of resources and existing treatment guidelines, considered by other Pain Management Practitioners as being common knowledge and practice, at the time of the intervention. For Medico-Legal purposes, it is also important to point out that variation in procedural techniques and pharmacological choices are the acceptable norm. The indications, contraindications, technique, and results of the above procedure should only be interpreted and judged by a Board-Certified Interventional Pain Specialist with extensive familiarity and expertise in the same exact procedure and technique. Attempts at providing opinions without similar or greater experience and expertise than that of the treating physician will be considered as inappropriate and unethical, and shall result in a formal complaint to the state medical board and applicable specialty societies.

## 2015-09-29 NOTE — Progress Notes (Signed)
[id number] Versed 2 mgIV, Fentanyl 50 mcg IV 1007 Versed 2 mf IV.

## 2015-09-30 ENCOUNTER — Telehealth: Payer: Self-pay | Admitting: *Deleted

## 2015-09-30 NOTE — Telephone Encounter (Signed)
No problems post procedure. 

## 2015-10-29 ENCOUNTER — Telehealth: Payer: Self-pay

## 2015-10-29 ENCOUNTER — Ambulatory Visit: Payer: Worker's Compensation | Admitting: Pain Medicine

## 2015-10-29 NOTE — Telephone Encounter (Signed)
Patient would like for Kori to call him please.

## 2015-10-30 NOTE — Telephone Encounter (Signed)
Patient called stating he was having kidney stones and was given Nucynta by his urologist.  Was questioning whether or not he should get it filled. Instructed patient that it was Ok to get it filled but to only take it for the kidney stone.  Instructed patient not to take more of the oxycodone than is prescribed. Will discuss with Dr Laban EmperorNaveira and if there are any further instructions, I will give him a call.

## 2015-10-30 NOTE — Telephone Encounter (Signed)
Returned patient call.  Left message.

## 2015-11-03 ENCOUNTER — Telehealth: Payer: Self-pay | Admitting: *Deleted

## 2015-11-03 NOTE — Telephone Encounter (Signed)
Please put on call list for cancellations for med refill per V. Damian LeavellNeese rn

## 2015-11-03 NOTE — Telephone Encounter (Signed)
Patient states that he is going on vacation which has been scheduled for awhile, he just didn't realize that appointment was at the same time.  He states he cant cancel his vacation, but cant go without his meds either.  Patient states he is available  For the most part to come in if a patient cancels.  Will route to Chip BoerVicki to see if there are any appointments that she may be able to fit him in.

## 2015-11-04 ENCOUNTER — Encounter: Payer: Self-pay | Admitting: Pain Medicine

## 2015-11-04 ENCOUNTER — Ambulatory Visit: Payer: BLUE CROSS/BLUE SHIELD | Attending: Pain Medicine | Admitting: Pain Medicine

## 2015-11-04 VITALS — BP 132/70 | HR 110 | Temp 98.1°F | Resp 14 | Ht 66.0 in | Wt 237.0 lb

## 2015-11-04 DIAGNOSIS — M47816 Spondylosis without myelopathy or radiculopathy, lumbar region: Secondary | ICD-10-CM

## 2015-11-04 DIAGNOSIS — M792 Neuralgia and neuritis, unspecified: Secondary | ICD-10-CM | POA: Diagnosis not present

## 2015-11-04 DIAGNOSIS — M25561 Pain in right knee: Secondary | ICD-10-CM | POA: Insufficient documentation

## 2015-11-04 DIAGNOSIS — Z981 Arthrodesis status: Secondary | ICD-10-CM | POA: Insufficient documentation

## 2015-11-04 DIAGNOSIS — Z809 Family history of malignant neoplasm, unspecified: Secondary | ICD-10-CM | POA: Diagnosis not present

## 2015-11-04 DIAGNOSIS — M545 Low back pain: Secondary | ICD-10-CM

## 2015-11-04 DIAGNOSIS — Z6841 Body Mass Index (BMI) 40.0 and over, adult: Secondary | ICD-10-CM | POA: Diagnosis not present

## 2015-11-04 DIAGNOSIS — K589 Irritable bowel syndrome without diarrhea: Secondary | ICD-10-CM | POA: Insufficient documentation

## 2015-11-04 DIAGNOSIS — G894 Chronic pain syndrome: Secondary | ICD-10-CM | POA: Diagnosis not present

## 2015-11-04 DIAGNOSIS — G8929 Other chronic pain: Secondary | ICD-10-CM

## 2015-11-04 DIAGNOSIS — Z794 Long term (current) use of insulin: Secondary | ICD-10-CM | POA: Diagnosis not present

## 2015-11-04 DIAGNOSIS — M1288 Other specific arthropathies, not elsewhere classified, other specified site: Secondary | ICD-10-CM | POA: Diagnosis not present

## 2015-11-04 DIAGNOSIS — Z79891 Long term (current) use of opiate analgesic: Secondary | ICD-10-CM | POA: Diagnosis not present

## 2015-11-04 DIAGNOSIS — Z8249 Family history of ischemic heart disease and other diseases of the circulatory system: Secondary | ICD-10-CM | POA: Diagnosis not present

## 2015-11-04 DIAGNOSIS — M48061 Spinal stenosis, lumbar region without neurogenic claudication: Secondary | ICD-10-CM | POA: Diagnosis not present

## 2015-11-04 DIAGNOSIS — E785 Hyperlipidemia, unspecified: Secondary | ICD-10-CM | POA: Insufficient documentation

## 2015-11-04 DIAGNOSIS — E119 Type 2 diabetes mellitus without complications: Secondary | ICD-10-CM | POA: Diagnosis not present

## 2015-11-04 DIAGNOSIS — Z87442 Personal history of urinary calculi: Secondary | ICD-10-CM | POA: Diagnosis not present

## 2015-11-04 DIAGNOSIS — Z833 Family history of diabetes mellitus: Secondary | ICD-10-CM | POA: Diagnosis not present

## 2015-11-04 MED ORDER — OXYCODONE HCL 5 MG PO TABS: 5.0000 mg | ORAL_TABLET | Freq: Four times a day (QID) | ORAL | 0 refills | Status: DC | PRN

## 2015-11-04 MED ORDER — GABAPENTIN 600 MG PO TABS: 600.0000 mg | ORAL_TABLET | Freq: Four times a day (QID) | ORAL | 0 refills | Status: DC

## 2015-11-04 NOTE — Progress Notes (Signed)
Patient's Name: Adam Petty.  MRN: [id number]  Referring Provider: No ref. provider found  DOB: 31-Dec-1960  PCP: No PCP Per Patient  DOS: 11/04/2015  Note by: Sydnee Levans. Laban Emperor, MD  Service setting: Ambulatory outpatient  Specialty: Interventional Pain Management  Location: [facility] (AMB) Pain Management Facility    Patient type: Established   Primary Reason(s) for Visit: Encounter for prescription drug management & post-procedure evaluation of chronic illness with mild to moderate exacerbation(Level of risk: moderate) CC: Back Pain (lower ) and Knee Pain  HPI  [name] is a 55 y.o. year old, male patient, who comes today for an initial evaluation. 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); Discogenic syndrome, lumbar (WC injury); Osteoarthritis of spine with radiculopathy, lumbar region (WC injury); Lumbar facet syndrome (WC injury) (Bilateral) (R>L); Lumbosacral radiculopathy (WC injury); Pain in right knee (WC injury); Obesity, Class III, BMI 40-49.9 (morbid obesity) (HCC); Uncomplicated opioid dependence (HCC); Therapeutic opioid-induced constipation (OIC); Dyslipidemia; Microalbuminuria; Adiposity; Lumbar canal stenosis; Type 2 diabetes mellitus (HCC); Chronic pain (WC injury); Long term prescription opiate use; Encounter for chronic pain management; Neurogenic pain; Neuropathic pain; Musculoskeletal pain; Lumbar spondylosis; Chronic knee pain (Location of Secondary source of pain) (Right); Osteoarthritis of knee (Location of Secondary source of pain) (Right); Chronic shoulder pain (Location of Tertiary source of pain) (Right); Osteoarthritis of shoulder (Location of Tertiary source of pain) (Right); Chronic lower  extremity pain (Right); Recurrent nephrolithiasis; and Acute medial meniscal tear on his problem list.. His primarily concern today is the Back Pain (lower ) and Knee Pain  Pain Assessment: Self-Reported Pain Score: 5 /10 Clinically the patient looks like a 2/10 Reported level is inconsistent with clinical observations. Information on the proper use of the pain score provided to the patient today. Pain Type: Chronic pain Pain Location: Back (right knee ) Pain Orientation: Lower Pain Descriptors / Indicators: Constant, Sharp, Shooting Pain Frequency: Constant  Mr. Shirk was last seen on 09/29/2015 for a procedure. During today's appointment we reviewed Mr. Laufer post-procedure results, as well as the outpatient medication regimen. Details on both assessments and plan are as follows.  Controlled Substance Pharmacotherapy Assessment REMS (Risk Evaluation and Mitigation Strategy)  Analgesic:Oxycodone IR 5 mg 1 tablet by mouth every 6 hours when necessary for pain (20 mg/day) MME/day:30 mg/day  Nursing Pain Medication Assessment:  Safety precautions to be maintained throughout the outpatient stay will include: orient to surroundings, keep bed in low position, maintain call bell within reach at all times, provide assistance with transfer out of bed and ambulation.  Medication Inspection Compliance: Pill count conducted under aseptic conditions, in front of the patient. Neither the pills nor the bottle was removed from the patient's sight at any time. Once count was completed pills were immediately returned to the patient in their original bottle. Pill Count: 86 of 120 pills remain Bottle Appearance: Standard pharmacy container. Clearly labeled. Medication: Oxycodone IR Filled Date: 10 / 11 / 2017 Pharmacokinetics: Onset of action (Liberation/Absorption): Within expected pharmacological parameters Time to Peak effect (Distribution): Timing and results are as within normal expected  parameters Duration of action (Metabolism/Excretion): Within normal limits for medication Pharmacodynamics: Analgesic Effect: More than 50% Activity Facilitation: Medication(s) allow patient to sit, stand, walk, and do the basic ADLs  Perceived Effectiveness: Described as relatively effective, allowing for increase in activities of daily living (ADL) Side-effects or Adverse reactions: None reported Monitoring: [place] PMP: Online review of the past 15-month period conducted. Compliant with practice rules and regulations List of all UDS test(s) done:  Lab Results  Component Value Date   TOXASSSELUR FINAL 03/12/2015   TOXASSSELUR FINAL 12/22/2014   Last UDS on record: ToxAssure Select 13  Date Value Ref Range Status  03/12/2015 FINAL  Final    Comment:    ==================================================================== TOXASSURE SELECT 13 (MW) ==================================================================== Test                             Result       Flag       Units Drug Present and Declared for Prescription Verification   Oxycodone                      892          EXPECTED   ng/mg creat   Oxymorphone                    524          EXPECTED   ng/mg creat   Noroxycodone                   1363         EXPECTED   ng/mg creat   Noroxymorphone                 178          EXPECTED   ng/mg creat    Sources of oxycodone are scheduled prescription medications.    Oxymorphone, noroxycodone, and noroxymorphone are expected    metabolites of oxycodone. Oxymorphone is also available as a    scheduled prescription medication. ==================================================================== Test                      Result    Flag   Units      Ref Range   Creatinine              49               mg/dL      >=45 ==================================================================== Declared Medications:  The flagging and interpretation on this report are based on the  following  declared medications.  Unexpected results may arise from  inaccuracies in the declared medications.  **Note: The testing scope of this panel includes these medications:  Oxycodone  **Note: The testing scope of this panel does not include following  reported medications:  Bisacodyl  Canagliflozin (Invokamet)  Gabapentin  Insulin (NovoLog)  Liraglutide (Victoza)  Metformin (Invokamet)  Ramipril  Rosuvastatin  Supplement ==================================================================== For clinical consultation, please call [phone]. ====================================================================    UDS interpretation: Compliant          Medication Assessment Form: Reviewed. Patient indicates being compliant with therapy Treatment compliance: Compliant Risk Assessment Profile: Aberrant/High Risk Behavior: None observed or detected today Risk Factors for Fatal Opioid Overdose: None new ones identified today Substance Use Disorder (SUD) Risk Level: Low Opioid Risk Tool (ORT) Total Score:  3  ORT Score Interpretation Table:  Score <3 = Low Risk for SUD  Score between 4-7 = Moderate Risk for SUD  Score >8 = High Risk for Opioid Abuse   Risk Mitigation Strategies:  Patient Counseling:  Covered  Patient-Prescriber Agreement (PPA): Present and active  Notification to other healthcare providers: Done  Pharmacologic Plan: No change in therapy, at this time  Post-Procedure Assessment  Procedure done on 09/29/2015: Diagnostic bilateral lumbar facet block under fluoroscopic guidance and IV sedation. Complications experienced at the time of the procedure: Although the patient did not have any complications from the procedure itself, he did end up having an episode of nephrolithiasis. He does have a long-standing history of kidney stones that occasionally will give him pain as they pass. He normally experiences this pain in the lower back and it radiates to his groin and  testicle area. This is how the patient was able to tell that the increase in his pain was secondary to the kidney stones that he was passing. Unfortunately, this also made it very difficult for him to be able to fully and fairly evaluate the results of this procedure. Because of this, he has a quest of that we repeated so that he can give [place] a fair assessment on it. He has indicated that he knew he was beginning to pass the stone, but did not think that it would interfere with the procedure itself. As he returns to the clinics today to provide [place] with the report on the results of the procedure, he now realizes that it did. Side-effects or Adverse reactions: None reported Sedation: Sedation provided. When no sedatives are used, the analgesic levels obtained are directly associated with the effectiveness of the local anesthetics. On the other hand, when sedation is provided, the level of analgesia obtained during the initial 1 hour, immediately following the intervention, is believed to be the result of a combination of factors. These factors may include, but are not limited to: 1. The effectiveness of the local anesthetics used. 2. The effects of the analgesic(s) and/or anxiolytic(s) used. 3. The degree of discomfort experienced by the patient at the time of the procedure. 4. The patients ability and reliability in recalling and recording the events. 5. The presence and influence of possible secondary gains. Results: Relief during the 1st hour after the procedure: 50 % (Ultra-Short Term Relief) Interpretative note: Analgesia during this period is likely to be Local Anesthetic and/or IV Sedative (Analgesic/Anxiolitic) related. Unable to give [place] a fair assessment due to his kidney stone pain. Local Anesthesia: Long-acting (4-6 hours) anesthetics used. The analgesic levels attained during this period are directly associated to the localized infiltration of local anesthetics and therefore cary significant  diagnostic value as to the etiological location or origin of the pain. Results: Relief during the next 4 to 6 hour after the procedure: 50 % (Short Term Relief) Interpretative note: Complete relief confirms area to be the source of pain. Unable to give [place] a fair assessment due to his kidney stone. Long-Term Therapy: Steroids used. Results: Extended relief following procedure: 50 % (for 2 weeks had relief ) Interpretative note: Long-term benefit would suggest an inflammatory etiology to the pain.         Long-Term Benefits:  Current Relief (Now): <50%  Interpretative note: Recurrance of symptoms after a period of improvement would suggest involvement of an inflammatory process with persistent aggravating factors Interpretation of Results: These result would suggest area to be involved, however, further evaluation and testing may be required.          Laboratory Chemistry  Inflammation Markers Lab Results  Component Value Date   ESRSEDRATE 9 03/12/2015   CRP 0.6 03/12/2015   Renal Function Lab Results  Component Value  Date   BUN 15 03/12/2015   CREATININE 0.82 03/12/2015   GFRAA >60 03/12/2015   GFRNONAA >60 03/12/2015   Hepatic Function Lab Results  Component Value Date   AST 41 03/12/2015   ALT 55 03/12/2015   ALBUMIN 4.2 03/12/2015   Electrolytes Lab Results  Component Value Date   NA 140 03/12/2015   K 4.2 03/12/2015   CL 105 03/12/2015   CALCIUM 10.0 03/12/2015   MG 1.8 03/12/2015   Pain Modulating Vitamins No results found for: Jerry Caras [id number], [id number], [id number], 25OHVITD1, 25OHVITD2, 25OHVITD3, VITAMINB12 Coagulation Parameters Lab Results  Component Value Date   PLT 176 03/13/2015   Cardiovascular Lab Results  Component Value Date   HGB 15.3 03/13/2015   HCT 46.4 03/13/2015   Note: Lab results reviewed.  Recent Diagnostic Imaging Review  No results found.  Meds  The patient has a current medication list which includes the following  prescription(s): cyanocobalamin, gabapentin, naproxen sodium, novolog flexpen, oxycodone, oxycodone, oxycodone, phentermine, potassium citrate, ramipril, rosuvastatin, tresiba flextouch, victoza, and vitamin e.  Current Outpatient Prescriptions on File Prior to Visit  Medication Sig  . cyanocobalamin (,VITAMIN B-12,) 1000 MCG/ML injection Inject into the muscle every 30 (thirty) days.   . naproxen sodium (ANAPROX) 220 MG tablet Take 220 mg by mouth 2 (two) times daily as needed.  Marland Kitchen NOVOLOG FLEXPEN 100 UNIT/ML FlexPen Inject 0-10 Units into the skin daily as needed for high blood sugar (sliding scale). Reported on 02/24/2015  . ramipril (ALTACE) 5 MG capsule Take 5 mg by mouth daily.  . rosuvastatin (CRESTOR) 5 MG tablet Take 5 mg by mouth 3 (three) times a week. Monday Wednesday Friday  . TRESIBA FLEXTOUCH 100 UNIT/ML SOPN INJECT 12 UNITS SUBCUTANEOUSLY ONCE DAILY.  Marland Kitchen VICTOZA 18 MG/3ML SOPN INJECT 0.3 MLS (1.8 MG TOTAL) SUBCUTANEOUSLY ONCE DAILY.  . vitamin E 100 UNIT capsule Take 100 Units by mouth daily.   No current facility-administered medications on file prior to visit.    ROS  Constitutional: Denies any fever or chills Gastrointestinal: No reported hemesis, hematochezia, vomiting, or acute GI distress Musculoskeletal: Denies any acute onset joint swelling, redness, loss of ROM, or weakness Neurological: No reported episodes of acute onset apraxia, aphasia, dysarthria, agnosia, amnesia, paralysis, loss of coordination, or loss of consciousness  Allergies  Mr. Shear has No Known Allergies.  PFSH  Drug: Mr. Crymes  reports that he does not use drugs. Alcohol:  reports that he drinks alcohol. Tobacco:  reports that he has never smoked. He has never used smokeless tobacco. 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 Family: family history includes COPD in his mother; Cancer in his mother; Diabetes in his father; Heart disease in his father and mother.  Past Surgical History:  Procedure Laterality Date  . CERVICAL FUSION    . FOOT SURGERY     RT  . KNEE ARTHROSCOPY Right 10/02/2013   Procedure: RIGHT KNEE ARTHROSCOPY, PARTIAL LATERAL MENISCECTOMY, DEBRIDEMENT, MEDIAL AND LATERAL CHONDROPLASTY;  Surgeon: Loanne Drilling, MD;  Location: [facility];  Service: Orthopedics;  Laterality: Right;  . KNEE ARTHROSCOPY WITH MEDIAL MENISECTOMY Right 03/25/2015   Procedure: RIGHT KNEE ARTHROSCOPY WITH MENISCAL DEBRIDEMENT and chrodroplasty;  Surgeon: Ollen Gross, MD;  Location: [facility];  Service: Orthopedics;  Laterality: Right;  . KNEE BURSECTOMY Right 03/25/2015   Procedure:  RIGHT KNEE PREPATELLA BURSECTOMY;  Surgeon: Ollen Gross, MD;  Location: [facility];  Service: Orthopedics;  Laterality: Right;  . LUMBAR LAMINECTOMY/DECOMPRESSION MICRODISCECTOMY N/A 02/06/2013   Procedure: MICRO LUMBAR DECOMPRESSION L3-L4 and L4 - L5 ;  Surgeon: Javier Docker, MD;  Location: [facility];  Service: Orthopedics;  Laterality: N/A;  . PALATE SURGERY     surg for sleep apnea and sinus issues  . SHOULDER SURGERY     x5 on RT / x1 LFT  . TONSILLECTOMY     Constitutional Exam  General appearance: Well nourished, well developed, and well hydrated. In no apparent acute distress Vitals:   11/04/15 1356  BP: 132/70  Pulse: (!) 110  Resp: 14  Temp: 98.1 F (36.7 C)  SpO2: 99%  Weight: 237 lb (107.5 kg)  Height: 5\' 6"  (1.676 m)   BMI Assessment: Estimated body mass index is 38.25 kg/m as calculated from the following:   Height as of this encounter: 5\' 6"  (1.676 m).   Weight as of this encounter: 237 lb (107.5 kg).  BMI interpretation table: BMI level Category Range association with higher incidence of chronic pain  <18 kg/m2 Underweight   18.5-24.9 kg/m2 Ideal body weight    25-29.9 kg/m2 Overweight Increased incidence by 20%  30-34.9 kg/m2 Obese (Class I) Increased incidence by 68%  35-39.9 kg/m2 Severe obesity (Class II) Increased incidence by 136%  >40 kg/m2 Extreme obesity (Class III) Increased incidence by 254%   BMI Readings from Last 4 Encounters:  11/04/15 38.25 kg/m  09/29/15 39.06 kg/m  09/08/15 38.74 kg/m  06/08/15 38.74 kg/m   Wt Readings from Last 4 Encounters:  11/04/15 237 lb (107.5 kg)  09/29/15 242 lb (109.8 kg)  09/08/15 240 lb (108.9 kg)  06/08/15 240 lb (108.9 kg)  Psych/Mental status: Alert, oriented x 3 (person, place, & time) Eyes: PERLA Respiratory: No evidence of acute respiratory distress  Cervical Spine Exam  Inspection: No masses, redness, or swelling Alignment: Symmetrical Functional ROM: Unrestricted ROM Stability: No instability detected Muscle strength & Tone: Functionally intact Sensory: Unimpaired Palpation: Non-contributory  Upper Extremity (UE) Exam    Side: Right upper extremity  Side: Left upper extremity  Inspection: No masses, redness, swelling, or asymmetry  Inspection: No masses, redness, swelling, or asymmetry  Functional ROM: Unrestricted ROM         Functional ROM: Unrestricted ROM          Muscle strength & Tone: Functionally intact  Muscle strength & Tone: Functionally intact  Sensory: Unimpaired  Sensory: Unimpaired  Palpation: Non-contributory  Palpation: Non-contributory   Thoracic Spine Exam  Inspection: No masses, redness, or swelling Alignment: Symmetrical Functional ROM: Unrestricted ROM Stability: No instability detected Sensory: Unimpaired Muscle strength & Tone: Functionally intact Palpation: Non-contributory  Lumbar Spine Exam  Inspection: No masses, redness, or swelling Alignment: Symmetrical Functional ROM: Decreased ROM Stability: No instability detected Muscle strength & Tone: Increased muscle tone over affected area Sensory: Movement-associated pain Palpation:  Complains of area being tender to palpation Provocative Tests: Lumbar Hyperextension and rotation test: evaluation deferred today       Patrick's Maneuver: evaluation deferred today              Gait & Posture Assessment  Ambulation: Unassisted Gait: Relatively normal for age and body habitus Posture: WNL   Lower Extremity Exam    Side: Right lower extremity  Side: Left lower extremity  Inspection: No masses, redness, swelling, or asymmetry  Inspection: No masses, redness, swelling, or asymmetry  Functional ROM: Unrestricted ROM          Functional ROM: Unrestricted ROM          Muscle strength & Tone: Functionally intact  Muscle strength & Tone: Functionally intact  Sensory: Unimpaired  Sensory: Unimpaired  Palpation: Non-contributory  Palpation: Non-contributory   Assessment  Primary Diagnosis & Pertinent Problem List: The primary encounter diagnosis was Lumbar facet syndrome (WC injury) (Bilateral) (R>L). Diagnoses of Chronic low back pain (WC injury) (Location of Primary Source of Pain) (Bilateral) (R>L), Neurogenic pain, and Chronic pain syndrome were also pertinent to this visit.  Visit Diagnosis: 1. Lumbar facet syndrome (WC injury) (Bilateral) (R>L)   2. Chronic low back pain (WC injury) (Location of Primary Source of Pain) (Bilateral) (R>L)   3. Neurogenic pain   4. Chronic pain syndrome    Plan of Care  Pharmacotherapy (Medications Ordered): Meds ordered this encounter  Medications  . gabapentin (NEURONTIN) 600 MG tablet    Sig: Take 1 tablet (600 mg total) by mouth every 6 (six) hours.    Dispense:  360 tablet    Refill:  0    Do not place this medication, or any other prescription from our practice, on "Automatic Refill". Patient may have prescription filled one day early if pharmacy is closed on scheduled refill date.  Marland Kitchen oxyCODONE (OXY IR/ROXICODONE) 5 MG immediate release tablet    Sig: Take 1 tablet (5 mg total) by mouth every 6 (six) hours as needed for severe  pain.    Dispense:  120 tablet    Refill:  0    Do not place this medication, or any other prescription from our practice, on "Automatic Refill". Patient may have prescription filled one day early if pharmacy is closed on scheduled refill date. Do not fill until: 12/14/15 To last until: 01/13/16  . oxyCODONE (OXY IR/ROXICODONE) 5 MG immediate release tablet    Sig: Take 1 tablet (5 mg total) by mouth every 6 (six) hours as needed for severe pain.    Dispense:  120 tablet    Refill:  0    Do not place this medication, or any other prescription from our practice, on "Automatic Refill". Patient may have prescription filled one day early if pharmacy is closed on scheduled refill date. Do not fill until: 01/13/16 To last until: 02/12/16  . oxyCODONE (OXY IR/ROXICODONE) 5 MG immediate release tablet    Sig: Take 1 tablet (5 mg total) by mouth every 6 (six) hours as needed for severe pain.    Dispense:  120 tablet    Refill:  0    Do not place this medication, or any other prescription from our practice, on "Automatic Refill". Patient may have prescription filled one day early if pharmacy is closed on scheduled refill date. Do not fill until: 02/12/16 To last until: 03/13/16   New Prescriptions   No medications on file   Medications administered during this visit: Mr. Darley had no medications administered during this visit. Lab-work, Procedure(s), & Referral(s) Ordered: Orders Placed This Encounter  Procedures  . LUMBAR FACET(MEDIAL BRANCH NERVE BLOCK) MBNB   Imaging & Referral(s) Ordered: None  Interventional Therapies: Pending/Scheduled/Planned:   Repeat diagnostic bilateral lumbar facet block under fluoroscopic guidance and IV sedation.    Considering:   Diagnostic bilateral lumbar facet block under fluoroscopic guidance and IV sedation.  Possible bilateral lumbar facet radiofrequency ablation under fluoroscopic guidance and IV sedation.  Diagnostic right knee genicular nerve  block under fluoroscopic guidance, with  or without sedation.  Possible right knee genicular nerve radiofrequency ablation under fluoroscopic guidance and IV sedation, the peroneal and the results of the diagnostic injection.    PRN Procedures:   None at this time.    Requested PM Follow-up: Return in about 3 months (around 02/04/2016) for Med-Mgmt, In addition, Schedule Procedure.  Future Appointments Date Time Provider Department Center  02/04/2016 8:00 AM Delano Metz, MD [facility] None   Primary Care Physician: No PCP Per Patient Location: [facility] Note by: Sydnee Levans. Laban Emperor, M.D, DABA, DABAPM, DABPM, DABIPP, FIPP  Pain Score Disclaimer: We use the NRS-11 scale. This is a self-reported, subjective measurement of pain severity with only modest accuracy. It is used primarily to identify changes within a particular patient. It must be understood that outpatient pain scales are significantly less accurate that those used for research, where they can be applied under ideal controlled circumstances with minimal exposure to variables. In reality, the score is likely to be a combination of pain intensity and pain affect, where pain affect describes the degree of emotional arousal or changes in action readiness caused by the sensory experience of pain. Factors such as social and work situation, setting, emotional state, anxiety levels, expectation, and prior pain experience may influence pain perception and show large inter-individual differences that may also be affected by time variables.  Patient instructions provided during this appointment: Patient Instructions  Oxycodone tablets or capsules What is this medicine? OXYCODONE (ox i KOE done) is a pain reliever. It is used to treat moderate to severe pain. This medicine may be used for other purposes; ask your health care provider or pharmacist if you have questions. What should I tell my health care  provider before I take this medicine? They need to know if you have any of these conditions: -Addison's disease -brain tumor -head injury -heart disease -history of drug or alcohol abuse problem -if you often drink alcohol -kidney disease -liver disease -lung or breathing disease, like asthma -mental illness -pancreatic disease -seizures -thyroid disease -an unusual or allergic reaction to oxycodone, codeine, hydrocodone, morphine, other medicines, foods, dyes, or preservatives -pregnant or trying to get pregnant -breast-feeding How should I use this medicine? Take this medicine by mouth with a glass of water. Follow the directions on the prescription label. You can take it with or without food. If it upsets your stomach, take it with food. Take your medicine at regular intervals. Do not take it more often than directed. Do not stop taking except on your doctor's advice. Some brands of this medicine, like Oxecta, have special instructions. Ask your doctor or pharmacist if these directions are for you: Do not cut, crush or chew this medicine. Swallow only one tablet at a time. Do not wet, soak, or lick the tablet before you take it. Talk to your pediatrician regarding the use of this medicine in children. Special care may be needed. Overdosage: If you think you have taken too much of this medicine contact a poison control center or emergency room at once. NOTE: This medicine is only for you. Do not share this medicine with others. What if I miss a dose? If you miss a dose, take it as soon as you can. If it is almost time for your next dose, take only that dose. Do not take double or extra doses. What may interact with this medicine? -alcohol -antihistamines -certain medicines used for nausea like chlorpromazine, droperidol -erythromycin -ketoconazole -medicines for depression, anxiety, or psychotic  disturbances -medicines for sleep -muscle relaxants -naloxone -naltrexone -narcotic  medicines (opiates) for pain -nilotinib -phenobarbital -phenytoin -rifampin -ritonavir -voriconazole This list may not describe all possible interactions. Give your health care provider a list of all the medicines, herbs, non-prescription drugs, or dietary supplements you use. Also tell them if you smoke, drink alcohol, or use illegal drugs. Some items may interact with your medicine. What should I watch for while using this medicine? Tell your doctor or health care professional if your pain does not go away, if it gets worse, or if you have new or a different type of pain. You may develop tolerance to the medicine. Tolerance means that you will need a higher dose of the medicine for pain relief. Tolerance is normal and is expected if you take this medicine for a long time. Do not suddenly stop taking your medicine because you may develop a severe reaction. Your body becomes used to the medicine. This does NOT mean you are addicted. Addiction is a behavior related to getting and using a drug for a non-medical reason. If you have pain, you have a medical reason to take pain medicine. Your doctor will tell you how much medicine to take. If your doctor wants you to stop the medicine, the dose will be slowly lowered over time to avoid any side effects. You may get drowsy or dizzy when you first start taking this medicine or change doses. Do not drive, use machinery, or do anything that may be dangerous until you know how the medicine affects you. Stand or sit up slowly. There are different types of narcotic medicines (opiates) for pain. If you take more than one type at the same time, you may have more side effects. Give your health care provider a list of all medicines you use. Your doctor will tell you how much medicine to take. Do not take more medicine than directed. Call emergency for help if you have problems breathing. This medicine will cause constipation. Try to have a bowel movement at least every  2 to 3 days. If you do not have a bowel movement for 3 days, call your doctor or health care professional. Your mouth may get dry. Drinking water, chewing sugarless gum, or sucking on hard candy may help. See your dentist every 6 months. What side effects may I notice from receiving this medicine? Side effects that you should report to your doctor or health care professional as soon as possible: -allergic reactions like skin rash, itching or hives, swelling of the face, lips, or tongue -breathing problems -confusion -feeling faint or lightheaded, falls -trouble passing urine or change in the amount of urine -unusually weak or tired Side effects that usually do not require medical attention (report to your doctor or health care professional if they continue or are bothersome): -constipation -dry mouth -itching -nausea, vomiting -upset stomach This list may not describe all possible side effects. Call your doctor for medical advice about side effects. You may report side effects to FDA at 1-800-FDA-1088. Where should I keep my medicine? Keep out of the reach of children. This medicine can be abused. Keep your medicine in a safe place to protect it from theft. Do not share this medicine with anyone. Selling or giving away this medicine is dangerous and against the law. Store at room temperature between 15 and 30 degrees C (59 and 86 degrees F). Protect from light. Keep container tightly closed. This medicine may cause accidental overdose and death if it is taken  by other adults, children, or pets. Flush any unused medicine down the toilet to reduce the chance of harm. Do not use the medicine after the expiration date. NOTE: This sheet is a summary. It may not cover all possible information. If you have questions about this medicine, talk to your doctor, pharmacist, or health care provider.    2016, Elsevier/[employer] Standard. (2014-05-17 01:15:14) Tapentadol extended-release tablets What is this  medicine? TAPENTADOL (ta PEN ta dol) is a pain reliever. It is used to treat moderate to severe pain that lasts for more than a few days. It is also used to treat nerve pain caused by diabetes. This medicine may be used for other purposes; ask your health care provider or pharmacist if you have questions. What should I tell my health care provider before I take this medicine? They need to know if you have any of these conditions: -Addison's disease -gallbladder disease -head injury -history of a drug or alcohol abuse problem -if you often drink alcohol -kidney disease -liver disease -low blood pressure -lung or breathing disease, like asthma -mental illness -prostate disease -seizures -stomach or intestine problems -thyroid disease -an unusual or allergic reaction to tapentadol, other medicines, foods, dyes, or preservatives -pregnant or trying to get pregnant -breast-feeding How should I use this medicine? Take this medicine by mouth with a glass of water. Do not cut, crush, or chew this medicine. Do not take a tablet that is not whole. A broken or crushed tablet can be very dangerous. You may get too much medicine. Swallow only one tablet at a time. Do not wet, soak, or lick the tablet before you take it. You can take it with or without food. If it upsets your stomach, take it with food. Follow the directions on the prescription label. Take your medicine at regular intervals. Do not take it more often than directed. Do not stop taking except on your doctor's advice. A special MedGuide will be given to you by the pharmacist with each prescription and refill. Be sure to read this information carefully each time. Talk to your pediatrician regarding the use of this medicine in children. Special care may be needed. Overdosage: If you think you have taken too much of this medicine contact a poison control center or emergency room at once. NOTE: This medicine is only for you. Do not share this  medicine with others. What if I miss a dose? If you miss a dose, take it as soon as you can. If it is almost time for your next dose, take only that dose. Do not take double or extra doses. What may interact with this medicine? Do not take this medicine with any of the following medications: -alcohol or any product that contains alcohol -MAOIs like Carbex, Eldepryl, Marplan, Nardil, and Parnate This medicine may also interact with the following medications: -antihistamines for allergy, cough, and cold -atropine -buprenorphine -certain medicines for bladder problems like oxybutynin, tolterodine -certain medicines for depression, anxiety, or psychotic disturbances -certain medicines for migraine headache like almotriptan, eletriptan, frovatriptan, naratriptan, rizatriptan, sumatriptan, zolmitriptan -certain medicines for Parkinson's disease like benztropine, trihexyphenidyl -certain medicines for stomach problems like dicyclomine, hyoscyamine -certain medicines for travel sickness like scopolamine -ipratropium -medicines for seizures like carbamazepine, phenobarbital, phenytoin -medicines for sleep -muscle relaxants -narcotic medicines (opiates) for pain -phenothiazines like chlorpromazine, mesoridazine, prochlorperazine, thioridazine This list may not describe all possible interactions. Give your health care provider a list of all the medicines, herbs, non-prescription drugs, or dietary supplements you use. Also  tell them if you smoke, drink alcohol, or use illegal drugs. Some items may interact with your medicine. What should I watch for while using this medicine? Tell your doctor or health care professional if your pain does not go away, if it gets worse, or if you have new or a different type of pain. You may develop tolerance to the medicine. Tolerance means that you will need a higher dose of the medicine for pain relief. Tolerance is normal and is expected if you take the medicine for a  long time. Do not suddenly stop taking your medicine because you may develop a severe reaction. Your body becomes used to the medicine. This does NOT mean you are addicted. Addiction is a behavior related to getting and using a drug for a non-medical reason. If you have pain, you have a medical reason to take pain medicine. Your doctor will tell you how much medicine to take. If your doctor wants you to stop the medicine, the dose will be slowly lowered over time to avoid any side effects. You may get drowsy or dizzy. Do not drive, use machinery, or do anything that needs mental alertness until you know how this medicine affects you. Do not stand or sit up quickly, especially if you are an older patient. This reduces the risk of dizzy or fainting spells. Do not drink, eat, or take anything that may contain alcohol. Alcohol makes this medicine work incorrectly. You may have very dangerous side effects if you take the medicine with any alcohol. There are different types of narcotic medicines (opiates) for pain. If you take more than one type at the same time, you may have more side effects. Give your health care provider a list of all medicines you use. Your doctor will tell you how much medicine to take. Do not take more medicine than directed. Call emergency for help if you have problems breathing. This medicine will cause constipation. Try to have a bowel movement at least every 2 to 3 days. If you do not have a bowel movement for 3 days, call your doctor or health care professional. What side effects may I notice from receiving this medicine? Side effects that you should report to your doctor or health care professional as soon as possible: -allergic reactions like skin rash, itching or hives, swelling of the face, lips, or tongue -anxious -breathing problems -changes in vision -confusion -fast, irregular heartbeat -feeling faint or lightheaded, falls -hallucinations -muscle  spasms -seizures -trouble passing urine or change in the amount of urine -unusually weak or tired Side effects that usually do not require medical attention (Report these to your doctor or health care professional if they continue or are bothersome.): -changes in emotions or moods -constipation -headache -nausea, vomiting -sweating -upset stomach This list may not describe all possible side effects. Call your doctor for medical advice about side effects. You may report side effects to FDA at 1-800-FDA-1088. Where should I keep my medicine? Keep out of the reach of children. This medicine can be abused. Keep your medicine in a safe place to protect it from theft. Do not share this medicine with anyone. Selling or giving away this medicine is dangerous and against the law. Store at room temperature between 15 and 30 degrees C (59 and 86 degrees F). Protect from moisture. This medicine may cause accidental overdose and death if it is taken by other adults, children, or pets. Flush any unused medicine down the toilet to reduce the chance of  harm. Do not use the medicine after the expiration date. NOTE: This sheet is a summary. It may not cover all possible information. If you have questions about this medicine, talk to your doctor, pharmacist, or health care provider.    2016, Elsevier/[employer] Standard. (2012-08-01 16:38:44) Radiofrequency Lesioning Radiofrequency lesioning is a procedure that is performed to relieve pain. The procedure is often used for back, neck, or arm pain. Radiofrequency lesioning involves the use of a machine that creates radio waves to make heat. During the procedure, the heat is applied to the nerve that carries the pain signal. The heat damages the nerve and interferes with the pain signal. Pain relief usually lasts for 6 months to 1 year. LET [facility] C[facility] PROVIDER KNOW ABOUT:  Any allergies you have.  All medicines you are taking, including vitamins, herbs, eye  drops, creams, and over-the-counter medicines.  Previous problems you or members of your family have had with the use of anesthetics.  Any blood disorders you have.  Previous surgeries you have had.  Any medical conditions you have.  Whether you are pregnant or may be pregnant. RISKS AND COMPLICATIONS Generally, this is a safe procedure. However, problems may occur, including:  Pain or soreness at the injection site.  Infection at the injection site.  Damage to nerves or blood vessels. BEFORE THE PROCEDURE  Ask your health care provider about:  Changing or stopping your regular medicines. This is especially important if you are taking diabetes medicines or blood thinners.  Taking medicines such as aspirin and ibuprofen. These medicines can thin your blood. Do not take these medicines before your procedure if your health care provider instructs you not to.  Follow instructions from your health care provider about eating or drinking restrictions.  Plan to have someone take you home after the procedure.  If you go home right after the procedure, plan to have someone with you for 24 hours. PROCEDURE  You will be given one or more of the following:  A medicine to help you relax (sedative).  A medicine to numb the area (local anesthetic).  You will be awake during the procedure. You will need to be able to talk with the health care provider during the procedure.  With the help of a type of X-ray (fluoroscopy), the health care provider will insert a radiofrequency needle into the area to be treated.  Next, a wire that carries the radio waves (electrode) will be put through the radiofrequency needle. An electrical pulse will be sent through the electrode to verify the correct nerve. You will feel a tingling sensation, and you may have muscle twitching.  Then, the tissue that is around the needle tip will be heated by an electric current that is passed using the radiofrequency  machine. This will numb the nerves.  A bandage (dressing) will be put on the insertion area after the procedure is done. The procedure may vary among health care providers and hospitals. AFTER THE PROCEDURE  Your blood pressure, heart rate, breathing rate, and blood oxygen level will be monitored often until the medicines you were given have worn off.  Return to your normal activities as directed by your health care provider.   This information is not intended to replace advice given to you by your health care provider. Make sure you discuss any questions you have with your health care provider.   Document Released: 09/01/2010 Document Revised: 09/24/2014 Document Reviewed: 02/10/2014 Elsevier Interactive Patient Education [employer]2016 [employer] Inc.

## 2015-11-04 NOTE — Progress Notes (Signed)
Nursing Pain Medication Assessment:  Safety precautions to be maintained throughout the outpatient stay will include: orient to surroundings, keep bed in low position, maintain call bell within reach at all times, provide assistance with transfer out of bed and ambulation.  Medication Inspection Compliance: Pill count conducted under aseptic conditions, in front of the patient. Neither the pills nor the bottle was removed from the patient's sight at any time. Once count was completed pills were immediately returned to the patient in their original bottle. Pill Count: 86 of 120 pills remain Bottle Appearance: Standard pharmacy container. Clearly labeled. Medication: Oxycodone IR Filled Date: 10 / 11 / 2017

## 2015-11-04 NOTE — Patient Instructions (Signed)
Oxycodone tablets or capsules What is this medicine? OXYCODONE (ox i KOE done) is a pain reliever. It is used to treat moderate to severe pain. This medicine may be used for other purposes; ask your health care provider or pharmacist if you have questions. What should I tell my health care provider before I take this medicine? They need to know if you have any of these conditions: -Addison's disease -brain tumor -head injury -heart disease -history of drug or alcohol abuse problem -if you often drink alcohol -kidney disease -liver disease -lung or breathing disease, like asthma -mental illness -pancreatic disease -seizures -thyroid disease -an unusual or allergic reaction to oxycodone, codeine, hydrocodone, morphine, other medicines, foods, dyes, or preservatives -pregnant or trying to get pregnant -breast-feeding How should I use this medicine? Take this medicine by mouth with a glass of water. Follow the directions on the prescription label. You can take it with or without food. If it upsets your stomach, take it with food. Take your medicine at regular intervals. Do not take it more often than directed. Do not stop taking except on your doctor's advice. Some brands of this medicine, like Oxecta, have special instructions. Ask your doctor or pharmacist if these directions are for you: Do not cut, crush or chew this medicine. Swallow only one tablet at a time. Do not wet, soak, or lick the tablet before you take it. Talk to your pediatrician regarding the use of this medicine in children. Special care may be needed. Overdosage: If you think you have taken too much of this medicine contact a poison control center or emergency room at once. NOTE: This medicine is only for you. Do not share this medicine with others. What if I miss a dose? If you miss a dose, take it as soon as you can. If it is almost time for your next dose, take only that dose. Do not take double or extra doses. What  may interact with this medicine? -alcohol -antihistamines -certain medicines used for nausea like chlorpromazine, droperidol -erythromycin -ketoconazole -medicines for depression, anxiety, or psychotic disturbances -medicines for sleep -muscle relaxants -naloxone -naltrexone -narcotic medicines (opiates) for pain -nilotinib -phenobarbital -phenytoin -rifampin -ritonavir -voriconazole This list may not describe all possible interactions. Give your health care provider a list of all the medicines, herbs, non-prescription drugs, or dietary supplements you use. Also tell them if you smoke, drink alcohol, or use illegal drugs. Some items may interact with your medicine. What should I watch for while using this medicine? Tell your doctor or health care professional if your pain does not go away, if it gets worse, or if you have new or a different type of pain. You may develop tolerance to the medicine. Tolerance means that you will need a higher dose of the medicine for pain relief. Tolerance is normal and is expected if you take this medicine for a long time. Do not suddenly stop taking your medicine because you may develop a severe reaction. Your body becomes used to the medicine. This does NOT mean you are addicted. Addiction is a behavior related to getting and using a drug for a non-medical reason. If you have pain, you have a medical reason to take pain medicine. Your doctor will tell you how much medicine to take. If your doctor wants you to stop the medicine, the dose will be slowly lowered over time to avoid any side effects. You may get drowsy or dizzy when you first start taking this medicine or change doses.  Do not drive, use machinery, or do anything that may be dangerous until you know how the medicine affects you. Stand or sit up slowly. There are different types of narcotic medicines (opiates) for pain. If you take more than one type at the same time, you may have more side effects.  Give your health care provider a list of all medicines you use. Your doctor will tell you how much medicine to take. Do not take more medicine than directed. Call emergency for help if you have problems breathing. This medicine will cause constipation. Try to have a bowel movement at least every 2 to 3 days. If you do not have a bowel movement for 3 days, call your doctor or health care professional. Your mouth may get dry. Drinking water, chewing sugarless gum, or sucking on hard candy may help. See your dentist every 6 months. What side effects may I notice from receiving this medicine? Side effects that you should report to your doctor or health care professional as soon as possible: -allergic reactions like skin rash, itching or hives, swelling of the face, lips, or tongue -breathing problems -confusion -feeling faint or lightheaded, falls -trouble passing urine or change in the amount of urine -unusually weak or tired Side effects that usually do not require medical attention (report to your doctor or health care professional if they continue or are bothersome): -constipation -dry mouth -itching -nausea, vomiting -upset stomach This list may not describe all possible side effects. Call your doctor for medical advice about side effects. You may report side effects to FDA at 1-800-FDA-1088. Where should I keep my medicine? Keep out of the reach of children. This medicine can be abused. Keep your medicine in a safe place to protect it from theft. Do not share this medicine with anyone. Selling or giving away this medicine is dangerous and against the law. Store at room temperature between 15 and 30 degrees C (59 and 86 degrees F). Protect from light. Keep container tightly closed. This medicine may cause accidental overdose and death if it is taken by other adults, children, or pets. Flush any unused medicine down the toilet to reduce the chance of harm. Do not use the medicine after the  expiration date. NOTE: This sheet is a summary. It may not cover all possible information. If you have questions about this medicine, talk to your doctor, pharmacist, or health care provider.    2016, Elsevier/Gold Standard. (2014-05-17 01:15:14) Tapentadol extended-release tablets What is this medicine? TAPENTADOL (ta PEN ta dol) is a pain reliever. It is used to treat moderate to severe pain that lasts for more than a few days. It is also used to treat nerve pain caused by diabetes. This medicine may be used for other purposes; ask your health care provider or pharmacist if you have questions. What should I tell my health care provider before I take this medicine? They need to know if you have any of these conditions: -Addison's disease -gallbladder disease -head injury -history of a drug or alcohol abuse problem -if you often drink alcohol -kidney disease -liver disease -low blood pressure -lung or breathing disease, like asthma -mental illness -prostate disease -seizures -stomach or intestine problems -thyroid disease -an unusual or allergic reaction to tapentadol, other medicines, foods, dyes, or preservatives -pregnant or trying to get pregnant -breast-feeding How should I use this medicine? Take this medicine by mouth with a glass of water. Do not cut, crush, or chew this medicine. Do not take a tablet  that is not whole. A broken or crushed tablet can be very dangerous. You may get too much medicine. Swallow only one tablet at a time. Do not wet, soak, or lick the tablet before you take it. You can take it with or without food. If it upsets your stomach, take it with food. Follow the directions on the prescription label. Take your medicine at regular intervals. Do not take it more often than directed. Do not stop taking except on your doctor's advice. A special MedGuide will be given to you by the pharmacist with each prescription and refill. Be sure to read this information  carefully each time. Talk to your pediatrician regarding the use of this medicine in children. Special care may be needed. Overdosage: If you think you have taken too much of this medicine contact a poison control center or emergency room at once. NOTE: This medicine is only for you. Do not share this medicine with others. What if I miss a dose? If you miss a dose, take it as soon as you can. If it is almost time for your next dose, take only that dose. Do not take double or extra doses. What may interact with this medicine? Do not take this medicine with any of the following medications: -alcohol or any product that contains alcohol -MAOIs like Carbex, Eldepryl, Marplan, Nardil, and Parnate This medicine may also interact with the following medications: -antihistamines for allergy, cough, and cold -atropine -buprenorphine -certain medicines for bladder problems like oxybutynin, tolterodine -certain medicines for depression, anxiety, or psychotic disturbances -certain medicines for migraine headache like almotriptan, eletriptan, frovatriptan, naratriptan, rizatriptan, sumatriptan, zolmitriptan -certain medicines for Parkinson's disease like benztropine, trihexyphenidyl -certain medicines for stomach problems like dicyclomine, hyoscyamine -certain medicines for travel sickness like scopolamine -ipratropium -medicines for seizures like carbamazepine, phenobarbital, phenytoin -medicines for sleep -muscle relaxants -narcotic medicines (opiates) for pain -phenothiazines like chlorpromazine, mesoridazine, prochlorperazine, thioridazine This list may not describe all possible interactions. Give your health care provider a list of all the medicines, herbs, non-prescription drugs, or dietary supplements you use. Also tell them if you smoke, drink alcohol, or use illegal drugs. Some items may interact with your medicine. What should I watch for while using this medicine? Tell your doctor or health  care professional if your pain does not go away, if it gets worse, or if you have new or a different type of pain. You may develop tolerance to the medicine. Tolerance means that you will need a higher dose of the medicine for pain relief. Tolerance is normal and is expected if you take the medicine for a long time. Do not suddenly stop taking your medicine because you may develop a severe reaction. Your body becomes used to the medicine. This does NOT mean you are addicted. Addiction is a behavior related to getting and using a drug for a non-medical reason. If you have pain, you have a medical reason to take pain medicine. Your doctor will tell you how much medicine to take. If your doctor wants you to stop the medicine, the dose will be slowly lowered over time to avoid any side effects. You may get drowsy or dizzy. Do not drive, use machinery, or do anything that needs mental alertness until you know how this medicine affects you. Do not stand or sit up quickly, especially if you are an older patient. This reduces the risk of dizzy or fainting spells. Do not drink, eat, or take anything that may contain alcohol. Alcohol makes this  medicine work incorrectly. You may have very dangerous side effects if you take the medicine with any alcohol. There are different types of narcotic medicines (opiates) for pain. If you take more than one type at the same time, you may have more side effects. Give your health care provider a list of all medicines you use. Your doctor will tell you how much medicine to take. Do not take more medicine than directed. Call emergency for help if you have problems breathing. This medicine will cause constipation. Try to have a bowel movement at least every 2 to 3 days. If you do not have a bowel movement for 3 days, call your doctor or health care professional. What side effects may I notice from receiving this medicine? Side effects that you should report to your doctor or health  care professional as soon as possible: -allergic reactions like skin rash, itching or hives, swelling of the face, lips, or tongue -anxious -breathing problems -changes in vision -confusion -fast, irregular heartbeat -feeling faint or lightheaded, falls -hallucinations -muscle spasms -seizures -trouble passing urine or change in the amount of urine -unusually weak or tired Side effects that usually do not require medical attention (Report these to your doctor or health care professional if they continue or are bothersome.): -changes in emotions or moods -constipation -headache -nausea, vomiting -sweating -upset stomach This list may not describe all possible side effects. Call your doctor for medical advice about side effects. You may report side effects to FDA at 1-800-FDA-1088. Where should I keep my medicine? Keep out of the reach of children. This medicine can be abused. Keep your medicine in a safe place to protect it from theft. Do not share this medicine with anyone. Selling or giving away this medicine is dangerous and against the law. Store at room temperature between 15 and 30 degrees C (59 and 86 degrees F). Protect from moisture. This medicine may cause accidental overdose and death if it is taken by other adults, children, or pets. Flush any unused medicine down the toilet to reduce the chance of harm. Do not use the medicine after the expiration date. NOTE: This sheet is a summary. It may not cover all possible information. If you have questions about this medicine, talk to your doctor, pharmacist, or health care provider.    2016, Elsevier/Gold Standard. (2012-08-01 16:38:44) Radiofrequency Lesioning Radiofrequency lesioning is a procedure that is performed to relieve pain. The procedure is often used for back, neck, or arm pain. Radiofrequency lesioning involves the use of a machine that creates radio waves to make heat. During the procedure, the heat is applied to the  nerve that carries the pain signal. The heat damages the nerve and interferes with the pain signal. Pain relief usually lasts for 6 months to 1 year. LET [facility] CARE PROVIDER KNOW ABOUT:  Any allergies you have.  All medicines you are taking, including vitamins, herbs, eye drops, creams, and over-the-counter medicines.  Previous problems you or members of your family have had with the use of anesthetics.  Any blood disorders you have.  Previous surgeries you have had.  Any medical conditions you have.  Whether you are pregnant or may be pregnant. RISKS AND COMPLICATIONS Generally, this is a safe procedure. However, problems may occur, including:  Pain or soreness at the injection site.  Infection at the injection site.  Damage to nerves or blood vessels. BEFORE THE PROCEDURE  Ask your health care provider about:  Changing or stopping your regular medicines.  This is especially important if you are taking diabetes medicines or blood thinners.  Taking medicines such as aspirin and ibuprofen. These medicines can thin your blood. Do not take these medicines before your procedure if your health care provider instructs you not to.  Follow instructions from your health care provider about eating or drinking restrictions.  Plan to have someone take you home after the procedure.  If you go home right after the procedure, plan to have someone with you for 24 hours. PROCEDURE  You will be given one or more of the following:  A medicine to help you relax (sedative).  A medicine to numb the area (local anesthetic).  You will be awake during the procedure. You will need to be able to talk with the health care provider during the procedure.  With the help of a type of X-ray (fluoroscopy), the health care provider will insert a radiofrequency needle into the area to be treated.  Next, a wire that carries the radio waves (electrode) will be put through the radiofrequency needle.  An electrical pulse will be sent through the electrode to verify the correct nerve. You will feel a tingling sensation, and you may have muscle twitching.  Then, the tissue that is around the needle tip will be heated by an electric current that is passed using the radiofrequency machine. This will numb the nerves.  A bandage (dressing) will be put on the insertion area after the procedure is done. The procedure may vary among health care providers and hospitals. AFTER THE PROCEDURE  Your blood pressure, heart rate, breathing rate, and blood oxygen level will be monitored often until the medicines you were given have worn off.  Return to your normal activities as directed by your health care provider.   This information is not intended to replace advice given to you by your health care provider. Make sure you discuss any questions you have with your health care provider.   Document Released: 09/01/2010 Document Revised: 09/24/2014 Document Reviewed: 02/10/2014 Elsevier Interactive Patient Education [employer]2016 Elsevier Inc.

## 2015-11-05 NOTE — Telephone Encounter (Signed)
I added wait list for patient until 11/24/2015

## 2015-11-17 ENCOUNTER — Encounter
Admission: RE | Admit: 2015-11-17 | Discharge: 2015-11-17 | Disposition: A | Discharge status: Home / Self Care | Payer: BLUE CROSS/BLUE SHIELD | Source: Ambulatory Visit | Attending: Urology | Admitting: Urology

## 2015-11-17 HISTORY — DX: Spinal stenosis, site unspecified: M48.00

## 2015-11-17 HISTORY — DX: Sleep apnea, unspecified: G47.30

## 2015-11-17 NOTE — Patient Instructions (Signed)
  Your procedure is scheduled on: 11-19-15 Report to Same Day Surgery 2nd floor medical mall To find out your arrival time please call [phone] between 1PM - 3PM on 11-18-15  Remember: Instructions that are not followed completely may result in serious medical risk, up to and including death, or upon the discretion of your surgeon and anesthesiologist your surgery may need to be rescheduled.    _x___ 1. Do not eat food or drink liquids after midnight. No gum chewing or hard candies.     __x__ 2. No Alcohol for 24 hours before or after surgery.   __x__3. No Smoking for 24 prior to surgery.   ____  4. Bring all medications with you on the day of surgery if instructed.    __x__ 5. Notify your doctor if there is any change in your medical condition     (cold, fever, infections).     Do not wear jewelry, make-up, hairpins, clips or nail polish.  Do not wear lotions, powders, or perfumes. You may wear deodorant.  Do not shave 48 hours prior to surgery. Men may shave face and neck.  Do not bring valuables to the hospital.    [facility] is not responsible for any belongings or valuables.               Contacts, dentures or bridgework may not be worn into surgery.  Leave your suitcase in the car. After surgery it may be brought to your room.  For patients admitted to the hospital, discharge time is determined by your treatment team.   Patients discharged the day of surgery will not be allowed to drive home.    Please read over the following fact sheets that you were given:   [facility] Preparing for Surgery and or MRSA Information   _x___ Take these medicines the morning of surgery with A SIP OF WATER:    1. GABAPENTIN  2. ALTACE  3.  4.  5.  6.  ____Fleets enema or Magnesium Citrate as directed.   ____ Use CHG Soap or sage wipes as directed on instruction sheet   ____ Use inhalers on the day of surgery and bring to hospital day of surgery  ____ Stop metformin 2 days prior  to surgery    _X___ Take 1/2 of usual insulin dose the night before surgery and none on the morning of  Surgery-NO INSULIN AM OF SURGERY  ____ Stop aspirin or coumadin, or plavix  x__ Stop Anti-inflammatories such as Advil, Aleve, Ibuprofen, Motrin, Naproxen,          Naprosyn, Goodies powders or aspirin products. Ok to take Tylenol.   _X___ Stop supplements until after surgery-STOP VITAMIN E AND PHENTERMINE NOW   ____ Bring C-Pap to the hospital.

## 2015-11-18 ENCOUNTER — Encounter: Payer: Self-pay | Admitting: *Deleted

## 2015-11-18 NOTE — H&P (Signed)
NAME:  Adam Petty, Adam Petty            ACCOUNT NO.:  [id number]  MEDICAL RECORD NO.:  [id number]  LOCATION:  PAT                          FACILITY:  [facility]  PHYSICIAN:  Anola GurneyMichael Wolff          DATE OF BIRTH:  Jan 30, 1960  DATE OF ADMISSION:  11/17/2015 DATE OF DISCHARGE:  11/17/2015                            HISTORY AND PHYSICAL   CHIEF COMPLAINT:  Right flank pain.  HISTORY OF PRESENT ILLNESS:  [name] is a 55 year old, white male, developed right flank pain associated with frequency and urgency in early October.  He did pass 2 small stones at that time.  However, he had persistent pain prompting IVP which on October 19 indicated delayed excretion on the right with obstruction to the distal right ureter.  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  He comes in now for right retrograde with ureteroscopic ureteral lithotomy with holmium laser lithotripsy.  PAST MEDICAL HISTORY:  No drug allergies.  CURRENT MEDICATIONS:  Include oxycodone, Tresiba, phentermine, metformin, gabapentin, ramipril, Crestor, Victoza, NovoLog insulin, nitrofurantoin, and Pyridium.  SURGICAL HISTORY:  Appendectomy in 1974.  Tonsillectomy in 2010. Uvulopalatopharyngoplasty for sleep apnea in 2010.  C-spine fusion in 2010.  Right foot surgery in 2011.  Ureteroscopy with stent placement in 2010.  Left shoulder repair in 2015.  Right knee repair in 2017.  Right shoulder reconstructive surgery x5 from 2012, 2015.  Lumbar fusion in 2016.  SOCIAL HISTORY:  The patient denied tobacco use.  He consumes 1 alcoholic beverage per week.  FAMILY HISTORY:  Negative for urological disease.  PAST AND CURRENT MEDICAL CONDITIONS: 1. Diabetes. 2. Chronic back and knee pain. 3. History of sleep apnea. 4. Uric acid kidney stone disease.  REVIEW OF SYSTEMS:  The patient denied chest pain, shortness of breath, stroke, heart disease, or hypertension.  PHYSICAL EXAMINATION:  GENERAL:  Obese white  male, in no acute distress. HEENT:  Sclerae were clear.  Pupils are equally round, reactive to light and accommodation.  Extraocular movements were intact.  NECK:  Supple. No palpable cervical adenopathy.  LUNGS:  Clear to auscultation. CARDIOVASCULAR:  Regular rhythm, rate without audible murmurs.  ABDOMEN: Soft, nontender abdomen.  GU:  Circumcised testes, nontender, 18 mL in size each.  NEUROMUSCULAR:  Alert and oriented x3.  IMPRESSION: 1. Right renal colic. 2. Right ureteral obstruction with hydronephrosis. 3. History of uric acid stone disease.  PLAN:  Right retrograde with ureteroscopic ureterolithotomy with holmium laser lithotripsy.          ______________________________ Anola GurneyMichael Wolff     MW/MEDQ  D:  11/18/2015  T:  11/18/2015  Job:  [id number]

## 2015-11-19 ENCOUNTER — Encounter: Payer: Self-pay | Admitting: *Deleted

## 2015-11-19 ENCOUNTER — Ambulatory Visit: Payer: BLUE CROSS/BLUE SHIELD | Admitting: Anesthesiology

## 2015-11-19 ENCOUNTER — Ambulatory Visit
Admission: RE | Admit: 2015-11-19 | Discharge: 2015-11-19 | Disposition: A | Discharge status: Home / Self Care | Payer: BLUE CROSS/BLUE SHIELD | Source: Ambulatory Visit | Attending: Urology | Admitting: Urology

## 2015-11-19 ENCOUNTER — Encounter
Admission: RE | Disposition: A | Discharge status: Home / Self Care | Payer: Self-pay | Source: Ambulatory Visit | Attending: Urology

## 2015-11-19 DIAGNOSIS — E119 Type 2 diabetes mellitus without complications: Secondary | ICD-10-CM | POA: Insufficient documentation

## 2015-11-19 DIAGNOSIS — Z6838 Body mass index (BMI) 38.0-38.9, adult: Secondary | ICD-10-CM | POA: Insufficient documentation

## 2015-11-19 DIAGNOSIS — Z794 Long term (current) use of insulin: Secondary | ICD-10-CM | POA: Diagnosis not present

## 2015-11-19 DIAGNOSIS — M1990 Unspecified osteoarthritis, unspecified site: Secondary | ICD-10-CM | POA: Diagnosis not present

## 2015-11-19 DIAGNOSIS — N131 Hydronephrosis with ureteral stricture, not elsewhere classified: Secondary | ICD-10-CM | POA: Diagnosis present

## 2015-11-19 DIAGNOSIS — G4730 Sleep apnea, unspecified: Secondary | ICD-10-CM | POA: Diagnosis not present

## 2015-11-19 DIAGNOSIS — E669 Obesity, unspecified: Secondary | ICD-10-CM | POA: Diagnosis not present

## 2015-11-19 DIAGNOSIS — N201 Calculus of ureter: Secondary | ICD-10-CM

## 2015-11-19 HISTORY — PX: CYSTOSCOPY W/ RETROGRADES: SHX1426

## 2015-11-19 LAB — GLUCOSE, CAPILLARY
GLUCOSE-CAPILLARY: 207 mg/dL — AB (ref 65–99)
GLUCOSE-CAPILLARY: 183 mg/dL — AB (ref 65–99)

## 2015-11-19 SURGERY — CYSTOSCOPY, WITH RETROGRADE PYELOGRAM
Anesthesia: General | Laterality: Right

## 2015-11-19 MED ORDER — BELLADONNA ALKALOIDS-OPIUM 16.2-60 MG RE SUPP
RECTAL | Status: DC | PRN
  Administered 2015-11-19: 1 via RECTAL

## 2015-11-19 MED ORDER — LEVOFLOXACIN IN D5W 500 MG/100ML IV SOLN
500.0000 mg | Freq: Once | INTRAVENOUS | Status: AC
  Administered 2015-11-19: 500 mg via INTRAVENOUS

## 2015-11-19 MED ORDER — HYDROMORPHONE HCL 1 MG/ML IJ SOLN
1.0000 mg | Freq: Once | INTRAMUSCULAR | Status: AC
  Administered 2015-11-19: 1 mg via INTRAVENOUS

## 2015-11-19 MED ORDER — DEXAMETHASONE SODIUM PHOSPHATE 10 MG/ML IJ SOLN
INTRAMUSCULAR | Status: DC | PRN
  Administered 2015-11-19: 5 mg via INTRAVENOUS

## 2015-11-19 MED ORDER — BELLADONNA ALKALOIDS-OPIUM 16.2-60 MG RE SUPP
RECTAL | Status: AC
  Filled 2015-11-19: qty 1

## 2015-11-19 MED ORDER — LIDOCAINE HCL (CARDIAC) 20 MG/ML IV SOLN
INTRAVENOUS | Status: DC | PRN
  Administered 2015-11-19: 100 mg via INTRAVENOUS

## 2015-11-19 MED ORDER — SUCCINYLCHOLINE CHLORIDE 20 MG/ML IJ SOLN
INTRAMUSCULAR | Status: DC | PRN
  Administered 2015-11-19: 100 mg via INTRAVENOUS

## 2015-11-19 MED ORDER — PHENYLEPHRINE HCL 10 MG/ML IJ SOLN
INTRAMUSCULAR | Status: DC | PRN
  Administered 2015-11-19: 100 ug via INTRAVENOUS

## 2015-11-19 MED ORDER — FENTANYL CITRATE (PF) 100 MCG/2ML IJ SOLN
INTRAMUSCULAR | Status: AC
  Filled 2015-11-19: qty 2

## 2015-11-19 MED ORDER — LEVOFLOXACIN IN D5W 500 MG/100ML IV SOLN
INTRAVENOUS | Status: AC
  Administered 2015-11-19: 500 mg via INTRAVENOUS
  Filled 2015-11-19: qty 100

## 2015-11-19 MED ORDER — OXYCODONE HCL 5 MG PO TABS: 5.0000 mg | ORAL_TABLET | Freq: Once | ORAL | Status: DC | PRN

## 2015-11-19 MED ORDER — PROPOFOL 10 MG/ML IV BOLUS
INTRAVENOUS | Status: DC | PRN
Start: 2015-11-19 — End: 2015-11-19
  Administered 2015-11-19: 200 mg via INTRAVENOUS

## 2015-11-19 MED ORDER — SUGAMMADEX SODIUM 500 MG/5ML IV SOLN
INTRAVENOUS | Status: DC | PRN
  Administered 2015-11-19: 500 mg via INTRAVENOUS

## 2015-11-19 MED ORDER — PROMETHAZINE HCL 25 MG/ML IJ SOLN: 6.2500 mg | INTRAMUSCULAR | Status: DC | PRN

## 2015-11-19 MED ORDER — GLYCOPYRROLATE 0.2 MG/ML IJ SOLN
INTRAMUSCULAR | Status: DC | PRN
  Administered 2015-11-19: 0.2 mg via INTRAVENOUS

## 2015-11-19 MED ORDER — SODIUM CHLORIDE 0.9 % IV SOLN
INTRAVENOUS | Status: DC
  Administered 2015-11-19: 07:00:00 via INTRAVENOUS

## 2015-11-19 MED ORDER — ONDANSETRON HCL 4 MG/2ML IJ SOLN
INTRAMUSCULAR | Status: DC | PRN
  Administered 2015-11-19: 4 mg via INTRAVENOUS

## 2015-11-19 MED ORDER — OXYCODONE HCL 5 MG/5ML PO SOLN: 5.0000 mg | Freq: Once | ORAL | Status: DC | PRN

## 2015-11-19 MED ORDER — HYDROMORPHONE HCL 1 MG/ML IJ SOLN
INTRAMUSCULAR | Status: AC
  Filled 2015-11-19: qty 1

## 2015-11-19 MED ORDER — KETOROLAC TROMETHAMINE 30 MG/ML IJ SOLN
INTRAMUSCULAR | Status: AC
  Administered 2015-11-19: 30 mg
  Filled 2015-11-19: qty 1

## 2015-11-19 MED ORDER — MIDAZOLAM HCL 2 MG/2ML IJ SOLN
INTRAMUSCULAR | Status: DC | PRN
  Administered 2015-11-19: 2 mg via INTRAVENOUS

## 2015-11-19 MED ORDER — FAMOTIDINE 20 MG PO TABS
20.0000 mg | ORAL_TABLET | Freq: Once | ORAL | Status: AC
  Administered 2015-11-19: 20 mg via ORAL

## 2015-11-19 MED ORDER — KETOROLAC TROMETHAMINE 30 MG/ML IJ SOLN: 30.0000 mg | Freq: Once | INTRAMUSCULAR | Status: DC

## 2015-11-19 MED ORDER — LIDOCAINE HCL 2 % EX GEL
CUTANEOUS | Status: AC
  Filled 2015-11-19: qty 10

## 2015-11-19 MED ORDER — FENTANYL CITRATE (PF) 100 MCG/2ML IJ SOLN
INTRAMUSCULAR | Status: DC | PRN
  Administered 2015-11-19: 100 ug via INTRAVENOUS

## 2015-11-19 MED ORDER — LIDOCAINE HCL 2 % EX GEL
CUTANEOUS | Status: DC | PRN
  Administered 2015-11-19: 1 via URETHRAL

## 2015-11-19 MED ORDER — FENTANYL CITRATE (PF) 100 MCG/2ML IJ SOLN
25.0000 ug | INTRAMUSCULAR | Status: DC | PRN
  Administered 2015-11-19 (×4): 25 ug via INTRAVENOUS

## 2015-11-19 MED ORDER — FAMOTIDINE 20 MG PO TABS
ORAL_TABLET | ORAL | Status: AC
  Administered 2015-11-19: 20 mg via ORAL
  Filled 2015-11-19: qty 1

## 2015-11-19 MED ORDER — ROCURONIUM BROMIDE 100 MG/10ML IV SOLN
INTRAVENOUS | Status: DC | PRN
  Administered 2015-11-19: 10 mg via INTRAVENOUS
  Administered 2015-11-19: 40 mg via INTRAVENOUS

## 2015-11-19 MED ORDER — LIDOCAINE HCL 2 % EX GEL
CUTANEOUS | Status: DC | PRN
  Administered 2015-11-19: 1 via TOPICAL

## 2015-11-19 MED ORDER — MEPERIDINE HCL 25 MG/ML IJ SOLN: 6.2500 mg | INTRAMUSCULAR | Status: DC | PRN

## 2015-11-19 SURGICAL SUPPLY — 34 items
BAG DRAIN CYSTO-URO LG1000N (MISCELLANEOUS) ×3 IMPLANT
CATH FOL LX CONE TIP  8F (CATHETERS)
CATH FOL LX CONE TIP 8F (CATHETERS) ×1 IMPLANT
CATH URETL 5X70 OPEN END (CATHETERS) ×1 IMPLANT
CNTNR SPEC 2.5X3XGRAD LEK (MISCELLANEOUS)
CONRAY 43 FOR UROLOGY 50M (MISCELLANEOUS) ×1 IMPLANT
CONT SPEC 4OZ STER OR WHT (MISCELLANEOUS)
CONT SPEC 4OZ STRL OR WHT (MISCELLANEOUS)
CONTAINER SPEC 2.5X3XGRAD LEK (MISCELLANEOUS) ×1 IMPLANT
FEE RENTAL LASER STANDBY HOLM (MISCELLANEOUS) ×1 IMPLANT
FEE TECHNICIAN ONLY PER HOUR (MISCELLANEOUS) ×1 IMPLANT
FIBER LASER LITHO 273 (Laser) ×3 IMPLANT
GLOVE BIO SURGEON STRL SZ7 (GLOVE) ×6 IMPLANT
GLOVE BIO SURGEON STRL SZ7.5 (GLOVE) ×3 IMPLANT
GOWN STRL REUS W/ TWL LRG LVL4 (GOWN DISPOSABLE) ×1 IMPLANT
GOWN STRL REUS W/ TWL XL LVL3 (GOWN DISPOSABLE) ×1 IMPLANT
GOWN STRL REUS W/TWL LRG LVL4 (GOWN DISPOSABLE) ×3
GOWN STRL REUS W/TWL XL LVL3 (GOWN DISPOSABLE)
GOWN STRL REUS W/TWL XL LVL4 (GOWN DISPOSABLE) ×3 IMPLANT
GUIDEWIRE STR ZIPWIRE 035X150 (MISCELLANEOUS) ×3 IMPLANT
KIT RM TURNOVER CYSTO AR (KITS) ×3 IMPLANT
LASER HOLMIUM FIBER SU 272UM (MISCELLANEOUS) ×1 IMPLANT
LASER HOLMIUM STANDBY (MISCELLANEOUS) IMPLANT
LASER HOLMIUM SU 940UM (MISCELLANEOUS) ×1 IMPLANT
PACK CYSTO AR (MISCELLANEOUS) ×3 IMPLANT
PREP PVP WINGED SPONGE (MISCELLANEOUS) ×3 IMPLANT
SET CYSTO W/LG BORE CLAMP LF (SET/KITS/TRAYS/PACK) ×3 IMPLANT
SOL .9 NS 3000ML IRR  AL (IV SOLUTION) ×2
SOL .9 NS 3000ML IRR AL (IV SOLUTION) ×1
SOL .9 NS 3000ML IRR UROMATIC (IV SOLUTION) ×1 IMPLANT
SOL PREP PVP 2OZ (MISCELLANEOUS) ×3
SOLUTION PREP PVP 2OZ (MISCELLANEOUS) ×1 IMPLANT
SURGILUBE 2OZ TUBE FLIPTOP (MISCELLANEOUS) ×3 IMPLANT
WATER STERILE IRR 1000ML POUR (IV SOLUTION) ×3 IMPLANT

## 2015-11-19 NOTE — H&P (Signed)
Date of Initial H&P: 11/18/15  History reviewed, patient examined, no change in status, stable for surgery.

## 2015-11-19 NOTE — Anesthesia Procedure Notes (Signed)
Procedure Name: Intubation Date/Time: 11/19/2015 7:30 AM Performed by: Stormy FabianURTIS, Tevyn Codd Pre-anesthesia Checklist: Patient identified, Patient being monitored, Timeout performed, Emergency Drugs available and Suction available Patient Re-evaluated:Patient Re-evaluated prior to inductionOxygen Delivery Method: Circle system utilized Preoxygenation: Pre-oxygenation with 100% oxygen Intubation Type: IV induction Ventilation: Mask ventilation without difficulty Laryngoscope Size: Mac and 3 Grade View: Grade II Tube type: Oral Tube size: 7.5 mm Number of attempts: 1 Airway Equipment and Method: Stylet Placement Confirmation: ETT inserted through vocal cords under direct vision,  positive ETCO2 and breath sounds checked- equal and bilateral Secured at: 24 cm Tube secured with: Tape Dental Injury: Teeth and Oropharynx as per pre-operative assessment

## 2015-11-19 NOTE — Anesthesia Preprocedure Evaluation (Signed)
Anesthesia Evaluation  Patient identified by MRN, date of birth, ID band Patient awake    Reviewed: Allergy & Precautions, NPO status , Patient's Chart, lab work & pertinent test results  History of Anesthesia Complications Negative for: history of anesthetic complications  Airway Mallampati: III  TM Distance: >3 FB Neck ROM: Full    Dental no notable dental hx.    Pulmonary sleep apnea , neg COPD,    breath sounds clear to auscultation- rhonchi (-) wheezing      Cardiovascular Exercise Tolerance: Good (-) hypertension(-) CAD and (-) Past MI  Rhythm:Regular Rate:Normal - Systolic murmurs and - Diastolic murmurs    Neuro/Psych Chronic pain negative psych ROS   GI/Hepatic negative GI ROS, Neg liver ROS,   Endo/Other  diabetes, Type 2, Oral Hypoglycemic Agents, Insulin Dependent  Renal/GU Renal disease: nephrolithiasis.     Musculoskeletal  (+) Arthritis ,   Abdominal (+) + obese,   Peds  Hematology negative hematology ROS (+)   Anesthesia Other Findings Past Medical History: 10/22/2014: Arthritis, senescent No date: Back pain     Comment: history spinal stenosis, DDD 10/22/2014: Chronic pain syndrome No date: Diabetes mellitus without complication (HCC) No date: History of kidney stones No date: IBS (irritable bowel syndrome) No date: Lateral meniscus tear     Comment: rt knee No date: Preventative health care     Comment: takes crestor/ altace for "preventative               reasons" denies heart or BP problems No date: Sleep apnea     Comment: had UPPP surgery and now sleep apnea resolved No date: Spinal stenosis   Reproductive/Obstetrics                             Anesthesia Physical Anesthesia Plan  ASA: III  Anesthesia Plan: General   Post-op Pain Management:    Induction: Intravenous  Airway Management Planned: Oral ETT  Additional Equipment:   Intra-op Plan:    Post-operative Plan: Extubation in OR  Informed Consent: I have reviewed the patients History and Physical, chart, labs and discussed the procedure including the risks, benefits and alternatives for the proposed anesthesia with the patient or authorized representative who has indicated his/her understanding and acceptance.   Dental advisory given  Plan Discussed with: CRNA and Anesthesiologist  Anesthesia Plan Comments:         Anesthesia Quick Evaluation

## 2015-11-19 NOTE — Transfer of Care (Signed)
Immediate Anesthesia Transfer of Care Note  Patient: Adam BergFranklin W Wuellner Jr.  Procedure(s) Performed: Procedure(s): URETEROSCOPY WITH RETROGRADE PYELOGRAM (Right)  Patient Location: PACU  Anesthesia Type:General  Level of Consciousness: sedated  Airway & Oxygen Therapy: Patient Spontanous Breathing and Patient connected to face mask oxygen  Post-op Assessment: Report given to RN and Post -op Vital signs reviewed and stable  Post vital signs: Reviewed and stable  Last Vitals:  Vitals:   11/19/15 0622 11/19/15 0822  BP: 121/90 (!) 141/90  Pulse: 78 96  Resp: 16 20  Temp: 36.2 C 36.7 C    Complications: No apparent anesthesia complications

## 2015-11-19 NOTE — Discharge Instructions (Addendum)
AMBULATORY SURGERY  DISCHARGE INSTRUCTIONS   1) The drugs that you were given will stay in your system until tomorrow so for the next 24 hours you should not:  A) Drive an automobile B) Make any legal decisions C) Drink any alcoholic beverage   2) You may resume regular meals tomorrow.  Today it is better to start with liquids and gradually work up to solid foods.  You may eat anything you prefer, but it is better to start with liquids, then soup and crackers, and gradually work up to solid foods.   3) Please notify your doctor immediately if you have any unusual bleeding, trouble breathing, redness and pain at the surgery site, drainage, fever, or pain not relieved by medication. 4)   5) Your post-operative visit with Dr.                                     is: Date:                        Time:    Please call to schedule your post-operative visit.  6) Additional Instructions:     Dietary Guidelines to Help Prevent Kidney Stones Your risk of kidney stones can be decreased by adjusting the foods you eat. The most important thing you can do is drink enough fluid. You should drink enough fluid to keep your urine clear or pale yellow. The following guidelines provide specific information for the type of kidney stone you have had. GUIDELINES ACCORDING TO TYPE OF KIDNEY STONE Calcium Oxalate Kidney Stones  Reduce the amount of salt you eat. Foods that have a lot of salt cause your body to release excess calcium into your urine. The excess calcium can combine with a substance called oxalate to form kidney stones.  Reduce the amount of animal protein you eat if the amount you eat is excessive. Animal protein causes your body to release excess calcium into your urine. Ask your dietitian how much protein from animal sources you should be eating.  Avoid foods that are high in oxalates. If you take vitamins, they should have less than 500 mg of vitamin C. Your body turns vitamin C into  oxalates. You do not need to avoid fruits and vegetables high in vitamin C. Calcium Phosphate Kidney Stones  Reduce the amount of salt you eat to help prevent the release of excess calcium into your urine.  Reduce the amount of animal protein you eat if the amount you eat is excessive. Animal protein causes your body to release excess calcium into your urine. Ask your dietitian how much protein from animal sources you should be eating.  Get enough calcium from food or take a calcium supplement (ask your dietitian for recommendations). Food sources of calcium that do not increase your risk of kidney stones include:  Broccoli.  Dairy products, such as cheese and yogurt.  Pudding. Uric Acid Kidney Stones  Do not have more than 6 oz of animal protein per day. FOOD SOURCES Animal Protein Sources  Meat (all types).  Poultry.  Eggs.  Fish, seafood. Foods High in MirantSalt  Salt seasonings.  Soy sauce.  Teriyaki sauce.  Cured and processed meats.  Salted crackers and snack foods.  Fast food.  Canned soups and most canned foods. Foods High in Oxalates  Grains:  Amaranth.  Barley.  Grits.  Wheat germ.  Bran.  Buckwheat flour.  All bran cereals.  Pretzels.  Whole wheat bread.  Vegetables:  Beans (wax).  Beets and beet greens.  Collard greens.  Eggplant.  Escarole.  Leeks.  Okra.  Parsley.  Rutabagas.  Spinach.  Swiss chard.  Tomato paste.  Fried potatoes.  Sweet potatoes.  Fruits:  Red currants.  Figs.  Kiwi.  Rhubarb.  Meat and Other Protein Sources:  Beans (dried).  Soy burgers and other soybean products.  Miso.  Nuts (peanuts, almonds, pecans, cashews, hazelnuts).  Nut butters.  Sesame seeds and tahini (paste made of sesame seeds).  Poppy seeds.  Beverages:  Chocolate drink mixes.  Soy milk.  Instant iced tea.  Juices made from high-oxalate fruits or  vegetables.  Other:  Carob.  Chocolate.  Fruitcake.  Marmalades.   This information is not intended to replace advice given to you by your health care provider. Make sure you discuss any questions you have with your health care provider.   Document Released: 04/30/2010 Document Revised: 01/08/2013 Document Reviewed: 11/30/2012 Elsevier Interactive Patient Education [employer]2016 [employer]

## 2015-11-19 NOTE — Op Note (Signed)
Preoperative diagnosis: Right hydronephrosis and flank pain  Postoperative diagnosis: 1. Passed urinary calculus                                              2. Musculoskeletal back pain  Procedure: 1. Right retrograde pyelogram                      2. Right ureteroscopy                      3. Fluoroscopy    Surgeon: Suszanne ConnersMichael R. Evelene CroonWolff MD  Anesthesia: General  Indications:See the history and physical. After informed consent the above procedure(s) were requested     Technique and findings: After adequate general anesthesia been obtained the patient was placed into dorsolithotomy position and the perineum was prepped and draped in the usual fashion. The 8021 French the scope was coupled to the camera and then visually advanced into the bladder. The bladder was thoroughly inspected. No bladder tumors were identified. Both ureteral orifices were identified and had clear reflux. A 6 French open-ended ureteral catheter was then placed into the right ureteral orifice and retrograde pyelography performed. Static and fluoroscopic images did not reveal any filling defects or stones. Drainage images revealed prompt drainage of the contrast. At this point a 0.035 Glidewire was placed into the right ureter and passed up to the renal pelvis. The cystic scope was removed taking care leaving the guidewire in position. The short mini ureteroscope was then visually advanced up the right ureter to the level of the renal pelvis. No tumors or stones were identified. The ureteroscope and guidewire were then removed.  10 cc of viscous Xylocaine was instilled within the urethra. A B&O suppository was placed. The procedure was then terminated and patient transferred to the recovery room in stable condition.

## 2015-11-19 NOTE — Anesthesia Postprocedure Evaluation (Signed)
Anesthesia Post Note  Patient: Adam Petty.  Procedure(s) Performed: Procedure(s) (LRB): URETEROSCOPY WITH RETROGRADE PYELOGRAM (Right)  Patient location during evaluation: PACU Anesthesia Type: General Level of consciousness: awake and alert and oriented Pain management: pain level controlled Vital Signs Assessment: post-procedure vital signs reviewed and stable Respiratory status: spontaneous breathing, nonlabored ventilation and respiratory function stable Cardiovascular status: blood pressure returned to baseline and stable Postop Assessment: no signs of nausea or vomiting Anesthetic complications: no    Last Vitals:  Vitals:   11/19/15 0838 11/19/15 0852  BP: (!) 129/97 (!) 124/97  Pulse: 91 91  Resp: 17 19  Temp:      Last Pain:  Vitals:   11/19/15 0907  TempSrc:   PainSc: 5                  Roderick Calo

## 2015-11-24 ENCOUNTER — Other Ambulatory Visit: Payer: Self-pay | Admitting: Urology

## 2015-11-24 ENCOUNTER — Encounter: Payer: Self-pay | Admitting: Pain Medicine

## 2015-11-24 DIAGNOSIS — M5489 Other dorsalgia: Secondary | ICD-10-CM

## 2015-11-24 DIAGNOSIS — R109 Unspecified abdominal pain: Secondary | ICD-10-CM

## 2015-12-02 ENCOUNTER — Encounter: Payer: Self-pay | Admitting: Pain Medicine

## 2015-12-04 ENCOUNTER — Ambulatory Visit: Payer: BLUE CROSS/BLUE SHIELD

## 2015-12-16 ENCOUNTER — Ambulatory Visit
Admission: RE | Admit: 2015-12-16 | Discharge: 2015-12-16 | Disposition: A | Discharge status: Home / Self Care | Payer: BLUE CROSS/BLUE SHIELD | Source: Ambulatory Visit | Attending: Pain Medicine | Admitting: Pain Medicine

## 2015-12-16 ENCOUNTER — Encounter: Payer: Self-pay | Admitting: Pain Medicine

## 2015-12-16 ENCOUNTER — Ambulatory Visit: Payer: Worker's Compensation | Attending: Pain Medicine | Admitting: Pain Medicine

## 2015-12-16 VITALS — BP 127/84 | HR 76 | Resp 18 | Ht 66.0 in | Wt 245.0 lb

## 2015-12-16 DIAGNOSIS — G8929 Other chronic pain: Secondary | ICD-10-CM

## 2015-12-16 DIAGNOSIS — M1288 Other specific arthropathies, not elsewhere classified, other specified site: Secondary | ICD-10-CM | POA: Insufficient documentation

## 2015-12-16 DIAGNOSIS — Z9889 Other specified postprocedural states: Secondary | ICD-10-CM | POA: Diagnosis not present

## 2015-12-16 DIAGNOSIS — M545 Low back pain, unspecified: Secondary | ICD-10-CM

## 2015-12-16 DIAGNOSIS — M47816 Spondylosis without myelopathy or radiculopathy, lumbar region: Secondary | ICD-10-CM | POA: Diagnosis not present

## 2015-12-16 DIAGNOSIS — G894 Chronic pain syndrome: Secondary | ICD-10-CM | POA: Insufficient documentation

## 2015-12-16 MED ORDER — ROPIVACAINE HCL 2 MG/ML IJ SOLN: 9.0000 mL | Freq: Once | INTRAMUSCULAR | Status: DC

## 2015-12-16 MED ORDER — FENTANYL CITRATE (PF) 100 MCG/2ML IJ SOLN
INTRAMUSCULAR | Status: AC
  Administered 2015-12-16: 50 ug via INTRAVENOUS
  Filled 2015-12-16: qty 2

## 2015-12-16 MED ORDER — LACTATED RINGERS IV SOLN: 1000.0000 mL | Freq: Once | INTRAVENOUS | Status: DC

## 2015-12-16 MED ORDER — TRIAMCINOLONE ACETONIDE 40 MG/ML IJ SUSP
INTRAMUSCULAR | Status: AC
  Administered 2015-12-16: 40 mg
  Filled 2015-12-16: qty 2

## 2015-12-16 MED ORDER — OXYCODONE HCL 5 MG PO TABS: 5.0000 mg | ORAL_TABLET | Freq: Four times a day (QID) | ORAL | 0 refills | Status: DC | PRN

## 2015-12-16 MED ORDER — MIDAZOLAM HCL 5 MG/5ML IJ SOLN
INTRAMUSCULAR | Status: AC
  Administered 2015-12-16: 10:00:00 via INTRAVENOUS
  Filled 2015-12-16: qty 5

## 2015-12-16 MED ORDER — FENTANYL CITRATE (PF) 100 MCG/2ML IJ SOLN: 25.0000 ug | INTRAMUSCULAR | Status: DC | PRN

## 2015-12-16 MED ORDER — TRIAMCINOLONE ACETONIDE 40 MG/ML IJ SUSP
40.0000 mg | Freq: Once | INTRAMUSCULAR | Status: DC
Start: 2015-12-16 — End: 2016-02-09

## 2015-12-16 MED ORDER — ROPIVACAINE HCL 2 MG/ML IJ SOLN
INTRAMUSCULAR | Status: AC
  Administered 2015-12-16: 10:00:00
  Filled 2015-12-16: qty 20

## 2015-12-16 MED ORDER — TRIAMCINOLONE ACETONIDE 40 MG/ML IJ SUSP: 40.0000 mg | Freq: Once | INTRAMUSCULAR | Status: DC

## 2015-12-16 MED ORDER — MIDAZOLAM HCL 5 MG/5ML IJ SOLN: 1.0000 mg | INTRAMUSCULAR | Status: DC | PRN

## 2015-12-16 MED ORDER — LIDOCAINE HCL (PF) 1 % IJ SOLN: 10.0000 mL | Freq: Once | INTRAMUSCULAR | Status: DC

## 2015-12-16 NOTE — Progress Notes (Signed)
Safety precautions to be maintained throughout the outpatient stay will include: orient to surroundings, keep bed in low position, maintain call bell within reach at all times, provide assistance with transfer out of bed and ambulation.  

## 2015-12-16 NOTE — Progress Notes (Signed)
Patient's Name: Adam BergFranklin W Isenberg Jr.  MRN: [id number]  Referring Provider: No ref. provider found  DOB: 02/06/1960  PCP: No Pcp Per Patient  DOS: 12/16/2015  Note by: Haileyann Staiger A. Laban EmperorNaveira, MD  Service setting: Ambulatory outpatient  Location: [facility] (AMB) Pain Management Facility  Visit type: Procedure  Specialty: Interventional Pain Management  Patient type: Established   Primary Reason for Visit: Interventional Pain Management Treatment. CC: Back Pain (lower right)  Procedure:  Anesthesia, Analgesia, Anxiolysis:  Type: Diagnostic Medial Branch Facet Block Region: Lumbar Level: L2, L3, L4, L5, & S1 Medial Branch Level(s) Laterality: Bilateral  Type: Local Anesthesia with Moderate (Conscious) Sedation Local Anesthetic: Lidocaine 1% Route: Intravenous (IV) IV Access: Secured Sedation: Meaningful verbal contact was maintained at all times during the procedure  Indication(s): Analgesia and Anxiety  Indications: 1. Lumbar facet syndrome (WC injury) (Bilateral) (R>L)   2. Lumbar spondylosis   3. Chronic low back pain (WC injury) (Location of Primary Source of Pain) (Bilateral) (R>L)   4. Chronic pain syndrome    Pain Score: Pre-procedure: 7 /10 Post-procedure: 3 /10  Pre-Procedure Assessment:  Adam Petty is a 55 y.o. (year old), male patient, seen today for interventional treatment. He  has a past surgical history that includes Tonsillectomy; Shoulder surgery; Palate surgery; Cervical fusion; Foot surgery; Lumbar laminectomy/decompression microdiscectomy (N/A, 02/06/2013); Knee arthroscopy (Right, 10/02/2013); Knee arthroscopy with medial menisectomy (Right, 03/25/2015); Knee bursectomy (Right, 03/25/2015); and Cystoscopy w/ retrogrades (Right, 11/19/2015).. His primarily concern today is the Back Pain (lower right) The primary encounter diagnosis was Lumbar facet syndrome (WC injury) (Bilateral) (R>L). Diagnoses of Lumbar spondylosis, Chronic low back pain (WC injury) (Location of Primary Source  of Pain) (Bilateral) (R>L), and Chronic pain syndrome were also pertinent to this visit.  Pain Type: Chronic pain Pain Location: Back Pain Orientation: Lower, Right Pain Descriptors / Indicators: Constant, Sharp, Shooting Pain Frequency: Constant  Date of Last Visit: 11/04/15 Service Provided on Last Visit: Med Refill, Evaluation (post procedure)  Coagulation Parameters Lab Results  Component Value Date   PLT 176 03/13/2015   Verification of the correct person, correct site (including marking of site), and correct procedure were performed and confirmed by the patient.  Consent: Before the procedure and under the influence of no sedative(s), amnesic(s), or anxiolytics, the patient was informed of the treatment options, risks and possible complications. To fulfill our ethical and legal obligations, as recommended by the American Medical Association's Code of Ethics, I have informed the patient of my clinical impression; the nature and purpose of the treatment or procedure; the risks, benefits, and possible complications of the intervention; the alternatives, including doing nothing; the risk(s) and benefit(s) of the alternative treatment(s) or procedure(s); and the risk(s) and benefit(s) of doing nothing. The patient was provided information about the general risks and possible complications associated with the procedure. These may include, but are not limited to: failure to achieve desired goals, infection, bleeding, organ or nerve damage, allergic reactions, paralysis, and death. In addition, the patient was informed of those risks and complications associated to Spine-related procedures, such as failure to decrease pain; infection (i.e.: Meningitis, epidural or intraspinal abscess); bleeding (i.e.: epidural hematoma, subarachnoid hemorrhage, or any other type of intraspinal or peri-dural bleeding); organ or nerve damage (i.e.: Any type of peripheral nerve, nerve root, or spinal cord injury) with  subsequent damage to sensory, motor, and/or autonomic systems, resulting in permanent pain, numbness, and/or weakness of one or several areas of the body; allergic reactions; (i.e.: anaphylactic reaction); and/or death.  Furthermore, the patient was informed of those risks and complications associated with the medications. These include, but are not limited to: allergic reactions (i.e.: anaphylactic or anaphylactoid reaction(s)); adrenal axis suppression; blood sugar elevation that in diabetics may result in ketoacidosis or comma; water retention that in patients with history of congestive heart failure may result in shortness of breath, pulmonary edema, and decompensation with resultant heart failure; weight gain; swelling or edema; medication-induced neural toxicity; particulate matter embolism and blood vessel occlusion with resultant organ, and/or nervous system infarction; and/or aseptic necrosis of one or more joints. Finally, the patient was informed that Medicine is not an exact science; therefore, there is also the possibility of unforeseen or unpredictable risks and/or possible complications that may result in a catastrophic outcome. The patient indicated having understood very clearly. We have given the patient no guarantees and we have made no promises. Enough time was given to the patient to ask questions, all of which were answered to the patient's satisfaction. Adam Petty has indicated that he wanted to continue with the procedure.  Consent Attestation: I, the ordering provider, attest that I have discussed with the patient the benefits, risks, side-effects, alternatives, likelihood of achieving goals, and potential problems during recovery for the procedure that I have provided informed consent.  Pre-Procedure Preparation:  Safety Precautions: Allergies reviewed. The patient was asked about blood thinners, or active infections, both of which were denied. The patient was asked to confirm the  procedure and laterality, before marking the site, and again before commencing the procedure. Appropriate site, procedure, and patient were confirmed by following the Joint Commission's Universal Protocol (UP.01.01.01), in the form of a "Time Out". The patient was asked to participate by confirming the accuracy of the "Time Out" information. Patient was assessed for positional comfort and pressure points before starting the procedure. Allergies: He has No Known Allergies. Allergy Precautions: None required Infection Control Precautions: Sterile technique used. Standard Universal Precautions were taken as recommended by the Department of [facility] for Disease Control and Prevention (CDC). Standard pre-surgical skin prep was conducted. Respiratory hygiene and cough etiquette was practiced. Hand hygiene observed. Safe injection practices and needle disposal techniques followed. SDV (single dose vial) medications used. Medications properly checked for expiration dates and contaminants. Personal protective equipment (PPE) used as per protocol. Monitoring:  As per clinic protocol. Vitals:   12/16/15 0947 12/16/15 1000 12/16/15 1009 12/16/15 1015  BP: 132/89 131/89 (!) 123/94 127/84  Pulse: 83 80 81 76  Resp: 16 18 16 18   SpO2: 97% 96% 98% 98%  Weight:      Height:      Calculated BMI: Body mass index is 39.54 kg/m. Time-out: "Time-out" completed before starting procedure, as per protocol.  Description of Procedure Process:   Time-out: "Time-out" completed before starting procedure, as per protocol. Position: Prone Target Area: For Lumbar Facet blocks, the target is the groove formed by the junction of the transverse process and superior articular process. For the L5 dorsal ramus, the target is the notch between superior articular process and sacral ala. For the S1 dorsal ramus, the target is the superior and lateral edge of the posterior S1 Sacral foramen. Approach: Paramedial  approach. Area Prepped: Entire Posterior Lumbosacral Region Prepping solution: ChloraPrep (2% chlorhexidine gluconate and 70% isopropyl alcohol) Safety Precautions: Aspiration looking for blood return was conducted prior to all injections. At no point did we inject any substances, as a needle was being advanced. No attempts were made at seeking any paresthesias.  Safe injection practices and needle disposal techniques used. Medications properly checked for expiration dates. SDV (single dose vial) medications used. Description of the Procedure: Protocol guidelines were followed. The patient was placed in position over the fluoroscopy table. The target area was identified and the area prepped in the usual manner. Skin desensitized using vapocoolant spray. Skin & deeper tissues infiltrated with local anesthetic. Appropriate amount of time allowed to pass for local anesthetics to take effect. The procedure needle was introduced through the skin, ipsilateral to the reported pain, and advanced to the target area. Employing the "Medial Branch Technique", the needles were advanced to the angle made by the superior and medial portion of the transverse process, and the lateral and inferior portion of the superior articulating process of the targeted vertebral bodies. This area is known as "Burton's Eye" or the "Eye of the [place] Dog". A procedure needle was introduced through the skin, and this time advanced to the angle made by the superior and medial border of the sacral ala, and the lateral border of the S1 vertebral body. This last needle was later repositioned at the superior and lateral border of the posterior S1 foramen. Negative aspiration confirmed. Solution injected in intermittent fashion, asking for systemic symptoms every 0.5cc of injectate. The needles were then removed and the area cleansed, making sure to leave some of the prepping solution back to take advantage of its long term bactericidal  properties. EBL: None Materials & Medications Used:  Needle(s) Used: 22g - 5" Spinal Needle(s)   Illustration of the posterior view of the lumbar spine and the posterior neural structures. Laminae of L2 through S1 are labeled. DPRL5, dorsal primary ramus of L5; DPRS1, dorsal primary ramus of S1; DPR3, dorsal primary ramus of L3; FJ, facet (zygapophyseal) joint L3-L4; I, inferior articular process of L4; LB1, lateral branch of dorsal primary ramus of L1; IAB, inferior articular branches from L3 medial branch (supplies L4-L5 facet joint); IBP, intermediate branch plexus; MB3, medial branch of dorsal primary ramus of L3; NR3, third lumbar nerve root; S, superior articular process of L5; SAB, superior articular branches from L4 (supplies L4-5 facet joint also); TP3, transverse process of L3.  Imaging Guidance (Spinal):  Type of Imaging Technique: Fluoroscopy Guidance (Spinal) Indication(s): Assistance in needle guidance and placement for procedures requiring needle placement in or near specific anatomical locations not easily accessible without such assistance. Exposure Time: Please see nurses notes. Contrast: None used. Fluoroscopic Guidance: I was personally present during the use of fluoroscopy. "Tunnel Vision Technique" used to obtain the best possible view of the target area. Parallax error corrected before commencing the procedure. "Direction-depth-direction" technique used to introduce the needle under continuous pulsed fluoroscopy. Once target was reached, antero-posterior, oblique, and lateral fluoroscopic projection used confirm needle placement in all planes. Images permanently stored in EMR. Interpretation: No contrast injected. I personally interpreted the imaging intraoperatively. Adequate needle placement confirmed in multiple planes. Permanent images saved into the patient's record.  Antibiotic Prophylaxis:  Indication(s): No indications identified. Type:  Antibiotics Given (last 72  hours)    None      Post-operative Assessment:  Complications: No immediate post-treatment complications observed by team, or reported by patient. Disposition: The patient tolerated the entire procedure well. A repeat set of vitals were taken after the procedure and the patient was kept under observation following institutional policy, for this type of procedure. Post-procedural neurological assessment was performed, showing return to baseline, prior to discharge. The patient was provided with post-procedure discharge instructions,  including a section on how to identify potential problems. Should any problems arise concerning this procedure, the patient was given instructions to immediately contact us, at any time, without hesitation. In any case, we plan to contact the patient by telephone for a follow-up status report regarding this interventional procedure. Comments:  No additional relevant information.  Plan of Care  Discharge to: Discharge home  Medications ordered for procedure: Meds ordered this encounter  Medications  . ropivacaine (PF) 2 mg/mL (0.2%) (NAROPIN) 2 MG/ML injection    Lowell GuitarPowell, Patti: cabinet override  . triamcinolone acetonide (KENALOG-40) 40 MG/ML injection    Lowell GuitarPowell, Patti: cabinet override  . midazolam (VERSED) 5 MG/5ML injection    Lowell GuitarPowell, Patti: cabinet override  . fentaNYL (SUBLIMAZE) 100 MCG/2ML injection    Lowell GuitarPowell, Patti: cabinet override  . oxyCODONE (OXY IR/ROXICODONE) 5 MG immediate release tablet    Sig: Take 1 tablet (5 mg total) by mouth every 6 (six) hours as needed for severe pain.    Dispense:  120 tablet    Refill:  0    Do not place this medication, or any other prescription from our practice, on "Automatic Refill". Patient may have prescription filled one day early if pharmacy is closed on scheduled refill date. Do not fill until: 01/13/16 To last until: 02/12/16  . fentaNYL (SUBLIMAZE) injection 25-50 mcg    Make sure Narcan is available in the  pyxis when using this medication. In the event of respiratory depression (RR< 8/min): Titrate NARCAN (naloxone) in increments of 0.1 to 0.2 mg IV at 2-3 minute intervals, until desired degree of reversal.  . lactated ringers infusion 1,000 mL  . midazolam (VERSED) 5 MG/5ML injection 1-2 mg    Make sure Flumazenil is available in the pyxis when using this medication. If oversedation occurs, administer 0.2 mg IV over 15 sec. If after 45 sec no response, administer 0.2 mg again over 1 min; may repeat at 1 min intervals; not to exceed 4 doses (1 mg)  . triamcinolone acetonide (KENALOG-40) injection 40 mg  . lidocaine (PF) (XYLOCAINE) 1 % injection 10 mL  . ropivacaine (PF) 2 mg/mL (0.2%) (NAROPIN) injection 9 mL  . triamcinolone acetonide (KENALOG-40) injection 40 mg  . ropivacaine (PF) 2 mg/mL (0.2%) (NAROPIN) injection 9 mL   Medications administered: (For more details, see medical record) We administered ropivacaine (PF) 2 mg/mL (0.2%), triamcinolone acetonide, midazolam, and fentaNYL. Lab-work, Procedure(s), & Referral(s) Ordered: Orders Placed This Encounter  Procedures  . LUMBAR FACET(MEDIAL BRANCH NERVE BLOCK) MBNB  . DG C-Arm 1-60 Min-No Report   Imaging Ordered: No results found for this or any previous visit. New Prescriptions   No medications on file   Physician-requested Follow-up:  Return in 7 weeks (on 02/04/2016) for Post-Procedure evaluation.  Future Appointments Date Time Provider Department Center  02/04/2016 8:00 AM Delano MetzFrancisco Safire Gordin, [facility] None   Primary Care Physician: No Pcp Per Patient [facility] Note by: Sydnee LevansFrancisco A. Laban EmperorNaveira, M.D, DABA, DABAPM, DABPM, DABIPP, FIPP  Disclaimer:  Medicine is not an exact science. The only guarantee in medicine is that nothing is guaranteed. It is important to note that the decision to proceed with this intervention was based on the information collected from the patient. The Data and  conclusions were drawn from the patient's questionnaire, the interview, and the physical examination. Because the information was provided in large part by the patient, it cannot be guaranteed that it has not been purposely or unconsciously manipulated. Every effort  has been made to obtain as much relevant data as possible for this evaluation. It is important to note that the conclusions that lead to this procedure are derived in large part from the available data. Always take into account that the treatment will also be dependent on availability of resources and existing treatment guidelines, considered by other Pain Management Practitioners as being common knowledge and practice, at the time of the intervention. For Medico-Legal purposes, it is also important to point out that variation in procedural techniques and pharmacological choices are the acceptable norm. The indications, contraindications, technique, and results of the above procedure should only be interpreted and judged by a Board-Certified Interventional Pain Specialist with extensive familiarity and expertise in the same exact procedure and technique. Attempts at providing opinions without similar or greater experience and expertise than that of the treating physician will be considered as inappropriate and unethical, and shall result in a formal complaint to the state medical board and applicable specialty societies.  Instructions provided at this appointment: Patient Instructions  Pain Management Discharge Instructions  General Discharge Instructions :  If you need to reach your doctor call: Monday-Friday 8:00 am - 4:00 pm at [phone] or toll free [phone].  After clinic hours [phone] to have operator reach doctor.  Bring all of your medication bottles to all your appointments in the pain clinic.  To cancel or reschedule your appointment with Pain Management please remember to call 24 hours in advance to avoid a  fee.  Refer to the educational materials which you have been given on: General Risks, I had my Procedure. Discharge Instructions, Post Sedation.  Post Procedure Instructions:  The drugs you were given will stay in your system until tomorrow, so for the next 24 hours you should not drive, make any legal decisions or drink any alcoholic beverages.  You may eat anything you prefer, but it is better to start with liquids then soups and crackers, and gradually work up to solid foods.  Please notify your doctor immediately if you have any unusual bleeding, trouble breathing or pain that is not related to your normal pain.  Depending on the type of procedure that was done, some parts of your body may feel week and/or numb.  This usually clears up by tonight or the next day.  Walk with the use of an assistive device or accompanied by an adult for the 24 hours.  You may use ice on the affected area for the first 24 hours.  Put ice in a Ziploc bag and cover with a towel and place against area 15 minutes on 15 minutes off.  You may switch to heat after 24 hours.

## 2015-12-16 NOTE — Patient Instructions (Signed)

## 2015-12-17 ENCOUNTER — Telehealth: Payer: Self-pay | Admitting: *Deleted

## 2015-12-17 NOTE — Telephone Encounter (Signed)
LVM for patient to return call for any concerns post procedure. 

## 2016-02-04 ENCOUNTER — Ambulatory Visit: Payer: Worker's Compensation | Admitting: Pain Medicine

## 2016-02-09 DIAGNOSIS — G894 Chronic pain syndrome: Secondary | ICD-10-CM | POA: Insufficient documentation

## 2016-02-09 NOTE — Progress Notes (Signed)
Patient's Name: Adam Petty.  MRN: [id number]  Referring Provider: No ref. provider found  DOB: [birth year]  PCP: No Pcp Per Patient  DOS: 02/10/2016  Note by: Ashtan Girtman A. Dossie Arbour, MD  Service setting: Ambulatory outpatient  Specialty: Interventional Pain Management  Location: [facility] (AMB) Pain Management Facility    Patient type: Established   Primary Reason(s) for Visit: Encounter for prescription drug management & post-procedure evaluation of chronic illness with mild to moderate exacerbation(Level of risk: moderate) CC: Back Pain (lower more on the right); Knee Pain (right); and Shoulder Pain (right)  HPI  Adam Petty is a 57 y.o. year old, male patient, who comes today for a post-procedure evaluation and medication management. 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); Discogenic syndrome, lumbar (WC injury); Osteoarthritis of spine with radiculopathy, lumbar region (WC injury); Lumbar facet syndrome (WC injury) (Bilateral) (R>L); Lumbosacral radiculopathy (WC injury); Pain in right knee (WC injury); Obesity, Class III, BMI 40-49.9 (morbid obesity) ([place]); Uncomplicated opioid dependence ([place]); Therapeutic opioid-induced constipation (OIC); Dyslipidemia; Microalbuminuria; Adiposity; Lumbar canal stenosis; Type 2 diabetes mellitus ([place]); Long term prescription opiate use; Encounter for chronic pain management; Neurogenic pain; Neuropathic pain; Musculoskeletal pain; Lumbar spondylosis; Chronic knee pain (Location of Secondary source of pain) (Right); Osteoarthritis of knee (Location of Secondary source of pain) (Right); Chronic shoulder pain (Location of Tertiary source of pain) (Right); Osteoarthritis of shoulder (Location  of Tertiary source of pain) (Right); Chronic lower extremity pain (Right); Recurrent nephrolithiasis; Acute medial meniscal tear; and Chronic pain syndrome (WC injury) on his problem list. His primarily concern today is the Back Pain (lower more on the right); Knee Pain (right); and Shoulder Pain (right)  Pain Assessment: Self-Reported Pain Score: 6 /10 Clinically the patient looks like a 3/10 Reported level is inconsistent with clinical observations. Information on the proper use of the pain scale provided to the patient today Pain Type: Chronic pain Pain Location: Back Pain Orientation: Lower, Right (knee and shoulder) Pain Descriptors / Indicators: Stabbing, Radiating (shoulder, constant, stabbing with movement, knee, throbbing, painful to the touch) Pain Frequency: Constant  Adam Petty was last seen on 12/16/2015 for a procedure. During today's appointment we reviewed Adam Petty post-procedure results, as well as his outpatient medication regimen.  Further details on both, my assessment(s), as well as the proposed treatment plan, please see below.  Controlled Substance Pharmacotherapy Assessment REMS (Risk Evaluation and Mitigation Strategy)  Analgesic: Oxycodone IR 5 mg 1 tablet by mouth every 6 hours when necessary for pain (20 mg/day) MME/day:30 mg/day  Janett Billow, RN  02/10/2016  8:32 AM  Sign at close encounter Nursing Pain Medication Assessment:  Safety precautions to be maintained throughout the outpatient stay will include: orient to surroundings, keep bed in low position, maintain call bell within reach at all times, provide assistance with transfer out of bed and ambulation.  Medication Inspection Compliance: Pill count conducted under aseptic conditions, in front of the patient. Neither the pills nor the bottle was removed from the patient's sight at any time. Once count was completed pills were immediately returned to the patient in their original  bottle.  Medication: See above Pill Count: 120 of 120 pills remain Bottle Appearance: Standard pharmacy container. Clearly labeled. Filled Date: 01 / 21 / 2017  Medication last intake:02/10/16 @ 0630   Pharmacokinetics: Liberation and absorption (onset of action): WNL Distribution (time to peak effect): WNL Metabolism and excretion (duration of action): WNL         Pharmacodynamics: Desired effects: Analgesia: Adam Petty reports >50% benefit. Functional ability: Patient reports that medication allows him to accomplish basic ADLs Clinically meaningful improvement in function (CMIF): Sustained CMIF goals met Perceived effectiveness: Described as relatively effective, allowing for increase in activities of daily living (ADL) Undesirable effects: Side-effects or Adverse reactions: None reported Monitoring: [place] PMP: Online review of the past 64-monthperiod conducted. Compliant with practice rules and regulations List of all UDS test(s) done:  Lab Results  Component Value Date   TOXASSSELUR FINAL 03/12/2015   TOXASSSELUR FINAL 12/22/2014   Last UDS on record: ToxAssure Select 13  Date Value Ref Range Status  03/12/2015 FINAL  Final    Comment:    ==================================================================== TOXASSURE SELECT 13 (MW) ==================================================================== Test                             Result       Flag       Units Drug Present and Declared for Prescription Verification   Oxycodone                      892          EXPECTED   ng/mg creat   Oxymorphone                    524          EXPECTED   ng/mg creat   Noroxycodone                   1363         EXPECTED   ng/mg creat   Noroxymorphone                 178          EXPECTED   ng/mg creat    Sources of oxycodone are scheduled prescription medications.    Oxymorphone, noroxycodone, and noroxymorphone are expected    metabolites of oxycodone. Oxymorphone is also available as  a    scheduled prescription medication. ==================================================================== Test                      Result    Flag   Units      Ref Range   Creatinine              49               mg/dL      >=20 ==================================================================== Declared Medications:  The flagging and interpretation on this report are based on the  following declared medications.  Unexpected results may arise from  inaccuracies in the declared medications.  **Note: The testing scope of this panel includes these medications:  Oxycodone  **Note: The testing scope of this panel does not include following  reported medications:  Bisacodyl  Canagliflozin (Invokamet)  Gabapentin  Insulin (NovoLog)  Liraglutide (Victoza)  Metformin (Invokamet)  Ramipril  Rosuvastatin  Supplement ==================================================================== For clinical consultation, please call ([phone] ====================================================================    UDS interpretation: Compliant          Medication Assessment Form: Reviewed. Patient indicates being compliant with therapy Treatment compliance: Compliant Risk Assessment Profile: Aberrant behavior: See prior evaluations. None  observed or detected today Comorbid factors increasing risk of overdose: See prior notes. No additional risks detected today Risk of substance use disorder (SUD): Low Opioid Risk Tool (ORT) Total Score: 10  Interpretation Table:  Score <3 = Low Risk for SUD  Score between 4-7 = Moderate Risk for SUD  Score >8 = High Risk for Opioid Abuse   Risk Mitigation Strategies:  Patient Counseling: Covered Patient-Prescriber Agreement (PPA): Present and active  Notification to other healthcare providers: Done  Pharmacologic Plan: No change in therapy, at this time  Post-Procedure Assessment  12/16/2015 Procedure: Diagnostic Bilateral Lumbar Facet Block  under fluoro and IV sedation Post-procedure pain score: 3/10 Pain started at a 7/10 and went down to a 3/10. This represents a 57% decrease. Influential Factors: BMI: 38.16 kg/m Intra-procedural challenges: None observed Assessment challenges: None detected         Post-procedural side-effects, adverse reactions, or complications: None reported Reported issues: None  Sedation: Sedation provided. When no sedatives are used, the analgesic levels obtained are directly associated to the effectiveness of the local anesthetics. However, when sedation is provided, the level of analgesia obtained during the initial 1 hour following the intervention, is believed to be the result of a combination of factors. These factors may include, but are not limited to: 1. The effectiveness of the local anesthetics used. 2. The effects of the analgesic(s) and/or anxiolytic(s) used. 3. The degree of discomfort experienced by the patient at the time of the procedure. 4. The patients ability and reliability in recalling and recording the events. 5. The presence and influence of possible secondary gains and/or psychosocial factors. Reported result: Relief experienced during the 1st hour after the procedure: 60 % (Ultra-Short Term Relief) Interpretative annotation: Analgesia during this period is likely to be Local Anesthetic and/or IV Sedative (Analgesic/Anxiolitic) related.          Effects of local anesthetic: The analgesic effects attained during this period are directly associated to the localized infiltration of local anesthetics and therefore cary significant diagnostic value as to the etiological location, or anatomical origin, of the pain. Expected duration of relief is directly dependent on the pharmacodynamics of the local anesthetic used. Long-acting (4-6 hours) anesthetics used.  Reported result: Relief during the next 4 to 6 hour after the procedure: 70 % (patient states that sharp, stabbing pain was  relieved, there was still some radiating, constant light pain. ) (Short-Term Relief) Interpretative annotation: Complete relief would suggest area to be the source of the pain.          Long-term benefit: Defined as the period of time past the expected duration of local anesthetics. With the possible exception of prolonged sympathetic blockade from the local anesthetics, benefits during this period are typically attributed to, or associated with, other factors such as analgesic sensory neuropraxia, antiinflammatory effects, or beneficial biochemical changes provided by agents other than the local anesthetics Reported result: Extended relief following procedure: 30 % (Long-Term Relief) Interpretative annotation: Good relief. This could suggest inflammation to be a significant component in the etiology to the pain.          Current benefits: Defined as persistent relief that continues at this point in time.   Reported results: Treated area: <50 % Mr. Neuharth reports improvement in function Interpretative annotation: Recurrance of symptoms. This would suggest persistent aggravating factors  Interpretation: Results would suggest a successful diagnostic intervention. The patient has failed to respond to conservative therapies including over-the-counter medications, anti-inflammatories, muscle relaxants, membrane stabilizers, opioids,  physical therapy, modalities such as heat and ice, as well as more invasive techniques such as nerve blocks. Because Mr. [place]and did attain more than 50% relief of the pain during a series of diagnostic blocks conducted in separate occasions, I believe it is medically necessary to proceed with Radiofrequency Ablation, in order to attempt gaining longer relief.  Laboratory Chemistry  Inflammation Markers Lab Results  Component Value Date   ESRSEDRATE 9 03/12/2015   CRP 0.6 03/12/2015   Renal Function Lab Results  Component Value Date   BUN 15 03/12/2015   CREATININE  0.82 03/12/2015   GFRAA >60 03/12/2015   GFRNONAA >60 03/12/2015   Hepatic Function Lab Results  Component Value Date   AST 41 03/12/2015   ALT 55 03/12/2015   ALBUMIN 4.2 03/12/2015   Electrolytes Lab Results  Component Value Date   NA 140 03/12/2015   K 4.2 03/12/2015   CL 105 03/12/2015   CALCIUM 10.0 03/12/2015   MG 1.8 03/12/2015   Pain Modulating Vitamins No results found for: Maralyn Sago [id number], [id number], [id number], 25OHVITD1, 25OHVITD2, 25OHVITD3, VITAMINB12 Coagulation Parameters Lab Results  Component Value Date   PLT 176 03/13/2015   Cardiovascular Lab Results  Component Value Date   HGB 15.3 03/13/2015   HCT 46.4 03/13/2015   Note: Lab results reviewed.  Recent Diagnostic Imaging Review  Dg C-arm 1-60 Min-no Report  Result Date: 01/06/2016 Fluoroscopy was utilized by the requesting physician.  No radiographic interpretation.   Note: Imaging results reviewed.          Meds  The patient has a current medication list which includes the following prescription(s): cyanocobalamin, gabapentin, metformin, multivitamin, novolog flexpen, oxycodone, oxycodone, oxycodone, potassium citrate, ramipril, rosuvastatin, tapentadol, tresiba flextouch, victoza, and vitamin e.  Current Outpatient Prescriptions on File Prior to Visit  Medication Sig  . cyanocobalamin (,VITAMIN B-12,) 1000 MCG/ML injection Inject into the muscle every 30 (thirty) days.   . metFORMIN (GLUCOPHAGE) 1000 MG tablet TAKE 1 TABLET (1,000 MG TOTAL) BY MOUTH 2 (TWO) TIMES DAILY WITH MEALS.  Marland Kitchen NOVOLOG FLEXPEN 100 UNIT/ML FlexPen Inject 0-10 Units into the skin daily as needed for high blood sugar (sliding scale). Reported on 02/24/2015  . potassium citrate (UROCIT-K) 10 MEQ (1080 MG) SR tablet Take 10 mEq by mouth 2 (two) times daily.  . ramipril (ALTACE) 5 MG capsule Take 5 mg by mouth every morning.   . rosuvastatin (CRESTOR) 5 MG tablet Take 5 mg by mouth 3 (three) times a week. Monday Wednesday  Friday  . tapentadol (NUCYNTA) 50 MG tablet Take 50 mg by mouth every 6 (six) hours as needed. As needed for kidney stone pain  . TRESIBA FLEXTOUCH 100 UNIT/ML SOPN INJECT 25 UNITS SUBCUTANEOUSLY ONCE DAILY-AM  . VICTOZA 18 MG/3ML SOPN INJECT 0.3 MLS (1.8 MG TOTAL) SUBCUTANEOUSLY ONCE DAILY-AM  . vitamin E 100 UNIT capsule Take 100 Units by mouth daily.   No current facility-administered medications on file prior to visit.    ROS  Constitutional: Denies any fever or chills Gastrointestinal: No reported hemesis, hematochezia, vomiting, or acute GI distress Musculoskeletal: Denies any acute onset joint swelling, redness, loss of ROM, or weakness Neurological: No reported episodes of acute onset apraxia, aphasia, dysarthria, agnosia, amnesia, paralysis, loss of coordination, or loss of consciousness  Allergies  Mr. Sarr has No Known Allergies.  [place]  Drug: Mr. Costlow  reports that he does not use drugs. Alcohol:  reports that he drinks alcohol. Tobacco:  reports that he has never smoked. He  has never used smokeless tobacco. Medical:  has a past medical history of Arthritis, senescent (10/22/2014); Back pain; Chronic pain syndrome (10/22/2014); Diabetes mellitus without complication ([place]); History of kidney stones; IBS (irritable bowel syndrome); Lateral meniscus tear; Preventative health care; Sleep apnea; and Spinal stenosis. Family: family history includes COPD in his mother; Cancer in his mother; Diabetes in his father; Heart disease in his father and mother.  Past Surgical History:  Procedure Laterality Date  . CERVICAL FUSION    . CYSTOSCOPY W/ RETROGRADES Right 11/19/2015   Procedure: URETEROSCOPY WITH RETROGRADE PYELOGRAM;  Surgeon: Royston Cowper, MD;  Location: [facility] ORS;  Service: Urology;  Laterality: Right;  . FOOT SURGERY     RT  . KNEE ARTHROSCOPY Right 10/02/2013   Procedure: RIGHT KNEE ARTHROSCOPY, PARTIAL LATERAL MENISCECTOMY, DEBRIDEMENT, MEDIAL AND LATERAL  CHONDROPLASTY;  Surgeon: Gearlean Alf, MD;  Location: [facility];  Service: Orthopedics;  Laterality: Right;  . KNEE ARTHROSCOPY WITH MEDIAL MENISECTOMY Right 03/25/2015   Procedure: RIGHT KNEE ARTHROSCOPY WITH MENISCAL DEBRIDEMENT and chrodroplasty;  Surgeon: Gaynelle Arabian, MD;  Location: [facility];  Service: Orthopedics;  Laterality: Right;  . KNEE BURSECTOMY Right 03/25/2015   Procedure: RIGHT KNEE PREPATELLA BURSECTOMY;  Surgeon: Gaynelle Arabian, MD;  Location: [facility];  Service: Orthopedics;  Laterality: Right;  . LUMBAR LAMINECTOMY/DECOMPRESSION MICRODISCECTOMY N/A 02/06/2013   Procedure: MICRO LUMBAR DECOMPRESSION L3-L4 and L4 - L5 ;  Surgeon: Johnn Hai, MD;  Location: [facility];  Service: Orthopedics;  Laterality: N/A;  . PALATE SURGERY     surg for sleep apnea and sinus issues  . SHOULDER SURGERY     x5 on RT / x1 LFT  . TONSILLECTOMY     Constitutional Exam  General appearance: Well nourished, well developed, and well hydrated. In no apparent acute distress Vitals:   02/10/16 0815  BP: (!) 141/87  Pulse: (!) 121  Resp: 16  Temp: 98.4 F (36.9 C)  TempSrc: Oral  SpO2: 100%  Weight: 240 lb (108.9 kg)  Height: 5' 6.5" (1.689 m)   BMI Assessment: Estimated body mass index is 38.16 kg/m as calculated from the following:   Height as of this encounter: 5' 6.5" (1.689 m).   Weight as of this encounter: 240 lb (108.9 kg).  BMI interpretation table: BMI level Category Range association with higher incidence of chronic pain  <18 kg/m2 Underweight   18.5-24.9 kg/m2 Ideal body weight   25-29.9 kg/m2 Overweight Increased incidence by 20%  30-34.9 kg/m2 Obese (Class I) Increased incidence by 68%  35-39.9 kg/m2 Severe obesity (Class II) Increased incidence by 136%  >40 kg/m2 Extreme obesity (Class III) Increased incidence by 254%   BMI Readings from Last 4 Encounters:  02/10/16 38.16 kg/m  12/16/15 39.54 kg/m  11/19/15 38.25 kg/m  11/04/15 38.25 kg/m   Wt Readings from Last 4  Encounters:  02/10/16 240 lb (108.9 kg)  12/16/15 245 lb (111.1 kg)  11/19/15 237 lb (107.5 kg)  11/04/15 237 lb (107.5 kg)  Psych/Mental status: Alert, oriented x 3 (person, place, & time)       Eyes: PERLA Respiratory: No evidence of acute respiratory distress  Cervical Spine Exam  Inspection: No masses, redness, or swelling Alignment: Symmetrical Functional ROM: Unrestricted ROM Stability: No instability detected Muscle strength & Tone: Functionally intact Sensory: Unimpaired Palpation: Non-contributory  Upper Extremity (UE) Exam    Side: Right upper extremity  Side: Left upper extremity  Inspection: No masses, redness, swelling, or asymmetry  Inspection: No masses, redness, swelling,  or asymmetry  Functional ROM: Unrestricted ROM          Functional ROM: Unrestricted ROM          Muscle strength & Tone: Functionally intact  Muscle strength & Tone: Functionally intact  Sensory: Unimpaired  Sensory: Unimpaired  Palpation: Non-contributory  Palpation: Non-contributory   Thoracic Spine Exam  Inspection: No masses, redness, or swelling Alignment: Symmetrical Functional ROM: Unrestricted ROM Stability: No instability detected Sensory: Unimpaired Muscle strength & Tone: Functionally intact Palpation: Non-contributory  Lumbar Spine Exam  Inspection: Well healed scar from previous spine surgery detected Alignment: Symmetrical Functional ROM: Limited ROM Stability: No instability detected Muscle strength & Tone: Increased muscle tone over affected area Sensory: Movement-associated pain Palpation: Complains of area being tender to palpation Provocative Tests: Lumbar Hyperextension and rotation test: Positive bilaterally for facet joint pain. Patrick's Maneuver: evaluation deferred today              Gait & Posture Assessment  Ambulation: Unassisted Gait: Relatively normal for age and body habitus Posture: WNL   Lower Extremity Exam    Side: Right lower extremity  Side:  Left lower extremity  Inspection: No masses, redness, swelling, or asymmetry  Inspection: No masses, redness, swelling, or asymmetry  Functional ROM: Unrestricted ROM          Functional ROM: Unrestricted ROM          Muscle strength & Tone: Functionally intact  Muscle strength & Tone: Functionally intact  Sensory: Unimpaired  Sensory: Unimpaired  Palpation: Non-contributory  Palpation: Non-contributory   Assessment  Primary Diagnosis & Pertinent Problem List: The primary encounter diagnosis was Lumbar facet syndrome (WC injury) (Bilateral) (R>L). 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  Status Diagnosis  Not improving Not improving Controlled 1. Lumbar facet syndrome (WC injury) (Bilateral) (R>L)   2. Chronic low back pain (WC injury) (Location of Primary Source of Pain) (Bilateral) (R>L)   3. Chronic pain of right knee   4. Chronic pain syndrome   5. 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 Neurogenic pain   7. Long term current use of opiate analgesic   8. Opiate use (30 MME/Day)      Plan of Care  Pharmacotherapy (Medications Ordered): Meds ordered this encounter  Medications  . gabapentin (NEURONTIN) 600 MG tablet    Sig: Take 1 tablet (600 mg total) by mouth every 6 (six) hours.    Dispense:  360 tablet    Refill:  0    Do not place this medication, or any other prescription from our practice, on "Automatic Refill". Patient may have prescription filled one day early if pharmacy is closed on scheduled refill date.  Marland Kitchen oxyCODONE (OXY IR/ROXICODONE) 5 MG immediate release tablet    Sig: Take 1 tablet (5 mg total) by mouth every 6 (six) hours as needed for severe pain.    Dispense:  120  tablet    Refill:  0    Do not place this medication, or any other prescription from our practice, on "Automatic Refill". Patient may have prescription filled one day early if pharmacy is closed on scheduled refill date. Do not fill until: 03/13/16 To last until: 04/12/16  . oxyCODONE (OXY  IR/ROXICODONE) 5 MG immediate release tablet    Sig: Take 1 tablet (5 mg total) by mouth every 6 (six) hours as needed for severe pain.    Dispense:  120 tablet    Refill:  0    Do not place this medication, or any other prescription from our practice, on "Automatic Refill". Patient may have prescription filled one day early if pharmacy is closed on scheduled refill date. Do not fill until: 04/12/16 To last until: 05/12/16  . oxyCODONE (OXY IR/ROXICODONE) 5 MG immediate release tablet    Sig: Take 1 tablet (5 mg total) by mouth every 6 (six) hours as needed for severe pain.    Dispense:  120 tablet    Refill:  0    Do not place this medication, or any other prescription from our practice, on "Automatic Refill". Patient may have prescription filled one day early if pharmacy is closed on scheduled refill date. Do not fill until: 05/12/16 To last until: 06/11/16   New Prescriptions   OXYCODONE (OXY IR/ROXICODONE) 5 MG IMMEDIATE RELEASE TABLET    Take 1 tablet (5 mg total) by mouth every 6 (six) hours as needed for severe pain.   OXYCODONE (OXY IR/ROXICODONE) 5 MG IMMEDIATE RELEASE TABLET    Take 1 tablet (5 mg total) by mouth every 6 (six) hours as needed for severe pain.   Medications administered today: Mr. [place] had no medications administered during this visit. Lab-work, procedure(s), and/or referral(s): Orders Placed This Encounter  Procedures  . Radiofrequency,Lumbar   Imaging and/or referral(s): None  Interventional therapies: Planned, scheduled, and/or pending:   Bilateral lumbar facet radiofrequency ablation under fluoroscopic guidance and IV sedation, starting with the right side. We  plan to do the left side 2 weeks after we do the right.    Considering:   Diagnostic bilateral lumbar facet block under fluoroscopic guidance and IV sedation.  Possible bilateral lumbar facet radiofrequency ablation under fluoroscopic guidance and IV sedation.  Diagnostic right knee genicular nerve block under fluoroscopic guidance, with or without sedation.  Possible right knee genicular nerve radiofrequency ablation under fluoroscopic guidance and IV sedation, the peroneal and the results of the diagnostic injection.    Palliative PRN treatment(s):   None at this time.   Provider-requested follow-up: Return in about 3 months (around 05/10/2016) for (NP) Med-Mgmt, procedure (ASAA).  Future Appointments Date Time Provider [place]  05/05/2016 8:00 AM Milinda Pointer, MD [facility] None   Primary Care Physician: No Pcp Per Patient Location: [facility] Outpatient Pain Management Facility Note by: Kathlen Brunswick. Dossie Arbour, M.D, DABA, DABAPM, DABPM, DABIPP, FIPP Date: 02/10/2016; Time: 1:04 PM  Pain Score Disclaimer: We use the NRS-11 scale. This is a self-reported, subjective measurement of pain severity with only modest accuracy. It is used primarily to identify changes within a particular patient. It must be understood that outpatient pain scales are significantly less accurate that those used for research, where they can be applied under ideal controlled circumstances with minimal exposure to variables. In reality, the score is likely to be a combination of pain intensity and pain affect, where pain affect describes the degree of emotional arousal or changes in action readiness caused by the sensory experience of pain. Factors such as social and work situation, setting, emotional state, anxiety levels, expectation, and prior pain experience may influence pain perception and show large inter-individual differences that may also be affected by time variables.  Patient instructions provided during  this appointment: Patient Instructions   Radiofrequency Lesioning Introduction  Radiofrequency lesioning is a procedure that is performed to relieve pain. The procedure is often used for back, neck, or arm pain. Radiofrequency lesioning involves the use of a machine that creates radio waves to make heat. During the procedure, the heat is applied to the nerve that carries the pain signal. The heat damages the nerve and interferes with the pain signal. Pain relief usually starts about 2 weeks after the procedure and lasts for 6 months to 1 year. Tell a health care provider about:  Any allergies you have.  All medicines you are taking, including vitamins, herbs, eye drops, creams, and over-the-counter medicines.  Any problems you or family members have had with anesthetic medicines.  Any blood disorders you have.  Any surgeries you have had.  Any medical conditions you have.  Whether you are pregnant or may be pregnant. What are the risks? Generally, this is a safe procedure. However, problems may occur, including:  Pain or soreness at the injection site.  Infection at the injection site.  Damage to nerves or blood vessels. What happens before the procedure?  Ask your health care provider about:  Changing or stopping your regular medicines. This is especially important if you are taking diabetes medicines or blood thinners.  Taking medicines such as aspirin and ibuprofen. These medicines can thin your blood. Do not take these medicines before your procedure if your health care provider instructs you not to.  Follow instructions from your health care provider about eating or drinking restrictions.  Plan to have someone take you home after the procedure.  If you go home right after the procedure, plan to have someone with you for 24 hours. What happens during the procedure?  You will be given one or more of the following:  A medicine to help you relax (sedative).  A medicine  to numb the area (local anesthetic).  You will be awake during the procedure. You will need to be able to talk with the health care provider during the procedure.  With the help of a type of X-ray (fluoroscopy), the health care provider will insert a radiofrequency needle into the area to be treated.  Next, a wire that carries the radio waves (electrode) will be put through the radiofrequency needle. An electrical pulse will be sent through the electrode to verify the correct nerve. You will feel a tingling sensation, and you may have muscle twitching.  Then, the tissue that is around the needle tip will be heated by an electric current that is passed using the radiofrequency machine. This will numb the nerves.  A bandage (dressing) will be put on the insertion area after the procedure is done. The procedure may vary among health care providers and hospitals. What happens after the procedure?  Your blood pressure, heart rate, breathing rate, and blood oxygen level will be monitored often until the medicines you were given have worn off.  Return to your normal activities as directed by your health care provider. This information is not intended to replace advice given to you by your health care provider. Make sure you discuss any questions you have with your health care provider. Document Released: 09/01/2010 Document Revised: 06/11/2015 Document Reviewed: 02/10/2014  2017 [employer] Facet Blocks Patient Information  Description: The facets are joints in the spine between the vertebrae.  Like any joints in the body, facets can become irritated and painful.  Arthritis can also effect the facets.  By injecting steroids and local anesthetic in and around these joints,  we can temporarily block the nerve supply to them.  Steroids act directly on irritated nerves and tissues to reduce selling and inflammation which often leads to decreased pain.  Facet blocks may be done anywhere along the spine from  the neck to the low back depending upon the location of your pain.   After numbing the skin with local anesthetic (like Novocaine), a small needle is passed onto the facet joints under x-ray guidance.  You may experience a sensation of pressure while this is being done.  The entire block usually lasts about 15-25 minutes.   Conditions which may be treated by facet blocks:   Low back/buttock pain  Neck/shoulder pain  Certain types of headaches  Preparation for the injection:  1. Do not eat any solid food or dairy products within 8 hours of your appointment. 2. You may drink clear liquid up to 3 hours before appointment.  Clear liquids include water, black coffee, juice or soda.  No milk or cream please. 3. You may take your regular medication, including pain medications, with a sip of water before your appointment.  Diabetics should hold regular insulin (if taken separately) and take 1/2 normal NPH dose the morning of the procedure.  Carry some sugar containing items with you to your appointment. 4. A driver must accompany you and be prepared to drive you home after your procedure. 5. Bring all your current medications with you. 6. An IV may be inserted and sedation may be given at the discretion of the physician. 7. A blood pressure cuff, EKG and other monitors will often be applied during the procedure.  Some patients may need to have extra oxygen administered for a short period. 8. You will be asked to provide medical information, including your allergies and medications, prior to the procedure.  We must know immediately if you are taking blood thinners (like Coumadin/Warfarin) or if you are allergic to IV iodine contrast (dye).  We must know if you could possible be pregnant.  Possible side-effects:   Bleeding from needle site  Infection (rare, may require surgery)  Nerve injury (rare)  Numbness & tingling (temporary)  Difficulty urinating (rare, temporary)  Spinal headache (a  headache worse with upright posture)  Light-headedness (temporary)  Pain at injection site (serveral days)  Decreased blood pressure (rare, temporary)  Weakness in arm/leg (temporary)  Pressure sensation in back/neck (temporary)   Call if you experience:   Fever/chills associated with headache or increased back/neck pain  Headache worsened by an upright position  New onset, weakness or numbness of an extremity below the injection site  Hives or difficulty breathing (go to the emergency room)  Inflammation or drainage at the injection site(s)  Severe back/neck pain greater than usual  New symptoms which are concerning to you  Please note:  Although the local anesthetic injected can often make your back or neck feel good for several hours after the injection, the pain will likely return. It takes 3-7 days for steroids to work.  You may not notice any pain relief for at least one week.  If effective, we will often do a series of 2-3 injections spaced 3-6 weeks apart to maximally decrease your pain.  After the initial series, you may be a candidate for a more permanent nerve block of the facets.  If you have any questions, please call #[phone]) [place]  What are the risk, side effects and possible complications? Generally speaking, most  procedures are safe.  However, with any procedure there are risks, side effects, and the possibility of complications.  The risks and complications are dependent upon the sites that are lesioned, or the type of nerve block to be performed.  The closer the procedure is to the spine, the more serious the risks are.  Great care is taken when placing the radio frequency needles, block needles or lesioning probes, but sometimes complications can occur. 1. Infection: Any time there is an injection through the skin, there is a risk of infection.  This is why sterile conditions are  used for these blocks.  There are four possible types of infection. 1. Localized skin infection. 2. Central Nervous System Infection-This can be in the form of Meningitis, which can be deadly. 3. Epidural Infections-This can be in the form of an epidural abscess, which can cause pressure inside of the spine, causing compression of the spinal cord with subsequent paralysis. This would require an emergency surgery to decompress, and there are no guarantees that the patient would recover from the paralysis. 4. Discitis-This is an infection of the intervertebral discs.  It occurs in about 1% of discography procedures.  It is difficult to treat and it may lead to surgery.        2. Pain: the needles have to go through skin and soft tissues, will cause soreness.       3. Damage to internal structures:  The nerves to be lesioned may be near blood vessels or    other nerves which can be potentially damaged.       4. Bleeding: Bleeding is more common if the patient is taking blood thinners such as  aspirin, Coumadin, Ticiid, Plavix, etc., or if he/she have some genetic predisposition  such as hemophilia. Bleeding into the spinal canal can cause compression of the spinal  cord with subsequent paralysis.  This would require an emergency surgery to  decompress and there are no guarantees that the patient would recover from the  paralysis.       5. Pneumothorax:  Puncturing of a lung is a possibility, every time a needle is introduced in  the area of the chest or upper back.  Pneumothorax refers to free air around the  collapsed lung(s), inside of the thoracic cavity (chest cavity).  Another two possible  complications related to a similar event would include: Hemothorax and Chylothorax.   These are variations of the Pneumothorax, where instead of air around the collapsed  lung(s), you may have blood or chyle, respectively.       6. Spinal headaches: They may occur with any procedures in the area of the spine.        7. Persistent CSF (Cerebro-Spinal Fluid) leakage: This is a rare problem, but may occur  with prolonged intrathecal or epidural catheters either due to the formation of a fistulous  track or a dural tear.       8. Nerve damage: By working so close to the spinal cord, there is always a possibility of  nerve damage, which could be as serious as a permanent spinal cord injury with  paralysis.       9. Death:  Although rare, severe deadly allergic reactions known as "Anaphylactic  reaction" can occur to any of the medications used.      10. Worsening of the symptoms:  We can always make thing worse.  What are the chances of something like this happening? Chances of any of this occuring  are extremely low.  By statistics, you have more of a chance of getting killed in a motor vehicle accident: while driving to the hospital than any of the above occurring .  Nevertheless, you should be aware that they are possibilities.  In general, it is similar to taking a shower.  Everybody knows that you can slip, hit your head and get killed.  Does that mean that you should not shower again?  Nevertheless always keep in mind that statistics do not mean anything if you happen to be on the wrong side of them.  Even if a procedure has a 1 (one) in a 1,000,000 (million) chance of going wrong, it you happen to be that one..Also, keep in mind that by statistics, you have more of a chance of having something go wrong when taking medications.  Who should not have this procedure? If you are on a blood thinning medication (e.g. Coumadin, Plavix, see list of "Blood Thinners"), or if you have an active infection going on, you should not have the procedure.  If you are taking any blood thinners, please inform your physician.  How should I prepare for this procedure?  Do not eat or drink anything at least six hours prior to the procedure.  Bring a driver with you .  It cannot be a taxi.  Come accompanied by an adult that can drive  you back, and that is strong enough to help you if your legs get weak or numb from the local anesthetic.  Take all of your medicines the morning of the procedure with just enough water to swallow them.  If you have diabetes, make sure that you are scheduled to have your procedure done first thing in the morning, whenever possible.  If you have diabetes, take only half of your insulin dose and notify our nurse that you have done so as soon as you arrive at the clinic.  If you are diabetic, but only take blood sugar pills (oral hypoglycemic), then do not take them on the morning of your procedure.  You may take them after you have had the procedure.  Do not take aspirin or any aspirin-containing medications, at least eleven (11) days prior to the procedure.  They may prolong bleeding.  Wear loose fitting clothing that may be easy to take off and that you would not mind if it got stained with Betadine or blood.  Do not wear any jewelry or perfume  Remove any nail coloring.  It will interfere with some of our monitoring equipment.  NOTE: Remember that this is not meant to be interpreted as a complete list of all possible complications.  Unforeseen problems may occur.  BLOOD THINNERS The following drugs contain aspirin or other products, which can cause increased bleeding during surgery and should not be taken for 2 weeks prior to and 1 week after surgery.  If you should need take something for relief of minor pain, you may take acetaminophen which is found in Tylenol,m Datril, Anacin-3 and Panadol. It is not blood thinner. The products listed below are.  Do not take any of the products listed below in addition to any listed on your instruction sheet.  A.P.C or A.P.C with Codeine Codeine Phosphate Capsules #3 Ibuprofen Ridaura  ABC compound Congesprin Imuran rimadil  Advil Cope Indocin Robaxisal  Alka-Seltzer Effervescent Pain Reliever and Antacid Coricidin or Coricidin-D  Indomethacin Rufen   Alka-Seltzer plus Cold Medicine Cosprin Ketoprofen S-A-C Tablets  Anacin Analgesic Tablets or Capsules Coumadin  Korlgesic Salflex  Anacin Extra Strength Analgesic tablets or capsules CP-2 Tablets Lanoril Salicylate  Anaprox Cuprimine Capsules Levenox Salocol  Anexsia-D Dalteparin Magan Salsalate  Anodynos Darvon compound Magnesium Salicylate Sine-off  Ansaid Dasin Capsules Magsal Sodium Salicylate  Anturane Depen Capsules Marnal Soma  APF Arthritis pain formula Dewitt's Pills Measurin Stanback  Argesic Dia-Gesic Meclofenamic Sulfinpyrazone  Arthritis Bayer Timed Release Aspirin Diclofenac Meclomen Sulindac  Arthritis pain formula Anacin Dicumarol Medipren Supac  Analgesic (Safety coated) Arthralgen Diffunasal Mefanamic Suprofen  Arthritis Strength Bufferin Dihydrocodeine Mepro Compound Suprol  Arthropan liquid Dopirydamole Methcarbomol with Aspirin Synalgos  ASA tablets/Enseals Disalcid Micrainin Tagament  Ascriptin Doan's Midol Talwin  Ascriptin A/D Dolene Mobidin Tanderil  Ascriptin Extra Strength Dolobid Moblgesic Ticlid  Ascriptin with Codeine Doloprin or Doloprin with Codeine Momentum Tolectin  Asperbuf Duoprin Mono-gesic Trendar  Aspergum Duradyne Motrin or Motrin IB Triminicin  Aspirin plain, buffered or enteric coated Durasal Myochrisine Trigesic  Aspirin Suppositories Easprin Nalfon Trillsate  Aspirin with Codeine Ecotrin Regular or Extra Strength Naprosyn Uracel  Atromid-S Efficin Naproxen Ursinus  Auranofin Capsules Elmiron Neocylate Vanquish  Axotal Emagrin Norgesic Verin  Azathioprine Empirin or Empirin with Codeine Normiflo Vitamin E  Azolid Emprazil Nuprin Voltaren  Bayer Aspirin plain, buffered or children's or timed BC Tablets or powders Encaprin Orgaran Warfarin Sodium  Buff-a-Comp Enoxaparin Orudis Zorpin  Buff-a-Comp with Codeine Equegesic Os-Cal-Gesic   Buffaprin Excedrin plain, buffered or Extra Strength Oxalid   Bufferin Arthritis Strength Feldene  Oxphenbutazone   Bufferin plain or Extra Strength Feldene Capsules Oxycodone with Aspirin   Bufferin with Codeine Fenoprofen Fenoprofen Pabalate or Pabalate-SF   Buffets II Flogesic Panagesic   Buffinol plain or Extra Strength Florinal or Florinal with Codeine Panwarfarin   Buf-Tabs Flurbiprofen Penicillamine   Butalbital Compound Four-way cold tablets Penicillin   Butazolidin Fragmin Pepto-Bismol   Carbenicillin Geminisyn Percodan   Carna Arthritis Reliever Geopen Persantine   Carprofen Gold's salt Persistin   Chloramphenicol Goody's Phenylbutazone   Chloromycetin Haltrain Piroxlcam   Clmetidine heparin Plaquenil   Cllnoril Hyco-pap Ponstel   Clofibrate Hydroxy chloroquine Propoxyphen         Before stopping any of these medications, be sure to consult the physician who ordered them.  Some, such as Coumadin (Warfarin) are ordered to prevent or treat serious conditions such as "deep thrombosis", "pumonary embolisms", and other heart problems.  The amount of time that you may need off of the medication may also vary with the medication and the reason for which you were taking it.  If you are taking any of these medications, please make sure you notify your pain physician before you undergo any procedures.

## 2016-02-10 ENCOUNTER — Ambulatory Visit: Payer: Worker's Compensation | Attending: Pain Medicine | Admitting: Pain Medicine

## 2016-02-10 ENCOUNTER — Encounter: Payer: Self-pay | Admitting: Pain Medicine

## 2016-02-10 VITALS — BP 141/87 | HR 121 | Temp 98.4°F | Resp 16 | Ht 66.5 in | Wt 240.0 lb

## 2016-02-10 DIAGNOSIS — Z833 Family history of diabetes mellitus: Secondary | ICD-10-CM | POA: Insufficient documentation

## 2016-02-10 DIAGNOSIS — G894 Chronic pain syndrome: Secondary | ICD-10-CM | POA: Diagnosis not present

## 2016-02-10 DIAGNOSIS — M961 Postlaminectomy syndrome, not elsewhere classified: Secondary | ICD-10-CM | POA: Diagnosis not present

## 2016-02-10 DIAGNOSIS — G4730 Sleep apnea, unspecified: Secondary | ICD-10-CM | POA: Diagnosis not present

## 2016-02-10 DIAGNOSIS — M5416 Radiculopathy, lumbar region: Secondary | ICD-10-CM | POA: Insufficient documentation

## 2016-02-10 DIAGNOSIS — M1288 Other specific arthropathies, not elsewhere classified, other specified site: Secondary | ICD-10-CM

## 2016-02-10 DIAGNOSIS — M48061 Spinal stenosis, lumbar region without neurogenic claudication: Secondary | ICD-10-CM | POA: Diagnosis not present

## 2016-02-10 DIAGNOSIS — Z836 Family history of other diseases of the respiratory system: Secondary | ICD-10-CM | POA: Diagnosis not present

## 2016-02-10 DIAGNOSIS — M25561 Pain in right knee: Secondary | ICD-10-CM

## 2016-02-10 DIAGNOSIS — E119 Type 2 diabetes mellitus without complications: Secondary | ICD-10-CM | POA: Diagnosis not present

## 2016-02-10 DIAGNOSIS — M25511 Pain in right shoulder: Secondary | ICD-10-CM | POA: Insufficient documentation

## 2016-02-10 DIAGNOSIS — Z981 Arthrodesis status: Secondary | ICD-10-CM | POA: Diagnosis not present

## 2016-02-10 DIAGNOSIS — Z6838 Body mass index (BMI) 38.0-38.9, adult: Secondary | ICD-10-CM | POA: Insufficient documentation

## 2016-02-10 DIAGNOSIS — Z8249 Family history of ischemic heart disease and other diseases of the circulatory system: Secondary | ICD-10-CM | POA: Insufficient documentation

## 2016-02-10 DIAGNOSIS — M792 Neuralgia and neuritis, unspecified: Secondary | ICD-10-CM | POA: Diagnosis not present

## 2016-02-10 DIAGNOSIS — K589 Irritable bowel syndrome without diarrhea: Secondary | ICD-10-CM | POA: Diagnosis not present

## 2016-02-10 DIAGNOSIS — F1190 Opioid use, unspecified, uncomplicated: Secondary | ICD-10-CM

## 2016-02-10 DIAGNOSIS — Z79891 Long term (current) use of opiate analgesic: Secondary | ICD-10-CM

## 2016-02-10 DIAGNOSIS — M47816 Spondylosis without myelopathy or radiculopathy, lumbar region: Secondary | ICD-10-CM

## 2016-02-10 DIAGNOSIS — E785 Hyperlipidemia, unspecified: Secondary | ICD-10-CM | POA: Insufficient documentation

## 2016-02-10 DIAGNOSIS — Z809 Family history of malignant neoplasm, unspecified: Secondary | ICD-10-CM | POA: Diagnosis not present

## 2016-02-10 DIAGNOSIS — G8929 Other chronic pain: Secondary | ICD-10-CM

## 2016-02-10 DIAGNOSIS — Z794 Long term (current) use of insulin: Secondary | ICD-10-CM | POA: Insufficient documentation

## 2016-02-10 DIAGNOSIS — M545 Low back pain, unspecified: Secondary | ICD-10-CM

## 2016-02-10 DIAGNOSIS — M479 Spondylosis, unspecified: Secondary | ICD-10-CM | POA: Diagnosis not present

## 2016-02-10 DIAGNOSIS — Z87442 Personal history of urinary calculi: Secondary | ICD-10-CM | POA: Insufficient documentation

## 2016-02-10 MED ORDER — OXYCODONE HCL 5 MG PO TABS: 5.0000 mg | ORAL_TABLET | Freq: Four times a day (QID) | ORAL | 0 refills | Status: DC | PRN

## 2016-02-10 MED ORDER — GABAPENTIN 600 MG PO TABS: 600.0000 mg | ORAL_TABLET | Freq: Four times a day (QID) | ORAL | 0 refills | Status: DC

## 2016-02-10 NOTE — Patient Instructions (Signed)
Radiofrequency Lesioning Introduction Radiofrequency lesioning is a procedure that is performed to relieve pain. The procedure is often used for back, neck, or arm pain. Radiofrequency lesioning involves the use of a machine that creates radio waves to make heat. During the procedure, the heat is applied to the nerve that carries the pain signal. The heat damages the nerve and interferes with the pain signal. Pain relief usually starts about 2 weeks after the procedure and lasts for 6 months to 1 year. Tell a health care provider about:  Any allergies you have.  All medicines you are taking, including vitamins, herbs, eye drops, creams, and over-the-counter medicines.  Any problems you or family members have had with anesthetic medicines.  Any blood disorders you have.  Any surgeries you have had.  Any medical conditions you have.  Whether you are pregnant or may be pregnant. What are the risks? Generally, this is a safe procedure. However, problems may occur, including:  Pain or soreness at the injection site.  Infection at the injection site.  Damage to nerves or blood vessels. What happens before the procedure?  Ask your health care provider about:  Changing or stopping your regular medicines. This is especially important if you are taking diabetes medicines or blood thinners.  Taking medicines such as aspirin and ibuprofen. These medicines can thin your blood. Do not take these medicines before your procedure if your health care provider instructs you not to.  Follow instructions from your health care provider about eating or drinking restrictions.  Plan to have someone take you home after the procedure.  If you go home right after the procedure, plan to have someone with you for 24 hours. What happens during the procedure?  You will be given one or more of the following:  A medicine to help you relax (sedative).  A medicine to numb the area (local anesthetic).  You  will be awake during the procedure. You will need to be able to talk with the health care provider during the procedure.  With the help of a type of X-ray (fluoroscopy), the health care provider will insert a radiofrequency needle into the area to be treated.  Next, a wire that carries the radio waves (electrode) will be put through the radiofrequency needle. An electrical pulse will be sent through the electrode to verify the correct nerve. You will feel a tingling sensation, and you may have muscle twitching.  Then, the tissue that is around the needle tip will be heated by an electric current that is passed using the radiofrequency machine. This will numb the nerves.  A bandage (dressing) will be put on the insertion area after the procedure is done. The procedure may vary among health care providers and hospitals. What happens after the procedure?  Your blood pressure, heart rate, breathing rate, and blood oxygen level will be monitored often until the medicines you were given have worn off.  Return to your normal activities as directed by your health care provider. This information is not intended to replace advice given to you by your health care provider. Make sure you discuss any questions you have with your health care provider. Document Released: 09/01/2010 Document Revised: 06/11/2015 Document Reviewed: 02/10/2014  2017 [employer] Facet Blocks Patient Information  Description: The facets are joints in the spine between the vertebrae.  Like any joints in the body, facets can become irritated and painful.  Arthritis can also effect the facets.  By injecting steroids and local anesthetic in and  around these joints, we can temporarily block the nerve supply to them.  Steroids act directly on irritated nerves and tissues to reduce selling and inflammation which often leads to decreased pain.  Facet blocks may be done anywhere along the spine from the neck to the low back depending upon the  location of your pain.   After numbing the skin with local anesthetic (like Novocaine), a small needle is passed onto the facet joints under x-ray guidance.  You may experience a sensation of pressure while this is being done.  The entire block usually lasts about 15-25 minutes.   Conditions which may be treated by facet blocks:   Low back/buttock pain  Neck/shoulder pain  Certain types of headaches  Preparation for the injection:  1. Do not eat any solid food or dairy products within 8 hours of your appointment. 2. You may drink clear liquid up to 3 hours before appointment.  Clear liquids include water, black coffee, juice or soda.  No milk or cream please. 3. You may take your regular medication, including pain medications, with a sip of water before your appointment.  Diabetics should hold regular insulin (if taken separately) and take 1/2 normal NPH dose the morning of the procedure.  Carry some sugar containing items with you to your appointment. 4. A driver must accompany you and be prepared to drive you home after your procedure. 5. Bring all your current medications with you. 6. An IV may be inserted and sedation may be given at the discretion of the physician. 7. A blood pressure cuff, EKG and other monitors will often be applied during the procedure.  Some patients may need to have extra oxygen administered for a short period. 8. You will be asked to provide medical information, including your allergies and medications, prior to the procedure.  We must know immediately if you are taking blood thinners (like Coumadin/Warfarin) or if you are allergic to IV iodine contrast (dye).  We must know if you could possible be pregnant.  Possible side-effects:   Bleeding from needle site  Infection (rare, may require surgery)  Nerve injury (rare)  Numbness & tingling (temporary)  Difficulty urinating (rare, temporary)  Spinal headache (a headache worse with upright  posture)  Light-headedness (temporary)  Pain at injection site (serveral days)  Decreased blood pressure (rare, temporary)  Weakness in arm/leg (temporary)  Pressure sensation in back/neck (temporary)   Call if you experience:   Fever/chills associated with headache or increased back/neck pain  Headache worsened by an upright position  New onset, weakness or numbness of an extremity below the injection site  Hives or difficulty breathing (go to the emergency room)  Inflammation or drainage at the injection site(s)  Severe back/neck pain greater than usual  New symptoms which are concerning to you  Please note:  Although the local anesthetic injected can often make your back or neck feel good for several hours after the injection, the pain will likely return. It takes 3-7 days for steroids to work.  You may not notice any pain relief for at least one week.  If effective, we will often do a series of 2-3 injections spaced 3-6 weeks apart to maximally decrease your pain.  After the initial series, you may be a candidate for a more permanent nerve block of the facets.  If you have any questions, please call #[phone] [facility] Clinic[facility] R[facility] AND COM[facility]S  What are the risk, side effects and possible complications?  Generally speaking, most procedures are safe.  However, with any procedure there are risks, side effects, and the possibility of complications.  The risks and complications are dependent upon the sites that are lesioned, or the type of nerve block to be performed.  The closer the procedure is to the spine, the more serious the risks are.  Great care is taken when placing the radio frequency needles, block needles or lesioning probes, but sometimes complications can occur. 1. Infection: Any time there is an injection through the skin, there is a risk of infection.  This is why sterile conditions are used for these blocks.  There  are four possible types of infection. 1. Localized skin infection. 2. Central Nervous System Infection-This can be in the form of Meningitis, which can be deadly. 3. Epidural Infections-This can be in the form of an epidural abscess, which can cause pressure inside of the spine, causing compression of the spinal cord with subsequent paralysis. This would require an emergency surgery to decompress, and there are no guarantees that the patient would recover from the paralysis. 4. Discitis-This is an infection of the intervertebral discs.  It occurs in about 1% of discography procedures.  It is difficult to treat and it may lead to surgery.        2. Pain: the needles have to go through skin and soft tissues, will cause soreness.       3. Damage to internal structures:  The nerves to be lesioned may be near blood vessels or    other nerves which can be potentially damaged.       4. Bleeding: Bleeding is more common if the patient is taking blood thinners such as  aspirin, Coumadin, Ticiid, Plavix, etc., or if he/she have some genetic predisposition  such as hemophilia. Bleeding into the spinal canal can cause compression of the spinal  cord with subsequent paralysis.  This would require an emergency surgery to  decompress and there are no guarantees that the patient would recover from the  paralysis.       5. Pneumothorax:  Puncturing of a lung is a possibility, every time a needle is introduced in  the area of the chest or upper back.  Pneumothorax refers to free air around the  collapsed lung(s), inside of the thoracic cavity (chest cavity).  Another two possible  complications related to a similar event would include: Hemothorax and Chylothorax.   These are variations of the Pneumothorax, where instead of air around the collapsed  lung(s), you may have blood or chyle, respectively.       6. Spinal headaches: They may occur with any procedures in the area of the spine.       7. Persistent CSF  (Cerebro-Spinal Fluid) leakage: This is a rare problem, but may occur  with prolonged intrathecal or epidural catheters either due to the formation of a fistulous  track or a dural tear.       8. Nerve damage: By working so close to the spinal cord, there is always a possibility of  nerve damage, which could be as serious as a permanent spinal cord injury with  paralysis.       9. Death:  Although rare, severe deadly allergic reactions known as "Anaphylactic  reaction" can occur to any of the medications used.      10. Worsening of the symptoms:  We can always make thing worse.  What are the chances of something like this happening? Chances of any  of this occuring are extremely low.  By statistics, you have more of a chance of getting killed in a motor vehicle accident: while driving to the hospital than any of the above occurring .  Nevertheless, you should be aware that they are possibilities.  In general, it is similar to taking a shower.  Everybody knows that you can slip, hit your head and get killed.  Does that mean that you should not shower again?  Nevertheless always keep in mind that statistics do not mean anything if you happen to be on the wrong side of them.  Even if a procedure has a 1 (one) in a 1,000,000 (million) chance of going wrong, it you happen to be that one..Also, keep in mind that by statistics, you have more of a chance of having something go wrong when taking medications.  Who should not have this procedure? If you are on a blood thinning medication (e.g. Coumadin, Plavix, see list of "Blood Thinners"), or if you have an active infection going on, you should not have the procedure.  If you are taking any blood thinners, please inform your physician.  How should I prepare for this procedure?  Do not eat or drink anything at least six hours prior to the procedure.  Bring a driver with you .  It cannot be a taxi.  Come accompanied by an adult that can drive you back, and that  is strong enough to help you if your legs get weak or numb from the local anesthetic.  Take all of your medicines the morning of the procedure with just enough water to swallow them.  If you have diabetes, make sure that you are scheduled to have your procedure done first thing in the morning, whenever possible.  If you have diabetes, take only half of your insulin dose and notify our nurse that you have done so as soon as you arrive at the clinic.  If you are diabetic, but only take blood sugar pills (oral hypoglycemic), then do not take them on the morning of your procedure.  You may take them after you have had the procedure.  Do not take aspirin or any aspirin-containing medications, at least eleven (11) days prior to the procedure.  They may prolong bleeding.  Wear loose fitting clothing that may be easy to take off and that you would not mind if it got stained with Betadine or blood.  Do not wear any jewelry or perfume  Remove any nail coloring.  It will interfere with some of our monitoring equipment.  NOTE: Remember that this is not meant to be interpreted as a complete list of all possible complications.  Unforeseen problems may occur.  BLOOD THINNERS The following drugs contain aspirin or other products, which can cause increased bleeding during surgery and should not be taken for 2 weeks prior to and 1 week after surgery.  If you should need take something for relief of minor pain, you may take acetaminophen which is found in Tylenol,m Datril, Anacin-3 and Panadol. It is not blood thinner. The products listed below are.  Do not take any of the products listed below in addition to any listed on your instruction sheet.  A.P.C or A.P.C with Codeine Codeine Phosphate Capsules #3 Ibuprofen Ridaura  ABC compound Congesprin Imuran rimadil  Advil Cope Indocin Robaxisal  Alka-Seltzer Effervescent Pain Reliever and Antacid Coricidin or Coricidin-D  Indomethacin Rufen  Alka-Seltzer plus  Cold Medicine Cosprin Ketoprofen S-A-C Tablets  Anacin Analgesic Tablets  or Capsules Coumadin Korlgesic Salflex  Anacin Extra Strength Analgesic tablets or capsules CP-2 Tablets Lanoril Salicylate  Anaprox Cuprimine Capsules Levenox Salocol  Anexsia-D Dalteparin Magan Salsalate  Anodynos Darvon compound Magnesium Salicylate Sine-off  Ansaid Dasin Capsules Magsal Sodium Salicylate  Anturane Depen Capsules Marnal Soma  APF Arthritis pain formula Dewitt's Pills Measurin Stanback  Argesic Dia-Gesic Meclofenamic Sulfinpyrazone  Arthritis Bayer Timed Release Aspirin Diclofenac Meclomen Sulindac  Arthritis pain formula Anacin Dicumarol Medipren Supac  Analgesic (Safety coated) Arthralgen Diffunasal Mefanamic Suprofen  Arthritis Strength Bufferin Dihydrocodeine Mepro Compound Suprol  Arthropan liquid Dopirydamole Methcarbomol with Aspirin Synalgos  ASA tablets/Enseals Disalcid Micrainin Tagament  Ascriptin Doan's Midol Talwin  Ascriptin A/D Dolene Mobidin Tanderil  Ascriptin Extra Strength Dolobid Moblgesic Ticlid  Ascriptin with Codeine Doloprin or Doloprin with Codeine Momentum Tolectin  Asperbuf Duoprin Mono-gesic Trendar  Aspergum Duradyne Motrin or Motrin IB Triminicin  Aspirin plain, buffered or enteric coated Durasal Myochrisine Trigesic  Aspirin Suppositories Easprin Nalfon Trillsate  Aspirin with Codeine Ecotrin Regular or Extra Strength Naprosyn Uracel  Atromid-S Efficin Naproxen Ursinus  Auranofin Capsules Elmiron Neocylate Vanquish  Axotal Emagrin Norgesic Verin  Azathioprine Empirin or Empirin with Codeine Normiflo Vitamin E  Azolid Emprazil Nuprin Voltaren  Bayer Aspirin plain, buffered or children's or timed BC Tablets or powders Encaprin Orgaran Warfarin Sodium  Buff-a-Comp Enoxaparin Orudis Zorpin  Buff-a-Comp with Codeine Equegesic Os-Cal-Gesic   Buffaprin Excedrin plain, buffered or Extra Strength Oxalid   Bufferin Arthritis Strength Feldene Oxphenbutazone   Bufferin  plain or Extra Strength Feldene Capsules Oxycodone with Aspirin   Bufferin with Codeine Fenoprofen Fenoprofen Pabalate or Pabalate-SF   Buffets II Flogesic Panagesic   Buffinol plain or Extra Strength Florinal or Florinal with Codeine Panwarfarin   Buf-Tabs Flurbiprofen Penicillamine   Butalbital Compound Four-way cold tablets Penicillin   Butazolidin Fragmin Pepto-Bismol   Carbenicillin Geminisyn Percodan   Carna Arthritis Reliever Geopen Persantine   Carprofen Gold's salt Persistin   Chloramphenicol Goody's Phenylbutazone   Chloromycetin Haltrain Piroxlcam   Clmetidine heparin Plaquenil   Cllnoril Hyco-pap Ponstel   Clofibrate Hydroxy chloroquine Propoxyphen         Before stopping any of these medications, be sure to consult the physician who ordered them.  Some, such as Coumadin (Warfarin) are ordered to prevent or treat serious conditions such as "deep thrombosis", "pumonary embolisms", and other heart problems.  The amount of time that you may need off of the medication may also vary with the medication and the reason for which you were taking it.  If you are taking any of these medications, please make sure you notify your pain physician before you undergo any procedures.

## 2016-02-10 NOTE — Progress Notes (Signed)
Nursing Pain Medication Assessment:  Safety precautions to be maintained throughout the outpatient stay will include: orient to surroundings, keep bed in low position, maintain call bell within reach at all times, provide assistance with transfer out of bed and ambulation.  Medication Inspection Compliance: Pill count conducted under aseptic conditions, in front of the patient. Neither the pills nor the bottle was removed from the patient's sight at any time. Once count was completed pills were immediately returned to the patient in their original bottle.  Medication: See above Pill Count: 120 of 120 pills remain Bottle Appearance: Standard pharmacy container. Clearly labeled. Filled Date: 01 / 21 / 2017 Medication last intake:02/10/16 @ 0630

## 2016-04-08 ENCOUNTER — Other Ambulatory Visit: Payer: Self-pay | Admitting: Orthopedic Surgery

## 2016-04-08 DIAGNOSIS — M25511 Pain in right shoulder: Secondary | ICD-10-CM

## 2016-04-11 ENCOUNTER — Telehealth: Payer: Self-pay | Admitting: Pain Medicine

## 2016-04-11 NOTE — Telephone Encounter (Signed)
Patient has left script dated 03-14-16 to fill Oxycodone 5mg  is it ok to fill this script ? Please let pharmacy know

## 2016-04-11 NOTE — Telephone Encounter (Signed)
Spoke with pharmacist and it has been over a month since had the oxycodone filled.  Prescription is ordered prn.  Gave approval to fill the oxycodone script.

## 2016-04-13 ENCOUNTER — Ambulatory Visit
Admission: RE | Admit: 2016-04-13 | Discharge: 2016-04-13 | Disposition: A | Discharge status: Home / Self Care | Payer: BLUE CROSS/BLUE SHIELD | Source: Ambulatory Visit | Attending: Pain Medicine | Admitting: Pain Medicine

## 2016-04-13 ENCOUNTER — Ambulatory Visit: Payer: Worker's Compensation | Attending: Pain Medicine | Admitting: Pain Medicine

## 2016-04-13 VITALS — BP 129/77 | HR 89 | Resp 18

## 2016-04-13 DIAGNOSIS — G8929 Other chronic pain: Secondary | ICD-10-CM | POA: Insufficient documentation

## 2016-04-13 DIAGNOSIS — G8918 Other acute postprocedural pain: Secondary | ICD-10-CM | POA: Diagnosis not present

## 2016-04-13 DIAGNOSIS — M545 Low back pain, unspecified: Secondary | ICD-10-CM

## 2016-04-13 DIAGNOSIS — M1288 Other specific arthropathies, not elsewhere classified, other specified site: Secondary | ICD-10-CM | POA: Insufficient documentation

## 2016-04-13 DIAGNOSIS — M47816 Spondylosis without myelopathy or radiculopathy, lumbar region: Secondary | ICD-10-CM | POA: Insufficient documentation

## 2016-04-13 MED ORDER — ROPIVACAINE HCL 5 MG/ML IJ SOLN
5.0000 mL | Freq: Once | INTRAMUSCULAR | Status: AC
  Administered 2016-04-13: 20 mL via PERINEURAL
  Filled 2016-04-13: qty 20

## 2016-04-13 MED ORDER — HYDROCODONE-ACETAMINOPHEN 5-325 MG PO TABS: 1.0000 | ORAL_TABLET | Freq: Four times a day (QID) | ORAL | 0 refills | Status: DC | PRN

## 2016-04-13 MED ORDER — LACTATED RINGERS IV SOLN
1000.0000 mL | Freq: Once | INTRAVENOUS | Status: AC
  Administered 2016-04-13: 1000 mL via INTRAVENOUS

## 2016-04-13 MED ORDER — MIDAZOLAM HCL 5 MG/5ML IJ SOLN
1.0000 mg | INTRAMUSCULAR | Status: DC | PRN
  Administered 2016-04-13: 3 mg via INTRAVENOUS
  Filled 2016-04-13: qty 5

## 2016-04-13 MED ORDER — FENTANYL CITRATE (PF) 100 MCG/2ML IJ SOLN
25.0000 ug | INTRAMUSCULAR | Status: DC | PRN
  Administered 2016-04-13: 100 ug via INTRAVENOUS
  Filled 2016-04-13: qty 2

## 2016-04-13 MED ORDER — LIDOCAINE HCL (PF) 1 % IJ SOLN: 10.0000 mL | Freq: Once | INTRAMUSCULAR | Status: DC

## 2016-04-13 MED ORDER — TRIAMCINOLONE ACETONIDE 40 MG/ML IJ SUSP
40.0000 mg | Freq: Once | INTRAMUSCULAR | Status: AC
  Administered 2016-04-13: 40 mg
  Filled 2016-04-13: qty 1

## 2016-04-13 NOTE — Patient Instructions (Addendum)

## 2016-04-13 NOTE — Progress Notes (Signed)
Safety precautions to be maintained throughout the outpatient stay will include: orient to surroundings, keep bed in low position, maintain call bell within reach at all times, provide assistance with transfer out of bed and ambulation.  

## 2016-04-13 NOTE — Progress Notes (Signed)
Patient's Name: Adam Petty.  MRN: [id number]  Referring Provider: No ref. provider found  DOB: Jun 11, 1960  PCP: No Pcp Per Patient  DOS: 04/13/2016  Note by: Jacquese Hackman A. Laban Emperor, MD  Service setting: Ambulatory outpatient  Location: [facility] (AMB) Pain Management Facility  Visit type: Procedure  Specialty: Interventional Pain Management  Patient type: Established   Primary Reason for Visit: Interventional Pain Management Treatment. CC: Back Pain (right, lower)  Procedure:  Anesthesia, Analgesia, Anxiolysis:  Type: Therapeutic Medial Branch Facet Radiofrequency Ablation Region: Lumbar Level: L2, L3, L4, L5, & S1 Medial Branch Level(s) Laterality: Right-Sided  Type: Local Anesthesia with Moderate (Conscious) Sedation Local Anesthetic: Lidocaine 1% Route: Intravenous (IV) IV Access: Secured Sedation: Meaningful verbal contact was maintained at all times during the procedure  Indication(s): Analgesia and Anxiety  Indications: 1. Lumbar facet syndrome (WC injury) (Bilateral) (R>L)   2. Chronic low back pain (WC injury) (Location of Primary Source of Pain) (Bilateral) (R>L)   3. Lumbar spondylosis   4. Acute postoperative pain    Adam Petty has either failed to respond, was unable to tolerate, or simply did not get enough benefit from other more conservative therapies including, but not limited to: 1. Over-the-counter medications 2. Anti-inflammatory medications 3. Muscle relaxants 4. Membrane stabilizers 5. Opioids 6. Physical therapy 7. Modalities (Heat, ice, etc.) 8. Invasive techniques such as nerve blocks. Adam Petty has attained more than 50% relief of the pain from a series of diagnostic injections conducted in separate occasions.  Pain Score: Pre-procedure: 5 /10 Post-procedure: 0-No pain/10  Pre-op Assessment:  Previous date of service: 02/10/16 Service provided: Evaluation Adam Petty is a 56 y.o. (year old), male patient, seen today for interventional treatment.  He  has a past surgical history that includes Tonsillectomy; Shoulder surgery; Palate surgery; Cervical fusion; Foot surgery; Lumbar laminectomy/decompression microdiscectomy (N/A, 02/06/2013); Knee arthroscopy (Right, 10/02/2013); Knee arthroscopy with medial menisectomy (Right, 03/25/2015); Knee bursectomy (Right, 03/25/2015); and Cystoscopy w/ retrogrades (Right, 11/19/2015). His primarily concern today is the Back Pain (right, lower)  Initial Vital Signs: There were no vitals taken for this visit. BMI:    Risk Assessment: Allergies: Reviewed. He has No Known Allergies.  Allergy Precautions: None required Coagulopathies: "Reviewed. None identified.  Blood-thinner therapy: None at this time Active Infection(s): Reviewed. None identified. Adam Petty is afebrile  Site Confirmation: Adam Petty was asked to confirm the procedure and laterality before marking the site Procedure checklist: Completed Consent: Before the procedure and under the influence of no sedative(s), amnesic(s), or anxiolytics, the patient was informed of the treatment options, risks and possible complications. To fulfill our ethical and legal obligations, as recommended by the American Medical Association's Code of Ethics, I have informed the patient of my clinical impression; the nature and purpose of the treatment or procedure; the risks, benefits, and possible complications of the intervention; the alternatives, including doing nothing; the risk(s) and benefit(s) of the alternative treatment(s) or procedure(s); and the risk(s) and benefit(s) of doing nothing. The patient was provided information about the general risks and possible complications associated with the procedure. These may include, but are not limited to: failure to achieve desired goals, infection, bleeding, organ or nerve damage, allergic reactions, paralysis, and death. In addition, the patient was informed of those risks and complications associated to Spine-related  procedures, such as failure to decrease pain; infection (i.e.: Meningitis, epidural or intraspinal abscess); bleeding (i.e.: epidural hematoma, subarachnoid hemorrhage, or any other type of intraspinal or peri-dural bleeding); organ or nerve damage (i.e.:  Any type of peripheral nerve, nerve root, or spinal cord injury) with subsequent damage to sensory, motor, and/or autonomic systems, resulting in permanent pain, numbness, and/or weakness of one or several areas of the body; allergic reactions; (i.e.: anaphylactic reaction); and/or death. Furthermore, the patient was informed of those risks and complications associated with the medications. These include, but are not limited to: allergic reactions (i.e.: anaphylactic or anaphylactoid reaction(s)); adrenal axis suppression; blood sugar elevation that in diabetics may result in ketoacidosis or comma; water retention that in patients with history of congestive heart failure may result in shortness of breath, pulmonary edema, and decompensation with resultant heart failure; weight gain; swelling or edema; medication-induced neural toxicity; particulate matter embolism and blood vessel occlusion with resultant organ, and/or nervous system infarction; and/or aseptic necrosis of one or more joints. Finally, the patient was informed that Medicine is not an exact science; therefore, there is also the possibility of unforeseen or unpredictable risks and/or possible complications that may result in a catastrophic outcome. The patient indicated having understood very clearly. We have given the patient no guarantees and we have made no promises. Enough time was given to the patient to ask questions, all of which were answered to the patient's satisfaction. Adam Petty has indicated that he wanted to continue with the procedure. Attestation: I, the ordering provider, attest that I have discussed with the patient the benefits, risks, side-effects, alternatives, likelihood of  achieving goals, and potential problems during recovery for the procedure that I have provided informed consent. Date: 04/13/2016; Time: 11:15 AM  Pre-Procedure Preparation:  Monitoring: As per clinic protocol. Respiration, ETCO2, SpO2, BP, heart rate and rhythm monitor placed and checked for adequate function Safety Precautions: Patient was assessed for positional comfort and pressure points before starting the procedure. Time-out: I initiated and conducted the "Time-out" before starting the procedure, as per protocol. The patient was asked to participate by confirming the accuracy of the "Time Out" information. Verification of the correct person, site, and procedure were performed and confirmed by me, the nursing staff, and the patient. "Time-out" conducted as per Joint Commission's Universal Protocol (UP.01.01.01). "Time-out" Date & Time: 04/13/2016; 1219 hrs.  Description of Procedure Process:   Position: Prone Target Area: For Lumbar Facet blocks, the target is the groove formed by the junction of the transverse process and superior articular process. For the L5 dorsal ramus, the target is the notch between superior articular process and sacral ala. For the S1 dorsal ramus, the target is the superior and lateral edge of the posterior S1 Sacral foramen. Approach: Paraspinal approach. Area Prepped: Entire Posterior Lumbosacral Region Prepping solution: Hibiclens (4.0% Chlorhexidine gluconate solution) Safety Precautions: Aspiration looking for blood return was conducted prior to all injections. At no point did we inject any substances, as a needle was being advanced. No attempts were made at seeking any paresthesias. Safe injection practices and needle disposal techniques used. Medications properly checked for expiration dates. SDV (single dose vial) medications used. Description of the Procedure: Protocol guidelines were followed. The patient was placed in position over the fluoroscopy table. The  target area was identified and the area prepped in the usual manner. Skin desensitized using vapocoolant spray. Skin & deeper tissues infiltrated with local anesthetic. Appropriate amount of time allowed to pass for local anesthetics to take effect. Radiofrequency needles were introduced to the area of the medial branch at the junction of the superior articular process and transverse process using fluoroscopy. Using the [employer], sensory stimulation using 50  Hz was used to locate & identify the nerve, making sure that the needle was positioned such that there was no sensory stimulation below 0.3 V or above 0.7 V. Stimulation using 2 Hz was used to evaluate the motor component. Care was taken not to lesion any nerves that demonstrated motor stimulation of the lower extremities at an output of less than 2.5 times that of the sensory threshold, or a maximum of 2.0 V. Once satisfactory placement of the needles was achieved, the above solution was slowly injected after negative aspiration. After waiting for at least 2 minutes, the ablation was performed at 80 degrees C for 60 seconds.The needles were then removed and the area cleansed, making sure to leave some of the prepping solution back to take advantage of its long term bactericidal properties. Vitals:   04/13/16 1309 04/13/16 1318 04/13/16 1328 04/13/16 1337  BP: (!) 137/94 (!) 145/73 (!) 146/81 129/77  Pulse: 87 91 87 89  Resp: 16 16 16 18   SpO2: 98% 96% 97% 97%    Start Time: 1220 hrs. End Time: 1308 hrs. Materials & Medications:  Needle(s) Type: Teflon-coated, curved tip, Radiofrequency needle(s) Gauge: 22G Length: 10cm Medication(s): We administered fentaNYL, lactated ringers, midazolam, triamcinolone acetonide, and ropivacaine (PF) 5 mg/mL (0.5%). Please see chart orders for dosing details.  Imaging Guidance (Spinal):  Type of Imaging Technique: Fluoroscopy Guidance (Spinal) Indication(s): Assistance in needle guidance  and placement for procedures requiring needle placement in or near specific anatomical locations not easily accessible without such assistance. Exposure Time: Please see nurses notes. Contrast: None used. Fluoroscopic Guidance: I was personally present during the use of fluoroscopy. "Tunnel Vision Technique" used to obtain the best possible view of the target area. Parallax error corrected before commencing the procedure. "Direction-depth-direction" technique used to introduce the needle under continuous pulsed fluoroscopy. Once target was reached, antero-posterior, oblique, and lateral fluoroscopic projection used confirm needle placement in all planes. Images permanently stored in EMR. Interpretation: No contrast injected. I personally interpreted the imaging intraoperatively. Adequate needle placement confirmed in multiple planes. Permanent images saved into the patient's record.  Antibiotic Prophylaxis:  Indication(s): None identified Antibiotic given: None  Post-operative Assessment:  EBL: None Complications: No immediate post-treatment complications observed by team, or reported by patient. Note: The patient tolerated the entire procedure well. A repeat set of vitals were taken after the procedure and the patient was kept under observation following institutional policy, for this type of procedure. Post-procedural neurological assessment was performed, showing return to baseline, prior to discharge. The patient was provided with post-procedure discharge instructions, including a section on how to identify potential problems. Should any problems arise concerning this procedure, the patient was given instructions to immediately contact us, at any time, without hesitation. In any case, we plan to contact the patient by telephone for a follow-up status report regarding this interventional procedure. Comments:  No additional relevant information.  Plan of Care  Disposition: Discharge home   Discharge Date & Time: 04/13/2016; 1341 hrs.  Physician-requested Follow-up:  Return for procedure (ASAA), keep scheduled Med-Mgmt appointment.  Future Appointments Date Time Provider Department Center  04/19/2016 10:15 AM [facility]  05/05/2016 8:00 AM Delano Metz, MD [facility] None   Medications ordered for procedure: Meds ordered this encounter  Medications  . fentaNYL (SUBLIMAZE) injection 25-50 mcg    Make sure Narcan is available in the pyxis when using this medication. In the event of respiratory depression (RR< 8/min): Titrate NARCAN (naloxone) in increments of 0.1 to 0.2  mg IV at 2-3 minute intervals, until desired degree of reversal.  . lactated ringers infusion 1,000 mL  . midazolam (VERSED) 5 MG/5ML injection 1-2 mg    Make sure Flumazenil is available in the pyxis when using this medication. If oversedation occurs, administer 0.2 mg IV over 15 sec. If after 45 sec no response, administer 0.2 mg again over 1 min; may repeat at 1 min intervals; not to exceed 4 doses (1 mg)  . triamcinolone acetonide (KENALOG-40) injection 40 mg  . lidocaine (PF) (XYLOCAINE) 1 % injection 10 mL  . ropivacaine (PF) 5 mg/mL (0.5%) (NAROPIN) injection 5 mL    Preservative-free (MPF), single use vial.  . HYDROcodone-acetaminophen (NORCO/VICODIN) 5-325 MG tablet    Sig: Take 1-2 tablets by mouth every 6 (six) hours as needed for moderate pain.    Dispense:  120 tablet    Refill:  0    Do not place this medication, or any other prescription from our practice, on "Automatic Refill". Patient may have prescription filled one day early if pharmacy is closed on scheduled refill date. Do not fill until: 04/13/16 To last until: 04/28/16   Medications administered: We administered fentaNYL, lactated ringers, midazolam, triamcinolone acetonide, and ropivacaine (PF) 5 mg/mL (0.5%).  See the medical record for exact dosing, route, and time of administration.  Lab-work, Procedure(s),  & Referral(s) Ordered: Orders Placed This Encounter  Procedures  . Radiofrequency,Lumbar  . DG C-Arm 1-60 Min-No Report  . Discharge instructions  . Follow-up  . Informed Consent Details: Transcribe to consent form and obtain patient signature  . Provider attestation of informed consent for procedure/surgical case  . Verify informed consent   Imaging Ordered: Results for orders placed in visit on 12/16/15  DG C-Arm 1-60 Min-No Report   Narrative Fluoroscopy was utilized by the requesting physician.  No radiographic  interpretation.    New Prescriptions   HYDROCODONE-ACETAMINOPHEN (NORCO/VICODIN) 5-325 MG TABLET    Take 1-2 tablets by mouth every 6 (six) hours as needed for moderate pain.   Primary Care Physician: No Pcp Per Patient Location: [facility] Note by: Kyarah Enamorado A. Laban Emperor, M.D, DABA, DABAPM, DABPM, DABIPP, FIPP Date: 04/13/2016; Time: 4:17 PM  Disclaimer:  Medicine is not an exact science. The only guarantee in medicine is that nothing is guaranteed. It is important to note that the decision to proceed with this intervention was based on the information collected from the patient. The Data and conclusions were drawn from the patient's questionnaire, the interview, and the physical examination. Because the information was provided in large part by the patient, it cannot be guaranteed that it has not been purposely or unconsciously manipulated. Every effort has been made to obtain as much relevant data as possible for this evaluation. It is important to note that the conclusions that lead to this procedure are derived in large part from the available data. Always take into account that the treatment will also be dependent on availability of resources and existing treatment guidelines, considered by other Pain Management Practitioners as being common knowledge and practice, at the time of the intervention. For Medico-Legal purposes, it is also important  to point out that variation in procedural techniques and pharmacological choices are the acceptable norm. The indications, contraindications, technique, and results of the above procedure should only be interpreted and judged by a Board-Certified Interventional Pain Specialist with extensive familiarity and expertise in the same exact procedure and technique. Attempts at providing opinions without similar or greater experience  and expertise than that of the treating physician will be considered as inappropriate and unethical, and shall result in a formal complaint to the state medical board and applicable specialty societies.  Instructions provided at this appointment: Patient Instructions  Post-Procedure instructions Instructions:  Apply ice: Fill a plastic sandwich bag with crushed ice. Cover it with a small towel and apply to injection site. Apply for 15 minutes then remove x 15 minutes. Repeat sequence on day of procedure, until you go to bed. The purpose is to minimize swelling and discomfort after procedure.  Apply heat: Apply heat to procedure site starting the day following the procedure. The purpose is to treat any soreness and discomfort from the procedure.  Food intake: Start with clear liquids (like water) and advance to regular food, as tolerated.   Physical activities: Keep activities to a minimum for the first 8 hours after the procedure.   Driving: If you have received any sedation, you are not allowed to drive for 24 hours after your procedure.  Blood thinner: Restart your blood thinner 6 hours after your procedure. (Only for those taking blood thinners)  Insulin: As soon as you can eat, you may resume your normal dosing schedule. (Only for those taking insulin)  Infection prevention: Keep procedure site clean and dry.  Post-procedure Pain Diary: Extremely important that this be done correctly and accurately. Recorded information will be used to determine the next step in  treatment.  Pain evaluated is that of treated area only. Do not include pain from an untreated area.  Complete every hour, on the hour, for the initial 8 hours. Set an alarm to help you do this part accurately.  Do not go to sleep and have it completed later. It will not be accurate.  Follow-up appointment: Keep your follow-up appointment after the procedure. Usually 2 weeks for most procedures. (6 weeks in the case of radiofrequency.) Bring you pain diary.  Expect:  From numbing medicine (AKA: Local Anesthetics): Numbness or decrease in pain.  Onset: Full effect within 15 minutes of injected.  Duration: It will depend on the type of local anesthetic used. On the average, 1 to 8 hours.   From steroids: Decrease in swelling or inflammation. Once inflammation is improved, relief of the pain will follow.  Onset of benefits: Depends on the amount of swelling present. The more swelling, the longer it will take for the benefits to be seen.   Duration: Steroids will stay in the system x 2 weeks. Duration of benefits will depend on multiple posibilities including persistent irritating factors.  From procedure: Some discomfort is to be expected once the numbing medicine wears off. This should be minimal if ice and heat are applied as instructed. Call if:  You experience numbness and weakness that gets worse with time, as opposed to wearing off.  New onset bowel or bladder incontinence. (Spinal procedures only)  Emergency Numbers:  Durning business hours (Monday - Thursday, 8:00 AM - 4:00 PM) (Friday, 9:00 AM - 12:00 Noon): [phone]  After hours: [phone]   __________________________________________________________________________________________   Pain Management Discharge Instructions  General Discharge Instructions :  If you need to reach your doctor call: Monday-Friday 8:00 am - 4:00 pm at [phone] or toll free [phone].  After clinic hours [phone] to  have operator reach doctor.  Bring all of your medication bottles to all your appointments in the pain clinic.  To cancel or reschedule your appointment with Pain Management please remember to call 24 hours  in advance to avoid a fee.  Refer to the educational materials which you have been given on: General Risks, I had my Procedure. Discharge Instructions, Post Sedation.  Post Procedure Instructions:  The drugs you were given will stay in your system until tomorrow, so for the next 24 hours you should not drive, make any legal decisions or drink any alcoholic beverages.  You may eat anything you prefer, but it is better to start with liquids then soups and crackers, and gradually work up to solid foods.  Please notify your doctor immediately if you have any unusual bleeding, trouble breathing or pain that is not related to your normal pain.  Depending on the type of procedure that was done, some parts of your body may feel week and/or numb.  This usually clears up by tonight or the next day.  Walk with the use of an assistive device or accompanied by an adult for the 24 hours.  You may use ice on the affected area for the first 24 hours.  Put ice in a Ziploc bag and cover with a towel and place against area 15 minutes on 15 minutes off.  You may switch to heat after 24 hours.

## 2016-04-14 ENCOUNTER — Telehealth: Payer: Self-pay | Admitting: *Deleted

## 2016-04-14 NOTE — Telephone Encounter (Signed)
Voicemail left with patient re; procedure on yesterday, to call our office if there are questions or concerns.  

## 2016-04-19 ENCOUNTER — Ambulatory Visit
Admission: RE | Admit: 2016-04-19 | Discharge: 2016-04-19 | Disposition: A | Discharge status: Home / Self Care | Payer: BLUE CROSS/BLUE SHIELD | Source: Ambulatory Visit | Attending: Orthopedic Surgery | Admitting: Orthopedic Surgery

## 2016-04-19 DIAGNOSIS — M19011 Primary osteoarthritis, right shoulder: Secondary | ICD-10-CM | POA: Diagnosis not present

## 2016-04-19 DIAGNOSIS — M25511 Pain in right shoulder: Secondary | ICD-10-CM | POA: Insufficient documentation

## 2016-05-05 ENCOUNTER — Ambulatory Visit
Admission: RE | Admit: 2016-05-05 | Discharge: 2016-05-05 | Disposition: A | Discharge status: Home / Self Care | Payer: BLUE CROSS/BLUE SHIELD | Source: Ambulatory Visit | Attending: Pain Medicine | Admitting: Pain Medicine

## 2016-05-05 ENCOUNTER — Ambulatory Visit: Payer: BLUE CROSS/BLUE SHIELD | Attending: Pain Medicine | Admitting: Pain Medicine

## 2016-05-05 ENCOUNTER — Telehealth: Payer: Self-pay | Admitting: *Deleted

## 2016-05-05 ENCOUNTER — Encounter: Payer: Self-pay | Admitting: Pain Medicine

## 2016-05-05 VITALS — BP 142/86 | HR 85 | Temp 97.9°F | Resp 16 | Ht 66.0 in | Wt 245.0 lb

## 2016-05-05 DIAGNOSIS — G894 Chronic pain syndrome: Secondary | ICD-10-CM | POA: Diagnosis not present

## 2016-05-05 DIAGNOSIS — M488X6 Other specified spondylopathies, lumbar region: Secondary | ICD-10-CM | POA: Diagnosis not present

## 2016-05-05 DIAGNOSIS — M48061 Spinal stenosis, lumbar region without neurogenic claudication: Secondary | ICD-10-CM | POA: Diagnosis not present

## 2016-05-05 DIAGNOSIS — M961 Postlaminectomy syndrome, not elsewhere classified: Secondary | ICD-10-CM | POA: Diagnosis not present

## 2016-05-05 DIAGNOSIS — Z6839 Body mass index (BMI) 39.0-39.9, adult: Secondary | ICD-10-CM | POA: Diagnosis not present

## 2016-05-05 DIAGNOSIS — F1190 Opioid use, unspecified, uncomplicated: Secondary | ICD-10-CM

## 2016-05-05 DIAGNOSIS — M4696 Unspecified inflammatory spondylopathy, lumbar region: Secondary | ICD-10-CM | POA: Diagnosis not present

## 2016-05-05 DIAGNOSIS — N200 Calculus of kidney: Secondary | ICD-10-CM | POA: Insufficient documentation

## 2016-05-05 DIAGNOSIS — M792 Neuralgia and neuritis, unspecified: Secondary | ICD-10-CM | POA: Diagnosis not present

## 2016-05-05 DIAGNOSIS — Z794 Long term (current) use of insulin: Secondary | ICD-10-CM | POA: Insufficient documentation

## 2016-05-05 DIAGNOSIS — Z79891 Long term (current) use of opiate analgesic: Secondary | ICD-10-CM

## 2016-05-05 DIAGNOSIS — Z833 Family history of diabetes mellitus: Secondary | ICD-10-CM | POA: Diagnosis not present

## 2016-05-05 DIAGNOSIS — G8929 Other chronic pain: Secondary | ICD-10-CM

## 2016-05-05 DIAGNOSIS — M545 Low back pain, unspecified: Secondary | ICD-10-CM

## 2016-05-05 DIAGNOSIS — M1711 Unilateral primary osteoarthritis, right knee: Secondary | ICD-10-CM | POA: Diagnosis not present

## 2016-05-05 DIAGNOSIS — G4730 Sleep apnea, unspecified: Secondary | ICD-10-CM | POA: Insufficient documentation

## 2016-05-05 DIAGNOSIS — Z79899 Other long term (current) drug therapy: Secondary | ICD-10-CM | POA: Diagnosis not present

## 2016-05-05 DIAGNOSIS — Z825 Family history of asthma and other chronic lower respiratory diseases: Secondary | ICD-10-CM | POA: Diagnosis not present

## 2016-05-05 DIAGNOSIS — E119 Type 2 diabetes mellitus without complications: Secondary | ICD-10-CM | POA: Diagnosis not present

## 2016-05-05 DIAGNOSIS — R109 Unspecified abdominal pain: Secondary | ICD-10-CM

## 2016-05-05 DIAGNOSIS — M47816 Spondylosis without myelopathy or radiculopathy, lumbar region: Secondary | ICD-10-CM | POA: Diagnosis not present

## 2016-05-05 DIAGNOSIS — Z8249 Family history of ischemic heart disease and other diseases of the circulatory system: Secondary | ICD-10-CM | POA: Insufficient documentation

## 2016-05-05 DIAGNOSIS — M19011 Primary osteoarthritis, right shoulder: Secondary | ICD-10-CM | POA: Insufficient documentation

## 2016-05-05 DIAGNOSIS — M4726 Other spondylosis with radiculopathy, lumbar region: Secondary | ICD-10-CM | POA: Insufficient documentation

## 2016-05-05 DIAGNOSIS — E785 Hyperlipidemia, unspecified: Secondary | ICD-10-CM | POA: Diagnosis not present

## 2016-05-05 DIAGNOSIS — K589 Irritable bowel syndrome without diarrhea: Secondary | ICD-10-CM | POA: Diagnosis not present

## 2016-05-05 MED ORDER — KETOROLAC TROMETHAMINE 60 MG/2ML IM SOLN
60.0000 mg | Freq: Once | INTRAMUSCULAR | Status: AC
  Administered 2016-05-05: 60 mg via INTRAMUSCULAR

## 2016-05-05 MED ORDER — ORPHENADRINE CITRATE 30 MG/ML IJ SOLN
60.0000 mg | Freq: Once | INTRAMUSCULAR | Status: AC
  Administered 2016-05-05: 60 mg via INTRAMUSCULAR

## 2016-05-05 MED ORDER — ORPHENADRINE CITRATE 30 MG/ML IJ SOLN
INTRAMUSCULAR | Status: AC
  Filled 2016-05-05: qty 2

## 2016-05-05 MED ORDER — OXYCODONE HCL 5 MG PO TABS: 5.0000 mg | ORAL_TABLET | Freq: Four times a day (QID) | ORAL | 0 refills | Status: DC | PRN

## 2016-05-05 MED ORDER — GABAPENTIN 600 MG PO TABS: 600.0000 mg | ORAL_TABLET | Freq: Four times a day (QID) | ORAL | 0 refills | Status: DC

## 2016-05-05 MED ORDER — KETOROLAC TROMETHAMINE 60 MG/2ML IM SOLN
INTRAMUSCULAR | Status: AC
  Filled 2016-05-05: qty 2

## 2016-05-05 NOTE — Progress Notes (Signed)
Nursing Pain Medication Assessment:  Safety precautions to be maintained throughout the outpatient stay will include: orient to surroundings, keep bed in low position, maintain call bell within reach at all times, provide assistance with transfer out of bed and ambulation.  Medication Inspection Compliance: Pill count conducted under aseptic conditions, in front of the patient. Neither the pills nor the bottle was removed from the patient's sight at any time. Once count was completed pills were immediately returned to the patient in their original bottle.  Medication #1: Oxycodone IR Pill/Patch Count: 43 of 120 pills remain Pill/Patch Appearance: Markings consistent with prescribed medication Bottle Appearance: Standard pharmacy container. Clearly labeled. Filled Date: 03 / 26 / 2018 Last Medication intake:  Today  Medication #2: Hydrocodone/APAP Pill/Patch Count: 88 of 120 pills remain Pill/Patch Appearance: Markings consistent with prescribed medication Bottle Appearance: Standard pharmacy container. Clearly labeled. Filled Date: 03 / 28 / 2018 Last Medication intake:  Today

## 2016-05-05 NOTE — Progress Notes (Signed)
Patient's Name: Adam Petty.  MRN: [id number]  Referring Provider: No ref. provider found  DOB: November 30, 1960  PCP: No Pcp Per Patient  DOS: 05/05/2016  Note by: Kaleena Corrow A. Dossie Arbour, MD  Service setting: Ambulatory outpatient  Specialty: Interventional Pain Management  Location: [facility] (AMB) Pain Management Facility    Patient type: Established   Primary Reason(s) for Visit: Encounter for prescription drug management & post-procedure evaluation of chronic illness with mild to moderate exacerbation(Level of risk: moderate) CC: Back Pain (low and right)  HPI  Adam Petty is a 56 y.o. year old, male patient, who comes today for a post-procedure evaluation and medication management. 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); Discogenic syndrome, lumbar (WC injury); Osteoarthritis of spine with radiculopathy, lumbar region (WC injury); Lumbar facet syndrome (WC injury) (Bilateral) (R>L); Lumbosacral radiculopathy (WC injury); Pain in right knee (WC injury); Obesity, Class III, BMI 40-49.9 (morbid obesity) ([place]); Uncomplicated opioid dependence ([place]); Therapeutic opioid-induced constipation (OIC); Dyslipidemia; Microalbuminuria; Adiposity; Lumbar canal stenosis; Type 2 diabetes mellitus ([place]); Long term prescription opiate use; Encounter for chronic pain management; Neurogenic pain; Neuropathic pain; Musculoskeletal pain; Lumbar spondylosis; Chronic knee pain (Location of Secondary source of pain) (Right); Osteoarthritis of knee (Location of Secondary source of pain) (Right); Chronic shoulder pain (Location of Tertiary source of pain) (Right); Osteoarthritis of shoulder (Location of Tertiary source of pain) (Right); Chronic lower  extremity pain (Right); Recurrent nephrolithiasis; Acute medial meniscal tear; Chronic pain syndrome (WC injury); and Acute low back pain without sciatica on his problem list. His primarily concern today is the Back Pain (low and right)  Pain Assessment: Self-Reported Pain Score: 7 /10 Clinically the patient looks like a 4/10 Reported level is inconsistent with clinical observations. Information on the proper use of the pain scale provided to the patient today Pain Location: Back Pain Orientation: Lower, Right Pain Descriptors / Indicators: Sharp, Stabbing Pain Frequency: Intermittent  Adam Petty was last seen on 04/13/2016 for a procedure. During today's appointment we reviewed Adam Petty post-procedure results, as well as his outpatient medication regimen. The patient returns to the clinics today after having had a right sided lumbar facet radiofrequency ablation under fluoroscopic guidance and IV sedation. This provided the patient with excellent relief of his low back pain. Unfortunately, he also has recurrent nephrolithiasis and he comes in today with a flareup of his kidney stones. He is nauseous, and has been waking up in the middle of the night with right sided low back pain. He has minimal pain on hyperextension or rotation suggesting that the radiofrequency did help his low back pain. However, he is having significant pain with percussion over his right kidney. He is also nauseous, which is typical of this kidney stone pain. He agrees that the pain is completely different to his usual facet pain.  Further details on both, my assessment(s), as well as the proposed treatment plan, please see below.  Controlled Substance Pharmacotherapy Assessment REMS (Risk Evaluation and Mitigation Strategy)  Analgesic: Oxycodone IR 5 mg 1 tablet by mouth every 6 hours when necessary for pain (20 mg/day) MME/day:30 mg/day  Angelique Holm, RN  05/05/2016  8:21 AM  Sign at close encounter Nursing Pain  Medication Assessment:  Safety precautions to be maintained throughout the outpatient  stay will include: orient to surroundings, keep bed in low position, maintain call bell within reach at all times, provide assistance with transfer out of bed and ambulation.  Medication Inspection Compliance: Pill count conducted under aseptic conditions, in front of the patient. Neither the pills nor the bottle was removed from the patient's sight at any time. Once count was completed pills were immediately returned to the patient in their original bottle.  Medication #1: Oxycodone IR Pill/Patch Count: 43 of 120 pills remain Pill/Patch Appearance: Markings consistent with prescribed medication Bottle Appearance: Standard pharmacy container. Clearly labeled. Filled Date: 03 / 26 / 2018 Last Medication intake:  Today  Medication #2: Hydrocodone/APAP Pill/Patch Count: 88 of 120 pills remain Pill/Patch Appearance: Markings consistent with prescribed medication Bottle Appearance: Standard pharmacy container. Clearly labeled. Filled Date: 03 / 28 / 2018 Last Medication intake:  Today   Pharmacokinetics: Liberation and absorption (onset of action): WNL Distribution (time to peak effect): WNL Metabolism and excretion (duration of action): WNL         Pharmacodynamics: Desired effects: Analgesia: Adam Petty reports >50% benefit. Functional ability: Patient reports that medication allows him to accomplish basic ADLs Clinically meaningful improvement in function (CMIF): Sustained CMIF goals met Perceived effectiveness: Described as relatively effective, allowing for increase in activities of daily living (ADL) Undesirable effects: Side-effects or Adverse reactions: None reported Monitoring: [place] PMP: Online review of the past 59-monthperiod conducted. Compliant with practice rules and regulations List of all UDS test(s) done:  Lab Results  Component Value Date   TOXASSSELUR FINAL 03/12/2015   TOXASSSELUR  FINAL 12/22/2014   Last UDS on record: ToxAssure Select 13  Date Value Ref Range Status  03/12/2015 FINAL  Final    Comment:    ==================================================================== TOXASSURE SELECT 13 (MW) ==================================================================== Test                             Result       Flag       Units Drug Present and Declared for Prescription Verification   Oxycodone                      892          EXPECTED   ng/mg creat   Oxymorphone                    524          EXPECTED   ng/mg creat   Noroxycodone                   1363         EXPECTED   ng/mg creat   Noroxymorphone                 178          EXPECTED   ng/mg creat    Sources of oxycodone are scheduled prescription medications.    Oxymorphone, noroxycodone, and noroxymorphone are expected    metabolites of oxycodone. Oxymorphone is also available as a    scheduled prescription medication. ==================================================================== Test                      Result    Flag   Units      Ref Range   Creatinine              49  mg/dL      >=20 ==================================================================== Declared Medications:  The flagging and interpretation on this report are based on the  following declared medications.  Unexpected results may arise from  inaccuracies in the declared medications.  **Note: The testing scope of this panel includes these medications:  Oxycodone  **Note: The testing scope of this panel does not include following  reported medications:  Bisacodyl  Canagliflozin (Invokamet)  Gabapentin  Insulin (NovoLog)  Liraglutide (Victoza)  Metformin (Invokamet)  Ramipril  Rosuvastatin  Supplement ==================================================================== For clinical consultation, please call [phone]. ====================================================================    UDS  interpretation: Compliant          Medication Assessment Form: Reviewed. Patient indicates being compliant with therapy Treatment compliance: Compliant Risk Assessment Profile: Aberrant behavior: See prior evaluations. None observed or detected today Comorbid factors increasing risk of overdose: See prior notes. No additional risks detected today Risk of substance use disorder (SUD): Low Opioid Risk Tool (ORT) Total Score: 10  Interpretation Table:  Score <3 = Low Risk for SUD  Score between 4-7 = Moderate Risk for SUD  Score >8 = High Risk for Opioid Abuse   Risk Mitigation Strategies:  Patient Counseling: Covered Patient-Prescriber Agreement (PPA): Present and active  Notification to other healthcare providers: Done  Pharmacologic Plan: No change in therapy, at this time  Post-Procedure Assessment  04/13/2016 Procedure: Right-sided lumbar facet radiofrequency ablation under fluoroscopic guidance and IV sedation Pre-procedure pain score:  5/10 Post-procedure pain score: 0/10 (100% relief) Influential Factors: BMI: 39.54 kg/m Intra-procedural challenges: None observed Assessment challenges: Results reported today are inconsistent with those reported on procedure day, immediately before discharge. Previously the patient had reported 100% relief of the pain, before leaving the facility Post-procedural side-effects, adverse reactions, or complications: None reported Reported issues: None  Sedation: Sedation provided. When no sedatives are used, the analgesic levels obtained are directly associated to the effectiveness of the local anesthetics. However, when sedation is provided, the level of analgesia obtained during the initial 1 hour following the intervention, is believed to be the result of a combination of factors. These factors may include, but are not limited to: 1. The effectiveness of the local anesthetics used. 2. The effects of the analgesic(s) and/or anxiolytic(s) used. 3.  The degree of discomfort experienced by the patient at the time of the procedure. 4. The patients ability and reliability in recalling and recording the events. 5. The presence and influence of possible secondary gains and/or psychosocial factors. Reported result: Relief experienced during the 1st hour after the procedure: 100 % (Ultra-Short Term Relief) Interpretative annotation: Analgesia during this period is likely to be Local Anesthetic and/or IV Sedative (Analgesic/Anxiolitic) related.          Effects of local anesthetic: The analgesic effects attained during this period are directly associated to the localized infiltration of local anesthetics and therefore cary significant diagnostic value as to the etiological location, or anatomical origin, of the pain. Expected duration of relief is directly dependent on the pharmacodynamics of the local anesthetic used. Long-acting (4-6 hours) anesthetics used.  Reported result: Relief during the next 4 to 6 hour after the procedure: 100 % (Short-Term Relief) Interpretative annotation: Complete relief would suggest area to be the source of the pain.          Long-term benefit: Defined as the period of time past the expected duration of local anesthetics. With the possible exception of prolonged sympathetic blockade from the local anesthetics, benefits during this period are typically attributed to,  or associated with, other factors such as analgesic sensory neuropraxia, antiinflammatory effects, or beneficial biochemical changes provided by agents other than the local anesthetics Reported result: Extended relief following procedure: 50 % (2 weeks) (Long-Term Relief) Interpretative annotation: Good relief. This could suggest inflammation to be a significant component in the etiology to the pain.          Current benefits: Defined as persistent relief that continues at this point in time.   Reported results: Treated area: 100 % Mr. Misch reports  improvement in function. The patient did experience 100% relief of the pain on the right side. However, he has also been dealing with recurrent nephrolithiasis with recurrence of some of the pain on that side, however he describes the pain as being completely different to his usual pain. Overall he describes having done great after the radiofrequency except for this pain that has, but this point. I'll ablation today is indicative of kidney stone pain Interpretative annotation: Long-term benefit would suggest adequate RF ablation  Interpretation: Results would suggest a successful therapeutic intervention.          Laboratory Chemistry  Inflammation Markers Lab Results  Component Value Date   CRP 0.6 03/12/2015   ESRSEDRATE 9 03/12/2015   (CRP: Acute Phase) (ESR: Chronic Phase) Renal Function Markers Lab Results  Component Value Date   BUN 15 03/12/2015   CREATININE 0.82 03/12/2015   GFRAA >60 03/12/2015   GFRNONAA >60 03/12/2015   Hepatic Function Markers Lab Results  Component Value Date   AST 41 03/12/2015   ALT 55 03/12/2015   ALBUMIN 4.2 03/12/2015   ALKPHOS 89 03/12/2015   Electrolytes Lab Results  Component Value Date   NA 140 03/12/2015   K 4.2 03/12/2015   CL 105 03/12/2015   CALCIUM 10.0 03/12/2015   MG 1.8 03/12/2015   Neuropathy Markers No results found for: [id number] Bone Pathology Markers Lab Results  Component Value Date   ALKPHOS 89 03/12/2015   CALCIUM 10.0 03/12/2015   Coagulation Parameters Lab Results  Component Value Date   PLT 176 03/13/2015   Cardiovascular Markers Lab Results  Component Value Date   HGB 15.3 03/13/2015   HCT 46.4 03/13/2015   Note: Lab results reviewed.  Recent Diagnostic Imaging Review  Mr Shoulder Right Wo Contrast Result Date: 04/19/2016 CLINICAL DATA:  Right shoulder pain since 01/29/2014. Decreased range of motion. No known injury. History of prior shoulder surgery. EXAM: MRI OF THE RIGHT SHOULDER WITHOUT  CONTRAST TECHNIQUE: Multiplanar, multisequence MR imaging of the shoulder was performed. No intravenous contrast was administered. COMPARISON:  MR arthrogram right shoulder 06/04/2008. FINDINGS: Rotator cuff: Postoperative change of rotator cuff repair is identified. Since the prior MRI, the patient has undergone a second procedure for rotator cuff repair. The rotator cuff is intact. Muscles:  No atrophy or focal lesion. Biceps long head: Status post biceps tenodesis without evidence of complication. Acromioclavicular Joint: The joint has been resected as seen on the prior MRI. No complicating feature. The patient is status post acromioplasty. No fluid in the subacromial/subdeltoid bursa is identified. Glenohumeral Joint: There has been progression of glenohumeral osteoarthritis with a new osteophyte off the inferior margin of the humeral head and progressive cartilage loss about the joint. Labrum: The superior labrum is diminutive with an appearance most suggestive of prior debridement, unchanged. Bones:  No fracture or worrisome lesion. Other: None. IMPRESSION: The rotator cuff is intact with postoperative change of repair identified. The patient is also status post biceps tenodesis without  evidence complication. Progressive glenohumeral osteoarthritis since the prior MRI. Degenerative change appears moderate in degree. Status post acromioplasty and resection of the [facility] joint without evidence of complication. Electronically Signed   By: Inge Rise M.D.   On: 04/19/2016 10:39   Note: Imaging results reviewed.          Meds  The patient has a current medication list which includes the following prescription(s): cyanocobalamin, gabapentin, insulin degludec, insulin degludec, insulin pen needle, liraglutide, metformin, novolog flexpen, oxycodone, oxycodone, potassium citrate, potassium citrate, ramipril, rosuvastatin, and oxycodone.  Current Outpatient Prescriptions on File Prior to Visit  Medication Sig   . cyanocobalamin (,VITAMIN B-12,) 1000 MCG/ML injection Inject into the muscle every 30 (thirty) days.   . insulin degludec (TRESIBA FLEXTOUCH) 100 UNIT/ML SOPN FlexTouch Pen Inject 40 Units into the skin daily.   . Insulin Pen Needle (BD PEN NEEDLE NANO U/F) 32G X 4 MM MISC USE AS DIRECTED. USE THREE TIMES A DAY WITH DIABETES MEDICATION PENS  . liraglutide (VICTOZA) 18 MG/3ML SOPN INJECT 0.3 MLS (1.8 MG TOTAL) SUBCUTANEOUSLY ONCE DAILY.  . metFORMIN (GLUCOPHAGE) 1000 MG tablet TAKE 1 TABLET (1,000 MG TOTAL) BY MOUTH 2 (TWO) TIMES DAILY WITH MEALS.  Marland Kitchen NOVOLOG FLEXPEN 100 UNIT/ML FlexPen Inject 0-10 Units into the skin daily as needed for high blood sugar (sliding scale). Reported on 02/24/2015  . potassium citrate (UROCIT-K) 10 MEQ (1080 MG) SR tablet Take 10 mEq by mouth 2 (two) times daily.  . ramipril (ALTACE) 5 MG capsule Take 5 mg by mouth every morning.   . rosuvastatin (CRESTOR) 5 MG tablet Take 5 mg by mouth 3 (three) times a week. Monday Wednesday Friday   No current facility-administered medications on file prior to visit.    ROS  Constitutional: Denies any fever or chills Gastrointestinal: No reported hemesis, hematochezia, vomiting, or acute GI distress. He is currently nauseous due to an apparent recurrence of a kidney stone. Musculoskeletal: Denies any acute onset joint swelling, redness, loss of ROM, or weakness Neurological: No reported episodes of acute onset apraxia, aphasia, dysarthria, agnosia, amnesia, paralysis, loss of coordination, or loss of consciousness  Allergies  Mr. Faria has No Known Allergies.  [place]  Drug: Mr. Chelf  reports that he does not use drugs. Alcohol:  reports that he drinks alcohol. Tobacco:  reports that he has never smoked. He has never used smokeless tobacco. Medical:  has a past medical history of Arthritis, senescent (10/22/2014); Back pain; Chronic pain syndrome (10/22/2014); Diabetes mellitus without complication ([place]); History of kidney  stones; IBS (irritable bowel syndrome); Lateral meniscus tear; Preventative health care; Sleep apnea; and Spinal stenosis. Family: family history includes COPD in his mother; Cancer in his mother; Diabetes in his father; Heart disease in his father and mother.  Past Surgical History:  Procedure Laterality Date  . CERVICAL FUSION    . CYSTOSCOPY W/ RETROGRADES Right 11/19/2015   Procedure: URETEROSCOPY WITH RETROGRADE PYELOGRAM;  Surgeon: Royston Cowper, MD;  Location: [facility] OR[facility];  Service: Urology;  Laterality: Right;  . FOOT SURGERY     RT  . KNEE ARTHROSCOPY Right 10/02/2013   Procedure: RIGHT KNEE ARTHROSCOPY, PARTIAL LATERAL MENISCECTOMY, DEBRIDEMENT, MEDIAL AND LATERAL CHONDROPLASTY;  Surgeon: Gearlean Alf, MD;  Location: [facility];  Service: Orthopedics;  Laterality: Right;  . KNEE ARTHROSCOPY WITH MEDIAL MENISECTOMY Right 03/25/2015   Procedure: RIGHT KNEE ARTHROSCOPY WITH MENISCAL DEBRIDEMENT and chrodroplasty;  Surgeon: Gaynelle Arabian, MD;  Location: [facility];  Service: Orthopedics;  Laterality: Right;  .  KNEE BURSECTOMY Right 03/25/2015   Procedure: RIGHT KNEE PREPATELLA BURSECTOMY;  Surgeon: Gaynelle Arabian, MD;  Location: [facility];  Service: Orthopedics;  Laterality: Right;  . LUMBAR LAMINECTOMY/DECOMPRESSION MICRODISCECTOMY N/A 02/06/2013   Procedure: MICRO LUMBAR DECOMPRESSION L3-L4 and L4 - L5 ;  Surgeon: Johnn Hai, MD;  Location: [facility];  Service: Orthopedics;  Laterality: N/A;  . PALATE SURGERY     surg for sleep apnea and sinus issues  . SHOULDER SURGERY     x5 on RT / x1 LFT  . TONSILLECTOMY     Constitutional Exam  General appearance: Well nourished, well developed, and well hydrated. In no apparent acute distress Vitals:   05/05/16 0821  BP: (!) 142/86  Pulse: 85  Resp: 16  Temp: 97.9 F (36.6 C)  TempSrc: Oral  SpO2: 98%  Weight: 245 lb (111.1 kg)  Height: '5\' 6"'  (1.676 m)   BMI Assessment: Estimated body mass index is 39.54 kg/m as calculated from the following:    Height as of this encounter: '5\' 6"'  (1.676 m).   Weight as of this encounter: 245 lb (111.1 kg).  BMI interpretation table: BMI level Category Range association with higher incidence of chronic pain  <18 kg/m2 Underweight   18.5-24.9 kg/m2 Ideal body weight   25-29.9 kg/m2 Overweight Increased incidence by 20%  30-34.9 kg/m2 Obese (Class I) Increased incidence by 68%  35-39.9 kg/m2 Severe obesity (Class II) Increased incidence by 136%  >40 kg/m2 Extreme obesity (Class III) Increased incidence by 254%   BMI Readings from Last 4 Encounters:  05/05/16 39.54 kg/m  02/10/16 38.16 kg/m  12/16/15 39.54 kg/m  11/19/15 38.25 kg/m   Wt Readings from Last 4 Encounters:  05/05/16 245 lb (111.1 kg)  02/10/16 240 lb (108.9 kg)  12/16/15 245 lb (111.1 kg)  11/19/15 237 lb (107.5 kg)  Psych/Mental status: Alert, oriented x 3 (person, place, & time)       Eyes: PERLA Respiratory: No evidence of acute respiratory distress  Cervical Spine Exam  Inspection: No masses, redness, or swelling Alignment: Symmetrical Functional ROM: Unrestricted ROM Stability: No instability detected Muscle strength & Tone: Functionally intact Sensory: Unimpaired Palpation: No palpable anomalies              Upper Extremity (UE) Exam    Side: Right upper extremity  Side: Left upper extremity  Inspection: No masses, redness, swelling, or asymmetry. No contractures  Inspection: No masses, redness, swelling, or asymmetry. No contractures  Functional ROM: Unrestricted ROM          Functional ROM: Unrestricted ROM          Muscle strength & Tone: Functionally intact  Muscle strength & Tone: Functionally intact  Sensory: Unimpaired  Sensory: Unimpaired  Palpation: No palpable anomalies              Palpation: No palpable anomalies              Specialized Test(s): Deferred         Specialized Test(s): Deferred          Thoracic Spine Exam  Inspection: No masses, redness, or swelling Alignment:  Symmetrical Functional ROM: Unrestricted ROM Stability: No instability detected Sensory: Unimpaired Muscle strength & Tone: No palpable anomalies  Lumbar Spine Exam  Inspection: No masses, redness, or swelling Alignment: Symmetrical Functional ROM: Unrestricted ROM Stability: No instability detected Muscle strength & Tone: Functionally intact Sensory: Unimpaired Palpation: Positive tenderness to percussion over the right kidney Provocative Tests: Lumbar Hyperextension and  rotation test: Negative       Patrick's Maneuver: evaluation deferred today              Gait & Posture Assessment  Ambulation: Unassisted Gait: Relatively normal for age and body habitus Posture: WNL   Lower Extremity Exam    Side: Right lower extremity  Side: Left lower extremity  Inspection: No masses, redness, swelling, or asymmetry. No contractures  Inspection: No masses, redness, swelling, or asymmetry. No contractures  Functional ROM: Unrestricted ROM          Functional ROM: Unrestricted ROM          Muscle strength & Tone: Functionally intact  Muscle strength & Tone: Functionally intact  Sensory: Unimpaired  Sensory: Unimpaired  Palpation: No palpable anomalies  Palpation: No palpable anomalies   Assessment  Primary Diagnosis & Pertinent Problem List: The primary encounter diagnosis was Acute right-sided low back pain without sciatica. Diagnoses of Recurrent nephrolithiasis, Chronic pain syndrome, Neurogenic pain, Chronic low back pain (WC injury) (Location of Primary Source of Pain) (Bilateral) (R>L), Lumbar facet syndrome (WC injury) (Bilateral) (R>L), Lumbar spondylosis, Long term prescription opiate use, Opiate use (30 MME/Day), and Rt flank pain were also pertinent to this visit.  Status Diagnosis  Recurring Recurring Controlled 1. Acute right-sided low back pain without sciatica   2. Recurrent nephrolithiasis   3. Chronic pain syndrome   4. Neurogenic pain   5. Chronic low back pain (WC  injury) (Location of Primary Source of Pain) (Bilateral) (R>L)   6. Lumbar facet syndrome (WC injury) (Bilateral) (R>L)   7. Lumbar spondylosis   8. Long term prescription opiate use   9. Opiate use (30 MME/Day)   10. Rt flank pain      Plan of Care  Pharmacotherapy (Medications Ordered): Meds ordered this encounter  Medications  . orphenadrine (NORFLEX) injection 60 mg  . ketorolac (TORADOL) injection 60 mg  . oxyCODONE (OXY IR/ROXICODONE) 5 MG immediate release tablet    Sig: Take 1 tablet (5 mg total) by mouth every 6 (six) hours as needed for severe pain.    Dispense:  120 tablet    Refill:  0    Do not place this medication, or any other prescription from our practice, on "Automatic Refill". Patient may have prescription filled one day early if pharmacy is closed on scheduled refill date. Do not fill until: 08/10/16 To last until: 09/09/16  . oxyCODONE (OXY IR/ROXICODONE) 5 MG immediate release tablet    Sig: Take 1 tablet (5 mg total) by mouth every 6 (six) hours as needed for severe pain.    Dispense:  120 tablet    Refill:  0    Do not place this medication, or any other prescription from our practice, on "Automatic Refill". Patient may have prescription filled one day early if pharmacy is closed on scheduled refill date. Do not fill until: 07/11/16 To last until: 08/10/16  . oxyCODONE (OXY IR/ROXICODONE) 5 MG immediate release tablet    Sig: Take 1 tablet (5 mg total) by mouth every 6 (six) hours as needed for severe pain.    Dispense:  120 tablet    Refill:  0    Do not place this medication, or any other prescription from our practice, on "Automatic Refill". Patient may have prescription filled one day early if pharmacy is closed on scheduled refill date. Do not fill until: 06/11/16 To last until: 07/11/16  . gabapentin (NEURONTIN) 600 MG tablet    Sig: Take  1 tablet (600 mg total) by mouth every 6 (six) hours.    Dispense:  360 tablet    Refill:  0    Do not place  this medication, or any other prescription from our practice, on "Automatic Refill". Patient may have prescription filled one day early if pharmacy is closed on scheduled refill date.   New Prescriptions   No medications on file   Medications administered today: We administered orphenadrine and ketorolac. Lab-work, procedure(s), and/or referral(s): Orders Placed This Encounter  Procedures  . DG Abd 1 View  . Ambulatory referral to Urology   Imaging and/or referral(s): AMB REFERRAL TO UROLOGY CT ABDOMEN PELVIS W WO CONTRAST DG ABD 1 VIEW  Interventional therapies: Planned, scheduled, and/or pending:   Not at this time.   Considering:   Palliative bilateral lumbar facet block  Palliative bilateral lumbar facet radiofrequency ablation (Right side done 04/13/2016) (100% relief) Diagnostic right knee genicular nerve block under fluoroscopic guidance, with or without sedation.  Possible right knee genicular nerve radiofrequency ablation under fluoroscopic guidance and IV sedation, the peroneal and the results of the diagnostic injection.    Palliative PRN treatment(s):   None at this time.   Provider-requested follow-up: Return in 4 months (on 08/26/2016) for (Nurse Practitioner) Med-Mgmt.  Future Appointments Date Time Provider [place]  08/25/2016 8:00 AM Crystal Dorrene German, NP [facility] None   Primary Care Physician: No Pcp Per Patient Location: [facility] Note by: Kathlen Brunswick. Dossie Arbour, M.D, DABA, DABAPM, DABPM, DABIPP, FIPP Date: 05/05/2016; Time: 3:19 PM  Patient instructions provided during this appointment: Patient Instructions  Pain Management Discharge Instructions  General Discharge Instructions :  If you need to reach your doctor call: Monday-Friday 8:00 am - 4:00 pm at [phone] or toll free [phone].  After clinic hours [phone] to have operator reach doctor.  Bring all of your medication bottles to all your  appointments in the pain clinic.  To cancel or reschedule your appointment with Pain Management please remember to call 24 hours in advance to avoid a fee.  Refer to the educational materials which you have been given on: General Risks, I had my Procedure. Discharge Instructions, Post Sedation.  Post Procedure Instructions:  The drugs you were given will stay in your system until tomorrow, so for the next 24 hours you should not drive, make any legal decisions or drink any alcoholic beverages.  You may eat anything you prefer, but it is better to start with liquids then soups and crackers, and gradually work up to solid foods.  Please notify your doctor immediately if you have any unusual bleeding, trouble breathing or pain that is not related to your normal pain.  Depending on the type of procedure that was done, some parts of your body may feel week and/or numb.  This usually clears up by tonight or the next day.  Walk with the use of an assistive device or accompanied by an adult for the 24 hours.  You may use ice on the affected area for the first 24 hours.  Put ice in a Ziploc bag and cover with a towel and place against area 15 minutes on 15 minutes off.  You may switch to heat after 24 hours.

## 2016-05-05 NOTE — Patient Instructions (Signed)

## 2016-05-09 NOTE — Progress Notes (Signed)
Results were reviewed and found to be: abnormal  Surgical consultation may be of benefit  Review would suggest no procedures needed at this time 

## 2016-05-10 ENCOUNTER — Telehealth: Payer: Self-pay | Admitting: Pain Medicine

## 2016-05-10 NOTE — Telephone Encounter (Signed)
Will re-route to [facility] to forward to correct personnel. Nursing cannot help in this matter.

## 2016-05-10 NOTE — Telephone Encounter (Addendum)
[employer] approved a bilateral facet block, patient only had one side done then was supposed to come back for 2nd side. WC says they will not pay unless they get a letter stating that the bill received is for both sides and not just one. Patient needs to speak with Chip Boer or Dr. Laban Emperor to get this cleared up so he doesn't get stuck with the bill.

## 2016-06-03 ENCOUNTER — Telehealth: Payer: Self-pay | Admitting: Pain Medicine

## 2016-06-03 NOTE — Telephone Encounter (Signed)
Patient is requesting that Dr. Laban EmperorNaveira do his right shoulder intra articular injection, we received information from [facility] about his request. Please let front office know if we can schedule this appt.

## 2016-06-03 NOTE — Telephone Encounter (Signed)
Olegario MessierKathy, I will discuss with Dr Laban EmperorNaveira on Monday and let you know. Thank you.

## 2016-06-16 ENCOUNTER — Telehealth: Payer: Self-pay | Admitting: Pain Medicine

## 2016-06-16 ENCOUNTER — Other Ambulatory Visit: Payer: Self-pay | Admitting: Pain Medicine

## 2016-06-16 DIAGNOSIS — M19011 Primary osteoarthritis, right shoulder: Secondary | ICD-10-CM | POA: Insufficient documentation

## 2016-06-16 DIAGNOSIS — M25511 Pain in right shoulder: Principal | ICD-10-CM

## 2016-06-16 DIAGNOSIS — G8929 Other chronic pain: Secondary | ICD-10-CM

## 2016-06-16 NOTE — Telephone Encounter (Signed)
Would like to have injection for right shoulder pain. Please call and let us know if he can have procedure and we will schedule

## 2016-06-16 NOTE — Progress Notes (Unsigned)
The patient is currently having increased pain in his right shoulder and he would like him in for an intra-articular right shoulder injection. MRI reviewed today.

## 2016-06-17 ENCOUNTER — Telehealth: Payer: Self-pay

## 2016-06-17 NOTE — Telephone Encounter (Signed)
Dr Laban EmperorNaveira put in orders for shoulder injection per patient request.  Would you please call patient and schedule?   I gave patient pre procedure instructions over the phone.  He does not want sedation.  He is going on vacation next Friday and would love to get this done before he leaves so that he may do activities while on vacation.  I told them we would try our best to get it approved and scheduled next week.  Thank you.

## 2016-06-20 NOTE — Telephone Encounter (Signed)
Spoke with Alona BeneJoyce about insurance and prior approval.  States she will check on it and call to schedule patient accordingly.

## 2016-06-22 ENCOUNTER — Ambulatory Visit (HOSPITAL_BASED_OUTPATIENT_CLINIC_OR_DEPARTMENT_OTHER): Payer: BLUE CROSS/BLUE SHIELD | Admitting: Pain Medicine

## 2016-06-22 ENCOUNTER — Ambulatory Visit
Admission: RE | Admit: 2016-06-22 | Discharge: 2016-06-22 | Disposition: A | Discharge status: Home / Self Care | Payer: BLUE CROSS/BLUE SHIELD | Source: Ambulatory Visit | Attending: Pain Medicine | Admitting: Pain Medicine

## 2016-06-22 ENCOUNTER — Telehealth: Payer: Self-pay | Admitting: Pain Medicine

## 2016-06-22 VITALS — BP 134/98 | HR 79 | Temp 98.3°F | Resp 20 | Ht 66.0 in | Wt 245.0 lb

## 2016-06-22 DIAGNOSIS — M19011 Primary osteoarthritis, right shoulder: Secondary | ICD-10-CM

## 2016-06-22 DIAGNOSIS — M25511 Pain in right shoulder: Secondary | ICD-10-CM

## 2016-06-22 DIAGNOSIS — G8929 Other chronic pain: Secondary | ICD-10-CM | POA: Insufficient documentation

## 2016-06-22 MED ORDER — ROPIVACAINE HCL 2 MG/ML IJ SOLN
4.0000 mL | Freq: Once | INTRAMUSCULAR | Status: AC
  Administered 2016-06-22: 10 mL
  Filled 2016-06-22: qty 10

## 2016-06-22 MED ORDER — LIDOCAINE HCL (PF) 1 % IJ SOLN
INTRAMUSCULAR | Status: AC
  Filled 2016-06-22: qty 5

## 2016-06-22 MED ORDER — METHYLPREDNISOLONE ACETATE 80 MG/ML IJ SUSP
80.0000 mg | Freq: Once | INTRAMUSCULAR | Status: AC
Start: 2016-06-22 — End: 2016-06-22
  Administered 2016-06-22: 80 mg
  Filled 2016-06-22: qty 1

## 2016-06-22 MED ORDER — LIDOCAINE HCL (PF) 1 % IJ SOLN
10.0000 mL | Freq: Once | INTRAMUSCULAR | Status: AC
  Administered 2016-06-22: 5 mL
  Filled 2016-06-22: qty 10

## 2016-06-22 NOTE — Progress Notes (Signed)
Patient's Name: Adam BergFranklin W Risse Jr.  MRN: [id number]  Referring Provider: No ref. provider found  DOB: 04/28/1960  PCP: Patient, No Pcp Per  DOS: 06/22/2016  Note by: Maecie Sevcik A. Laban EmperorNaveira, MD  Service setting: Ambulatory outpatient  Location: [facility] (AMB) Pain Management Facility  Visit type: Procedure  Specialty: Interventional Pain Management  Patient type: Established   Primary Reason for Visit: Interventional Pain Management Treatment. CC: Shoulder Pain (right)  Procedure:  Anesthesia, Analgesia, Anxiolysis:  Type: Diagnostic Suprascapular nerve Block Region: Posterior Shoulder & Scapular Areas Level: Superior to the scapular spine, in the lateral aspect of the supraspinatus fossa (Suprescapular notch). Laterality: Right-Side  Type: Local Anesthesia Local Anesthetic: Lidocaine 1% Route: Infiltration ([place]/IM) IV Access: Declined Sedation: Declined  Indication(s): Analgesia          Indications: 1. Chronic shoulder pain (Location of Tertiary source of pain) (Right)   2. Primary osteoarthritis of right shoulder    Pain Score: Pre-procedure: 4 /10 Post-procedure: 5 /10  Pre-op Assessment:  Previous date of service: 05/05/16 Service provided: Med Refill, Evaluation (post procedure eval) Adam Petty is a 56 y.o. (year old), male patient, seen today for interventional treatment. He  has a past surgical history that includes Tonsillectomy; Shoulder surgery; Palate surgery; Cervical fusion; Foot surgery; Lumbar laminectomy/decompression microdiscectomy (N/A, 02/06/2013); Knee arthroscopy (Right, 10/02/2013); Knee arthroscopy with medial menisectomy (Right, 03/25/2015); Knee bursectomy (Right, 03/25/2015); and Cystoscopy w/ retrogrades (Right, 11/19/2015). His primarily concern today is the Shoulder Pain (right)  Initial Vital Signs: Blood pressure 128/79, pulse 80, temperature 98.3 F (36.8 C), resp. rate 16, height 5\' 6"  (1.676 m), weight 245 lb (111.1 kg), SpO2 100 %. BMI: 39.54 kg/m  Risk  Assessment: Allergies: Reviewed. He has No Known Allergies.  Allergy Precautions: None required Coagulopathies: Reviewed. None identified.  Blood-thinner therapy: None at this time Active Infection(s): Reviewed. None identified. Adam Petty is afebrile  Site Confirmation: Adam Petty was asked to confirm the procedure and laterality before marking the site Procedure checklist: Completed Consent: Before the procedure and under the influence of no sedative(s), amnesic(s), or anxiolytics, the patient was informed of the treatment options, risks and possible complications. To fulfill our ethical and legal obligations, as recommended by the American Medical Association's Code of Ethics, I have informed the patient of my clinical impression; the nature and purpose of the treatment or procedure; the risks, benefits, and possible complications of the intervention; the alternatives, including doing nothing; the risk(s) and benefit(s) of the alternative treatment(s) or procedure(s); and the risk(s) and benefit(s) of doing nothing. The patient was provided information about the general risks and possible complications associated with the procedure. These may include, but are not limited to: failure to achieve desired goals, infection, bleeding, organ or nerve damage, allergic reactions, paralysis, and death. In addition, the patient was informed of those risks and complications associated to the procedure, such as failure to decrease pain; infection; bleeding; organ or nerve damage with subsequent damage to sensory, motor, and/or autonomic systems, resulting in permanent pain, numbness, and/or weakness of one or several areas of the body; allergic reactions; (i.e.: anaphylactic reaction); and/or death. Furthermore, the patient was informed of those risks and complications associated with the medications. These include, but are not limited to: allergic reactions (i.e.: anaphylactic or anaphylactoid reaction(s));  adrenal axis suppression; blood sugar elevation that in diabetics may result in ketoacidosis or comma; water retention that in patients with history of congestive heart failure may result in shortness of breath, pulmonary edema, and decompensation with  resultant heart failure; weight gain; swelling or edema; medication-induced neural toxicity; particulate matter embolism and blood vessel occlusion with resultant organ, and/or nervous system infarction; and/or aseptic necrosis of one or more joints. Finally, the patient was informed that Medicine is not an exact science; therefore, there is also the possibility of unforeseen or unpredictable risks and/or possible complications that may result in a catastrophic outcome. The patient indicated having understood very clearly. We have given the patient no guarantees and we have made no promises. Enough time was given to the patient to ask questions, all of which were answered to the patient's satisfaction. Adam Petty has indicated that he wanted to continue with the procedure. Attestation: I, the ordering provider, attest that I have discussed with the patient the benefits, risks, side-effects, alternatives, likelihood of achieving goals, and potential problems during recovery for the procedure that I have provided informed consent. Date: 06/22/2016; Time: 7:57 AM  Pre-Procedure Preparation:  Monitoring: As per clinic protocol. Respiration, ETCO2, SpO2, BP, heart rate and rhythm monitor placed and checked for adequate function Safety Precautions: Patient was assessed for positional comfort and pressure points before starting the procedure. Time-out: I initiated and conducted the "Time-out" before starting the procedure, as per protocol. The patient was asked to participate by confirming the accuracy of the "Time Out" information. Verification of the correct person, site, and procedure were performed and confirmed by me, the nursing staff, and the patient.  "Time-out" conducted as per Joint Commission's Universal Protocol (UP.01.01.01). "Time-out" Date & Time: 06/22/2016; 0823 hrs.  Description of Procedure Process:   Position: Prone Target Area: Suprascapular notch. Approach: Posterior approach. Area Prepped: Entire shoulder Area Prepping solution: ChloraPrep (2% chlorhexidine gluconate and 70% isopropyl alcohol) Safety Precautions: Aspiration looking for blood return was conducted prior to all injections. At no point did we inject any substances, as a needle was being advanced. No attempts were made at seeking any paresthesias. Safe injection practices and needle disposal techniques used. Medications properly checked for expiration dates. SDV (single dose vial) medications used. Description of the Procedure: Protocol guidelines were followed. The patient was placed in position over the procedure table. The target area was identified and the area prepped in the usual manner. Skin & deeper tissues infiltrated with local anesthetic. Appropriate amount of time allowed to pass for local anesthetics to take effect. The procedure needles were then advanced to the target area. Proper needle placement secured. Negative aspiration confirmed. Solution injected in intermittent fashion, asking for systemic symptoms every 0.5cc of injectate. The needles were then removed and the area cleansed, making sure to leave some of the prepping solution back to take advantage of its long term bactericidal properties. Vitals:   06/22/16 0814 06/22/16 0818 06/22/16 0823 06/22/16 0828  BP: (!) 143/91 (!) 140/95 (!) 130/97 (!) 134/98  Pulse: 79     Resp: 19 (!) 21 20 20   Temp:      SpO2: 100% 97% 96% 96%  Weight:      Height:        Start Time: 0824 hrs. End Time: 0828 hrs. Materials:  Needle(s) Type: Regular needle Gauge: 22G Length: 3.5-in Medication(s): We administered methylPREDNISolone acetate, lidocaine (PF), and ropivacaine (PF) 2 mg/mL (0.2%). Please see chart  orders for dosing details.  Imaging Guidance (Non-Spinal):  Type of Imaging Technique: Fluoroscopy Guidance (Non-Spinal) Indication(s): Assistance in needle guidance and placement for procedures requiring needle placement in or near specific anatomical locations not easily accessible without such assistance. Exposure Time: Please see nurses  notes. Contrast: Before injecting any contrast, we confirmed that the patient did not have an allergy to iodine, shellfish, or radiological contrast. Once satisfactory needle placement was completed at the desired level, radiological contrast was injected. Contrast injected under live fluoroscopy. No contrast complications. See chart for type and volume of contrast used. Fluoroscopic Guidance: I was personally present during the use of fluoroscopy. "Tunnel Vision Technique" used to obtain the best possible view of the target area. Parallax error corrected before commencing the procedure. "Direction-depth-direction" technique used to introduce the needle under continuous pulsed fluoroscopy. Once target was reached, antero-posterior, oblique, and lateral fluoroscopic projection used confirm needle placement in all planes. Images permanently stored in EMR. Interpretation: I personally interpreted the imaging intraoperatively. Adequate needle placement confirmed in multiple planes. Appropriate spread of contrast into desired area was observed. No evidence of afferent or efferent intravascular uptake. Permanent images saved into the patient's record.  Antibiotic Prophylaxis:  Indication(s): None identified Antibiotic given: None  Post-operative Assessment:  EBL: None Complications: No immediate post-treatment complications observed by team, or reported by patient. Note: The patient tolerated the entire procedure well. A repeat set of vitals were taken after the procedure and the patient was kept under observation following institutional policy, for this type of  procedure. Post-procedural neurological assessment was performed, showing return to baseline, prior to discharge. The patient was provided with post-procedure discharge instructions, including a section on how to identify potential problems. Should any problems arise concerning this procedure, the patient was given instructions to immediately contact us, at any time, without hesitation. In any case, we plan to contact the patient by telephone for a follow-up status report regarding this interventional procedure. Comments:  No additional relevant information.  Plan of Care  Disposition: Discharge home  Discharge Date & Time: 06/22/2016; 0831 hrs.  Physician-requested Follow-up:  Return for post-procedure eval (in 2 wks), by MD, in addition, Med-Mgmt, w/ NP.  Future Appointments Date Time Provider Department Center  07/12/2016 8:00 AM Delano Metz, MD [facility] None  08/25/2016 8:00 AM Barbette Merino, NP [facility] None   Medications ordered for procedure: Meds ordered this encounter  Medications  . methylPREDNISolone acetate (DEPO-MEDROL) injection 80 mg  . lidocaine (PF) (XYLOCAINE) 1 % injection 10 mL  . ropivacaine (PF) 2 mg/mL (0.2%) (NAROPIN) injection 4 mL   Medications administered: We administered methylPREDNISolone acetate, lidocaine (PF), and ropivacaine (PF) 2 mg/mL (0.2%).  See the medical record for exact dosing, route, and time of administration.  Lab-work, Procedure(s), & Referral(s) Ordered: Orders Placed This Encounter  Procedures  . DG C-Arm 1-60 Min-No Report  . Informed Consent Details: Transcribe to consent form and obtain patient signature  . Provider attestation of informed consent for procedure/surgical case  . Verify informed consent  . Discharge instructions  . Follow-up   Imaging Ordered: Results for orders placed in visit on 04/13/16  DG C-Arm 1-60 Min-No Report   Narrative Fluoroscopy was utilized by the requesting physician.  No radiographic   interpretation.    New Prescriptions   No medications on file   Primary Care Physician: Patient, No Pcp Per Location: [facility] Note by: Darcel Frane A. Laban Emperor, M.D, DABA, DABAPM, DABPM, DABIPP, FIPP Date: 06/22/2016; Time: 1:08 PM  Disclaimer:  Medicine is not an Visual merchandiser. The only guarantee in medicine is that nothing is guaranteed. It is important to note that the decision to proceed with this intervention was based on the information collected from the patient. The Data and conclusions  were drawn from the patient's questionnaire, the interview, and the physical examination. Because the information was provided in large part by the patient, it cannot be guaranteed that it has not been purposely or unconsciously manipulated. Every effort has been made to obtain as much relevant data as possible for this evaluation. It is important to note that the conclusions that lead to this procedure are derived in large part from the available data. Always take into account that the treatment will also be dependent on availability of resources and existing treatment guidelines, considered by other Pain Management Practitioners as being common knowledge and practice, at the time of the intervention. For Medico-Legal purposes, it is also important to point out that variation in procedural techniques and pharmacological choices are the acceptable norm. The indications, contraindications, technique, and results of the above procedure should only be interpreted and judged by a Board-Certified Interventional Pain Specialist with extensive familiarity and expertise in the same exact procedure and technique.  Instructions provided at this appointment: Patient Instructions   ____________________________________________________________________________________________  Post-Procedure instructions Instructions:  Apply ice: Fill a plastic sandwich bag with crushed ice. Cover it with a  small towel and apply to injection site. Apply for 15 minutes then remove x 15 minutes. Repeat sequence on day of procedure, until you go to bed. The purpose is to minimize swelling and discomfort after procedure.  Apply heat: Apply heat to procedure site starting the day following the procedure. The purpose is to treat any soreness and discomfort from the procedure.  Food intake: Start with clear liquids (like water) and advance to regular food, as tolerated.   Physical activities: Keep activities to a minimum for the first 8 hours after the procedure.   Driving: If you have received any sedation, you are not allowed to drive for 24 hours after your procedure.  Blood thinner: Restart your blood thinner 6 hours after your procedure. (Only for those taking blood thinners)  Insulin: As soon as you can eat, you may resume your normal dosing schedule. (Only for those taking insulin)  Infection prevention: Keep procedure site clean and dry.  Post-procedure Pain Diary: Extremely important that this be done correctly and accurately. Recorded information will be used to determine the next step in treatment.  Pain evaluated is that of treated area only. Do not include pain from an untreated area.  Complete every hour, on the hour, for the initial 8 hours. Set an alarm to help you do this part accurately.  Do not go to sleep and have it completed later. It will not be accurate.  Follow-up appointment: Keep your follow-up appointment after the procedure. Usually 2 weeks for most procedures. (6 weeks in the case of radiofrequency.) Bring you pain diary.  Expect:  From numbing medicine (AKA: Local Anesthetics): Numbness or decrease in pain.  Onset: Full effect within 15 minutes of injected.  Duration: It will depend on the type of local anesthetic used. On the average, 1 to 8 hours.   From steroids: Decrease in swelling or inflammation. Once inflammation is improved, relief of the pain will  follow.  Onset of benefits: Depends on the amount of swelling present. The more swelling, the longer it will take for the benefits to be seen.   Duration: Steroids will stay in the system x 2 weeks. Duration of benefits will depend on multiple posibilities including persistent irritating factors.  From procedure: Some discomfort is to be expected once the numbing medicine wears off. This should be minimal if  ice and heat are applied as instructed. Call if:  You experience numbness and weakness that gets worse with time, as opposed to wearing off.  New onset bowel or bladder incontinence. (Spinal procedures only)  Emergency Numbers:  Durning business hours (Monday - Thursday, 8:00 AM - 4:00 PM) (Friday, 9:00 AM - 12:00 Noon): [phone]  After hours: [phone] ____________________________________________________________________________________________  Post-procedure Information What to expect: Most procedures involve the use of a local anesthetic (numbing medicine), and a steroid (anti-inflammatory medicine).  The local anesthetics may cause temporary numbness and weakness of the legs or arms, depending on the location of the block. This numbness/weakness may last 4-6 hours, depending on the local anesthetic used. In rare instances, it can last up to 24 hours. While numb, you must be very careful not to injure the extremity.  After any procedure, you could expect the pain to get better within 15-20 minutes. This relief is temporary and may last 4-6 hours. Once the local anesthetics wears off, you could experience discomfort, possibly more than usual, for up to 10 (ten) days. In the case of radiofrequencies, it may last up to 6 weeks. Surgeries may take up to 8 weeks for the healing process. The discomfort is due to the irritation caused by needles going through skin and muscle. To minimize the discomfort, we recommend using ice the first day, and heat from then on. The ice should  be applied for 15 minutes on, and 15 minutes off. Keep repeating this cycle until bedtime. Avoid applying the ice directly to the skin, to prevent frostbite. Heat should be used daily, until the pain improves (4-10 days). Be careful not to burn yourself.  Occasionally you may experience muscle spasms or cramps. These occur as a consequence of the irritation caused by the needle sticks to the muscle and the blood that will inevitably be lost into the surrounding muscle tissue. Blood tends to be very irritating to tissues, which tend to react by going into spasm. These spasms may start the same day of your procedure, but they may also take days to develop. This late onset type of spasm or cramp is usually caused by electrolyte imbalances triggered by the steroids, at the level of the kidney. Cramps and spasms tend to respond well to muscle relaxants, multivitamins (some are triggered by the procedure, but may have their origins in vitamin deficiencies), and "Gatorade", or any sports drinks that can replenish any electrolyte imbalances. (If you are a diabetic, ask your pharmacist to get you a sugar-free brand.) Warm showers or baths may also be helpful. Stretching exercises are highly recommended. General Instructions:  Be alert for signs of possible infection: redness, swelling, heat, red streaks, elevated temperature, and/or fever. These typically appear 4 to 6 days after the procedure. Immediately notify your doctor if you experience unusual bleeding, difficulty breathing, or loss of bowel or bladder control. If you experience increased pain, do not increase your pain medicine intake, unless instructed by your pain physician. Post-Procedure Care:  Be careful in moving about. Muscle spasms in the area of the injection may occur. Applying ice or heat to the area is often helpful. The incidence of spinal headaches after epidural injections ranges between 1.4% and 6%. If you develop a headache that does not seem  to respond to conservative therapy, please let your physician know. This can be treated with an epidural blood patch.   Post-procedure numbness or redness is to be expected, however it should average 4 to 6  hours. If numbness and weakness of your extremities begins to develop 4 to 6 hours after your procedure, and is felt to be progressing and worsening, immediately contact your physician.   Diet:  If you experience nausea, do not eat until this sensation goes away. If you had a "Stellate Ganglion Block" for upper extremity "Reflex Sympathetic Dystrophy", do not eat or drink until your hoarseness goes away. In any case, always start with liquids first and if you tolerate them well, then slowly progress to more solid foods. Activity:  For the first 4 to 6 hours after the procedure, use caution in moving about as you may experience numbness and/or weakness. Use caution in cooking, using household electrical appliances, and climbing steps. If you need to reach your Doctor call our office: [phone] Monday-Thursday 8:00 am - 4:00 PM    Fridays: Closed     In case of an emergency: In case of emergency, call 911 or go to the nearest emergency room and have the physician there call us.  Interpretation of Procedure Every nerve block has two components: a diagnostic component, and a treatment component. Unrealistic expectations are the most common causes of "perceived failure".  In a perfect world, a single nerve block should be able to completely and permanently eliminate the pain. Sadly, the world is not perfect.  Most pain management nerve blocks are performed using local anesthetics and steroids. Steroids are responsible for any long-term benefit that you may experience. Their purpose is to decrease any chronic swelling that may exist in the area. Steroids begin to work immediately after being injected. However, most patients will not experience any benefits until 5 to 10 days after the injection,  when the swelling has come down to the point where they can tell a difference. Steroids will only help if there is swelling to be treated. As such, they can assist with the diagnosis. If effective, they suggest an inflammatory component to the pain, and if ineffective, they rule out inflammation as the main cause or component of the problem. If the problem is one of mechanical compression, you will get no benefit from those steroids.   In the case of local anesthetics, they have a crucial role in the diagnosis of your condition. Most will begin to work within15 to 20 minutes after injection. The duration will depend on the type used (short- vs. Long-acting). It is of outmost importance that patients keep tract of their pain, after the procedure. To assist with this matter, a "Post-procedure Pain Diary" is provided. Make sure to complete it and to bring it back to your follow-up appointment.  As long as the patient keeps accurate, detailed records of their symptoms after every procedure, and returns to have those interpreted, every procedure will provide [place] with invaluable information. Even a block that does not provide the patient with any relief, will always provide [place] with information about the mechanism and the origin of the pain. The only time a nerve block can be considered a waste of time is when patients do not keep track of the results, or do not keep their post-procedure appointment.  Reporting the results back to your physician The Pain Score  Pain is a subjective complaint. It cannot be seen, touched, or measured. We depend entirely on the patient's report of the pain in order to assess your condition and treatment. To evaluate the pain, we use a pain scale, where "0" means "No Pain", and a "10" is "the worst possible pain  that you can even imagine" (i.e. something like been eaten alive by a shark or being torn apart by a lion).   You will frequently be asked to rate your pain. Please be as  accurate, remember that medical decisions will be based on your responses. Please do not rate your pain above a 10. Doing so is actually interpreted as "symptom magnification" (exaggeration), as well as lack of understanding with regards to the scale. To put this into perspective, when you tell us that your pain is at a 10 (ten), what you are saying is that there is nothing we can do to make this pain any worse. (Carefully think about that.) Post-Procedure Pain Diary   Name: Date of Service Procedure      Time Period Pain Score Painful Area  Pre-procedure ____/10   Time Period Pain Score Area improved. Area not improved.  15 to 30 min post-procedure ____/10    1st hour after procedure ____/10    2nd hour after procedure ____/10    3rd hour after procedure ____/10    4th hour after procedure ____/10    5th hour after procedure ____/10    6th hour after procedure ____/10    7th hour after procedure ____/10    Time Period Pain Score Area improved. Area not improved.  Note: From here on, always document your pain score 1st thing in the morning.  1st day after procedure ____/10    2nd day after procedure ____/10    3rd day after procedure ____/10    4th day after procedure ____/10    5th day after procedure ____/10    Time Period Pain Score Area improved. Area not improved.  10th day after procedure ____/10    20th day after procedure ____/10     Benefits Indicate for each set of activities if the procedure changes your ability to accomplish them.  Activity Worse No-Change Improved  Dressing, eating, walking, toileting, hygiene     Shopping, housekeeping, food preparation, community transportation     Range of motion of affected area      Your opinion Please indicate which statement best describes your impression of this treatment.  Statement (X)  Based on the results, I am encouraged.   Based on the results, I am disappointed.   I am not sure I have an opinion at this point.     Note: Make sure to complete and return this form to your physician, on your follow-up appointment. This information will be used to interpret the results. Failure to accurately complete, or to return this information, may result in less than optimal outcomes.

## 2016-06-22 NOTE — Telephone Encounter (Signed)
Would like to speak with someone about procedure he had today, please call patient

## 2016-06-22 NOTE — Patient Instructions (Addendum)
Post-Procedure instructions Instructions:  Apply ice: Fill a plastic sandwich bag with crushed ice. Cover it with a small towel and apply to injection site. Apply for 15 minutes then remove x 15 minutes. Repeat sequence on day of procedure, until you go to bed. The purpose is to minimize swelling and discomfort after procedure.  Apply heat: Apply heat to procedure site starting the day following the procedure. The purpose is to treat any soreness and discomfort from the procedure.  Food intake: Start with clear liquids (like water) and advance to regular food, as tolerated.   Physical activities: Keep activities to a minimum for the first 8 hours after the procedure.   Driving: If you have received any sedation, you are not allowed to drive for 24 hours after your procedure.  Blood thinner: Restart your blood thinner 6 hours after your procedure. (Only for those taking blood thinners)  Insulin: As soon as you can eat, you may resume your normal dosing schedule. (Only for those taking insulin)  Infection prevention: Keep procedure site clean and dry.  Post-procedure Pain Diary: Extremely important that this be done correctly and accurately. Recorded information will be used to determine the next step in treatment.  Pain evaluated is that of treated area only. Do not include pain from an untreated area.  Complete every hour, on the hour, for the initial 8 hours. Set an alarm to help you do this part accurately.  Do not go to sleep and have it completed later. It will not be accurate.  Follow-up appointment: Keep your follow-up appointment after the procedure. Usually 2 weeks for most procedures. (6 weeks in the case of radiofrequency.) Bring you pain diary.  Expect:  From numbing medicine (AKA: Local Anesthetics): Numbness or decrease in pain.  Onset: Full effect within 15 minutes of injected.  Duration: It will depend on the type of local anesthetic used. On the average, 1 to 8  hours.   From steroids: Decrease in swelling or inflammation. Once inflammation is improved, relief of the pain will follow.  Onset of benefits: Depends on the amount of swelling present. The more swelling, the longer it will take for the benefits to be seen.   Duration: Steroids will stay in the system x 2 weeks. Duration of benefits will depend on multiple posibilities including persistent irritating factors.  From procedure: Some discomfort is to be expected once the numbing medicine wears off. This should be minimal if ice and heat are applied as instructed. Call if:  You experience numbness and weakness that gets worse with time, as opposed to wearing off.  New onset bowel or bladder incontinence. (Spinal procedures only)  Emergency Numbers:  Durning business hours (Monday - Thursday, 8:00 AM - 4:00 PM) (Friday, 9:00 AM - 12:00 Noon): [phone]  After hours: [phone]   __________________________________________________________________________________________   Post-procedure Information What to expect: Most procedures involve the use of a local anesthetic (numbing medicine), and a steroid (anti-inflammatory medicine).  The local anesthetics may cause temporary numbness and weakness of the legs or arms, depending on the location of the block. This numbness/weakness may last 4-6 hours, depending on the local anesthetic used. In rare instances, it can last up to 24 hours. While numb, you must be very careful not to injure the extremity.  After any procedure, you could expect the pain to get better within 15-20 minutes. This relief is temporary and may last 4-6 hours. Once the local anesthetics wears off, you could experience discomfort, possibly   more than usual, for up to 10 (ten) days. In the case of radiofrequencies, it may last up to 6 weeks. Surgeries may take up to 8 weeks for the healing process. The discomfort is due to the irritation caused by needles going through  skin and muscle. To minimize the discomfort, we recommend using ice the first day, and heat from then on. The ice should be applied for 15 minutes on, and 15 minutes off. Keep repeating this cycle until bedtime. Avoid applying the ice directly to the skin, to prevent frostbite. Heat should be used daily, until the pain improves (4-10 days). Be careful not to burn yourself.  Occasionally you may experience muscle spasms or cramps. These occur as a consequence of the irritation caused by the needle sticks to the muscle and the blood that will inevitably be lost into the surrounding muscle tissue. Blood tends to be very irritating to tissues, which tend to react by going into spasm. These spasms may start the same day of your procedure, but they may also take days to develop. This late onset type of spasm or cramp is usually caused by electrolyte imbalances triggered by the steroids, at the level of the kidney. Cramps and spasms tend to respond well to muscle relaxants, multivitamins (some are triggered by the procedure, but may have their origins in vitamin deficiencies), and "Gatorade", or any sports drinks that can replenish any electrolyte imbalances. (If you are a diabetic, ask your pharmacist to get you a sugar-free brand.) Warm showers or baths may also be helpful. Stretching exercises are highly recommended. General Instructions:  Be alert for signs of possible infection: redness, swelling, heat, red streaks, elevated temperature, and/or fever. These typically appear 4 to 6 days after the procedure. Immediately notify your doctor if you experience unusual bleeding, difficulty breathing, or loss of bowel or bladder control. If you experience increased pain, do not increase your pain medicine intake, unless instructed by your pain physician. Post-Procedure Care:  Be careful in moving about. Muscle spasms in the area of the injection may occur. Applying ice or heat to the area is often helpful. The incidence  of spinal headaches after epidural injections ranges between 1.4% and 6%. If you develop a headache that does not seem to respond to conservative therapy, please let your physician know. This can be treated with an epidural blood patch.   Post-procedure numbness or redness is to be expected, however it should average 4 to 6 hours. If numbness and weakness of your extremities begins to develop 4 to 6 hours after your procedure, and is felt to be progressing and worsening, immediately contact your physician.   Diet:  If you experience nausea, do not eat until this sensation goes away. If you had a "Stellate Ganglion Block" for upper extremity "Reflex Sympathetic Dystrophy", do not eat or drink until your hoarseness goes away. In any case, always start with liquids first and if you tolerate them well, then slowly progress to more solid foods. Activity:  For the first 4 to 6 hours after the procedure, use caution in moving about as you may experience numbness and/or weakness. Use caution in cooking, using household electrical appliances, and climbing steps. If you need to reach your Doctor call our office: [phone] Monday-Thursday 8:00 am - 4:00 PM    Fridays: Closed     In case of an emergency: In case of emergency, call 911 or go to the nearest emergency room and have the physician there call us.    Interpretation of Procedure Every nerve block has two components: a diagnostic component, and a treatment component. Unrealistic expectations are the most common causes of "perceived failure".  In a perfect world, a single nerve block should be able to completely and permanently eliminate the pain. Sadly, the world is not perfect.  Most pain management nerve blocks are performed using local anesthetics and steroids. Steroids are responsible for any long-term benefit that you may experience. Their purpose is to decrease any chronic swelling that may exist in the area. Steroids begin to work immediately  after being injected. However, most patients will not experience any benefits until 5 to 10 days after the injection, when the swelling has come down to the point where they can tell a difference. Steroids will only help if there is swelling to be treated. As such, they can assist with the diagnosis. If effective, they suggest an inflammatory component to the pain, and if ineffective, they rule out inflammation as the main cause or component of the problem. If the problem is one of mechanical compression, you will get no benefit from those steroids.   In the case of local anesthetics, they have a crucial role in the diagnosis of your condition. Most will begin to work within15 to 20 minutes after injection. The duration will depend on the type used (short- vs. Long-acting). It is of outmost importance that patients keep tract of their pain, after the procedure. To assist with this matter, a "Post-procedure Pain Diary" is provided. Make sure to complete it and to bring it back to your follow-up appointment.  As long as the patient keeps accurate, detailed records of their symptoms after every procedure, and returns to have those interpreted, every procedure will provide us with invaluable information. Even a block that does not provide the patient with any relief, will always provide us with information about the mechanism and the origin of the pain. The only time a nerve block can be considered a waste of time is when patients do not keep track of the results, or do not keep their post-procedure appointment.  Reporting the results back to your physician The Pain Score  Pain is a subjective complaint. It cannot be seen, touched, or measured. We depend entirely on the patient's report of the pain in order to assess your condition and treatment. To evaluate the pain, we use a pain scale, where "0" means "No Pain", and a "10" is "the worst possible pain that you can even imagine" (i.e. something like been eaten  alive by a shark or being torn apart by a lion).   You will frequently be asked to rate your pain. Please be as accurate, remember that medical decisions will be based on your responses. Please do not rate your pain above a 10. Doing so is actually interpreted as "symptom magnification" (exaggeration), as well as lack of understanding with regards to the scale. To put this into perspective, when you tell us that your pain is at a 10 (ten), what you are saying is that there is nothing we can do to make this pain any worse. (Carefully think about that.) Post-Procedure Pain Diary   Name: Date of Service Procedure      Time Period Pain Score Painful Area  Pre-procedure ____/10   Time Period Pain Score Area improved. Area not improved.  15 to 30 min post-procedure ____/10    1st hour after procedure ____/10    2nd hour after procedure ____/10    3rd hour   after procedure ____/10    4th hour after procedure ____/10    5th hour after procedure ____/10    6th hour after procedure ____/10    7th hour after procedure ____/10    Time Period Pain Score Area improved. Area not improved.  Note: From here on, always document your pain score 1st thing in the morning.  1st day after procedure ____/10    2nd day after procedure ____/10    3rd day after procedure ____/10    4th day after procedure ____/10    5th day after procedure ____/10    Time Period Pain Score Area improved. Area not improved.  10th day after procedure ____/10    20th day after procedure ____/10     Benefits Indicate for each set of activities if the procedure changes your ability to accomplish them.  Activity Worse No-Change Improved  Dressing, eating, walking, toileting, hygiene     Shopping, housekeeping, food preparation, community transportation     Range of motion of affected area      Your opinion Please indicate which statement best describes your impression of this treatment.  Statement (X)  Based on the results, I  am encouraged.   Based on the results, I am disappointed.   I am not sure I have an opinion at this point.    Note: Make sure to complete and return this form to your physician, on your follow-up appointment. This information will be used to interpret the results. Failure to accurately complete, or to return this information, may result in less than optimal outcomes.  

## 2016-06-22 NOTE — Telephone Encounter (Signed)
Spoke with patient.  States he had procedure a few hours ago and was having throbbing worse than before procedure.  Spoke with Dr Laban EmperorNaveira.  Instructed patient to ice area, and to call for any fever, redness, or if no improvement by morning.

## 2016-06-22 NOTE — Progress Notes (Signed)
Safety precautions to be maintained throughout the outpatient stay will include: orient to surroundings, keep bed in low position, maintain call bell within reach at all times, provide assistance with transfer out of bed and ambulation.  

## 2016-06-22 NOTE — Telephone Encounter (Signed)
Called patient, [facility]

## 2016-06-23 ENCOUNTER — Telehealth: Payer: Self-pay

## 2016-06-23 NOTE — Telephone Encounter (Signed)
No answer- Left message to call if needed 

## 2016-07-11 NOTE — Progress Notes (Signed)
Patient's Name: Adam Petty.  MRN: [id number]  Referring Provider: No ref. provider found  DOB: 1960/01/31  PCP: Patient, No Pcp Per  DOS: 07/12/2016  Note by: Francisco A. Dossie Arbour, MD  Service setting: Ambulatory outpatient  Specialty: Interventional Pain Management  Location: [facility] (AMB) Pain Management Facility    Patient type: Established   Primary Reason(s) for Visit: Encounter for post-procedure evaluation of chronic illness with mild to moderate exacerbation CC: Shoulder Pain (right); Back Pain (lower); and Knee Pain (bilaterally)  HPI  [name] is a 56 y.o. year old, male patient, who comes today for a post-procedure evaluation. 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); Discogenic syndrome, lumbar (WC injury); Osteoarthritis of spine with radiculopathy, lumbar region (WC injury); Lumbar facet syndrome (WC injury) (Bilateral) (R>L); Lumbosacral radiculopathy (WC injury); Pain in right knee (WC injury); Obesity, Class III, BMI 40-49.9 (morbid obesity) ([place]); Uncomplicated opioid dependence ([place]); Therapeutic opioid-induced constipation (OIC); Dyslipidemia; Microalbuminuria; Adiposity; Lumbar canal stenosis; Type 2 diabetes mellitus ([place]); Long term prescription opiate use; Encounter for chronic pain management; Neurogenic pain; Neuropathic pain; Musculoskeletal pain; Lumbar spondylosis; Chronic knee pain (Location of Secondary source of pain) (Right); Osteoarthritis of knee (Location of Secondary source of pain) (Right); Chronic shoulder pain (Location of Tertiary source of pain) (Right); 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 His primarily concern today is the Shoulder Pain (right); Back Pain (lower); and Knee Pain (bilaterally)  Pain Assessment: Location: Right Shoulder Radiating: stays in the shoulder Onset: More than a month ago Duration: Chronic pain Quality: Aching, Throbbing, Constant Severity: 4 /10 (self-reported pain score)  Note: Clinically the patient looks like a 2/10 Reported level is inconsistent with clinical observations. Information on the proper use of the pain scale provided to the patient today Effect on ADL: pace self Timing: Constant Modifying factors: medicine  procedure  [name] comes in today for post-procedure evaluation after the treatment done on 06/22/2016. Although the patient did get approximately 50% benefit from the procedure, he did not get any benefit from the local anesthetic and in fact he indicated an increase in the pain after the procedure.  Further details on both, my assessment(s), as well as the proposed treatment plan, please see below.  Post-Procedure Assessment  06/22/2016 Procedure: Diagnostic Right Suprascapular nerve Block, under fluoroscopy, no sedation Pre-procedure pain score:  4/10 Post-procedure pain score: 5/10 No relief Influential Factors: BMI: 39.54 kg/m Intra-procedural challenges: None observed Assessment challenges: None detected         Post-procedural adverse reactions or complications: None reported Reported side-effects: Transient post-op increase in pain  Sedation: No sedation used. When no sedatives are used, the analgesic levels obtained are directly associated to the effectiveness of the local anesthetics. However, when sedation is provided, the level of analgesia obtained during the initial 1 hour following the intervention, is believed to be the result  of a combination of factors. These factors may include, but are not limited to: 1. The effectiveness of the local anesthetics used. 2. The effects of the analgesic(s) and/or anxiolytic(s) used.  3. The degree of discomfort experienced by the patient at the time of the procedure. 4. The patients ability and reliability in recalling and recording the events. 5. The presence and influence of possible secondary gains and/or psychosocial factors. Reported result: Relief experienced during the 1st hour after the procedure: 0 % (states pain elevated and hurt more for three days- no numbness per pt) (Ultra-Short Term Relief) Interpretative annotation: No relief from the local anesthetics would suggest pain etiology to reside elsewhere.          Effects of local anesthetic: The analgesic effects attained during this period are directly associated to the localized infiltration of local anesthetics and therefore cary significant diagnostic value as to the etiological location, or anatomical origin, of the pain. Expected duration of relief is directly dependent on the pharmacodynamics of the local anesthetic used. Long-acting (4-6 hours) anesthetics used.  Reported result: Relief during the next 4 to 6 hour after the procedure: 0 % (Short-Term Relief) Interpretative annotation: No analgesic effect would suggest pain etiology to reside elsewhere.          Long-term benefit: Defined as the period of time past the expected duration of local anesthetics. With the possible exception of prolonged sympathetic blockade from the local anesthetics, benefits during this period are typically attributed to, or associated with, other factors such as analgesic sensory neuropraxia, antiinflammatory effects, or beneficial biochemical changes provided by agents other than the local anesthetics Reported result: Extended relief following procedure: 50 % (able to sleet- not throbbing ) (Long-Term Relief) Interpretative annotation:  Partial relief. This could suggest the algesic mechanism to be a combination of tissue inflammation and mechanical problems.          Current benefits: Defined as persistent relief that continues at this point in time.   Reported results: Treated area: 50 %       Interpretative annotation: Long-term benefit would suggest adequate anti-inflammatory effects  Interpretation: Results would suggest a successful diagnostic intervention.          Laboratory Chemistry  Inflammation Markers Lab Results  Component Value Date   CRP 0.6 03/12/2015   ESRSEDRATE 9 03/12/2015   (CRP: Acute Phase) (ESR: Chronic Phase) Renal Function Markers Lab Results  Component Value Date   BUN 15 03/12/2015   CREATININE 0.82 03/12/2015   GFRAA >60 03/12/2015   GFRNONAA >60 03/12/2015   Hepatic Function Markers Lab Results  Component Value Date   AST 41 03/12/2015   ALT 55 03/12/2015   ALBUMIN 4.2 03/12/2015   ALKPHOS 89 03/12/2015   Electrolytes Lab Results  Component Value Date   NA 140 03/12/2015   K 4.2 03/12/2015   CL 105 03/12/2015   CALCIUM 10.0 03/12/2015   MG 1.8 03/12/2015   Neuropathy Markers No results found for: [id number] Bone Pathology Markers Lab Results  Component Value Date   ALKPHOS 89 03/12/2015   CALCIUM 10.0 03/12/2015   Coagulation Parameters Lab Results  Component Value Date   PLT 176 03/13/2015   Cardiovascular Markers Lab Results  Component Value Date   HGB 15.3 03/13/2015   HCT 46.4 03/13/2015   Note: Lab results reviewed.  Recent Diagnostic Imaging Review  Dg C-arm 1-60 Min-no Report  Result Date: 06/22/2016 Fluoroscopy was utilized by the requesting physician.  No radiographic interpretation.   Note: Imaging results reviewed.          Meds  Current Outpatient Prescriptions (Endocrine & Metabolic):  .  insulin degludec (TRESIBA FLEXTOUCH) 100 UNIT/ML SOPN FlexTouch  Pen, Inject 40 Units into the skin daily.  .  insulin degludec (TRESIBA FLEXTOUCH)  100 UNIT/ML SOPN FlexTouch Pen, Inject into the skin. Marland Kitchen  liraglutide (VICTOZA) 18 MG/3ML SOPN, INJECT 0.3 MLS (1.8 MG TOTAL) SUBCUTANEOUSLY ONCE DAILY. .  metFORMIN (GLUCOPHAGE) 1000 MG tablet, TAKE 1 TABLET (1,000 MG TOTAL) BY MOUTH 2 (TWO) TIMES DAILY WITH MEALS. Marland Kitchen  NOVOLOG FLEXPEN 100 UNIT/ML FlexPen, Inject 0-10 Units into the skin daily as needed for high blood sugar (sliding scale). Reported on 02/24/2015  Current Outpatient Prescriptions (Cardiovascular):  .  ramipril (ALTACE) 5 MG capsule, Take 5 mg by mouth every morning.  .  rosuvastatin (CRESTOR) 5 MG tablet, Take 5 mg by mouth 3 (three) times a week. Monday Wednesday Friday  Current Outpatient Prescriptions (Analgesics):  .  [START ON 08/10/2016] oxyCODONE (OXY IR/ROXICODONE) 5 MG immediate release tablet, Take 1 tablet (5 mg total) by mouth every 6 (six) hours as needed for severe pain. Marland Kitchen  oxyCODONE (OXY IR/ROXICODONE) 5 MG immediate release tablet, Take 1 tablet (5 mg total) by mouth every 6 (six) hours as needed for severe pain. Marland Kitchen  oxyCODONE (OXY IR/ROXICODONE) 5 MG immediate release tablet, Take 1 tablet (5 mg total) by mouth every 6 (six) hours as needed for severe pain.  Current Outpatient Prescriptions (Hematological):  .  cyanocobalamin (,VITAMIN B-12,) 1000 MCG/ML injection, Inject into the muscle every 30 (thirty) days.   Current Outpatient Prescriptions (Other):  .  gabapentin (NEURONTIN) 600 MG tablet, Take 1 tablet (600 mg total) by mouth every 6 (six) hours. .  potassium citrate (UROCIT-K) 10 MEQ (1080 MG) SR tablet, Take 10 mEq by mouth 2 (two) times daily. .  Potassium Citrate 15 MEQ (1620 MG) TBCR, TAKE 1 TABLET BY MOUTH IN THE MORNING TAKE 1 TABLET BY MOUTH IN THE EVENING  ROS  Constitutional: Denies any fever or chills Gastrointestinal: No reported hemesis, hematochezia, vomiting, or acute GI distress Musculoskeletal: Denies any acute onset joint swelling, redness, loss of ROM, or weakness Neurological: No  reported episodes of acute onset apraxia, aphasia, dysarthria, agnosia, amnesia, paralysis, loss of coordination, or loss of consciousness  Allergies  Mr. Ryans has No Known Allergies.  [place]  Drug: Mr. Rands  reports that he does not use drugs. Alcohol:  reports that he drinks alcohol. Tobacco:  reports that he has never smoked. He has never used smokeless tobacco. Medical:  has a past medical history of Arthritis, senescent (10/22/2014); Back pain; Chronic pain syndrome (10/22/2014); Diabetes mellitus without complication ([place]); History of kidney stones; IBS (irritable bowel syndrome); Lateral meniscus tear; Preventative health care; Sleep apnea; and Spinal stenosis. Surgical: Mr. Zenz  has a past surgical history that includes Tonsillectomy; Shoulder surgery; Palate surgery; Cervical fusion; Foot surgery; Lumbar laminectomy/decompression microdiscectomy (N/A, 02/06/2013); Knee arthroscopy (Right, 10/02/2013); Knee arthroscopy with medial menisectomy (Right, 03/25/2015); Knee bursectomy (Right, 03/25/2015); and Cystoscopy w/ retrogrades (Right, 11/19/2015). Family: family history includes COPD in his mother; Cancer in his mother; Diabetes in his father; Heart disease in his father and mother.  Constitutional Exam  General appearance: Well nourished, well developed, and well hydrated. In no apparent acute distress Vitals:   07/12/16 0817  BP: (!) 151/87  Pulse: 89  Resp: 16  Temp: 98.2 F (36.8 C)  SpO2: 99%  Weight: 245 lb (111.1 kg)  Height: 5' 6" (1.676 m)   BMI Assessment: Estimated body mass index is 39.54 kg/m as calculated from the following:   Height as of this encounter: 5' 6" (1.676 m).  Weight as of this encounter: 245 lb (111.1 kg).  BMI interpretation table: BMI level Category Range association with higher incidence of chronic pain  <18 kg/m2 Underweight   18.5-24.9 kg/m2 Ideal body weight   25-29.9 kg/m2 Overweight Increased incidence by 20%  30-34.9 kg/m2 Obese  (Class I) Increased incidence by 68%  35-39.9 kg/m2 Severe obesity (Class II) Increased incidence by 136%  >40 kg/m2 Extreme obesity (Class III) Increased incidence by 254%   BMI Readings from Last 4 Encounters:  07/12/16 39.54 kg/m  06/22/16 39.54 kg/m  05/05/16 39.54 kg/m  02/10/16 38.16 kg/m   Wt Readings from Last 4 Encounters:  07/12/16 245 lb (111.1 kg)  06/22/16 245 lb (111.1 kg)  05/05/16 245 lb (111.1 kg)  02/10/16 240 lb (108.9 kg)  Psych/Mental status: Alert, oriented x 3 (person, place, & time)       Eyes: PERLA Respiratory: No evidence of acute respiratory distress  Cervical Spine Exam  Inspection: No masses, redness, or swelling Alignment: Symmetrical Functional ROM: Unrestricted ROM      Stability: No instability detected Muscle strength & Tone: Functionally intact Sensory: Unimpaired Palpation: No palpable anomalies              Upper Extremity (UE) Exam    Side: Right upper extremity  Side: Left upper extremity  Inspection: No masses, redness, swelling, or asymmetry. No contractures  Inspection: No masses, redness, swelling, or asymmetry. No contractures  Functional ROM: Improved after treatment for shoulder  Functional ROM: Unrestricted ROM          Muscle strength & Tone: Functionally intact  Muscle strength & Tone: Functionally intact  Sensory: Unimpaired  Sensory: Unimpaired  Palpation: No palpable anomalies              Palpation: No palpable anomalies              Specialized Test(s): Deferred         Specialized Test(s): Deferred          Thoracic Spine Exam  Inspection: No masses, redness, or swelling Alignment: Symmetrical Functional ROM: Unrestricted ROM Stability: No instability detected Sensory: Unimpaired Muscle strength & Tone: No palpable anomalies  Lumbar Spine Exam  Inspection: No masses, redness, or swelling Alignment: Symmetrical Functional ROM: Unrestricted ROM      Stability: No instability detected Muscle strength & Tone:  Functionally intact Sensory: Unimpaired Palpation: No palpable anomalies       Provocative Tests: Lumbar Hyperextension and rotation test: evaluation deferred today       Patrick's Maneuver: evaluation deferred today                    Gait & Posture Assessment  Ambulation: Unassisted Gait: Relatively normal for age and body habitus Posture: WNL   Lower Extremity Exam    Side: Right lower extremity  Side: Left lower extremity  Inspection: No masses, redness, swelling, or asymmetry. No contractures  Inspection: No masses, redness, swelling, or asymmetry. No contractures  Functional ROM: Unrestricted ROM          Functional ROM: Unrestricted ROM          Muscle strength & Tone: Functionally intact  Muscle strength & Tone: Functionally intact  Sensory: Unimpaired  Sensory: Unimpaired  Palpation: No palpable anomalies  Palpation: No palpable anomalies   Assessment  Primary Diagnosis & Pertinent Problem List: The primary encounter diagnosis was Chronic low back pain (WC injury) (Location of Primary Source of Pain) (Bilateral) (R>L). Diagnoses of  Chronic knee pain (Location of Secondary source of pain) (Right), Chronic shoulder pain (Location of Tertiary source of pain) (Right), Chronic pain syndrome (WC injury), Long term prescription opiate use, and Opiate use (30 MME/Day) were also pertinent to this visit.  Status Diagnosis  Controlled Controlled Improved 1. Chronic low back pain (WC injury) (Location of Primary Source of Pain) (Bilateral) (R>L)   2. Chronic knee pain (Location of Secondary source of pain) (Right)   3. Chronic shoulder pain (Location of Tertiary source of pain) (Right)   4. Chronic pain syndrome (WC injury)   5. Long term prescription opiate use   6. Opiate use (30 MME/Day)     Problems updated and reviewed during this visit: No problems updated. Plan of Care  Pharmacotherapy (Medications Ordered): No orders of the defined types were placed in this  encounter.  New Prescriptions   No medications on file   Medications administered today: Mr. Hechler had no medications administered during this visit.  Lab-work, procedure(s), and/or referral(s): Orders Placed This Encounter  Procedures  . Comprehensive metabolic panel  . C-reactive protein  . Sedimentation rate  . Magnesium  . 25-Hydroxyvitamin D Lcms D2+D3  . Vitamin B12  . ToxASSURE Select 13 (MW), Urine    Interventional therapies: Planned, scheduled, and/or pending:   Not at this time.   Considering:   Palliative bilateral lumbar facet block  Palliative bilateral lumbar facet radiofrequency ablation (Right side done 04/13/2016) (100% relief) Diagnostic right knee genicular nerve block .  Possible right knee genicular nerve radiofrequency ablation   Palliative PRN treatment(s):   Diagnostic Right Suprascapular nerve Block #2 Possible Right Suprascapular nerve RFA    Provider-requested follow-up: Return for Med-Mgmt, by NP (before 09/09/2016), in addition, PRN procedure(s), by MD.  Future Appointments Date Time Provider [place]  08/25/2016 8:00 AM Vevelyn Francois, NP [facility] None   Primary Care Physician: Patient, No Pcp Per Location: [facility] Note by: Kathlen Brunswick. Dossie Arbour, M.D, DABA, DABAPM, DABPM, DABIPP, FIPP Date: 07/12/2016; Time: 8:36 AM  Patient Instructions    ____________________________________________________________________________________________  Appointment Policy Summary  It is our goal and responsibility to provide the medical community with assistance in the evaluation and management of patients with chronic pain. Unfortunately our resources are limited. Because we do not have an unlimited amount of time, or available appointments, we are required to closely monitor and manage their use. The following rules exist to maximize their use:  Patient's responsibilities: 1. Punctuality: You are required to  be physically present and registered in our facility at least 30 minutes before your appointment. 2. Tardiness: The cutoff is your appointment time. If you have an appointment scheduled for 10:00 AM and you arrive at 10:01, you will be required to reschedule your appointment.  3. Plan ahead: Always assume that you will encounter traffic on your way in. Plan for it. If you are dependent on a driver, make sure they understand these rules and the need to arrive early. 4. Other appointments and responsibilities: Avoid scheduling any other appointments before or after your pain clinic appointments.  5. Be prepared: Write down everything that you need to discuss with your healthcare provider and give this information to the admitting nurse. Write down the medications that you will need refilled. Bring your pills and bottles (even the empty ones), to all of your appointments, except for those where a procedure is scheduled. 6. No children or pets: Find someone to take care of them. It is not  appropriate to bring them in. 7. Scheduling changes: We request "advanced notification" of any changes or cancellations. 8. Advanced notification: Defined as a time period of more than 24 hours prior to the originally scheduled appointment. This allows for the appointment to be offered to other patients. 9. Rescheduling: When a visit is rescheduled, it will require the cancellation of the original appointment. For this reason they both fall within the category of "Cancellations".  10. Cancellations: They require advanced notification. Any cancellation less than 24 hours before the  appointment will be recorded as a "No Show". 11. No Show: Defined as an unkept appointment where the patient failed to notify or declare to the practice their intention or inability to keep the appointment.  Corrective process for repeat offenders:  1. Tardiness: Three (3) episodes of rescheduling due to late arrivals will be recorded as one (1)  "No Show". 2. Cancellation or reschedule: Three (3) cancellations or rescheduling will be recorded as one (1) "No Show". 3. "No Shows": Three (3) "No Shows" within a 12 month period will result in discharge from the practice.  ____________________________________________________________________________________________  ____________________________________________________________________________________________  Pain Scale  Introduction: The pain score used by this practice is the Verbal Numerical Rating Scale (VNRS-11). This is an 11-point scale. It is for adults and children 10 years or older. There are significant differences in how the pain score is reported, used, and applied. Forget everything you learned in the past and learn this scoring system.  General Information: The scale should reflect your current level of pain. Unless you are specifically asked for the level of your worst pain, or your average pain. If you are asked for one of these two, then it should be understood that it is over the past 24 hours.  Basic Activities of Daily Living (ADL): Personal hygiene, dressing, eating, transferring, and using restroom.  Instructions: Most patients tend to report their level of pain as a combination of two factors, their physical pain and their psychosocial pain. This last one is also known as "suffering" and it is reflection of how physical pain affects you socially and psychologically. From now on, report them separately. From this point on, when asked to report your pain level, report only your physical pain. Use the following table for reference.  Pain Clinic Pain Levels (0-5/10)  Pain Level Score  Description  No Pain 0   Mild pain 1 Nagging, annoying, but does not interfere with basic activities of daily living (ADL). Patients are able to eat, bathe, get dressed, toileting (being able to get on and off the toilet and perform personal hygiene functions), transfer (move in and out of bed  or a chair without assistance), and maintain continence (able to control bladder and bowel functions). Blood pressure and heart rate are unaffected. A normal heart rate for a healthy adult ranges from 60 to 100 bpm (beats per minute).   Mild to moderate pain 2 Noticeable and distracting. Impossible to hide from other people. More frequent flare-ups. Still possible to adapt and function close to normal. It can be very annoying and may have occasional stronger flare-ups. With discipline, patients may get used to it and adapt.   Moderate pain 3 Interferes significantly with activities of daily living (ADL). It becomes difficult to feed, bathe, get dressed, get on and off the toilet or to perform personal hygiene functions. Difficult to get in and out of bed or a chair without assistance. Very distracting. With effort, it can be ignored when deeply involved  in activities.   Moderately severe pain 4 Impossible to ignore for more than a few minutes. With effort, patients may still be able to manage work or participate in some social activities. Very difficult to concentrate. Signs of autonomic nervous system discharge are evident: dilated pupils (mydriasis); mild sweating (diaphoresis); sleep interference. Heart rate becomes elevated (>115 bpm). Diastolic blood pressure (lower number) rises above 100 mmHg. Patients find relief in laying down and not moving.   Severe pain 5 Intense and extremely unpleasant. Associated with frowning face and frequent crying. Pain overwhelms the senses.  Ability to do any activity or maintain social relationships becomes significantly limited. Conversation becomes difficult. Pacing back and forth is common, as getting into a comfortable position is nearly impossible. Pain wakes you up from deep sleep. Physical signs will be obvious: pupillary dilation; increased sweating; goosebumps; brisk reflexes; cold, clammy hands and feet; nausea, vomiting or dry heaves; loss of appetite;  significant sleep disturbance with inability to fall asleep or to remain asleep. When persistent, significant weight loss is observed due to the complete loss of appetite and sleep deprivation.  Blood pressure and heart rate becomes significantly elevated. Caution: If elevated blood pressure triggers a pounding headache associated with blurred vision, then the patient should immediately seek attention at an urgent or emergency care unit, as these may be signs of an impending stroke.    Emergency Department Pain Levels (6-10/10)  Emergency Room Pain 6 Severely limiting. Requires emergency care and should not be seen or managed at an outpatient pain management facility. Communication becomes difficult and requires great effort. Assistance to reach the emergency department may be required. Facial flushing and profuse sweating along with potentially dangerous increases in heart rate and blood pressure will be evident.   Distressing pain 7 Self-care is very difficult. Assistance is required to transport, or use restroom. Assistance to reach the emergency department will be required. Tasks requiring coordination, such as bathing and getting dressed become very difficult.   Disabling pain 8 Self-care is no longer possible. At this level, pain is disabling. The individual is unable to do even the most "basic" activities such as walking, eating, bathing, dressing, transferring to a bed, or toileting. Fine motor skills are lost. It is difficult to think clearly.   Incapacitating pain 9 Pain becomes incapacitating. Thought processing is no longer possible. Difficult to remember your own name. Control of movement and coordination are lost.   The worst pain imaginable 10 At this level, most patients pass out from pain. When this level is reached, collapse of the autonomic nervous system occurs, leading to a sudden drop in blood pressure and heart rate. This in turn results in a temporary and dramatic drop in blood  flow to the brain, leading to a loss of consciousness. Fainting is one of the body's self defense mechanisms. Passing out puts the brain in a calmed state and causes it to shut down for a while, in order to begin the healing process.    Summary: 1. Refer to this scale when providing [place] with your pain level. 2. Be accurate and careful when reporting your pain level. This will help with your care. 3. Over-reporting your pain level will lead to loss of credibility. 4. Even a level of 1/10 means that there is pain and will be treated at our facility. 5. High, inaccurate reporting will be documented as "Symptom Exaggeration", leading to loss of credibility and suspicions of possible secondary gains such as obtaining more narcotics, or  wanting to appear disabled, for fraudulent reasons. 6. Only pain levels of 5 or below will be seen at our facility. 7. Pain levels of 6 and above will be sent to the Emergency Department and the appointment cancelled. ____________________________________________________________________________________________   ____________________________________________________________________________________________  Medication Rules  Applies to: All patients receiving prescriptions (written or electronic).  Pharmacy of record: Pharmacy where electronic prescriptions will be sent. If written prescriptions are taken to a different pharmacy, please inform the nursing staff. The pharmacy listed in the electronic medical record should be the one where you would like electronic prescriptions to be sent.  Prescription refills: Only during scheduled appointments. Applies to both, written and electronic prescriptions.  NOTE: The following applies primarily to controlled substances (Opioid Pain Medications)  Patient's responsibilities: 1. Pain Pills: Bring all pain pills to every appointment (except for procedure appointments). 2. Pill Bottles: Bring pills in original pharmacy bottle.  Always bring newest bottle. Bring bottle, even if empty. 3. Medication refills: You are responsible for knowing and keeping track of what medications you need refilled. The day before your appointment, write a list of all prescriptions that need to be refilled. Bring that list to your appointment and give it to the admitting nurse. Prescriptions will be written only during appointments. If you forget a medication, it will not be "Called in", "Faxed", or "electronically sent". You will need to get another appointment to get these prescribed. 4. Prescription Accuracy: You are responsible for carefully inspecting your prescriptions before leaving our office. Have the discharge nurse carefully go over each prescription with you, before taking them home. Make sure that your name is accurately spelled, that your address is correct. Check the name and dose of your medication to make sure it is accurate. Check the number of pills, and the written instructions to make sure they are clear and accurate. Make sure that you are given enough medication to last until your next medication refill appointment. 5. Taking Medication: Take medication as prescribed. Never take more pills than instructed. Never take medication more frequently than prescribed. Taking less pills or less frequently is permitted and encouraged, when it comes to controlled substances (written prescriptions).  6. Inform other Doctors: Always inform, all of your healthcare providers, of all the medications you take. 7. Pain Medication from other Providers: You are not allowed to accept any additional pain medication from any other Doctor or Healthcare provider. There are two exceptions to this rule. (see below) In the event that you require additional pain medication, you are responsible for notifying us, as stated below. 8. Medication Agreement: You are responsible for carefully reading and following our Medication Agreement. This must be signed before  receiving any prescriptions from our practice. Safely store a copy of your signed Agreement. Violations to the Agreement will result in no further prescriptions. (Additional copies of our Medication Agreement are available upon request.) 9. Laws, Rules, & Regulations: All patients are expected to follow all Federal and [employer], [employer], Rules, [facility] [facility]. Ignorance of the Laws does not constitute a valid excuse.  Exceptions: There are only two exceptions to the rule of not receiving pain medications from other Healthcare Providers. 1. Exception #1 (Emergencies): In the event of an emergency (i.e.: accident requiring emergency care), you are allowed to receive additional pain medication. However, you are responsible for: As soon as you are able, call our office [phone], at any time of the day or night, and leave a message stating your name, the date and nature  of the emergency, and the name and dose of the medication prescribed. In the event that your call is answered by a member of our staff, make sure to document and save the date, time, and the name of the person that took your information.  2. Exception #2 (Planned Surgery): In the event that you are scheduled by another doctor or dentist to have any type of surgery or procedure, you are allowed (for a period no longer than 30 days), to receive additional pain medication, for the acute post-op pain. However, in this case, you are responsible for picking up a copy of our "Post-op Pain Management for Surgeons" handout, and giving it to your surgeon or dentist. This document is available at our office, and does not require an appointment to obtain it. Simply go to our office during business hours (Monday-Thursday from 8:00 AM to 4:00 PM) (Friday 8:00 AM to 12:00 Noon) or if you have a scheduled appointment with [place], prior to your surgery, and ask for it by name. In addition, you will need to provide [place] with your name, name of your surgeon, type of  surgery, and date of procedure or surgery. 3. CALL us IF YOU NEED [place] FOR ANY PROCEDURES  ____________________________________________________________________________________________

## 2016-07-12 ENCOUNTER — Ambulatory Visit: Payer: BLUE CROSS/BLUE SHIELD | Attending: Pain Medicine | Admitting: Pain Medicine

## 2016-07-12 ENCOUNTER — Encounter: Payer: Self-pay | Admitting: Pain Medicine

## 2016-07-12 VITALS — BP 151/87 | HR 89 | Temp 98.2°F | Resp 16 | Ht 66.0 in | Wt 245.0 lb

## 2016-07-12 DIAGNOSIS — M19011 Primary osteoarthritis, right shoulder: Secondary | ICD-10-CM | POA: Diagnosis not present

## 2016-07-12 DIAGNOSIS — M4726 Other spondylosis with radiculopathy, lumbar region: Secondary | ICD-10-CM | POA: Diagnosis not present

## 2016-07-12 DIAGNOSIS — G8929 Other chronic pain: Secondary | ICD-10-CM | POA: Diagnosis not present

## 2016-07-12 DIAGNOSIS — F1190 Opioid use, unspecified, uncomplicated: Secondary | ICD-10-CM

## 2016-07-12 DIAGNOSIS — Z87442 Personal history of urinary calculi: Secondary | ICD-10-CM | POA: Diagnosis not present

## 2016-07-12 DIAGNOSIS — M48061 Spinal stenosis, lumbar region without neurogenic claudication: Secondary | ICD-10-CM | POA: Diagnosis not present

## 2016-07-12 DIAGNOSIS — M1711 Unilateral primary osteoarthritis, right knee: Secondary | ICD-10-CM | POA: Insufficient documentation

## 2016-07-12 DIAGNOSIS — K5903 Drug induced constipation: Secondary | ICD-10-CM | POA: Diagnosis not present

## 2016-07-12 DIAGNOSIS — Z6841 Body Mass Index (BMI) 40.0 and over, adult: Secondary | ICD-10-CM | POA: Diagnosis not present

## 2016-07-12 DIAGNOSIS — M25561 Pain in right knee: Secondary | ICD-10-CM | POA: Diagnosis not present

## 2016-07-12 DIAGNOSIS — M25511 Pain in right shoulder: Secondary | ICD-10-CM | POA: Diagnosis present

## 2016-07-12 DIAGNOSIS — G894 Chronic pain syndrome: Secondary | ICD-10-CM

## 2016-07-12 DIAGNOSIS — E785 Hyperlipidemia, unspecified: Secondary | ICD-10-CM | POA: Insufficient documentation

## 2016-07-12 DIAGNOSIS — Z79891 Long term (current) use of opiate analgesic: Secondary | ICD-10-CM | POA: Diagnosis not present

## 2016-07-12 DIAGNOSIS — M545 Low back pain: Secondary | ICD-10-CM

## 2016-07-12 NOTE — Progress Notes (Signed)
Safety precautions to be maintained throughout the outpatient stay will include: orient to surroundings, keep bed in low position, maintain call bell within reach at all times, provide assistance with transfer out of bed and ambulation.  

## 2016-07-12 NOTE — Patient Instructions (Addendum)
____________________________________________________________________________________________  Appointment Policy Summary  It is our goal and responsibility to provide the medical community with assistance in the evaluation and management of patients with chronic pain. Unfortunately our resources are limited. Because we do not have an unlimited amount of time, or available appointments, we are required to closely monitor and manage their use. The following rules exist to maximize their use:  Patient's responsibilities: 1. Punctuality: You are required to be physically present and registered in our facility at least 30 minutes before your appointment. 2. Tardiness: The cutoff is your appointment time. If you have an appointment scheduled for 10:00 AM and you arrive at 10:01, you will be required to reschedule your appointment.  3. Plan ahead: Always assume that you will encounter traffic on your way in. Plan for it. If you are dependent on a driver, make sure they understand these rules and the need to arrive early. 4. Other appointments and responsibilities: Avoid scheduling any other appointments before or after your pain clinic appointments.  5. Be prepared: Write down everything that you need to discuss with your healthcare provider and give this information to the admitting nurse. Write down the medications that you will need refilled. Bring your pills and bottles (even the empty ones), to all of your appointments, except for those where a procedure is scheduled. 6. No children or pets: Find someone to take care of them. It is not appropriate to bring them in. 7. Scheduling changes: We request "advanced notification" of any changes or cancellations. 8. Advanced notification: Defined as a time period of more than 24 hours prior to the originally scheduled appointment. This allows for the appointment to be offered to other patients. 9. Rescheduling: When a visit is rescheduled, it will require the  cancellation of the original appointment. For this reason they both fall within the category of "Cancellations".  10. Cancellations: They require advanced notification. Any cancellation less than 24 hours before the  appointment will be recorded as a "No Show". 11. No Show: Defined as an unkept appointment where the patient failed to notify or declare to the practice their intention or inability to keep the appointment.  Corrective process for repeat offenders:  1. Tardiness: Three (3) episodes of rescheduling due to late arrivals will be recorded as one (1) "No Show". 2. Cancellation or reschedule: Three (3) cancellations or rescheduling will be recorded as one (1) "No Show". 3. "No Shows": Three (3) "No Shows" within a 12 month period will result in discharge from the practice.  ____________________________________________________________________________________________  ____________________________________________________________________________________________  Pain Scale  Introduction: The pain score used by this practice is the Verbal Numerical Rating Scale (VNRS-11). This is an 11-point scale. It is for adults and children 10 years or older. There are significant differences in how the pain score is reported, used, and applied. Forget everything you learned in the past and learn this scoring system.  General Information: The scale should reflect your current level of pain. Unless you are specifically asked for the level of your worst pain, or your average pain. If you are asked for one of these two, then it should be understood that it is over the past 24 hours.  Basic Activities of Daily Living (ADL): Personal hygiene, dressing, eating, transferring, and using restroom.  Instructions: Most patients tend to report their level of pain as a combination of two factors, their physical pain and their psychosocial pain. This last one is also known as "suffering" and it is reflection of how  physical   pain affects you socially and psychologically. From now on, report them separately. From this point on, when asked to report your pain level, report only your physical pain. Use the following table for reference.  Pain Clinic Pain Levels (0-5/10)  Pain Level Score  Description  No Pain 0   Mild pain 1 Nagging, annoying, but does not interfere with basic activities of daily living (ADL). Patients are able to eat, bathe, get dressed, toileting (being able to get on and off the toilet and perform personal hygiene functions), transfer (move in and out of bed or a chair without assistance), and maintain continence (able to control bladder and bowel functions). Blood pressure and heart rate are unaffected. A normal heart rate for a healthy adult ranges from 60 to 100 bpm (beats per minute).   Mild to moderate pain 2 Noticeable and distracting. Impossible to hide from other people. More frequent flare-ups. Still possible to adapt and function close to normal. It can be very annoying and may have occasional stronger flare-ups. With discipline, patients may get used to it and adapt.   Moderate pain 3 Interferes significantly with activities of daily living (ADL). It becomes difficult to feed, bathe, get dressed, get on and off the toilet or to perform personal hygiene functions. Difficult to get in and out of bed or a chair without assistance. Very distracting. With effort, it can be ignored when deeply involved in activities.   Moderately severe pain 4 Impossible to ignore for more than a few minutes. With effort, patients may still be able to manage work or participate in some social activities. Very difficult to concentrate. Signs of autonomic nervous system discharge are evident: dilated pupils (mydriasis); mild sweating (diaphoresis); sleep interference. Heart rate becomes elevated (>115 bpm). Diastolic blood pressure (lower number) rises above 100 mmHg. Patients find relief in laying down and not  moving.   Severe pain 5 Intense and extremely unpleasant. Associated with frowning face and frequent crying. Pain overwhelms the senses.  Ability to do any activity or maintain social relationships becomes significantly limited. Conversation becomes difficult. Pacing back and forth is common, as getting into a comfortable position is nearly impossible. Pain wakes you up from deep sleep. Physical signs will be obvious: pupillary dilation; increased sweating; goosebumps; brisk reflexes; cold, clammy hands and feet; nausea, vomiting or dry heaves; loss of appetite; significant sleep disturbance with inability to fall asleep or to remain asleep. When persistent, significant weight loss is observed due to the complete loss of appetite and sleep deprivation.  Blood pressure and heart rate becomes significantly elevated. Caution: If elevated blood pressure triggers a pounding headache associated with blurred vision, then the patient should immediately seek attention at an urgent or emergency care unit, as these may be signs of an impending stroke.    Emergency Department Pain Levels (6-10/10)  Emergency Room Pain 6 Severely limiting. Requires emergency care and should not be seen or managed at an outpatient pain management facility. Communication becomes difficult and requires great effort. Assistance to reach the emergency department may be required. Facial flushing and profuse sweating along with potentially dangerous increases in heart rate and blood pressure will be evident.   Distressing pain 7 Self-care is very difficult. Assistance is required to transport, or use restroom. Assistance to reach the emergency department will be required. Tasks requiring coordination, such as bathing and getting dressed become very difficult.   Disabling pain 8 Self-care is no longer possible. At this level, pain is disabling. The individual   is unable to do even the most "basic" activities such as walking, eating, bathing,  dressing, transferring to a bed, or toileting. Fine motor skills are lost. It is difficult to think clearly.   Incapacitating pain 9 Pain becomes incapacitating. Thought processing is no longer possible. Difficult to remember your own name. Control of movement and coordination are lost.   The worst pain imaginable 10 At this level, most patients pass out from pain. When this level is reached, collapse of the autonomic nervous system occurs, leading to a sudden drop in blood pressure and heart rate. This in turn results in a temporary and dramatic drop in blood flow to the brain, leading to a loss of consciousness. Fainting is one of the body's self defense mechanisms. Passing out puts the brain in a calmed state and causes it to shut down for a while, in order to begin the healing process.    Summary: 1. Refer to this scale when providing [place] with your pain level. 2. Be accurate and careful when reporting your pain level. This will help with your care. 3. Over-reporting your pain level will lead to loss of credibility. 4. Even a level of 1/10 means that there is pain and will be treated at our facility. 5. High, inaccurate reporting will be documented as "Symptom Exaggeration", leading to loss of credibility and suspicions of possible secondary gains such as obtaining more narcotics, or wanting to appear disabled, for fraudulent reasons. 6. Only pain levels of 5 or below will be seen at our facility. 7. Pain levels of 6 and above will be sent to the Emergency Department and the appointment cancelled. ____________________________________________________________________________________________   ____________________________________________________________________________________________  Medication Rules  Applies to: All patients receiving prescriptions (written or electronic).  Pharmacy of record: Pharmacy where electronic prescriptions will be sent. If written prescriptions are taken to a  different pharmacy, please inform the nursing staff. The pharmacy listed in the electronic medical record should be the one where you would like electronic prescriptions to be sent.  Prescription refills: Only during scheduled appointments. Applies to both, written and electronic prescriptions.  NOTE: The following applies primarily to controlled substances (Opioid Pain Medications)  Patient's responsibilities: 1. Pain Pills: Bring all pain pills to every appointment (except for procedure appointments). 2. Pill Bottles: Bring pills in original pharmacy bottle. Always bring newest bottle. Bring bottle, even if empty. 3. Medication refills: You are responsible for knowing and keeping track of what medications you need refilled. The day before your appointment, write a list of all prescriptions that need to be refilled. Bring that list to your appointment and give it to the admitting nurse. Prescriptions will be written only during appointments. If you forget a medication, it will not be "Called in", "Faxed", or "electronically sent". You will need to get another appointment to get these prescribed. 4. Prescription Accuracy: You are responsible for carefully inspecting your prescriptions before leaving our office. Have the discharge nurse carefully go over each prescription with you, before taking them home. Make sure that your name is accurately spelled, that your address is correct. Check the name and dose of your medication to make sure it is accurate. Check the number of pills, and the written instructions to make sure they are clear and accurate. Make sure that you are given enough medication to last until your next medication refill appointment. 5. Taking Medication: Take medication as prescribed. Never take more pills than instructed. Never take medication more frequently than prescribed. Taking less pills or  less frequently is permitted and encouraged, when it comes to controlled substances (written  prescriptions).  6. Inform other Doctors: Always inform, all of your healthcare providers, of all the medications you take. 7. Pain Medication from other Providers: You are not allowed to accept any additional pain medication from any other Doctor or Healthcare provider. There are two exceptions to this rule. (see below) In the event that you require additional pain medication, you are responsible for notifying us, as stated below. 8. Medication Agreement: You are responsible for carefully reading and following our Medication Agreement. This must be signed before receiving any prescriptions from our practice. Safely store a copy of your signed Agreement. Violations to the Agreement will result in no further prescriptions. (Additional copies of our Medication Agreement are available upon request.) 9. Laws, Rules, & Regulations: All patients are expected to follow all [address] and [employer] Laws, [employer], Rules, [employer] Regulations. Ignorance of the Laws does not constitute a valid excuse.  Exceptions: There are only two exceptions to the rule of not receiving pain medications from other Healthcare Providers. 1. Exception #1 (Emergencies): In the event of an emergency (i.e.: accident requiring emergency care), you are allowed to receive additional pain medication. However, you are responsible for: As soon as you are able, call our office [phone], at any time of the day or night, and leave a message stating your name, the date and nature of the emergency, and the name and dose of the medication prescribed. In the event that your call is answered by a member of our staff, make sure to document and save the date, time, and the name of the person that took your information.  2. Exception #2 (Planned Surgery): In the event that you are scheduled by another doctor or dentist to have any type of surgery or procedure, you are allowed (for a period no longer than 30 days), to receive additional pain medication, for the  acute post-op pain. However, in this case, you are responsible for picking up a copy of our "Post-op Pain Management for Surgeons" handout, and giving it to your surgeon or dentist. This document is available at our office, and does not require an appointment to obtain it. Simply go to our office during business hours (Monday-Thursday from 8:00 AM to 4:00 PM) (Friday 8:00 AM to 12:00 Noon) or if you have a scheduled appointment with us, prior to your surgery, and ask for it by name. In addition, you will need to provide us with your name, name of your surgeon, type of surgery, and date of procedure or surgery. 3. CALL US IF YOU NEED US FOR ANY PROCEDURES  ____________________________________________________________________________________________

## 2016-08-25 ENCOUNTER — Encounter: Payer: Self-pay | Admitting: Nurse Practitioner

## 2016-08-25 ENCOUNTER — Ambulatory Visit
Admission: RE | Admit: 2016-08-25 | Discharge: 2016-08-25 | Disposition: A | Discharge status: Home / Self Care | Payer: BLUE CROSS/BLUE SHIELD | Source: Ambulatory Visit | Attending: Nurse Practitioner | Admitting: Nurse Practitioner

## 2016-08-25 ENCOUNTER — Ambulatory Visit: Payer: BLUE CROSS/BLUE SHIELD | Attending: Nurse Practitioner | Admitting: Nurse Practitioner

## 2016-08-25 ENCOUNTER — Ambulatory Visit
Admission: RE | Admit: 2016-08-25 | Discharge: 2016-08-25 | Disposition: A | Discharge status: Home / Self Care | Payer: BLUE CROSS/BLUE SHIELD | Source: Ambulatory Visit | Attending: Pain Medicine | Admitting: Pain Medicine

## 2016-08-25 VITALS — BP 140/86 | HR 78 | Temp 98.3°F | Resp 16 | Ht 66.0 in | Wt 255.0 lb

## 2016-08-25 DIAGNOSIS — Z79899 Other long term (current) drug therapy: Secondary | ICD-10-CM | POA: Insufficient documentation

## 2016-08-25 DIAGNOSIS — M791 Myalgia: Secondary | ICD-10-CM | POA: Diagnosis not present

## 2016-08-25 DIAGNOSIS — M533 Sacrococcygeal disorders, not elsewhere classified: Secondary | ICD-10-CM | POA: Insufficient documentation

## 2016-08-25 DIAGNOSIS — M4696 Unspecified inflammatory spondylopathy, lumbar region: Secondary | ICD-10-CM

## 2016-08-25 DIAGNOSIS — M47816 Spondylosis without myelopathy or radiculopathy, lumbar region: Secondary | ICD-10-CM

## 2016-08-25 DIAGNOSIS — M545 Low back pain: Secondary | ICD-10-CM | POA: Diagnosis not present

## 2016-08-25 DIAGNOSIS — Z5181 Encounter for therapeutic drug level monitoring: Secondary | ICD-10-CM | POA: Insufficient documentation

## 2016-08-25 DIAGNOSIS — E119 Type 2 diabetes mellitus without complications: Secondary | ICD-10-CM | POA: Insufficient documentation

## 2016-08-25 DIAGNOSIS — M5136 Other intervertebral disc degeneration, lumbar region: Secondary | ICD-10-CM | POA: Diagnosis not present

## 2016-08-25 DIAGNOSIS — G8929 Other chronic pain: Secondary | ICD-10-CM

## 2016-08-25 DIAGNOSIS — M7918 Myalgia, other site: Secondary | ICD-10-CM

## 2016-08-25 DIAGNOSIS — Z79891 Long term (current) use of opiate analgesic: Secondary | ICD-10-CM

## 2016-08-25 DIAGNOSIS — M792 Neuralgia and neuritis, unspecified: Secondary | ICD-10-CM

## 2016-08-25 DIAGNOSIS — M47896 Other spondylosis, lumbar region: Secondary | ICD-10-CM | POA: Insufficient documentation

## 2016-08-25 DIAGNOSIS — G894 Chronic pain syndrome: Secondary | ICD-10-CM | POA: Diagnosis not present

## 2016-08-25 DIAGNOSIS — Z794 Long term (current) use of insulin: Secondary | ICD-10-CM | POA: Insufficient documentation

## 2016-08-25 DIAGNOSIS — K589 Irritable bowel syndrome without diarrhea: Secondary | ICD-10-CM | POA: Diagnosis not present

## 2016-08-25 DIAGNOSIS — M25511 Pain in right shoulder: Secondary | ICD-10-CM | POA: Insufficient documentation

## 2016-08-25 DIAGNOSIS — M25561 Pain in right knee: Secondary | ICD-10-CM | POA: Insufficient documentation

## 2016-08-25 DIAGNOSIS — Z6841 Body Mass Index (BMI) 40.0 and over, adult: Secondary | ICD-10-CM | POA: Diagnosis not present

## 2016-08-25 MED ORDER — OXYCODONE HCL 5 MG PO TABS: 5.0000 mg | ORAL_TABLET | Freq: Four times a day (QID) | ORAL | 0 refills | Status: DC | PRN

## 2016-08-25 MED ORDER — GABAPENTIN 600 MG PO TABS: 600.0000 mg | ORAL_TABLET | Freq: Four times a day (QID) | ORAL | 0 refills | Status: DC

## 2016-08-25 MED ORDER — ORPHENADRINE CITRATE 30 MG/ML IJ SOLN
INTRAMUSCULAR | Status: AC
  Filled 2016-08-25: qty 2

## 2016-08-25 MED ORDER — KETOROLAC TROMETHAMINE 60 MG/2ML IM SOLN
60.0000 mg | Freq: Once | INTRAMUSCULAR | Status: AC
  Administered 2016-08-25: 60 mg via INTRAMUSCULAR

## 2016-08-25 MED ORDER — ORPHENADRINE CITRATE 30 MG/ML IJ SOLN
60.0000 mg | Freq: Once | INTRAMUSCULAR | Status: AC
  Administered 2016-08-25: 60 mg via INTRAMUSCULAR

## 2016-08-25 MED ORDER — KETOROLAC TROMETHAMINE 60 MG/2ML IM SOLN
INTRAMUSCULAR | Status: AC
  Filled 2016-08-25: qty 2

## 2016-08-25 NOTE — Progress Notes (Signed)
Patient's Name: Adam Petty.  MRN: [id number]  Referring Provider: No ref. provider found  DOB: [birth year]  PCP: Patient, No Pcp Per  DOS: 08/25/2016  Note by: Vevelyn Francois NP  Service setting: Ambulatory outpatient  Specialty: Interventional Pain Management  Location: [facility] (AMB) Pain Management Facility    Patient type: Established    Primary Reason(s) for Visit: Encounter for prescription drug management. (Level of risk: moderate)  CC: Back Pain (low); Shoulder Pain (right); and Knee Pain (right)  HPI  Adam Petty is a 56 y.o. year old, male patient, who comes today for a medication management evaluation. 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); Discogenic syndrome, lumbar (WC injury); Osteoarthritis of spine with radiculopathy, lumbar region (WC injury); Lumbar facet syndrome (WC injury) (Bilateral) (R>L); Lumbosacral radiculopathy (WC injury); Pain in right knee (WC injury); Obesity, Class III, BMI 40-49.9 (morbid obesity) ([place]); Uncomplicated opioid dependence ([place]); Therapeutic opioid-induced constipation (OIC); Dyslipidemia; Microalbuminuria; Adiposity; Lumbar canal stenosis; Type 2 diabetes mellitus ([place]); Long term prescription opiate use; Encounter for chronic pain management; Neurogenic pain; Neuropathic pain; Musculoskeletal pain; Lumbar spondylosis; Chronic knee pain (Location of Secondary source of pain) (Right); Osteoarthritis of knee (Location of Secondary source of pain) (Right); Chronic shoulder pain (Location of Tertiary source of pain) (Right); Osteoarthritis of shoulder (Location of Tertiary source of pain) (Right); Chronic lower extremity pain (Right); Recurrent nephrolithiasis; Acute medial meniscal  tear; Chronic pain syndrome (WC injury); Acute low back pain without sciatica; Osteoarthritis of glenohumeral joint (Right); and Sacroiliac joint pain on his problem list. His primarily concern today is the Back Pain (low); Shoulder Pain (right); and Knee Pain (right)  Pain Assessment: Location: Lower (right shoulder and right knee) Back (shoulder knee) Radiating: radiates to right hip at times Onset: More than a month ago Duration: Chronic pain Quality: Sharp, Constant Severity: 6 /10 (self-reported pain score)  Note: Reported level is compatible with observation.                   Effect on ADL:   Timing: Constant Modifying factors: heat, lying down  Adam Petty was last scheduled for an appointment on 05/05/16 for medication management. During today's appointment we reviewed [name] chronic pain status, as well as his outpatient medication regimen. He states that he is just not able to get above the pain.  He admits that he has had to leave work several days secondary to the pain. He state that the pain is worse on the right and radiates into his hips. He denies any recent injuries. He was seen by otho on yesterday and did receive an injection in his shoulder.   The patient  reports that he does not use drugs. His body mass index is 41.16 kg/m.  Further details on both, my assessment(s), as well as the proposed treatment plan, please see below.  Controlled Substance Pharmacotherapy Assessment REMS (Risk Evaluation and Mitigation Strategy)  Analgesic:Oxycodone IR 5 mg 1 tablet by mouth every 6 hours (20 mg/day) MME/day:30 mg/day  Dewayne Shorter, RN  08/25/2016  8:55 AM  Addendum Nursing Pain Medication Assessment:  Safety precautions to be maintained throughout the outpatient stay will include: orient to surroundings, keep bed in low position, maintain call bell within reach at  all times, provide assistance with transfer out of bed and ambulation.  Medication Inspection Compliance:  Pill count conducted under aseptic conditions, in front of the patient. Neither the pills nor the bottle was removed from the patient's sight at any time. Once count was completed pills were immediately returned to the patient in their original bottle.  Medication: Oxycodone IR Pill/Patch Count: 90 of 120 pills remain Pill/Patch Appearance: Markings consistent with prescribed medication Bottle Appearance: Standard pharmacy container. Clearly labeled. Filled Date: 07 / 31/ 2018 Last Medication intake:  Today   Had an injection 8--8-18 in right shoulder by Dr Esmond Plants (sub acromial cortisone injection)  # [phone]   Pharmacokinetics: Liberation and absorption (onset of action): WNL Distribution (time to peak effect): WNL Metabolism and excretion (duration of action): WNL         Pharmacodynamics: Desired effects: Analgesia: Adam Petty reports >50% benefit. Functional ability: Patient reports that medication allows him to accomplish basic ADLs Clinically meaningful improvement in function (CMIF): Sustained CMIF goals met Perceived effectiveness: Described as relatively effective, allowing for increase in activities of daily living (ADL) Undesirable effects: Side-effects or Adverse reactions: None reported Monitoring: [place] PMP: Online review of the past 51-monthperiod conducted. Compliant with practice rules and regulations List of all UDS test(s) done:  Lab Results  Component Value Date   TOXASSSELUR FINAL 03/12/2015   TOXASSSELUR FINAL 12/22/2014   Last UDS on record: ToxAssure Select 13  Date Value Ref Range Status  03/12/2015 FINAL  Final    Comment:    ==================================================================== TOXASSURE SELECT 13 (MW) ==================================================================== Test                             Result       Flag       Units Drug Present and Declared for Prescription Verification   Oxycodone                      892           EXPECTED   ng/mg creat   Oxymorphone                    524          EXPECTED   ng/mg creat   Noroxycodone                   1363         EXPECTED   ng/mg creat   Noroxymorphone                 178          EXPECTED   ng/mg creat    Sources of oxycodone are scheduled prescription medications.    Oxymorphone, noroxycodone, and noroxymorphone are expected    metabolites of oxycodone. Oxymorphone is also available as a    scheduled prescription medication. ==================================================================== Test                      Result    Flag   Units      Ref Range   Creatinine              49               mg/dL      >=20 ==================================================================== Declared Medications:  The flagging and interpretation on this report are based on the  following declared medications.  Unexpected results may arise from  inaccuracies in the declared medications.  **Note: The testing scope of this panel includes these medications:  Oxycodone  **Note: The testing scope of this panel does not include following  reported medications:  Bisacodyl  Canagliflozin (Invokamet)  Gabapentin  Insulin (NovoLog)  Liraglutide (Victoza)  Metformin (Invokamet)  Ramipril  Rosuvastatin  Supplement ==================================================================== For clinical consultation, please call [phone]. ====================================================================    UDS interpretation: Compliant          Medication Assessment Form: Reviewed. Patient indicates being compliant with therapy Treatment compliance: Compliant Risk Assessment Profile: Aberrant behavior: See prior evaluations. None observed or detected today Comorbid factors increasing risk of overdose: See prior notes. No additional risks detected today Risk of substance use disorder (SUD): Low     Opioid Risk Tool - 08/25/16 0811      Family History of  Substance Abuse   Alcohol Negative   Illegal Drugs Negative   Rx Drugs Negative     Total Score   Opioid Risk Tool Scoring 0   Opioid Risk Interpretation Low Risk     ORT Scoring interpretation table:  Score <3 = Low Risk for SUD  Score between 4-7 = Moderate Risk for SUD  Score >8 = High Risk for Opioid Abuse   Risk Mitigation Strategies:  Patient Counseling: Covered Patient-Prescriber Agreement (PPA): Present and active  Notification to other healthcare providers: Done  Pharmacologic Plan: No change in therapy, at this time  Laboratory Chemistry  Inflammation Markers (CRP: Acute Phase) (ESR: Chronic Phase) Lab Results  Component Value Date   CRP 0.6 03/12/2015   ESRSEDRATE 9 03/12/2015                 Renal Function Markers Lab Results  Component Value Date   BUN 15 03/12/2015   CREATININE 0.82 03/12/2015   GFRAA >60 03/12/2015   GFRNONAA >60 03/12/2015                 Hepatic Function Markers Lab Results  Component Value Date   AST 41 03/12/2015   ALT 55 03/12/2015   ALBUMIN 4.2 03/12/2015   ALKPHOS 89 03/12/2015                 Electrolytes Lab Results  Component Value Date   NA 140 03/12/2015   K 4.2 03/12/2015   CL 105 03/12/2015   CALCIUM 10.0 03/12/2015   MG 1.8 03/12/2015                 Neuropathy Markers No results found for: [id number]               Bone Pathology Markers Lab Results  Component Value Date   ALKPHOS 89 03/12/2015   CALCIUM 10.0 03/12/2015                 Coagulation Parameters Lab Results  Component Value Date   PLT 176 03/13/2015                 Cardiovascular Markers Lab Results  Component Value Date   HGB 15.3 03/13/2015   HCT 46.4 03/13/2015                 Note: Lab results reviewed.  Recent Diagnostic Imaging Review  Dg C-arm 1-60 Min-no Report  Result Date: 06/22/2016 Fluoroscopy was utilized by the requesting physician.  No radiographic interpretation.   Note: Imaging results reviewed.  Meds   Current Meds  Medication Sig  . cyanocobalamin (,VITAMIN B-12,) 1000 MCG/ML injection Inject into the muscle every 30 (thirty) days.   Derrill Memo ON 09/09/2016] gabapentin (NEURONTIN) 600 MG tablet Take 1 tablet (600 mg total) by mouth every 6 (six) hours.  . insulin degludec (TRESIBA FLEXTOUCH) 100 UNIT/ML SOPN FlexTouch Pen Inject 40 Units into the skin daily.   Marland Kitchen ketoconazole (NIZORAL) 2 % shampoo APPLY TO AFFECTED AREA EVERY DAY AS NEEDED  . liraglutide (VICTOZA) 18 MG/3ML SOPN INJECT 0.3 MLS (1.8 MG TOTAL) SUBCUTANEOUSLY ONCE DAILY.  Marland Kitchen Liraglutide -Weight Management (SAXENDA) 18 MG/3ML SOPN Inject into the skin.  . metFORMIN (GLUCOPHAGE) 1000 MG tablet TAKE 1 TABLET (1,000 MG TOTAL) BY MOUTH 2 (TWO) TIMES DAILY WITH MEALS.  Marland Kitchen NOVOLOG FLEXPEN 100 UNIT/ML FlexPen Inject 0-10 Units into the skin daily as needed for high blood sugar (sliding scale). Reported on 02/24/2015  . [START ON 09/09/2016] oxyCODONE (OXY IR/ROXICODONE) 5 MG immediate release tablet Take 1 tablet (5 mg total) by mouth every 6 (six) hours as needed for severe pain.  . phentermine 30 MG capsule TAKE ONE CAPSULE BY MOUTH EVERY MORNING BEFORE BREAKFAST  . potassium citrate (UROCIT-K) 10 MEQ (1080 MG) SR tablet Take 10 mEq by mouth 2 (two) times daily.  . ramipril (ALTACE) 5 MG capsule Take 5 mg by mouth every morning.   . rosuvastatin (CRESTOR) 5 MG tablet Take 5 mg by mouth 3 (three) times a week. Monday Wednesday Friday  . tapentadol (NUCYNTA) 50 MG tablet Take 50 mg by mouth as needed. For kidney stones  . [DISCONTINUED] gabapentin (NEURONTIN) 600 MG tablet Take 1 tablet (600 mg total) by mouth every 6 (six) hours.  . [DISCONTINUED] oxyCODONE (OXY IR/ROXICODONE) 5 MG immediate release tablet Take 1 tablet (5 mg total) by mouth every 6 (six) hours as needed for severe pain.    ROS  Constitutional: Denies any fever or chills Gastrointestinal: No reported hemesis, hematochezia, vomiting, or acute GI  distress Musculoskeletal: Denies any acute onset joint swelling, redness, loss of ROM, or weakness Neurological: No reported episodes of acute onset apraxia, aphasia, dysarthria, agnosia, amnesia, paralysis, loss of coordination, or loss of consciousness  Allergies  Mr. Cogburn has No Known Allergies.  [place]  Drug: Mr. Delmundo  reports that he does not use drugs. Alcohol:  reports that he drinks alcohol. Tobacco:  reports that he has never smoked. He has never used smokeless tobacco. Medical:  has a past medical history of Arthritis, senescent (10/22/2014); Back pain; Chronic pain syndrome (10/22/2014); Diabetes mellitus without complication ([place]); History of kidney stones; IBS (irritable bowel syndrome); Lateral meniscus tear; Preventative health care; Sleep apnea; and Spinal stenosis. Surgical: Mr. Mells  has a past surgical history that includes Tonsillectomy; Shoulder surgery; Palate surgery; Cervical fusion; Foot surgery; Lumbar laminectomy/decompression microdiscectomy (N/A, 02/06/2013); Knee arthroscopy (Right, 10/02/2013); Knee arthroscopy with medial menisectomy (Right, 03/25/2015); Knee bursectomy (Right, 03/25/2015); and Cystoscopy w/ retrogrades (Right, 11/19/2015). Family: family history includes COPD in his mother; Cancer in his mother; Diabetes in his father; Heart disease in his father and mother.  Constitutional Exam  General appearance: Well nourished, well developed, and well hydrated. In no apparent acute distress Vitals:   08/25/16 0803 08/25/16 0806  BP:  140/86  Pulse: 78   Resp: 16   Temp: 98.3 F (36.8 C)   SpO2: 100%   Weight: 255 lb (115.7 kg)   Height: '5\' 6"'  (1.676 m)    BMI Assessment: Estimated body mass index  is 41.16 kg/m as calculated from the following:   Height as of this encounter: '5\' 6"'  (1.676 m).   Weight as of this encounter: 255 lb (115.7 kg).  BMI interpretation table: BMI level Category Range association with higher incidence of chronic pain  <18  kg/m2 Underweight   18.5-24.9 kg/m2 Ideal body weight   25-29.9 kg/m2 Overweight Increased incidence by 20%  30-34.9 kg/m2 Obese (Class I) Increased incidence by 68%  35-39.9 kg/m2 Severe obesity (Class II) Increased incidence by 136%  >40 kg/m2 Extreme obesity (Class III) Increased incidence by 254%   BMI Readings from Last 4 Encounters:  08/25/16 41.16 kg/m  07/12/16 39.54 kg/m  06/22/16 39.54 kg/m  05/05/16 39.54 kg/m   Wt Readings from Last 4 Encounters:  08/25/16 255 lb (115.7 kg)  07/12/16 245 lb (111.1 kg)  06/22/16 245 lb (111.1 kg)  05/05/16 245 lb (111.1 kg)  Psych/Mental status: Alert, oriented x 3 (person, place, & time)       Eyes: PERLA Respiratory: No evidence of acute respiratory distress  Cervical Spine Area Exam  Skin & Axial Inspection: No masses, redness, edema, swelling, or associated skin lesions Alignment: Symmetrical Functional ROM: Unrestricted ROM      Stability: No instability detected Muscle Tone/Strength: Functionally intact. No obvious neuro-muscular anomalies detected. Sensory (Neurological): Unimpaired Palpation: No palpable anomalies              Upper Extremity (UE) Exam    Side: Right upper extremity  Side: Left upper extremity  Skin & Extremity Inspection: Skin color, temperature, and hair growth are WNL. No peripheral edema or cyanosis. No masses, redness, swelling, asymmetry, or associated skin lesions. No contractures.  Skin & Extremity Inspection: Skin color, temperature, and hair growth are WNL. No peripheral edema or cyanosis. No masses, redness, swelling, asymmetry, or associated skin lesions. No contractures.  Functional ROM: Unrestricted ROM          Functional ROM: Unrestricted ROM          Muscle Tone/Strength: Functionally intact. No obvious neuro-muscular anomalies detected.  Muscle Tone/Strength: Functionally intact. No obvious neuro-muscular anomalies detected.  Sensory (Neurological): Unimpaired  Sensory (Neurological):  Unimpaired  Palpation: No palpable anomalies              Palpation: No palpable anomalies              Specialized Test(s): Deferred         Specialized Test(s): Deferred          Thoracic Spine Area Exam  Skin & Axial Inspection: No masses, redness, or swelling Alignment: Symmetrical Functional ROM: Unrestricted ROM Stability: No instability detected Muscle Tone/Strength: Functionally intact. No obvious neuro-muscular anomalies detected. Sensory (Neurological): Unimpaired Muscle strength & Tone: No palpable anomalies  Lumbar Spine Area Exam  Skin & Axial Inspection: Well healed scar from previous spine surgery detected Alignment: Symmetrical Functional ROM: Unrestricted ROM      Stability: No instability detected Muscle Tone/Strength: Functionally intact. No obvious neuro-muscular anomalies detected. Sensory (Neurological): Unimpaired Palpation: Complains of area being tender to palpation       Provocative Tests: Lumbar Hyperextension and rotation test: Positive bilaterally for facet joint pain. Lumbar Lateral bending test: Positive ipsilateral radicular pain, on the right. Positive for right-sided foraminal stenosis. Patrick's Maneuver: Positive for right-sided S-I arthralgia              Gait & Posture Assessment  Ambulation: Unassisted Gait: Relatively normal for age and body habitus Posture: WNL  Lower Extremity Exam    Side: Right lower extremity  Side: Left lower extremity  Skin & Extremity Inspection: Skin color, temperature, and hair growth are WNL. No peripheral edema or cyanosis. No masses, redness, swelling, asymmetry, or associated skin lesions. No contractures.  Skin & Extremity Inspection: Skin color, temperature, and hair growth are WNL. No peripheral edema or cyanosis. No masses, redness, swelling, asymmetry, or associated skin lesions. No contractures.  Functional ROM: Unrestricted ROM          Functional ROM: Unrestricted ROM          Muscle Tone/Strength:  Functionally intact. No obvious neuro-muscular anomalies detected.  Muscle Tone/Strength: Functionally intact. No obvious neuro-muscular anomalies detected.  Sensory (Neurological): Unimpaired  Sensory (Neurological): Unimpaired  Palpation: No palpable anomalies  Palpation: No palpable anomalies   Assessment  Primary Diagnosis & Pertinent Problem List: The primary encounter diagnosis was Lumbar facet syndrome (WC injury) (Bilateral) (R>L). Diagnoses of Lumbar spondylosis, Chronic sacroiliac joint pain, Chronic knee pain (Location of Secondary source of pain) (Right), Neurogenic pain, Musculoskeletal pain, Chronic pain syndrome, and Long term current use of opiate analgesic were also pertinent to this visit.  Status Diagnosis  Controlled Controlled Controlled 1. Lumbar facet syndrome (WC injury) (Bilateral) (R>L)   2. Lumbar spondylosis   3. Chronic sacroiliac joint pain   4. Chronic knee pain (Location of Secondary source of pain) (Right)   5. Neurogenic pain   6. Musculoskeletal pain   7. Chronic pain syndrome   8. Long term current use of opiate analgesic     Problems updated and reviewed during this visit: Problem  Sacroiliac Joint Pain   Plan of Care  Pharmacotherapy (Medications Ordered): Meds ordered this encounter  Medications  . oxyCODONE (OXY IR/ROXICODONE) 5 MG immediate release tablet    Sig: Take 1 tablet (5 mg total) by mouth every 6 (six) hours as needed for severe pain.    Dispense:  120 tablet    Refill:  0    Do not place this medication, or any other prescription from our practice, on "Automatic Refill". Patient may have prescription filled one day early if pharmacy is closed on scheduled refill date. Do not fill until: 09/09/2016 To last until:10/09/2016  . oxyCODONE (OXY IR/ROXICODONE) 5 MG immediate release tablet    Sig: Take 1 tablet (5 mg total) by mouth every 6 (six) hours as needed for severe pain.    Dispense:  120 tablet    Refill:  0    Do not place  this medication, or any other prescription from our practice, on "Automatic Refill". Patient may have prescription filled one day early if pharmacy is closed on scheduled refill date. Do not fill until: 10/09/2016 To last until:11/08/2016  . oxyCODONE (OXY IR/ROXICODONE) 5 MG immediate release tablet    Sig: Take 1 tablet (5 mg total) by mouth every 6 (six) hours as needed for severe pain.    Dispense:  120 tablet    Refill:  0    Do not place this medication, or any other prescription from our practice, on "Automatic Refill". Patient may have prescription filled one day early if pharmacy is closed on scheduled refill date. Do not fill until:11/08/2016 To last until: 12/08/2016  . gabapentin (NEURONTIN) 600 MG tablet    Sig: Take 1 tablet (600 mg total) by mouth every 6 (six) hours.    Dispense:  360 tablet    Refill:  0    Do not place this medication, or  any other prescription from our practice, on "Automatic Refill". Patient may have prescription filled one day early if pharmacy is closed on scheduled refill date.  . orphenadrine (NORFLEX) injection 60 mg  . ketorolac (TORADOL) injection 60 mg   New Prescriptions   No medications on file   Medications administered today: We administered orphenadrine and ketorolac. Lab-work, procedure(s), and/or referral(s): Orders Placed This Encounter  Procedures  . BLOCK OF NERVES TO THE SACROILIAC JOINT  . DG Si Joints  . DG Lumbar Spine Complete W/Bend  . ToxASSURE Select 13 (MW), Urine   Imaging and/or referral(s): None  Interventional therapies: Planned, scheduled, and/or pending:   Sacroiliac joint nerve block without sedation    Considering:   Palliativebilateral lumbar facet block  Palliativebilateral lumbar facet radiofrequency ablation (Right side done 04/13/2016) (100% relief) Diagnostic right knee genicular nerve block .  Possible right knee genicular nerve radiofrequency ablation   Palliative PRN treatment(s):    Diagnostic Right Suprascapular nerve Block #2 Possible Right Suprascapular nerve RFA    Provider-requested follow-up: Return in about 3 months (around 11/25/2016) for MedMgmt, w/ Dr. Dossie Arbour, Massie Kluver), Procedure(NS).  Future Appointments Date Time Provider [place]  11/22/2016 8:30 AM Vevelyn Francois, NP [facility] None   Primary Care Physician: Patient, No Pcp Per Location: [facility] Note by: Vevelyn Francois NP Date: 08/25/2016; Time: 11:37 AM  Pain Score Disclaimer: We use the NRS-11 scale. This is a self-reported, subjective measurement of pain severity with only modest accuracy. It is used primarily to identify changes within a particular patient. It must be understood that outpatient pain scales are significantly less accurate that those used for research, where they can be applied under ideal controlled circumstances with minimal exposure to variables. In reality, the score is likely to be a combination of pain intensity and pain affect, where pain affect describes the degree of emotional arousal or changes in action readiness caused by the sensory experience of pain. Factors such as social and work situation, setting, emotional state, anxiety levels, expectation, and prior pain experience may influence pain perception and show large inter-individual differences that may also be affected by time variables.  Patient instructions provided during this appointment: Patient Instructions   ____________________________________________________________________________________________  Medication Rules  Applies to: All patients receiving prescriptions (written or electronic).  Pharmacy of record: Pharmacy where electronic prescriptions will be sent. If written prescriptions are taken to a different pharmacy, please inform the nursing staff. The pharmacy listed in the electronic medical record should be the one where you would like electronic prescriptions to  be sent.  Prescription refills: Only during scheduled appointments. Applies to both, written and electronic prescriptions.  NOTE: The following applies primarily to controlled substances (Opioid* Pain Medications).   Patient's responsibilities: 1. Pain Pills: Bring all pain pills to every appointment (except for procedure appointments). 2. Pill Bottles: Bring pills in original pharmacy bottle. Always bring newest bottle. Bring bottle, even if empty. 3. Medication refills: You are responsible for knowing and keeping track of what medications you need refilled. The day before your appointment, write a list of all prescriptions that need to be refilled. Bring that list to your appointment and give it to the admitting nurse. Prescriptions will be written only during appointments. If you forget a medication, it will not be "Called in", "Faxed", or "electronically sent". You will need to get another appointment to get these prescribed. 4. Prescription Accuracy: You are responsible for carefully inspecting your prescriptions before leaving our office.  Have the discharge nurse carefully go over each prescription with you, before taking them home. Make sure that your name is accurately spelled, that your address is correct. Check the name and dose of your medication to make sure it is accurate. Check the number of pills, and the written instructions to make sure they are clear and accurate. Make sure that you are given enough medication to last until your next medication refill appointment. 5. Taking Medication: Take medication as prescribed. Never take more pills than instructed. Never take medication more frequently than prescribed. Taking less pills or less frequently is permitted and encouraged, when it comes to controlled substances (written prescriptions).  6. Inform other Doctors: Always inform, all of your healthcare providers, of all the medications you take. 7. Pain Medication from other Providers: You  are not allowed to accept any additional pain medication from any other Doctor or Healthcare provider. There are two exceptions to this rule. (see below) In the event that you require additional pain medication, you are responsible for notifying us, as stated below. 8. Medication Agreement: You are responsible for carefully reading and following our Medication Agreement. This must be signed before receiving any prescriptions from our practice. Safely store a copy of your signed Agreement. Violations to the Agreement will result in no further prescriptions. (Additional copies of our Medication Agreement are available upon request.) 9. Laws, Rules, & Regulations: All patients are expected to follow all Federal and [employer], [employer], Rules, [employer]. Ignorance of the Laws does not constitute a valid excuse. The use of any illegal substances is prohibited. 10. Adopted CDC guidelines & recommendations: Target dosing levels will be at or below 60 MME/day. Use of benzodiazepines** is not recommended.  Exceptions: There are only two exceptions to the rule of not receiving pain medications from other Healthcare Providers. 1. Exception #1 (Emergencies): In the event of an emergency (i.e.: accident requiring emergency care), you are allowed to receive additional pain medication. However, you are responsible for: As soon as you are able, call our office [phone], at any time of the day or night, and leave a message stating your name, the date and nature of the emergency, and the name and dose of the medication prescribed. In the event that your call is answered by a member of our staff, make sure to document and save the date, time, and the name of the person that took your information.  2. Exception #2 (Planned Surgery): In the event that you are scheduled by another doctor or dentist to have any type of surgery or procedure, you are allowed (for a period no longer than 30 days), to receive additional pain  medication, for the acute post-op pain. However, in this case, you are responsible for picking up a copy of our "Post-op Pain Management for Surgeons" handout, and giving it to your surgeon or dentist. This document is available at our office, and does not require an appointment to obtain it. Simply go to our office during business hours (Monday-Thursday from 8:00 AM to 4:00 PM) (Friday 8:00 AM to 12:00 Noon) or if you have a scheduled appointment with [place], prior to your surgery, and ask for it by name. In addition, you will need to provide [place] with your name, name of your surgeon, type of surgery, and date of procedure or surgery.  *Opioid medications include: morphine, codeine, oxycodone, oxymorphone, hydrocodone, hydromorphone, meperidine, tramadol, tapentadol, buprenorphine, fentanyl, methadone. **Benzodiazepine medications include: diazepam (Valium), alprazolam (Xanax), clonazepam (Klonopine), lorazepam (Ativan),  clorazepate (Tranxene), chlordiazepoxide (Librium), estazolam (Prosom), oxazepam (Serax), temazepam (Restoril), triazolam (Halcion)  You were given 3 prescriptions for Oxycodone today. A prescription for Gabapentin was sent to your pharmacy.  ____________________________________________________________________________________________

## 2016-08-25 NOTE — Progress Notes (Addendum)
Nursing Pain Medication Assessment:  Safety precautions to be maintained throughout the outpatient stay will include: orient to surroundings, keep bed in low position, maintain call bell within reach at all times, provide assistance with transfer out of bed and ambulation.  Medication Inspection Compliance: Pill count conducted under aseptic conditions, in front of the patient. Neither the pills nor the bottle was removed from the patient's sight at any time. Once count was completed pills were immediately returned to the patient in their original bottle.  Medication: Oxycodone IR Pill/Patch Count: 90 of 120 pills remain Pill/Patch Appearance: Markings consistent with prescribed medication Bottle Appearance: Standard pharmacy container. Clearly labeled. Filled Date: 07 / 31/ 2018 Last Medication intake:  Today   Had an injection 8--8-18 in right shoulder by Dr Malon KindleSteven Norris (sub acromial cortisone injection)  # [id number]

## 2016-08-25 NOTE — Patient Instructions (Addendum)
____________________________________________________________________________________________  Medication Rules  Applies to: All patients receiving prescriptions (written or electronic).  Pharmacy of record: Pharmacy where electronic prescriptions will be sent. If written prescriptions are taken to a different pharmacy, please inform the nursing staff. The pharmacy listed in the electronic medical record should be the one where you would like electronic prescriptions to be sent.  Prescription refills: Only during scheduled appointments. Applies to both, written and electronic prescriptions.  NOTE: The following applies primarily to controlled substances (Opioid* Pain Medications).   Patient's responsibilities: 1. Pain Pills: Bring all pain pills to every appointment (except for procedure appointments). 2. Pill Bottles: Bring pills in original pharmacy bottle. Always bring newest bottle. Bring bottle, even if empty. 3. Medication refills: You are responsible for knowing and keeping track of what medications you need refilled. The day before your appointment, write a list of all prescriptions that need to be refilled. Bring that list to your appointment and give it to the admitting nurse. Prescriptions will be written only during appointments. If you forget a medication, it will not be "Called in", "Faxed", or "electronically sent". You will need to get another appointment to get these prescribed. 4. Prescription Accuracy: You are responsible for carefully inspecting your prescriptions before leaving our office. Have the discharge nurse carefully go over each prescription with you, before taking them home. Make sure that your name is accurately spelled, that your address is correct. Check the name and dose of your medication to make sure it is accurate. Check the number of pills, and the written instructions to make sure they are clear and accurate. Make sure that you are given enough medication to  last until your next medication refill appointment. 5. Taking Medication: Take medication as prescribed. Never take more pills than instructed. Never take medication more frequently than prescribed. Taking less pills or less frequently is permitted and encouraged, when it comes to controlled substances (written prescriptions).  6. Inform other Doctors: Always inform, all of your healthcare providers, of all the medications you take. 7. Pain Medication from other Providers: You are not allowed to accept any additional pain medication from any other Doctor or Healthcare provider. There are two exceptions to this rule. (see below) In the event that you require additional pain medication, you are responsible for notifying us, as stated below. 8. Medication Agreement: You are responsible for carefully reading and following our Medication Agreement. This must be signed before receiving any prescriptions from our practice. Safely store a copy of your signed Agreement. Violations to the Agreement will result in no further prescriptions. (Additional copies of our Medication Agreement are available upon request.) 9. Laws, Rules, & Regulations: All patients are expected to follow all Federal and State Laws, Statutes, Rules, & Regulations. Ignorance of the Laws does not constitute a valid excuse. The use of any illegal substances is prohibited. 10. Adopted CDC guidelines & recommendations: Target dosing levels will be at or below 60 MME/day. Use of benzodiazepines** is not recommended.  Exceptions: There are only two exceptions to the rule of not receiving pain medications from other Healthcare Providers. 1. Exception #1 (Emergencies): In the event of an emergency (i.e.: accident requiring emergency care), you are allowed to receive additional pain medication. However, you are responsible for: As soon as you are able, call our office [phone], at any time of the day or night, and leave a message stating your  name, the date and nature of the emergency, and the name and dose of the medication   prescribed. In the event that your call is answered by a member of our staff, make sure to document and save the date, time, and the name of the person that took your information.  2. Exception #2 (Planned Surgery): In the event that you are scheduled by another doctor or dentist to have any type of surgery or procedure, you are allowed (for a period no longer than 30 days), to receive additional pain medication, for the acute post-op pain. However, in this case, you are responsible for picking up a copy of our "Post-op Pain Management for Surgeons" handout, and giving it to your surgeon or dentist. This document is available at our office, and does not require an appointment to obtain it. Simply go to our office during business hours (Monday-Thursday from 8:00 AM to 4:00 PM) (Friday 8:00 AM to 12:00 Noon) or if you have a scheduled appointment with us, prior to your surgery, and ask for it by name. In addition, you will need to provide us with your name, name of your surgeon, type of surgery, and date of procedure or surgery.  *Opioid medications include: morphine, codeine, oxycodone, oxymorphone, hydrocodone, hydromorphone, meperidine, tramadol, tapentadol, buprenorphine, fentanyl, methadone. **Benzodiazepine medications include: diazepam (Valium), alprazolam (Xanax), clonazepam (Klonopine), lorazepam (Ativan), clorazepate (Tranxene), chlordiazepoxide (Librium), estazolam (Prosom), oxazepam (Serax), temazepam (Restoril), triazolam (Halcion)  You were given 3 prescriptions for Oxycodone today. A prescription for Gabapentin was sent to your pharmacy.  ____________________________________________________________________________________________

## 2016-08-30 LAB — TOXASSURE SELECT 13 (MW), URINE

## 2016-08-30 NOTE — Progress Notes (Signed)
Results were reviewed and found to be: mildly abnormal  No acute injury or pathology identified  Review would suggest interventional pain management techniques may be of benefit 

## 2016-09-08 ENCOUNTER — Ambulatory Visit
Admission: RE | Admit: 2016-09-08 | Discharge: 2016-09-08 | Disposition: A | Discharge status: Home / Self Care | Payer: BLUE CROSS/BLUE SHIELD | Source: Ambulatory Visit | Attending: Pain Medicine | Admitting: Pain Medicine

## 2016-09-08 ENCOUNTER — Ambulatory Visit (HOSPITAL_BASED_OUTPATIENT_CLINIC_OR_DEPARTMENT_OTHER): Payer: BLUE CROSS/BLUE SHIELD | Admitting: Pain Medicine

## 2016-09-08 ENCOUNTER — Encounter: Payer: Self-pay | Admitting: Pain Medicine

## 2016-09-08 VITALS — BP 142/96 | HR 78 | Temp 98.1°F | Resp 22 | Ht 66.0 in | Wt 250.0 lb

## 2016-09-08 DIAGNOSIS — M545 Low back pain: Secondary | ICD-10-CM | POA: Insufficient documentation

## 2016-09-08 DIAGNOSIS — G8929 Other chronic pain: Secondary | ICD-10-CM

## 2016-09-08 DIAGNOSIS — Z794 Long term (current) use of insulin: Secondary | ICD-10-CM | POA: Insufficient documentation

## 2016-09-08 DIAGNOSIS — M533 Sacrococcygeal disorders, not elsewhere classified: Secondary | ICD-10-CM | POA: Diagnosis not present

## 2016-09-08 DIAGNOSIS — E119 Type 2 diabetes mellitus without complications: Secondary | ICD-10-CM | POA: Diagnosis not present

## 2016-09-08 MED ORDER — ROPIVACAINE HCL 2 MG/ML IJ SOLN
INTRAMUSCULAR | Status: AC
  Filled 2016-09-08: qty 10

## 2016-09-08 MED ORDER — METHYLPREDNISOLONE ACETATE 80 MG/ML IJ SUSP
80.0000 mg | Freq: Once | INTRAMUSCULAR | Status: AC
  Administered 2016-09-08: 80 mg via INTRA_ARTICULAR

## 2016-09-08 MED ORDER — LIDOCAINE HCL 2 % IJ SOLN
10.0000 mL | Freq: Once | INTRAMUSCULAR | Status: AC
  Administered 2016-09-08: 400 mg
  Filled 2016-09-08: qty 20

## 2016-09-08 MED ORDER — ROPIVACAINE HCL 2 MG/ML IJ SOLN
9.0000 mL | Freq: Once | INTRAMUSCULAR | Status: AC
  Administered 2016-09-08: 10 mL via INTRA_ARTICULAR

## 2016-09-08 MED ORDER — METHYLPREDNISOLONE ACETATE 80 MG/ML IJ SUSP
INTRAMUSCULAR | Status: AC
  Filled 2016-09-08: qty 1

## 2016-09-08 NOTE — Progress Notes (Signed)
Patient's Name: Adam Petty.  MRN: [id number]  Referring Provider: Barbette Merino, NP  DOB: 1960-06-02  PCP: Patient, No Pcp Per  DOS: 09/08/2016  Note by: Oswaldo Done, MD  Service setting: Ambulatory outpatient  Specialty: Interventional Pain Management  Patient type: Established  Location: [facility] (AMB) Pain Management Facility  Visit type: Interventional Procedure   Primary Reason for Visit: Interventional Pain Management Treatment. CC: Back Pain  Procedure:  Anesthesia, Analgesia, Anxiolysis:  Type: Diagnostic Sacroiliac Joint Steroid Injection Region: Superior Lumbosacral Region Level: PSIS (Posterior Superior Iliac Spine) Laterality: Bilateral  Type: Local Anesthesia with Moderate (Conscious) Sedation Local Anesthetic: Lidocaine 1% Route: Intravenous (IV) IV Access: Secured Sedation: Meaningful verbal contact was maintained at all times during the procedure  Indication(s): Analgesia and Anxiety  Indications: 1. Chronic sacroiliac joint pain   2. Chronic low back pain (WC injury) (Location of Primary Source of Pain) (Bilateral) (R>L)    Pain Score: Pre-procedure: 5 /10 Post-procedure: 1 /10  Pre-op Assessment:  Adam Petty is a 56 y.o. (year old), male patient, seen today for interventional treatment. He  has a past surgical history that includes Tonsillectomy; Shoulder surgery; Palate surgery; Cervical fusion; Foot surgery; Lumbar laminectomy/decompression microdiscectomy (N/A, 02/06/2013); Knee arthroscopy (Right, 10/02/2013); Knee arthroscopy with medial menisectomy (Right, 03/25/2015); Knee bursectomy (Right, 03/25/2015); and Cystoscopy w/ retrogrades (Right, 11/19/2015). Adam Petty has a current medication list which includes the following prescription(s): cyanocobalamin, gabapentin, insulin degludec, ketoconazole, liraglutide, liraglutide -weight management, metformin, novolog flexpen, oxycodone, oxycodone, oxycodone, phentermine, potassium citrate, ramipril,  rosuvastatin, and tapentadol. His primarily concern today is the Back Pain  Initial Vital Signs: Blood pressure (!) 146/76, pulse 79, temperature 98.1 F (36.7 C), resp. rate 16, height 5\' 6"  (1.676 m), weight 250 lb (113.4 kg), SpO2 99 %. BMI: Estimated body mass index is 40.35 kg/m as calculated from the following:   Height as of this encounter: 5\' 6"  (1.676 m).   Weight as of this encounter: 250 lb (113.4 kg).  Risk Assessment: Allergies: Reviewed. He has No Known Allergies.  Allergy Precautions: None required Coagulopathies: Reviewed. None identified.  Blood-thinner therapy: None at this time Active Infection(s): Reviewed. None identified. Adam Petty is afebrile  Site Confirmation: Adam Petty was asked to confirm the procedure and laterality before marking the site Procedure checklist: Completed Consent: Before the procedure and under the influence of no sedative(s), amnesic(s), or anxiolytics, the patient was informed of the treatment options, risks and possible complications. To fulfill our ethical and legal obligations, as recommended by the American Medical Association's Code of Ethics, I have informed the patient of my clinical impression; the nature and purpose of the treatment or procedure; the risks, benefits, and possible complications of the intervention; the alternatives, including doing nothing; the risk(s) and benefit(s) of the alternative treatment(s) or procedure(s); and the risk(s) and benefit(s) of doing nothing. The patient was provided information about the general risks and possible complications associated with the procedure. These may include, but are not limited to: failure to achieve desired goals, infection, bleeding, organ or nerve damage, allergic reactions, paralysis, and death. In addition, the patient was informed of those risks and complications associated to the procedure, such as failure to decrease pain; infection; bleeding; organ or nerve damage with  subsequent damage to sensory, motor, and/or autonomic systems, resulting in permanent pain, numbness, and/or weakness of one or several areas of the body; allergic reactions; (i.e.: anaphylactic reaction); and/or death. Furthermore, the patient was informed of those risks and complications associated with  the medications. These include, but are not limited to: allergic reactions (i.e.: anaphylactic or anaphylactoid reaction(s)); adrenal axis suppression; blood sugar elevation that in diabetics may result in ketoacidosis or comma; water retention that in patients with history of congestive heart failure may result in shortness of breath, pulmonary edema, and decompensation with resultant heart failure; weight gain; swelling or edema; medication-induced neural toxicity; particulate matter embolism and blood vessel occlusion with resultant organ, and/or nervous system infarction; and/or aseptic necrosis of one or more joints. Finally, the patient was informed that Medicine is not an exact science; therefore, there is also the possibility of unforeseen or unpredictable risks and/or possible complications that may result in a catastrophic outcome. The patient indicated having understood very clearly. We have given the patient no guarantees and we have made no promises. Enough time was given to the patient to ask questions, all of which were answered to the patient's satisfaction. Adam Petty has indicated that he wanted to continue with the procedure. Attestation: I, the ordering provider, attest that I have discussed with the patient the benefits, risks, side-effects, alternatives, likelihood of achieving goals, and potential problems during recovery for the procedure that I have provided informed consent. Date: 09/08/2016; Time: 8:42 AM  Pre-Procedure Preparation:  Monitoring: As per clinic protocol. Respiration, ETCO2, SpO2, BP, heart rate and rhythm monitor placed and checked for adequate function Safety  Precautions: Patient was assessed for positional comfort and pressure points before starting the procedure. Time-out: I initiated and conducted the "Time-out" before starting the procedure, as per protocol. The patient was asked to participate by confirming the accuracy of the "Time Out" information. Verification of the correct person, site, and procedure were performed and confirmed by me, the nursing staff, and the patient. "Time-out" conducted as per Joint Commission's Universal Protocol (UP.01.01.01). "Time-out" Date & Time: 09/08/2016; 0901 hrs.  Description of Procedure Process:  Position: Prone Target Area: Superior, posterior, aspect of the sacroiliac fissure Approach: Posterior, paraspinal, ipsilateral approach. Area Prepped: Entire Lower Lumbosacral Region Prepping solution: ChloraPrep (2% chlorhexidine gluconate and 70% isopropyl alcohol) Safety Precautions: Aspiration looking for blood return was conducted prior to all injections. At no point did we inject any substances, as a needle was being advanced. No attempts were made at seeking any paresthesias. Safe injection practices and needle disposal techniques used. Medications properly checked for expiration dates. SDV (single dose vial) medications used. Description of the Procedure: Protocol guidelines were followed. The patient was placed in position over the procedure table. The target area was identified and the area prepped in the usual manner. Skin & deeper tissues infiltrated with local anesthetic. Appropriate amount of time allowed to pass for local anesthetics to take effect. The procedure needle was advanced under fluoroscopic guidance into the sacroiliac joint until a firm endpoint was obtained. Proper needle placement secured. Negative aspiration confirmed. Solution injected in intermittent fashion, asking for systemic symptoms every 0.5cc of injectate. The needles were then removed and the area cleansed, making sure to leave some  of the prepping solution back to take advantage of its long term bactericidal properties. Vitals:   09/08/16 0858 09/08/16 0903 09/08/16 0908 09/08/16 0911  BP: (!) 148/95 139/90 (!) 151/102 (!) 142/96  Pulse: 77 78 79 78  Resp: 12 19 18  (!) 22  Temp:      SpO2: 100% 99% 99% 99%  Weight:      Height:        Start Time: 0903 hrs. End Time: 0910 hrs. Materials:  Needle(s) Type:  Regular needle Gauge: 22G Length: 3.5-in Medication(s): We administered methylPREDNISolone acetate, ropivacaine (PF) 2 mg/mL (0.2%), and lidocaine. Please see chart orders for dosing details.  Imaging Guidance (Non-Spinal):  Type of Imaging Technique: Fluoroscopy Guidance (Non-Spinal) Indication(s): Assistance in needle guidance and placement for procedures requiring needle placement in or near specific anatomical locations not easily accessible without such assistance. Exposure Time: Please see nurses notes. Contrast: Before injecting any contrast, we confirmed that the patient did not have an allergy to iodine, shellfish, or radiological contrast. Once satisfactory needle placement was completed at the desired level, radiological contrast was injected. Contrast injected under live fluoroscopy. No contrast complications. See chart for type and volume of contrast used. Fluoroscopic Guidance: I was personally present during the use of fluoroscopy. "Tunnel Vision Technique" used to obtain the best possible view of the target area. Parallax error corrected before commencing the procedure. "Direction-depth-direction" technique used to introduce the needle under continuous pulsed fluoroscopy. Once target was reached, antero-posterior, oblique, and lateral fluoroscopic projection used confirm needle placement in all planes. Images permanently stored in EMR. Interpretation: I personally interpreted the imaging intraoperatively. Adequate needle placement confirmed in multiple planes. Appropriate spread of contrast into desired  area was observed. No evidence of afferent or efferent intravascular uptake. Permanent images saved into the patient's record.  Antibiotic Prophylaxis:  Indication(s): None identified Antibiotic given: None  Post-operative Assessment:  EBL: None Complications: No immediate post-treatment complications observed by team, or reported by patient. Note: The patient tolerated the entire procedure well. A repeat set of vitals were taken after the procedure and the patient was kept under observation following institutional policy, for this type of procedure. Post-procedural neurological assessment was performed, showing return to baseline, prior to discharge. The patient was provided with post-procedure discharge instructions, including a section on how to identify potential problems. Should any problems arise concerning this procedure, the patient was given instructions to immediately contact us, at any time, without hesitation. In any case, we plan to contact the patient by telephone for a follow-up status report regarding this interventional procedure. Comments:  No additional relevant information.  Plan of Care  Disposition: Discharge home  Discharge Date & Time: 09/08/2016; 0916 hrs.  Physician-requested Follow-up:  Return for post-procedure eval by Dr. Laban Emperor in 2 weeks.  Future Appointments Date Time Provider Department Center  10/10/2016 8:15 AM Delano Metz, MD [facility] None  11/22/2016 8:30 AM Barbette Merino, NP [facility] None    Imaging Orders     DG C-Arm 1-60 Min-No Report Procedure Orders    No procedure(s) ordered today    Medications ordered for procedure: Meds ordered this encounter  Medications  . methylPREDNISolone acetate (DEPO-MEDROL) injection 80 mg  . ropivacaine (PF) 2 mg/mL (0.2%) (NAROPIN) injection 9 mL  . lidocaine (XYLOCAINE) 2 % (with pres) injection 200 mg   Medications administered: We administered methylPREDNISolone acetate, ropivacaine (PF) 2  mg/mL (0.2%), and lidocaine.  See the medical record for exact dosing, route, and time of administration.  New Prescriptions   No medications on file   Primary Care Physician: Patient, No Pcp Per Location: [facility] Note by: Oswaldo Done, MD Date: 09/08/2016; Time: 10:48 AM  Disclaimer:  Medicine is not an Visual merchandiser. The only guarantee in medicine is that nothing is guaranteed. It is important to note that the decision to proceed with this intervention was based on the information collected from the patient. The Data and conclusions were drawn from the patient's questionnaire, the interview,  and the physical examination. Because the information was provided in large part by the patient, it cannot be guaranteed that it has not been purposely or unconsciously manipulated. Every effort has been made to obtain as much relevant data as possible for this evaluation. It is important to note that the conclusions that lead to this procedure are derived in large part from the available data. Always take into account that the treatment will also be dependent on availability of resources and existing treatment guidelines, considered by other Pain Management Practitioners as being common knowledge and practice, at the time of the intervention. For Medico-Legal purposes, it is also important to point out that variation in procedural techniques and pharmacological choices are the acceptable norm. The indications, contraindications, technique, and results of the above procedure should only be interpreted and judged by a Board-Certified Interventional Pain Specialist with extensive familiarity and expertise in the same exact procedure and technique.

## 2016-09-08 NOTE — Progress Notes (Signed)
Safety precautions to be maintained throughout the outpatient stay will include: orient to surroundings, keep bed in low position, maintain call bell within reach at all times, provide assistance with transfer out of bed and ambulation.  

## 2016-09-08 NOTE — Patient Instructions (Addendum)
Sacroiliac (SI) Joint Injection Patient Information  Description: The sacroiliac joint connects the scrum (very low back and tailbone) to the ilium (a pelvic bone which also forms half of the hip joint).  Normally this joint experiences very little motion.  When this joint becomes inflamed or unstable low back and or hip and pelvis pain may result.  Injection of this joint with local anesthetics (numbing medicines) and steroids can provide diagnostic information and reduce pain.  This injection is performed with the aid of x-ray guidance into the tailbone area while you are lying on your stomach.   You may experience an electrical sensation down the leg while this is being done.  You may also experience numbness.  We also may ask if we are reproducing your normal pain during the injection.  Conditions which may be treated SI injection:   Low back, buttock, hip or leg pain  Preparation for the Injection:  1. Do not eat any solid food or dairy products within 8 hours of your appointment.  2. You may drink clear liquids up to 3 hours before appointment.  Clear liquids include water, black coffee, juice or soda.  No milk or cream please. 3. You may take your regular medications, including pain medications with a sip of water before your appointment.  Diabetics should hold regular insulin (if take separately) and take 1/2 normal NPH dose the morning of the procedure.  Carry some sugar containing items with you to your appointment. 4. A driver must accompany you and be prepared to drive you home after your procedure. 5. Bring all of your current medications with you. 6. An IV may be inserted and sedation may be given at the discretion of the physician. 7. A blood pressure cuff, EKG and other monitors will often be applied during the procedure.  Some patients may need to have extra oxygen administered for a short period.  8. You will be asked to provide medical information, including your allergies,  prior to the procedure.  We must know immediately if you are taking blood thinners (like Coumadin/Warfarin) or if you are allergic to IV iodine contrast (dye).  We must know if you could possible be pregnant.  Possible side effects:   Bleeding from needle site  Infection (rare, may require surgery)  Nerve injury (rare)  Numbness & tingling (temporary)  A brief convulsion or seizure  Light-headedness (temporary)  Pain at injection site (several days)  Decreased blood pressure (temporary)  Weakness in the leg (temporary)   Call if you experience:   New onset weakness or numbness of an extremity below the injection site that last more than 8 hours.  Hives or difficulty breathing ( go to the emergency room)  Inflammation or drainage at the injection site  Any new symptoms which are concerning to you  Please note:  Although the local anesthetic injected can often make your back/ hip/ buttock/ leg feel good for several hours after the injections, the pain will likely return.  It takes 3-7 days for steroids to work in the sacroiliac area.  You may not notice any pain relief for at least that one week.  If effective, we will often do a series of three injections spaced 3-6 weeks apart to maximally decrease your pain.  After the initial series, we generally will wait some months before a repeat injection of the same type.  If you have any questions, please call [phone] [facility]  Pain Management Discharge   Instructions  General Discharge Instructions :  If you need to reach your doctor call: Monday-Friday 8:00 am - 4:00 pm at [phone] or toll free [phone].  After clinic hours [phone] to have operator reach doctor.  Bring all of your medication bottles to all your appointments in the pain clinic.  To cancel or reschedule your appointment with Pain Management please remember to call 24 hours in advance to avoid a  fee.  Refer to the educational materials which you have been given on: General Risks, I had my Procedure. Discharge Instructions, Post Sedation.  Post Procedure Instructions:  The drugs you were given will stay in your system until tomorrow, so for the next 24 hours you should not drive, make any legal decisions or drink any alcoholic beverages.  You may eat anything you prefer, but it is better to start with liquids then soups and crackers, and gradually work up to solid foods.  Please notify your doctor immediately if you have any unusual bleeding, trouble breathing or pain that is not related to your normal pain.  Depending on the type of procedure that was done, some parts of your body may feel week and/or numb.  This usually clears up by tonight or the next day.  Walk with the use of an assistive device or accompanied by an adult for the 24 hours.  You may use ice on the affected area for the first 24 hours.  Put ice in a Ziploc bag and cover with a towel and place against area 15 minutes on 15 minutes off.  You may switch to heat after 24 hours. 

## 2016-09-09 ENCOUNTER — Telehealth: Payer: Self-pay

## 2016-09-09 NOTE — Telephone Encounter (Signed)
Post procedure phone call.  Left message.  

## 2016-10-09 NOTE — Progress Notes (Signed)
Patient's Name: Adam Petty.  MRN: [id number]  Referring Provider: No ref. provider found  DOB: 09/11/1960  PCP: Patient, No Pcp Per  DOS: 10/10/2016  Note by: Gaspar Cola, MD  Service setting: Ambulatory outpatient  Specialty: Interventional Pain Management  Location: [facility] (AMB) Pain Management Facility    Patient type: Established   Primary Reason(s) for Visit: Encounter for post-procedure evaluation of chronic illness with mild to moderate exacerbation CC: Back Pain (lower bilateral)  HPI  Adam Petty is a 56 y.o. year old, male patient, who comes today for a post-procedure evaluation. 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 His primarily concern today is the Back Pain (lower bilateral)  Pain Assessment: Location: Lower, Left, Right Back Radiating: down the right leg to approx the foot Onset: More than a month ago Duration: Chronic pain Quality: Constant, Sharp Severity: 4 /10 (self-reported pain score)  Note: Reported level is inconsistent with clinical observations. Clinically the patient looks like a 1/10       Pain level appears to be "Mild", defined here as nagging, annoying, but does not interfere with basic activities of daily living (ADL). Adam Petty is able to eat, bathe, get dressed, do toileting (being able to get on and off the toilet and perform personal hygiene functions), transfer (move in and out of bed or a chair without assistance), and maintain continence (able to control bladder and bowel functions). Physiologic parameters such as blood pressure and heart rate apear wnl. Effect on ADL: paces self and push through Timing: Constant Modifying factors: procedure, heat and lying down  Adam Petty comes in today for post-procedure evaluation after the treatment done on 09/08/2016.  Further details on both, my assessment(s), as well as the proposed treatment plan, please see below.  Post-Procedure Assessment  09/08/2016 Procedure: Diagnostic bilateral sacroiliac joint injection #1 under fluoroscopy and IV sedation Pre-procedure pain score:  5/10 Post-procedure pain score: 1/10         Influential Factors: BMI: 38.95 kg/m Intra-procedural challenges: None observed.         Assessment challenges: None detected.              Reported side-effects: None.        Post-procedural adverse reactions or complications: None reported         Sedation: Sedation provided. When no sedatives are used, the analgesic levels obtained  are directly associated to the  effectiveness of the local anesthetics. However, when sedation is provided, the level of analgesia obtained during the initial 1 hour following the intervention, is believed to be the result of a combination of factors. These factors may include, but are not limited to: 1. The effectiveness of the local anesthetics used. 2. The effects of the analgesic(s) and/or anxiolytic(s) used. 3. The degree of discomfort experienced by the patient at the time of the procedure. 4. The patients ability and reliability in recalling and recording the events. 5. The presence and influence of possible secondary gains and/or psychosocial factors. Reported result: Relief experienced during the 1st hour after the procedure: 90 % (Ultra-Short Term Relief)            Interpretative annotation: Clinically appropriate result. Analgesia during this period is likely to be Local Anesthetic and/or IV Sedative (Analgesic/Anxiolytic) related.          Effects of local anesthetic: The analgesic effects attained during this period are directly associated to the localized infiltration of local anesthetics and therefore cary significant diagnostic value as to the etiological location, or anatomical origin, of the pain. Expected duration of relief is directly dependent on the pharmacodynamics of the local anesthetic used. Long-acting (4-6 hours) anesthetics used.  Reported result: Relief during the next 4 to 6 hour after the procedure: 90 % (Short-Term Relief)            Interpretative annotation: Clinically appropriate result. Analgesia during this period is likely to be Local Anesthetic-related.          Long-term benefit: Defined as the period of time past the expected duration of local anesthetics (1 hour for short-acting and 4-6 hours for long-acting). With the possible exception of prolonged sympathetic blockade from the local anesthetics, benefits during this period are typically attributed to, or associated with, other factors  such as analgesic sensory neuropraxia, antiinflammatory effects, or beneficial biochemical changes provided by agents other than the local anesthetics.  Reported result: Extended relief following procedure: 60 % (pain is still there but is less in intensity) (Long-Term Relief) Mr. Saadeh reports the axial pain improved more than the extremity pain. Interpretative annotation: Clinically appropriate result. Good relief. No permanent benefit expected. Inflammation plays a part in the etiology to the pain.          Current benefits: Defined as reported results that persistent at this point in time.   Analgesia: >50 % Mr. Janczak reports the axial pain improved more than the extremity pain. Function: Mr. Lafavor reports improvement in function ROM: Mr. Genet reports improvement in ROM Interpretative annotation: Recurrence of symptoms. No permanent benefit expected. Effective therapeutic approach.          Interpretation: Results would suggest a successful diagnostic intervention.                  Plan:  Consider diagnostic procedure # 2.       Laboratory Chemistry  Inflammation Markers (CRP: Acute Phase) (ESR: Chronic Phase) Lab Results  Component Value Date   CRP 0.6 03/12/2015   ESRSEDRATE 9 03/12/2015                 Renal Function Markers Lab Results  Component Value Date   BUN 15 03/12/2015   CREATININE 0.82 03/12/2015   GFRAA >60 03/12/2015   GFRNONAA >60 03/12/2015                 Hepatic Function Markers Lab Results  Component Value Date   AST 41 03/12/2015   ALT 55 03/12/2015   ALBUMIN 4.2 03/12/2015   ALKPHOS 89 03/12/2015                 Electrolytes Lab Results  Component Value Date   NA 140 03/12/2015   K 4.2 03/12/2015   CL 105 03/12/2015   CALCIUM 10.0 03/12/2015   MG 1.8 03/12/2015                 Neuropathy Markers No results found for: [id number]               Bone Pathology Markers Lab Results  Component Value Date   ALKPHOS 89 03/12/2015    CALCIUM 10.0 03/12/2015                 Coagulation Parameters Lab Results  Component Value Date   PLT 176 03/13/2015                 Cardiovascular Markers Lab Results  Component Value Date   HGB 15.3 03/13/2015   HCT 46.4 03/13/2015                 Note: Lab results reviewed.  Recent Diagnostic Imaging Results  DG C-Arm 1-60 Min-No Report Fluoroscopy was utilized by the requesting physician.  No radiographic  interpretation.   Note: Imaging results reviewed.          Meds   Current Outpatient Prescriptions:  .  amoxicillin-clavulanate (AUGMENTIN) 500-125 MG tablet, Take 1 tablet by mouth 2 (two) times daily., Disp: , Rfl:  .  cyanocobalamin (,VITAMIN B-12,) 1000 MCG/ML injection, Inject into the muscle every 30 (thirty) days. , Disp: , Rfl:  .  gabapentin (NEURONTIN) 600 MG tablet, Take 1 tablet (600 mg total) by mouth every 6 (six) hours., Disp: 360 tablet, Rfl: 0 .  insulin degludec (TRESIBA FLEXTOUCH) 100 UNIT/ML SOPN FlexTouch Pen, Inject 40 Units into the skin daily. , Disp: , Rfl:  .  ketoconazole (NIZORAL) 2 % shampoo, APPLY TO AFFECTED AREA EVERY DAY AS NEEDED, Disp: , Rfl: 4 .  liraglutide (VICTOZA) 18 MG/3ML SOPN, INJECT 0.3 MLS (1.8 MG TOTAL) SUBCUTANEOUSLY ONCE DAILY., Disp: , Rfl:  .  Liraglutide -Weight Management (SAXENDA) 18 MG/3ML SOPN, Inject into the skin., Disp: , Rfl:  .  metFORMIN (GLUCOPHAGE) 1000 MG tablet, TAKE 1 TABLET (1,000 MG TOTAL) BY MOUTH 2 (TWO) TIMES DAILY WITH MEALS., Disp: , Rfl: 1 .  NOVOLOG FLEXPEN 100 UNIT/ML FlexPen, Inject 0-10 Units into the skin daily as needed for high blood sugar (sliding scale). Reported on 02/24/2015, Disp: , Rfl: 0 .  oxyCODONE (OXY IR/ROXICODONE) 5 MG immediate release tablet, Take 1 tablet (5 mg total) by mouth every 6 (six) hours as needed for severe pain., Disp: 120 tablet, Rfl: 0 .  [START ON 11/08/2016] oxyCODONE (OXY IR/ROXICODONE) 5 MG immediate release tablet, Take 1 tablet (5 mg total) by mouth every 6  (six) hours as needed for severe pain., Disp: 120 tablet, Rfl: 0 .  potassium citrate (UROCIT-K) 10 MEQ (1080 MG) SR tablet, Take 10 mEq by mouth 2 (two) times daily., Disp: , Rfl:  .  ramipril (ALTACE) 5 MG capsule, Take 5 mg by mouth every morning. , Disp: , Rfl:  .  rosuvastatin (CRESTOR) 5 MG tablet, Take 5 mg by mouth 3 (three) times a week. Monday Wednesday Friday, Disp: , Rfl:  .  tapentadol (NUCYNTA) 50 MG tablet, Take 50 mg by  mouth as needed. For kidney stones, Disp: , Rfl:  .  oxyCODONE (OXY IR/ROXICODONE) 5 MG immediate release tablet, Take 1 tablet (5 mg total) by mouth every 6 (six) hours as needed for severe pain., Disp: 120 tablet, Rfl: 0  ROS  Constitutional: Denies any fever or chills Gastrointestinal: No reported hemesis, hematochezia, vomiting, or acute GI distress Musculoskeletal: Denies any acute onset joint swelling, redness, loss of ROM, or weakness Neurological: No reported episodes of acute onset apraxia, aphasia, dysarthria, agnosia, amnesia, paralysis, loss of coordination, or loss of consciousness  Allergies  Mr. Camps has No Known Allergies.  [place]  Drug: Mr. Doublin  reports that he does not use drugs. Alcohol:  reports that he drinks alcohol. Tobacco:  reports that he has never smoked. He has never used smokeless tobacco. Medical:  has a past medical history of Arthritis, senescent (10/22/2014); Back pain; Chronic pain syndrome (10/22/2014); Diabetes mellitus without complication ([place]); History of kidney stones; IBS (irritable bowel syndrome); Lateral meniscus tear; Preventative health care; Sleep apnea; and Spinal stenosis. Surgical: Mr. Gauthier  has a past surgical history that includes Tonsillectomy; Shoulder surgery; Palate surgery; Cervical fusion; Foot surgery; Lumbar laminectomy/decompression microdiscectomy (N/A, 02/06/2013); Knee arthroscopy (Right, 10/02/2013); Knee arthroscopy with medial menisectomy (Right, 03/25/2015); Knee bursectomy (Right, 03/25/2015);  and Cystoscopy w/ retrogrades (Right, 11/19/2015). Family: family history includes COPD in his mother; Cancer in his mother; Diabetes in his father; Heart disease in his father and mother.  Constitutional Exam  General appearance: Well nourished, well developed, and well hydrated. In no apparent acute distress Vitals:   10/10/16 0837  BP: 135/84  Pulse: 86  Resp: 16  Temp: 98.4 F (36.9 C)  TempSrc: Oral  SpO2: 97%  Weight: 245 lb (111.1 kg)  Height: 5' 6.5" (1.689 m)   BMI Assessment: Estimated body mass index is 38.95 kg/m as calculated from the following:   Height as of this encounter: 5' 6.5" (1.689 m).   Weight as of this encounter: 245 lb (111.1 kg).  BMI interpretation table: BMI level Category Range association with higher incidence of chronic pain  <18 kg/m2 Underweight   18.5-24.9 kg/m2 Ideal body weight   25-29.9 kg/m2 Overweight Increased incidence by 20%  30-34.9 kg/m2 Obese (Class I) Increased incidence by 68%  35-39.9 kg/m2 Severe obesity (Class II) Increased incidence by 136%  >40 kg/m2 Extreme obesity (Class III) Increased incidence by 254%   BMI Readings from Last 4 Encounters:  10/10/16 38.95 kg/m  09/08/16 40.35 kg/m  08/25/16 41.16 kg/m  07/12/16 39.54 kg/m   Wt Readings from Last 4 Encounters:  10/10/16 245 lb (111.1 kg)  09/08/16 250 lb (113.4 kg)  08/25/16 255 lb (115.7 kg)  07/12/16 245 lb (111.1 kg)  Psych/Mental status: Alert, oriented x 3 (person, place, & time)       Eyes: PERLA Respiratory: No evidence of acute respiratory distress  Cervical Spine Area Exam  Skin & Axial Inspection: No masses, redness, edema, swelling, or associated skin lesions Alignment: Symmetrical Functional ROM: Unrestricted ROM      Stability: No instability detected Muscle Tone/Strength: Functionally intact. No obvious neuro-muscular anomalies detected. Sensory (Neurological): Unimpaired Palpation: No palpable anomalies              Upper Extremity (UE)  Exam    Side: Right upper extremity  Side: Left upper extremity  Skin & Extremity Inspection: Skin color, temperature, and hair growth are WNL. No peripheral edema or cyanosis. No masses, redness, swelling, asymmetry, or associated skin lesions. No  contractures.  Skin & Extremity Inspection: Skin color, temperature, and hair growth are WNL. No peripheral edema or cyanosis. No masses, redness, swelling, asymmetry, or associated skin lesions. No contractures.  Functional ROM: Unrestricted ROM          Functional ROM: Unrestricted ROM          Muscle Tone/Strength: Functionally intact. No obvious neuro-muscular anomalies detected.  Muscle Tone/Strength: Functionally intact. No obvious neuro-muscular anomalies detected.  Sensory (Neurological): Unimpaired          Sensory (Neurological): Unimpaired          Palpation: No palpable anomalies              Palpation: No palpable anomalies              Specialized Test(s): Deferred         Specialized Test(s): Deferred          Thoracic Spine Area Exam  Skin & Axial Inspection: No masses, redness, or swelling Alignment: Symmetrical Functional ROM: Unrestricted ROM Stability: No instability detected Muscle Tone/Strength: Functionally intact. No obvious neuro-muscular anomalies detected. Sensory (Neurological): Unimpaired Muscle strength & Tone: No palpable anomalies  Lumbar Spine Area Exam  Skin & Axial Inspection: No masses, redness, or swelling Alignment: Symmetrical Functional ROM: Improved after treatment      Stability: No instability detected Muscle Tone/Strength: Functionally intact. No obvious neuro-muscular anomalies detected. Sensory (Neurological): Unimpaired Palpation: No palpable anomalies       Provocative Tests: Lumbar Hyperextension and rotation test: evaluation deferred today       Lumbar Lateral bending test: evaluation deferred today       Patrick's Maneuver: evaluation deferred today                    Gait & Posture  Assessment  Ambulation: Unassisted Gait: Relatively normal for age and body habitus Posture: WNL   Lower Extremity Exam    Side: Right lower extremity  Side: Left lower extremity  Skin & Extremity Inspection: Skin color, temperature, and hair growth are WNL. No peripheral edema or cyanosis. No masses, redness, swelling, asymmetry, or associated skin lesions. No contractures.  Skin & Extremity Inspection: Skin color, temperature, and hair growth are WNL. No peripheral edema or cyanosis. No masses, redness, swelling, asymmetry, or associated skin lesions. No contractures.  Functional ROM: Unrestricted ROM          Functional ROM: Unrestricted ROM          Muscle Tone/Strength: Functionally intact. No obvious neuro-muscular anomalies detected.  Muscle Tone/Strength: Functionally intact. No obvious neuro-muscular anomalies detected.  Sensory (Neurological): Unimpaired  Sensory (Neurological): Unimpaired  Palpation: No palpable anomalies  Palpation: No palpable anomalies   Assessment  Primary Diagnosis & Pertinent Problem List: The primary encounter diagnosis was Chronic sacroiliac joint pain (Bilateral) (R>L). Diagnoses of Chronic low back pain (WC injury) (Primary Source of Pain) (Bilateral) (R>L), Chronic knee pain (Secondary source of pain) (Right), Chronic shoulder pain (Tertiary source of pain) (Right), and Chronic pain syndrome (WC injury) were also pertinent to this visit.  Status Diagnosis  Improving Improving Controlled 1. Chronic sacroiliac joint pain (Bilateral) (R>L)   2. Chronic low back pain (WC injury) (Primary Source of Pain) (Bilateral) (R>L)   3. Chronic knee pain (Secondary source of pain) (Right)   4. Chronic shoulder pain [facility] source of pain) (Right)   5. Chronic pain syndrome (WC injury)     Problems updated and reviewed during this visit: No problems updated.  Plan of Care  Pharmacotherapy (Medications Ordered): No orders of the defined types were placed in this  encounter.  New Prescriptions   No medications on file   Medications administered today: Mr. Tollison had no medications administered during this visit.   Procedure Orders     SACROILIAC JOINT INJECTINS Lab Orders  No laboratory test(s) ordered today   Imaging Orders  No imaging studies ordered today   Referral Orders  No referral(s) requested today    Interventional management options: Planned, scheduled, and/or pending:    Diagnostic bilateral sacroiliac joint injection #2 under fluoroscopy and IV sedation   Considering:   Palliativebilateral lumbar facet block  Palliativebilateral lumbar facet radiofrequency ablation (Right side done 04/13/2016) (100% relief) Diagnostic right knee genicular nerve block .  Possible right knee genicular nerve radiofrequency ablation   Palliative PRN treatment(s):   Diagnostic Right Suprascapular nerve Block #2 Possible Right Suprascapular nerve RFA    Provider-requested follow-up: Return for Procedure (with sedation): (B) S-I Blk.  Future Appointments Date Time Provider [place]  11/22/2016 8:30 AM Vevelyn Francois, NP [facility] None   Primary Care Physician: Patient, No Pcp Per Location: [facility] Facility Note by: Gaspar Cola, MD Date: 10/10/2016; Time: 9:06 AM

## 2016-10-10 ENCOUNTER — Ambulatory Visit: Payer: BLUE CROSS/BLUE SHIELD | Attending: Pain Medicine | Admitting: Pain Medicine

## 2016-10-10 ENCOUNTER — Encounter: Payer: Self-pay | Admitting: Pain Medicine

## 2016-10-10 VITALS — BP 135/84 | HR 86 | Temp 98.4°F | Resp 16 | Ht 66.5 in | Wt 245.0 lb

## 2016-10-10 DIAGNOSIS — T402X5A Adverse effect of other opioids, initial encounter: Secondary | ICD-10-CM | POA: Insufficient documentation

## 2016-10-10 DIAGNOSIS — Z833 Family history of diabetes mellitus: Secondary | ICD-10-CM | POA: Diagnosis not present

## 2016-10-10 DIAGNOSIS — Z8249 Family history of ischemic heart disease and other diseases of the circulatory system: Secondary | ICD-10-CM | POA: Insufficient documentation

## 2016-10-10 DIAGNOSIS — E785 Hyperlipidemia, unspecified: Secondary | ICD-10-CM | POA: Insufficient documentation

## 2016-10-10 DIAGNOSIS — M545 Low back pain: Secondary | ICD-10-CM

## 2016-10-10 DIAGNOSIS — M1990 Unspecified osteoarthritis, unspecified site: Secondary | ICD-10-CM | POA: Insufficient documentation

## 2016-10-10 DIAGNOSIS — K5903 Drug induced constipation: Secondary | ICD-10-CM | POA: Diagnosis not present

## 2016-10-10 DIAGNOSIS — M4726 Other spondylosis with radiculopathy, lumbar region: Secondary | ICD-10-CM | POA: Diagnosis not present

## 2016-10-10 DIAGNOSIS — Z5181 Encounter for therapeutic drug level monitoring: Secondary | ICD-10-CM | POA: Insufficient documentation

## 2016-10-10 DIAGNOSIS — M1711 Unilateral primary osteoarthritis, right knee: Secondary | ICD-10-CM | POA: Diagnosis not present

## 2016-10-10 DIAGNOSIS — X58XXXA Exposure to other specified factors, initial encounter: Secondary | ICD-10-CM | POA: Insufficient documentation

## 2016-10-10 DIAGNOSIS — Z981 Arthrodesis status: Secondary | ICD-10-CM | POA: Diagnosis not present

## 2016-10-10 DIAGNOSIS — Z825 Family history of asthma and other chronic lower respiratory diseases: Secondary | ICD-10-CM | POA: Diagnosis not present

## 2016-10-10 DIAGNOSIS — Z6838 Body mass index (BMI) 38.0-38.9, adult: Secondary | ICD-10-CM | POA: Insufficient documentation

## 2016-10-10 DIAGNOSIS — Z87442 Personal history of urinary calculi: Secondary | ICD-10-CM | POA: Insufficient documentation

## 2016-10-10 DIAGNOSIS — Z794 Long term (current) use of insulin: Secondary | ICD-10-CM | POA: Insufficient documentation

## 2016-10-10 DIAGNOSIS — Z79899 Other long term (current) drug therapy: Secondary | ICD-10-CM | POA: Diagnosis not present

## 2016-10-10 DIAGNOSIS — M791 Myalgia: Secondary | ICD-10-CM | POA: Insufficient documentation

## 2016-10-10 DIAGNOSIS — G894 Chronic pain syndrome: Secondary | ICD-10-CM | POA: Insufficient documentation

## 2016-10-10 DIAGNOSIS — M25561 Pain in right knee: Secondary | ICD-10-CM

## 2016-10-10 DIAGNOSIS — M533 Sacrococcygeal disorders, not elsewhere classified: Secondary | ICD-10-CM | POA: Insufficient documentation

## 2016-10-10 DIAGNOSIS — M25511 Pain in right shoulder: Secondary | ICD-10-CM | POA: Insufficient documentation

## 2016-10-10 DIAGNOSIS — G8929 Other chronic pain: Secondary | ICD-10-CM

## 2016-10-10 DIAGNOSIS — M19011 Primary osteoarthritis, right shoulder: Secondary | ICD-10-CM | POA: Insufficient documentation

## 2016-10-10 DIAGNOSIS — G4730 Sleep apnea, unspecified: Secondary | ICD-10-CM | POA: Insufficient documentation

## 2016-10-10 DIAGNOSIS — M488X6 Other specified spondylopathies, lumbar region: Secondary | ICD-10-CM | POA: Diagnosis not present

## 2016-10-10 DIAGNOSIS — M48061 Spinal stenosis, lumbar region without neurogenic claudication: Secondary | ICD-10-CM | POA: Insufficient documentation

## 2016-10-10 DIAGNOSIS — M4800 Spinal stenosis, site unspecified: Secondary | ICD-10-CM | POA: Insufficient documentation

## 2016-10-10 DIAGNOSIS — S83289A Other tear of lateral meniscus, current injury, unspecified knee, initial encounter: Secondary | ICD-10-CM | POA: Insufficient documentation

## 2016-10-10 DIAGNOSIS — E119 Type 2 diabetes mellitus without complications: Secondary | ICD-10-CM | POA: Diagnosis not present

## 2016-10-10 NOTE — Progress Notes (Signed)
Safety precautions to be maintained throughout the outpatient stay will include: orient to surroundings, keep bed in low position, maintain call bell within reach at all times, provide assistance with transfer out of bed and ambulation.  

## 2016-10-10 NOTE — Patient Instructions (Addendum)
____________________________________________________________________________________________  Preparing for Procedure with Sedation Instructions: . Oral Intake: Do not eat or drink anything for at least 8 hours prior to your procedure. . Transportation: Public transportation is not allowed. Bring an adult driver. The driver must be physically present in our waiting room before any procedure can be started. . Physical Assistance: Bring an adult physically capable of assisting you, in the event you need help. This adult should keep you company at home for at least 6 hours after the procedure. . Blood Pressure Medicine: Take your blood pressure medicine with a sip of water the morning of the procedure. . Blood thinners:  . Diabetics on insulin: Notify the staff so that you can be scheduled 1st case in the morning. If your diabetes requires high dose insulin, take only  of your normal insulin dose the morning of the procedure and notify the staff that you have done so. . Preventing infections: Shower with an antibacterial soap the morning of your procedure. . Build-up your immune system: Take 1000 mg of Vitamin C with every meal (3 times a day) the day prior to your procedure. . Antibiotics: Inform the staff if you have a condition or reason that requires you to take antibiotics before dental procedures. . Pregnancy: If you are pregnant, call and cancel the procedure. . Sickness: If you have a cold, fever, or any active infections, call and cancel the procedure. . Arrival: You must be in the facility at least 30 minutes prior to your scheduled procedure. . Children: Do not bring children with you. . Dress appropriately: Bring dark clothing that you would not mind if they get stained. . Valuables: Do not bring any jewelry or valuables. Procedure appointments are reserved for interventional treatments only. . No Prescription Refills. . No medication changes will be discussed during procedure  appointments. . No disability issues will be discussed. ____________________________________________________________________________________________  GENERAL RISKS AND COMPLICATIONS  What are the risk, side effects and possible complications? Generally speaking, most procedures are safe.  However, with any procedure there are risks, side effects, and the possibility of complications.  The risks and complications are dependent upon the sites that are lesioned, or the type of nerve block to be performed.  The closer the procedure is to the spine, the more serious the risks are.  Great care is taken when placing the radio frequency needles, block needles or lesioning probes, but sometimes complications can occur. 1. Infection: Any time there is an injection through the skin, there is a risk of infection.  This is why sterile conditions are used for these blocks.  There are four possible types of infection. 1. Localized skin infection. 2. Central Nervous System Infection-This can be in the form of Meningitis, which can be deadly. 3. Epidural Infections-This can be in the form of an epidural abscess, which can cause pressure inside of the spine, causing compression of the spinal cord with subsequent paralysis. This would require an emergency surgery to decompress, and there are no guarantees that the patient would recover from the paralysis. 4. Discitis-This is an infection of the intervertebral discs.  It occurs in about 1% of discography procedures.  It is difficult to treat and it may lead to surgery.        2. Pain: the needles have to go through skin and soft tissues, will cause soreness.       3. Damage to internal structures:  The nerves to be lesioned may be near blood vessels or      other nerves which can be potentially damaged.       4. Bleeding: Bleeding is more common if the patient is taking blood thinners such as  aspirin, Coumadin, Ticiid, Plavix, etc., or if he/she have some genetic  predisposition  such as hemophilia. Bleeding into the spinal canal can cause compression of the spinal  cord with subsequent paralysis.  This would require an emergency surgery to  decompress and there are no guarantees that the patient would recover from the  paralysis.       5. Pneumothorax:  Puncturing of a lung is a possibility, every time a needle is introduced in  the area of the chest or upper back.  Pneumothorax refers to free air around the  collapsed lung(s), inside of the thoracic cavity (chest cavity).  Another two possible  complications related to a similar event would include: Hemothorax and Chylothorax.   These are variations of the Pneumothorax, where instead of air around the collapsed  lung(s), you may have blood or chyle, respectively.       6. Spinal headaches: They may occur with any procedures in the area of the spine.       7. Persistent CSF (Cerebro-Spinal Fluid) leakage: This is a rare problem, but may occur  with prolonged intrathecal or epidural catheters either due to the formation of a fistulous  track or a dural tear.       8. Nerve damage: By working so close to the spinal cord, there is always a possibility of  nerve damage, which could be as serious as a permanent spinal cord injury with  paralysis.       9. Death:  Although rare, severe deadly allergic reactions known as "Anaphylactic  reaction" can occur to any of the medications used.      10. Worsening of the symptoms:  We can always make thing worse.  What are the chances of something like this happening? Chances of any of this occuring are extremely low.  By statistics, you have more of a chance of getting killed in a motor vehicle accident: while driving to the hospital than any of the above occurring .  Nevertheless, you should be aware that they are possibilities.  In general, it is similar to taking a shower.  Everybody knows that you can slip, hit your head and get killed.  Does that mean that you should not  shower again?  Nevertheless always keep in mind that statistics do not mean anything if you happen to be on the wrong side of them.  Even if a procedure has a 1 (one) in a 1,000,000 (million) chance of going wrong, it you happen to be that one..Also, keep in mind that by statistics, you have more of a chance of having something go wrong when taking medications.  Who should not have this procedure? If you are on a blood thinning medication (e.g. Coumadin, Plavix, see list of "Blood Thinners"), or if you have an active infection going on, you should not have the procedure.  If you are taking any blood thinners, please inform your physician.  How should I prepare for this procedure?  Do not eat or drink anything at least six hours prior to the procedure.  Bring a driver with you .  It cannot be a taxi.  Come accompanied by an adult that can drive you back, and that is strong enough to help you if your legs get weak or numb from the local anesthetic.  Take   all of your medicines the morning of the procedure with just enough water to swallow them.  If you have diabetes, make sure that you are scheduled to have your procedure done first thing in the morning, whenever possible.  If you have diabetes, take only half of your insulin dose and notify our nurse that you have done so as soon as you arrive at the clinic.  If you are diabetic, but only take blood sugar pills (oral hypoglycemic), then do not take them on the morning of your procedure.  You may take them after you have had the procedure.  Do not take aspirin or any aspirin-containing medications, at least eleven (11) days prior to the procedure.  They may prolong bleeding.  Wear loose fitting clothing that may be easy to take off and that you would not mind if it got stained with Betadine or blood.  Do not wear any jewelry or perfume  Remove any nail coloring.  It will interfere with some of our monitoring equipment.  NOTE: Remember that  this is not meant to be interpreted as a complete list of all possible complications.  Unforeseen problems may occur.  BLOOD THINNERS The following drugs contain aspirin or other products, which can cause increased bleeding during surgery and should not be taken for 2 weeks prior to and 1 week after surgery.  If you should need take something for relief of minor pain, you may take acetaminophen which is found in Tylenol,m Datril, Anacin-3 and Panadol. It is not blood thinner. The products listed below are.  Do not take any of the products listed below in addition to any listed on your instruction sheet.  A.P.C or A.P.C with Codeine Codeine Phosphate Capsules #3 Ibuprofen Ridaura  ABC compound Congesprin Imuran rimadil  Advil Cope Indocin Robaxisal  Alka-Seltzer Effervescent Pain Reliever and Antacid Coricidin or Coricidin-D  Indomethacin Rufen  Alka-Seltzer plus Cold Medicine Cosprin Ketoprofen S-A-C Tablets  Anacin Analgesic Tablets or Capsules Coumadin Korlgesic Salflex  Anacin Extra Strength Analgesic tablets or capsules CP-2 Tablets Lanoril Salicylate  Anaprox Cuprimine Capsules Levenox Salocol  Anexsia-D Dalteparin Magan Salsalate  Anodynos Darvon compound Magnesium Salicylate Sine-off  Ansaid Dasin Capsules Magsal Sodium Salicylate  Anturane Depen Capsules Marnal Soma  APF Arthritis pain formula Dewitt's Pills Measurin Stanback  Argesic Dia-Gesic Meclofenamic Sulfinpyrazone  Arthritis Bayer Timed Release Aspirin Diclofenac Meclomen Sulindac  Arthritis pain formula Anacin Dicumarol Medipren Supac  Analgesic (Safety coated) Arthralgen Diffunasal Mefanamic Suprofen  Arthritis Strength Bufferin Dihydrocodeine Mepro Compound Suprol  Arthropan liquid Dopirydamole Methcarbomol with Aspirin Synalgos  ASA tablets/Enseals Disalcid Micrainin Tagament  Ascriptin Doan's Midol Talwin  Ascriptin A/D Dolene Mobidin Tanderil  Ascriptin Extra Strength Dolobid Moblgesic Ticlid  Ascriptin with Codeine  Doloprin or Doloprin with Codeine Momentum Tolectin  Asperbuf Duoprin Mono-gesic Trendar  Aspergum Duradyne Motrin or Motrin IB Triminicin  Aspirin plain, buffered or enteric coated Durasal Myochrisine Trigesic  Aspirin Suppositories Easprin Nalfon Trillsate  Aspirin with Codeine Ecotrin Regular or Extra Strength Naprosyn Uracel  Atromid-S Efficin Naproxen Ursinus  Auranofin Capsules Elmiron Neocylate Vanquish  Axotal Emagrin Norgesic Verin  Azathioprine Empirin or Empirin with Codeine Normiflo Vitamin E  Azolid Emprazil Nuprin Voltaren  Bayer Aspirin plain, buffered or children's or timed BC Tablets or powders Encaprin Orgaran Warfarin Sodium  Buff-a-Comp Enoxaparin Orudis Zorpin  Buff-a-Comp with Codeine Equegesic Os-Cal-Gesic   Buffaprin Excedrin plain, buffered or Extra Strength Oxalid   Bufferin Arthritis Strength Feldene Oxphenbutazone   Bufferin plain or Extra Strength Feldene Capsules   Oxycodone with Aspirin   Bufferin with Codeine Fenoprofen Fenoprofen Pabalate or Pabalate-SF   Buffets II Flogesic Panagesic   Buffinol plain or Extra Strength Florinal or Florinal with Codeine Panwarfarin   Buf-Tabs Flurbiprofen Penicillamine   Butalbital Compound Four-way cold tablets Penicillin   Butazolidin Fragmin Pepto-Bismol   Carbenicillin Geminisyn Percodan   Carna Arthritis Reliever Geopen Persantine   Carprofen Gold's salt Persistin   Chloramphenicol Goody's Phenylbutazone   Chloromycetin Haltrain Piroxlcam   Clmetidine heparin Plaquenil   Cllnoril Hyco-pap Ponstel   Clofibrate Hydroxy chloroquine Propoxyphen         Before stopping any of these medications, be sure to consult the physician who ordered them.  Some, such as Coumadin (Warfarin) are ordered to prevent or treat serious conditions such as "deep thrombosis", "pumonary embolisms", and other heart problems.  The amount of time that you may need off of the medication may also vary with the medication and the reason for which  you were taking it.  If you are taking any of these medications, please make sure you notify your pain physician before you undergo any procedures.         Sacroiliac (SI) Joint Injection Patient Information  Description: The sacroiliac joint connects the scrum (very low back and tailbone) to the ilium (a pelvic bone which also forms half of the hip joint).  Normally this joint experiences very little motion.  When this joint becomes inflamed or unstable low back and or hip and pelvis pain may result.  Injection of this joint with local anesthetics (numbing medicines) and steroids can provide diagnostic information and reduce pain.  This injection is performed with the aid of x-ray guidance into the tailbone area while you are lying on your stomach.   You may experience an electrical sensation down the leg while this is being done.  You may also experience numbness.  We also may ask if we are reproducing your normal pain during the injection.  Conditions which may be treated SI injection:   Low back, buttock, hip or leg pain  Preparation for the Injection:  1. Do not eat any solid food or dairy products within 8 hours of your appointment.  2. You may drink clear liquids up to 3 hours before appointment.  Clear liquids include water, black coffee, juice or soda.  No milk or cream please. 3. You may take your regular medications, including pain medications with a sip of water before your appointment.  Diabetics should hold regular insulin (if take separately) and take 1/2 normal NPH dose the morning of the procedure.  Carry some sugar containing items with you to your appointment. 4. A driver must accompany you and be prepared to drive you home after your procedure. 5. Bring all of your current medications with you. 6. An IV may be inserted and sedation may be given at the discretion of the physician. 7. A blood pressure cuff, EKG and other monitors will often be applied during the  procedure.  Some patients may need to have extra oxygen administered for a short period.  8. You will be asked to provide medical information, including your allergies, prior to the procedure.  We must know immediately if you are taking blood thinners (like Coumadin/Warfarin) or if you are allergic to IV iodine contrast (dye).  We must know if you could possible be pregnant.  Possible side effects:   Bleeding from needle site  Infection (rare, may require surgery)  Nerve injury (rare)  Numbness &   tingling (temporary)  A brief convulsion or seizure  Light-headedness (temporary)  Pain at injection site (several days)  Decreased blood pressure (temporary)  Weakness in the leg (temporary)   Call if you experience:   New onset weakness or numbness of an extremity below the injection site that last more than 8 hours.  Hives or difficulty breathing ( go to the emergency room)  Inflammation or drainage at the injection site  Any new symptoms which are concerning to you  Please note:  Although the local anesthetic injected can often make your back/ hip/ buttock/ leg feel good for several hours after the injections, the pain will likely return.  It takes 3-7 days for steroids to work in the sacroiliac area.  You may not notice any pain relief for at least that one week.  If effective, we will often do a series of three injections spaced 3-6 weeks apart to maximally decrease your pain.  After the initial series, we generally will wait some months before a repeat injection of the same type.  If you have any questions, please call [phone] [facility]   

## 2016-11-15 ENCOUNTER — Ambulatory Visit: Payer: BLUE CROSS/BLUE SHIELD | Admitting: Pain Medicine

## 2016-11-16 NOTE — Progress Notes (Signed)
Patient's Name: Adam Petty.  MRN: [id number]  Referring Provider: No ref. provider found  DOB: April 15, 1960  PCP: Patient, No Pcp Per  DOS: 11/17/2016  Note by: Oswaldo Done, MD  Service setting: Ambulatory outpatient  Specialty: Interventional Pain Management  Patient type: Established  Location: [facility] (AMB) Pain Management Facility  Visit type: Interventional Procedure   Primary Reason for Visit: Interventional Pain Management Treatment. CC: Back Pain (lower)  Procedure:  Anesthesia, Analgesia, Anxiolysis:  Type: Diagnostic Sacroiliac Joint Steroid Injection #2 Region: Superior Lumbosacral Region Level: PSIS (Posterior Superior Iliac Spine) Laterality: Bilateral  Type: Local Anesthesia with Moderate (Conscious) Sedation Local Anesthetic: Lidocaine 1% Route: Intravenous (IV) IV Access: Secured Sedation: Meaningful verbal contact was maintained at all times during the procedure  Indication(s): Analgesia and Anxiety   Indications: 1. Chronic sacroiliac joint pain (Bilateral) (R>L)   2. Chronic low back pain (WC injury) (Primary Source of Pain) (Bilateral) (R>L)   3. Lumbar spondylosis    Pain Score: Pre-procedure: 7 /10 Post-procedure: 0-No pain/10  Pre-op Assessment:  Adam Petty is a 56 y.o. (year old), male patient, seen today for interventional treatment. He  has a past surgical history that includes Tonsillectomy; Shoulder surgery; Palate surgery; Cervical fusion; Foot surgery; Lumbar laminectomy/decompression microdiscectomy (N/A, 02/06/2013); Knee arthroscopy (Right, 10/02/2013); Knee arthroscopy with medial menisectomy (Right, 03/25/2015); Knee bursectomy (Right, 03/25/2015); and Cystoscopy w/ retrogrades (Right, 11/19/2015). Adam Petty has a current medication list which includes the following prescription(s): cyanocobalamin, gabapentin, insulin degludec, ketoconazole, liraglutide, liraglutide -weight management, metformin, novolog flexpen, oxycodone, potassium citrate,  ramipril, rosuvastatin, oxycodone, and oxycodone, and the following Facility-Administered Medications: fentanyl, lactated ringers, and midazolam. His primarily concern today is the Back Pain (lower)  Initial Vital Signs: There were no vitals taken for this visit. BMI: Estimated body mass index is 41 kg/m as calculated from the following:   Height as of this encounter: 5\' 6"  (1.676 m).   Weight as of this encounter: 254 lb (115.2 kg).  Risk Assessment: Allergies: Reviewed. He has No Known Allergies.  Allergy Precautions: None required Coagulopathies: Reviewed. None identified.  Blood-thinner therapy: None at this time Active Infection(s): Reviewed. None identified. Adam Petty is afebrile  Site Confirmation: Adam Petty was asked to confirm the procedure and laterality before marking the site Procedure checklist: Completed Consent: Before the procedure and under the influence of no sedative(s), amnesic(s), or anxiolytics, the patient was informed of the treatment options, risks and possible complications. To fulfill our ethical and legal obligations, as recommended by the American Medical Association's Code of Ethics, I have informed the patient of my clinical impression; the nature and purpose of the treatment or procedure; the risks, benefits, and possible complications of the intervention; the alternatives, including doing nothing; the risk(s) and benefit(s) of the alternative treatment(s) or procedure(s); and the risk(s) and benefit(s) of doing nothing. The patient was provided information about the general risks and possible complications associated with the procedure. These may include, but are not limited to: failure to achieve desired goals, infection, bleeding, organ or nerve damage, allergic reactions, paralysis, and death. In addition, the patient was informed of those risks and complications associated to the procedure, such as failure to decrease pain; infection; bleeding; organ or  nerve damage with subsequent damage to sensory, motor, and/or autonomic systems, resulting in permanent pain, numbness, and/or weakness of one or several areas of the body; allergic reactions; (i.e.: anaphylactic reaction); and/or death. Furthermore, the patient was informed of those risks and complications associated with the medications.  These include, but are not limited to: allergic reactions (i.e.: anaphylactic or anaphylactoid reaction(s)); adrenal axis suppression; blood sugar elevation that in diabetics may result in ketoacidosis or comma; water retention that in patients with history of congestive heart failure may result in shortness of breath, pulmonary edema, and decompensation with resultant heart failure; weight gain; swelling or edema; medication-induced neural toxicity; particulate matter embolism and blood vessel occlusion with resultant organ, and/or nervous system infarction; and/or aseptic necrosis of one or more joints. Finally, the patient was informed that Medicine is not an exact science; therefore, there is also the possibility of unforeseen or unpredictable risks and/or possible complications that may result in a catastrophic outcome. The patient indicated having understood very clearly. We have given the patient no guarantees and we have made no promises. Enough time was given to the patient to ask questions, all of which were answered to the patient's satisfaction. Adam Petty has indicated that he wanted to continue with the procedure. Attestation: I, the ordering provider, attest that I have discussed with the patient the benefits, risks, side-effects, alternatives, likelihood of achieving goals, and potential problems during recovery for the procedure that I have provided informed consent. Date: 11/17/2016; Time: 8:32 AM  Pre-Procedure Preparation:  Monitoring: As per clinic protocol. Respiration, ETCO2, SpO2, BP, heart rate and rhythm monitor placed and checked for adequate  function Safety Precautions: Patient was assessed for positional comfort and pressure points before starting the procedure. Time-out: I initiated and conducted the "Time-out" before starting the procedure, as per protocol. The patient was asked to participate by confirming the accuracy of the "Time Out" information. Verification of the correct person, site, and procedure were performed and confirmed by me, the nursing staff, and the patient. "Time-out" conducted as per Joint Commission's Universal Protocol (UP.01.01.01). "Time-out" Date & Time: 11/17/2016; 1053 hrs.  Description of Procedure Process:  Position: Prone Target Area: Superior, posterior, aspect of the sacroiliac fissure Approach: Posterior, paraspinal, ipsilateral approach. Area Prepped: Entire Lower Lumbosacral Region Prepping solution: ChloraPrep (2% chlorhexidine gluconate and 70% isopropyl alcohol) Safety Precautions: Aspiration looking for blood return was conducted prior to all injections. At no point did we inject any substances, as a needle was being advanced. No attempts were made at seeking any paresthesias. Safe injection practices and needle disposal techniques used. Medications properly checked for expiration dates. SDV (single dose vial) medications used. Description of the Procedure: Protocol guidelines were followed. The patient was placed in position over the procedure table. The target area was identified and the area prepped in the usual manner. Skin & deeper tissues infiltrated with local anesthetic. Appropriate amount of time allowed to pass for local anesthetics to take effect. The procedure needle was advanced under fluoroscopic guidance into the sacroiliac joint until a firm endpoint was obtained. Proper needle placement secured. Negative aspiration confirmed. Solution injected in intermittent fashion, asking for systemic symptoms every 0.5cc of injectate. The needles were then removed and the area cleansed, making  sure to leave some of the prepping solution back to take advantage of its long term bactericidal properties. Vitals:   11/17/16 1103 11/17/16 1108 11/17/16 1118 11/17/16 1128  BP: (!) 130/97 (!) 144/93 (!) 156/98 (!) 151/93  Pulse: 83     Resp: 19 16 17 18   Temp:  98 F (36.7 C)    TempSrc:      SpO2: 96% 95% 96% 96%  Weight:      Height:        Start Time: 1053 hrs. End  Time: 1100 hrs. Materials:  Needle(s) Type: Regular needle Gauge: 22G Length: 3.5-in Medication(s): We administered midazolam, fentaNYL, lidocaine, ropivacaine (PF) 2 mg/mL (0.2%), and methylPREDNISolone acetate. Please see chart orders for dosing details.  Imaging Guidance (Non-Spinal):  Type of Imaging Technique: Fluoroscopy Guidance (Non-Spinal) Indication(s): Assistance in needle guidance and placement for procedures requiring needle placement in or near specific anatomical locations not easily accessible without such assistance. Exposure Time: Please see nurses notes. Contrast: Before injecting any contrast, we confirmed that the patient did not have an allergy to iodine, shellfish, or radiological contrast. Once satisfactory needle placement was completed at the desired level, radiological contrast was injected. Contrast injected under live fluoroscopy. No contrast complications. See chart for type and volume of contrast used. Fluoroscopic Guidance: I was personally present during the use of fluoroscopy. "Tunnel Vision Technique" used to obtain the best possible view of the target area. Parallax error corrected before commencing the procedure. "Direction-depth-direction" technique used to introduce the needle under continuous pulsed fluoroscopy. Once target was reached, antero-posterior, oblique, and lateral fluoroscopic projection used confirm needle placement in all planes. Images permanently stored in EMR. Interpretation: I personally interpreted the imaging intraoperatively. Adequate needle placement confirmed in  multiple planes. Appropriate spread of contrast into desired area was observed. No evidence of afferent or efferent intravascular uptake. Permanent images saved into the patient's record.  Antibiotic Prophylaxis:  Indication(s): None identified Antibiotic given: None  Post-operative Assessment:  EBL: None Complications: No immediate post-treatment complications observed by team, or reported by patient. Note: The patient tolerated the entire procedure well. A repeat set of vitals were taken after the procedure and the patient was kept under observation following institutional policy, for this type of procedure. Post-procedural neurological assessment was performed, showing return to baseline, prior to discharge. The patient was provided with post-procedure discharge instructions, including a section on how to identify potential problems. Should any problems arise concerning this procedure, the patient was given instructions to immediately contact us, at any time, without hesitation. In any case, we plan to contact the patient by telephone for a follow-up status report regarding this interventional procedure. Comments:  No additional relevant information.  Plan of Care    Imaging Orders     DG C-Arm 1-60 Min-No Report  Procedure Orders     SACROILIAC JOINT INJECTINS  Medications ordered for procedure: Meds ordered this encounter  Medications  . lactated ringers infusion 1,000 mL  . midazolam (VERSED) 5 MG/5ML injection 1-2 mg    Make sure Flumazenil is available in the pyxis when using this medication. If oversedation occurs, administer 0.2 mg IV over 15 sec. If after 45 sec no response, administer 0.2 mg again over 1 min; may repeat at 1 min intervals; not to exceed 4 doses (1 mg)  . fentaNYL (SUBLIMAZE) injection 25-50 mcg    Make sure Narcan is available in the pyxis when using this medication. In the event of respiratory depression (RR< 8/min): Titrate NARCAN (naloxone) in increments  of 0.1 to 0.2 mg IV at 2-3 minute intervals, until desired degree of reversal.  . lidocaine (XYLOCAINE) 2 % (with pres) injection 400 mg  . ropivacaine (PF) 2 mg/mL (0.2%) (NAROPIN) injection 9 mL  . methylPREDNISolone acetate (DEPO-MEDROL) injection 80 mg   Medications administered: We administered midazolam, fentaNYL, lidocaine, ropivacaine (PF) 2 mg/mL (0.2%), and methylPREDNISolone acetate.  See the medical record for exact dosing, route, and time of administration.  New Prescriptions   No medications on file   Disposition: Discharge home  Discharge Date & Time: 11/17/2016; 1134 hrs.   Physician-requested Follow-up: Return for post-procedure eval by Dr. Laban Emperor in 2 wks. Future Appointments Date Time Provider Department Center  11/22/2016 8:30 AM Barbette Merino, NP [facility] None  12/07/2016 10:15 AM Delano Metz, MD [facility] None   Primary Care Physician: Patient, No Pcp Per Location: [facility] Note by: Oswaldo Done, MD Date: 11/17/2016; Time: 11:47 AM  Disclaimer:  Medicine is not an exact science. The only guarantee in medicine is that nothing is guaranteed. It is important to note that the decision to proceed with this intervention was based on the information collected from the patient. The Data and conclusions were drawn from the patient's questionnaire, the interview, and the physical examination. Because the information was provided in large part by the patient, it cannot be guaranteed that it has not been purposely or unconsciously manipulated. Every effort has been made to obtain as much relevant data as possible for this evaluation. It is important to note that the conclusions that lead to this procedure are derived in large part from the available data. Always take into account that the treatment will also be dependent on availability of resources and existing treatment guidelines, considered by other Pain Management Practitioners  as being common knowledge and practice, at the time of the intervention. For Medico-Legal purposes, it is also important to point out that variation in procedural techniques and pharmacological choices are the acceptable norm. The indications, contraindications, technique, and results of the above procedure should only be interpreted and judged by a Board-Certified Interventional Pain Specialist with extensive familiarity and expertise in the same exact procedure and technique.

## 2016-11-17 ENCOUNTER — Ambulatory Visit (HOSPITAL_BASED_OUTPATIENT_CLINIC_OR_DEPARTMENT_OTHER): Payer: BLUE CROSS/BLUE SHIELD | Admitting: Pain Medicine

## 2016-11-17 ENCOUNTER — Encounter: Payer: Self-pay | Admitting: Pain Medicine

## 2016-11-17 ENCOUNTER — Ambulatory Visit
Admission: RE | Admit: 2016-11-17 | Discharge: 2016-11-17 | Disposition: A | Discharge status: Home / Self Care | Payer: BLUE CROSS/BLUE SHIELD | Source: Ambulatory Visit | Attending: Pain Medicine | Admitting: Pain Medicine

## 2016-11-17 VITALS — BP 151/93 | HR 83 | Temp 98.0°F | Resp 18 | Ht 66.0 in | Wt 254.0 lb

## 2016-11-17 DIAGNOSIS — Z981 Arthrodesis status: Secondary | ICD-10-CM | POA: Diagnosis not present

## 2016-11-17 DIAGNOSIS — G8929 Other chronic pain: Secondary | ICD-10-CM | POA: Diagnosis not present

## 2016-11-17 DIAGNOSIS — M47816 Spondylosis without myelopathy or radiculopathy, lumbar region: Secondary | ICD-10-CM

## 2016-11-17 DIAGNOSIS — Z794 Long term (current) use of insulin: Secondary | ICD-10-CM | POA: Insufficient documentation

## 2016-11-17 DIAGNOSIS — Z79899 Other long term (current) drug therapy: Secondary | ICD-10-CM | POA: Diagnosis not present

## 2016-11-17 DIAGNOSIS — M533 Sacrococcygeal disorders, not elsewhere classified: Secondary | ICD-10-CM

## 2016-11-17 DIAGNOSIS — Z79891 Long term (current) use of opiate analgesic: Secondary | ICD-10-CM | POA: Insufficient documentation

## 2016-11-17 DIAGNOSIS — M545 Low back pain, unspecified: Secondary | ICD-10-CM

## 2016-11-17 MED ORDER — METHYLPREDNISOLONE ACETATE 80 MG/ML IJ SUSP
80.0000 mg | Freq: Once | INTRAMUSCULAR | Status: AC
  Administered 2016-11-17: 80 mg via INTRA_ARTICULAR

## 2016-11-17 MED ORDER — LACTATED RINGERS IV SOLN: 1000.0000 mL | Freq: Once | INTRAVENOUS | Status: DC

## 2016-11-17 MED ORDER — ROPIVACAINE HCL 2 MG/ML IJ SOLN
INTRAMUSCULAR | Status: AC
  Filled 2016-11-17: qty 10

## 2016-11-17 MED ORDER — LIDOCAINE HCL 2 % IJ SOLN
INTRAMUSCULAR | Status: AC
  Filled 2016-11-17: qty 20

## 2016-11-17 MED ORDER — MIDAZOLAM HCL 5 MG/5ML IJ SOLN
INTRAMUSCULAR | Status: AC
  Filled 2016-11-17: qty 5

## 2016-11-17 MED ORDER — FENTANYL CITRATE (PF) 100 MCG/2ML IJ SOLN
25.0000 ug | INTRAMUSCULAR | Status: DC | PRN
  Administered 2016-11-17: 100 ug via INTRAVENOUS

## 2016-11-17 MED ORDER — LIDOCAINE HCL 2 % IJ SOLN
20.0000 mL | Freq: Once | INTRAMUSCULAR | Status: AC
  Administered 2016-11-17: 400 mg

## 2016-11-17 MED ORDER — METHYLPREDNISOLONE ACETATE 80 MG/ML IJ SUSP
INTRAMUSCULAR | Status: AC
  Filled 2016-11-17: qty 1

## 2016-11-17 MED ORDER — MIDAZOLAM HCL 5 MG/5ML IJ SOLN
1.0000 mg | INTRAMUSCULAR | Status: DC | PRN
  Administered 2016-11-17: 5 mg via INTRAVENOUS

## 2016-11-17 MED ORDER — FENTANYL CITRATE (PF) 100 MCG/2ML IJ SOLN
INTRAMUSCULAR | Status: AC
  Filled 2016-11-17: qty 2

## 2016-11-17 MED ORDER — ROPIVACAINE HCL 2 MG/ML IJ SOLN
9.0000 mL | Freq: Once | INTRAMUSCULAR | Status: AC
  Administered 2016-11-17: 10 mL via INTRA_ARTICULAR

## 2016-11-17 NOTE — Patient Instructions (Signed)

## 2016-11-17 NOTE — Progress Notes (Signed)
Safety precautions to be maintained throughout the outpatient stay will include: orient to surroundings, keep bed in low position, maintain call bell within reach at all times, provide assistance with transfer out of bed and ambulation.  

## 2016-11-18 ENCOUNTER — Telehealth: Payer: Self-pay

## 2016-11-18 NOTE — Telephone Encounter (Signed)
Post procedure phone call.  Left message.  

## 2016-11-22 ENCOUNTER — Ambulatory Visit: Payer: BLUE CROSS/BLUE SHIELD | Attending: Nurse Practitioner | Admitting: Nurse Practitioner

## 2016-12-07 ENCOUNTER — Ambulatory Visit: Payer: BLUE CROSS/BLUE SHIELD | Attending: Pain Medicine | Admitting: Nurse Practitioner

## 2016-12-07 ENCOUNTER — Encounter: Payer: Self-pay | Admitting: Nurse Practitioner

## 2016-12-07 ENCOUNTER — Other Ambulatory Visit: Payer: Self-pay

## 2016-12-07 VITALS — BP 105/61 | HR 94 | Temp 98.1°F | Resp 18 | Ht 66.0 in | Wt 250.0 lb

## 2016-12-07 DIAGNOSIS — E785 Hyperlipidemia, unspecified: Secondary | ICD-10-CM | POA: Insufficient documentation

## 2016-12-07 DIAGNOSIS — M25561 Pain in right knee: Secondary | ICD-10-CM | POA: Diagnosis not present

## 2016-12-07 DIAGNOSIS — M533 Sacrococcygeal disorders, not elsewhere classified: Secondary | ICD-10-CM

## 2016-12-07 DIAGNOSIS — M48061 Spinal stenosis, lumbar region without neurogenic claudication: Secondary | ICD-10-CM | POA: Diagnosis not present

## 2016-12-07 DIAGNOSIS — Z794 Long term (current) use of insulin: Secondary | ICD-10-CM | POA: Insufficient documentation

## 2016-12-07 DIAGNOSIS — Z8249 Family history of ischemic heart disease and other diseases of the circulatory system: Secondary | ICD-10-CM | POA: Diagnosis not present

## 2016-12-07 DIAGNOSIS — M25511 Pain in right shoulder: Secondary | ICD-10-CM | POA: Diagnosis not present

## 2016-12-07 DIAGNOSIS — G8929 Other chronic pain: Secondary | ICD-10-CM

## 2016-12-07 DIAGNOSIS — E119 Type 2 diabetes mellitus without complications: Secondary | ICD-10-CM | POA: Insufficient documentation

## 2016-12-07 DIAGNOSIS — M79604 Pain in right leg: Secondary | ICD-10-CM | POA: Diagnosis not present

## 2016-12-07 DIAGNOSIS — Z9889 Other specified postprocedural states: Secondary | ICD-10-CM | POA: Diagnosis not present

## 2016-12-07 DIAGNOSIS — Z5181 Encounter for therapeutic drug level monitoring: Secondary | ICD-10-CM | POA: Diagnosis present

## 2016-12-07 DIAGNOSIS — G894 Chronic pain syndrome: Secondary | ICD-10-CM | POA: Diagnosis not present

## 2016-12-07 DIAGNOSIS — M545 Low back pain: Secondary | ICD-10-CM

## 2016-12-07 DIAGNOSIS — G4730 Sleep apnea, unspecified: Secondary | ICD-10-CM | POA: Diagnosis not present

## 2016-12-07 DIAGNOSIS — Z833 Family history of diabetes mellitus: Secondary | ICD-10-CM | POA: Insufficient documentation

## 2016-12-07 DIAGNOSIS — Z79899 Other long term (current) drug therapy: Secondary | ICD-10-CM | POA: Insufficient documentation

## 2016-12-07 DIAGNOSIS — Z87442 Personal history of urinary calculi: Secondary | ICD-10-CM | POA: Insufficient documentation

## 2016-12-07 DIAGNOSIS — M792 Neuralgia and neuritis, unspecified: Secondary | ICD-10-CM

## 2016-12-07 MED ORDER — OXYCODONE HCL 5 MG PO TABS: 5.0000 mg | ORAL_TABLET | Freq: Four times a day (QID) | ORAL | 0 refills | Status: DC | PRN

## 2016-12-07 MED ORDER — GABAPENTIN 600 MG PO TABS: 600.0000 mg | ORAL_TABLET | Freq: Four times a day (QID) | ORAL | 0 refills | Status: DC

## 2016-12-07 NOTE — Progress Notes (Signed)
Nursing Pain Medication Assessment:  Safety precautions to be maintained throughout the outpatient stay will include: orient to surroundings, keep bed in low position, maintain call bell within reach at all times, provide assistance with transfer out of bed and ambulation.  Medication Inspection Compliance: [name] did not comply with our request to bring his pills to be counted. He was reminded that bringing the medication bottles, even when empty, is a requirement.  Medication: None brought in. Pill/Patch Count: None available to be counted. Bottle Appearance: No container available. Did not bring bottle(s) to appointment. Filled Date: N/A Last Medication intake:  Today

## 2016-12-07 NOTE — Progress Notes (Addendum)
Patient's Name: Adam Petty.  MRN: [id number]  Referring Provider: No ref. provider found  DOB: 12-25-1960  PCP: Patient, No Pcp Per  DOS: 12/07/2016  Note by: Vevelyn Francois NP  Service setting: Ambulatory outpatient  Specialty: Interventional Pain Management  Location: [facility] (AMB) Pain Management Facility    Patient type: Established    Primary Reason(s) for Visit: Encounter for prescription drug management & post-procedure evaluation of chronic illness with mild to moderate exacerbation(Level of risk: moderate) CC: Knee Pain (right); Back Pain (right); and Shoulder Pain (right)  HPI  Adam Petty is a 56 y.o. year old, male patient, who comes today for a post-procedure evaluation and medication management. 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); Discogenic syndrome, lumbar (WC injury); Osteoarthritis of spine with radiculopathy, lumbar region (WC injury); Lumbar facet syndrome (WC injury) (Bilateral) (R>L); Lumbosacral radiculopathy (WC injury); Pain in right knee (WC injury); Obesity, Class III, BMI 40-49.9 (morbid obesity) ([place]); Uncomplicated opioid dependence ([place]); Therapeutic opioid-induced constipation (OIC); Dyslipidemia; Microalbuminuria; Adiposity; Lumbar canal stenosis; Type 2 diabetes mellitus ([place]); Long term prescription opiate use; Encounter for chronic pain management; Neurogenic pain; Neuropathic pain; Musculoskeletal pain; Lumbar spondylosis; Chronic knee pain (Secondary source of pain) (Right); Osteoarthritis of knee (Location of Secondary source of pain) (Right); Chronic shoulder pain [facility] source of pain) (Right); 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 His primarily concern today is the Knee Pain (right); Back Pain (right); and Shoulder Pain (right)  Pain Assessment: Location: Right Knee(back and shoulder) Radiating:   Onset: More than a month ago Duration: Chronic pain Quality: Aching, Stabbing Severity: 7 /10 (self-reported pain score)  Note: Reported level is compatible with observation. Clinically the patient looks like a 1/10 A 1/10 is viewed as "Mild" and described as nagging, annoying, but not interfering with basic activities of daily living (ADL). Adam Petty is able to eat, bathe, get dressed, do toileting (being able to get on and off the toilet and perform personal hygiene functions), transfer (move in and out of bed or a chair without assistance), and maintain continence (able to control bladder and bowel functions). Physiologic parameters such as blood pressure and heart rate apear wnl.       When using our objective Pain Scale, levels between 6 and 10/10 are said to belong in an emergency room, as it progressively worsens from a 6/10, described as severely limiting, requiring emergency care not usually available at an outpatient pain management facility. At a 6/10 level, communication becomes difficult and requires great effort. Assistance to reach the emergency department may be required. Facial flushing and profuse sweating along with potentially dangerous increases in heart rate and blood pressure will be evident. Effect on ADL:   Timing: Constant Modifying factors: heat  Mr. Dubin was last seen on 11/22/2016 for a procedure. During today's appointment we reviewed Adam Petty post-procedure results, as well as his outpatient medication regimen. He states that  the numbness and tingling goes down the back of his leg into his foot. He admits that  he got no relief from the SI joint injection. He states that the pain may even got a little worse.  He states that his knee pain is the worse today. He denies any weakness. He states that he is concern with why this was not effective. He admits that the only thing that he did differently was to not take his pain medication on the day of the procedure. He states that for  2 weeks the pain medication along with Aleve was not effective.   Further details on both, my assessment(s), as well as the proposed treatment plan, please see below.  Controlled Substance Pharmacotherapy Assessment REMS (Risk Evaluation and Mitigation Strategy)  Analgesic: Oxycodone IR 5 mg 1 tablet by mouth every 6 hours (20 mg/day)  MME/day: 30 mg/day   Dewayne Shorter, RN  12/07/2016  8:18 AM  Signed Nursing Pain Medication Assessment:  Safety precautions to be maintained throughout the outpatient stay will include: orient to surroundings, keep bed in low position, maintain call bell within reach at all times, provide assistance with transfer out of bed and ambulation.  Medication Inspection Compliance: Adam Petty did not comply with our request to bring his pills to be counted. He was reminded that bringing the medication bottles, even when empty, is a requirement.  Medication: None brought in. Pill/Patch Count: None available to be counted. Bottle Appearance: No container available. Did not bring bottle(s) to appointment. Filled Date: N/A Last Medication intake:  Today   Pharmacokinetics: Liberation and absorption (onset of action): WNL Distribution (time to peak effect): WNL Metabolism and excretion (duration of action): WNL         Pharmacodynamics: Desired effects: Analgesia: Adam Petty reports >50% benefit. Functional ability: Patient reports that medication allows him to accomplish basic ADLs Clinically meaningful improvement  in function (CMIF): Sustained CMIF goals met Perceived effectiveness: Described as relatively effective, allowing for increase in activities of daily living (ADL) Undesirable effects: Side-effects or Adverse reactions: None reported Monitoring: [place] PMP: Online review of the past 4-monthperiod conducted. Compliant with practice rules and regulations Last UDS on record: Summary  Date Value Ref Range Status  08/25/2016 FINAL  Final    Comment:    ==================================================================== TOXASSURE SELECT 13 (MW) ==================================================================== Test                             Result       Flag       Units Drug Present and Declared for Prescription Verification   Oxycodone                      786          EXPECTED   ng/mg creat   Oxymorphone                    1124         EXPECTED   ng/mg creat   Noroxycodone                   1522         EXPECTED   ng/mg creat   Noroxymorphone                 250          EXPECTED   ng/mg creat    Sources of oxycodone are scheduled prescription medications.    Oxymorphone, noroxycodone, and noroxymorphone are expected  metabolites of oxycodone. Oxymorphone is also available as a    scheduled prescription medication. Drug Absent but Declared for Prescription Verification   Tapentadol                     Not Detected UNEXPECTED ng/mg creat ==================================================================== Test                      Result    Flag   Units      Ref Range   Creatinine              113              mg/dL      >=20 ==================================================================== Declared Medications:  The flagging and interpretation on this report are based on the  following declared medications.  Unexpected results may arise from  inaccuracies in the declared medications.  **Note: The testing scope of this panel includes these medications:  Oxycodone (Oxy IR)   Oxycodone (Roxicodone)  Tapentadol  **Note: The testing scope of this panel does not include following  reported medications:  Gabapentin (Neurontin)  Insulin (NovoLog)  Insulin Tyler Aas)  Ketoconazole (Nizoral)  Liraglutide  Metformin (Glucophage)  Phentermine  Potassium Citrate (Urocit-K)  Ramipril (Altace)  Rosuvastatin (Crestor)  Vitamin B12 ==================================================================== For clinical consultation, please call [phone]. ====================================================================    UDS interpretation: Compliant          Medication Assessment Form: Reviewed. Patient indicates being compliant with therapy Treatment compliance: Compliant Risk Assessment Profile: Aberrant behavior: See prior evaluations. None observed or detected today Comorbid factors increasing risk of overdose: See prior notes. No additional risks detected today Risk of substance use disorder (SUD): Low Opioid Risk Tool - 01/23/17 0801      Family History of Substance Abuse   Alcohol  Negative    Illegal Drugs  Negative    Rx Drugs  Negative      Personal History of Substance Abuse   Alcohol  Negative    Illegal Drugs  Negative    Rx Drugs  Negative      Total Score   Opioid Risk Tool Scoring  0    Opioid Risk Interpretation  Low Risk      ORT Scoring interpretation table:  Score <3 = Low Risk for SUD  Score between 4-7 = Moderate Risk for SUD  Score >8 = High Risk for Opioid Abuse   Risk Mitigation Strategies:  Patient Counseling: Covered Patient-Prescriber Agreement (PPA): Present and active  Notification to other healthcare providers: Done  Pharmacologic Plan: No change in therapy, at this time  Post-Procedure Assessment  11/22/2016 Procedure: bilateral SI joint injections Pre-procedure pain score:  7/10 Post-procedure pain score: 0/10         Influential Factors: BMI: 40.35 kg/m Intra-procedural challenges: None observed.          Assessment challenges: None detected.              Reported side-effects: None.        Post-procedural adverse reactions or complications: None reported         Sedation: Please see nurses note. When no sedatives are used, the analgesic levels obtained are directly associated to the effectiveness of the local anesthetics. However, when sedation is provided, the level of analgesia obtained during the initial 1 hour following the intervention, is believed to be the result of a combination of factors. These factors may include, but are not limited to: 1.  The effectiveness of the local anesthetics used. 2. The effects of the analgesic(s) and/or anxiolytic(s) used. 3. The degree of discomfort experienced by the patient at the time of the procedure. 4. The patients ability and reliability in recalling and recording the events. 5. The presence and influence of possible secondary gains and/or psychosocial factors. Reported result: Relief experienced during the 1st hour after the procedure: 0 % (Ultra-Short Term Relief)            Interpretative annotation: Clinically appropriate result. Analgesia during this period is likely to be Local Anesthetic and/or IV Sedative (Analgesic/Anxiolytic) related.          Effects of local anesthetic: The analgesic effects attained during this period are directly associated to the localized infiltration of local anesthetics and therefore cary significant diagnostic value as to the etiological location, or anatomical origin, of the pain. Expected duration of relief is directly dependent on the pharmacodynamics of the local anesthetic used. Long-acting (4-6 hours) anesthetics used.  Reported result: Relief during the next 4 to 6 hour after the procedure: 0 % (Short-Term Relief)            Interpretative annotation: Clinically appropriate result. Analgesia during this period is likely to be Local Anesthetic-related.          Long-term benefit: Defined as the period of time  past the expected duration of local anesthetics (1 hour for short-acting and 4-6 hours for long-acting). With the possible exception of prolonged sympathetic blockade from the local anesthetics, benefits during this period are typically attributed to, or associated with, other factors such as analgesic sensory neuropraxia, antiinflammatory effects, or beneficial biochemical changes provided by agents other than the local anesthetics.  Reported result: Extended relief following procedure: 0 % (Long-Term Relief)            Interpretative annotation: Clinically appropriate result. Good relief. No permanent benefit expected. Inflammation plays a part in the etiology to the pain.          Current benefits: Defined as reported results that persistent at this point in time.   Analgesia: 0 %            Function: No improvement ROM: No improvement Interpretative annotation: Recurrence of symptoms. No permanent benefit expected.             Interpretation: Results would suggest failure of therapy in achieving desired goal(s).                  Plan:  Please see "Plan of Care" for details.        Laboratory Chemistry  Inflammation Markers (CRP: Acute Phase) (ESR: Chronic Phase) Lab Results  Component Value Date   CRP 0.6 03/12/2015   ESRSEDRATE 9 03/12/2015                 Renal Function Markers Lab Results  Component Value Date   BUN 15 03/12/2015   CREATININE 0.82 03/12/2015   GFRAA >60 03/12/2015   GFRNONAA >60 03/12/2015                 Hepatic Function Markers Lab Results  Component Value Date   AST 41 03/12/2015   ALT 55 03/12/2015   ALBUMIN 4.2 03/12/2015   ALKPHOS 89 03/12/2015                 Electrolytes Lab Results  Component Value Date   NA 140 03/12/2015   K 4.2 03/12/2015   CL 105 03/12/2015  CALCIUM 10.0 03/12/2015   MG 1.8 03/12/2015                 Neuropathy Markers No results found for: [id number]               Bone Pathology Markers Lab Results   Component Value Date   ALKPHOS 89 03/12/2015   CALCIUM 10.0 03/12/2015                 Rheumatology Markers No results found for: LABURIC, URICUR              Coagulation Parameters Lab Results  Component Value Date   PLT 176 03/13/2015                 Cardiovascular Markers Lab Results  Component Value Date   CKTOTAL 184 08/31/2012   CKMB 1.1 08/31/2012   TROPONINI < 0.02 08/31/2012   HGB 15.3 03/13/2015   HCT 46.4 03/13/2015                 CA Markers No results found for: CEA, CA125, LABCA2               Note: Lab results reviewed.  Recent Diagnostic Imaging Results  DG C-Arm 1-60 Min-No Report Fluoroscopy was utilized by the requesting physician.  No radiographic  interpretation.   Complexity Note: Imaging results reviewed. Results shared with Mr. Nienhaus, using Layman's terms.                         Meds   Current Outpatient Medications:  .  cyanocobalamin (,VITAMIN B-12,) 1000 MCG/ML injection, Inject into the muscle every 30 (thirty) days. , Disp: , Rfl:  .  gabapentin (NEURONTIN) 600 MG tablet, Take 1 tablet (600 mg total) by mouth every 6 (six) hours., Disp: 360 tablet, Rfl: 0 .  insulin degludec (TRESIBA FLEXTOUCH) 100 UNIT/ML SOPN FlexTouch Pen, Inject 40 Units into the skin daily. , Disp: , Rfl:  .  ketoconazole (NIZORAL) 2 % shampoo, APPLY TO AFFECTED AREA EVERY DAY AS NEEDED, Disp: , Rfl: 4 .  liraglutide (VICTOZA) 18 MG/3ML SOPN, INJECT 0.3 MLS (1.8 MG TOTAL) SUBCUTANEOUSLY ONCE DAILY., Disp: , Rfl:  .  metFORMIN (GLUCOPHAGE) 1000 MG tablet, TAKE 1 TABLET (1,000 MG TOTAL) BY MOUTH 2 (TWO) TIMES DAILY WITH MEALS., Disp: , Rfl: 1 .  NOVOLOG FLEXPEN 100 UNIT/ML FlexPen, Inject 0-10 Units into the skin daily as needed for high blood sugar (sliding scale). Reported on 02/24/2015, Disp: , Rfl: 0 .  oxyCODONE (OXY IR/ROXICODONE) 5 MG immediate release tablet, Take 1 tablet (5 mg total) by mouth every 6 (six) hours as needed for severe pain., Disp: 120 tablet,  Rfl: 0 .  potassium citrate (UROCIT-K) 10 MEQ (1080 MG) SR tablet, Take 10 mEq by mouth 2 (two) times daily., Disp: , Rfl:  .  rosuvastatin (CRESTOR) 5 MG tablet, Take 5 mg by mouth 3 (three) times a week. Monday Wednesday Friday, Disp: , Rfl:  .  oxyCODONE (OXY IR/ROXICODONE) 5 MG immediate release tablet, Take 1 tablet (5 mg total) by mouth every 6 (six) hours as needed for severe pain., Disp: 120 tablet, Rfl: 0 .  oxyCODONE (OXY IR/ROXICODONE) 5 MG immediate release tablet, Take 1 tablet (5 mg total) by mouth every 6 (six) hours as needed for severe pain., Disp: 120 tablet, Rfl: 0  ROS  Constitutional: Denies any fever or chills Gastrointestinal: No reported hemesis, hematochezia, vomiting, or acute  GI distress Musculoskeletal: Denies any acute onset joint swelling, redness, loss of ROM, or weakness Neurological: No reported episodes of acute onset apraxia, aphasia, dysarthria, agnosia, amnesia, paralysis, loss of coordination, or loss of consciousness  Allergies  Mr. Minnie has No Known Allergies.  [place]  Drug: Mr. Gaskill  reports that he does not use drugs. Alcohol:  reports that he drinks alcohol. Tobacco:  reports that  has never smoked. he has never used smokeless tobacco. Medical:  has a past medical history of Arthritis, senescent (10/22/2014), Back pain, Chronic pain syndrome (10/22/2014), Diabetes mellitus without complication ([place]), History of kidney stones, IBS (irritable bowel syndrome), Lateral meniscus tear, Preventative health care, Sleep apnea, and Spinal stenosis. Surgical: Mr. Rumbold  has a past surgical history that includes Tonsillectomy; Shoulder surgery; Palate surgery; Cervical fusion; Foot surgery; Lumbar laminectomy/decompression microdiscectomy (N/A, 02/06/2013); Knee arthroscopy (Right, 10/02/2013); Knee arthroscopy with medial menisectomy (Right, 03/25/2015); Knee bursectomy (Right, 03/25/2015); and Cystoscopy w/ retrogrades (Right, 11/19/2015). Family: family history  includes COPD in his mother; Cancer in his mother; Diabetes in his father; Heart disease in his father and mother.  Constitutional Exam  General appearance: Well nourished, well developed, and well hydrated. In no apparent acute distress Vitals:   12/07/16 0811 12/07/16 0812  BP:  105/61  Pulse:  94  Resp:  18  Temp:  98.1 F (36.7 C)  SpO2:  99%  Weight: 250 lb (113.4 kg)   Height: _0  (1.676 m)    BMI Assessment: Estimated body mass index is 40.35 kg/m as calculated from the following:   Height as of this encounter: _1  (1.676 m).   Weight as of this encounter: 250 lb (113.4 kg).  BMI interpretation table: BMI level Category Range association with higher incidence of chronic pain  <18 kg/m2 Underweight   18.5-24.9 kg/m2 Ideal body weight   25-29.9 kg/m2 Overweight Increased incidence by 20%  30-34.9 kg/m2 Obese (Class I) Increased incidence by 68%  35-39.9 kg/m2 Severe obesity (Class II) Increased incidence by 136%  >40 kg/m2 Extreme obesity (Class III) Increased incidence by 254%   BMI Readings from Last 4 Encounters:  03/06/17 39.54 kg/m  01/23/17 39.54 kg/m  12/07/16 40.35 kg/m  11/17/16 41.00 kg/m   Wt Readings from Last 4 Encounters:  03/06/17 245 lb (111.1 kg)  01/23/17 245 lb (111.1 kg)  12/07/16 250 lb (113.4 kg)  11/17/16 254 lb (115.2 kg)  Psych/Mental status: Alert, oriented x 3 (person, place, & time)       Eyes: PERLA Respiratory: No evidence of acute respiratory distress  Cervical Spine Area Exam  Skin & Axial Inspection: No masses, redness, edema, swelling, or associated skin lesions Alignment: Symmetrical Functional ROM: Unrestricted ROM      Stability: No instability detected Muscle Tone/Strength: Functionally intact. No obvious neuro-muscular anomalies detected. Sensory (Neurological): Unimpaired Palpation: No palpable anomalies              Upper Extremity (UE) Exam    Side: Right upper extremity  Side: Left upper extremity  Skin &  Extremity Inspection: Skin color, temperature, and hair growth are WNL. No peripheral edema or cyanosis. No masses, redness, swelling, asymmetry, or associated skin lesions. No contractures.  Skin & Extremity Inspection: Skin color, temperature, and hair growth are WNL. No peripheral edema or cyanosis. No masses, redness, swelling, asymmetry, or associated skin lesions. No contractures.  Functional ROM: Unrestricted ROM          Functional ROM: Unrestricted ROM  Muscle Tone/Strength: Functionally intact. No obvious neuro-muscular anomalies detected.  Muscle Tone/Strength: Functionally intact. No obvious neuro-muscular anomalies detected.  Sensory (Neurological): Unimpaired          Sensory (Neurological): Unimpaired          Palpation: No palpable anomalies              Palpation: No palpable anomalies              Specialized Test(s): Deferred         Specialized Test(s): Deferred          Thoracic Spine Area Exam  Skin & Axial Inspection: No masses, redness, or swelling Alignment: Symmetrical Functional ROM: Unrestricted ROM Stability: No instability detected Muscle Tone/Strength: Functionally intact. No obvious neuro-muscular anomalies detected. Sensory (Neurological): Unimpaired Muscle strength & Tone: No palpable anomalies  Lumbar Spine Area Exam  Skin & Axial Inspection: No masses, redness, or swelling Alignment: Symmetrical Functional ROM: Unrestricted ROM      Stability: No instability detected Muscle Tone/Strength: Functionally intact. No obvious neuro-muscular anomalies detected. Sensory (Neurological): Unimpaired Palpation: Complains of area being tender to palpation       Provocative Tests: Lumbar Hyperextension and rotation test: evaluation deferred today       Lumbar Lateral bending test: evaluation deferred today       Patrick's Maneuver: evaluation deferred today                    Gait & Posture Assessment  Ambulation: Unassisted Gait: Relatively normal for age  and body habitus Posture: WNL   Lower Extremity Exam    Side: Right lower extremity  Side: Left lower extremity  Skin & Extremity Inspection: Skin color, temperature, and hair growth are WNL. No peripheral edema or cyanosis. No masses, redness, swelling, asymmetry, or associated skin lesions. No contractures.  Skin & Extremity Inspection: Skin color, temperature, and hair growth are WNL. No peripheral edema or cyanosis. No masses, redness, swelling, asymmetry, or associated skin lesions. No contractures.  Functional ROM: Unrestricted ROM          Functional ROM: Unrestricted ROM          Muscle Tone/Strength: Functionally intact. No obvious neuro-muscular anomalies detected.  Muscle Tone/Strength: Functionally intact. No obvious neuro-muscular anomalies detected.  Sensory (Neurological): Unimpaired  Sensory (Neurological): Unimpaired  Palpation: No palpable anomalies  Palpation: No palpable anomalies   Assessment  Primary Diagnosis & Pertinent Problem List: The primary encounter diagnosis was Chronic sacroiliac joint pain (Bilateral) (R>L). Diagnoses of Chronic low back pain (WC injury) (Primary Source of Pain) (Bilateral) (R>L), Chronic lower extremity pain (Right), Chronic pain syndrome, and Neurogenic pain were also pertinent to this visit.  Status Diagnosis  Controlled Controlled Controlled 1. Chronic sacroiliac joint pain (Bilateral) (R>L)   2. Chronic low back pain (WC injury) (Primary Source of Pain) (Bilateral) (R>L)   3. Chronic lower extremity pain (Right)   4. Chronic pain syndrome   5. Neurogenic pain     Problems updated and reviewed during this visit: No problems updated. Plan of Care  Pharmacotherapy (Medications Ordered): Meds ordered this encounter  Medications  . oxyCODONE (OXY IR/ROXICODONE) 5 MG immediate release tablet    Sig: Take 1 tablet (5 mg total) by mouth every 6 (six) hours as needed for severe pain.    Dispense:  120 tablet    Refill:  0    Do not place  this medication, or any other prescription from our practice, on "Automatic Refill".  Patient may have prescription filled one day early if pharmacy is closed on scheduled refill date. Do not fill until:02/06/2017 To last until: 03/08/2017    Order Specific Question:   Supervising Provider    Answer:   Milinda Pointer [phone]  . oxyCODONE (OXY IR/ROXICODONE) 5 MG immediate release tablet    Sig: Take 1 tablet (5 mg total) by mouth every 6 (six) hours as needed for severe pain.    Dispense:  120 tablet    Refill:  0    Do not place this medication, or any other prescription from our practice, on "Automatic Refill". Patient may have prescription filled one day early if pharmacy is closed on scheduled refill date. Do not fill until: 01/07/2017 To last until:02/06/2017    Order Specific Question:   Supervising Provider    Answer:   Milinda Pointer [phone]  . oxyCODONE (OXY IR/ROXICODONE) 5 MG immediate release tablet    Sig: Take 1 tablet (5 mg total) by mouth every 6 (six) hours as needed for severe pain.    Dispense:  120 tablet    Refill:  0    Do not place this medication, or any other prescription from our practice, on "Automatic Refill". Patient may have prescription filled one day early if pharmacy is closed on scheduled refill date. Do not fill until: 12/08/2016 To last until:01/07/2017    Order Specific Question:   Supervising Provider    Answer:   Milinda Pointer [phone]  . gabapentin (NEURONTIN) 600 MG tablet    Sig: Take 1 tablet (600 mg total) by mouth every 6 (six) hours.    Dispense:  360 tablet    Refill:  0    Do not place this medication, or any other prescription from our practice, on "Automatic Refill". Patient may have prescription filled one day early if pharmacy is closed on scheduled refill date.    Order Specific Question:   Supervising Provider    Answer:   Milinda Pointer [[id number]]   New Prescriptions   No medications on file   Medications  administered today: [name]. "Pilar Plate" had no medications administered during this visit. Lab-work, procedure(s), and/or referral(s): No orders of the defined types were placed in this encounter.  Imaging and/or referral(s): None  Interventional management options: Planned, scheduled, and/or pending:   Not at this time    Considering:   Palliativebilateral lumbar facet block Palliativebilateral lumbar facet radiofrequencyablation (Right side done 04/13/2016) (100% relief) Diagnostic right knee genicular nerve block.  Possible right knee genicular nerve radiofrequencyablation   Palliative PRN treatment(s):   Diagnostic Right Suprascapular nerve Block#2 Possible Right Suprascapular nerveRFA   Provider-requested follow-up: Return in about 3 months (around 03/09/2017) for MedMgmt.  No future appointments. Primary Care Physician: Patient, No Pcp Per Location: [facility] Note by: Vevelyn Francois NP Date: 12/07/2016; Time: 9:16 AM  Pain Score Disclaimer: We use the NRS-11 scale. This is a self-reported, subjective measurement of pain severity with only modest accuracy. It is used primarily to identify changes within a particular patient. It must be understood that outpatient pain scales are significantly less accurate that those used for research, where they can be applied under ideal controlled circumstances with minimal exposure to variables. In reality, the score is likely to be a combination of pain intensity and pain affect, where pain affect describes the degree of emotional arousal or changes in action readiness caused by the sensory experience of pain. Factors such as social and  work situation, setting, emotional state, anxiety levels, expectation, and prior pain experience may influence pain perception and show large inter-individual differences that may also be affected by time variables.  Patient instructions provided during  this appointment: Patient Instructions    ____________________________________________________________________________________________  Medication Rules  Applies to: All patients receiving prescriptions (written or electronic).  Pharmacy of record: Pharmacy where electronic prescriptions will be sent. If written prescriptions are taken to a different pharmacy, please inform the nursing staff. The pharmacy listed in the electronic medical record should be the one where you would like electronic prescriptions to be sent.  Prescription refills: Only during scheduled appointments. Applies to both, written and electronic prescriptions.  NOTE: The following applies primarily to controlled substances (Opioid* Pain Medications).   Patient's responsibilities: 1. Pain Pills: Bring all pain pills to every appointment (except for procedure appointments). 2. Pill Bottles: Bring pills in original pharmacy bottle. Always bring newest bottle. Bring bottle, even if empty. 3. Medication refills: You are responsible for knowing and keeping track of what medications you need refilled. The day before your appointment, write a list of all prescriptions that need to be refilled. Bring that list to your appointment and give it to the admitting nurse. Prescriptions will be written only during appointments. If you forget a medication, it will not be "Called in", "Faxed", or "electronically sent". You will need to get another appointment to get these prescribed. 4. Prescription Accuracy: You are responsible for carefully inspecting your prescriptions before leaving our office. Have the discharge nurse carefully go over each prescription with you, before taking them home. Make sure that your name is accurately spelled, that your address is correct. Check the name and dose of your medication to make sure it is accurate. Check the number of pills, and the written instructions to make sure they are clear and accurate. Make  sure that you are given enough medication to last until your next medication refill appointment. 5. Taking Medication: Take medication as prescribed. Never take more pills than instructed. Never take medication more frequently than prescribed. Taking less pills or less frequently is permitted and encouraged, when it comes to controlled substances (written prescriptions).  6. Inform other Doctors: Always inform, all of your healthcare providers, of all the medications you take. 7. Pain Medication from other Providers: You are not allowed to accept any additional pain medication from any other Doctor or Healthcare provider. There are two exceptions to this rule. (see below) In the event that you require additional pain medication, you are responsible for notifying us, as stated below. 8. Medication Agreement: You are responsible for carefully reading and following our Medication Agreement. This must be signed before receiving any prescriptions from our practice. Safely store a copy of your signed Agreement. Violations to the Agreement will result in no further prescriptions. (Additional copies of our Medication Agreement are available upon request.) 9. Laws, Rules, & Regulations: All patients are expected to follow all Federal and [employer], [employer], Rules, [employer]. Ignorance of the Laws does not constitute a valid excuse. The use of any illegal substances is prohibited. 10. Adopted CDC guidelines & recommendations: Target dosing levels will be at or below 60 MME/day. Use of benzodiazepines** is not recommended.  Exceptions: There are only two exceptions to the rule of not receiving pain medications from other Healthcare Providers. 1. Exception #1 (Emergencies): In the event of an emergency (i.e.: accident requiring emergency care), you are allowed to receive additional pain medication. However, you are responsible for:  As soon as you are able, call our office [phone], at any time of the day or  night, and leave a message stating your name, the date and nature of the emergency, and the name and dose of the medication prescribed. In the event that your call is answered by a member of our staff, make sure to document and save the date, time, and the name of the person that took your information.  2. Exception #2 (Planned Surgery): In the event that you are scheduled by another doctor or dentist to have any type of surgery or procedure, you are allowed (for a period no longer than 30 days), to receive additional pain medication, for the acute post-op pain. However, in this case, you are responsible for picking up a copy of our "Post-op Pain Management for Surgeons" handout, and giving it to your surgeon or dentist. This document is available at our office, and does not require an appointment to obtain it. Simply go to our office during business hours (Monday-Thursday from 8:00 AM to 4:00 PM) (Friday 8:00 AM to 12:00 Noon) or if you have a scheduled appointment with [place], prior to your surgery, and ask for it by name. In addition, you will need to provide [place] with your name, name of your surgeon, type of surgery, and date of procedure or surgery.  *Opioid medications include: morphine, codeine, oxycodone, oxymorphone, hydrocodone, hydromorphone, meperidine, tramadol, tapentadol, buprenorphine, fentanyl, methadone. **Benzodiazepine medications include: diazepam (Valium), alprazolam (Xanax), clonazepam (Klonopine), lorazepam (Ativan), clorazepate (Tranxene), chlordiazepoxide (Librium), estazolam (Prosom), oxazepam (Serax), temazepam (Restoril), triazolam (Halcion)  ____________________________________________________________________________________________  BMI Assessment: Estimated body mass index is 40.35 kg/m as calculated from the following:   Height as of this encounter: _0  (1.676 m).   Weight as of this encounter: 250 lb (113.4 kg).  BMI interpretation table: BMI level Category Range  association with higher incidence of chronic pain  <18 kg/m2 Underweight   18.5-24.9 kg/m2 Ideal body weight   25-29.9 kg/m2 Overweight Increased incidence by 20%  30-34.9 kg/m2 Obese (Class I) Increased incidence by 68%  35-39.9 kg/m2 Severe obesity (Class II) Increased incidence by 136%  >40 kg/m2 Extreme obesity (Class III) Increased incidence by 254%   BMI Readings from Last 4 Encounters:  12/07/16 40.35 kg/m  11/17/16 41.00 kg/m  10/10/16 38.95 kg/m  09/08/16 40.35 kg/m   Wt Readings from Last 4 Encounters:  12/07/16 250 lb (113.4 kg)  11/17/16 254 lb (115.2 kg)  10/10/16 245 lb (111.1 kg)  09/08/16 250 lb (113.4 kg)

## 2016-12-07 NOTE — Patient Instructions (Addendum)
____________________________________________________________________________________________  Medication Rules  Applies to: All patients receiving prescriptions (written or electronic).  Pharmacy of record: Pharmacy where electronic prescriptions will be sent. If written prescriptions are taken to a different pharmacy, please inform the nursing staff. The pharmacy listed in the electronic medical record should be the one where you would like electronic prescriptions to be sent.  Prescription refills: Only during scheduled appointments. Applies to both, written and electronic prescriptions.  NOTE: The following applies primarily to controlled substances (Opioid* Pain Medications).   Patient's responsibilities: 1. Pain Pills: Bring all pain pills to every appointment (except for procedure appointments). 2. Pill Bottles: Bring pills in original pharmacy bottle. Always bring newest bottle. Bring bottle, even if empty. 3. Medication refills: You are responsible for knowing and keeping track of what medications you need refilled. The day before your appointment, write a list of all prescriptions that need to be refilled. Bring that list to your appointment and give it to the admitting nurse. Prescriptions will be written only during appointments. If you forget a medication, it will not be "Called in", "Faxed", or "electronically sent". You will need to get another appointment to get these prescribed. 4. Prescription Accuracy: You are responsible for carefully inspecting your prescriptions before leaving our office. Have the discharge nurse carefully go over each prescription with you, before taking them home. Make sure that your name is accurately spelled, that your address is correct. Check the name and dose of your medication to make sure it is accurate. Check the number of pills, and the written instructions to make sure they are clear and accurate. Make sure that you are given enough medication to  last until your next medication refill appointment. 5. Taking Medication: Take medication as prescribed. Never take more pills than instructed. Never take medication more frequently than prescribed. Taking less pills or less frequently is permitted and encouraged, when it comes to controlled substances (written prescriptions).  6. Inform other Doctors: Always inform, all of your healthcare providers, of all the medications you take. 7. Pain Medication from other Providers: You are not allowed to accept any additional pain medication from any other Doctor or Healthcare provider. There are two exceptions to this rule. (see below) In the event that you require additional pain medication, you are responsible for notifying us, as stated below. 8. Medication Agreement: You are responsible for carefully reading and following our Medication Agreement. This must be signed before receiving any prescriptions from our practice. Safely store a copy of your signed Agreement. Violations to the Agreement will result in no further prescriptions. (Additional copies of our Medication Agreement are available upon request.) 9. Laws, Rules, & Regulations: All patients are expected to follow all Federal and State Laws, Statutes, Rules, & Regulations. Ignorance of the Laws does not constitute a valid excuse. The use of any illegal substances is prohibited. 10. Adopted CDC guidelines & recommendations: Target dosing levels will be at or below 60 MME/day. Use of benzodiazepines** is not recommended.  Exceptions: There are only two exceptions to the rule of not receiving pain medications from other Healthcare Providers. 1. Exception #1 (Emergencies): In the event of an emergency (i.e.: accident requiring emergency care), you are allowed to receive additional pain medication. However, you are responsible for: As soon as you are able, call our office [phone], at any time of the day or night, and leave a message stating your  name, the date and nature of the emergency, and the name and dose of the medication   prescribed. In the event that your call is answered by a member of our staff, make sure to document and save the date, time, and the name of the person that took your information.  2. Exception #2 (Planned Surgery): In the event that you are scheduled by another doctor or dentist to have any type of surgery or procedure, you are allowed (for a period no longer than 30 days), to receive additional pain medication, for the acute post-op pain. However, in this case, you are responsible for picking up a copy of our "Post-op Pain Management for Surgeons" handout, and giving it to your surgeon or dentist. This document is available at our office, and does not require an appointment to obtain it. Simply go to our office during business hours (Monday-Thursday from 8:00 AM to 4:00 PM) (Friday 8:00 AM to 12:00 Noon) or if you have a scheduled appointment with us, prior to your surgery, and ask for it by name. In addition, you will need to provide us with your name, name of your surgeon, type of surgery, and date of procedure or surgery.  *Opioid medications include: morphine, codeine, oxycodone, oxymorphone, hydrocodone, hydromorphone, meperidine, tramadol, tapentadol, buprenorphine, fentanyl, methadone. **Benzodiazepine medications include: diazepam (Valium), alprazolam (Xanax), clonazepam (Klonopine), lorazepam (Ativan), clorazepate (Tranxene), chlordiazepoxide (Librium), estazolam (Prosom), oxazepam (Serax), temazepam (Restoril), triazolam (Halcion)  ____________________________________________________________________________________________  BMI Assessment: Estimated body mass index is 40.35 kg/m as calculated from the following:   Height as of this encounter: 5\' 6"  (1.676 m).   Weight as of this encounter: 250 lb (113.4 kg).  BMI interpretation table: BMI level Category Range association with higher incidence of chronic pain   <18 kg/m2 Underweight   18.5-24.9 kg/m2 Ideal body weight   25-29.9 kg/m2 Overweight Increased incidence by 20%  30-34.9 kg/m2 Obese (Class I) Increased incidence by 68%  35-39.9 kg/m2 Severe obesity (Class II) Increased incidence by 136%  >40 kg/m2 Extreme obesity (Class III) Increased incidence by 254%   BMI Readings from Last 4 Encounters:  12/07/16 40.35 kg/m  11/17/16 41.00 kg/m  10/10/16 38.95 kg/m  09/08/16 40.35 kg/m   Wt Readings from Last 4 Encounters:  12/07/16 250 lb (113.4 kg)  11/17/16 254 lb (115.2 kg)  10/10/16 245 lb (111.1 kg)  09/08/16 250 lb (113.4 kg)

## 2016-12-13 ENCOUNTER — Ambulatory Visit: Payer: BLUE CROSS/BLUE SHIELD | Attending: Nurse Practitioner | Admitting: Nurse Practitioner

## 2016-12-13 VITALS — BP 141/92 | HR 94 | Temp 98.6°F | Resp 20

## 2016-12-13 DIAGNOSIS — G894 Chronic pain syndrome: Secondary | ICD-10-CM | POA: Diagnosis present

## 2016-12-13 DIAGNOSIS — M545 Low back pain: Secondary | ICD-10-CM | POA: Insufficient documentation

## 2016-12-13 DIAGNOSIS — M533 Sacrococcygeal disorders, not elsewhere classified: Secondary | ICD-10-CM | POA: Diagnosis not present

## 2016-12-13 DIAGNOSIS — M79604 Pain in right leg: Secondary | ICD-10-CM | POA: Insufficient documentation

## 2016-12-13 NOTE — Progress Notes (Signed)
Nursing Pain Medication Assessment:  Safety precautions to be maintained throughout the outpatient stay will include: orient to surroundings, keep bed in low position, maintain call bell within reach at all times, provide assistance with transfer out of bed and ambulation.  Medication Inspection Compliance: Pill count conducted under aseptic conditions, in front of the patient. Neither the pills nor the bottle was removed from the patient's sight at any time. Once count was completed pills were immediately returned to the patient in their original bottle.  Medication: Oxycodone/APAP Pill/Patch Count: 27 of 120 pills remain Pill/Patch Appearance: Markings consistent with prescribed medication Bottle Appearance: Standard pharmacy container. Clearly labeled. Filled Date: 5910 / 31 / 2018 Last Medication intake:  Today

## 2016-12-13 NOTE — Patient Instructions (Signed)
Patient given 3 prescriptions for oxycodone.

## 2016-12-26 ENCOUNTER — Ambulatory Visit: Payer: BLUE CROSS/BLUE SHIELD | Admitting: Pain Medicine

## 2017-01-07 ENCOUNTER — Other Ambulatory Visit: Payer: Self-pay | Admitting: Pain Medicine

## 2017-01-22 NOTE — Progress Notes (Signed)
Patient's Name: Adam Petty.  MRN: [id number]  Referring Provider: No ref. provider found  DOB: 06/19/1960  PCP: Patient, No Pcp Per  DOS: 01/23/2017  Note by: Gaspar Cola, MD  Service setting: Ambulatory outpatient  Specialty: Interventional Pain Management  Location: [facility] (AMB) Pain Management Facility    Patient type: Established   Primary Reason(s) for Visit: Evaluation of chronic illnesses with exacerbation, or progression (Level of risk: moderate) CC: Back Pain (lower)  HPI  [name] is a 57 y.o. year old, male patient, who comes today for a follow-up evaluation. 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); Discogenic syndrome, lumbar (WC injury); Osteoarthritis of spine with radiculopathy, lumbar region (WC injury); Lumbar facet syndrome (WC injury) (Bilateral) (R>L); Lumbosacral radiculopathy (WC injury); Pain in right knee (WC injury); Obesity, Class III, BMI 40-49.9 (morbid obesity) ([place]); Uncomplicated opioid dependence ([place]); Therapeutic opioid-induced constipation (OIC); Dyslipidemia; Microalbuminuria; Adiposity; Lumbar canal stenosis; Type 2 diabetes mellitus ([place]); Long term prescription opiate use; Encounter for chronic pain management; Neurogenic pain; Neuropathic pain; Musculoskeletal pain; Lumbar spondylosis; Chronic knee pain (Secondary source of pain) (Right); Osteoarthritis of knee (Location of Secondary source of pain) (Right); Chronic shoulder pain [facility] source of pain) (Right); 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 [name] was last seen on 11/17/2016. His primarily concern today is the Back Pain (lower)  Pain Assessment: Location: Lower Back Radiating: right hip down back of leg to the knee. will sometimes radiateto top of right foot effects toes Onset: More than a month ago Duration: Chronic pain Quality: Aching, Stabbing, Discomfort, Jabbing Severity: 5 /10 (self-reported pain score)  Note: Reported level is inconsistent with clinical observations. Clinically the patient looks like a 3/10 A 3/10 is viewed as "Moderate" and described as significantly interfering with activities of daily living (ADL). It becomes difficult to feed, bathe, get dressed, get on and off the toilet or to perform personal hygiene functions. Difficult to get in and out of bed or a chair without assistance. Very distracting. With effort, it can be ignored when deeply involved in activities.       When using our objective Pain Scale, levels between 6 and 10/10 are said to belong in an emergency room, as it progressively worsens from a 6/10, described as severely limiting, requiring emergency care not usually available at an outpatient pain management facility. At a 6/10 level, communication becomes difficult and requires great effort. Assistance to reach the emergency department may be required. Facial flushing and profuse sweating along with potentially dangerous increases in heart rate and blood pressure will be evident. Effect on ADL: standing, bending, prolonged walking,seating Timing: Constant Modifying factors: heat, medications  The patient indicates that contrary to the initial bilateral sacroiliac joint block, this one did not provide him with his much relief and in fact it caused a lot of pain after the procedure.  In view of this, it is not likely that we will be doing radiofrequency of  this joint at this point.  In fact, review of  the patient's records would suggest that the pain may be discogenic in nature and in fact he may benefit from on IDET.   Further details on both, my assessment(s), as well as the proposed treatment plan, please see below.  Post-Procedure Assessment  11/17/2016 Procedure: Diagnostic bilateral sacroiliac joint block under fluoroscopic guidance and IV sedation Pre-procedure pain score:  7/10 Post-procedure pain score: 0/10 (100% relief) Influential Factors: BMI: 39.54 kg/m Intra-procedural challenges: None observed.         Assessment challenges: Results reported today are inconsistent with those reported on procedure day.        Before discharge, the patient had previously reported 100% relief of the pain. Reported side-effects: None.        Post-procedural adverse reactions or complications: None reported         Sedation: Sedation provided. When no sedatives are used, the analgesic levels obtained are directly associated to the effectiveness of the local anesthetics. However, when sedation is provided, the level of analgesia obtained during the initial 1 hour following the intervention, is believed to be the result of a combination of factors. These factors may include, but are not limited to: 1. The effectiveness of the local anesthetics used. 2. The effects of the analgesic(s) and/or anxiolytic(s) used. 3. The degree of discomfort experienced by the patient at the time of the procedure. 4. The patients ability and reliability in recalling and recording the events. 5. The presence and influence of possible secondary gains and/or psychosocial factors. Reported result: Relief experienced during the 1st hour after the procedure: 0 %(SI Joint) (Ultra-Short Term Relief)            Interpretative annotation: Unexpected result. Analgesia during this period is likely to be Local Anesthetic and/or IV Sedative (Analgesic/Anxiolytic) related. Such findings  would be suggestive of pain originating at a different site.  Effects of local anesthetic: The analgesic effects attained during this period are directly associated to the localized infiltration of local anesthetics and therefore cary significant diagnostic value as to the etiological location, or anatomical origin, of the pain. Expected duration of relief is directly dependent on the pharmacodynamics of the local anesthetic used. Long-acting (4-6 hours) anesthetics used.  Reported result: Relief during the next 4 to 6 hour after the procedure: 0 % (Short-Term Relief)            Interpretative annotation: Unexpected result. No analgesic effect would suggest pain etiology to reside elsewhere.          Long-term benefit: Defined as the period of time past the expected duration of local anesthetics (1 hour for short-acting and 4-6 hours for long-acting). With the possible exception of prolonged sympathetic blockade from the local anesthetics, benefits during this period are typically attributed to, or associated with, other factors such as analgesic sensory neuropraxia, antiinflammatory effects, or beneficial biochemical changes provided by agents other than the local anesthetics.  Reported result: Extended relief following procedure: 0 % (Long-Term Relief)            Interpretative annotation: Clinically possible results. No benefit. Therapeutic failure. No significant inflammatory component detected.          Current benefits: Defined as reported results that persistent at this point in time.   Analgesia: 0 %            Function: No improvement ROM: No improvement Interpretative annotation: No benefit. Therapeutic failure. Effective diagnostic intervention.          Interpretation: Results would suggest a  successful diagnostic intervention.                  Plan:  Re-assessment of pain etiology.        Laboratory Chemistry  Inflammation Markers (CRP: Acute Phase) (ESR: Chronic Phase) Lab  Results  Component Value Date   CRP 0.6 03/12/2015   ESRSEDRATE 9 03/12/2015                 Renal Function Markers Lab Results  Component Value Date   BUN 15 03/12/2015   CREATININE 0.82 03/12/2015   GFRAA >60 03/12/2015   GFRNONAA >60 03/12/2015                 Hepatic Function Markers Lab Results  Component Value Date   AST 41 03/12/2015   ALT 55 03/12/2015   ALBUMIN 4.2 03/12/2015   ALKPHOS 89 03/12/2015                 Electrolytes Lab Results  Component Value Date   NA 140 03/12/2015   K 4.2 03/12/2015   CL 105 03/12/2015   CALCIUM 10.0 03/12/2015   MG 1.8 03/12/2015                 Neuropathy Markers Lab Results  Component Value Date   HGBA1C 9.2 (H) 08/31/2012                 Coagulation Parameters Lab Results  Component Value Date   PLT 176 03/13/2015                 Cardiovascular Markers Lab Results  Component Value Date   CKTOTAL 184 08/31/2012   CKMB 1.1 08/31/2012   TROPONINI < 0.02 08/31/2012   HGB 15.3 03/13/2015   HCT 46.4 03/13/2015                 Note: Lab results reviewed.  Recent Diagnostic Imaging Review  Cervical Imaging: Cervical DG 1 view:  Results for orders placed in visit on 05/29/01  DG Cervical Spine 1 View   Narrative FINDINGS CLINICAL DATA:  POST OPERATIVE CERVICAL FUSION, NECK PAIN AND RIGHT SHOULDER PAIN. LATERAL CERVICAL SPINE LATERAL VIEW AND AN OVER PENETRATED LATERAL VIEW OF THE C7-T1 LEVEL SHOWS THE BONES ARE WELL ALIGNED WITH WELL POSITIONED BONE PLUG AT THE C6-7 INTERSPACE AND WELL POSITIONED ANTERIOR PLATE ATTACHED WITH WELL POSITIONED SCREWS AT C6 AND C7.  MODERATE ANTERIOR SPURRING AT C3-4.  THE PREVERTEBRAL SOFT TISSUES ARE SLIGHTLY PROMINENT IN REGION OF THE FUSION CONSISTENT WITH THE SURGERY APPROXIMATELY ONE MONTH AGO. IMPRESSION GOOD POSITION AND ALIGNMENT OF C6-7 FUSION.   Cervical DG 2-3 views:  Results for orders placed in visit on 05/03/01  DG Cervical Spine 2-3 Views   Narrative  FINDINGS CLINICAL DATA:    CERVICAL HNP. C-ARM FLUOROSCOPY PORTABLE INTRAOPERATIVE C-ARM WAS USED FOR 30-60 MINUTES. IMPRESSION PORTABLE INTRAOPERATIVE C-ARM. CERVICAL SPINE, 2-3 VIEWS SPOT INTRAOPERATIVE IMAGES SHOW POSTOPERATIVE CHANGES.  HOWEVER, THE AREA OF INTEREST IS NOT WELL DEMONSTRATED. IMPRESSION POSTOPERATIVE CHANGES.   Shoulder Imaging: Shoulder-R MR wo contrast:  Results for orders placed during the hospital encounter of 04/19/16  MR SHOULDER RIGHT WO CONTRAST   Narrative CLINICAL DATA:  Right shoulder pain since 01/29/2014. Decreased range of motion. No known injury. History of prior shoulder surgery.  EXAM: MRI OF THE RIGHT SHOULDER WITHOUT CONTRAST  TECHNIQUE: Multiplanar, multisequence MR imaging of the shoulder was performed. No intravenous contrast was administered.  COMPARISON:  MR arthrogram right  shoulder 06/04/2008.  FINDINGS: Rotator cuff: Postoperative change of rotator cuff repair is identified. Since the prior MRI, the patient has undergone a second procedure for rotator cuff repair. The rotator cuff is intact.  Muscles:  No atrophy or focal lesion.  Biceps long head: Status post biceps tenodesis without evidence of complication.  Acromioclavicular Joint: The joint has been resected as seen on the prior MRI. No complicating feature. The patient is status post acromioplasty. No fluid in the subacromial/subdeltoid bursa is identified.  Glenohumeral Joint: There has been progression of glenohumeral osteoarthritis with a new osteophyte off the inferior margin of the humeral head and progressive cartilage loss about the joint.  Labrum: The superior labrum is diminutive with an appearance most suggestive of prior debridement, unchanged.  Bones:  No fracture or worrisome lesion.  Other: None.  IMPRESSION: The rotator cuff is intact with postoperative change of repair identified. The patient is also status post biceps tenodesis  without evidence complication.  Progressive glenohumeral osteoarthritis since the prior MRI. Degenerative change appears moderate in degree.  Status post acromioplasty and resection of the [facility] joint without evidence of complication.   Electronically Signed   By: Inge Rise M.D.   On: 04/19/2016 10:39    Thoracic Imaging: Thoracic CT w/wo contrast:  Results for orders placed during the hospital encounter of 12/22/09  CT T Spine Ltd Wo Or W/ Cm   Narrative MYELOGRAM INJECTION   Technique:  Informed consent was obtained from the patient prior to the procedure, including potential complications of headache, allergy, infection and pain. Specific instructions were given regarding 24 hour bedrest post procedure to prevent post-LP headache.  A timeout procedure was performed.  With the patient prone, the lower back was prepped with Betadine.  1% Lidocaine was used for local anesthesia.  Lumbar puncture was performed by the radiologist at the L3-L4 level using a 22 gauge needle with return of clear CSF.  15 cc of Omnipaque 180 was injected into the subarachnoid space .   IMPRESSION: Successful injection of  intrathecal contrast for myelography.   MYELOGRAM LUMBAR   Clinical Data:  Low back pain radiating toward the flank and abdomen on the right.  Some right lower extremity pain as well.   Comparison: No comparison is available.  The patient did not bring the MR comparison study.   Findings: Good opacification lumbar subarachnoid space.  Severe disc space narrowing L4-5 with compression of the thecal sac and both L5 nerve roots.  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  With the patient standing, lateral flexion/extension views were obtained.  There is no dynamic instability.   Fluoroscopy Time: 2.20 minutes   IMPRESSION:    As above.   LIMITED LOWER  THORACIC MYELOGRAM:   The lower thoracic region was imaged.  No significant compressive lesion is seen.  There is a shallow ventral extradural defect at T9- 10.  Conus medullaris is intact.   CT MYELOGRAPHY LUMBAR SPINE   Technique: Multidetector CT imaging of the lumbar spine was performed following myelography.  Multiplanar CT image reconstructions were also generated.   Findings:  No prevertebral or paraspinous masses.   L1-2: Normal disc space.   L2-3: Normal disc space.  Congenitally short pedicles.   L3-4: Normal disc space.  Congenitally short pedicles.  Mild facet arthropathy.   L4-5: Advanced disc space narrowing.  Vacuum disc phenomenon. Broad-based central disc extrusion, right greater than left, with vacuum disc phenomenon extending downward on the right in the canal likely representing an extruded fragment.  Congenitally short pedicles.  Moderate facet hypertrophy.  There is significant spinal stenosis with compression of the thecal sac, and both L5 nerve roots, worse on the right.  There is multifactorial neural foraminal narrowing related to bony overgrowth and disc material, worse on the right where a large extraforaminal spur is noted. Right greater than left L4 nerve root encroachment is present.   L5-S1: No disc protrusion.  Moderate facet arthropathy.  No S1 nerve root encroachment in the canal.  Mild neural foraminal narrowing without clear-cut L5 nerve root encroachment.   IMPRESSION: The dominant finding is at L4-5 where central disc extrusion, short pedicles, facet hypertrophy, disc space narrowing, and bony overgrowth all combine to result in spinal stenosis and compression of the L4 and L5 nerve roots, right greater than left.  See comments above.   POST MYELOGRAM CT OF THE THORACIC SPINE FROM T8 DOWNWARD:   T8-9:  Disc space narrowing.  Osseous spurring on the right.  No compressive lesion.   T9-10:  Shallow  right greater than left protrusion.  Ligamentum flavum calcification.  No clear-cut nerve root compression or spinal stenosis.   T10-11:  Mild facet hypertrophy.  No disc protrusion.   T11-12:  Vacuum disc phenomenon.  No posterior disc protrusion. Mild facet hypertrophy.   T12-L1:  Normal.   IMPRESSION:  Relatively mild disc disease at T8-9 and  T9-10 as described.  No significant compressive lesion.  Provider: Elizabeth Sauer   Lumbosacral Imaging: Lumbar CT w contrast:  Results for orders placed during the hospital encounter of 12/22/09  CT Lumbar Spine W Contrast   Narrative MYELOGRAM INJECTION   Technique:  Informed consent was obtained from the patient prior to the procedure, including potential complications of headache, allergy, infection and pain. Specific instructions were given regarding 24 hour bedrest post procedure to prevent post-LP headache.  A timeout procedure was performed.  With the patient prone, the lower back was prepped with Betadine.  1% Lidocaine was used for local anesthesia.  Lumbar puncture was performed by the radiologist at the L3-L4 level using a 22 gauge needle with return of clear CSF.  15 cc of Omnipaque 180 was injected into the subarachnoid space .   IMPRESSION: Successful injection of  intrathecal contrast for myelography.   MYELOGRAM LUMBAR   Clinical Data:  Low back pain radiating toward the flank and abdomen on the right.  Some right lower extremity pain as well.   Comparison: No comparison is available.  The patient did not bring the MR comparison study.   Findings: Good opacification lumbar subarachnoid space.  Severe disc space narrowing L4-5 with compression of the thecal sac and both L5 nerve roots.  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  With the patient standing, lateral  flexion/extension views were obtained.  There is no dynamic instability.   Fluoroscopy Time: 2.20 minutes   IMPRESSION:   As above.   LIMITED LOWER  THORACIC MYELOGRAM:   The lower thoracic region was imaged.  No significant compressive lesion is seen.  There is a shallow ventral extradural defect at T9- 10.  Conus medullaris is intact.   CT MYELOGRAPHY LUMBAR SPINE   Technique: Multidetector CT imaging  of the lumbar spine was performed following myelography.  Multiplanar CT image reconstructions were also generated.   Findings:  No prevertebral or paraspinous masses.   L1-2: Normal disc space.   L2-3: Normal disc space.  Congenitally short pedicles.   L3-4: Normal disc space.  Congenitally short pedicles.  Mild facet arthropathy.   L4-5: Advanced disc space narrowing.  Vacuum disc phenomenon. Broad-based central disc extrusion, right greater than left, with vacuum disc phenomenon extending downward on the right in the canal likely representing an extruded fragment.  Congenitally short pedicles.  Moderate facet hypertrophy.  There is significant spinal stenosis with compression of the thecal sac, and both L5 nerve roots, worse on the right.  There is multifactorial neural foraminal narrowing related to bony overgrowth and disc material, worse on the right where a large extraforaminal spur is noted. Right greater than left L4 nerve root encroachment is present.   L5-S1: No disc protrusion.  Moderate facet arthropathy.  No S1 nerve root encroachment in the canal.  Mild neural foraminal narrowing without clear-cut L5 nerve root encroachment.   IMPRESSION: The dominant finding is at L4-5 where central disc extrusion, short pedicles, facet hypertrophy, disc space narrowing, and bony overgrowth all combine to result in spinal stenosis and compression of the L4 and L5 nerve roots, right greater than left.  See comments above.   POST MYELOGRAM CT OF THE THORACIC SPINE FROM  T8 DOWNWARD:   T8-9:  Disc space narrowing.  Osseous spurring on the right.  No compressive lesion.   T9-10:  Shallow right greater than left protrusion.  Ligamentum flavum calcification.  No clear-cut nerve root compression or spinal stenosis.   T10-11:  Mild facet hypertrophy.  No disc protrusion.   T11-12:  Vacuum disc phenomenon.  No posterior disc protrusion. Mild facet hypertrophy.   T12-L1:  Normal.   IMPRESSION:  Relatively mild disc disease at T8-9 and  T9-10 as described.  No significant compressive lesion.  Provider: Elizabeth Sauer   Lumbar DG 2-3 views:  Results for orders placed during the hospital encounter of 01/31/13  X-ray lumbar spine AP and lateral   Narrative CLINICAL DATA:  PREOP LUMBAR DECOMPRESSION  EXAM: LUMBAR SPINE - 2-3 VIEW  COMPARISON:  None.  FINDINGS: There are 5 nonrib bearing lumbar-type vertebral bodies. The vertebral body heights are maintained. The alignment is anatomic. There is no spondylolysis. There is no acute fracture or static listhesis. There is severe degenerative disc disease at L4-5 with facet arthropathy.  The SI joints are unremarkable.  IMPRESSION: Severe degenerative disc disease at L4-5.   Electronically Signed   By: Kathreen Devoid   On: 01/31/2013 16:06    Lumbar DG Bending views:  Results for orders placed during the hospital encounter of 08/25/16  DG Lumbar Spine Complete W/Bend   Narrative CLINICAL DATA:  Chronic low back pain after fall 6 years ago.  EXAM: LUMBAR SPINE - COMPLETE WITH BENDING VIEWS  COMPARISON:  Radiographs of July 13, 2013.  FINDINGS: No fracture or spondylolisthesis is noted. Severe degenerative disc disease is noted at T12-L1 and L4-5. No change in vertebral body alignment is noted on the flexion or extension views. Posterior facet joints are unremarkable.  IMPRESSION: Multilevel degenerative disc disease. No acute abnormality seen in the lumbar spine.   Electronically  Signed   By: Marijo Conception, M.D.   On: 08/25/2016 12:01    Lumbar DG Myelogram views:  Results for orders placed during the hospital encounter of 12/22/09  DG Myelogram Lumbar   Narrative MYELOGRAM INJECTION   Technique:  Informed consent was obtained from the patient prior to the procedure, including potential complications of headache, allergy, infection and pain. Specific instructions were given regarding 24 hour bedrest post procedure to prevent post-LP headache.  A timeout procedure was performed.  With the patient prone, the lower back was prepped with Betadine.  1% Lidocaine was used for local anesthesia.  Lumbar puncture was performed by the radiologist at the L3-L4 level using a 22 gauge needle with return of clear CSF.  15 cc of Omnipaque 180 was injected into the subarachnoid space .   IMPRESSION: Successful injection of  intrathecal contrast for myelography.   MYELOGRAM LUMBAR   Clinical Data:  Low back pain radiating toward the flank and abdomen on the right.  Some right lower extremity pain as well.   Comparison: No comparison is available.  The patient did not bring the MR comparison study.   Findings: Good opacification lumbar subarachnoid space.  Severe disc space narrowing L4-5 with compression of the thecal sac and both L5 nerve roots.  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  With the patient standing, lateral flexion/extension views were obtained.  There is no dynamic instability.   Fluoroscopy Time: 2.20 minutes   IMPRESSION:   As above.   LIMITED LOWER  THORACIC MYELOGRAM:   The lower thoracic region was imaged.  No significant compressive lesion is seen.  There is a shallow ventral extradural defect at T9- 10.  Conus medullaris is intact.   CT MYELOGRAPHY LUMBAR SPINE   Technique: Multidetector  CT imaging of the lumbar spine was performed following myelography.  Multiplanar CT image reconstructions were also generated.   Findings:  No prevertebral or paraspinous masses.   L1-2: Normal disc space.   L2-3: Normal disc space.  Congenitally short pedicles.   L3-4: Normal disc space.  Congenitally short pedicles.  Mild facet arthropathy.   L4-5: Advanced disc space narrowing.  Vacuum disc phenomenon. Broad-based central disc extrusion, right greater than left, with vacuum disc phenomenon extending downward on the right in the canal likely representing an extruded fragment.  Congenitally short pedicles.  Moderate facet hypertrophy.  There is significant spinal stenosis with compression of the thecal sac, and both L5 nerve roots, worse on the right.  There is multifactorial neural foraminal narrowing related to bony overgrowth and disc material, worse on the right where a large extraforaminal spur is noted. Right greater than left L4 nerve root encroachment is present.   L5-S1: No disc protrusion.  Moderate facet arthropathy.  No S1 nerve root encroachment in the canal.  Mild neural foraminal narrowing without clear-cut L5 nerve root encroachment.   IMPRESSION: The dominant finding is at L4-5 where central disc extrusion, short pedicles, facet hypertrophy, disc space narrowing, and bony overgrowth all combine to result in spinal stenosis and compression of the L4 and L5 nerve roots, right greater than left.  See comments above.   POST MYELOGRAM CT OF THE THORACIC SPINE FROM T8 DOWNWARD:   T8-9:  Disc space narrowing.  Osseous spurring on the right.  No compressive lesion.   T9-10:  Shallow right greater than left protrusion.  Ligamentum flavum calcification.  No clear-cut nerve root compression or spinal stenosis.   T10-11:  Mild facet hypertrophy.  No disc protrusion.   T11-12:  Vacuum disc phenomenon.  No posterior disc protrusion. Mild facet hypertrophy.    T12-L1:  Normal.   IMPRESSION:  Relatively mild disc disease at T8-9 and  T9-10 as described.  No significant compressive lesion.  Provider: [facility]   Sacroiliac Joint Imaging: Sacroiliac Joint DG:  Results for orders placed during the hospital encounter of 08/25/16  DG Si Joints   Narrative CLINICAL DATA:  Chronic low back pain after injury 6 years ago.  EXAM: BILATERAL SACROILIAC JOINTS - 3+ VIEW  COMPARISON:  None.  FINDINGS: The sacroiliac joint spaces are maintained and there is no evidence of arthropathy. No other bone abnormalities are seen.  IMPRESSION: Normal sacroiliac joints.   Electronically Signed   By: Marijo Conception, M.D.   On: 08/25/2016 11:58    Complexity Note: Imaging results reviewed. Results shared with Mr. Huebert, using Layman's terms.                         Meds   Current Outpatient Medications:  .  cyanocobalamin (,VITAMIN B-12,) 1000 MCG/ML injection, Inject into the muscle every 30 (thirty) days. , Disp: , Rfl:  .  gabapentin (NEURONTIN) 600 MG tablet, Take 1 tablet (600 mg total) by mouth every 6 (six) hours., Disp: 360 tablet, Rfl: 0 .  insulin degludec (TRESIBA FLEXTOUCH) 100 UNIT/ML SOPN FlexTouch Pen, Inject 40 Units into the skin daily. , Disp: , Rfl:  .  ketoconazole (NIZORAL) 2 % shampoo, APPLY TO AFFECTED AREA EVERY DAY AS NEEDED, Disp: , Rfl: 4 .  liraglutide (VICTOZA) 18 MG/3ML SOPN, INJECT 0.3 MLS (1.8 MG TOTAL) SUBCUTANEOUSLY ONCE DAILY., Disp: , Rfl:  .  metFORMIN (GLUCOPHAGE) 1000 MG tablet, TAKE 1 TABLET (1,000 MG TOTAL) BY MOUTH 2 (TWO) TIMES DAILY WITH MEALS., Disp: , Rfl: 1 .  NOVOLOG FLEXPEN 100 UNIT/ML FlexPen, Inject 0-10 Units into the skin daily as needed for high blood sugar (sliding scale). Reported on 02/24/2015, Disp: , Rfl: 0 .  [START ON 02/06/2017] oxyCODONE (OXY IR/ROXICODONE) 5 MG immediate release tablet, Take 1 tablet (5 mg total) by mouth every 6 (six) hours as needed for severe pain., Disp: 120  tablet, Rfl: 0 .  oxyCODONE (OXY IR/ROXICODONE) 5 MG immediate release tablet, Take 1 tablet (5 mg total) by mouth every 6 (six) hours as needed for severe pain., Disp: 120 tablet, Rfl: 0 .  potassium citrate (UROCIT-K) 10 MEQ (1080 MG) SR tablet, Take 10 mEq by mouth 2 (two) times daily., Disp: , Rfl:  .  rosuvastatin (CRESTOR) 5 MG tablet, Take 5 mg by mouth 3 (three) times a week. Monday Wednesday Friday, Disp: , Rfl:  .  oxyCODONE (OXY IR/ROXICODONE) 5 MG immediate release tablet, Take 1 tablet (5 mg total) by mouth every 6 (six) hours as needed for severe pain., Disp: 120 tablet, Rfl: 0  ROS  Constitutional: Denies any fever or chills Gastrointestinal: No reported hemesis, hematochezia, vomiting, or acute GI distress Musculoskeletal: Denies any acute onset joint swelling, redness, loss of ROM, or weakness Neurological: No reported episodes of acute onset apraxia, aphasia, dysarthria, agnosia, amnesia, paralysis, loss of coordination, or loss of consciousness  Allergies  Mr. Mallari has No Known Allergies.  [place]  Drug: Mr. Marple  reports that he does not use drugs. Alcohol:  reports that he drinks alcohol. Tobacco:  reports that  has never smoked. he has never used smokeless tobacco. 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 Surgical: Mr. Farish  has a past surgical history that includes Tonsillectomy; Shoulder surgery; Palate surgery; Cervical fusion; Foot surgery; Lumbar laminectomy/decompression microdiscectomy (N/A, 02/06/2013); Knee arthroscopy (Right, 10/02/2013); Knee arthroscopy with medial menisectomy (Right, 03/25/2015); Knee bursectomy (Right, 03/25/2015); and Cystoscopy w/ retrogrades (Right, 11/19/2015). Family: family  history includes COPD in his mother; Cancer in his mother; Diabetes in his father; Heart disease in his father and mother.  Constitutional Exam  General appearance: Well nourished, well developed, and well hydrated. In no apparent acute distress Vitals:   01/23/17 0755  BP: 139/77  Pulse: 93  Resp: 16  Temp: 98.6 F (37 C)  SpO2: 98%  Weight: 245 lb (111.1 kg)  Height: _0  (1.676 m)   BMI Assessment: Estimated body mass index is 39.54 kg/m as calculated from the following:   Height as of this encounter: _1  (1.676 m).   Weight as of this encounter: 245 lb (111.1 kg).  BMI interpretation table: BMI level Category Range association with higher incidence of chronic pain  <18 kg/m2 Underweight   18.5-24.9 kg/m2 Ideal body weight   25-29.9 kg/m2 Overweight Increased incidence by 20%  30-34.9 kg/m2 Obese (Class I) Increased incidence by 68%  35-39.9 kg/m2 Severe obesity (Class II) Increased incidence by 136%  >40 kg/m2 Extreme obesity (Class III) Increased incidence by 254%   BMI Readings from Last 4 Encounters:  01/23/17 39.54 kg/m  12/07/16 40.35 kg/m  11/17/16 41.00 kg/m  10/10/16 38.95 kg/m   Wt Readings from Last 4 Encounters:  01/23/17 245 lb (111.1 kg)  12/07/16 250 lb (113.4 kg)  11/17/16 254 lb (115.2 kg)  10/10/16 245 lb (111.1 kg)  Psych/Mental status: Alert, oriented x 3 (person, place, & time)       Eyes: PERLA Respiratory: No evidence of acute respiratory distress  Cervical Spine Area Exam  Skin & Axial Inspection: No masses, redness, edema, swelling, or associated skin lesions Alignment: Symmetrical Functional ROM: Unrestricted ROM      Stability: No instability detected Muscle Tone/Strength: Functionally intact. No obvious neuro-muscular anomalies detected. Sensory (Neurological): Unimpaired Palpation: No palpable anomalies              Upper Extremity (UE) Exam    Side: Right upper extremity  Side: Left upper extremity  Skin & Extremity  Inspection: Skin color, temperature, and hair growth are WNL. No peripheral edema or cyanosis. No masses, redness, swelling, asymmetry, or associated skin lesions. No contractures.  Skin & Extremity Inspection: Skin color, temperature, and hair growth are WNL. No peripheral edema or cyanosis. No masses, redness, swelling, asymmetry, or associated skin lesions. No contractures.  Functional ROM: Unrestricted ROM          Functional ROM: Unrestricted ROM          Muscle Tone/Strength: Functionally intact. No obvious neuro-muscular anomalies detected.  Muscle Tone/Strength: Functionally intact. No obvious neuro-muscular anomalies detected.  Sensory (Neurological): Unimpaired          Sensory (Neurological): Unimpaired          Palpation: No palpable anomalies              Palpation: No palpable anomalies              Specialized Test(s): Deferred         Specialized Test(s): Deferred  Thoracic Spine Area Exam  Skin & Axial Inspection: No masses, redness, or swelling Alignment: Symmetrical Functional ROM: Unrestricted ROM Stability: No instability detected Muscle Tone/Strength: Functionally intact. No obvious neuro-muscular anomalies detected. Sensory (Neurological): Unimpaired Muscle strength & Tone: No palpable anomalies  Lumbar Spine Area Exam  Skin & Axial Inspection: No masses, redness, or swelling Alignment: Symmetrical Functional ROM: Minimal ROM      Stability: No instability detected Muscle Tone/Strength: Functionally intact. No obvious neuro-muscular anomalies detected. Sensory (Neurological): Unimpaired Palpation: No palpable anomalies       Provocative Tests: Lumbar Hyperextension and rotation test: evaluation deferred today       Lumbar Lateral bending test: evaluation deferred today       Patrick's Maneuver: evaluation deferred today                    Gait & Posture Assessment  Ambulation: Unassisted Gait: Antalgic Posture: Antalgic   Lower Extremity Exam    Side:  Right lower extremity  Side: Left lower extremity  Skin & Extremity Inspection: Skin color, temperature, and hair growth are WNL. No peripheral edema or cyanosis. No masses, redness, swelling, asymmetry, or associated skin lesions. No contractures.  Skin & Extremity Inspection: Skin color, temperature, and hair growth are WNL. No peripheral edema or cyanosis. No masses, redness, swelling, asymmetry, or associated skin lesions. No contractures.  Functional ROM: Unrestricted ROM          Functional ROM: Unrestricted ROM          Muscle Tone/Strength: Functionally intact. No obvious neuro-muscular anomalies detected.  Muscle Tone/Strength: Functionally intact. No obvious neuro-muscular anomalies detected.  Sensory (Neurological): Unimpaired  Sensory (Neurological): Unimpaired  Palpation: No palpable anomalies  Palpation: No palpable anomalies   Assessment  Primary Diagnosis & Pertinent Problem List: The primary encounter diagnosis was Chronic pain syndrome (WC injury). Diagnoses of Chronic low back pain (WC injury) (Primary Source of Pain) (Bilateral) (R>L), Lumbar spondylosis, Chronic lower extremity pain (Right), and Chronic knee pain (Secondary source of pain) (Right) were also pertinent to this visit.  Status Diagnosis  Persistent Not improving Stable 1. Chronic pain syndrome (WC injury)   2. Chronic low back pain (WC injury) (Primary Source of Pain) (Bilateral) (R>L)   3. Lumbar spondylosis   4. Chronic lower extremity pain (Right)   5. Chronic knee pain (Secondary source of pain) (Right)     Problems updated and reviewed during this visit: No problems updated. Plan of Care  Pharmacotherapy (Medications Ordered): No orders of the defined types were placed in this encounter.  Medications administered today: [name]. "Pilar Plate" had no medications administered during this visit.  Procedure Orders    No procedure(s) ordered today   Lab Orders  No laboratory test(s) ordered  today   Imaging Orders  No imaging studies ordered today   Referral Orders  No referral(s) requested today    Interventional management options: Planned, scheduled, and/or pending:   Possible repeat MRI.  The last one was done on 2016.  Since then he has been experiencing more right lower extremity pain.  He compares this to the type of pain that he had prior to the surgery of his lumbar spine.  He is also experiencing pain in the center of the lower back, possibly discogenic in nature.  Depending on the results of the MRI (which we would order with and without contrast) then we may need to consider a diagnostic discogram with the possibility of following up with  [facility].   Considering:   Palliativebilateral lumbar facet block Palliativebilateral lumbar facet radiofrequencyablation (Right side done 04/13/2016) (100% relief) Diagnostic right knee genicular nerve block.  Possible right knee genicular nerve radiofrequencyablation   Palliative PRN treatment(s):   Diagnostic Right Suprascapular nerve Block#2 Possible Right Suprascapular nerveRFA   Provider-requested follow-up: Return for Med-Mgmt, w/ Dionisio David, NP.  Future Appointments  Date Time Provider [place]  03/06/2017  8:30 AM Vevelyn Francois, NP [facility] None   Primary Care Physician: Patient, No Pcp Per Location: [facility] Note by: Gaspar Cola, MD Date: 01/23/2017; Time: 12:27 PM

## 2017-01-23 ENCOUNTER — Other Ambulatory Visit: Payer: Self-pay

## 2017-01-23 ENCOUNTER — Ambulatory Visit: Payer: BLUE CROSS/BLUE SHIELD | Attending: Pain Medicine | Admitting: Pain Medicine

## 2017-01-23 ENCOUNTER — Encounter: Payer: Self-pay | Admitting: Pain Medicine

## 2017-01-23 VITALS — BP 139/77 | HR 93 | Temp 98.6°F | Resp 16 | Ht 66.0 in | Wt 245.0 lb

## 2017-01-23 DIAGNOSIS — Z79891 Long term (current) use of opiate analgesic: Secondary | ICD-10-CM | POA: Diagnosis not present

## 2017-01-23 DIAGNOSIS — M47816 Spondylosis without myelopathy or radiculopathy, lumbar region: Secondary | ICD-10-CM | POA: Diagnosis not present

## 2017-01-23 DIAGNOSIS — E785 Hyperlipidemia, unspecified: Secondary | ICD-10-CM | POA: Insufficient documentation

## 2017-01-23 DIAGNOSIS — M4726 Other spondylosis with radiculopathy, lumbar region: Secondary | ICD-10-CM | POA: Diagnosis not present

## 2017-01-23 DIAGNOSIS — Z794 Long term (current) use of insulin: Secondary | ICD-10-CM | POA: Diagnosis not present

## 2017-01-23 DIAGNOSIS — Z809 Family history of malignant neoplasm, unspecified: Secondary | ICD-10-CM | POA: Diagnosis not present

## 2017-01-23 DIAGNOSIS — Z9889 Other specified postprocedural states: Secondary | ICD-10-CM | POA: Diagnosis not present

## 2017-01-23 DIAGNOSIS — G8929 Other chronic pain: Secondary | ICD-10-CM

## 2017-01-23 DIAGNOSIS — G894 Chronic pain syndrome: Secondary | ICD-10-CM | POA: Diagnosis present

## 2017-01-23 DIAGNOSIS — M5124 Other intervertebral disc displacement, thoracic region: Secondary | ICD-10-CM | POA: Insufficient documentation

## 2017-01-23 DIAGNOSIS — Z8249 Family history of ischemic heart disease and other diseases of the circulatory system: Secondary | ICD-10-CM | POA: Insufficient documentation

## 2017-01-23 DIAGNOSIS — M79604 Pain in right leg: Secondary | ICD-10-CM | POA: Diagnosis not present

## 2017-01-23 DIAGNOSIS — M545 Low back pain, unspecified: Secondary | ICD-10-CM

## 2017-01-23 DIAGNOSIS — N200 Calculus of kidney: Secondary | ICD-10-CM | POA: Diagnosis not present

## 2017-01-23 DIAGNOSIS — Z833 Family history of diabetes mellitus: Secondary | ICD-10-CM | POA: Diagnosis not present

## 2017-01-23 DIAGNOSIS — M25561 Pain in right knee: Secondary | ICD-10-CM

## 2017-01-23 DIAGNOSIS — Z79899 Other long term (current) drug therapy: Secondary | ICD-10-CM | POA: Diagnosis not present

## 2017-01-23 DIAGNOSIS — Z825 Family history of asthma and other chronic lower respiratory diseases: Secondary | ICD-10-CM | POA: Insufficient documentation

## 2017-01-23 DIAGNOSIS — M7918 Myalgia, other site: Secondary | ICD-10-CM | POA: Insufficient documentation

## 2017-01-23 DIAGNOSIS — K589 Irritable bowel syndrome without diarrhea: Secondary | ICD-10-CM | POA: Insufficient documentation

## 2017-01-23 DIAGNOSIS — G4730 Sleep apnea, unspecified: Secondary | ICD-10-CM | POA: Insufficient documentation

## 2017-01-23 DIAGNOSIS — Z87442 Personal history of urinary calculi: Secondary | ICD-10-CM | POA: Insufficient documentation

## 2017-01-23 DIAGNOSIS — M48061 Spinal stenosis, lumbar region without neurogenic claudication: Secondary | ICD-10-CM | POA: Insufficient documentation

## 2017-01-23 DIAGNOSIS — E119 Type 2 diabetes mellitus without complications: Secondary | ICD-10-CM | POA: Diagnosis not present

## 2017-01-23 NOTE — Progress Notes (Signed)
Safety precautions to be maintained throughout the outpatient stay will include: orient to surroundings, keep bed in low position, maintain call bell within reach at all times, provide assistance with transfer out of bed and ambulation.  

## 2017-03-06 ENCOUNTER — Ambulatory Visit: Payer: BLUE CROSS/BLUE SHIELD | Attending: Nurse Practitioner | Admitting: Nurse Practitioner

## 2017-03-06 ENCOUNTER — Encounter: Payer: Self-pay | Admitting: Nurse Practitioner

## 2017-03-06 ENCOUNTER — Other Ambulatory Visit: Payer: Self-pay

## 2017-03-06 VITALS — BP 136/66 | HR 74 | Temp 98.0°F | Ht 66.0 in | Wt 245.0 lb

## 2017-03-06 DIAGNOSIS — Z87442 Personal history of urinary calculi: Secondary | ICD-10-CM | POA: Diagnosis not present

## 2017-03-06 DIAGNOSIS — G8929 Other chronic pain: Secondary | ICD-10-CM | POA: Diagnosis not present

## 2017-03-06 DIAGNOSIS — Z794 Long term (current) use of insulin: Secondary | ICD-10-CM | POA: Diagnosis not present

## 2017-03-06 DIAGNOSIS — M47816 Spondylosis without myelopathy or radiculopathy, lumbar region: Secondary | ICD-10-CM

## 2017-03-06 DIAGNOSIS — Z825 Family history of asthma and other chronic lower respiratory diseases: Secondary | ICD-10-CM | POA: Diagnosis not present

## 2017-03-06 DIAGNOSIS — G894 Chronic pain syndrome: Secondary | ICD-10-CM | POA: Diagnosis not present

## 2017-03-06 DIAGNOSIS — Z79891 Long term (current) use of opiate analgesic: Secondary | ICD-10-CM | POA: Diagnosis not present

## 2017-03-06 DIAGNOSIS — Z8249 Family history of ischemic heart disease and other diseases of the circulatory system: Secondary | ICD-10-CM | POA: Diagnosis not present

## 2017-03-06 DIAGNOSIS — Z833 Family history of diabetes mellitus: Secondary | ICD-10-CM | POA: Diagnosis not present

## 2017-03-06 DIAGNOSIS — Z6839 Body mass index (BMI) 39.0-39.9, adult: Secondary | ICD-10-CM | POA: Diagnosis not present

## 2017-03-06 DIAGNOSIS — Z5181 Encounter for therapeutic drug level monitoring: Secondary | ICD-10-CM | POA: Diagnosis not present

## 2017-03-06 DIAGNOSIS — G4730 Sleep apnea, unspecified: Secondary | ICD-10-CM | POA: Diagnosis not present

## 2017-03-06 DIAGNOSIS — E119 Type 2 diabetes mellitus without complications: Secondary | ICD-10-CM | POA: Diagnosis not present

## 2017-03-06 DIAGNOSIS — M792 Neuralgia and neuritis, unspecified: Secondary | ICD-10-CM

## 2017-03-06 DIAGNOSIS — Z79899 Other long term (current) drug therapy: Secondary | ICD-10-CM | POA: Insufficient documentation

## 2017-03-06 DIAGNOSIS — M545 Low back pain: Secondary | ICD-10-CM | POA: Diagnosis present

## 2017-03-06 DIAGNOSIS — M961 Postlaminectomy syndrome, not elsewhere classified: Secondary | ICD-10-CM | POA: Insufficient documentation

## 2017-03-06 DIAGNOSIS — M4726 Other spondylosis with radiculopathy, lumbar region: Secondary | ICD-10-CM | POA: Diagnosis not present

## 2017-03-06 DIAGNOSIS — M19011 Primary osteoarthritis, right shoulder: Secondary | ICD-10-CM | POA: Insufficient documentation

## 2017-03-06 DIAGNOSIS — K589 Irritable bowel syndrome without diarrhea: Secondary | ICD-10-CM | POA: Diagnosis not present

## 2017-03-06 DIAGNOSIS — M533 Sacrococcygeal disorders, not elsewhere classified: Secondary | ICD-10-CM | POA: Diagnosis not present

## 2017-03-06 DIAGNOSIS — E785 Hyperlipidemia, unspecified: Secondary | ICD-10-CM | POA: Diagnosis not present

## 2017-03-06 DIAGNOSIS — M79604 Pain in right leg: Secondary | ICD-10-CM | POA: Diagnosis not present

## 2017-03-06 DIAGNOSIS — Z981 Arthrodesis status: Secondary | ICD-10-CM | POA: Insufficient documentation

## 2017-03-06 DIAGNOSIS — M48061 Spinal stenosis, lumbar region without neurogenic claudication: Secondary | ICD-10-CM | POA: Diagnosis not present

## 2017-03-06 DIAGNOSIS — E669 Obesity, unspecified: Secondary | ICD-10-CM | POA: Diagnosis not present

## 2017-03-06 MED ORDER — OXYCODONE HCL 5 MG PO TABS: 5.0000 mg | ORAL_TABLET | Freq: Four times a day (QID) | ORAL | 0 refills | Status: DC | PRN

## 2017-03-06 MED ORDER — GABAPENTIN 600 MG PO TABS: 600.0000 mg | ORAL_TABLET | Freq: Four times a day (QID) | ORAL | 0 refills | Status: DC

## 2017-03-06 NOTE — Progress Notes (Signed)
Patient's Name: Adam Petty.  MRN: [id number]  Referring Provider: No ref. provider found  DOB: 1960-09-24  PCP: Patient, No Pcp Per  DOS: 03/06/2017  Note by: Vevelyn Francois NP  Service setting: Ambulatory outpatient  Specialty: Interventional Pain Management  Location: [facility] (AMB) Pain Management Facility    Patient type: Established    Primary Reason(s) for Visit: Encounter for prescription drug management. (Level of risk: moderate)  CC: Back Pain (Lower back right shoulder, right knee)  HPI  [name] is a 57 y.o. year old, male patient, who comes today for a medication management evaluation. 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); Discogenic syndrome, lumbar (WC injury); Osteoarthritis of spine with radiculopathy, lumbar region (WC injury); Lumbar facet syndrome (WC injury) (Bilateral) (R>L); Lumbosacral radiculopathy (WC injury); Pain in right knee (WC injury); Obesity, Class III, BMI 40-49.9 (morbid obesity) ([place]); Uncomplicated opioid dependence ([place]); Therapeutic opioid-induced constipation (OIC); Dyslipidemia; Microalbuminuria; Adiposity; Lumbar canal stenosis; Type 2 diabetes mellitus ([place]); Long term prescription opiate use; Encounter for chronic pain management; Neurogenic pain; Neuropathic pain; Musculoskeletal pain; Lumbar spondylosis; Chronic knee pain (Secondary source of pain) (Right); Osteoarthritis of knee (Location of Secondary source of pain) (Right); Chronic shoulder pain [facility] source of pain) (Right); 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 His primarily concern today is the Back Pain (Lower back right shoulder, right knee)  Pain Assessment: Location: Lower Back Radiating: Radiates down right shoulder to right knee Onset: More than a month ago Duration: Chronic pain Quality: Stabbing, Sharp, Shooting, Constant, Aching Severity: 5 /10 (self-reported pain score)  Note: Reported level is compatible with observation.                          Effect on ADL:   Timing: Constant Modifying factors: meds, heat  Adam Petty was last scheduled for an appointment on 12/13/2016 for medication management. During today's appointment we reviewed Adam Petty chronic pain status, as well as his outpatient medication regimen. He has not had the MRI completed. He states that he was informed that it was based on him if this would be completed. He admits that he does not have the copay for this at this time. He has numbness and tingling in the right leg and into his foot. He denies any weakness.   The patient  reports that he does not use drugs. His body mass index is 39.54 kg/m.  Further details on both, my assessment(s), as well as the proposed treatment plan, please see below.  Controlled Substance Pharmacotherapy Assessment REMS (Risk Evaluation and Mitigation Strategy)  Analgesic: Oxycodone IR 5 mg 1 tablet by mouth every 6 hours (20 mg/day)  MME/day: 30 mg/day    Adam Fischer, RN  03/06/2017  9:09 AM  Sign at close encounter Nursing Pain Medication Assessment:  Safety precautions to be maintained throughout the outpatient stay will include: orient to surroundings, keep bed in low position, maintain call bell within reach at all times, provide assistance with  transfer out of bed and ambulation.  Medication Inspection Compliance: Pill count conducted under aseptic conditions, in front of  the patient. Neither the pills nor the bottle was removed from the patient's sight at any time. Once count was completed pills were immediately returned to the patient in their original bottle.  Medication: Oxycodone IR Pill/Patch Count: 78 of 120 pills remain Pill/Patch Appearance: Markings consistent with prescribed medication Bottle Appearance: Standard pharmacy container. Clearly labeled. Filled Date: 2 / 4 / 2019 Last Medication intake:  Today   Pharmacokinetics: Liberation and absorption (onset of action): WNL Distribution (time to peak effect): WNL Metabolism and excretion (duration of action): WNL         Pharmacodynamics: Desired effects: Analgesia: Adam Petty reports >50% benefit. Functional ability: Patient reports that medication allows him to accomplish basic ADLs Clinically meaningful improvement in function (CMIF): Sustained CMIF goals met Perceived effectiveness: Described as relatively effective, allowing for increase in activities of daily living (ADL) Undesirable effects: Side-effects or Adverse reactions: None reported Monitoring: [place] PMP: Online review of the past 74-monthperiod conducted. Compliant with practice rules and regulations Last UDS on record: Summary  Date Value Ref Range Status  08/25/2016 FINAL  Final    Comment:    ==================================================================== TOXASSURE SELECT 13 (MW) ==================================================================== Test                             Result       Flag       Units Drug Present and Declared for Prescription Verification   Oxycodone                      786          EXPECTED   ng/mg creat   Oxymorphone                    1124         EXPECTED   ng/mg creat   Noroxycodone                   1522         EXPECTED   ng/mg creat   Noroxymorphone                 250          EXPECTED   ng/mg creat    Sources of oxycodone are scheduled prescription medications.    Oxymorphone,  noroxycodone, and noroxymorphone are expected    metabolites of oxycodone. Oxymorphone is also available as a    scheduled prescription medication. Drug Absent but Declared for Prescription Verification   Tapentadol                     Not Detected UNEXPECTED ng/mg creat ==================================================================== Test                      Result    Flag   Units      Ref Range   Creatinine              113              mg/dL      >=20 ==================================================================== Declared Medications:  The flagging and interpretation on this report are based on the  following declared medications.  Unexpected results may arise from  inaccuracies in the declared medications.  **Note: The testing scope of  this panel includes these medications:  Oxycodone (Oxy IR)  Oxycodone (Roxicodone)  Tapentadol  **Note: The testing scope of this panel does not include following  reported medications:  Gabapentin (Neurontin)  Insulin (NovoLog)  Insulin Tyler Aas)  Ketoconazole (Nizoral)  Liraglutide  Metformin (Glucophage)  Phentermine  Potassium Citrate (Urocit-K)  Ramipril (Altace)  Rosuvastatin (Crestor)  Vitamin B12 ==================================================================== For clinical consultation, please call [phone]. ====================================================================    UDS interpretation: Compliant          Medication Assessment Form: Reviewed. Patient indicates being compliant with therapy Treatment compliance: Compliant Risk Assessment Profile: Aberrant behavior: See prior evaluations. None observed or detected today Comorbid factors increasing risk of overdose: See prior notes. No additional risks detected today Risk of substance use disorder (SUD): Low Opioid Risk Tool - 01/23/17 0801      Family History of Substance Abuse   Alcohol  Negative    Illegal Drugs  Negative    Rx Drugs  Negative       Personal History of Substance Abuse   Alcohol  Negative    Illegal Drugs  Negative    Rx Drugs  Negative      Total Score   Opioid Risk Tool Scoring  0    Opioid Risk Interpretation  Low Risk      ORT Scoring interpretation table:  Score <3 = Low Risk for SUD  Score between 4-7 = Moderate Risk for SUD  Score >8 = High Risk for Opioid Abuse   Risk Mitigation Strategies:  Patient Counseling: Covered Patient-Prescriber Agreement (PPA): Present and active  Notification to other healthcare providers: Done  Pharmacologic Plan: No change in therapy, at this time.             Laboratory Chemistry  Inflammation Markers (CRP: Acute Phase) (ESR: Chronic Phase) Lab Results  Component Value Date   CRP 0.6 03/12/2015   ESRSEDRATE 9 03/12/2015                         Rheumatology Markers No results found for: Elayne Guerin, [facility]              Renal Function Markers Lab Results  Component Value Date   BUN 15 03/12/2015   CREATININE 0.82 03/12/2015   GFRAA >60 03/12/2015   GFRNONAA >60 03/12/2015                 Hepatic Function Markers Lab Results  Component Value Date   AST 41 03/12/2015   ALT 55 03/12/2015   ALBUMIN 4.2 03/12/2015   ALKPHOS 89 03/12/2015                 Electrolytes Lab Results  Component Value Date   NA 140 03/12/2015   K 4.2 03/12/2015   CL 105 03/12/2015   CALCIUM 10.0 03/12/2015   MG 1.8 03/12/2015                        Neuropathy Markers Lab Results  Component Value Date   HGBA1C 9.2 (H) 08/31/2012                 Bone Pathology Markers No results found for: VD25OH, YO060OK5TXH, FS1423TR3, UY2334DH6, 25OHVITD1, 25OHVITD2, 25OHVITD3, TESTOFREE, TESTOSTERONE                       Coagulation Parameters Lab Results  Component Value Date   PLT  176 03/13/2015                 Cardiovascular Markers Lab Results  Component Value Date   CKTOTAL 184 08/31/2012   CKMB 1.1 08/31/2012   TROPONINI < 0.02  08/31/2012   HGB 15.3 03/13/2015   HCT 46.4 03/13/2015                 CA Markers No results found for: CEA, CA125, LABCA2               Note: Lab results reviewed.  Recent Diagnostic Imaging Results  DG C-Arm 1-60 Min-No Report Fluoroscopy was utilized by the requesting physician.  No radiographic  interpretation.   Complexity Note: Imaging results reviewed. Results shared with Mr. Riches, using Layman's terms.                         Meds   Current Outpatient Medications:  .  cyanocobalamin (,VITAMIN B-12,) 1000 MCG/ML injection, Inject into the muscle every 30 (thirty) days. , Disp: , Rfl:  .  gabapentin (NEURONTIN) 600 MG tablet, Take 1 tablet (600 mg total) by mouth every 6 (six) hours., Disp: 360 tablet, Rfl: 0 .  insulin degludec (TRESIBA FLEXTOUCH) 100 UNIT/ML SOPN FlexTouch Pen, Inject 40 Units into the skin daily. , Disp: , Rfl:  .  ketoconazole (NIZORAL) 2 % shampoo, APPLY TO AFFECTED AREA EVERY DAY AS NEEDED, Disp: , Rfl: 4 .  liraglutide (VICTOZA) 18 MG/3ML SOPN, INJECT 0.3 MLS (1.8 MG TOTAL) SUBCUTANEOUSLY ONCE DAILY., Disp: , Rfl:  .  metFORMIN (GLUCOPHAGE) 1000 MG tablet, TAKE 1 TABLET (1,000 MG TOTAL) BY MOUTH 2 (TWO) TIMES DAILY WITH MEALS., Disp: , Rfl: 1 .  NOVOLOG FLEXPEN 100 UNIT/ML FlexPen, Inject 0-10 Units into the skin daily as needed for high blood sugar (sliding scale). Reported on 02/24/2015, Disp: , Rfl: 0 .  [START ON 05/21/2017] oxyCODONE (OXY IR/ROXICODONE) 5 MG immediate release tablet, Take 1 tablet (5 mg total) by mouth every 6 (six) hours as needed for severe pain., Disp: 120 tablet, Rfl: 0 .  [START ON 04/21/2017] oxyCODONE (OXY IR/ROXICODONE) 5 MG immediate release tablet, Take 1 tablet (5 mg total) by mouth every 6 (six) hours as needed for severe pain., Disp: 120 tablet, Rfl: 0 .  [START ON 03/22/2017] oxyCODONE (OXY IR/ROXICODONE) 5 MG immediate release tablet, Take 1 tablet (5 mg total) by mouth every 6 (six) hours as needed for severe pain., Disp:  120 tablet, Rfl: 0 .  potassium citrate (UROCIT-K) 10 MEQ (1080 MG) SR tablet, Take 10 mEq by mouth 2 (two) times daily., Disp: , Rfl:  .  rosuvastatin (CRESTOR) 5 MG tablet, Take 5 mg by mouth 3 (three) times a week. Monday Wednesday Friday, Disp: , Rfl:   ROS  Constitutional: Denies any fever or chills Gastrointestinal: No reported hemesis, hematochezia, vomiting, or acute GI distress Musculoskeletal: Denies any acute onset joint swelling, redness, loss of ROM, or weakness Neurological: No reported episodes of acute onset apraxia, aphasia, dysarthria, agnosia, amnesia, paralysis, loss of coordination, or loss of consciousness  Allergies  Mr. Blue has No Known Allergies.  [place]  Drug: Mr. Boulay  reports that he does not use drugs. Alcohol:  reports that he drinks alcohol. Tobacco:  reports that  has never smoked. he has never used smokeless tobacco. Medical:  has a past medical history of Arthritis, senescent (10/22/2014), Back pain, Chronic pain syndrome (10/22/2014), Diabetes mellitus without complication ([place]), History of  kidney stones, IBS (irritable bowel syndrome), Lateral meniscus tear, Preventative health care, Sleep apnea, and Spinal stenosis. Surgical: Mr. Ridlon  has a past surgical history that includes Tonsillectomy; Shoulder surgery; Palate surgery; Cervical fusion; Foot surgery; Lumbar laminectomy/decompression microdiscectomy (N/A, 02/06/2013); Knee arthroscopy (Right, 10/02/2013); Knee arthroscopy with medial menisectomy (Right, 03/25/2015); Knee bursectomy (Right, 03/25/2015); and Cystoscopy w/ retrogrades (Right, 11/19/2015). Family: family history includes COPD in his mother; Cancer in his mother; Diabetes in his father; Heart disease in his father and mother.  Constitutional Exam  General appearance: Well nourished, well developed, and well hydrated. In no apparent acute distress Vitals:   03/06/17 0901  BP: 136/66  Pulse: 74  Temp: 98 F (36.7 C)  SpO2: 98%  Weight:  245 lb (111.1 kg)  Height: _0  (1.676 m)  Psych/Mental status: Alert, oriented x 3 (person, place, & time)       Eyes: PERLA Respiratory: No evidence of acute respiratory distress  Cervical Spine Area Exam  Skin & Axial Inspection: No masses, redness, edema, swelling, or associated skin lesions Alignment: Symmetrical Functional ROM: Unrestricted ROM      Stability: No instability detected Muscle Tone/Strength: Functionally intact. No obvious neuro-muscular anomalies detected. Sensory (Neurological): Unimpaired Palpation: No palpable anomalies              Upper Extremity (UE) Exam    Side: Right upper extremity  Side: Left upper extremity  Skin & Extremity Inspection: Skin color, temperature, and hair growth are WNL. No peripheral edema or cyanosis. No masses, redness, swelling, asymmetry, or associated skin lesions. No contractures.  Skin & Extremity Inspection: Skin color, temperature, and hair growth are WNL. No peripheral edema or cyanosis. No masses, redness, swelling, asymmetry, or associated skin lesions. No contractures.  Functional ROM: Unrestricted ROM          Functional ROM: Unrestricted ROM          Muscle Tone/Strength: Functionally intact. No obvious neuro-muscular anomalies detected.  Muscle Tone/Strength: Functionally intact. No obvious neuro-muscular anomalies detected.  Sensory (Neurological): Unimpaired          Sensory (Neurological): Unimpaired          Palpation: No palpable anomalies              Palpation: No palpable anomalies              Specialized Test(s): Deferred         Specialized Test(s): Deferred            Lumbar Spine Area Exam  Skin & Axial Inspection: No masses, redness, or swelling Alignment: Symmetrical Functional ROM: Unrestricted ROM      Stability: No instability detected Muscle Tone/Strength: Functionally intact. No obvious neuro-muscular anomalies detected. Sensory (Neurological): Unimpaired Palpation: Complains of area being tender to  palpation       Provocative Tests: Lumbar Hyperextension and rotation test: evaluation deferred today       Lumbar Lateral bending test: evaluation deferred today       Patrick's Maneuver: evaluation deferred today                    Gait & Posture Assessment  Ambulation: Unassisted Gait: Relatively normal for age and body habitus Posture: WNL   Lower Extremity Exam    Side: Right lower extremity  Side: Left lower extremity  Skin & Extremity Inspection: Skin color, temperature, and hair growth are WNL. No peripheral edema or cyanosis. No masses, redness, swelling, asymmetry, or associated skin lesions.  No contractures.  Skin & Extremity Inspection: Skin color, temperature, and hair growth are WNL. No peripheral edema or cyanosis. No masses, redness, swelling, asymmetry, or associated skin lesions. No contractures.  Functional ROM: Unrestricted ROM          Functional ROM: Unrestricted ROM          Muscle Tone/Strength: Functionally intact. No obvious neuro-muscular anomalies detected.  Muscle Tone/Strength: Functionally intact. No obvious neuro-muscular anomalies detected.  Sensory (Neurological): Unimpaired  Sensory (Neurological): Unimpaired  Palpation: No palpable anomalies  Palpation: No palpable anomalies   Assessment  Primary Diagnosis & Pertinent Problem List: The primary encounter diagnosis was Lumbar spondylosis. Diagnoses of Chronic lower extremity pain (Right), Chronic sacroiliac joint pain (Bilateral) (R>L), Neurogenic pain, Chronic pain syndrome, and Long term current use of opiate analgesic were also pertinent to this visit.  Status Diagnosis  Controlled Controlled Controlled 1. Lumbar spondylosis   2. Chronic lower extremity pain (Right)   3. Chronic sacroiliac joint pain (Bilateral) (R>L)   4. Neurogenic pain   5. Chronic pain syndrome   6. Long term current use of opiate analgesic     Problems updated and reviewed during this visit: No problems updated. Plan of  Care  Pharmacotherapy (Medications Ordered): Meds ordered this encounter  Medications  . oxyCODONE (OXY IR/ROXICODONE) 5 MG immediate release tablet    Sig: Take 1 tablet (5 mg total) by mouth every 6 (six) hours as needed for severe pain.    Dispense:  120 tablet    Refill:  0    Do not place this medication, or any other prescription from our practice, on "Automatic Refill". Patient may have prescription filled one day early if pharmacy is closed on scheduled refill date. Do not fill until:05/21/2017 To last until: 06/20/2017    Order Specific Question:   Supervising Provider    Answer:   Milinda Pointer [phone]  . oxyCODONE (OXY IR/ROXICODONE) 5 MG immediate release tablet    Sig: Take 1 tablet (5 mg total) by mouth every 6 (six) hours as needed for severe pain.    Dispense:  120 tablet    Refill:  0    Do not place this medication, or any other prescription from our practice, on "Automatic Refill". Patient may have prescription filled one day early if pharmacy is closed on scheduled refill date. Do not fill until: 04/21/2017 To last until:05/21/2017    Order Specific Question:   Supervising Provider    Answer:   Milinda Pointer [phone]  . oxyCODONE (OXY IR/ROXICODONE) 5 MG immediate release tablet    Sig: Take 1 tablet (5 mg total) by mouth every 6 (six) hours as needed for severe pain.    Dispense:  120 tablet    Refill:  0    Do not place this medication, or any other prescription from our practice, on "Automatic Refill". Patient may have prescription filled one day early if pharmacy is closed on scheduled refill date. Do not fill until: 03/22/2017 To last until:04/21/2017    Order Specific Question:   Supervising Provider    Answer:   Milinda Pointer [phone]  . gabapentin (NEURONTIN) 600 MG tablet    Sig: Take 1 tablet (600 mg total) by mouth every 6 (six) hours.    Dispense:  360 tablet    Refill:  0    Do not place this medication, or any other prescription from our  practice, on "Automatic Refill". Patient may have prescription filled one day early if pharmacy  is closed on scheduled refill date.    Order Specific Question:   Supervising Provider    Answer:   Milinda Pointer [[id number]]   New Prescriptions   No medications on file   Medications administered today: [name]. "Pilar Plate" had no medications administered during this visit. Lab-work, procedure(s), and/or referral(s): Orders Placed This Encounter  Procedures  . ToxASSURE Select 13 (MW), Urine   Imaging and/or referral(s): None  Interventional management options: Planned, scheduled, and/or pending: Not at this time    Considering: Palliativebilateral lumbar facet block Palliativebilateral lumbar facet radiofrequencyablation (Right side done 04/13/2016) (100% relief) Diagnostic right knee genicular nerve block.  Possible right knee genicular nerve radiofrequencyablation   Palliative PRN treatment(s): Diagnostic Right Suprascapular nerve Block#2 Possible Right Suprascapular nerveRFA     Provider-requested follow-up: Return in about 3 months (around 06/03/2017) for MedMgmt with Me Donella Stade Edison Pace).  No future appointments. Primary Care Physician: Patient, No Pcp Per Location: [facility] Note by: Vevelyn Francois NP Date: 03/06/2017; Time: 9:26 AM  Pain Score Disclaimer: We use the NRS-11 scale. This is a self-reported, subjective measurement of pain severity with only modest accuracy. It is used primarily to identify changes within a particular patient. It must be understood that outpatient pain scales are significantly less accurate that those used for research, where they can be applied under ideal controlled circumstances with minimal exposure to variables. In reality, the score is likely to be a combination of pain intensity and pain affect, where pain affect describes the degree of emotional arousal or changes in action  readiness caused by the sensory experience of pain. Factors such as social and work situation, setting, emotional state, anxiety levels, expectation, and prior pain experience may influence pain perception and show large inter-individual differences that may also be affected by time variables.  Patient instructions provided during this appointment: Patient Instructions    ____________________________________________________________________________________________  Medication Rules  Applies to: All patients receiving prescriptions (written or electronic).  Pharmacy of record: Pharmacy where electronic prescriptions will be sent. If written prescriptions are taken to a different pharmacy, please inform the nursing staff. The pharmacy listed in the electronic medical record should be the one where you would like electronic prescriptions to be sent.  Prescription refills: Only during scheduled appointments. Applies to both, written and electronic prescriptions.  NOTE: The following applies primarily to controlled substances (Opioid* Pain Medications).   Patient's responsibilities: 1. Pain Pills: Bring all pain pills to every appointment (except for procedure appointments). 2. Pill Bottles: Bring pills in original pharmacy bottle. Always bring newest bottle. Bring bottle, even if empty. 3. Medication refills: You are responsible for knowing and keeping track of what medications you need refilled. The day before your appointment, write a list of all prescriptions that need to be refilled. Bring that list to your appointment and give it to the admitting nurse. Prescriptions will be written only during appointments. If you forget a medication, it will not be "Called in", "Faxed", or "electronically sent". You will need to get another appointment to get these prescribed. 4. Prescription Accuracy: You are responsible for carefully inspecting your prescriptions before leaving our office. Have the  discharge nurse carefully go over each prescription with you, before taking them home. Make sure that your name is accurately spelled, that your address is correct. Check the name and dose of your medication to make sure it is accurate. Check the number of pills, and the written instructions to make sure they are  clear and accurate. Make sure that you are given enough medication to last until your next medication refill appointment. 5. Taking Medication: Take medication as prescribed. Never take more pills than instructed. Never take medication more frequently than prescribed. Taking less pills or less frequently is permitted and encouraged, when it comes to controlled substances (written prescriptions).  6. Inform other Doctors: Always inform, all of your healthcare providers, of all the medications you take. 7. Pain Medication from other Providers: You are not allowed to accept any additional pain medication from any other Doctor or Healthcare provider. There are two exceptions to this rule. (see below) In the event that you require additional pain medication, you are responsible for notifying us, as stated below. 8. Medication Agreement: You are responsible for carefully reading and following our Medication Agreement. This must be signed before receiving any prescriptions from our practice. Safely store a copy of your signed Agreement. Violations to the Agreement will result in no further prescriptions. (Additional copies of our Medication Agreement are available upon request.) 9. Laws, Rules, & Regulations: All patients are expected to follow all Federal and [employer], [employer], Rules, [employer]. Ignorance of the Laws does not constitute a valid excuse. The use of any illegal substances is prohibited. 10. Adopted CDC guidelines & recommendations: Target dosing levels will be at or below 60 MME/day. Use of benzodiazepines** is not recommended.  Exceptions: There are only two exceptions to the rule of  not receiving pain medications from other Healthcare Providers. 1. Exception #1 (Emergencies): In the event of an emergency (i.e.: accident requiring emergency care), you are allowed to receive additional pain medication. However, you are responsible for: As soon as you are able, call our office [phone], at any time of the day or night, and leave a message stating your name, the date and nature of the emergency, and the name and dose of the medication prescribed. In the event that your call is answered by a member of our staff, make sure to document and save the date, time, and the name of the person that took your information.  2. Exception #2 (Planned Surgery): In the event that you are scheduled by another doctor or dentist to have any type of surgery or procedure, you are allowed (for a period no longer than 30 days), to receive additional pain medication, for the acute post-op pain. However, in this case, you are responsible for picking up a copy of our "Post-op Pain Management for Surgeons" handout, and giving it to your surgeon or dentist. This document is available at our office, and does not require an appointment to obtain it. Simply go to our office during business hours (Monday-Thursday from 8:00 AM to 4:00 PM) (Friday 8:00 AM to 12:00 Noon) or if you have a scheduled appointment with [place], prior to your surgery, and ask for it by name. In addition, you will need to provide [place] with your name, name of your surgeon, type of surgery, and date of procedure or surgery.  *Opioid medications include: morphine, codeine, oxycodone, oxymorphone, hydrocodone, hydromorphone, meperidine, tramadol, tapentadol, buprenorphine, fentanyl, methadone. **Benzodiazepine medications include: diazepam (Valium), alprazolam (Xanax), clonazepam (Klonopine), lorazepam (Ativan), clorazepate (Tranxene), chlordiazepoxide (Librium), estazolam (Prosom), oxazepam (Serax), temazepam (Restoril), triazolam  (Halcion)  ____________________________________________________________________________________________  BMI Assessment: Estimated body mass index is 39.54 kg/m as calculated from the following:   Height as of this encounter: _0  (1.676 m).   Weight as of this encounter: 245 lb (111.1 kg).  BMI interpretation table: BMI  level Category Range association with higher incidence of chronic pain  <18 kg/m2 Underweight   18.5-24.9 kg/m2 Ideal body weight   25-29.9 kg/m2 Overweight Increased incidence by 20%  30-34.9 kg/m2 Obese (Class I) Increased incidence by 68%  35-39.9 kg/m2 Severe obesity (Class II) Increased incidence by 136%  >40 kg/m2 Extreme obesity (Class III) Increased incidence by 254%   BMI Readings from Last 4 Encounters:  03/06/17 39.54 kg/m  01/23/17 39.54 kg/m  12/07/16 40.35 kg/m  11/17/16 41.00 kg/m   Wt Readings from Last 4 Encounters:  03/06/17 245 lb (111.1 kg)  01/23/17 245 lb (111.1 kg)  12/07/16 250 lb (113.4 kg)  11/17/16 254 lb (115.2 kg)

## 2017-03-06 NOTE — Patient Instructions (Addendum)
____________________________________________________________________________________________  Medication Rules  Applies to: All patients receiving prescriptions (written or electronic).  Pharmacy of record: Pharmacy where electronic prescriptions will be sent. If written prescriptions are taken to a different pharmacy, please inform the nursing staff. The pharmacy listed in the electronic medical record should be the one where you would like electronic prescriptions to be sent.  Prescription refills: Only during scheduled appointments. Applies to both, written and electronic prescriptions.  NOTE: The following applies primarily to controlled substances (Opioid* Pain Medications).   Patient's responsibilities: 1. Pain Pills: Bring all pain pills to every appointment (except for procedure appointments). 2. Pill Bottles: Bring pills in original pharmacy bottle. Always bring newest bottle. Bring bottle, even if empty. 3. Medication refills: You are responsible for knowing and keeping track of what medications you need refilled. The day before your appointment, write a list of all prescriptions that need to be refilled. Bring that list to your appointment and give it to the admitting nurse. Prescriptions will be written only during appointments. If you forget a medication, it will not be "Called in", "Faxed", or "electronically sent". You will need to get another appointment to get these prescribed. 4. Prescription Accuracy: You are responsible for carefully inspecting your prescriptions before leaving our office. Have the discharge nurse carefully go over each prescription with you, before taking them home. Make sure that your name is accurately spelled, that your address is correct. Check the name and dose of your medication to make sure it is accurate. Check the number of pills, and the written instructions to make sure they are clear and accurate. Make sure that you are given enough medication to  last until your next medication refill appointment. 5. Taking Medication: Take medication as prescribed. Never take more pills than instructed. Never take medication more frequently than prescribed. Taking less pills or less frequently is permitted and encouraged, when it comes to controlled substances (written prescriptions).  6. Inform other Doctors: Always inform, all of your healthcare providers, of all the medications you take. 7. Pain Medication from other Providers: You are not allowed to accept any additional pain medication from any other Doctor or Healthcare provider. There are two exceptions to this rule. (see below) In the event that you require additional pain medication, you are responsible for notifying us, as stated below. 8. Medication Agreement: You are responsible for carefully reading and following our Medication Agreement. This must be signed before receiving any prescriptions from our practice. Safely store a copy of your signed Agreement. Violations to the Agreement will result in no further prescriptions. (Additional copies of our Medication Agreement are available upon request.) 9. Laws, Rules, & Regulations: All patients are expected to follow all Federal and State Laws, Statutes, Rules, & Regulations. Ignorance of the Laws does not constitute a valid excuse. The use of any illegal substances is prohibited. 10. Adopted CDC guidelines & recommendations: Target dosing levels will be at or below 60 MME/day. Use of benzodiazepines** is not recommended.  Exceptions: There are only two exceptions to the rule of not receiving pain medications from other Healthcare Providers. 1. Exception #1 (Emergencies): In the event of an emergency (i.e.: accident requiring emergency care), you are allowed to receive additional pain medication. However, you are responsible for: As soon as you are able, call our office [phone], at any time of the day or night, and leave a message stating your  name, the date and nature of the emergency, and the name and dose of the medication   prescribed. In the event that your call is answered by a member of our staff, make sure to document and save the date, time, and the name of the person that took your information.  2. Exception #2 (Planned Surgery): In the event that you are scheduled by another doctor or dentist to have any type of surgery or procedure, you are allowed (for a period no longer than 30 days), to receive additional pain medication, for the acute post-op pain. However, in this case, you are responsible for picking up a copy of our "Post-op Pain Management for Surgeons" handout, and giving it to your surgeon or dentist. This document is available at our office, and does not require an appointment to obtain it. Simply go to our office during business hours (Monday-Thursday from 8:00 AM to 4:00 PM) (Friday 8:00 AM to 12:00 Noon) or if you have a scheduled appointment with us, prior to your surgery, and ask for it by name. In addition, you will need to provide us with your name, name of your surgeon, type of surgery, and date of procedure or surgery.  *Opioid medications include: morphine, codeine, oxycodone, oxymorphone, hydrocodone, hydromorphone, meperidine, tramadol, tapentadol, buprenorphine, fentanyl, methadone. **Benzodiazepine medications include: diazepam (Valium), alprazolam (Xanax), clonazepam (Klonopine), lorazepam (Ativan), clorazepate (Tranxene), chlordiazepoxide (Librium), estazolam (Prosom), oxazepam (Serax), temazepam (Restoril), triazolam (Halcion)  ____________________________________________________________________________________________  BMI Assessment: Estimated body mass index is 39.54 kg/m as calculated from the following:   Height as of this encounter: 5\' 6"  (1.676 m).   Weight as of this encounter: 245 lb (111.1 kg).  BMI interpretation table: BMI level Category Range association with higher incidence of chronic pain   <18 kg/m2 Underweight   18.5-24.9 kg/m2 Ideal body weight   25-29.9 kg/m2 Overweight Increased incidence by 20%  30-34.9 kg/m2 Obese (Class I) Increased incidence by 68%  35-39.9 kg/m2 Severe obesity (Class II) Increased incidence by 136%  >40 kg/m2 Extreme obesity (Class III) Increased incidence by 254%   BMI Readings from Last 4 Encounters:  03/06/17 39.54 kg/m  01/23/17 39.54 kg/m  12/07/16 40.35 kg/m  11/17/16 41.00 kg/m   Wt Readings from Last 4 Encounters:  03/06/17 245 lb (111.1 kg)  01/23/17 245 lb (111.1 kg)  12/07/16 250 lb (113.4 kg)  11/17/16 254 lb (115.2 kg)

## 2017-03-06 NOTE — Progress Notes (Signed)
Nursing Pain Medication Assessment:  Safety precautions to be maintained throughout the outpatient stay will include: orient to surroundings, keep bed in low position, maintain call bell within reach at all times, provide assistance with transfer out of bed and ambulation.  Medication Inspection Compliance: Pill count conducted under aseptic conditions, in front of the patient. Neither the pills nor the bottle was removed from the patient's sight at any time. Once count was completed pills were immediately returned to the patient in their original bottle.  Medication: Oxycodone IR Pill/Patch Count: 78 of 120 pills remain Pill/Patch Appearance: Markings consistent with prescribed medication Bottle Appearance: Standard pharmacy container. Clearly labeled. Filled Date: 2 / 4 / 2019 Last Medication intake:  Today

## 2017-03-11 LAB — TOXASSURE SELECT 13 (MW), URINE

## 2017-05-10 ENCOUNTER — Telehealth: Payer: Self-pay

## 2017-05-10 NOTE — Telephone Encounter (Signed)
Mr Henreitta LeberBridges called and wants Eli PhillipsKori to call him back about a med he needs to discuss with her [phone]

## 2017-05-10 NOTE — Telephone Encounter (Signed)
Patient called to notify us that he had been given Nucynta for Kidney stones.  Patient unsure of dosage because he took script to pharmacy to get filled.

## 2017-05-17 ENCOUNTER — Telehealth: Payer: Self-pay | Admitting: Pain Medicine

## 2017-05-17 NOTE — Telephone Encounter (Signed)
Patient would like to go to a different pharmacy due to the fact that he is having to pay more.  Instructed patient that if he does take it to another pharmacy to call us and let us know  Patient states understanding. Marland Kitchen

## 2017-05-17 NOTE — Telephone Encounter (Signed)
Patient lvmail on 05-17-17 asking to speak with [facility]

## 2017-05-31 ENCOUNTER — Encounter: Payer: BLUE CROSS/BLUE SHIELD | Admitting: Nurse Practitioner

## 2017-06-05 ENCOUNTER — Encounter: Payer: Self-pay | Admitting: Nurse Practitioner

## 2017-06-05 ENCOUNTER — Telehealth: Payer: Self-pay | Admitting: Pain Medicine

## 2017-06-05 ENCOUNTER — Ambulatory Visit: Payer: BLUE CROSS/BLUE SHIELD | Attending: Nurse Practitioner | Admitting: Nurse Practitioner

## 2017-06-05 ENCOUNTER — Other Ambulatory Visit: Payer: Self-pay

## 2017-06-05 VITALS — BP 123/84 | HR 91 | Temp 98.3°F | Ht 66.0 in | Wt 245.0 lb

## 2017-06-05 DIAGNOSIS — E119 Type 2 diabetes mellitus without complications: Secondary | ICD-10-CM | POA: Diagnosis not present

## 2017-06-05 DIAGNOSIS — Z794 Long term (current) use of insulin: Secondary | ICD-10-CM | POA: Diagnosis not present

## 2017-06-05 DIAGNOSIS — Z6839 Body mass index (BMI) 39.0-39.9, adult: Secondary | ICD-10-CM | POA: Diagnosis not present

## 2017-06-05 DIAGNOSIS — Z79891 Long term (current) use of opiate analgesic: Secondary | ICD-10-CM | POA: Diagnosis not present

## 2017-06-05 DIAGNOSIS — M7918 Myalgia, other site: Secondary | ICD-10-CM | POA: Diagnosis not present

## 2017-06-05 DIAGNOSIS — G894 Chronic pain syndrome: Secondary | ICD-10-CM | POA: Diagnosis present

## 2017-06-05 DIAGNOSIS — M545 Low back pain, unspecified: Secondary | ICD-10-CM

## 2017-06-05 DIAGNOSIS — M4726 Other spondylosis with radiculopathy, lumbar region: Secondary | ICD-10-CM | POA: Insufficient documentation

## 2017-06-05 DIAGNOSIS — G4730 Sleep apnea, unspecified: Secondary | ICD-10-CM | POA: Diagnosis not present

## 2017-06-05 DIAGNOSIS — Z79899 Other long term (current) drug therapy: Secondary | ICD-10-CM | POA: Insufficient documentation

## 2017-06-05 DIAGNOSIS — Z87442 Personal history of urinary calculi: Secondary | ICD-10-CM | POA: Insufficient documentation

## 2017-06-05 DIAGNOSIS — G8929 Other chronic pain: Secondary | ICD-10-CM

## 2017-06-05 DIAGNOSIS — Z8249 Family history of ischemic heart disease and other diseases of the circulatory system: Secondary | ICD-10-CM | POA: Insufficient documentation

## 2017-06-05 DIAGNOSIS — M1711 Unilateral primary osteoarthritis, right knee: Secondary | ICD-10-CM | POA: Diagnosis not present

## 2017-06-05 DIAGNOSIS — N200 Calculus of kidney: Secondary | ICD-10-CM | POA: Diagnosis not present

## 2017-06-05 DIAGNOSIS — M19011 Primary osteoarthritis, right shoulder: Secondary | ICD-10-CM | POA: Insufficient documentation

## 2017-06-05 DIAGNOSIS — K589 Irritable bowel syndrome without diarrhea: Secondary | ICD-10-CM | POA: Diagnosis not present

## 2017-06-05 DIAGNOSIS — E785 Hyperlipidemia, unspecified: Secondary | ICD-10-CM | POA: Insufficient documentation

## 2017-06-05 DIAGNOSIS — M533 Sacrococcygeal disorders, not elsewhere classified: Secondary | ICD-10-CM | POA: Insufficient documentation

## 2017-06-05 DIAGNOSIS — M79604 Pain in right leg: Secondary | ICD-10-CM | POA: Diagnosis not present

## 2017-06-05 DIAGNOSIS — Z5181 Encounter for therapeutic drug level monitoring: Secondary | ICD-10-CM | POA: Diagnosis not present

## 2017-06-05 DIAGNOSIS — G629 Polyneuropathy, unspecified: Secondary | ICD-10-CM | POA: Insufficient documentation

## 2017-06-05 DIAGNOSIS — M47816 Spondylosis without myelopathy or radiculopathy, lumbar region: Secondary | ICD-10-CM

## 2017-06-05 DIAGNOSIS — M792 Neuralgia and neuritis, unspecified: Secondary | ICD-10-CM

## 2017-06-05 DIAGNOSIS — M48061 Spinal stenosis, lumbar region without neurogenic claudication: Secondary | ICD-10-CM | POA: Insufficient documentation

## 2017-06-05 MED ORDER — OXYCODONE HCL 5 MG PO TABS: 5.0000 mg | ORAL_TABLET | Freq: Four times a day (QID) | ORAL | 0 refills | Status: DC | PRN

## 2017-06-05 MED ORDER — GABAPENTIN 600 MG PO TABS: 600.0000 mg | ORAL_TABLET | Freq: Four times a day (QID) | ORAL | 0 refills | Status: DC

## 2017-06-05 NOTE — Patient Instructions (Addendum)
____________________________________________________________________________________________  Medication Rules  Applies to: All patients receiving prescriptions (written or electronic).  Pharmacy of record: Pharmacy where electronic prescriptions will be sent. If written prescriptions are taken to a different pharmacy, please inform the nursing staff. The pharmacy listed in the electronic medical record should be the one where you would like electronic prescriptions to be sent.  Prescription refills: Only during scheduled appointments. Applies to both, written and electronic prescriptions.  NOTE: The following applies primarily to controlled substances (Opioid* Pain Medications).   Patient's responsibilities: 1. Pain Pills: Bring all pain pills to every appointment (except for procedure appointments). 2. Pill Bottles: Bring pills in original pharmacy bottle. Always bring newest bottle. Bring bottle, even if empty. 3. Medication refills: You are responsible for knowing and keeping track of what medications you need refilled. The day before your appointment, write a list of all prescriptions that need to be refilled. Bring that list to your appointment and give it to the admitting nurse. Prescriptions will be written only during appointments. If you forget a medication, it will not be "Called in", "Faxed", or "electronically sent". You will need to get another appointment to get these prescribed. 4. Prescription Accuracy: You are responsible for carefully inspecting your prescriptions before leaving our office. Have the discharge nurse carefully go over each prescription with you, before taking them home. Make sure that your name is accurately spelled, that your address is correct. Check the name and dose of your medication to make sure it is accurate. Check the number of pills, and the written instructions to make sure they are clear and accurate. Make sure that you are given enough medication to last  until your next medication refill appointment. 5. Taking Medication: Take medication as prescribed. Never take more pills than instructed. Never take medication more frequently than prescribed. Taking less pills or less frequently is permitted and encouraged, when it comes to controlled substances (written prescriptions).  6. Inform other Doctors: Always inform, all of your healthcare providers, of all the medications you take. 7. Pain Medication from other Providers: You are not allowed to accept any additional pain medication from any other Doctor or Healthcare provider. There are two exceptions to this rule. (see below) In the event that you require additional pain medication, you are responsible for notifying us, as stated below. 8. Medication Agreement: You are responsible for carefully reading and following our Medication Agreement. This must be signed before receiving any prescriptions from our practice. Safely store a copy of your signed Agreement. Violations to the Agreement will result in no further prescriptions. (Additional copies of our Medication Agreement are available upon request.) 9. Laws, Rules, & Regulations: All patients are expected to follow all Federal and State Laws, Statutes, Rules, & Regulations. Ignorance of the Laws does not constitute a valid excuse. The use of any illegal substances is prohibited. 10. Adopted CDC guidelines & recommendations: Target dosing levels will be at or below 60 MME/day. Use of benzodiazepines** is not recommended.  Exceptions: There are only two exceptions to the rule of not receiving pain medications from other Healthcare Providers. 1. Exception #1 (Emergencies): In the event of an emergency (i.e.: accident requiring emergency care), you are allowed to receive additional pain medication. However, you are responsible for: As soon as you are able, call our office [phone], at any time of the day or night, and leave a message stating your name, the  date and nature of the emergency, and the name and dose of the medication   prescribed. In the event that your call is answered by a member of our staff, make sure to document and save the date, time, and the name of the person that took your information.  2. Exception #2 (Planned Surgery): In the event that you are scheduled by another doctor or dentist to have any type of surgery or procedure, you are allowed (for a period no longer than 30 days), to receive additional pain medication, for the acute post-op pain. However, in this case, you are responsible for picking up a copy of our "Post-op Pain Management for Surgeons" handout, and giving it to your surgeon or dentist. This document is available at our office, and does not require an appointment to obtain it. Simply go to our office during business hours (Monday-Thursday from 8:00 AM to 4:00 PM) (Friday 8:00 AM to 12:00 Noon) or if you have a scheduled appointment with [place], prior to your surgery, and ask for it by name. In addition, you will need to provide [place] with your name, name of your surgeon, type of surgery, and date of procedure or surgery.  *Opioid medications include: morphine, codeine, oxycodone, oxymorphone, hydrocodone, hydromorphone, meperidine, tramadol, tapentadol, buprenorphine, fentanyl, methadone. **Benzodiazepine medications include: diazepam (Valium), alprazolam (Xanax), clonazepam (Klonopine), lorazepam (Ativan), clorazepate (Tranxene), chlordiazepoxide (Librium), estazolam (Prosom), oxazepam (Serax), temazepam (Restoril), triazolam (Halcion) (Last updated: 03/16/2017) ____________________________________________________________________________________________    BMI Assessment: Estimated body mass index is 39.54 kg/m as calculated from the following:   Height as of this encounter:  (1.676 m).   Weight as of this encounter: 245 lb (111.1 kg).  BMI interpretation table: BMI level Category Range association with higher  incidence of chronic pain  <18 kg/m2 Underweight   18.5-24.9 kg/m2 Ideal body weight   25-29.9 kg/m2 Overweight Increased incidence by 20%  30-34.9 kg/m2 Obese (Class I) Increased incidence by 68%  35-39.9 kg/m2 Severe obesity (Class II) Increased incidence by 136%  >40 kg/m2 Extreme obesity (Class III) Increased incidence by 254%   Patient's current BMI Ideal Body weight  Body mass index is 39.54 kg/m. Ideal body weight: 63.8 kg (140 lb 10.5 oz) Adjusted ideal body weight: 82.7 kg (182 lb 6.3 oz)   BMI Readings from Last 4 Encounters:  06/05/17 39.54 kg/m  03/06/17 39.54 kg/m  01/23/17 39.54 kg/m  12/07/16 40.35 kg/m   Wt Readings from Last 4 Encounters:  06/05/17 245 lb (111.1 kg)  03/06/17 245 lb (111.1 kg)  01/23/17 245 lb (111.1 kg)  12/07/16 250 lb (113.4 kg)

## 2017-06-05 NOTE — Progress Notes (Addendum)
Patient's Name: Adam Petty.  MRN: [id number]  Referring Provider: No ref. provider found  DOB: Feb 27, 1960  PCP: Patient, No Pcp Per  DOS: 06/05/2017  Note by: Vevelyn Francois NP  Service setting: Ambulatory outpatient  Specialty: Interventional Pain Management  Location: [facility] (AMB) Pain Management Facility    Patient type: Established    Primary Reason(s) for Visit: Encounter for prescription drug management. (Level of risk: moderate)  CC: Back Pain (lower right)  HPI  Adam Petty is a 57 y.o. year old, male patient, who comes today for a medication management evaluation. 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); Discogenic syndrome, lumbar (WC injury); Osteoarthritis of spine with radiculopathy, lumbar region (WC injury); Lumbar facet syndrome (WC injury) (Bilateral) (R>L); Lumbosacral radiculopathy (WC injury); Pain in right knee (WC injury); Obesity, Class III, BMI 40-49.9 (morbid obesity) ([place]); Uncomplicated opioid dependence ([place]); Therapeutic opioid-induced constipation (OIC); Dyslipidemia; Microalbuminuria; Adiposity; Lumbar canal stenosis; Type 2 diabetes mellitus ([place]); Long term prescription opiate use; Encounter for chronic pain management; Neurogenic pain; Neuropathic pain; Musculoskeletal pain; Lumbar spondylosis; Chronic knee pain (Secondary source of pain) (Right); Osteoarthritis of knee (Location of Secondary source of pain) (Right); Chronic shoulder pain [facility] source of pain) (Right); 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 His primarily concern today is the Back Pain (lower right)  Pain Assessment: Location: Lower, Right Back Radiating: pain radiates down both leg to foot but more one the right side Onset: More than a month ago Duration: Chronic pain Quality: Constant Severity: 5 /10 (subjective, self-reported pain score)  Note: Reported level is compatible with observation.                          Timing: Constant Modifying factors: lay down ,heating pad, hot bath, meds just take the edge off BP: 123/84  HR: 91  Adam Petty was last scheduled for an appointment on 03/06/2017 for medication management. During today's appointment we reviewed Adam Petty chronic pain status, as well as his outpatient medication regimen.  He was seen at the urologist, Dr Rogers Blocker on Wednesday for recurring kidney stones. He states that he had UA with culture. He admits that there were xrays. He states that he has been on Nyucnta on last year. He states that it lasted until Feb. He states that his urologist does not want to prescribe this any longer secondary to prior authorization. He states that the insurance wants him to try an alternative to the [place] . He states that the Oxycodone is not effective for the kidney stones.   The patient  reports that he does not use drugs. His body mass index is 39.54 kg/m.  Further details on both, my assessment(s), as well as the proposed treatment plan, please see below.  Controlled Substance Pharmacotherapy Assessment REMS (Risk Evaluation and Mitigation Strategy)  Analgesic: Oxycodone IR 5 mg 1 tablet by mouth every 6 hours (20 mg/day)  MME/day: 30 mg/day   Adam Fischer, RN  06/05/2017  8:32  AM  Sign at close encounter Nursing Pain Medication Assessment:  Safety precautions to be maintained throughout the outpatient stay will include: orient to  surroundings, keep bed in low position, maintain call bell within reach at all times, provide assistance with transfer out of bed and ambulation.  Medication Inspection Compliance: Pill count conducted under aseptic conditions, in front of the patient. Neither the pills nor the bottle was removed from the patient's sight at any time. Once count was completed pills were immediately returned to the patient in their original bottle.  Medication: Oxycodone IR Pill/Patch Count: 79 of 120 pills remain Pill/Patch Appearance: Markings consistent with prescribed medication Bottle Appearance: Standard pharmacy container. Clearly labeled. Filled Date: 5 / 8 / 2019 Last Medication intake:  Today   Pharmacokinetics: Liberation and absorption (onset of action): WNL Distribution (time to peak effect): WNL Metabolism and excretion (duration of action): WNL         Pharmacodynamics: Desired effects: Analgesia: Adam Petty reports >50% benefit. Functional ability: Patient reports that medication allows him to accomplish basic ADLs Clinically meaningful improvement in function (CMIF): Sustained CMIF goals met Perceived effectiveness: Described as relatively effective, allowing for increase in activities of daily living (ADL) Undesirable effects: Side-effects or Adverse reactions: None reported Monitoring: [place] PMP: Online review of the past 51-monthperiod conducted. Compliant with practice rules and regulations Last UDS on record: Summary  Date Value Ref Range Status  03/06/2017 FINAL  Final    Comment:    ==================================================================== TOXASSURE SELECT 13 (MW) ==================================================================== Test                             Result       Flag       Units Drug Present and Declared for Prescription Verification   Oxycodone                      1386         EXPECTED   ng/mg creat   Oxymorphone                    1011          EXPECTED   ng/mg creat   Noroxycodone                   1650         EXPECTED   ng/mg creat   Noroxymorphone                 283          EXPECTED   ng/mg creat    Sources of oxycodone are scheduled prescription medications.    Oxymorphone, noroxycodone, and noroxymorphone are expected    metabolites of oxycodone. Oxymorphone is also available as a    scheduled prescription medication. ==================================================================== Test                      Result    Flag   Units      Ref Range   Creatinine              169              mg/dL      >=20 ==================================================================== Declared Medications:  The flagging and interpretation on this report are based on the  following declared medications.  Unexpected results may arise from  inaccuracies in the declared medications.  **  Note: The testing scope of this panel includes these medications:  Oxycodone  **Note: The testing scope of this panel does not include following  reported medications:  Cyanocobalamin  Gabapentin  Insulin  Ketoconazole  Liraglutide  Metformin  Potassium  Rosuvastatin ==================================================================== For clinical consultation, please call [phone]. ====================================================================    UDS interpretation: Compliant          Medication Assessment Form: Reviewed. Patient indicates being compliant with therapy Treatment compliance: Compliant Risk Assessment Profile: Aberrant behavior: See prior evaluations. None observed or detected today Comorbid factors increasing risk of overdose: See prior notes. No additional risks detected today Risk of substance use disorder (SUD): Low  ORT Scoring interpretation table:  Score <3 = Low Risk for SUD  Score between 4-7 = Moderate Risk for SUD  Score >8 = High Risk for Opioid Abuse   Risk Mitigation Strategies:  Patient  Counseling: Covered Patient-Prescriber Agreement (PPA): Present and active  Notification to other healthcare providers: Done  Pharmacologic Plan: No change in therapy, at this time.             Laboratory Chemistry  Inflammation Markers (CRP: Acute Phase) (ESR: Chronic Phase) Lab Results  Component Value Date   CRP 0.6 03/12/2015   ESRSEDRATE 9 03/12/2015                         Rheumatology Markers No results found for: Elayne Guerin, [facility]                      Renal Function Markers Lab Results  Component Value Date   BUN 15 03/12/2015   CREATININE 0.82 03/12/2015   GFRAA >60 03/12/2015   GFRNONAA >60 03/12/2015                              Hepatic Function Markers Lab Results  Component Value Date   AST 41 03/12/2015   ALT 55 03/12/2015   ALBUMIN 4.2 03/12/2015   ALKPHOS 89 03/12/2015                        Electrolytes Lab Results  Component Value Date   NA 140 03/12/2015   K 4.2 03/12/2015   CL 105 03/12/2015   CALCIUM 10.0 03/12/2015   MG 1.8 03/12/2015                        Neuropathy Markers Lab Results  Component Value Date   HGBA1C 9.2 (H) 08/31/2012                        Bone Pathology Markers No results found for: [place], [id number], [id number], [id number], 25OHVITD1, 25OHVITD2, 25OHVITD3, TESTOFREE, TESTOSTERONE                       Coagulation Parameters Lab Results  Component Value Date   PLT 176 03/13/2015                        Cardiovascular Markers Lab Results  Component Value Date   CKTOTAL 184 08/31/2012   CKMB 1.1 08/31/2012   TROPONINI < 0.02 08/31/2012   HGB 15.3 03/13/2015   HCT 46.4 03/13/2015  CA Markers No results found for: CEA, CA125, LABCA2                      Note: Lab results reviewed.  Recent Diagnostic Imaging Results  DG C-Arm 1-60 Min-No Report Fluoroscopy was utilized by the requesting physician.  No radiographic  interpretation.   Complexity  Note: Imaging results reviewed. Results shared with Mr. Farrel, using Layman's terms.                         Meds   Current Outpatient Medications:  .  cyanocobalamin (,VITAMIN B-12,) 1000 MCG/ML injection, Inject into the muscle every 30 (thirty) days. , Disp: , Rfl:  .  insulin degludec (TRESIBA FLEXTOUCH) 100 UNIT/ML SOPN FlexTouch Pen, Inject 40 Units into the skin daily. , Disp: , Rfl:  .  ketoconazole (NIZORAL) 2 % shampoo, APPLY TO AFFECTED AREA EVERY DAY AS NEEDED, Disp: , Rfl: 4 .  liraglutide (VICTOZA) 18 MG/3ML SOPN, INJECT 0.3 MLS (1.8 MG TOTAL) SUBCUTANEOUSLY ONCE DAILY., Disp: , Rfl:  .  metFORMIN (GLUCOPHAGE) 1000 MG tablet, TAKE 1 TABLET (1,000 MG TOTAL) BY MOUTH 2 (TWO) TIMES DAILY WITH MEALS., Disp: , Rfl: 1 .  NOVOLOG FLEXPEN 100 UNIT/ML FlexPen, Inject 0-10 Units into the skin daily as needed for high blood sugar (sliding scale). Reported on 02/24/2015, Disp: , Rfl: 0 .  [START ON 07/20/2017] oxyCODONE (OXY IR/ROXICODONE) 5 MG immediate release tablet, Take 1 tablet (5 mg total) by mouth every 6 (six) hours as needed for severe pain., Disp: 120 tablet, Rfl: 0 .  potassium citrate (UROCIT-K) 10 MEQ (1080 MG) SR tablet, Take 10 mEq by mouth 2 (two) times daily., Disp: , Rfl:  .  rosuvastatin (CRESTOR) 5 MG tablet, Take 5 mg by mouth 3 (three) times a week. Monday Wednesday Friday, Disp: , Rfl:  .  [START ON 06/20/2017] gabapentin (NEURONTIN) 600 MG tablet, Take 1 tablet (600 mg total) by mouth every 6 (six) hours., Disp: 360 tablet, Rfl: 0 .  [START ON 08/19/2017] oxyCODONE (OXY IR/ROXICODONE) 5 MG immediate release tablet, Take 1 tablet (5 mg total) by mouth every 6 (six) hours as needed for severe pain., Disp: 120 tablet, Rfl: 0 .  [START ON 06/20/2017] oxyCODONE (OXY IR/ROXICODONE) 5 MG immediate release tablet, Take 1 tablet (5 mg total) by mouth every 6 (six) hours as needed for severe pain., Disp: 120 tablet, Rfl: 0  ROS  Constitutional: Denies any fever or  chills Gastrointestinal: No reported hemesis, hematochezia, vomiting, or acute GI distress Musculoskeletal: Denies any acute onset joint swelling, redness, loss of ROM, or weakness Neurological: No reported episodes of acute onset apraxia, aphasia, dysarthria, agnosia, amnesia, paralysis, loss of coordination, or loss of consciousness  Allergies  Mr. Bowland has No Known Allergies.  [place]  Drug: Mr. Meyn  reports that he does not use drugs. Alcohol:  reports that he drinks alcohol. Tobacco:  reports that he has never smoked. He has never used smokeless tobacco. Medical:  has a past medical history of Arthritis, senescent (10/22/2014), Back pain, Chronic pain syndrome (10/22/2014), Diabetes mellitus without complication ([place]), History of kidney stones, IBS (irritable bowel syndrome), Lateral meniscus tear, Preventative health care, Sleep apnea, and Spinal stenosis. Surgical: Mr. Dunton  has a past surgical history that includes Tonsillectomy; Shoulder surgery; Palate surgery; Cervical fusion; Foot surgery; Lumbar laminectomy/decompression microdiscectomy (N/A, 02/06/2013); Knee arthroscopy (Right, 10/02/2013); Knee arthroscopy with medial menisectomy (Right, 03/25/2015); Knee bursectomy (Right, 03/25/2015); and  Cystoscopy w/ retrogrades (Right, 11/19/2015). Family: family history includes COPD in his mother; Cancer in his mother; Diabetes in his father; Heart disease in his father and mother.  Constitutional Exam  General appearance: Well nourished, well developed, and well hydrated. In no apparent acute distress Vitals:   06/05/17 0820  BP: 123/84  Pulse: 91  Temp: 98.3 F (36.8 C)  SpO2: 97%  Weight: 245 lb (111.1 kg)  Height: '5\' 6"'  (1.676 m)  Psych/Mental status: Alert, oriented x 3 (person, place, & time)       Eyes: PERLA Respiratory: No evidence of acute respiratory distress  Gait & Posture Assessment  Ambulation: Unassisted Gait: Relatively normal for age and body habitus Posture:  WNL   Lower Extremity Exam    Side: Right lower extremity  Side: Left lower extremity  Stability: No instability observed          Stability: No instability observed          Skin & Extremity Inspection: Skin color, temperature, and hair growth are WNL. No peripheral edema or cyanosis. No masses, redness, swelling, asymmetry, or associated skin lesions. No contractures.  Skin & Extremity Inspection: boot worn  Functional ROM: Unrestricted ROM                  Functional ROM: Unrestricted ROM                  Muscle Tone/Strength: Functionally intact. No obvious neuro-muscular anomalies detected.  Muscle Tone/Strength: Functionally intact. No obvious neuro-muscular anomalies detected.  Sensory (Neurological): Unimpaired  Sensory (Neurological): Unimpaired  Palpation: No palpable anomalies  Palpation: No palpable anomalies   Assessment  Primary Diagnosis & Pertinent Problem List: The primary encounter diagnosis was Lumbar spondylosis. Diagnoses of Lumbar facet syndrome (WC injury) (Bilateral) (R>L), Chronic low back pain (WC injury) (Primary Source of Pain) (Bilateral) (R>L), Neurogenic pain, and Chronic pain syndrome were also pertinent to this visit.  Status Diagnosis  Controlled Controlled Controlled 1. Lumbar spondylosis   2. Lumbar facet syndrome (WC injury) (Bilateral) (R>L)   3. Chronic low back pain (WC injury) (Primary Source of Pain) (Bilateral) (R>L)   4. Neurogenic pain   5. Chronic pain syndrome     Problems updated and reviewed during this visit: No problems updated. Plan of Care  Pharmacotherapy (Medications Ordered): Meds ordered this encounter  Medications  . gabapentin (NEURONTIN) 600 MG tablet    Sig: Take 1 tablet (600 mg total) by mouth every 6 (six) hours.    Dispense:  360 tablet    Refill:  0    Do not place this medication, or any other prescription from our practice, on "Automatic Refill". Patient may have prescription filled one day early if pharmacy is  closed on scheduled refill date.    Order Specific Question:   Supervising Provider    Answer:   Milinda Pointer [phone]  . oxyCODONE (OXY IR/ROXICODONE) 5 MG immediate release tablet    Sig: Take 1 tablet (5 mg total) by mouth every 6 (six) hours as needed for severe pain.    Dispense:  120 tablet    Refill:  0    Do not place this medication, or any other prescription from our practice, on "Automatic Refill". Patient may have prescription filled one day early if pharmacy is closed on scheduled refill date. Do not fill until: 08/19/2017 To last until:09/18/2017    Order Specific Question:   Supervising Provider    Answer:   Milinda Pointer [phone]  .  oxyCODONE (OXY IR/ROXICODONE) 5 MG immediate release tablet    Sig: Take 1 tablet (5 mg total) by mouth every 6 (six) hours as needed for severe pain.    Dispense:  120 tablet    Refill:  0    Do not place this medication, or any other prescription from our practice, on "Automatic Refill". Patient may have prescription filled one day early if pharmacy is closed on scheduled refill date. Do not fill until:07/20/2017 To last until: 08/19/2017    Order Specific Question:   Supervising Provider    Answer:   Milinda Pointer [phone]  . oxyCODONE (OXY IR/ROXICODONE) 5 MG immediate release tablet    Sig: Take 1 tablet (5 mg total) by mouth every 6 (six) hours as needed for severe pain.    Dispense:  120 tablet    Refill:  0    Do not place this medication, or any other prescription from our practice, on "Automatic Refill". Patient may have prescription filled one day early if pharmacy is closed on scheduled refill date. Do not fill until: 06/20/2017 To last until:07/20/2017    Order Specific Question:   Supervising Provider    Answer:   Milinda Pointer [[id number]]   New Prescriptions   No medications on file   Medications administered today: [name]. "Pilar Plate" had no medications administered during this visit. Lab-work,  procedure(s), and/or referral(s): No orders of the defined types were placed in this encounter.  Imaging and/or referral(s): None  Interventional management options: Planned, scheduled, and/or pending: Not at this time. Information given to patient to give to urology concerning the treatment of acute pain flares. No change in current medication regimen per Dr Lowella Dandy.    Considering: Palliativebilateral lumbar facet block Palliativebilateral lumbar facet radiofrequencyablation (Right side done 04/13/2016) (100% relief) Diagnostic right knee genicular nerve block.  Possible right knee genicular nerve radiofrequencyablation   Palliative PRN treatment(s): Diagnostic Right Suprascapular nerve Block#2 Possible Right Suprascapular nerveRFA    Provider-requested follow-up: Return in about 3 months (around 09/05/2017) for medical record release, MedMgmt with Me Donella Stade Edison Pace).  Future Appointments  Date Time Provider [place]  09/05/2017  8:30 AM Vevelyn Francois, NP [facility] None   Primary Care Physician: Patient, No Pcp Per Location: [facility] Outpatient Pain Management Facility Note by: Vevelyn Francois NP Date: 06/05/2017; Time: 11:01 AM  Pain Score Disclaimer: We use the NRS-11 scale. This is a self-reported, subjective measurement of pain severity with only modest accuracy. It is used primarily to identify changes within a particular patient. It must be understood that outpatient pain scales are significantly less accurate that those used for research, where they can be applied under ideal controlled circumstances with minimal exposure to variables. In reality, the score is likely to be a combination of pain intensity and pain affect, where pain affect describes the degree of emotional arousal or changes in action readiness caused by the sensory experience of pain. Factors such as social and work situation, setting, emotional state, anxiety levels, expectation, and  prior pain experience may influence pain perception and show large inter-individual differences that may also be affected by time variables.  Patient instructions provided during this appointment: Patient Instructions   ____________________________________________________________________________________________  Medication Rules  Applies to: All patients receiving prescriptions (written or electronic).  Pharmacy of record: Pharmacy where electronic prescriptions will be sent. If written prescriptions are taken to a different pharmacy, please inform the nursing staff. The pharmacy listed in the electronic medical record should be  the one where you would like electronic prescriptions to be sent.  Prescription refills: Only during scheduled appointments. Applies to both, written and electronic prescriptions.  NOTE: The following applies primarily to controlled substances (Opioid* Pain Medications).   Patient's responsibilities: 1. Pain Pills: Bring all pain pills to every appointment (except for procedure appointments). 2. Pill Bottles: Bring pills in original pharmacy bottle. Always bring newest bottle. Bring bottle, even if empty. 3. Medication refills: You are responsible for knowing and keeping track of what medications you need refilled. The day before your appointment, write a list of all prescriptions that need to be refilled. Bring that list to your appointment and give it to the admitting nurse. Prescriptions will be written only during appointments. If you forget a medication, it will not be "Called in", "Faxed", or "electronically sent". You will need to get another appointment to get these prescribed. 4. Prescription Accuracy: You are responsible for carefully inspecting your prescriptions before leaving our office. Have the discharge nurse carefully go over each prescription with you, before taking them home. Make sure that your name is accurately spelled, that your address is correct.  Check the name and dose of your medication to make sure it is accurate. Check the number of pills, and the written instructions to make sure they are clear and accurate. Make sure that you are given enough medication to last until your next medication refill appointment. 5. Taking Medication: Take medication as prescribed. Never take more pills than instructed. Never take medication more frequently than prescribed. Taking less pills or less frequently is permitted and encouraged, when it comes to controlled substances (written prescriptions).  6. Inform other Doctors: Always inform, all of your healthcare providers, of all the medications you take. 7. Pain Medication from other Providers: You are not allowed to accept any additional pain medication from any other Doctor or Healthcare provider. There are two exceptions to this rule. (see below) In the event that you require additional pain medication, you are responsible for notifying us, as stated below. 8. Medication Agreement: You are responsible for carefully reading and following our Medication Agreement. This must be signed before receiving any prescriptions from our practice. Safely store a copy of your signed Agreement. Violations to the Agreement will result in no further prescriptions. (Additional copies of our Medication Agreement are available upon request.) 9. Laws, Rules, & Regulations: All patients are expected to follow all Federal and [employer], [employer], Rules, [employer]. Ignorance of the Laws does not constitute a valid excuse. The use of any illegal substances is prohibited. 10. Adopted CDC guidelines & recommendations: Target dosing levels will be at or below 60 MME/day. Use of benzodiazepines** is not recommended.  Exceptions: There are only two exceptions to the rule of not receiving pain medications from other Healthcare Providers. 1. Exception #1 (Emergencies): In the event of an emergency (i.e.: accident requiring emergency  care), you are allowed to receive additional pain medication. However, you are responsible for: As soon as you are able, call our office [phone], at any time of the day or night, and leave a message stating your name, the date and nature of the emergency, and the name and dose of the medication prescribed. In the event that your call is answered by a member of our staff, make sure to document and save the date, time, and the name of the person that took your information.  2. Exception #2 (Planned Surgery): In the event that you are scheduled by another  doctor or dentist to have any type of surgery or procedure, you are allowed (for a period no longer than 30 days), to receive additional pain medication, for the acute post-op pain. However, in this case, you are responsible for picking up a copy of our "Post-op Pain Management for Surgeons" handout, and giving it to your surgeon or dentist. This document is available at our office, and does not require an appointment to obtain it. Simply go to our office during business hours (Monday-Thursday from 8:00 AM to 4:00 PM) (Friday 8:00 AM to 12:00 Noon) or if you have a scheduled appointment with [place], prior to your surgery, and ask for it by name. In addition, you will need to provide [place] with your name, name of your surgeon, type of surgery, and date of procedure or surgery.  *Opioid medications include: morphine, codeine, oxycodone, oxymorphone, hydrocodone, hydromorphone, meperidine, tramadol, tapentadol, buprenorphine, fentanyl, methadone. **Benzodiazepine medications include: diazepam (Valium), alprazolam (Xanax), clonazepam (Klonopine), lorazepam (Ativan), clorazepate (Tranxene), chlordiazepoxide (Librium), estazolam (Prosom), oxazepam (Serax), temazepam (Restoril), triazolam (Halcion) (Last updated: 03/16/2017) ____________________________________________________________________________________________    BMI Assessment: Estimated body mass index is  39.54 kg/m as calculated from the following:   Height as of this encounter: '5\' 6"'  (1.676 m).   Weight as of this encounter: 245 lb (111.1 kg).  BMI interpretation table: BMI level Category Range association with higher incidence of chronic pain  <18 kg/m2 Underweight   18.5-24.9 kg/m2 Ideal body weight   25-29.9 kg/m2 Overweight Increased incidence by 20%  30-34.9 kg/m2 Obese (Class I) Increased incidence by 68%  35-39.9 kg/m2 Severe obesity (Class II) Increased incidence by 136%  >40 kg/m2 Extreme obesity (Class III) Increased incidence by 254%   Patient's current BMI Ideal Body weight  Body mass index is 39.54 kg/m. Ideal body weight: 63.8 kg (140 lb 10.5 oz) Adjusted ideal body weight: 82.7 kg (182 lb 6.3 oz)   BMI Readings from Last 4 Encounters:  06/05/17 39.54 kg/m  03/06/17 39.54 kg/m  01/23/17 39.54 kg/m  12/07/16 40.35 kg/m   Wt Readings from Last 4 Encounters:  06/05/17 245 lb (111.1 kg)  03/06/17 245 lb (111.1 kg)  01/23/17 245 lb (111.1 kg)  12/07/16 250 lb (113.4 kg)

## 2017-06-05 NOTE — Progress Notes (Signed)
Nursing Pain Medication Assessment:  Safety precautions to be maintained throughout the outpatient stay will include: orient to surroundings, keep bed in low position, maintain call bell within reach at all times, provide assistance with transfer out of bed and ambulation.  Medication Inspection Compliance: Pill count conducted under aseptic conditions, in front of the patient. Neither the pills nor the bottle was removed from the patient's sight at any time. Once count was completed pills were immediately returned to the patient in their original bottle.  Medication: Oxycodone IR Pill/Patch Count: 79 of 120 pills remain Pill/Patch Appearance: Markings consistent with prescribed medication Bottle Appearance: Standard pharmacy container. Clearly labeled. Filled Date: 5 / 8 / 2019 Last Medication intake:  Today

## 2017-06-05 NOTE — Telephone Encounter (Signed)
Attempted to call patient, message left. 

## 2017-06-05 NOTE — Telephone Encounter (Signed)
Patient lvmail asking to speak with [facility]

## 2017-06-26 ENCOUNTER — Telehealth: Payer: Self-pay | Admitting: *Deleted

## 2017-06-26 NOTE — Telephone Encounter (Signed)
Called to verify Rx for oxycodone 5 mg that was written on 06/20/17.  Verification given that Thad Rangerrystal King NP did write that Rx for this patient.

## 2017-08-15 IMAGING — CR DG SI JOINTS 3+V
3 series · 3 of 3 positions shown · non-contrast
Comparison: None.

CLINICAL DATA: Chronic low back pain after injury 6 years ago.

EXAM:
BILATERAL SACROILIAC JOINTS - 3+ VIEW

[si joints ap]
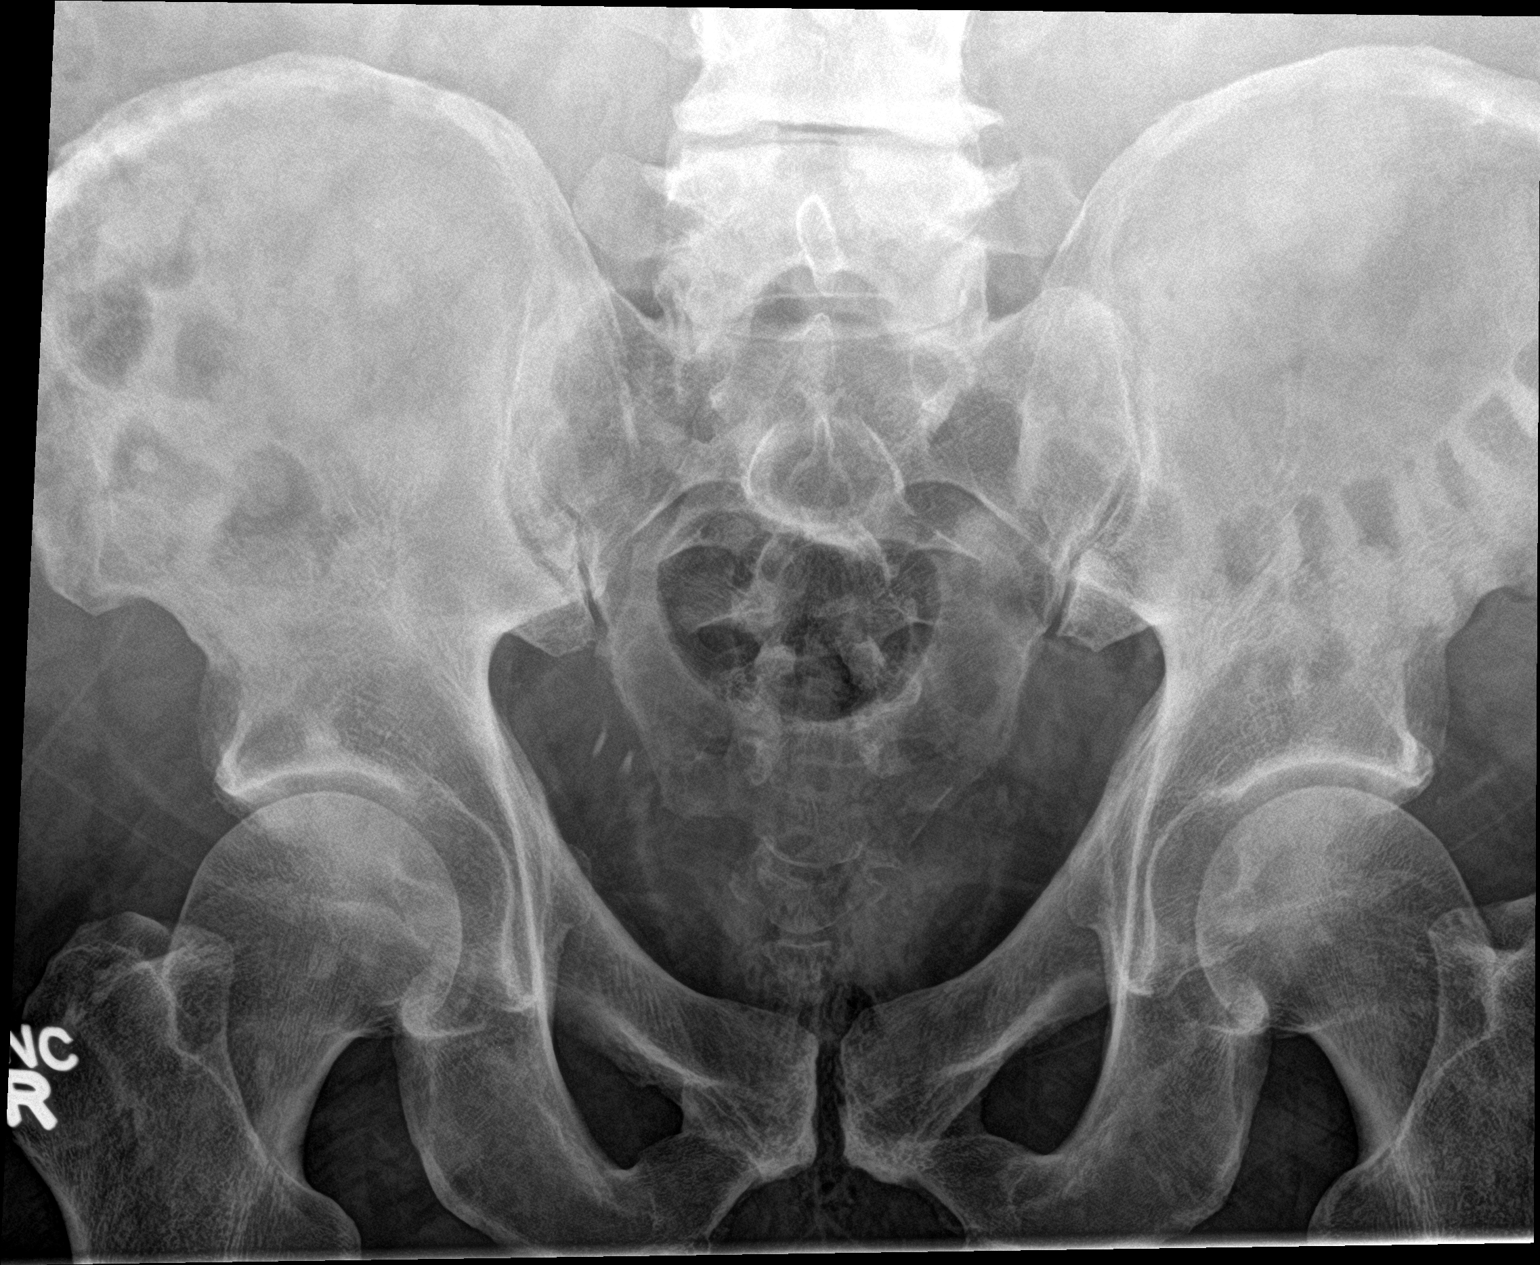

[si joints obl (1 of 2)]
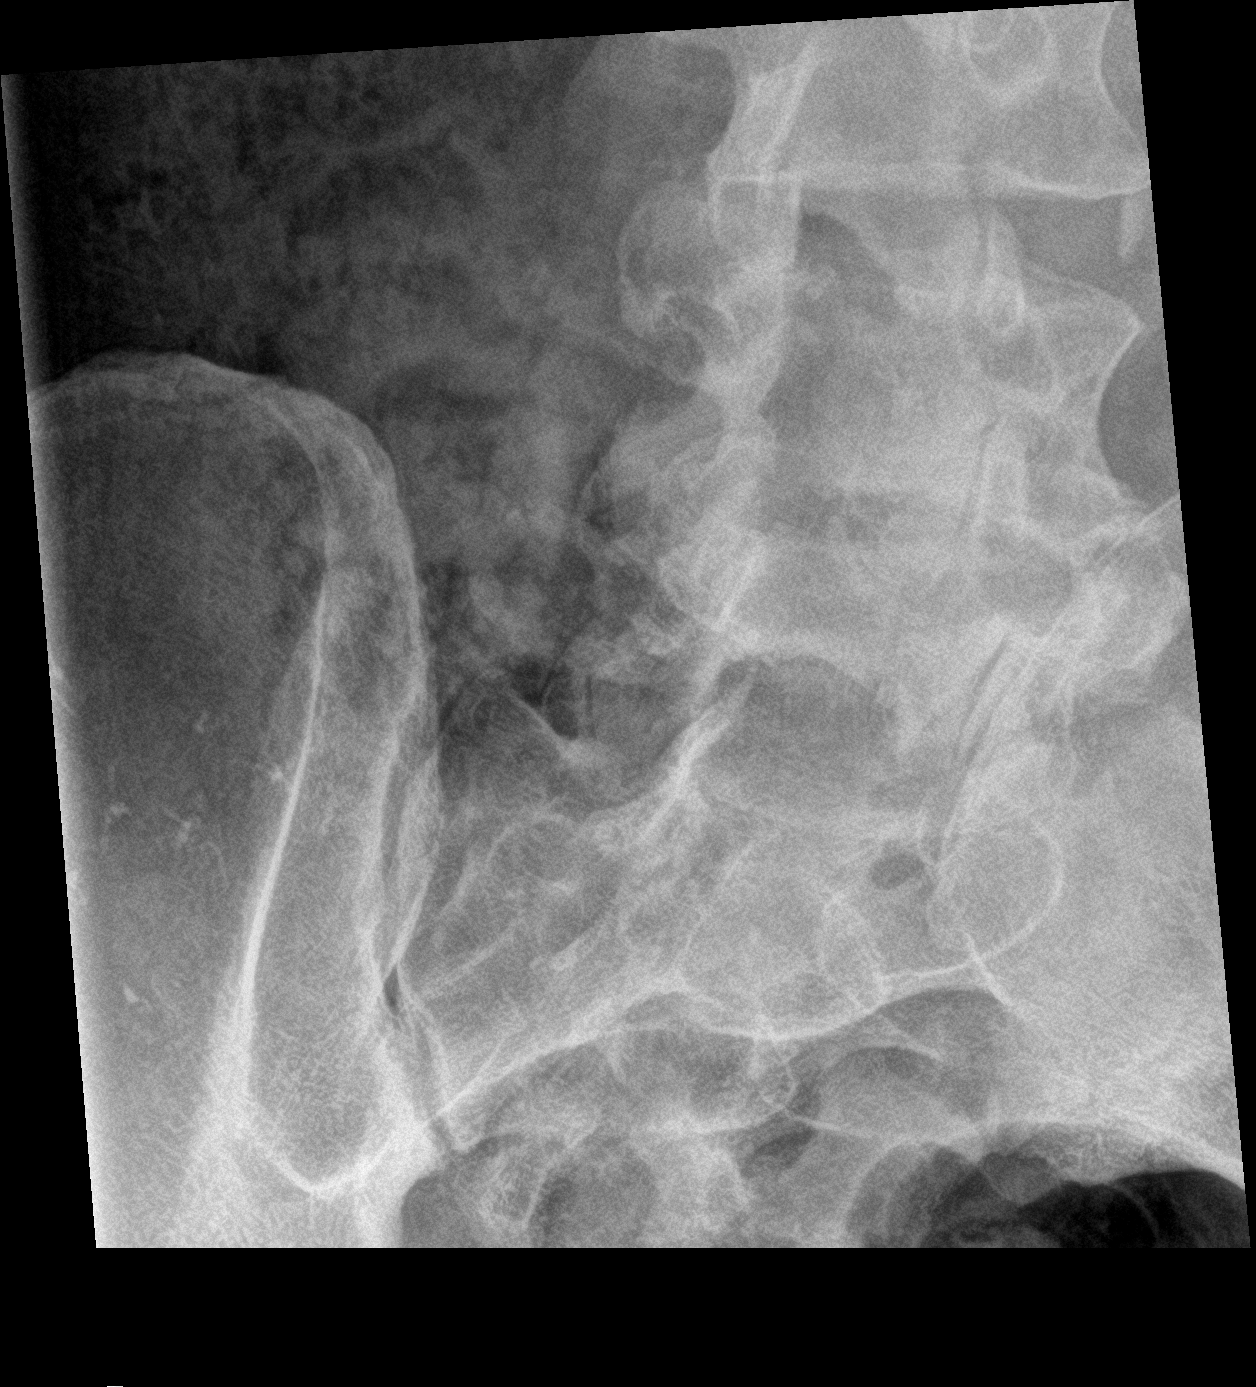

[si joints obl (2 of 2)]
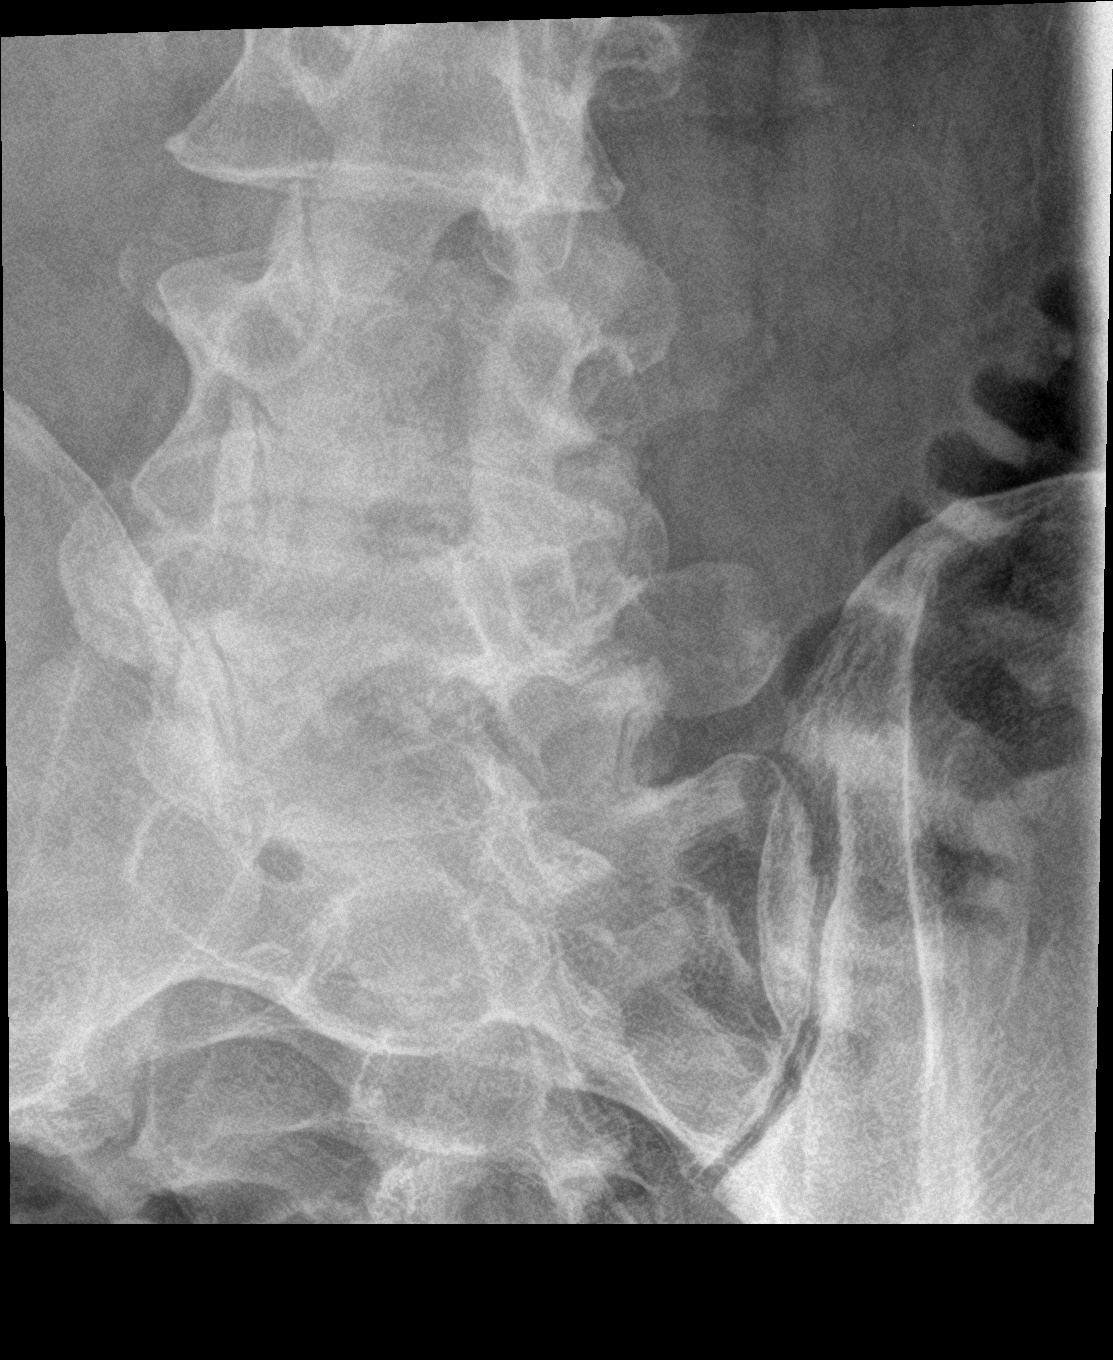

[3 of 3 positions shown; findings below may reference images not displayed]

FINDINGS: The sacroiliac joint spaces are maintained and there is no evidence
of arthropathy. No other bone abnormalities are seen.
IMPRESSION: Normal sacroiliac joints.

## 2017-09-05 ENCOUNTER — Ambulatory Visit: Payer: BLUE CROSS/BLUE SHIELD | Attending: Nurse Practitioner | Admitting: Nurse Practitioner

## 2017-09-05 ENCOUNTER — Other Ambulatory Visit: Payer: Self-pay

## 2017-09-05 ENCOUNTER — Encounter: Payer: Self-pay | Admitting: Nurse Practitioner

## 2017-09-05 VITALS — BP 128/80 | HR 80 | Temp 98.0°F | Resp 16 | Ht 66.0 in | Wt 250.0 lb

## 2017-09-05 DIAGNOSIS — G894 Chronic pain syndrome: Secondary | ICD-10-CM | POA: Insufficient documentation

## 2017-09-05 DIAGNOSIS — Z79899 Other long term (current) drug therapy: Secondary | ICD-10-CM | POA: Insufficient documentation

## 2017-09-05 DIAGNOSIS — Z79891 Long term (current) use of opiate analgesic: Secondary | ICD-10-CM | POA: Insufficient documentation

## 2017-09-05 DIAGNOSIS — E785 Hyperlipidemia, unspecified: Secondary | ICD-10-CM | POA: Insufficient documentation

## 2017-09-05 DIAGNOSIS — M533 Sacrococcygeal disorders, not elsewhere classified: Secondary | ICD-10-CM

## 2017-09-05 DIAGNOSIS — Z794 Long term (current) use of insulin: Secondary | ICD-10-CM | POA: Insufficient documentation

## 2017-09-05 DIAGNOSIS — Z6841 Body Mass Index (BMI) 40.0 and over, adult: Secondary | ICD-10-CM | POA: Insufficient documentation

## 2017-09-05 DIAGNOSIS — M48061 Spinal stenosis, lumbar region without neurogenic claudication: Secondary | ICD-10-CM | POA: Insufficient documentation

## 2017-09-05 DIAGNOSIS — E1169 Type 2 diabetes mellitus with other specified complication: Secondary | ICD-10-CM | POA: Diagnosis not present

## 2017-09-05 DIAGNOSIS — S83289A Other tear of lateral meniscus, current injury, unspecified knee, initial encounter: Secondary | ICD-10-CM | POA: Insufficient documentation

## 2017-09-05 DIAGNOSIS — E1165 Type 2 diabetes mellitus with hyperglycemia: Secondary | ICD-10-CM | POA: Diagnosis not present

## 2017-09-05 DIAGNOSIS — G8929 Other chronic pain: Secondary | ICD-10-CM

## 2017-09-05 DIAGNOSIS — K589 Irritable bowel syndrome without diarrhea: Secondary | ICD-10-CM | POA: Insufficient documentation

## 2017-09-05 DIAGNOSIS — Z87442 Personal history of urinary calculi: Secondary | ICD-10-CM | POA: Diagnosis not present

## 2017-09-05 DIAGNOSIS — M47816 Spondylosis without myelopathy or radiculopathy, lumbar region: Secondary | ICD-10-CM | POA: Diagnosis not present

## 2017-09-05 DIAGNOSIS — M792 Neuralgia and neuritis, unspecified: Secondary | ICD-10-CM

## 2017-09-05 DIAGNOSIS — M545 Low back pain: Secondary | ICD-10-CM | POA: Diagnosis present

## 2017-09-05 DIAGNOSIS — X58XXXA Exposure to other specified factors, initial encounter: Secondary | ICD-10-CM | POA: Diagnosis not present

## 2017-09-05 DIAGNOSIS — M4726 Other spondylosis with radiculopathy, lumbar region: Secondary | ICD-10-CM | POA: Insufficient documentation

## 2017-09-05 MED ORDER — OXYCODONE HCL 5 MG PO TABS: 5.0000 mg | ORAL_TABLET | Freq: Four times a day (QID) | ORAL | 0 refills | Status: DC | PRN

## 2017-09-05 MED ORDER — GABAPENTIN 600 MG PO TABS: 600.0000 mg | ORAL_TABLET | Freq: Four times a day (QID) | ORAL | 0 refills | Status: DC

## 2017-09-05 NOTE — Progress Notes (Signed)
Patient's Name: Adam Petty.  MRN: [id number]  Referring Provider: No ref. provider found  DOB: Dec 12, 1960  PCP: Patient, No Pcp Per  DOS: 09/05/2017  Note by: Vevelyn Francois NP  Service setting: Ambulatory outpatient  Specialty: Interventional Pain Management  Location: [facility] (AMB) Pain Management Facility    Patient type: Established    Primary Reason(s) for Visit: Encounter for prescription drug management. (Level of risk: moderate)  CC: Back Pain (lower)  HPI  Adam Petty is a 57 y.o. year old, male patient, who comes today for a medication management evaluation. 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); Discogenic syndrome, lumbar (WC injury); Osteoarthritis of spine with radiculopathy, lumbar region (WC injury); Lumbar facet syndrome (WC injury) (Bilateral) (R>L); Lumbosacral radiculopathy (WC injury); Pain in right knee (WC injury); Obesity, Class III, BMI 40-49.9 (morbid obesity) ([place]); Uncomplicated opioid dependence ([place]); Therapeutic opioid-induced constipation (OIC); Dyslipidemia; Microalbuminuria; Adiposity; Lumbar canal stenosis; Type 2 diabetes mellitus with hyperglycemia, with long-term current use of insulin ([place]); Long term prescription opiate use; Encounter for chronic pain management; Neurogenic pain; Neuropathic pain; Musculoskeletal pain; Lumbar spondylosis; Chronic knee pain (Secondary source of pain) (Right); Osteoarthritis of knee (Location of Secondary source of pain) (Right); Chronic shoulder pain [facility] source of pain) (Right); Osteoarthritis of shoulder (Location of Tertiary source of pain) (Right); Chronic lower extremity pain (Right); Recurrent nephrolithiasis; Acute medial meniscal tear; Chronic pain  syndrome (WC injury); Acute low back pain without sciatica; Osteoarthritis of glenohumeral joint (Right); Chronic sacroiliac joint pain (Bilateral) (R>L); and Hyperlipidemia due to type 2 diabetes mellitus (HCC) on their problem list. His primarily concern today is the Back Pain (lower)  Pain Assessment: Location: Lower, Right Back Radiating: denies Onset: More than a month ago Duration: Chronic pain Quality: Constant, Spasm Severity: 4 /10 (subjective, self-reported pain score)  Note: Reported level is compatible with observation.                          Effect on ADL: with meds, able to do most tasks Timing: Constant Modifying factors: meds BP: 128/80  HR: 80  Adam Petty was last scheduled for an appointment on 06/05/2017 for medication management. During today's appointment we reviewed Adam Petty chronic pain status, as well as his outpatient medication regimen. He admits that his pain normally stays in the back however it does radiate across from right to left. He also admits that it goes down into his feet. He is status post lumbar epidural steroid injection which was not effective. He admits that he continues to have flare-ups with kidney stones. He admits that his lifestyle was approximately 1 month ago. He denies any changes with urology treatment. He admits that he has some good days and some bad days. He admits that yesterday was a bad day. He denies any side effects of his current medications or any new concerns today.  The patient  reports that he does not use drugs. His body mass index is 40.35 kg/m.  Further details on both, my assessment(s), as well as the proposed treatment plan, please see below.  Controlled Substance Pharmacotherapy Assessment REMS (Risk Evaluation and Mitigation Strategy)  Analgesic: Oxycodone IR 5 mg 1 tablet by mouth every 6 hours (20 mg/day)  MME/day: 30 mg/day Rise Patience, RN  09/05/2017  8:47 AM  Signed ` 2qaNursing Pain Medication  Assessment:  Safety precautions to be maintained throughout the outpatient stay will include: orient to surroundings, keep bed in low position, maintain call bell within reach at all times, provide assistance with transfer out of bed and ambulation.  Medication Inspection Compliance: Pill count conducted under aseptic conditions, in front of the patient. Neither the pills nor the bottle was removed from the patient's sight at any time. Once count was completed pills were immediately returned to the patient in their original bottle.  Medication: Oxycodone IR Pill/Patch Count: 82 of 120 pills remain Pill/Patch Appearance: Markings consistent with prescribed medication Bottle Appearance: Standard pharmacy container. Clearly labeled. Filled Date: 8 / 9 / 2019 Last Medication intake:  Today   Pharmacokinetics: Liberation and absorption (onset of action): WNL Distribution (time to peak effect): WNL Metabolism and excretion (duration of action): WNL         Pharmacodynamics: Desired effects: Analgesia: Adam Petty reports >50% benefit. Functional ability: Patient reports that medication allows him to accomplish basic ADLs Clinically meaningful improvement in function (CMIF): Sustained CMIF goals met Perceived effectiveness: Described as relatively effective, allowing for increase in activities of daily living (ADL) Undesirable effects: Side-effects or Adverse reactions: None reported Monitoring: [place] PMP: Online review of the past 70-monthperiod conducted. Compliant with practice rules and regulations Last UDS on record: Summary  Date Value Ref Range Status  03/06/2017 FINAL  Final    Comment:    ==================================================================== TOXASSURE SELECT 13 (MW) ==================================================================== Test                             Result       Flag       Units Drug Present and Declared for Prescription Verification   Oxycodone                       1386         EXPECTED   ng/mg creat   Oxymorphone                    1011         EXPECTED   ng/mg creat   Noroxycodone                   1650         EXPECTED   ng/mg creat   Noroxymorphone                 283          EXPECTED   ng/mg creat    Sources of oxycodone are scheduled prescription medications.    Oxymorphone, noroxycodone, and noroxymorphone are expected    metabolites of oxycodone. Oxymorphone is also available as a    scheduled prescription medication. ==================================================================== Test                      Result    Flag   Units      Ref Range   Creatinine              169              mg/dL      >=20 ==================================================================== Declared Medications:  The flagging and interpretation on this report are based on the  following declared medications.  Unexpected results may arise  from  inaccuracies in the declared medications.  **Note: The testing scope of this panel includes these medications:  Oxycodone  **Note: The testing scope of this panel does not include following  reported medications:  Cyanocobalamin  Gabapentin  Insulin  Ketoconazole  Liraglutide  Metformin  Potassium  Rosuvastatin ==================================================================== For clinical consultation, please call [phone]. ====================================================================    UDS interpretation: Compliant          Medication Assessment Form: Reviewed. Patient indicates being compliant with therapy Treatment compliance: Compliant Risk Assessment Profile: Aberrant behavior: See prior evaluations. None observed or detected today Comorbid factors increasing risk of overdose: See prior notes. No additional risks detected today Opioid risk tool (ORT) (Total Score): 0 Personal History of Substance Abuse (SUD-Substance use disorder):  Alcohol: Negative  Illegal Drugs:  Negative  Rx Drugs: Negative  ORT Risk Level calculation: Low Risk Risk of substance use disorder (SUD): Low Opioid Risk Tool - 09/05/17 0842      Family History of Substance Abuse   Alcohol  Negative    Illegal Drugs  Negative    Rx Drugs  Negative      Personal History of Substance Abuse   Alcohol  Negative    Illegal Drugs  Negative    Rx Drugs  Negative      Age   Age between 50-45 years   No      History of Preadolescent Sexual Abuse   History of Preadolescent Sexual Abuse  Negative or Male      Psychological Disease   Psychological Disease  Negative    Depression  Negative      Total Score   Opioid Risk Tool Scoring  0    Opioid Risk Interpretation  Low Risk      ORT Scoring interpretation table:  Score <3 = Low Risk for SUD  Score between 4-7 = Moderate Risk for SUD  Score >8 = High Risk for Opioid Abuse   Risk Mitigation Strategies:  Patient Counseling: Covered Patient-Prescriber Agreement (PPA): Present and active  Notification to other healthcare providers: Done  Pharmacologic Plan: No change in therapy, at this time.             Laboratory Chemistry  Inflammation Markers (CRP: Acute Phase) (ESR: Chronic Phase) Lab Results  Component Value Date   CRP 0.6 03/12/2015   ESRSEDRATE 9 03/12/2015                         Rheumatology Markers No results found for: RF, ANA, LABURIC, URICUR, LYMEIGGIGMAB, LYMEABIGMQN, HLAB27                      Renal Function Markers Lab Results  Component Value Date   BUN 15 03/12/2015   CREATININE 0.82 03/12/2015   GFRAA >60 03/12/2015   GFRNONAA >60 03/12/2015                             Hepatic Function Markers Lab Results  Component Value Date   AST 41 03/12/2015   ALT 55 03/12/2015   ALBUMIN 4.2 03/12/2015   ALKPHOS 89 03/12/2015                        Electrolytes Lab Results  Component Value Date   NA 140 03/12/2015   K 4.2 03/12/2015   CL 105 03/12/2015   CALCIUM 10.0 03/12/2015  MG 1.8  03/12/2015                        Neuropathy Markers Lab Results  Component Value Date   HGBA1C 9.2 (H) 08/31/2012                        Bone Pathology Markers No results found for: VD25OH, MV784ON6EXB, MW4132GM0, NU2725DG6, 25OHVITD1, 25OHVITD2, 25OHVITD3, TESTOFREE, TESTOSTERONE                       Coagulation Parameters Lab Results  Component Value Date   PLT 176 03/13/2015                        Cardiovascular Markers Lab Results  Component Value Date   CKTOTAL 184 08/31/2012   CKMB 1.1 08/31/2012   TROPONINI < 0.02 08/31/2012   HGB 15.3 03/13/2015   HCT 46.4 03/13/2015                         CA Markers No results found for: CEA, CA125, LABCA2                      Note: Lab results reviewed.  Recent Diagnostic Imaging Results  DG C-Arm 1-60 Min-No Report Fluoroscopy was utilized by the requesting physician.  No radiographic  interpretation.   Complexity Note: Imaging results reviewed. Results shared with Adam Petty, using Layman's terms.                         Meds   Current Outpatient Medications:  .  cyanocobalamin (,VITAMIN B-12,) 1000 MCG/ML injection, Inject into the muscle every 30 (thirty) days. , Disp: , Rfl:  .  [START ON 09/24/2017] gabapentin (NEURONTIN) 600 MG tablet, Take 1 tablet (600 mg total) by mouth every 6 (six) hours., Disp: 360 tablet, Rfl: 0 .  ibuprofen (ADVIL,MOTRIN) 800 MG tablet, Take 800 mg by mouth every 8 (eight) hours as needed., Disp: , Rfl:  .  insulin degludec (TRESIBA FLEXTOUCH) 100 UNIT/ML SOPN FlexTouch Pen, Inject 40 Units into the skin daily. , Disp: , Rfl:  .  ketoconazole (NIZORAL) 2 % shampoo, APPLY TO AFFECTED AREA EVERY DAY AS NEEDED, Disp: , Rfl: 4 .  liraglutide (VICTOZA) 18 MG/3ML SOPN, INJECT 0.3 MLS (1.8 MG TOTAL) SUBCUTANEOUSLY ONCE DAILY., Disp: , Rfl:  .  metFORMIN (GLUCOPHAGE) 1000 MG tablet, TAKE 1 TABLET (1,000 MG TOTAL) BY MOUTH 2 (TWO) TIMES DAILY WITH MEALS., Disp: , Rfl: 1 .  naproxen sodium (ALEVE)  220 MG tablet, Take 440 mg by mouth., Disp: , Rfl:  .  NOVOLOG FLEXPEN 100 UNIT/ML FlexPen, Inject 0-10 Units into the skin daily as needed for high blood sugar (sliding scale). Reported on 02/24/2015, Disp: , Rfl: 0 .  [START ON 11/23/2017] oxyCODONE (OXY IR/ROXICODONE) 5 MG immediate release tablet, Take 1 tablet (5 mg total) by mouth every 6 (six) hours as needed for severe pain., Disp: 120 tablet, Rfl: 0 .  potassium citrate (UROCIT-K) 10 MEQ (1080 MG) SR tablet, Take 10 mEq by mouth 2 (two) times daily., Disp: , Rfl:  .  rosuvastatin (CRESTOR) 5 MG tablet, Take 5 mg by mouth 3 (three) times a week. Monday Wednesday Friday, Disp: , Rfl:  .  [START ON 10/24/2017] oxyCODONE (OXY IR/ROXICODONE) 5 MG immediate release tablet, Take  1 tablet (5 mg total) by mouth every 6 (six) hours as needed for severe pain., Disp: 120 tablet, Rfl: 0 .  [START ON 09/24/2017] oxyCODONE (OXY IR/ROXICODONE) 5 MG immediate release tablet, Take 1 tablet (5 mg total) by mouth every 6 (six) hours as needed for severe pain., Disp: 120 tablet, Rfl: 0  ROS  Constitutional: Denies any fever or chills Gastrointestinal: No reported hemesis, hematochezia, vomiting, or acute GI distress Musculoskeletal: Denies any acute onset joint swelling, redness, loss of ROM, or weakness Neurological: No reported episodes of acute onset apraxia, aphasia, dysarthria, agnosia, amnesia, paralysis, loss of coordination, or loss of consciousness  Allergies  Adam Petty has No Known Allergies.  [place]  Drug: Adam Petty  reports that he does not use drugs. Alcohol:  reports that he drinks alcohol. Tobacco:  reports that he has never smoked. He has never used smokeless tobacco. Medical:  has a past medical history of Arthritis, senescent (10/22/2014), Back pain, Chronic pain syndrome (10/22/2014), Diabetes mellitus without complication ([employer]), History of kidney stones, IBS (irritable bowel syndrome), Lateral meniscus tear, Preventative health care, Sleep  apnea, and Spinal stenosis. Surgical: Adam Petty  has a past surgical history that includes Tonsillectomy; Shoulder surgery; Palate surgery; Cervical fusion; Foot surgery; Lumbar laminectomy/decompression microdiscectomy (N/A, 02/06/2013); Knee arthroscopy (Right, 10/02/2013); Knee arthroscopy with medial menisectomy (Right, 03/25/2015); Knee bursectomy (Right, 03/25/2015); and Cystoscopy w/ retrogrades (Right, 11/19/2015). Family: family history includes COPD in his mother; Cancer in his mother; Diabetes in his father; Heart disease in his father and mother.  Constitutional Exam  General appearance: alert, cooperative, in no distress and morbidly obese Vitals:   09/05/17 0836  BP: 128/80  Pulse: 80  Resp: 16  Temp: 98 F (36.7 C)  TempSrc: Oral  SpO2: 97%  Weight: 250 lb (113.4 kg)  Height: '5\' 6"'  (1.676 m)  Psych/Mental status: Alert, oriented x 3 (person, place, & time)       Eyes: PERLA Respiratory: No evidence of acute respiratory distress  Lumbar Spine Area Exam  Skin & Axial Inspection: No masses, redness, or swelling Alignment: Symmetrical Functional ROM: Unrestricted ROM       Stability: No instability detected Muscle Tone/Strength: Functionally intact. No obvious neuro-muscular anomalies detected. Sensory (Neurological): Unimpaired Palpation: No palpable anomalies        Gait & Posture Assessment  Ambulation: Unassisted Gait: Relatively normal for age and body habitus Posture: WNL   Lower Extremity Exam    Side: Right lower extremity  Side: Left lower extremity  Stability: No instability observed          Stability: No instability observed          Skin & Extremity Inspection: Skin color, temperature, and hair growth are WNL. No peripheral edema or cyanosis. No masses, redness, swelling, asymmetry, or associated skin lesions. No contractures.  Skin & Extremity Inspection: Skin color, temperature, and hair growth are WNL. No peripheral edema or cyanosis. No masses, redness,  swelling, asymmetry, or associated skin lesions. No contractures.  Functional ROM: Unrestricted ROM                  Functional ROM: Unrestricted ROM                  Muscle Tone/Strength: Functionally intact. No obvious neuro-muscular anomalies detected.  Muscle Tone/Strength: Functionally intact. No obvious neuro-muscular anomalies detected.  Sensory (Neurological): Unimpaired  Sensory (Neurological): Unimpaired  Palpation: No palpable anomalies  Palpation: No palpable anomalies   Assessment  Primary Diagnosis &  Pertinent Problem List: The primary encounter diagnosis was Lumbar spondylosis. Diagnoses of Chronic sacroiliac joint pain (Bilateral) (R>L), Neurogenic pain, and Chronic pain syndrome were also pertinent to this visit.  Status Diagnosis  Persistent Persistent Persistent 1. Lumbar spondylosis   2. Chronic sacroiliac joint pain (Bilateral) (R>L)   3. Neurogenic pain   4. Chronic pain syndrome     Problems updated and reviewed during this visit: Problem  Type 2 Diabetes Mellitus With Hyperglycemia, With Long-Term Current Use of Insulin (Hcc)  Hyperlipidemia Due to Type 2 Diabetes Mellitus (Hcc)   Plan of Care  Pharmacotherapy (Medications Ordered): Meds ordered this encounter  Medications  . gabapentin (NEURONTIN) 600 MG tablet    Sig: Take 1 tablet (600 mg total) by mouth every 6 (six) hours.    Dispense:  360 tablet    Refill:  0    Do not place this medication, or any other prescription from our practice, on "Automatic Refill". Patient may have prescription filled one day early if pharmacy is closed on scheduled refill date.    Order Specific Question:   Supervising Provider    Answer:   Milinda Pointer [phone]  . oxyCODONE (OXY IR/ROXICODONE) 5 MG immediate release tablet    Sig: Take 1 tablet (5 mg total) by mouth every 6 (six) hours as needed for severe pain.    Dispense:  120 tablet    Refill:  0    Do not place this medication on "Automatic Refill". Patient  may have prescription filled one day early if pharmacy is closed on scheduled refill date. Do not fill until: 11/23/2017 To last until:12/23/2017    Order Specific Question:   Supervising Provider    Answer:   Milinda Pointer [phone]  . oxyCODONE (OXY IR/ROXICODONE) 5 MG immediate release tablet    Sig: Take 1 tablet (5 mg total) by mouth every 6 (six) hours as needed for severe pain.    Dispense:  120 tablet    Refill:  0    Do not place this medication on "Automatic Refill". Patient may have prescription filled one day early if pharmacy is closed on scheduled refill date. Do not fill until:10/24/2017 To last until: 11/23/2017    Order Specific Question:   Supervising Provider    Answer:   Milinda Pointer [phone]  . oxyCODONE (OXY IR/ROXICODONE) 5 MG immediate release tablet    Sig: Take 1 tablet (5 mg total) by mouth every 6 (six) hours as needed for severe pain.    Dispense:  120 tablet    Refill:  0    Do not place this medication on "Automatic Refill". Patient may have prescription filled one day early if pharmacy is closed on scheduled refill date. Do not fill until: 09/24/2017 To last until:10/24/2017    Order Specific Question:   Supervising Provider    Answer:   Milinda Pointer [[id number]]   New Prescriptions   No medications on file   Medications administered today: [name]. "Adam Petty" had no medications administered during this visit. Lab-work, procedure(s), and/or referral(s): No orders of the defined types were placed in this encounter.  Imaging and/or referral(s): None  Interventional management options: Planned, scheduled, and/or pending: Not at this time.    Considering: Palliativebilateral lumbar facet block Palliativebilateral lumbar facet radiofrequencyablation (Right side done 04/13/2016) (100% relief) Diagnostic right knee genicular nerve block.  Possible right knee genicular nerve radiofrequencyablation   Palliative PRN  treatment(s): Diagnostic Right Suprascapular nerve Block#2 Possible Right Suprascapular nerveRFA  Provider-requested follow-up: Return in about 3 months (around 12/06/2017) for MedMgmt with Me Donella Stade Edison Pace).  Future Appointments  Date Time Provider [place]  12/07/2017  8:45 AM Vevelyn Francois, NP [facility] [place] None   Primary Care Physician: Patient, No Pcp Per Location: [facility] Outpatient Pain Management Facility Note by: Vevelyn Francois NP Date: 09/05/2017; Time: 11:09 AM  Pain Score Disclaimer: We use the NRS-11 scale. This is a self-reported, subjective measurement of pain severity with only modest accuracy. It is used primarily to identify changes within a particular patient. It must be understood that outpatient pain scales are significantly less accurate that those used for research, where they can be applied under ideal controlled circumstances with minimal exposure to variables. In reality, the score is likely to be a combination of pain intensity and pain affect, where pain affect describes the degree of emotional arousal or changes in action readiness caused by the sensory experience of pain. Factors such as social and work situation, setting, emotional state, anxiety levels, expectation, and prior pain experience may influence pain perception and show large inter-individual differences that may also be affected by time variables.  Patient instructions provided during this appointment: Patient Instructions   Rx for gabapentin and oxycodone to last until 12/07/2019_have been escribed to your pharmacy.___________________________________________________________________________________________  Medication Rules  Applies to: All patients receiving prescriptions (written or electronic).  Pharmacy of record: Pharmacy where electronic prescriptions will be sent. If written prescriptions are taken to a different pharmacy, please inform the nursing staff. The pharmacy listed in  the electronic medical record should be the one where you would like electronic prescriptions to be sent.  Prescription refills: Only during scheduled appointments. Applies to both, written and electronic prescriptions.  NOTE: The following applies primarily to controlled substances (Opioid* Pain Medications).   Patient's responsibilities: 1. Pain Pills: Bring all pain pills to every appointment (except for procedure appointments). 2. Pill Bottles: Bring pills in original pharmacy bottle. Always bring newest bottle. Bring bottle, even if empty. 3. Medication refills: You are responsible for knowing and keeping track of what medications you need refilled. The day before your appointment, write a list of all prescriptions that need to be refilled. Bring that list to your appointment and give it to the admitting nurse. Prescriptions will be written only during appointments. If you forget a medication, it will not be "Called in", "Faxed", or "electronically sent". You will need to get another appointment to get these prescribed. 4. Prescription Accuracy: You are responsible for carefully inspecting your prescriptions before leaving our office. Have the discharge nurse carefully go over each prescription with you, before taking them home. Make sure that your name is accurately spelled, that your address is correct. Check the name and dose of your medication to make sure it is accurate. Check the number of pills, and the written instructions to make sure they are clear and accurate. Make sure that you are given enough medication to last until your next medication refill appointment. 5. Taking Medication: Take medication as prescribed. Never take more pills than instructed. Never take medication more frequently than prescribed. Taking less pills or less frequently is permitted and encouraged, when it comes to controlled substances (written prescriptions).  6. Inform other Doctors: Always inform, all of your  healthcare providers, of all the medications you take. 7. Pain Medication from other Providers: You are not allowed to accept any additional pain medication from any other Doctor or Healthcare provider. There are two exceptions to this rule. (see  below) In the event that you require additional pain medication, you are responsible for notifying us, as stated below. 8. Medication Agreement: You are responsible for carefully reading and following our Medication Agreement. This must be signed before receiving any prescriptions from our practice. Safely store a copy of your signed Agreement. Violations to the Agreement will result in no further prescriptions. (Additional copies of our Medication Agreement are available upon request.) 9. Laws, Rules, & Regulations: All patients are expected to follow all Federal and [employer], [employer], Rules, [employer]. Ignorance of the Laws does not constitute a valid excuse. The use of any illegal substances is prohibited. 10. Adopted CDC guidelines & recommendations: Target dosing levels will be at or below 60 MME/day. Use of benzodiazepines** is not recommended.  Exceptions: There are only two exceptions to the rule of not receiving pain medications from other Healthcare Providers. 1. Exception #1 (Emergencies): In the event of an emergency (i.e.: accident requiring emergency care), you are allowed to receive additional pain medication. However, you are responsible for: As soon as you are able, call our office [phone], at any time of the day or night, and leave a message stating your name, the date and nature of the emergency, and the name and dose of the medication prescribed. In the event that your call is answered by a member of our staff, make sure to document and save the date, time, and the name of the person that took your information.  2. Exception #2 (Planned Surgery): In the event that you are scheduled by another doctor or dentist to have any type of  surgery or procedure, you are allowed (for a period no longer than 30 days), to receive additional pain medication, for the acute post-op pain. However, in this case, you are responsible for picking up a copy of our "Post-op Pain Management for Surgeons" handout, and giving it to your surgeon or dentist. This document is available at our office, and does not require an appointment to obtain it. Simply go to our office during business hours (Monday-Thursday from 8:00 AM to 4:00 PM) (Friday 8:00 AM to 12:00 Noon) or if you have a scheduled appointment with [place], prior to your surgery, and ask for it by name. In addition, you will need to provide [place] with your name, name of your surgeon, type of surgery, and date of procedure or surgery.  *Opioid medications include: morphine, codeine, oxycodone, oxymorphone, hydrocodone, hydromorphone, meperidine, tramadol, tapentadol, buprenorphine, fentanyl, methadone. **Benzodiazepine medications include: diazepam (Valium), alprazolam (Xanax), clonazepam (Klonopine), lorazepam (Ativan), clorazepate (Tranxene), chlordiazepoxide (Librium), estazolam (Prosom), oxazepam (Serax), temazepam (Restoril), triazolam (Halcion) (Last updated: 03/16/2017) ____________________________________________________________________________________________   BMI Assessment: Estimated body mass index is 40.35 kg/m as calculated from the following:   Height as of this encounter: '5\' 6"'  (1.676 m).   Weight as of this encounter: 250 lb (113.4 kg).  BMI interpretation table: BMI level Category Range association with higher incidence of chronic pain  <18 kg/m2 Underweight   18.5-24.9 kg/m2 Ideal body weight   25-29.9 kg/m2 Overweight Increased incidence by 20%  30-34.9 kg/m2 Obese (Class I) Increased incidence by 68%  35-39.9 kg/m2 Severe obesity (Class II) Increased incidence by 136%  >40 kg/m2 Extreme obesity (Class III) Increased incidence by 254%   Patient's current BMI Ideal Body weight   Body mass index is 40.35 kg/m. Ideal body weight: 63.8 kg (140 lb 10.5 oz) Adjusted ideal body weight: 83.6 kg (184 lb 6.3 oz)   BMI Readings from Last 4  Encounters:  09/05/17 40.35 kg/m  06/05/17 39.54 kg/m  03/06/17 39.54 kg/m  01/23/17 39.54 kg/m   Wt Readings from Last 4 Encounters:  09/05/17 250 lb (113.4 kg)  06/05/17 245 lb (111.1 kg)  03/06/17 245 lb (111.1 kg)  01/23/17 245 lb (111.1 kg)

## 2017-09-05 NOTE — Progress Notes (Signed)
`   2qaNursing Pain Medication Assessment:  Safety precautions to be maintained throughout the outpatient stay will include: orient to surroundings, keep bed in low position, maintain call bell within reach at all times, provide assistance with transfer out of bed and ambulation.  Medication Inspection Compliance: Pill count conducted under aseptic conditions, in front of the patient. Neither the pills nor the bottle was removed from the patient's sight at any time. Once count was completed pills were immediately returned to the patient in their original bottle.  Medication: Oxycodone IR Pill/Patch Count: 82 of 120 pills remain Pill/Patch Appearance: Markings consistent with prescribed medication Bottle Appearance: Standard pharmacy container. Clearly labeled. Filled Date: 8 / 9 / 2019 Last Medication intake:  Today

## 2017-09-05 NOTE — Patient Instructions (Addendum)
Rx for gabapentin and oxycodone to last until 12/07/2019_have been escribed to your pharmacy.___________________________________________________________________________________________  Medication Rules  Applies to: All patients receiving prescriptions (written or electronic).  Pharmacy of record: Pharmacy where electronic prescriptions will be sent. If written prescriptions are taken to a different pharmacy, please inform the nursing staff. The pharmacy listed in the electronic medical record should be the one where you would like electronic prescriptions to be sent.  Prescription refills: Only during scheduled appointments. Applies to both, written and electronic prescriptions.  NOTE: The following applies primarily to controlled substances (Opioid* Pain Medications).   Patient's responsibilities: 1. Pain Pills: Bring all pain pills to every appointment (except for procedure appointments). 2. Pill Bottles: Bring pills in original pharmacy bottle. Always bring newest bottle. Bring bottle, even if empty. 3. Medication refills: You are responsible for knowing and keeping track of what medications you need refilled. The day before your appointment, write a list of all prescriptions that need to be refilled. Bring that list to your appointment and give it to the admitting nurse. Prescriptions will be written only during appointments. If you forget a medication, it will not be "Called in", "Faxed", or "electronically sent". You will need to get another appointment to get these prescribed. 4. Prescription Accuracy: You are responsible for carefully inspecting your prescriptions before leaving our office. Have the discharge nurse carefully go over each prescription with you, before taking them home. Make sure that your name is accurately spelled, that your address is correct. Check the name and dose of your medication to make sure it is accurate. Check the number of pills, and the written instructions to  make sure they are clear and accurate. Make sure that you are given enough medication to last until your next medication refill appointment. 5. Taking Medication: Take medication as prescribed. Never take more pills than instructed. Never take medication more frequently than prescribed. Taking less pills or less frequently is permitted and encouraged, when it comes to controlled substances (written prescriptions).  6. Inform other Doctors: Always inform, all of your healthcare providers, of all the medications you take. 7. Pain Medication from other Providers: You are not allowed to accept any additional pain medication from any other Doctor or Healthcare provider. There are two exceptions to this rule. (see below) In the event that you require additional pain medication, you are responsible for notifying us, as stated below. 8. Medication Agreement: You are responsible for carefully reading and following our Medication Agreement. This must be signed before receiving any prescriptions from our practice. Safely store a copy of your signed Agreement. Violations to the Agreement will result in no further prescriptions. (Additional copies of our Medication Agreement are available upon request.) 9. Laws, Rules, & Regulations: All patients are expected to follow all [address] and [place] DisneyState Laws, [employer]utes, Rules, [place]& Regulations. Ignorance of the Laws does not constitute a valid excuse. The use of any illegal substances is prohibited. 10. Adopted CDC guidelines & recommendations: Target dosing levels will be at or below 60 MME/day. Use of benzodiazepines** is not recommended.  Exceptions: There are only two exceptions to the rule of not receiving pain medications from other Healthcare Providers. 1. Exception #1 (Emergencies): In the event of an emergency (i.e.: accident requiring emergency care), you are allowed to receive additional pain medication. However, you are responsible for: As soon as you are able, call our  office [phone], at any time of the day or night, and leave a message stating your name, the date  and nature of the emergency, and the name and dose of the medication prescribed. In the event that your call is answered by a member of our staff, make sure to document and save the date, time, and the name of the person that took your information.  2. Exception #2 (Planned Surgery): In the event that you are scheduled by another doctor or dentist to have any type of surgery or procedure, you are allowed (for a period no longer than 30 days), to receive additional pain medication, for the acute post-op pain. However, in this case, you are responsible for picking up a copy of our "Post-op Pain Management for Surgeons" handout, and giving it to your surgeon or dentist. This document is available at our office, and does not require an appointment to obtain it. Simply go to our office during business hours (Monday-Thursday from 8:00 AM to 4:00 PM) (Friday 8:00 AM to 12:00 Noon) or if you have a scheduled appointment with us, prior to your surgery, and ask for it by name. In addition, you will need to provide us with your name, name of your surgeon, type of surgery, and date of procedure or surgery.  *Opioid medications include: morphine, codeine, oxycodone, oxymorphone, hydrocodone, hydromorphone, meperidine, tramadol, tapentadol, buprenorphine, fentanyl, methadone. **Benzodiazepine medications include: diazepam (Valium), alprazolam (Xanax), clonazepam (Klonopine), lorazepam (Ativan), clorazepate (Tranxene), chlordiazepoxide (Librium), estazolam (Prosom), oxazepam (Serax), temazepam (Restoril), triazolam (Halcion) (Last updated: 03/16/2017) ____________________________________________________________________________________________   BMI Assessment: Estimated body mass index is 40.35 kg/m as calculated from the following:   Height as of this encounter: 5\' 6"  (1.676 m).   Weight as of this encounter: 250 lb  (113.4 kg).  BMI interpretation table: BMI level Category Range association with higher incidence of chronic pain  <18 kg/m2 Underweight   18.5-24.9 kg/m2 Ideal body weight   25-29.9 kg/m2 Overweight Increased incidence by 20%  30-34.9 kg/m2 Obese (Class I) Increased incidence by 68%  35-39.9 kg/m2 Severe obesity (Class II) Increased incidence by 136%  >40 kg/m2 Extreme obesity (Class III) Increased incidence by 254%   Patient's current BMI Ideal Body weight  Body mass index is 40.35 kg/m. Ideal body weight: 63.8 kg (140 lb 10.5 oz) Adjusted ideal body weight: 83.6 kg (184 lb 6.3 oz)   BMI Readings from Last 4 Encounters:  09/05/17 40.35 kg/m  06/05/17 39.54 kg/m  03/06/17 39.54 kg/m  01/23/17 39.54 kg/m   Wt Readings from Last 4 Encounters:  09/05/17 250 lb (113.4 kg)  06/05/17 245 lb (111.1 kg)  03/06/17 245 lb (111.1 kg)  01/23/17 245 lb (111.1 kg)

## 2017-09-08 DIAGNOSIS — E538 Deficiency of other specified B group vitamins: Secondary | ICD-10-CM | POA: Insufficient documentation

## 2017-09-29 NOTE — H&P (Signed)
NAME: Adam BergBRIDGES JR., Adam W. MEDICAL RECORD [id number] ACCOUNT [id number] DATE OF BIRTH:01/10/1961 FACILITY: [facility] LOCATION:  PHYSICIAN:Trinidee Schrag Gilles Chiquito. Zymere Patlan, MD  HISTORY AND PHYSICAL  DATE OF ADMISSION:  10/03/2017  SAME DAY SURGERY:  09/17.  CHIEF COMPLAINT:  Right flank pain.  HISTORY OF PRESENT ILLNESS:  The patient is a 57 year old white male who presented to the office with a 2 to 3-week history of right flank pain.  He has a history of uric acid stone disease.  IVP in the office revealed delayed excretion on the right with  hydronephrosis.  He comes in now for right retrograde with probable right ureteroscopic ureterolithotomy with holmium laser lithotripsy.  ALLERGIES:  No drug allergies.  CURRENT MEDICATIONS:  Metformin, gabapentin, ramipril, Crestor, Rapaflo, oxycodone and Aleve.  PAST SURGICAL HISTORY: 1.  Appendectomy in 1974. 2.  Tonsillectomy with UPP for sleep apnea in 2010. 3.  C-spine fusion in 2010. 4.  Right foot surgery in 2011. 5.  Ureteroscopic ureterolithotomy with stent placement in 2010. 6.  Left shoulder repair in 2015. 7.  Right knee repair 2017. 8.  Right shoulder reconstructive surgery x5 from 2012-2015.   9.   Lumbar fusion in 2016.   10.  Right ureteroscopic ureterolithotomy 2017.  PAST AND CURRENT MEDICAL CONDITIONS:   1.  Diabetes. 2.  Chronic back and knee pain. 3.  Sleep apnea. 4.  History of recurrent uric acid kidney stone disease.  REVIEW OF SYSTEMS:  The patient has chronic back and knee pain.  Denies chest pain, shortness of breath or stroke.  PHYSICAL EXAMINATION: GENERAL:  Obese white male in no acute distress. HEENT:  Sclerae were clear.  Pupils are equally round, reactive to light and accommodation.  Extraocular movements are intact. NECK:  Supple, no palpable cervical adenopathy.  No audible carotid bruits. PULMONARY:  Lungs clear to auscultation. CARDIOVASCULAR:  Regular rhythm and rate without audible murmurs. ABDOMEN:   Soft, nontender abdomen. GENITOURINARY:  Circumcised, testes smooth, nontender. RECTAL:  Deferred. NEUROMUSCULAR:  Alert and oriented x3.  IMPRESSION:  Probable right uric acid calculus with renal colic.  PLAN:  Cystoscopy with right retrograde and right ureteroscopic ureterolithotomy with holmium laser lithotripsy.  TN/NUANCE  D:09/28/2017 T:09/28/2017 JOB:[id number]

## 2017-10-02 ENCOUNTER — Encounter
Admission: RE | Admit: 2017-10-02 | Discharge: 2017-10-02 | Disposition: A | Discharge status: Home / Self Care | Payer: BLUE CROSS/BLUE SHIELD | Source: Ambulatory Visit | Attending: Urology | Admitting: Urology

## 2017-10-02 ENCOUNTER — Other Ambulatory Visit: Payer: Self-pay

## 2017-10-02 ENCOUNTER — Telehealth: Payer: Self-pay | Admitting: *Deleted

## 2017-10-02 DIAGNOSIS — N201 Calculus of ureter: Secondary | ICD-10-CM | POA: Diagnosis not present

## 2017-10-02 DIAGNOSIS — E119 Type 2 diabetes mellitus without complications: Secondary | ICD-10-CM | POA: Insufficient documentation

## 2017-10-02 DIAGNOSIS — Z01818 Encounter for other preprocedural examination: Secondary | ICD-10-CM | POA: Insufficient documentation

## 2017-10-02 LAB — BASIC METABOLIC PANEL
Anion gap: 10 (ref 5–15)
BUN: 12 mg/dL (ref 6–20)
CHLORIDE: 103 mmol/L (ref 98–111)
CO2: 24 mmol/L (ref 22–32)
CREATININE: 1.06 mg/dL (ref 0.61–1.24)
Calcium: 8.8 mg/dL — ABNORMAL LOW (ref 8.9–10.3)
GFR calc Af Amer: >60 mL/min (ref >60)
Glucose, Bld: 361 mg/dL — ABNORMAL HIGH (ref 70–99)
Potassium: 4.2 mmol/L (ref 3.5–5.1)
SODIUM: 137 mmol/L (ref 135–145)

## 2017-10-02 NOTE — Telephone Encounter (Signed)
Returned patient call.  Patient states his kidney Doctor( Dr Artis FlockWolfe)  is prescribing him Hydrocodone 5/500 mg for kidney stones.   [facility]  does not carry this.  He was calling to inform us that he would need to go to another pharmacy for this medication.  Informed patient to let us know which pharmacy he would use for this medication once he found one that carried it.  Informed patient that this was documented in his chart.

## 2017-10-02 NOTE — Patient Instructions (Signed)
Your procedure is scheduled on: Tomorrow at 12:00 Report to Day Surgery.   Remember: Instructions that are not followed completely may result in serious medical risk,  up to and including death, or upon the discretion of your surgeon and anesthesiologist your  surgery may need to be rescheduled.     _X__ 1. Do not eat food after midnight the night before your procedure.                 No gum chewing or hard candies. You may drink clear liquids up to 2 hours                 before you are scheduled to arrive for your surgery- DO not drink clear                 liquids within 2 hours of the start of your surgery.                 Clear Liquids include:  water, apple juice without pulp, clear carbohydrate                 drink such as Clearfast of Gatorade, Black Coffee or Tea (Do not add                 anything to coffee or tea).  __X__2.  On the morning of surgery brush your teeth with toothpaste and water, you                may rinse your mouth with mouthwash if you wish.  Do not swallow any toothpaste of mouthwash.     ___ 3.  No Alcohol for 24 hours before or after surgery.   ___ 4.  Do Not Smoke or use e-cigarettes For 24 Hours Prior to Your Surgery.                 Do not use any chewable tobacco products for at least 6 hours prior to                 surgery.  ____  5.  Bring all medications with you on the day of surgery if instructed.   __x__  6.  Notify your doctor if there is any change in your medical condition      (cold, fever, infections).     Do not wear jewelry, make-up, hairpins, clips or nail polish. Do not wear lotions, powders, or perfumes. You may wear deodorant. Do not shave 48 hours prior to surgery. Men may shave face and neck. Do not bring valuables to the hospital.    [facility] is not responsible for any belongings or valuables.  Contacts, dentures or bridgework may not be worn into surgery. Leave your suitcase in the car. After  surgery it may be brought to your room. For patients admitted to the hospital, discharge time is determined by your treatment team.   Patients discharged the day of surgery will not be allowed to drive home.   Please read over the following fact sheets that you were given:    _x___ Take these medicines the morning of surgery with A SIP OF WATER:    1. gabapentin (NEURONTIN) 600 MG tablet  2. oxyCODONE (OXY IR/ROXICODONE) 5 MG immediate release tablet  3.   4.  5.  6.  ____ Fleet Enema (as directed)   ____ Use CHG Soap as directed  ____ Use inhalers on the day of surgery  __x__ Stop  metformin today    __x__ Take 1/2 of usual insulin dose the night before surgery. No insulin the morning          of surgery.   ____ Stop Coumadin/Plavix/aspirin on   ___x_ Stop Anti-inflammatories on now  Tylenol is OK   ____ Stop supplements until after surgery.    ____ Bring C-Pap to the hospital.

## 2017-10-02 NOTE — Pre-Procedure Instructions (Signed)
DR FITZGERALD NOTIFIED GLU. 361. [facility]. SPOKE WITH DR [facility]OLFF WHO STATES STONE OBSTRUCTION,PAIN, PREDNISONE CONTRIBUTING TO ELEVATED GLU. NOTIFIED DR FITZPATRICK THAT DR WOLFF WOULD LIKE HER TO CALL HIM AT OFFICE.

## 2017-10-02 NOTE — Pre-Procedure Instructions (Signed)
During preop interview patient admitted to taking only 30 of insulin degludec(Tresiba) instead of 40.His sugars have been running over 300 due to prednisone injections for chronic pain.  He took his Metformin this morning.  Labs drawn today.

## 2017-10-03 ENCOUNTER — Ambulatory Visit
Admission: RE | Admit: 2017-10-03 | Discharge: 2017-10-03 | Disposition: A | Discharge status: Home / Self Care | Payer: BLUE CROSS/BLUE SHIELD | Source: Ambulatory Visit | Attending: Urology | Admitting: Urology

## 2017-10-03 ENCOUNTER — Other Ambulatory Visit: Payer: Self-pay

## 2017-10-03 ENCOUNTER — Encounter: Payer: Self-pay | Admitting: *Deleted

## 2017-10-03 ENCOUNTER — Ambulatory Visit: Payer: BLUE CROSS/BLUE SHIELD | Admitting: Anesthesiology

## 2017-10-03 ENCOUNTER — Encounter
Admission: RE | Disposition: A | Discharge status: Home / Self Care | Payer: Self-pay | Source: Ambulatory Visit | Attending: Urology

## 2017-10-03 DIAGNOSIS — Z7984 Long term (current) use of oral hypoglycemic drugs: Secondary | ICD-10-CM | POA: Diagnosis not present

## 2017-10-03 DIAGNOSIS — N200 Calculus of kidney: Secondary | ICD-10-CM | POA: Diagnosis not present

## 2017-10-03 DIAGNOSIS — G4730 Sleep apnea, unspecified: Secondary | ICD-10-CM | POA: Insufficient documentation

## 2017-10-03 DIAGNOSIS — M25569 Pain in unspecified knee: Secondary | ICD-10-CM | POA: Diagnosis not present

## 2017-10-03 DIAGNOSIS — Z87442 Personal history of urinary calculi: Secondary | ICD-10-CM | POA: Insufficient documentation

## 2017-10-03 DIAGNOSIS — E119 Type 2 diabetes mellitus without complications: Secondary | ICD-10-CM | POA: Insufficient documentation

## 2017-10-03 DIAGNOSIS — M549 Dorsalgia, unspecified: Secondary | ICD-10-CM | POA: Insufficient documentation

## 2017-10-03 DIAGNOSIS — Z6841 Body Mass Index (BMI) 40.0 and over, adult: Secondary | ICD-10-CM | POA: Insufficient documentation

## 2017-10-03 DIAGNOSIS — Z79899 Other long term (current) drug therapy: Secondary | ICD-10-CM | POA: Diagnosis not present

## 2017-10-03 DIAGNOSIS — G8929 Other chronic pain: Secondary | ICD-10-CM | POA: Insufficient documentation

## 2017-10-03 DIAGNOSIS — Z981 Arthrodesis status: Secondary | ICD-10-CM | POA: Insufficient documentation

## 2017-10-03 HISTORY — PX: URETEROSCOPY: SHX842

## 2017-10-03 HISTORY — PX: CYSTOSCOPY W/ RETROGRADES: SHX1426

## 2017-10-03 LAB — GLUCOSE, CAPILLARY
GLUCOSE-CAPILLARY: 206 mg/dL — AB (ref 70–99)
Glucose-Capillary: 199 mg/dL — ABNORMAL HIGH (ref 70–99)

## 2017-10-03 SURGERY — CYSTOSCOPY, WITH RETROGRADE PYELOGRAM
Anesthesia: General | Site: Ureter | Laterality: Right

## 2017-10-03 MED ORDER — EPHEDRINE SULFATE 50 MG/ML IJ SOLN
INTRAMUSCULAR | Status: AC
  Filled 2017-10-03: qty 1

## 2017-10-03 MED ORDER — FAMOTIDINE 20 MG PO TABS: 20.0000 mg | ORAL_TABLET | Freq: Once | ORAL | Status: DC

## 2017-10-03 MED ORDER — ROCURONIUM BROMIDE 100 MG/10ML IV SOLN
INTRAVENOUS | Status: DC | PRN
  Administered 2017-10-03: 50 mg via INTRAVENOUS
  Administered 2017-10-03: 20 mg via INTRAVENOUS

## 2017-10-03 MED ORDER — KETOROLAC TROMETHAMINE 30 MG/ML IJ SOLN
INTRAMUSCULAR | Status: AC
  Filled 2017-10-03: qty 1

## 2017-10-03 MED ORDER — DEXAMETHASONE SODIUM PHOSPHATE 10 MG/ML IJ SOLN
INTRAMUSCULAR | Status: AC
  Filled 2017-10-03: qty 1

## 2017-10-03 MED ORDER — LEVOFLOXACIN 500 MG PO TABS
ORAL_TABLET | ORAL | Status: AC
  Filled 2017-10-03: qty 1

## 2017-10-03 MED ORDER — BELLADONNA ALKALOIDS-OPIUM 16.2-60 MG RE SUPP
RECTAL | Status: AC
  Filled 2017-10-03: qty 1

## 2017-10-03 MED ORDER — DEXAMETHASONE SODIUM PHOSPHATE 10 MG/ML IJ SOLN
INTRAMUSCULAR | Status: DC | PRN
  Administered 2017-10-03: 5 mg via INTRAVENOUS

## 2017-10-03 MED ORDER — ONDANSETRON HCL 4 MG/2ML IJ SOLN
INTRAMUSCULAR | Status: AC
  Filled 2017-10-03: qty 2

## 2017-10-03 MED ORDER — IOPAMIDOL (ISOVUE-200) INJECTION 41%
INTRAVENOUS | Status: DC | PRN
  Administered 2017-10-03: 15 mL

## 2017-10-03 MED ORDER — PROPOFOL 10 MG/ML IV BOLUS
INTRAVENOUS | Status: AC
  Filled 2017-10-03: qty 20

## 2017-10-03 MED ORDER — FENTANYL CITRATE (PF) 100 MCG/2ML IJ SOLN
INTRAMUSCULAR | Status: AC
  Administered 2017-10-03: 25 ug via INTRAVENOUS
  Filled 2017-10-03: qty 2

## 2017-10-03 MED ORDER — ONDANSETRON HCL 4 MG/2ML IJ SOLN: 4.0000 mg | Freq: Once | INTRAMUSCULAR | Status: DC | PRN

## 2017-10-03 MED ORDER — FENTANYL CITRATE (PF) 100 MCG/2ML IJ SOLN
25.0000 ug | INTRAMUSCULAR | Status: DC | PRN
  Administered 2017-10-03 (×2): 25 ug via INTRAVENOUS

## 2017-10-03 MED ORDER — PROPOFOL 10 MG/ML IV BOLUS
INTRAVENOUS | Status: DC | PRN
  Administered 2017-10-03: 200 mg via INTRAVENOUS

## 2017-10-03 MED ORDER — FENTANYL CITRATE (PF) 100 MCG/2ML IJ SOLN
INTRAMUSCULAR | Status: AC
  Filled 2017-10-03: qty 2

## 2017-10-03 MED ORDER — LEVOFLOXACIN IN D5W 500 MG/100ML IV SOLN
INTRAVENOUS | Status: AC
  Filled 2017-10-03: qty 100

## 2017-10-03 MED ORDER — LEVOFLOXACIN IN D5W 500 MG/100ML IV SOLN
500.0000 mg | Freq: Once | INTRAVENOUS | Status: AC
  Administered 2017-10-03: 500 mg via INTRAVENOUS

## 2017-10-03 MED ORDER — SUGAMMADEX SODIUM 500 MG/5ML IV SOLN
INTRAVENOUS | Status: AC
  Filled 2017-10-03: qty 5

## 2017-10-03 MED ORDER — MIDAZOLAM HCL 2 MG/2ML IJ SOLN
INTRAMUSCULAR | Status: AC
  Filled 2017-10-03: qty 2

## 2017-10-03 MED ORDER — FAMOTIDINE 20 MG PO TABS
ORAL_TABLET | ORAL | Status: AC
  Administered 2017-10-03: 20 mg
  Filled 2017-10-03: qty 1

## 2017-10-03 MED ORDER — BELLADONNA ALKALOIDS-OPIUM 16.2-60 MG RE SUPP
RECTAL | Status: DC | PRN
  Administered 2017-10-03: 1 via RECTAL

## 2017-10-03 MED ORDER — KETOROLAC TROMETHAMINE 30 MG/ML IJ SOLN
INTRAMUSCULAR | Status: DC | PRN
  Administered 2017-10-03: 30 mg via INTRAVENOUS

## 2017-10-03 MED ORDER — EPHEDRINE SULFATE 50 MG/ML IJ SOLN
INTRAMUSCULAR | Status: DC | PRN
  Administered 2017-10-03: 10 mg via INTRAVENOUS

## 2017-10-03 MED ORDER — ROCURONIUM BROMIDE 50 MG/5ML IV SOLN
INTRAVENOUS | Status: AC
  Filled 2017-10-03: qty 1

## 2017-10-03 MED ORDER — LIDOCAINE HCL (PF) 2 % IJ SOLN
INTRAMUSCULAR | Status: AC
  Filled 2017-10-03: qty 10

## 2017-10-03 MED ORDER — LIDOCAINE HCL URETHRAL/MUCOSAL 2 % EX GEL
CUTANEOUS | Status: AC
  Filled 2017-10-03: qty 10

## 2017-10-03 MED ORDER — SODIUM CHLORIDE 0.9 % IV SOLN
INTRAVENOUS | Status: DC
  Administered 2017-10-03: 13:00:00 via INTRAVENOUS

## 2017-10-03 MED ORDER — FENTANYL CITRATE (PF) 100 MCG/2ML IJ SOLN
INTRAMUSCULAR | Status: DC | PRN
  Administered 2017-10-03: 100 ug via INTRAVENOUS

## 2017-10-03 MED ORDER — PHENYLEPHRINE HCL 10 MG/ML IJ SOLN
INTRAMUSCULAR | Status: DC | PRN
  Administered 2017-10-03 (×3): 200 ug via INTRAVENOUS

## 2017-10-03 MED ORDER — LEVOFLOXACIN IN D5W 500 MG/100ML IV SOLN: 500.0000 mg | Freq: Once | INTRAVENOUS | Status: DC

## 2017-10-03 MED ORDER — MIDAZOLAM HCL 2 MG/2ML IJ SOLN
INTRAMUSCULAR | Status: DC | PRN
  Administered 2017-10-03: 2 mg via INTRAVENOUS

## 2017-10-03 MED ORDER — SUGAMMADEX SODIUM 500 MG/5ML IV SOLN
INTRAVENOUS | Status: DC | PRN
  Administered 2017-10-03: 240 mg via INTRAVENOUS

## 2017-10-03 MED ORDER — ONDANSETRON HCL 4 MG/2ML IJ SOLN
INTRAMUSCULAR | Status: DC | PRN
  Administered 2017-10-03: 4 mg via INTRAVENOUS

## 2017-10-03 MED ORDER — LIDOCAINE HCL (CARDIAC) PF 100 MG/5ML IV SOSY
PREFILLED_SYRINGE | INTRAVENOUS | Status: DC | PRN
  Administered 2017-10-03: 100 mg via INTRAVENOUS

## 2017-10-03 MED ORDER — LIDOCAINE HCL URETHRAL/MUCOSAL 2 % EX GEL
CUTANEOUS | Status: DC | PRN
  Administered 2017-10-03: 1 via URETHRAL

## 2017-10-03 SURGICAL SUPPLY — 27 items
BAG DRAIN CYSTO-URO LG1000N (MISCELLANEOUS) ×3 IMPLANT
BRUSH SCRUB EZ 1% IODOPHOR (MISCELLANEOUS) ×3 IMPLANT
CATH URETL 5X70 OPEN END (CATHETERS) ×3 IMPLANT
CNTNR SPEC 2.5X3XGRAD LEK (MISCELLANEOUS)
CONT SPEC 4OZ STER OR WHT (MISCELLANEOUS)
CONT SPEC 4OZ STRL OR WHT (MISCELLANEOUS)
CONTAINER SPEC 2.5X3XGRAD LEK (MISCELLANEOUS) IMPLANT
FIBER LASER 365 (Laser) IMPLANT
GLOVE BIO SURGEON STRL SZ7.5 (GLOVE) ×3 IMPLANT
GOWN STRL REUS W/ TWL LRG LVL3 (GOWN DISPOSABLE) ×2 IMPLANT
GOWN STRL REUS W/ TWL LRG LVL4 (GOWN DISPOSABLE) ×2 IMPLANT
GOWN STRL REUS W/ TWL XL LVL3 (GOWN DISPOSABLE) ×2 IMPLANT
GOWN STRL REUS W/TWL LRG LVL3 (GOWN DISPOSABLE) ×3
GOWN STRL REUS W/TWL LRG LVL4 (GOWN DISPOSABLE) ×3
GOWN STRL REUS W/TWL XL LVL3 (GOWN DISPOSABLE) ×3
GUIDEWIRE STR ZIPWIRE 035X150 (MISCELLANEOUS) ×3 IMPLANT
KIT TURNOVER CYSTO (KITS) ×3 IMPLANT
PACK CYSTO AR (MISCELLANEOUS) ×3 IMPLANT
SET CYSTO W/LG BORE CLAMP LF (SET/KITS/TRAYS/PACK) ×3 IMPLANT
SOL .9 NS 3000ML IRR  AL (IV SOLUTION) ×1
SOL .9 NS 3000ML IRR AL (IV SOLUTION) ×2
SOL .9 NS 3000ML IRR UROMATIC (IV SOLUTION) ×2 IMPLANT
SOL PREP PVP 2OZ (MISCELLANEOUS) ×3
SOLUTION PREP PVP 2OZ (MISCELLANEOUS) ×2 IMPLANT
SURGILUBE 2OZ TUBE FLIPTOP (MISCELLANEOUS) ×3 IMPLANT
SYR 10ML LL (SYRINGE) ×3 IMPLANT
WATER STERILE IRR 1000ML POUR (IV SOLUTION) ×3 IMPLANT

## 2017-10-03 NOTE — Op Note (Signed)
Preoperative diagnosis: 1.  Right renal colic                                             2.  Right ureterolithiasis  Postoperative diagnosis: Passed kidney stone  Procedure: 1.  Right ureteroscopy                      2.  Retrograde pyelography                      3.  Fluoroscopy  Surgeon: Suszanne ConnersMichael R. Evelene CroonWolff MD  Anesthesia: General  Indications:See the history and physical. After informed consent the above procedure(s) were requested     Technique and findings: After adequate general anesthesia had been obtained patient was placed into dorsal lithotomy position and the perineum was prepped and draped in the usual fashion.  Fluoroscopy was performed and no calcified urinary calculi could be identified  The 21 French cystoscope was coupled the camera and then visually advanced into the bladder.  The bladder was thoroughly inspected.  Both ureteral orifices were identified and had clear efflux.  No bladder mucosal tumors or lesions were identified.  A 6 French open-ended ureteral catheter was then used to cannulate the right orifice and retrograde pyelography was performed.  No hydronephrosis or filling defects or stones were identified. A  0.035 guidewire was then passed up the right ureter under fluoroscopic guidance.  The short mini semirigid ureteroscope was then visually advanced up the ureter to the level of the UPJ.  No urinary calculi or ureteral tumors were identified.  Guidewire and ureteroscope were then removed.  10 cc of viscous Xylocaine was instilled within the urethra and bladder.  A B&O suppository was placed.  Procedure was then terminated and patient transferred to the recovery room in stable condition.

## 2017-10-03 NOTE — Discharge Instructions (Addendum)
AMBULATORY SURGERY  DISCHARGE INSTRUCTIONS   1) The drugs that you were given will stay in your system until tomorrow so for the next 24 hours you should not:  A) Drive an automobile B) Make any legal decisions C) Drink any alcoholic beverage   2) You may resume regular meals tomorrow.  Today it is better to start with liquids and gradually work up to solid foods.  You may eat anything you prefer, but it is better to start with liquids, then soup and crackers, and gradually work up to solid foods.   3) Please notify your doctor immediately if you have any unusual bleeding, trouble breathing, redness and pain at the surgery site, drainage, fever, or pain not relieved by medication. 4)   5) Your post-operative visit with Dr.                                     is: Date:                        Time:    Please call to schedule your post-operative visit.  6) Additional Instructions:      Dietary Guidelines to Help Prevent Kidney Stones Kidney stones are deposits of minerals and salts that form inside your kidneys. Your risk of developing kidney stones may be greater depending on your diet, your lifestyle, the medicines you take, and whether you have certain medical conditions. Most people can reduce their chances of developing kidney stones by following the instructions below. Depending on your overall health and the type of kidney stones you tend to develop, your dietitian may give you more specific instructions. What are tips for following this plan? Reading food labels  Choose foods with "no salt added" or "low-salt" labels. Limit your sodium intake to less than 1500 mg per day.  Choose foods with calcium for each meal and snack. Try to eat about 300 mg of calcium at each meal. Foods that contain 200-500 mg of calcium per serving include: ? 8 oz (237 ml) of milk, fortified nondairy milk, and fortified fruit juice. ? 8 oz (237 ml) of kefir, yogurt, and soy yogurt. ? 4 oz (118  ml) of tofu. ? 1 oz of cheese. ? 1 cup (300 g) of dried figs. ? 1 cup (91 g) of cooked broccoli. ? 1-3 oz can of sardines or mackerel.  Most people need 1000 to 1500 mg of calcium each day. Talk to your dietitian about how much calcium is recommended for you. Shopping  Buy plenty of fresh fruits and vegetables. Most people do not need to avoid fruits and vegetables, even if they contain nutrients that may contribute to kidney stones.  When shopping for convenience foods, choose: ? Whole pieces of fruit. ? Premade salads with dressing on the side. ? Low-fat fruit and yogurt smoothies.  Avoid buying frozen meals or prepared deli foods.  Look for foods with live cultures, such as yogurt and kefir. Cooking  Do not add salt to food when cooking. Place a salt shaker on the table and allow each person to add his or her own salt to taste.  Use vegetable protein, such as beans, textured vegetable protein (TVP), or tofu instead of meat in pasta, casseroles, and soups. Meal planning  Eat less salt, if told by your dietitian. To do this: ? Avoid eating processed or premade food. ? Avoid  eating fast food.  Eat less animal protein, including cheese, meat, poultry, or fish, if told by your dietitian. To do this: ? Limit the number of times you have meat, poultry, fish, or cheese each week. Eat a diet free of meat at least 2 days a week. ? Eat only one serving each day of meat, poultry, fish, or seafood. ? When you prepare animal protein, cut pieces into small portion sizes. For most meat and fish, one serving is about the size of one deck of cards.  Eat at least 5 servings of fresh fruits and vegetables each day. To do this: ? Keep fruits and vegetables on hand for snacks. ? Eat 1 piece of fruit or a handful of berries with breakfast. ? Have a salad and fruit at lunch. ? Have two kinds of vegetables at dinner.  Limit foods that are high in a substance called oxalate. These  include: ? Spinach. ? Rhubarb. ? Beets. ? Potato chips and french fries. ? Nuts.  If you regularly take a diuretic medicine, make sure to eat at least 1-2 fruits or vegetables high in potassium each day. These include: ? Avocado. ? Banana. ? Orange, prune, carrot, or tomato juice. ? Baked potato. ? Cabbage. ? Beans and split peas. General instructions  Drink enough fluid to keep your urine clear or pale yellow. This is the most important thing you can do.  Talk to your health care provider and dietitian about taking daily supplements. Depending on your health and the cause of your kidney stones, you may be advised: ? Not to take supplements with vitamin C. ? To take a calcium supplement. ? To take a daily probiotic supplement. ? To take other supplements such as magnesium, fish oil, or vitamin B6.  Take all medicines and supplements as told by your health care provider.  Limit alcohol intake to no more than 1 drink a day for nonpregnant women and 2 drinks a day for men. One drink equals 12 oz of beer, 5 oz of wine, or 1 oz of hard liquor.  Lose weight if told by your health care provider. Work with your dietitian to find strategies and an eating plan that works best for you. What foods are not recommended? Limit your intake of the following foods, or as told by your dietitian. Talk to your dietitian about specific foods you should avoid based on the type of kidney stones and your overall health. Grains Breads. Bagels. Rolls. Baked goods. Salted crackers. Cereal. Pasta. Vegetables Spinach. Rhubarb. Beets. Canned vegetables. Rosita FirePickles. Olives. Meats and other protein foods Nuts. Nut butters. Large portions of meat, poultry, or fish. Salted or cured meats. Deli meats. Hot dogs. Sausages. Dairy Cheese. Beverages Regular soft drinks. Regular vegetable juice. Seasonings and other foods Seasoning blends with salt. Salad dressings. Canned soups. Soy sauce. Ketchup. Barbecue sauce.  Canned pasta sauce. Casseroles. Pizza. Lasagna. Frozen meals. Potato chips. [place] fries. Summary  You can reduce your risk of kidney stones by making changes to your diet.  The most important thing you can do is drink enough fluid. You should drink enough fluid to keep your urine clear or pale yellow.  Ask your health care provider or dietitian how much protein from animal sources you should eat each day, and also how much salt and calcium you should have each day. This information is not intended to replace advice given to you by your health care provider. Make sure you discuss any questions you have with your health  care provider. Document Released: 04/30/2010 Document Revised: 12/15/2015 Document Reviewed: 12/15/2015 Elsevier Interactive Patient Education  2018 [employer]. Low-Purine Diet Purines are compounds that affect the level of uric acid in your body. A low-purine diet is a diet that is low in purines. Eating a low-purine diet can prevent the level of uric acid in your body from getting too high and causing gout or kidney stones or both. What do I need to know about this diet?  Choose low-purine foods. Examples of low-purine foods are listed in the next section.  Drink plenty of fluids, especially water. Fluids can help remove uric acid from your body. Try to drink 8-16 cups (1.9-3.8 L) a day.  Limit foods high in fat, especially saturated fat, as fat makes it harder for the body to get rid of uric acid. Foods high in saturated fat include pizza, cheese, ice cream, whole milk, fried foods, and gravies. Choose foods that are lower in fat and lean sources of protein. Use olive oil when cooking as it contains healthy fats that are not high in saturated fat.  Limit alcohol. Alcohol interferes with the elimination of uric acid from your body. If you are having a gout attack, avoid all alcohol.  Keep in mind that different peoples bodies react differently to different foods. You will  probably learn over time which foods do or do not affect you. If you discover that a food tends to cause your gout to flare up, avoid eating that food. You can more freely enjoy foods that do not cause problems. If you have any questions about a food item, talk to your dietitian or health care provider. Which foods are low, moderate, and high in purines? The following is a list of foods that are low, moderate, and high in purines. You can eat any amount of the foods that are low in purines. You may be able to have small amounts of foods that are moderate in purines. Ask your health care provider how much of a food moderate in purines you can have. Avoid foods high in purines. Grains  Foods low in purines: Enriched white bread, pasta, rice, cake, cornbread, popcorn.  Foods moderate in purines: Whole-grain breads and cereals, wheat germ, bran, oatmeal. Uncooked oatmeal. Dry wheat bran or wheat germ.  Foods high in purines: Pancakes, [place] toast, biscuits, muffins. Vegetables  Foods low in purines: All vegetables, except those that are moderate in purines.  Foods moderate in purines: Asparagus, cauliflower, spinach, mushrooms, green peas. Fruits  All fruits are low in purines. Meats and other Protein Foods  Foods low in purines: Eggs, nuts, peanut butter.  Foods moderate in purines: 80-90% lean beef, lamb, veal, pork, poultry, fish, eggs, peanut butter, nuts. Crab, lobster, oysters, and shrimp. Cooked dried beans, peas, and lentils.  Foods high in purines: Anchovies, sardines, herring, mussels, tuna, codfish, scallops, trout, and haddock. Tomasa Blase. Organ meats (such as liver or kidney). Tripe. Game meat. Goose. Sweetbreads. Dairy  All dairy foods are low in purines. Low-fat and fat-free dairy products are best because they are low in saturated fat. Beverages  Drinks low in purines: Water, carbonated beverages, tea, coffee, cocoa.  Drinks moderate in purines: Soft drinks and other drinks  sweetened with high-fructose corn syrup. Juices. To find whether a food or drink is sweetened with high-fructose corn syrup, look at the ingredients list.  Drinks high in purines: Alcoholic beverages (such as beer). Condiments  Foods low in purines: Salt, herbs, olives, pickles, relishes, vinegar.  Foods moderate in purines: Butter, margarine, oils, mayonnaise. Fats and Oils  Foods low in purines: All types, except gravies and sauces made with meat.  Foods high in purines: Gravies and sauces made with meat. Other Foods  Foods low in purines: Sugars, sweets, gelatin. Cake. Soups made without meat.  Foods moderate in purines: Meat-based or fish-based soups, broths, or bouillons. Foods and drinks sweetened with high-fructose corn syrup.  Foods high in purines: High-fat desserts (such as ice cream, cookies, cakes, pies, doughnuts, and chocolate). Contact your dietitian for more information on foods that are not listed here. This information is not intended to replace advice given to you by your health care provider. Make sure you discuss any questions you have with your health care provider. Document Released: 04/30/2010 Document Revised: 06/11/2015 Document Reviewed: 12/10/2012 Elsevier Interactive Patient Education  2017 [employer].

## 2017-10-03 NOTE — Anesthesia Preprocedure Evaluation (Signed)
Anesthesia Evaluation  Patient identified by MRN, date of birth, ID band Patient awake    Reviewed: Allergy & Precautions, NPO status   History of Anesthesia Complications Negative for: history of anesthetic complications  Airway Mallampati: III       Dental   Pulmonary neg sleep apnea, neg COPD,           Cardiovascular (-) hypertension(-) Past MI and (-) CHF (-) dysrhythmias (-) Valvular Problems/Murmurs     Neuro/Psych neg Seizures    GI/Hepatic Neg liver ROS, neg GERD  ,  Endo/Other  diabetes, Type 2, Oral Hypoglycemic AgentsMorbid obesity  Renal/GU Renal disease (stones)     Musculoskeletal   Abdominal   Peds  Hematology   Anesthesia Other Findings   Reproductive/Obstetrics                             Anesthesia Physical Anesthesia Plan  ASA: III  Anesthesia Plan: General   Post-op Pain Management:    Induction: Intravenous  PONV Risk Score and Plan: 2 and Dexamethasone and Ondansetron  Airway Management Planned: LMA and Oral ETT  Additional Equipment:   Intra-op Plan:   Post-operative Plan:   Informed Consent: I have reviewed the patients History and Physical, chart, labs and discussed the procedure including the risks, benefits and alternatives for the proposed anesthesia with the patient or authorized representative who has indicated his/her understanding and acceptance.     Plan Discussed with:   Anesthesia Plan Comments:         Anesthesia Quick Evaluation

## 2017-10-03 NOTE — H&P (Signed)
Date of Initial H&P: 09/28/17  History reviewed, patient examined, no change in status, stable for surgery. 

## 2017-10-03 NOTE — Anesthesia Postprocedure Evaluation (Signed)
Anesthesia Post Note  Patient: Adam BergFranklin W Edwards Jr.  Procedure(s) Performed: CYSTOSCOPY WITH RETROGRADE PYELOGRAM (Right Ureter) URETEROSCOPY (Right Ureter)  Patient location during evaluation: PACU Anesthesia Type: General Level of consciousness: awake and alert Pain management: pain level controlled Vital Signs Assessment: post-procedure vital signs reviewed and stable Respiratory status: spontaneous breathing and respiratory function stable Cardiovascular status: stable Anesthetic complications: no     Last Vitals:  Vitals:   10/03/17 1427 10/03/17 1435  BP: (!) 92/46 (!) 114/56  Pulse:  87  Resp:  20  Temp: (!) 36.3 C   SpO2:  100%    Last Pain:  Vitals:   10/03/17 1435  TempSrc:   PainSc: 0-No pain                 Danne Vasek K

## 2017-10-03 NOTE — Anesthesia Post-op Follow-up Note (Signed)
Anesthesia QCDR form completed.        

## 2017-10-03 NOTE — Anesthesia Procedure Notes (Signed)
Procedure Name: Intubation Date/Time: 10/03/2017 1:41 PM Performed by: Gunnar Fusi, MD Pre-anesthesia Checklist: Patient identified, Emergency Drugs available, Suction available, Patient being monitored and Timeout performed Patient Re-evaluated:Patient Re-evaluated prior to induction Oxygen Delivery Method: Circle system utilized Preoxygenation: Pre-oxygenation with 100% oxygen Induction Type: IV induction Ventilation: Two handed mask ventilation required and Oral airway inserted - appropriate to patient size Laryngoscope Size: Mac and 4 Grade View: Grade I Tube type: Oral Tube size: 7.5 mm Number of attempts: 1 Airway Equipment and Method: Stylet Placement Confirmation: ETT inserted through vocal cords under direct vision,  positive ETCO2 and breath sounds checked- equal and bilateral Secured at: 23 cm Tube secured with: Tape Dental Injury: Teeth and Oropharynx as per pre-operative assessment

## 2017-10-03 NOTE — Transfer of Care (Signed)
Immediate Anesthesia Transfer of Care Note  Patient: Adam BergFranklin W Domingos Jr.  Procedure(s) Performed: CYSTOSCOPY WITH RETROGRADE PYELOGRAM (Right Ureter) URETEROSCOPY (Right Ureter)  Patient Location: PACU  Anesthesia Type:General  Level of Consciousness: sedated  Airway & Oxygen Therapy: Patient Spontanous Breathing and Patient connected to face mask oxygen  Post-op Assessment: Report given to RN and Post -op Vital signs reviewed and stable  Post vital signs: Reviewed and stable  Last Vitals:  Vitals Value Taken Time  BP 92/46 10/03/2017  2:20 PM  Temp    Pulse 92 10/03/2017  2:22 PM  Resp 18 10/03/2017  2:22 PM  SpO2 100 % 10/03/2017  2:22 PM  Vitals shown include unvalidated device data.  Last Pain:  Vitals:   10/03/17 1209  TempSrc: Oral  PainSc: 7       Patients Stated Pain Goal: 3 (10/03/17 1209)  Complications: No apparent anesthesia complications

## 2017-10-04 ENCOUNTER — Encounter: Payer: Self-pay | Admitting: Urology

## 2017-11-29 ENCOUNTER — Other Ambulatory Visit: Payer: Self-pay | Admitting: Family Medicine

## 2017-11-29 ENCOUNTER — Ambulatory Visit
Admission: RE | Admit: 2017-11-29 | Discharge: 2017-11-29 | Disposition: A | Discharge status: Home / Self Care | Payer: BLUE CROSS/BLUE SHIELD | Source: Ambulatory Visit | Attending: Family Medicine | Admitting: Family Medicine

## 2017-11-29 DIAGNOSIS — G8929 Other chronic pain: Secondary | ICD-10-CM

## 2017-11-29 DIAGNOSIS — M79642 Pain in left hand: Principal | ICD-10-CM

## 2017-12-07 ENCOUNTER — Other Ambulatory Visit: Payer: Self-pay

## 2017-12-07 ENCOUNTER — Ambulatory Visit: Payer: BLUE CROSS/BLUE SHIELD | Attending: Nurse Practitioner | Admitting: Nurse Practitioner

## 2017-12-07 ENCOUNTER — Encounter: Payer: Self-pay | Admitting: Nurse Practitioner

## 2017-12-07 VITALS — BP 142/81 | HR 98 | Temp 98.3°F | Ht 65.0 in | Wt 252.0 lb

## 2017-12-07 DIAGNOSIS — G894 Chronic pain syndrome: Secondary | ICD-10-CM | POA: Insufficient documentation

## 2017-12-07 DIAGNOSIS — M792 Neuralgia and neuritis, unspecified: Secondary | ICD-10-CM

## 2017-12-07 DIAGNOSIS — Z809 Family history of malignant neoplasm, unspecified: Secondary | ICD-10-CM | POA: Diagnosis not present

## 2017-12-07 DIAGNOSIS — M961 Postlaminectomy syndrome, not elsewhere classified: Secondary | ICD-10-CM | POA: Insufficient documentation

## 2017-12-07 DIAGNOSIS — M79604 Pain in right leg: Secondary | ICD-10-CM | POA: Diagnosis not present

## 2017-12-07 DIAGNOSIS — M47896 Other spondylosis, lumbar region: Secondary | ICD-10-CM | POA: Diagnosis not present

## 2017-12-07 DIAGNOSIS — Z79891 Long term (current) use of opiate analgesic: Secondary | ICD-10-CM | POA: Insufficient documentation

## 2017-12-07 DIAGNOSIS — Z833 Family history of diabetes mellitus: Secondary | ICD-10-CM | POA: Diagnosis not present

## 2017-12-07 DIAGNOSIS — Z87828 Personal history of other (healed) physical injury and trauma: Secondary | ICD-10-CM | POA: Insufficient documentation

## 2017-12-07 DIAGNOSIS — M25561 Pain in right knee: Secondary | ICD-10-CM | POA: Diagnosis not present

## 2017-12-07 DIAGNOSIS — Z794 Long term (current) use of insulin: Secondary | ICD-10-CM | POA: Diagnosis not present

## 2017-12-07 DIAGNOSIS — Z6841 Body Mass Index (BMI) 40.0 and over, adult: Secondary | ICD-10-CM | POA: Diagnosis not present

## 2017-12-07 DIAGNOSIS — Z79899 Other long term (current) drug therapy: Secondary | ICD-10-CM | POA: Insufficient documentation

## 2017-12-07 DIAGNOSIS — M549 Dorsalgia, unspecified: Secondary | ICD-10-CM | POA: Diagnosis present

## 2017-12-07 DIAGNOSIS — M47816 Spondylosis without myelopathy or radiculopathy, lumbar region: Secondary | ICD-10-CM | POA: Diagnosis not present

## 2017-12-07 DIAGNOSIS — M48061 Spinal stenosis, lumbar region without neurogenic claudication: Secondary | ICD-10-CM | POA: Insufficient documentation

## 2017-12-07 DIAGNOSIS — G8929 Other chronic pain: Secondary | ICD-10-CM

## 2017-12-07 DIAGNOSIS — Z836 Family history of other diseases of the respiratory system: Secondary | ICD-10-CM | POA: Diagnosis not present

## 2017-12-07 DIAGNOSIS — E538 Deficiency of other specified B group vitamins: Secondary | ICD-10-CM | POA: Insufficient documentation

## 2017-12-07 DIAGNOSIS — E1165 Type 2 diabetes mellitus with hyperglycemia: Secondary | ICD-10-CM | POA: Diagnosis not present

## 2017-12-07 MED ORDER — OXYCODONE HCL 5 MG PO TABS: 5.0000 mg | ORAL_TABLET | Freq: Four times a day (QID) | ORAL | 0 refills | Status: DC | PRN

## 2017-12-07 MED ORDER — GABAPENTIN 600 MG PO TABS: 600.0000 mg | ORAL_TABLET | Freq: Four times a day (QID) | ORAL | 0 refills | Status: DC

## 2017-12-07 NOTE — Patient Instructions (Addendum)
____________________________________________________________________________________________  Medication Rules  Purpose: To inform patients, and their family members, of our rules and regulations.  Applies to: All patients receiving prescriptions (written or electronic).  Pharmacy of record: Pharmacy where electronic prescriptions will be sent. If written prescriptions are taken to a different pharmacy, please inform the nursing staff. The pharmacy listed in the electronic medical record should be the one where you would like electronic prescriptions to be sent.  Electronic prescriptions: In compliance with the [place] Strengthen Opioid Misuse Prevention (STOP) Act of 2017 (Session Law 2017-74/H243), effective January 17, 2018, all controlled substances must be electronically prescribed. Calling prescriptions to the pharmacy will cease to exist.  Prescription refills: Only during scheduled appointments. Applies to all prescriptions.  NOTE: The following applies primarily to controlled substances (Opioid* Pain Medications).   Patient's responsibilities: 1. Pain Pills: Bring all pain pills to every appointment (except for procedure appointments). 2. Pill Bottles: Bring pills in original pharmacy bottle. Always bring the newest bottle. Bring bottle, even if empty. 3. Medication refills: You are responsible for knowing and keeping track of what medications you take and those you need refilled. The day before your appointment: write a list of all prescriptions that need to be refilled. The day of the appointment: give the list to the admitting nurse. Prescriptions will be written only during appointments. If you forget a medication: it will not be "Called in", "Faxed", or "electronically sent". You will need to get another appointment to get these prescribed. No early refills. Do not call asking to have your prescription filled early. 4. Prescription Accuracy: You are responsible for  carefully inspecting your prescriptions before leaving our office. Have the discharge nurse carefully go over each prescription with you, before taking them home. Make sure that your name is accurately spelled, that your address is correct. Check the name and dose of your medication to make sure it is accurate. Check the number of pills, and the written instructions to make sure they are clear and accurate. Make sure that you are given enough medication to last until your next medication refill appointment. 5. Taking Medication: Take medication as prescribed. When it comes to controlled substances, taking less pills or less frequently than prescribed is permitted and encouraged. Never take more pills than instructed. Never take medication more frequently than prescribed.  6. Inform other Doctors: Always inform, all of your healthcare providers, of all the medications you take. 7. Pain Medication from other Providers: You are not allowed to accept any additional pain medication from any other Doctor or Healthcare provider. There are two exceptions to this rule. (see below) In the event that you require additional pain medication, you are responsible for notifying us, as stated below. 8. Medication Agreement: You are responsible for carefully reading and following our Medication Agreement. This must be signed before receiving any prescriptions from our practice. Safely store a copy of your signed Agreement. Violations to the Agreement will result in no further prescriptions. (Additional copies of our Medication Agreement are available upon request.) 9. Laws, Rules, & Regulations: All patients are expected to follow all Federal and State Laws, Statutes, Rules, & Regulations. Ignorance of the Laws does not constitute a valid excuse. The use of any illegal substances is prohibited. 10. Adopted CDC guidelines & recommendations: Target dosing levels will be at or below 60 MME/day. Use of benzodiazepines** is not  recommended.  Exceptions: There are only two exceptions to the rule of not receiving pain medications from other Healthcare Providers. 1.   Exception #1 (Emergencies): In the event of an emergency (i.e.: accident requiring emergency care), you are allowed to receive additional pain medication. However, you are responsible for: As soon as you are able, call our office [phone], at any time of the day or night, and leave a message stating your name, the date and nature of the emergency, and the name and dose of the medication prescribed. In the event that your call is answered by a member of our staff, make sure to document and save the date, time, and the name of the person that took your information.  2. Exception #2 (Planned Surgery): In the event that you are scheduled by another doctor or dentist to have any type of surgery or procedure, you are allowed (for a period no longer than 30 days), to receive additional pain medication, for the acute post-op pain. However, in this case, you are responsible for picking up a copy of our "Post-op Pain Management for Surgeons" handout, and giving it to your surgeon or dentist. This document is available at our office, and does not require an appointment to obtain it. Simply go to our office during business hours (Monday-Thursday from 8:00 AM to 4:00 PM) (Friday 8:00 AM to 12:00 Noon) or if you have a scheduled appointment with us, prior to your surgery, and ask for it by name. In addition, you will need to provide us with your name, name of your surgeon, type of surgery, and date of procedure or surgery.  *Opioid medications include: morphine, codeine, oxycodone, oxymorphone, hydrocodone, hydromorphone, meperidine, tramadol, tapentadol, buprenorphine, fentanyl, methadone. **Benzodiazepine medications include: diazepam (Valium), alprazolam (Xanax), clonazepam (Klonopine), lorazepam (Ativan), clorazepate (Tranxene), chlordiazepoxide (Librium), estazolam (Prosom),  oxazepam (Serax), temazepam (Restoril), triazolam (Halcion) (Last updated: 03/16/2017) ____________________________________________________________________________________________   BMI Assessment: Estimated body mass index is 41.93 kg/m as calculated from the following:   Height as of this encounter: 5\' 5"  (1.651 m).   Weight as of this encounter: 252 lb (114.3 kg).  BMI interpretation table: BMI level Category Range association with higher incidence of chronic pain  <18 kg/m2 Underweight   18.5-24.9 kg/m2 Ideal body weight   25-29.9 kg/m2 Overweight Increased incidence by 20%  30-34.9 kg/m2 Obese (Class I) Increased incidence by 68%  35-39.9 kg/m2 Severe obesity (Class II) Increased incidence by 136%  >40 kg/m2 Extreme obesity (Class III) Increased incidence by 254%   Patient's current BMI Ideal Body weight  Body mass index is 41.93 kg/m. Ideal body weight: 61.5 kg (135 lb 9.3 oz) Adjusted ideal body weight: 82.6 kg (182 lb 2.4 oz)   BMI Readings from Last 4 Encounters:  12/07/17 41.93 kg/m  10/03/17 42.38 kg/m  10/02/17 40.35 kg/m  09/05/17 40.35 kg/m   Wt Readings from Last 4 Encounters:  12/07/17 252 lb (114.3 kg)  10/03/17 262 lb 9.6 oz (119.1 kg)  09/05/17 250 lb (113.4 kg)  06/05/17 245 lb (111.1 kg)

## 2017-12-07 NOTE — Progress Notes (Signed)
Patient's Name: Adam Petty.  MRN: [id number]  Referring Provider: No ref. provider found  DOB: [birth year]  PCP: Zeb Comfort, MD  DOS: 12/07/2017  Note by: Vevelyn Francois NP  Service setting: Ambulatory outpatient  Specialty: Interventional Pain Management  Location: [facility] (AMB) Pain Management Facility    Patient type: Established    Primary Reason(s) for Visit: Encounter for prescription drug management. (Level of risk: moderate)  CC: Back Pain  HPI  Adam Petty is a 57 y.o. year old, male patient, who comes today for a medication management evaluation. 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); Discogenic syndrome, lumbar (WC injury); Osteoarthritis of spine with radiculopathy, lumbar region (WC injury); Lumbar facet syndrome (WC injury) (Bilateral) (R>L); Lumbosacral radiculopathy (WC injury); Pain in right knee (WC injury); Obesity, Class III, BMI 40-49.9 (morbid obesity) ([place]); Uncomplicated opioid dependence ([place]); Therapeutic opioid-induced constipation (OIC); Dyslipidemia; Microalbuminuria; Adiposity; Lumbar canal stenosis; Type 2 diabetes mellitus with hyperglycemia, with long-term current use of insulin ([place]); Long term prescription opiate use; Encounter for chronic pain management; Neurogenic pain; Neuropathic pain; Musculoskeletal pain; Lumbar spondylosis; Chronic knee pain (Secondary source of pain) (Right); Osteoarthritis of knee (Location of Secondary source of pain) (Right); Chronic shoulder pain [facility] source of pain) (Right); Osteoarthritis of shoulder (Location of Tertiary source of pain) (Right); Chronic lower extremity pain (Right); Recurrent nephrolithiasis; Acute medial meniscal tear; Chronic pain  syndrome (WC injury); Acute low back pain without sciatica; Osteoarthritis of glenohumeral joint (Right); Chronic sacroiliac joint pain (Bilateral) (R>L); Hyperlipidemia due to type 2 diabetes mellitus ([place]); and B12 deficiency on their problem list. His primarily concern today is the Back Pain  Pain Assessment: Location: Lower Back Radiating: pain radiaties down leg to foot Onset: More than a month ago Duration: Chronic pain Quality: Sharp, Shooting, Aching, Burning, Throbbing, Discomfort Severity: 4 /10 (subjective, self-reported pain score)  Note: Reported level is compatible with observation.                          Effect on ADL: pain slow me down Timing: Constant Modifying factors: medications, heating pad BP: (!) 142/81  HR: 98  Adam Petty was last scheduled for an appointment on 09/05/2017 for medication management. During today's appointment we reviewed Adam Petty chronic pain status, as well as his outpatient medication regimen. He admits that he has bilateral back sided pain in his legs that goes into the feet. He has more tingling and shooting type pain. He denies any numbness. He admits that it varies.  He feels like it is getting worse. He has to use Aleve and APAP during the day.   The patient  reports that he does not use drugs. His body mass index is 41.93 kg/m.  Further details on both, my assessment(s), as well as the proposed treatment plan, please see below.  Controlled Substance Pharmacotherapy Assessment REMS (Risk Evaluation and Mitigation Strategy)  Analgesic: Oxycodone IR 5 mg 1 tablet by mouth every 6 hours (20 mg/day)  MME/day: 30 mg/day [name], RN  12/07/2017  8:55 AM  Sign at close encounter Nursing Pain Medication Assessment:  Safety precautions to be maintained throughout the outpatient stay will include: orient to surroundings, keep bed in low position, maintain  call bell within reach at all times, provide assistance with transfer out of  bed and ambulation.  Medication Inspection Compliance: Pill count conducted under aseptic conditions, in front of the patient. Neither the pills nor the bottle was removed from the patient's sight at any time. Once count was completed pills were immediately returned to the patient in their original bottle.  Medication: Oxycodone IR Pill/Patch Count: 87 of 120 pills remain Pill/Patch Appearance: Markings consistent with prescribed medication Bottle Appearance: Standard pharmacy container. Clearly labeled. Filled Date: 32 / 10 / 2019 Last Medication intake:  Today   Pharmacokinetics: Liberation and absorption (onset of action): WNL Distribution (time to peak effect): WNL Metabolism and excretion (duration of action): WNL         Pharmacodynamics: Desired effects: Analgesia: Adam Petty reports >50% benefit. Functional ability: Patient reports that medication allows him to accomplish basic ADLs Clinically meaningful improvement in function (CMIF): Sustained CMIF goals met Perceived effectiveness: Described as relatively effective, allowing for increase in activities of daily living (ADL) Undesirable effects: Side-effects or Adverse reactions: None reported Monitoring: [place] PMP: Online review of the past 31-monthperiod conducted. Compliant with practice rules and regulations Last UDS on record: Summary  Date Value Ref Range Status  03/06/2017 FINAL  Final    Comment:    ==================================================================== TOXASSURE SELECT 13 (MW) ==================================================================== Test                             Result       Flag       Units Drug Present and Declared for Prescription Verification   Oxycodone                      1386         EXPECTED   ng/mg creat   Oxymorphone                    1011         EXPECTED   ng/mg creat   Noroxycodone                   1650         EXPECTED   ng/mg creat   Noroxymorphone                  283          EXPECTED   ng/mg creat    Sources of oxycodone are scheduled prescription medications.    Oxymorphone, noroxycodone, and noroxymorphone are expected    metabolites of oxycodone. Oxymorphone is also available as a    scheduled prescription medication. ==================================================================== Test                      Result    Flag   Units      Ref Range   Creatinine              169              mg/dL      >=20 ==================================================================== Declared Medications:  The flagging and interpretation on this report are based on the  following declared medications.  Unexpected results may arise from  inaccuracies in the declared medications.  **Note: The testing scope of this panel includes these medications:  Oxycodone  **Note: The testing scope of this panel does not include following  reported medications:  Cyanocobalamin  Gabapentin  Insulin  Ketoconazole  Liraglutide  Metformin  Potassium  Rosuvastatin ==================================================================== For clinical consultation, please call [phone]. ====================================================================    UDS interpretation: Compliant          Medication Assessment Form: Reviewed. Patient indicates being compliant with therapy Treatment compliance: Compliant Risk Assessment Profile: Aberrant behavior: See prior evaluations. None observed or detected today Comorbid factors increasing risk of overdose: See prior notes. No additional risks detected today Opioid risk tool (ORT) (Total Score): 0 Personal History of Substance Abuse (SUD-Substance use disorder):  Alcohol: Negative  Illegal Drugs: Negative  Rx Drugs: Negative  ORT Risk Level calculation: Low Risk Risk of substance use disorder (SUD): Low Opioid Risk Tool - 12/07/17 0905      Family History of Substance Abuse   Alcohol  Negative    Illegal Drugs   Negative    Rx Drugs  Negative      Personal History of Substance Abuse   Alcohol  Negative    Illegal Drugs  Negative    Rx Drugs  Negative      Age   Age between 10-45 years   No      History of Preadolescent Sexual Abuse   History of Preadolescent Sexual Abuse  Negative or Male      Psychological Disease   Psychological Disease  Negative    Depression  Negative      Total Score   Opioid Risk Tool Scoring  0    Opioid Risk Interpretation  Low Risk      ORT Scoring interpretation table:  Score <3 = Low Risk for SUD  Score between 4-7 = Moderate Risk for SUD  Score >8 = High Risk for Opioid Abuse   Risk Mitigation Strategies:  Patient Counseling: Covered Patient-Prescriber Agreement (PPA): Present and active  Notification to other healthcare providers: Done  Pharmacologic Plan: No change in therapy, at this time.             Laboratory Chemistry  Inflammation Markers (CRP: Acute Phase) (ESR: Chronic Phase) Lab Results  Component Value Date   CRP 0.6 03/12/2015   ESRSEDRATE 9 03/12/2015                         Rheumatology Markers No results found for: RF, ANA, LABURIC, URICUR, LYMEIGGIGMAB, LYMEABIGMQN, HLAB27                      Renal Function Markers Lab Results  Component Value Date   BUN 12 10/02/2017   CREATININE 1.06 10/02/2017   GFRAA >60 10/02/2017   GFRNONAA >60 10/02/2017                             Hepatic Function Markers Lab Results  Component Value Date   AST 41 03/12/2015   ALT 55 03/12/2015   ALBUMIN 4.2 03/12/2015   ALKPHOS 89 03/12/2015                        Electrolytes Lab Results  Component Value Date   NA 137 10/02/2017   K 4.2 10/02/2017   CL 103 10/02/2017   CALCIUM 8.8 (L) 10/02/2017   MG 1.8 03/12/2015                        Neuropathy Markers Lab Results  Component Value Date   HGBA1C 9.2 (  H) 08/31/2012                        CNS Tests No results found for: COLORCSF, APPEARCSF, RBCCOUNTCSF, WBCCSF,  POLYSCSF, LYMPHSCSF, EOSCSF, PROTEINCSF, GLUCCSF, JCVIRUS, CSFOLI, IGGCSF                      Bone Pathology Markers No results found for: VD25OH, MW102VO5DGU, YQ0347QQ5, [id number], 25OHVITD1, 25OHVITD2, 25OHVITD3, TESTOFREE, TESTOSTERONE                       Coagulation Parameters Lab Results  Component Value Date   PLT 176 03/13/2015                        Cardiovascular Markers Lab Results  Component Value Date   CKTOTAL 184 08/31/2012   CKMB 1.1 08/31/2012   TROPONINI < 0.02 08/31/2012   HGB 15.3 03/13/2015   HCT 46.4 03/13/2015                         CA Markers No results found for: CEA, CA125, LABCA2                      Note: Lab results reviewed.  Recent Diagnostic Imaging Results  DG Hand 2 View Left CLINICAL DATA:  Hand pain for several months, no known injury, initial encounter  EXAM: LEFT HAND - 2 VIEW  COMPARISON:  None.  FINDINGS: There is no evidence of fracture or dislocation. There is no evidence of arthropathy or other focal bone abnormality. Soft tissues are unremarkable.  IMPRESSION: No acute abnormality noted.  Electronically Signed   By: Inez Catalina M.D.   On: 11/29/2017 11:35  Complexity Note: Imaging results reviewed. Results shared with Adam Petty, using Layman's terms.                         Meds   Current Outpatient Medications:  .  cyanocobalamin (,VITAMIN B-12,) 1000 MCG/ML injection, Inject into the muscle every 30 (thirty) days. , Disp: , Rfl:  .  [START ON 12/23/2017] gabapentin (NEURONTIN) 600 MG tablet, Take 1 tablet (600 mg total) by mouth every 6 (six) hours., Disp: 360 tablet, Rfl: 0 .  ibuprofen (ADVIL,MOTRIN) 800 MG tablet, Take 800 mg by mouth every 8 (eight) hours as needed., Disp: , Rfl:  .  insulin degludec (TRESIBA FLEXTOUCH) 100 UNIT/ML SOPN FlexTouch Pen, Inject 40 Units into the skin daily. Has been taking 30, Disp: , Rfl:  .  ketoconazole (NIZORAL) 2 % shampoo, APPLY TO AFFECTED AREA EVERY DAY AS NEEDED,  Disp: , Rfl: 4 .  liraglutide (VICTOZA) 18 MG/3ML SOPN, INJECT 0.3 MLS (1.8 MG TOTAL) SUBCUTANEOUSLY ONCE DAILY., Disp: , Rfl:  .  metFORMIN (GLUCOPHAGE) 1000 MG tablet, TAKE 1 TABLET (1,000 MG TOTAL) BY MOUTH 2 (TWO) TIMES DAILY WITH MEALS., Disp: , Rfl: 1 .  naproxen sodium (ALEVE) 220 MG tablet, Take 440 mg by mouth., Disp: , Rfl:  .  NOVOLOG FLEXPEN 100 UNIT/ML FlexPen, Inject 0-10 Units into the skin daily as needed for high blood sugar (sliding scale). Reported on 02/24/2015, Disp: , Rfl: 0 .  [START ON 02/21/2018] oxyCODONE (OXY IR/ROXICODONE) 5 MG immediate release tablet, Take 1 tablet (5 mg total) by mouth every 6 (six) hours as needed for severe pain., Disp: 120 tablet, Rfl: 0 .  potassium citrate (UROCIT-K) 10 MEQ (1080 MG) SR tablet, Take 10 mEq by mouth 2 (two) times daily., Disp: , Rfl:  .  ramipril (ALTACE) 5 MG capsule, Take 5 mg by mouth daily., Disp: , Rfl:  .  rosuvastatin (CRESTOR) 5 MG tablet, Take 5 mg by mouth 3 (three) times a week. Monday Wednesday Friday, Disp: , Rfl:  .  [START ON 01/22/2018] oxyCODONE (OXY IR/ROXICODONE) 5 MG immediate release tablet, Take 1 tablet (5 mg total) by mouth every 6 (six) hours as needed for severe pain., Disp: 120 tablet, Rfl: 0 .  [START ON 12/23/2017] oxyCODONE (OXY IR/ROXICODONE) 5 MG immediate release tablet, Take 1 tablet (5 mg total) by mouth every 6 (six) hours as needed for severe pain., Disp: 120 tablet, Rfl: 0  ROS  Constitutional: Denies any fever or chills Gastrointestinal: No reported hemesis, hematochezia, vomiting, or acute GI distress Musculoskeletal: Denies any acute onset joint swelling, redness, loss of ROM, or weakness Neurological: No reported episodes of acute onset apraxia, aphasia, dysarthria, agnosia, amnesia, paralysis, loss of coordination, or loss of consciousness  Allergies  Adam Petty has No Known Allergies.  [place]  Drug: Adam Petty  reports that he does not use drugs. Alcohol:  reports that he drinks  alcohol. Tobacco:  reports that he has never smoked. He has never used smokeless tobacco. Medical:  has a past medical history of Arthritis, senescent (10/22/2014), Back pain, Chronic pain syndrome (10/22/2014), Diabetes mellitus without complication ([place]), History of kidney stones, IBS (irritable bowel syndrome), Lateral meniscus tear, Preventative health care, Sleep apnea, and Spinal stenosis. Surgical: Adam Petty  has a past surgical history that includes Tonsillectomy; Shoulder surgery; Palate surgery; Cervical fusion; Foot surgery; Lumbar laminectomy/decompression microdiscectomy (N/A, 02/06/2013); Knee arthroscopy (Right, 10/02/2013); Knee arthroscopy with medial menisectomy (Right, 03/25/2015); Knee bursectomy (Right, 03/25/2015); Cystoscopy w/ retrogrades (Right, 11/19/2015); Cystoscopy w/ retrogrades (Right, 10/03/2017); and Ureteroscopy (Right, 10/03/2017). Family: family history includes COPD in his mother; Cancer in his mother; Diabetes in his father; Heart disease in his father and mother.  Constitutional Exam  General appearance: Well nourished, well developed, and well hydrated. In no apparent acute distress Vitals:   12/07/17 0855  BP: (!) 142/81  Pulse: 98  Temp: 98.3 F (36.8 C)  SpO2: 98%  Weight: 252 lb (114.3 kg)  Height: '5\' 5"'  (1.651 m)  Psych/Mental status: Alert, oriented x 3 (person, place, & time)       Eyes: PERLA Respiratory: No evidence of acute respiratory distress  Lumbar Spine Area Exam  Skin & Axial Inspection: Well healed scar from previous spine surgery detected Alignment: Symmetrical Functional ROM: Unrestricted ROM       Stability: No instability detected Muscle Tone/Strength: Functionally intact. No obvious neuro-muscular anomalies detected. Sensory (Neurological): Unimpaired Palpation: No palpable anomalies       Provocative Tests: Hyperextension/rotation test: deferred today       Lumbar quadrant test (Kemp's test): deferred today       Lateral bending  test: deferred today       Patrick's Maneuver: deferred today                   FABER test: deferred today                   S-I anterior distraction/compression test: deferred today         S-I lateral compression test: deferred today         S-I Thigh-thrust test: deferred today  S-I Gaenslen's test: deferred today          Gait & Posture Assessment  Ambulation: Unassisted Gait: Relatively normal for age and body habitus Posture: WNL   Lower Extremity Exam    Side: Right lower extremity  Side: Left lower extremity  Stability: No instability observed          Stability: No instability observed          Skin & Extremity Inspection: Skin color, temperature, and hair growth are WNL. No peripheral edema or cyanosis. No masses, redness, swelling, asymmetry, or associated skin lesions. No contractures.  Skin & Extremity Inspection: Skin color, temperature, and hair growth are WNL. No peripheral edema or cyanosis. No masses, redness, swelling, asymmetry, or associated skin lesions. No contractures.  Functional ROM: Unrestricted ROM                  Functional ROM: Unrestricted ROM                  Muscle Tone/Strength: Functionally intact. No obvious neuro-muscular anomalies detected.  Muscle Tone/Strength: Functionally intact. No obvious neuro-muscular anomalies detected.  Palpation: No palpable anomalies  Palpation: No palpable anomalies   Assessment  Primary Diagnosis & Pertinent Problem List: The primary encounter diagnosis was Lumbar spondylosis. Diagnoses of Chronic lower extremity pain (Right), Neurogenic pain, Chronic pain syndrome, and Long term prescription opiate use were also pertinent to this visit.  Status Diagnosis  Controlled Controlled Controlled 1. Lumbar spondylosis   2. Chronic lower extremity pain (Right)   3. Neurogenic pain   4. Chronic pain syndrome   5. Long term prescription opiate use     Problems updated and reviewed during this visit: Problem  B12  Deficiency  Obesity, Class III, Bmi 40-49.9 (Morbid Obesity) ([facility])   Plan of Care  Pharmacotherapy (Medications Ordered): Meds ordered this encounter  Medications  . gabapentin (NEURONTIN) 600 MG tablet    Sig: Take 1 tablet (600 mg total) by mouth every 6 (six) hours.    Dispense:  360 tablet    Refill:  0    Do not place this medication, or any other prescription from our practice, on "Automatic Refill". Patient may have prescription filled one day early if pharmacy is closed on scheduled refill date.    Order Specific Question:   Supervising Provider    Answer:   Milinda Pointer [phone]  . oxyCODONE (OXY IR/ROXICODONE) 5 MG immediate release tablet    Sig: Take 1 tablet (5 mg total) by mouth every 6 (six) hours as needed for severe pain.    Dispense:  120 tablet    Refill:  0    Do not place this medication, or any other prescription from our practice, on "Automatic Refill". Patient may have prescription filled one day early if pharmacy is closed on scheduled refill date.    Order Specific Question:   Supervising Provider    Answer:   Milinda Pointer [phone]  . oxyCODONE (OXY IR/ROXICODONE) 5 MG immediate release tablet    Sig: Take 1 tablet (5 mg total) by mouth every 6 (six) hours as needed for severe pain.    Dispense:  120 tablet    Refill:  0    Do not place this medication, or any other prescription from our practice, on "Automatic Refill". Patient may have prescription filled one day early if pharmacy is closed on scheduled refill date.    Order Specific Question:   Supervising Provider    Answer:  [place] oxyCODONE (OXY IR/ROXICODONE) 5 MG immediate release tablet    Sig: Take 1 tablet (5 mg total) by mouth every 6 (six) hours as needed for severe pain.    Dispense:  120 tablet    Refill:  0    Do not place this medication, or any other prescription from our practice, on "Automatic Refill". Patient may have prescription filled one day early  if pharmacy is closed on scheduled refill date.    Order Specific Question:   Supervising Provider    Answer:   Milinda Pointer [[id number]]   New Prescriptions   No medications on file   Medications administered today: [name]. "Pilar Plate" had no medications administered during this visit. Lab-work, procedure(s), and/or referral(s): Orders Placed This Encounter  Procedures  . ToxASSURE Select 13 (MW), Urine   Imaging and/or referral(s): None  Interventional management options: Planned, scheduled, and/or pending: Not at this time.    Considering: Palliativebilateral lumbar facet block Palliativebilateral lumbar facet radiofrequencyablation (Right side done 04/13/2016) (100% relief) Diagnostic right knee genicular nerve block.  Possible right knee genicular nerve radiofrequencyablation   Palliative PRN treatment(s): Diagnostic Right Suprascapular nerve Block#2 Possible Right Suprascapular nerveRFA    Provider-requested follow-up: Return in about 3 months (around 03/09/2018) for MedMgmt.  Future Appointments  Date Time Provider [place]  02/28/2018  8:30 AM Vevelyn Francois, NP [facility] None   Primary Care Physician: Zeb Comfort, MD Location: [facility] Out[facility] Management Facility Note by: Vevelyn Francois NP Date: 12/07/2017; Time: 9:38 AM  Pain Score Disclaimer: We use the NRS-11 scale. This is a self-reported, subjective measurement of pain severity with only modest accuracy. It is used primarily to identify changes within a particular patient. It must be understood that outpatient pain scales are significantly less accurate that those used for research, where they can be applied under ideal controlled circumstances with minimal exposure to variables. In reality, the score is likely to be a combination of pain intensity and pain affect, where pain affect describes the degree of emotional arousal or changes in action readiness  caused by the sensory experience of pain. Factors such as social and work situation, setting, emotional state, anxiety levels, expectation, and prior pain experience may influence pain perception and show large inter-individual differences that may also be affected by time variables.  Patient instructions provided during this appointment: Patient Instructions   ____________________________________________________________________________________________  Medication Rules  Purpose: To inform patients, and their family members, of our rules and regulations.  Applies to: All patients receiving prescriptions (written or electronic).  Pharmacy of record: Pharmacy where electronic prescriptions will be sent. If written prescriptions are taken to a different pharmacy, please inform the nursing staff. The pharmacy listed in the electronic medical record should be the one where you would like electronic prescriptions to be sent.  Electronic prescriptions: In compliance with the [place] (STOP) Act of 2017 (Session Lanny Cramp [phone]), effective January 17, 2018, all controlled substances must be electronically prescribed. Calling prescriptions to the pharmacy will cease to exist.  Prescription refills: Only during scheduled appointments. Applies to all prescriptions.  NOTE: The following applies primarily to controlled substances (Opioid* Pain Medications).   Patient's responsibilities: 1. Pain Pills: Bring all pain pills to every appointment (except for procedure appointments). 2. Pill Bottles: Bring pills in original pharmacy bottle. Always bring the newest bottle. Bring bottle, even if empty. 3. Medication refills: You are responsible for knowing and keeping track  of what medications you take and those you need refilled. The day before your appointment: write a list of all prescriptions that need to be refilled. The day of the appointment: give the list to  the admitting nurse. Prescriptions will be written only during appointments. If you forget a medication: it will not be "Called in", "Faxed", or "electronically sent". You will need to get another appointment to get these prescribed. No early refills. Do not call asking to have your prescription filled early. 4. Prescription Accuracy: You are responsible for carefully inspecting your prescriptions before leaving our office. Have the discharge nurse carefully go over each prescription with you, before taking them home. Make sure that your name is accurately spelled, that your address is correct. Check the name and dose of your medication to make sure it is accurate. Check the number of pills, and the written instructions to make sure they are clear and accurate. Make sure that you are given enough medication to last until your next medication refill appointment. 5. Taking Medication: Take medication as prescribed. When it comes to controlled substances, taking less pills or less frequently than prescribed is permitted and encouraged. Never take more pills than instructed. Never take medication more frequently than prescribed.  6. Inform other Doctors: Always inform, all of your healthcare providers, of all the medications you take. 7. Pain Medication from other Providers: You are not allowed to accept any additional pain medication from any other Doctor or Healthcare provider. There are two exceptions to this rule. (see below) In the event that you require additional pain medication, you are responsible for notifying us, as stated below. 8. Medication Agreement: You are responsible for carefully reading and following our Medication Agreement. This must be signed before receiving any prescriptions from our practice. Safely store a copy of your signed Agreement. Violations to the Agreement will result in no further prescriptions. (Additional copies of our Medication Agreement are available upon  request.) 9. Laws, Rules, & Regulations: All patients are expected to follow all Federal and [employer], [employer], Rules, [employer]. Ignorance of the Laws does not constitute a valid excuse. The use of any illegal substances is prohibited. 10. Adopted CDC guidelines & recommendations: Target dosing levels will be at or below 60 MME/day. Use of benzodiazepines** is not recommended.  Exceptions: There are only two exceptions to the rule of not receiving pain medications from other Healthcare Providers. 1. Exception #1 (Emergencies): In the event of an emergency (i.e.: accident requiring emergency care), you are allowed to receive additional pain medication. However, you are responsible for: As soon as you are able, call our office [phone], at any time of the day or night, and leave a message stating your name, the date and nature of the emergency, and the name and dose of the medication prescribed. In the event that your call is answered by a member of our staff, make sure to document and save the date, time, and the name of the person that took your information.  2. Exception #2 (Planned Surgery): In the event that you are scheduled by another doctor or dentist to have any type of surgery or procedure, you are allowed (for a period no longer than 30 days), to receive additional pain medication, for the acute post-op pain. However, in this case, you are responsible for picking up a copy of our "Post-op Pain Management for Surgeons" handout, and giving it to your surgeon or dentist. This document is available at our office, and  does not require an appointment to obtain it. Simply go to our office during business hours (Monday-Thursday from 8:00 AM to 4:00 PM) (Friday 8:00 AM to 12:00 Noon) or if you have a scheduled appointment with [place], prior to your surgery, and ask for it by name. In addition, you will need to provide [place] with your name, name of your surgeon, type of surgery, and date of procedure or  surgery.  *Opioid medications include: morphine, codeine, oxycodone, oxymorphone, hydrocodone, hydromorphone, meperidine, tramadol, tapentadol, buprenorphine, fentanyl, methadone. **Benzodiazepine medications include: diazepam (Valium), alprazolam (Xanax), clonazepam (Klonopine), lorazepam (Ativan), clorazepate (Tranxene), chlordiazepoxide (Librium), estazolam (Prosom), oxazepam (Serax), temazepam (Restoril), triazolam (Halcion) (Last updated: 03/16/2017) ____________________________________________________________________________________________   BMI Assessment: Estimated body mass index is 41.93 kg/m as calculated from the following:   Height as of this encounter: '5\' 5"'  (1.651 m).   Weight as of this encounter: 252 lb (114.3 kg).  BMI interpretation table: BMI level Category Range association with higher incidence of chronic pain  <18 kg/m2 Underweight   18.5-24.9 kg/m2 Ideal body weight   25-29.9 kg/m2 Overweight Increased incidence by 20%  30-34.9 kg/m2 Obese (Class I) Increased incidence by 68%  35-39.9 kg/m2 Severe obesity (Class II) Increased incidence by 136%  >40 kg/m2 Extreme obesity (Class III) Increased incidence by 254%   Patient's current BMI Ideal Body weight  Body mass index is 41.93 kg/m. Ideal body weight: 61.5 kg (135 lb 9.3 oz) Adjusted ideal body weight: 82.6 kg (182 lb 2.4 oz)   BMI Readings from Last 4 Encounters:  12/07/17 41.93 kg/m  10/03/17 42.38 kg/m  10/02/17 40.35 kg/m  09/05/17 40.35 kg/m   Wt Readings from Last 4 Encounters:  12/07/17 252 lb (114.3 kg)  10/03/17 262 lb 9.6 oz (119.1 kg)  09/05/17 250 lb (113.4 kg)  06/05/17 245 lb (111.1 kg)

## 2017-12-07 NOTE — Progress Notes (Signed)
Nursing Pain Medication Assessment:  Safety precautions to be maintained throughout the outpatient stay will include: orient to surroundings, keep bed in low position, maintain call bell within reach at all times, provide assistance with transfer out of bed and ambulation.  Medication Inspection Compliance: Pill count conducted under aseptic conditions, in front of the patient. Neither the pills nor the bottle was removed from the patient's sight at any time. Once count was completed pills were immediately returned to the patient in their original bottle.  Medication: Oxycodone IR Pill/Patch Count: 87 of 120 pills remain Pill/Patch Appearance: Markings consistent with prescribed medication Bottle Appearance: Standard pharmacy container. Clearly labeled. Filled Date: 3611 / 10 / 2019 Last Medication intake:  Today

## 2017-12-13 LAB — TOXASSURE SELECT 13 (MW), URINE

## 2018-01-16 HISTORY — PX: COLONOSCOPY: SHX174

## 2018-01-18 ENCOUNTER — Telehealth: Payer: Self-pay | Admitting: Nurse Practitioner

## 2018-01-18 NOTE — Telephone Encounter (Signed)
No answer. LVM for patient to return call. 

## 2018-01-18 NOTE — Telephone Encounter (Signed)
Pt has questions about his oxycodone

## 2018-01-18 NOTE — Telephone Encounter (Signed)
Insurance is making patient change pharmacy, he needs to know what to do about scripts already sent in, how to get them changed.  From [facility] to [facility] in [place].

## 2018-01-19 NOTE — Telephone Encounter (Signed)
Spoke with patient to confirm Rx's needed, oxycodone for Janurary and February fill dates.  His insurance is making switch [facility]. Told patient that I would send message to [facility] today.

## 2018-01-19 NOTE — Telephone Encounter (Signed)
I called patient back re; practioners reply.   Patient states that if he fills [facility] at [facility] it will cost him an additional $40 per prescription.

## 2018-01-19 NOTE — Telephone Encounter (Signed)
Has he been to the pharmacy and have them run the Rx? If the cost it the same, he needs to fill those there and then he can change at the next visit.

## 2018-01-22 ENCOUNTER — Telehealth: Payer: Self-pay | Admitting: Nurse Practitioner

## 2018-01-22 ENCOUNTER — Telehealth: Payer: Self-pay | Admitting: *Deleted

## 2018-01-22 ENCOUNTER — Other Ambulatory Visit: Payer: Self-pay | Admitting: Nurse Practitioner

## 2018-01-22 DIAGNOSIS — G894 Chronic pain syndrome: Secondary | ICD-10-CM

## 2018-01-22 DIAGNOSIS — M792 Neuralgia and neuritis, unspecified: Secondary | ICD-10-CM

## 2018-01-22 MED ORDER — OXYCODONE HCL 5 MG PO TABS: 5.0000 mg | ORAL_TABLET | Freq: Four times a day (QID) | ORAL | 0 refills | Status: DC | PRN

## 2018-01-22 NOTE — Telephone Encounter (Signed)
[facility] and stated that he needs PA for his oxycodone because his insurance will only pay for a 7 day supply

## 2018-01-22 NOTE — Telephone Encounter (Signed)
I will resend unable to send on Friday

## 2018-01-23 ENCOUNTER — Telehealth: Payer: Self-pay | Admitting: *Deleted

## 2018-01-23 NOTE — Telephone Encounter (Signed)
approved

## 2018-02-27 HISTORY — PX: OTHER SURGICAL HISTORY: SHX169

## 2018-02-28 ENCOUNTER — Ambulatory Visit: Payer: BLUE CROSS/BLUE SHIELD | Attending: Nurse Practitioner | Admitting: Nurse Practitioner

## 2018-02-28 ENCOUNTER — Encounter: Payer: Self-pay | Admitting: Nurse Practitioner

## 2018-02-28 VITALS — BP 141/90 | HR 91 | Temp 98.2°F | Resp 16 | Ht 66.0 in | Wt 252.0 lb

## 2018-02-28 DIAGNOSIS — M47816 Spondylosis without myelopathy or radiculopathy, lumbar region: Secondary | ICD-10-CM | POA: Diagnosis present

## 2018-02-28 DIAGNOSIS — M25561 Pain in right knee: Secondary | ICD-10-CM | POA: Diagnosis not present

## 2018-02-28 DIAGNOSIS — G8929 Other chronic pain: Secondary | ICD-10-CM | POA: Insufficient documentation

## 2018-02-28 DIAGNOSIS — M792 Neuralgia and neuritis, unspecified: Secondary | ICD-10-CM | POA: Diagnosis present

## 2018-02-28 DIAGNOSIS — G894 Chronic pain syndrome: Secondary | ICD-10-CM | POA: Diagnosis not present

## 2018-02-28 MED ORDER — OXYCODONE HCL 5 MG PO TABS: 5.0000 mg | ORAL_TABLET | Freq: Four times a day (QID) | ORAL | 0 refills | Status: DC | PRN

## 2018-02-28 MED ORDER — GABAPENTIN 600 MG PO TABS: 600.0000 mg | ORAL_TABLET | Freq: Four times a day (QID) | ORAL | 0 refills | Status: DC

## 2018-02-28 NOTE — Patient Instructions (Signed)
____________________________________________________________________________________________  Medication Rules  Purpose: To inform patients, and their family members, of our rules and regulations.  Applies to: All patients receiving prescriptions (written or electronic).  Pharmacy of record: Pharmacy where electronic prescriptions will be sent. If written prescriptions are taken to a different pharmacy, please inform the nursing staff. The pharmacy listed in the electronic medical record should be the one where you would like electronic prescriptions to be sent.  Electronic prescriptions: In compliance with the [place] Strengthen Opioid Misuse Prevention (STOP) Act of 2017 (Session Law 2017-74/H243), effective January 17, 2018, all controlled substances must be electronically prescribed. Calling prescriptions to the pharmacy will cease to exist.  Prescription refills: Only during scheduled appointments. Applies to all prescriptions.  NOTE: The following applies primarily to controlled substances (Opioid* Pain Medications).   Patient's responsibilities: 1. Pain Pills: Bring all pain pills to every appointment (except for procedure appointments). 2. Pill Bottles: Bring pills in original pharmacy bottle. Always bring the newest bottle. Bring bottle, even if empty. 3. Medication refills: You are responsible for knowing and keeping track of what medications you take and those you need refilled. The day before your appointment: write a list of all prescriptions that need to be refilled. The day of the appointment: give the list to the admitting nurse. Prescriptions will be written only during appointments. No prescriptions will be written on procedure days. If you forget a medication: it will not be "Called in", "Faxed", or "electronically sent". You will need to get another appointment to get these prescribed. No early refills. Do not call asking to have your prescription filled  early. 4. Prescription Accuracy: You are responsible for carefully inspecting your prescriptions before leaving our office. Have the discharge nurse carefully go over each prescription with you, before taking them home. Make sure that your name is accurately spelled, that your address is correct. Check the name and dose of your medication to make sure it is accurate. Check the number of pills, and the written instructions to make sure they are clear and accurate. Make sure that you are given enough medication to last until your next medication refill appointment. 5. Taking Medication: Take medication as prescribed. When it comes to controlled substances, taking less pills or less frequently than prescribed is permitted and encouraged. Never take more pills than instructed. Never take medication more frequently than prescribed.  6. Inform other Doctors: Always inform, all of your healthcare providers, of all the medications you take. 7. Pain Medication from other Providers: You are not allowed to accept any additional pain medication from any other Doctor or Healthcare provider. There are two exceptions to this rule. (see below) In the event that you require additional pain medication, you are responsible for notifying us, as stated below. 8. Medication Agreement: You are responsible for carefully reading and following our Medication Agreement. This must be signed before receiving any prescriptions from our practice. Safely store a copy of your signed Agreement. Violations to the Agreement will result in no further prescriptions. (Additional copies of our Medication Agreement are available upon request.) 9. Laws, Rules, & Regulations: All patients are expected to follow all Federal and State Laws, Statutes, Rules, & Regulations. Ignorance of the Laws does not constitute a valid excuse. The use of any illegal substances is prohibited. 10. Adopted CDC guidelines & recommendations: Target dosing levels will be  at or below 60 MME/day. Use of benzodiazepines** is not recommended.  Exceptions: There are only two exceptions to the rule of not   receiving pain medications from other Healthcare Providers. 1. Exception #1 (Emergencies): In the event of an emergency (i.e.: accident requiring emergency care), you are allowed to receive additional pain medication. However, you are responsible for: As soon as you are able, call our office [phone], at any time of the day or night, and leave a message stating your name, the date and nature of the emergency, and the name and dose of the medication prescribed. In the event that your call is answered by a member of our staff, make sure to document and save the date, time, and the name of the person that took your information.  2. Exception #2 (Planned Surgery): In the event that you are scheduled by another doctor or dentist to have any type of surgery or procedure, you are allowed (for a period no longer than 30 days), to receive additional pain medication, for the acute post-op pain. However, in this case, you are responsible for picking up a copy of our "Post-op Pain Management for Surgeons" handout, and giving it to your surgeon or dentist. This document is available at our office, and does not require an appointment to obtain it. Simply go to our office during business hours (Monday-Thursday from 8:00 AM to 4:00 PM) (Friday 8:00 AM to 12:00 Noon) or if you have a scheduled appointment with us, prior to your surgery, and ask for it by name. In addition, you will need to provide us with your name, name of your surgeon, type of surgery, and date of procedure or surgery.  *Opioid medications include: morphine, codeine, oxycodone, oxymorphone, hydrocodone, hydromorphone, meperidine, tramadol, tapentadol, buprenorphine, fentanyl, methadone. **Benzodiazepine medications include: diazepam (Valium), alprazolam (Xanax), clonazepam (Klonopine), lorazepam (Ativan), clorazepate  (Tranxene), chlordiazepoxide (Librium), estazolam (Prosom), oxazepam (Serax), temazepam (Restoril), triazolam (Halcion) (Last updated: 03/16/2017) ____________________________________________________________________________________________    

## 2018-02-28 NOTE — Progress Notes (Signed)
Nursing Pain Medication Assessment:  Safety precautions to be maintained throughout the outpatient stay will include: orient to surroundings, keep bed in low position, maintain call bell within reach at all times, provide assistance with transfer out of bed and ambulation.  Medication Inspection Compliance: Pill count conducted under aseptic conditions, in front of the patient. Neither the pills nor the bottle was removed from the patient's sight at any time. Once count was completed pills were immediately returned to the patient in their original bottle.  Medication: Oxycodone IR Pill/Patch Count: 107 of 120 pills remain Pill/Patch Appearance: Markings consistent with prescribed medication Bottle Appearance: Standard pharmacy container. Clearly labeled. Filled Date: 02 / 06 / 2020 Last Medication intake:  Today

## 2018-02-28 NOTE — Progress Notes (Signed)
Patient's Name: Adam Petty.  MRN: [id number]  Referring Provider: Zeb Comfort, MD  DOB: 1960/03/14  PCP: Zeb Comfort, MD  DOS: 02/28/2018  Note by: Dionisio David, NP  Service setting: Ambulatory outpatient  Specialty: Interventional Pain Management  Location: [facility] (AMB) Pain Management Facility    Patient type: Established   HPI  Reason for Visit: [name] is a 58 y.o. year old, male patient, who comes today with a chief complaint of Back Pain (lower worse on the right ) and Hand Pain (s/p CTR and trigger finger release on thumb 02/27/18)  Pain Assessment: Today, [name] describes the severity of the Chronic pain as a 5 /10. He indicates the location/referral of the pain to be Back Right, Left(hand pain secondary to surgery )/back pain going into both legs . Onset was: More than a month ago. The quality of pain is described as Discomfort, Constant, Pressure, Sharp, Stabbing, Numbness, Tingling. Temporal description, or timing of pain is: Constant. Possible modifying factors: heat, medications takes the edge off. [name]  height is _0  (1.676 m) and weight is 252 lb (114.3 kg). His oral temperature is 98.2 F (36.8 C). His blood pressure is 141/90 (abnormal) and his pulse is 91. His respiration is 16 and oxygen saturation is 98%.  He has numbness and tingling in his legs. He does have weakness in his right leg. He status post left carpal tunnel release on yesterday with [facility] orthopedics.  He admits that he is doing well and will be return to work today.  Controlled Substance Pharmacotherapy Assessment REMS (Risk Evaluation and Mitigation Strategy)  Analgesic: Oxycodone IR 5 mg 1 tablet by mouth every 6 hours (20 mg/day)  MME/day: 30 mg/day Janett Billow, RN  02/28/2018  8:45 AM  Sign when Signing Visit Nursing Pain Medication Assessment:  Safety precautions to be maintained throughout the outpatient stay will include: orient to  surroundings, keep bed in low position, maintain call bell within reach at all times, provide assistance with transfer out of bed and ambulation.  Medication Inspection Compliance: Pill count conducted under aseptic conditions, in front of the patient. Neither the pills nor the bottle was removed from the patient's sight at any time. Once count was completed pills were immediately returned to the patient in their original bottle.  Medication: Oxycodone IR Pill/Patch Count: 107 of 120 pills remain Pill/Patch Appearance: Markings consistent with prescribed medication Bottle Appearance: Standard pharmacy container. Clearly labeled. Filled Date: 02 / 06 / 2020 Last Medication intake:  Today   Pharmacokinetics: Liberation and absorption (onset of action): WNL Distribution (time to peak effect): WNL Metabolism and excretion (duration of action): WNL         Pharmacodynamics: Desired effects: Analgesia: [name] reports >50% benefit. Functional ability: Patient reports that medication allows him to accomplish basic ADLs Clinically meaningful improvement in function (CMIF): Sustained CMIF goals met Perceived effectiveness: Described as relatively effective, allowing for increase in activities of daily living (ADL) Undesirable effects: Side-effects or Adverse reactions: None reported Monitoring: [place] PMP: Online review of the past 64-monthperiod conducted. Compliant with practice rules and regulations Last UDS on record: Summary  Date Value Ref Range Status  12/07/2017 FINAL  Final    Comment:    ==================================================================== TOXASSURE SELECT 13 (MW) ==================================================================== Test                             Result  Flag       Units Drug Present and Declared for Prescription Verification   Oxycodone                      977          EXPECTED   ng/mg creat   Oxymorphone                    917           EXPECTED   ng/mg creat   Noroxycodone                   1160         EXPECTED   ng/mg creat   Noroxymorphone                 114          EXPECTED   ng/mg creat    Sources of oxycodone are scheduled prescription medications.    Oxymorphone, noroxycodone, and noroxymorphone are expected    metabolites of oxycodone. Oxymorphone is also available as a    scheduled prescription medication. ==================================================================== Test                      Result    Flag   Units      Ref Range   Creatinine              70               mg/dL      >=20 ==================================================================== Declared Medications:  The flagging and interpretation on this report are based on the  following declared medications.  Unexpected results may arise from  inaccuracies in the declared medications.  **Note: The testing scope of this panel includes these medications:  Oxycodone  **Note: The testing scope of this panel does not include following  reported medications:  Calcium  Cyanocobalamin  Gabapentin  Ibuprofen  Insulin  Ketoconazole  Liraglutide (Victoza)  Metformin  Naproxen  Potassium  Ramipril  Rosuvastatin ==================================================================== For clinical consultation, please call [phone]. ====================================================================    UDS interpretation: Compliant          Medication Assessment Form: Reviewed. Patient indicates being compliant with therapy Treatment compliance: Compliant Risk Assessment Profile: Aberrant behavior: See initial evaluations. None observed or detected today Comorbid factors increasing risk of overdose: See initial evaluation. No additional risks detected today Opioid risk tool (ORT):  Opioid Risk  02/28/2018  Alcohol 3  Illegal Drugs 3  Rx Drugs 4  Alcohol 0  Illegal Drugs 0  Rx Drugs 0  Age between 16-45 years  0  History of  Preadolescent Sexual Abuse -  Psychological Disease 0  Depression 0  Opioid Risk Tool Scoring 10  Opioid Risk Interpretation High Risk    ORT Scoring interpretation table:  Score <3 = Low Risk for SUD  Score between 4-7 = Moderate Risk for SUD  Score >8 = High Risk for Opioid Abuse   Risk of substance use disorder (SUD): Moderate-to-High  Risk Mitigation Strategies:  Patient Counseling: Covered Patient-Prescriber Agreement (PPA): Present and active  Notification to other healthcare providers: Done  Pharmacologic Plan: No change in therapy, at this time.             ROS  Constitutional: Denies any fever or chills Gastrointestinal: No reported hemesis, hematochezia, vomiting, or acute GI distress Musculoskeletal: Denies any acute onset joint swelling, redness, loss of  ROM, or weakness Neurological: No reported episodes of acute onset apraxia, aphasia, dysarthria, agnosia, amnesia, paralysis, loss of coordination, or loss of consciousness  Medication Review  Semaglutide(0.25 or 0.5MG/DOS), Testosterone, cyanocobalamin, gabapentin, ibuprofen, insulin aspart, insulin degludec, ketoconazole, liraglutide, metFORMIN, naproxen sodium, oxyCODONE, phentermine, potassium citrate, ramipril, and rosuvastatin  History Review  Allergy: Mr. Bourdon has No Known Allergies. Drug: Mr. Gamblin  reports no history of drug use. Alcohol:  reports current alcohol use. Tobacco:  reports that he has never smoked. He has never used smokeless tobacco. Social: Mr. Radu  reports that he has never smoked. He has never used smokeless tobacco. He reports current alcohol use. He reports that he does not use drugs. 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 Surgical: Mr. Dumlao  has a past surgical history that includes Tonsillectomy; Shoulder surgery; Palate surgery; Cervical fusion; Foot surgery; Lumbar laminectomy/decompression microdiscectomy (N/A, 02/06/2013); Knee arthroscopy (Right, 10/02/2013); Knee arthroscopy with medial menisectomy (Right, 03/25/2015); Knee bursectomy (Right, 03/25/2015); Cystoscopy w/ retrogrades (Right, 11/19/2015); Cystoscopy w/ retrogrades (Right, 10/03/2017); Ureteroscopy (Right, 10/03/2017); and CTR (Left, 02/27/2018). Family: family history includes COPD in his mother; Cancer in his mother; Diabetes in his father; Heart disease in his father and mother. Problem List: Mr. Vanderweele has Chronic sacroiliac joint pain (Bilateral) (R>L) on their pertinent problem list.  Lab Review  Kidney Function Lab Results  Component Value Date   BUN 12 10/02/2017   CREATININE 1.06 10/02/2017   GFRAA >60 10/02/2017   GFRNONAA >60 10/02/2017  Liver Function   Imaging Review  DG Hand 2 View Left CLINICAL DATA:  Hand pain for several months, no known injury, initial encounter  EXAM: LEFT HAND - 2 VIEW  COMPARISON:  None.  FINDINGS: There is no evidence of fracture or dislocation. There is no evidence of arthropathy or other focal bone abnormality. Soft tissues are unremarkable.  IMPRESSION: No acute abnormality noted.  Electronically Signed   By: Inez Catalina M.D.   On: 11/29/2017 11:35 Note: Above imaging results reviewed.        Physical Exam  General appearance: Well nourished, well developed, and well hydrated. In no apparent acute distress Mental status: Alert, oriented x 3 (person, place, & time)       Respiratory: No evidence of acute respiratory distress Eyes: PERLA Vitals: BP (!) 141/90 (BP Location: Right Arm, Patient Position: Sitting, Cuff Size: Normal)   Pulse 91   Temp 98.2 F (36.8 C) (Oral)   Resp 16   Ht _0  (1.676 m)   Wt 252 lb (114.3 kg)   SpO2 98%   BMI 40.67 kg/m  BMI: Estimated  body mass index is 40.67 kg/m as calculated from the following:   Height as of this encounter: _1  (1.676 m).   Weight as of this encounter: 252 lb (114.3 kg). Ideal: Ideal body weight: 63.8 kg (140 lb 10.5 oz) Adjusted ideal body weight: 84 kg (185 lb 3.1 oz)  Upper Extremity (UE) Exam    Side: Right upper extremity  Side: Left upper extremity  Skin & Extremity Inspection: Skin color, temperature, and hair growth are WNL. No peripheral edema or cyanosis. No masses, redness, swelling, asymmetry, or associated skin lesions. No contractures.  Skin & Extremity Inspection: Skin color, temperature, and hair growth are WNL. No peripheral edema or  cyanosis. No masses, redness, swelling, asymmetry, or associated skin lesions. No contractures. guaze and surgical cast dressing  Functional ROM: Unrestricted ROM          Functional ROM: Unrestricted ROM          Muscle Tone/Strength: Functionally intact. No obvious neuro-muscular anomalies detected.  Muscle Tone/Strength: Functionally intact. No obvious neuro-muscular anomalies detected.  Sensory (Neurological): Unimpaired          Sensory (Neurological): Unimpaired          Palpation: No palpable anomalies              Palpation: No palpable anomalies                   Lumbar Spine Area Exam  Skin & Axial Inspection: No masses, redness, or swelling Alignment: Symmetrical Functional ROM: Unrestricted ROM       Stability: No instability detected Muscle Tone/Strength: Functionally intact. No obvious neuro-muscular anomalies detected. Sensory (Neurological): Unimpaired Palpation: No palpable anomalies       Provocative Tests: Hyperextension/rotation test: deferred today       Lumbar quadrant test (Kemp's test): deferred today       Lateral bending test: deferred today       Patrick's Maneuver: deferred today                    Assessment   Status Diagnosis  Controlled Controlled Controlled 1. Lumbar spondylosis   2. Chronic pain of right knee    3. Neurogenic pain   4. Chronic pain syndrome      Updated Problems: No problems updated.  Plan of Care  Medications: I have changed [name]. "Frank"'s oxyCODONE, oxyCODONE, and oxyCODONE. I am also having him maintain his rosuvastatin, NOVOLOG FLEXPEN, cyanocobalamin, potassium citrate, metFORMIN, insulin degludec, liraglutide, ketoconazole, ibuprofen, naproxen sodium, ramipril, Testosterone, phentermine, Semaglutide(0.25 or 0.5MG/DOS), and gabapentin.  Administered today: [name]. "Pilar Plate" had no medications administered during this visit.  Orders:  No orders of the defined types were placed in this encounter.  Interventional options: Planned follow-up:    Return in about 3 months (around 06/05/2018) for MedMgmt. Return Date: 06/04/2018    Considering: Palliativebilateral lumbar facet block Palliativebilateral lumbar facet radiofrequencyablation (Right side done 04/13/2016) (100% relief) Diagnostic right knee genicular nerve block.  Possible right knee genicular nerve radiofrequencyablation   Palliative PRN treatment(s): Diagnostic Right Suprascapular nerve Block#2 Possible Right Suprascapular nerveRFA     Note by: Dionisio David, NP Date: 02/28/2018; Time: 1:17 PM

## 2018-03-29 ENCOUNTER — Other Ambulatory Visit: Payer: Self-pay | Admitting: Urology

## 2018-03-29 DIAGNOSIS — N200 Calculus of kidney: Secondary | ICD-10-CM

## 2018-04-03 ENCOUNTER — Ambulatory Visit: Payer: BLUE CROSS/BLUE SHIELD

## 2018-04-18 DIAGNOSIS — I152 Hypertension secondary to endocrine disorders: Secondary | ICD-10-CM | POA: Insufficient documentation

## 2018-04-18 DIAGNOSIS — E1159 Type 2 diabetes mellitus with other circulatory complications: Secondary | ICD-10-CM | POA: Insufficient documentation

## 2018-06-04 ENCOUNTER — Ambulatory Visit: Payer: Self-pay | Attending: Nurse Practitioner | Admitting: Nurse Practitioner

## 2018-06-04 ENCOUNTER — Other Ambulatory Visit: Payer: Self-pay

## 2018-06-04 DIAGNOSIS — G894 Chronic pain syndrome: Secondary | ICD-10-CM

## 2018-06-04 DIAGNOSIS — M47816 Spondylosis without myelopathy or radiculopathy, lumbar region: Secondary | ICD-10-CM

## 2018-06-04 DIAGNOSIS — G8929 Other chronic pain: Secondary | ICD-10-CM

## 2018-06-04 DIAGNOSIS — M533 Sacrococcygeal disorders, not elsewhere classified: Secondary | ICD-10-CM

## 2018-06-04 DIAGNOSIS — M792 Neuralgia and neuritis, unspecified: Secondary | ICD-10-CM

## 2018-06-04 MED ORDER — OXYCODONE HCL 5 MG PO TABS: 5.0000 mg | ORAL_TABLET | Freq: Four times a day (QID) | ORAL | 0 refills | Status: DC | PRN

## 2018-06-04 MED ORDER — GABAPENTIN 600 MG PO TABS: 600.0000 mg | ORAL_TABLET | Freq: Four times a day (QID) | ORAL | 0 refills | Status: DC

## 2018-06-04 NOTE — Progress Notes (Signed)
Pain Management Encounter Note - Virtual Visit via Telephone Telehealth (real-time audio visits between healthcare provider and patient).  Patient's Phone No. & Preferred Pharmacy:  [phone] (home); [phone] (mobile); (Preferred) [phone]  [id number] [place], [place] Pet[place] - [address] AT [facility] [address] [place] [address] [place] [address] Phone: [phone] Fax: [phone]   Pre-screening note:  Our staff contacted [name] and offered him an "in person", "face-to-face" appointment versus a telephone encounter. He indicated preferring the telephone encounter, at this time.  Reason for Virtual Visit: COVID-19*  Social distancing based on CDC and AMA recommendations.   I contacted Adam Petty. on 06/04/2018 at 1:00 PM by telephone and clearly identified myself as Adam Ranger, NP. I verified that I was speaking with the correct person using two identifiers (Name and date of birth: [birth year]).  Advanced Informed Consent I sought verbal advanced consent from Adam Petty. for telemedicine interactions and virtual visit. I informed [name] of the security and privacy concerns, risks, and limitations associated with performing an evaluation and management service by telephone. I also informed [name] of the availability of "in person" appointments and I informed him of the possibility of a patient responsible charge related to this service. [name] expressed understanding and agreed to proceed.   Historic Elements   [name] is a 58 y.o. year old, male patient evaluated today after his last encounter by our practice on 02/28/2018. 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 He also  has a past surgical history that includes Tonsillectomy; Shoulder surgery; Palate surgery; Cervical fusion; Foot surgery; Lumbar laminectomy/decompression microdiscectomy (N/A, 02/06/2013); Knee arthroscopy (Right, 10/02/2013); Knee arthroscopy with medial menisectomy (Right, 03/25/2015); Knee bursectomy (Right, 03/25/2015); Cystoscopy w/ retrogrades (Right, 11/19/2015); Cystoscopy w/ retrogrades (Right, 10/03/2017); Ureteroscopy (Right, 10/03/2017); and CTR (Left, 02/27/2018). [name] has a current medication list which includes the following prescription(s): cyanocobalamin, gabapentin, ibuprofen, insulin degludec, ketoconazole, liraglutide, metformin, naproxen sodium, novolog flexpen, oxycodone, oxycodone, oxycodone, phentermine, potassium citrate, ramipril, rosuvastatin, semaglutide(0.25 or 0.5mg /dos), and testosterone. He  reports that he has never smoked. He has never used smokeless tobacco. He reports current alcohol use. He reports that he does not use drugs. [name] has No Known Allergies.   HPI  I last saw him on 02/28/2018. He is being evaluated for medication management. He rates his pain 5/10. He has lower right sided back pain. He does has pain that goes down his right leg into his foot. He describes it as a stabbing and electrical shock type pain.  He denies any weakness. He admits that the pain sometimes debilitatinig. However most of the time it is in the 4-5/10 range. He denies any new pain related concerns.  Pharmacotherapy Assessment  Analgesic: Oxycodone IR 5 mg 1 tablet by mouth every 6 hours (20 mg/day)  MME/day: 30 mg/day  Monitoring: Pharmacotherapy: No side-effects or adverse reactions reported. [place] PMP: PDMP not reviewed this encounter.       Compliance: No problems identified. Plan: Refer to "POC".  Review of recent tests  DG Hand 2 View Left CLINICAL DATA:  Hand pain for several months, no known injury, initial encounter  EXAM: LEFT  HAND - 2 VIEW  COMPARISON:  None.  FINDINGS: There is no evidence of fracture or dislocation. There is no evidence of arthropathy or other focal bone abnormality. Soft tissues are unremarkable.  IMPRESSION: No acute abnormality noted.  Electronically Signed   By: Alcide CleverMark  Petty M.D.   On: 11/29/2017 11:35   Clinical Support on 12/07/2017  Component Date Value Ref Range Status  . Summary 12/07/2017 FINAL   Final   Comment: ==================================================================== TOXASSURE SELECT 13 (MW) ==================================================================== Test                             Result       Flag       Units Drug Present and Declared for Prescription Verification   Oxycodone                      977          EXPECTED   ng/mg creat   Oxymorphone                    917          EXPECTED   ng/mg creat   Noroxycodone                   1160         EXPECTED   ng/mg creat   Noroxymorphone                 114          EXPECTED   ng/mg creat    Sources of oxycodone are scheduled prescription medications.    Oxymorphone, noroxycodone, and noroxymorphone are expected    metabolites of oxycodone. Oxymorphone is also available as a    scheduled prescription medication. ==================================================================== Test                      Result    Flag   Units      Ref Range   Creatinine              70               mg/dL      >=16>=20 ======                          ============================================================== Declared Medications:  The flagging and interpretation on this report are based on the  following declared medications.  Unexpected results may arise from  inaccuracies in the declared medications.  **Note: The testing scope of this panel includes these medications:  Oxycodone  **Note: The testing scope of this panel does not include following  reported medications:  Calcium  Cyanocobalamin   Gabapentin  Ibuprofen  Insulin  Ketoconazole  Liraglutide (Victoza)  Metformin  Naproxen  Potassium  Ramipril  Rosuvastatin ==================================================================== For clinical consultation, please call [phone]. ====================================================================    Assessment  The primary encounter diagnosis was Lumbar spondylosis. Diagnoses of Neurogenic pain, Chronic pain syndrome, and Chronic sacroiliac joint pain (Bilateral) (R>L) were also pertinent to this visit.  Plan of Care  I am having Adam BergFranklin W. Iodice Jr. "Homero FellersFrank" maintain his rosuvastatin, NovoLOG FlexPen, cyanocobalamin, potassium citrate, metFORMIN, insulin degludec, liraglutide, ketoconazole, ibuprofen, naproxen sodium, ramipril, Testosterone, phentermine, Semaglutide(0.25 or 0.5MG /DOS), gabapentin, oxyCODONE, oxyCODONE, and oxyCODONE.  Pharmacotherapy (Medications Ordered): Meds ordered this encounter  Medications  . gabapentin (NEURONTIN) 600 MG tablet    Sig: Take  1 tablet (600 mg total) by mouth every 6 (six) hours.    Dispense:  360 tablet    Refill:  0    Do not place this medication, or any other prescription from our practice, on "Automatic Refill". Patient may have prescription filled one day early if pharmacy is closed on scheduled refill date.    Order Specific Question:   Supervising Provider    Answer:   Delano Metz [phone]  . oxyCODONE (OXY IR/ROXICODONE) 5 MG immediate release tablet    Sig: Take 1 tablet (5 mg total) by mouth every 6 (six) hours as needed for up to 30 days for severe pain.    Dispense:  120 tablet    Refill:  0    Do not place this medication, or any other prescription from our practice, on "Automatic Refill". Patient may have prescription filled one day early if pharmacy is closed on scheduled refill date.    Order Specific Question:   Supervising Provider    Answer:   Delano Metz [phone]  . oxyCODONE (OXY  IR/ROXICODONE) 5 MG immediate release tablet    Sig: Take 1 tablet (5 mg total) by mouth every 6 (six) hours as needed for up to 30 days for severe pain.    Dispense:  120 tablet    Refill:  0    Do not place this medication, or any other prescription from our practice, on "Automatic Refill". Patient may have prescription filled one day early if pharmacy is closed on scheduled refill date.    Order Specific Question:   Supervising Provider    Answer:   Delano Metz [phone]  . oxyCODONE (OXY IR/ROXICODONE) 5 MG immediate release tablet    Sig: Take 1 tablet (5 mg total) by mouth every 6 (six) hours as needed for up to 30 days for severe pain.    Dispense:  120 tablet    Refill:  0    Do not place this medication, or any other prescription from our practice, on "Automatic Refill". Patient may have prescription filled one day early if pharmacy is closed on scheduled refill date.    Order Specific Question:   Supervising Provider    Answer:   Delano Metz [phone]   Orders:  No orders of the defined types were placed in this encounter.  Follow-up plan:   Return in about 3 months (around 09/04/2018) for MedMgmt.   I discussed the assessment and treatment plan with the patient. The patient was provided an opportunity to ask questions and all were answered. The patient agreed with the plan and demonstrated an understanding of the instructions.  Patient advised to call back or seek an in-person evaluation if the symptoms or condition worsens.  Total duration of non-face-to-face encounter: 14 minutes.  Note by: Adam Ranger, NP Date: 06/04/2018; Time: 1:00 PM  Disclaimer:  * Given the special circumstances of the COVID-19 pandemic, the federal government has announced that the Office for Civil Rights (OCR) will exercise its enforcement discretion and will not impose penalties on physicians using telehealth in the event of noncompliance with regulatory requirements under the [employer] Portability and Accountability Act (HIPAA) in connection with the good faith provision of telehealth during the COVID-19 national public health emergency. (AMA)

## 2018-06-04 NOTE — Patient Instructions (Signed)
____________________________________________________________________________________________  Medication Rules  Purpose: To inform patients, and their family members, of our rules and regulations.  Applies to: All patients receiving prescriptions (written or electronic).  Pharmacy of record: Pharmacy where electronic prescriptions will be sent. If written prescriptions are taken to a different pharmacy, please inform the nursing staff. The pharmacy listed in the electronic medical record should be the one where you would like electronic prescriptions to be sent.  Electronic prescriptions: In compliance with the [place] Strengthen Opioid Misuse Prevention (STOP) Act of 2017 (Session Law 2017-74/H243), effective January 17, 2018, all controlled substances must be electronically prescribed. Calling prescriptions to the pharmacy will cease to exist.  Prescription refills: Only during scheduled appointments. Applies to all prescriptions.  NOTE: The following applies primarily to controlled substances (Opioid* Pain Medications).   Patient's responsibilities: 1. Pain Pills: Bring all pain pills to every appointment (except for procedure appointments). 2. Pill Bottles: Bring pills in original pharmacy bottle. Always bring the newest bottle. Bring bottle, even if empty. 3. Medication refills: You are responsible for knowing and keeping track of what medications you take and those you need refilled. The day before your appointment: write a list of all prescriptions that need to be refilled. The day of the appointment: give the list to the admitting nurse. Prescriptions will be written only during appointments. No prescriptions will be written on procedure days. If you forget a medication: it will not be "Called in", "Faxed", or "electronically sent". You will need to get another appointment to get these prescribed. No early refills. Do not call asking to have your prescription filled  early. 4. Prescription Accuracy: You are responsible for carefully inspecting your prescriptions before leaving our office. Have the discharge nurse carefully go over each prescription with you, before taking them home. Make sure that your name is accurately spelled, that your address is correct. Check the name and dose of your medication to make sure it is accurate. Check the number of pills, and the written instructions to make sure they are clear and accurate. Make sure that you are given enough medication to last until your next medication refill appointment. 5. Taking Medication: Take medication as prescribed. When it comes to controlled substances, taking less pills or less frequently than prescribed is permitted and encouraged. Never take more pills than instructed. Never take medication more frequently than prescribed.  6. Inform other Doctors: Always inform, all of your healthcare providers, of all the medications you take. 7. Pain Medication from other Providers: You are not allowed to accept any additional pain medication from any other Doctor or Healthcare provider. There are two exceptions to this rule. (see below) In the event that you require additional pain medication, you are responsible for notifying us, as stated below. 8. Medication Agreement: You are responsible for carefully reading and following our Medication Agreement. This must be signed before receiving any prescriptions from our practice. Safely store a copy of your signed Agreement. Violations to the Agreement will result in no further prescriptions. (Additional copies of our Medication Agreement are available upon request.) 9. Laws, Rules, & Regulations: All patients are expected to follow all Federal and State Laws, Statutes, Rules, & Regulations. Ignorance of the Laws does not constitute a valid excuse. The use of any illegal substances is prohibited. 10. Adopted CDC guidelines & recommendations: Target dosing levels will be  at or below 60 MME/day. Use of benzodiazepines** is not recommended.  Exceptions: There are only two exceptions to the rule of not   receiving pain medications from other Healthcare Providers. 1. Exception #1 (Emergencies): In the event of an emergency (i.e.: accident requiring emergency care), you are allowed to receive additional pain medication. However, you are responsible for: As soon as you are able, call our office [phone], at any time of the day or night, and leave a message stating your name, the date and nature of the emergency, and the name and dose of the medication prescribed. In the event that your call is answered by a member of our staff, make sure to document and save the date, time, and the name of the person that took your information.  2. Exception #2 (Planned Surgery): In the event that you are scheduled by another doctor or dentist to have any type of surgery or procedure, you are allowed (for a period no longer than 30 days), to receive additional pain medication, for the acute post-op pain. However, in this case, you are responsible for picking up a copy of our "Post-op Pain Management for Surgeons" handout, and giving it to your surgeon or dentist. This document is available at our office, and does not require an appointment to obtain it. Simply go to our office during business hours (Monday-Thursday from 8:00 AM to 4:00 PM) (Friday 8:00 AM to 12:00 Noon) or if you have a scheduled appointment with us, prior to your surgery, and ask for it by name. In addition, you will need to provide us with your name, name of your surgeon, type of surgery, and date of procedure or surgery.  *Opioid medications include: morphine, codeine, oxycodone, oxymorphone, hydrocodone, hydromorphone, meperidine, tramadol, tapentadol, buprenorphine, fentanyl, methadone. **Benzodiazepine medications include: diazepam (Valium), alprazolam (Xanax), clonazepam (Klonopine), lorazepam (Ativan), clorazepate  (Tranxene), chlordiazepoxide (Librium), estazolam (Prosom), oxazepam (Serax), temazepam (Restoril), triazolam (Halcion) (Last updated: 03/16/2017) ____________________________________________________________________________________________    

## 2018-07-11 ENCOUNTER — Telehealth: Payer: Self-pay

## 2018-07-11 NOTE — Telephone Encounter (Signed)
He wants one of the nurses to call him regarding a new medicine his urologist is putting him on.

## 2018-08-23 ENCOUNTER — Telehealth: Payer: Self-pay

## 2018-08-23 NOTE — Telephone Encounter (Signed)
Thanks

## 2018-08-23 NOTE — Telephone Encounter (Signed)
He wanted to let you know that his urinary doctor prescribed him Nugenta (?) I think he may mean Nucynta. He just wanted to let you know he was prescribed this.

## 2018-08-30 ENCOUNTER — Telehealth: Payer: Self-pay | Admitting: *Deleted

## 2018-08-30 NOTE — Telephone Encounter (Signed)
Attempted to call for pre procedure assessment. Messages left on home and mobile numers.

## 2018-09-02 NOTE — Progress Notes (Signed)
Pain Management Virtual Encounter Note - Virtual Visit via Telephone Telehealth (real-time audio visits between healthcare provider and patient).   Patient's Phone No. & Preferred Pharmacy:  [phone] (home); [phone] (mobile); (Preferred) [phone] [email or web address]  [facility] [place], [address] [place] [address] Phone: [phone] Fax: [phone]    Pre-screening note:  Our staff contacted [name] and offered him an "in person", "face-to-face" appointment versus a telephone encounter. He indicated preferring the telephone encounter, at this time.   Reason for Virtual Visit: COVID-19*  Social distancing based on CDC and AMA recommendations.   I contacted [name]. on 09/03/2018 via telephone.      I clearly identified myself as Gaspar Cola, MD. I verified that I was speaking with the correct person using two identifiers (Name: Adam Wain., and date of birth: 1960/10/13).  Advanced Informed Consent I sought verbal advanced consent from [name]. for virtual visit interactions. I informed Adam Petty of possible security and privacy concerns, risks, and limitations associated with providing "not-in-person" medical evaluation and management services. I also informed Adam Petty of the availability of "in-person" appointments. Finally, I informed him that there would be a charge for the virtual visit and that he could be  personally, fully or partially, financially responsible for it. Adam Petty expressed understanding and agreed to proceed.   Historic Elements   [name] is a 58 y.o. year old, male patient evaluated today after his last encounter by our practice on 08/30/2018. Mr. 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 He also  has a past surgical history that includes Tonsillectomy; Shoulder surgery; Palate surgery; Cervical fusion; Foot surgery; Lumbar laminectomy/decompression microdiscectomy (N/A, 02/06/2013); Knee arthroscopy (Right, 10/02/2013); Knee arthroscopy with medial menisectomy (Right, 03/25/2015); Knee bursectomy (Right, 03/25/2015); Cystoscopy w/ retrogrades (Right, 11/19/2015); Cystoscopy w/ retrogrades (Right, 10/03/2017); Ureteroscopy (Right, 10/03/2017); and CTR (Left, 02/27/2018). Mr. Hiemstra has a current medication list which includes the following prescription(s): cyanocobalamin, gabapentin, insulin degludec, ketoconazole, metformin, naproxen sodium, novolog flexpen, oxycodone, phentermine, potassium citrate, ramipril, rosuvastatin, semaglutide(0.25 or 0.5mg /dos), and testosterone. He  reports that he has never smoked. He has never used smokeless tobacco. He reports current alcohol use. He reports that he does not use drugs. Adam Petty has No Known Allergies.   HPI  Today, he is being contacted for medication management.  Pharmacotherapy Assessment  Analgesic: Oxycodone IR 5 mg, 1 tab PO q 6 hrs (20 mg/day)  MME/day: 30 mg/day.   Monitoring: Pharmacotherapy: No side-effects or adverse reactions reported. [place] PMP: PDMP reviewed during this encounter.       Compliance: No problems identified. Effectiveness: Clinically acceptable. Plan: Refer to "POC".  UDS:  Summary  Date Value Ref Range Status  12/07/2017 FINAL  Final    Comment:    ==================================================================== TOXASSURE SELECT 13 (MW) ==================================================================== Test                             Result       Flag       Units Drug Present and Declared for Prescription Verification   Oxycodone  977          EXPECTED   ng/mg creat   Oxymorphone                    917          EXPECTED   ng/mg creat   Noroxycodone                   1160         EXPECTED   ng/mg creat   Noroxymorphone                 114          EXPECTED   ng/mg creat    Sources of oxycodone are scheduled prescription medications.    Oxymorphone, noroxycodone, and noroxymorphone are expected    metabolites of oxycodone. Oxymorphone is also available as a    scheduled prescription medication. ==================================================================== Test                      Result    Flag   Units      Ref Range   Creatinine              70               mg/dL      >=82>=20 ==================================================================== Declared Medications:  The flagging and interpretation on this report are based on the  following declared medications.  Unexpected results may arise from  inaccuracies in the declared medications.  **Note: The testing scope of this panel includes these medications:  Oxycodone  **Note: The testing scope of this panel does not include following  reported medications:  Calcium  Cyanocobalamin  Gabapentin  Ibuprofen  Insulin  Ketoconazole  Liraglutide (Victoza)  Metformin  Naproxen  Potassium  Ramipril  Rosuvastatin ==================================================================== For clinical consultation, please call [phone]. ====================================================================    Laboratory Chemistry Profile (12 mo)  Renal: 10/02/2017: BUN 12; Creatinine, Ser 1.06  Lab Results  Component Value Date   GFRAA >60 10/02/2017   GFRNONAA >60 10/02/2017   Hepatic: No results found for requested labs within last 8760 hours. Lab Results  Component Value Date   AST 41 03/12/2015   ALT 55 03/12/2015   Other: No results found for requested labs within last 8760 hours. Note: Above Lab results reviewed.  Imaging  Last 90 days:   No results found. Last Hospital Admission:  Dg Hand 2 View Left  Result Date: 11/29/2017 CLINICAL DATA:  Hand pain for several months, no known injury, initial encounter EXAM: LEFT HAND - 2 VIEW COMPARISON:  None. FINDINGS: There is no evidence of fracture or dislocation. There is no evidence of arthropathy or other focal bone abnormality. Soft tissues are unremarkable. IMPRESSION: No acute abnormality noted. Electronically Signed   By: Alcide CleverMark  Lukens M.D.   On: 11/29/2017 11:35   Assessment  The primary encounter diagnosis was Chronic pain syndrome (WC injury). Diagnoses of Chronic low back pain (WC injury) (Primary Area of Pain) (Bilateral) (R>L), Chronic knee pain (Secondary area of Pain) (Right), Chronic shoulder pain (Third area of Pain) (Right), Chronic pain syndrome, and Neurogenic pain were also pertinent to this visit.  Plan of Care  I have discontinued Steele BergFranklin W. Busbee Jr. "Frank"'s liraglutide, ibuprofen, and Tapentadol HCl (NUCYNTA PO). I am also having him maintain his rosuvastatin, NovoLOG FlexPen, cyanocobalamin, potassium citrate, metFORMIN, insulin degludec, ketoconazole, naproxen sodium, ramipril, Testosterone, phentermine, Semaglutide(0.25 or 0.5MG /DOS), gabapentin, and oxyCODONE.  Pharmacotherapy (Medications Ordered): No orders of the defined types were placed in this encounter.  Orders:  No orders of the defined types were placed in this encounter.  Follow-up plan:   No follow-ups on file.      Interventional management options: Planned, scheduled, and/or pending:   Possible repeat MRI.  The last one was done on 2016.  Since then he has been experiencing more right lower extremity pain.  He compares this to the type of pain that he had prior to the surgery of his lumbar spine.  He is also experiencing pain in the center of the lower back, possibly discogenic in nature.  Depending on the results of the MRI (which we would order with and without contrast) then we may  need to consider a diagnostic discogram with the possibility of following up with [facility].   Considering:   Palliativebilateral lumbar facet block Palliativebilateral lumbar facet RFA (Right side done 04/13/2016) (100% relief) Diagnostic right knee genicular nerve block.  Possible right knee genicular nerve RFA   Palliative PRN treatment(s):   Diagnostic right Suprascapular nerve Block#2  Possible right Suprascapular nerveRFA    Recent Visits No visits were found meeting these conditions.  Showing recent visits within past 90 days and meeting all other requirements   Today's Visits Date Type Provider Dept  09/03/18 Appointment Delano MetzNaveira, Jameriah Trotti, MD [facility]  Showing today's visits and meeting all other requirements   Future Appointments No visits were found meeting these conditions.  Showing future appointments within next 90 days and meeting all other requirements   I discussed the assessment and treatment plan with the patient. The patient was provided an opportunity to ask questions and all were answered. The patient agreed with the plan and demonstrated an understanding of the instructions.  Patient advised to call back or seek an in-person evaluation if the symptoms or condition worsens.  Total duration of non-face-to-face encounter: 12 minutes.  Note by: Oswaldo DoneFrancisco A Jashae Wiggs, MD Date: 09/03/2018; Time: 8:31 AM  Note: This dictation was prepared with Dragon dictation. Any transcriptional errors that may result from this process are unintentional.  Disclaimer:  * Given the special circumstances of the COVID-19 pandemic, the federal government has announced that the Office for Civil Rights (OCR) will exercise its enforcement discretion and will not impose penalties on physicians using telehealth in the event of noncompliance with regulatory requirements under the [employer] Insurance Portability and Accountability Act (HIPAA) in connection with the good faith  provision of telehealth during the COVID-19 national public health emergency. (AMA)

## 2018-09-03 ENCOUNTER — Ambulatory Visit: Payer: Self-pay | Attending: Pain Medicine | Admitting: Pain Medicine

## 2018-09-03 ENCOUNTER — Other Ambulatory Visit: Payer: Self-pay

## 2018-09-03 DIAGNOSIS — M792 Neuralgia and neuritis, unspecified: Secondary | ICD-10-CM

## 2018-09-03 DIAGNOSIS — M25511 Pain in right shoulder: Secondary | ICD-10-CM

## 2018-09-03 DIAGNOSIS — G8929 Other chronic pain: Secondary | ICD-10-CM

## 2018-09-03 DIAGNOSIS — M545 Low back pain, unspecified: Secondary | ICD-10-CM

## 2018-09-03 DIAGNOSIS — M25561 Pain in right knee: Secondary | ICD-10-CM

## 2018-09-03 DIAGNOSIS — G894 Chronic pain syndrome: Secondary | ICD-10-CM

## 2018-09-03 MED ORDER — GABAPENTIN 600 MG PO TABS: 600.0000 mg | ORAL_TABLET | Freq: Four times a day (QID) | ORAL | 2 refills | Status: DC

## 2018-09-03 MED ORDER — OXYCODONE HCL 5 MG PO TABS: 5.0000 mg | ORAL_TABLET | Freq: Four times a day (QID) | ORAL | 0 refills | Status: DC | PRN

## 2018-11-16 ENCOUNTER — Other Ambulatory Visit: Payer: Self-pay | Admitting: Family Medicine

## 2018-11-16 DIAGNOSIS — R768 Other specified abnormal immunological findings in serum: Secondary | ICD-10-CM

## 2018-11-19 IMAGING — CR DG HAND 2V*L*
2 series · 2 of 2 positions shown · non-contrast
Comparison: None.

CLINICAL DATA: Hand pain for several months, no known injury,
initial encounter

EXAM:
LEFT HAND - 2 VIEW

[hand ap]
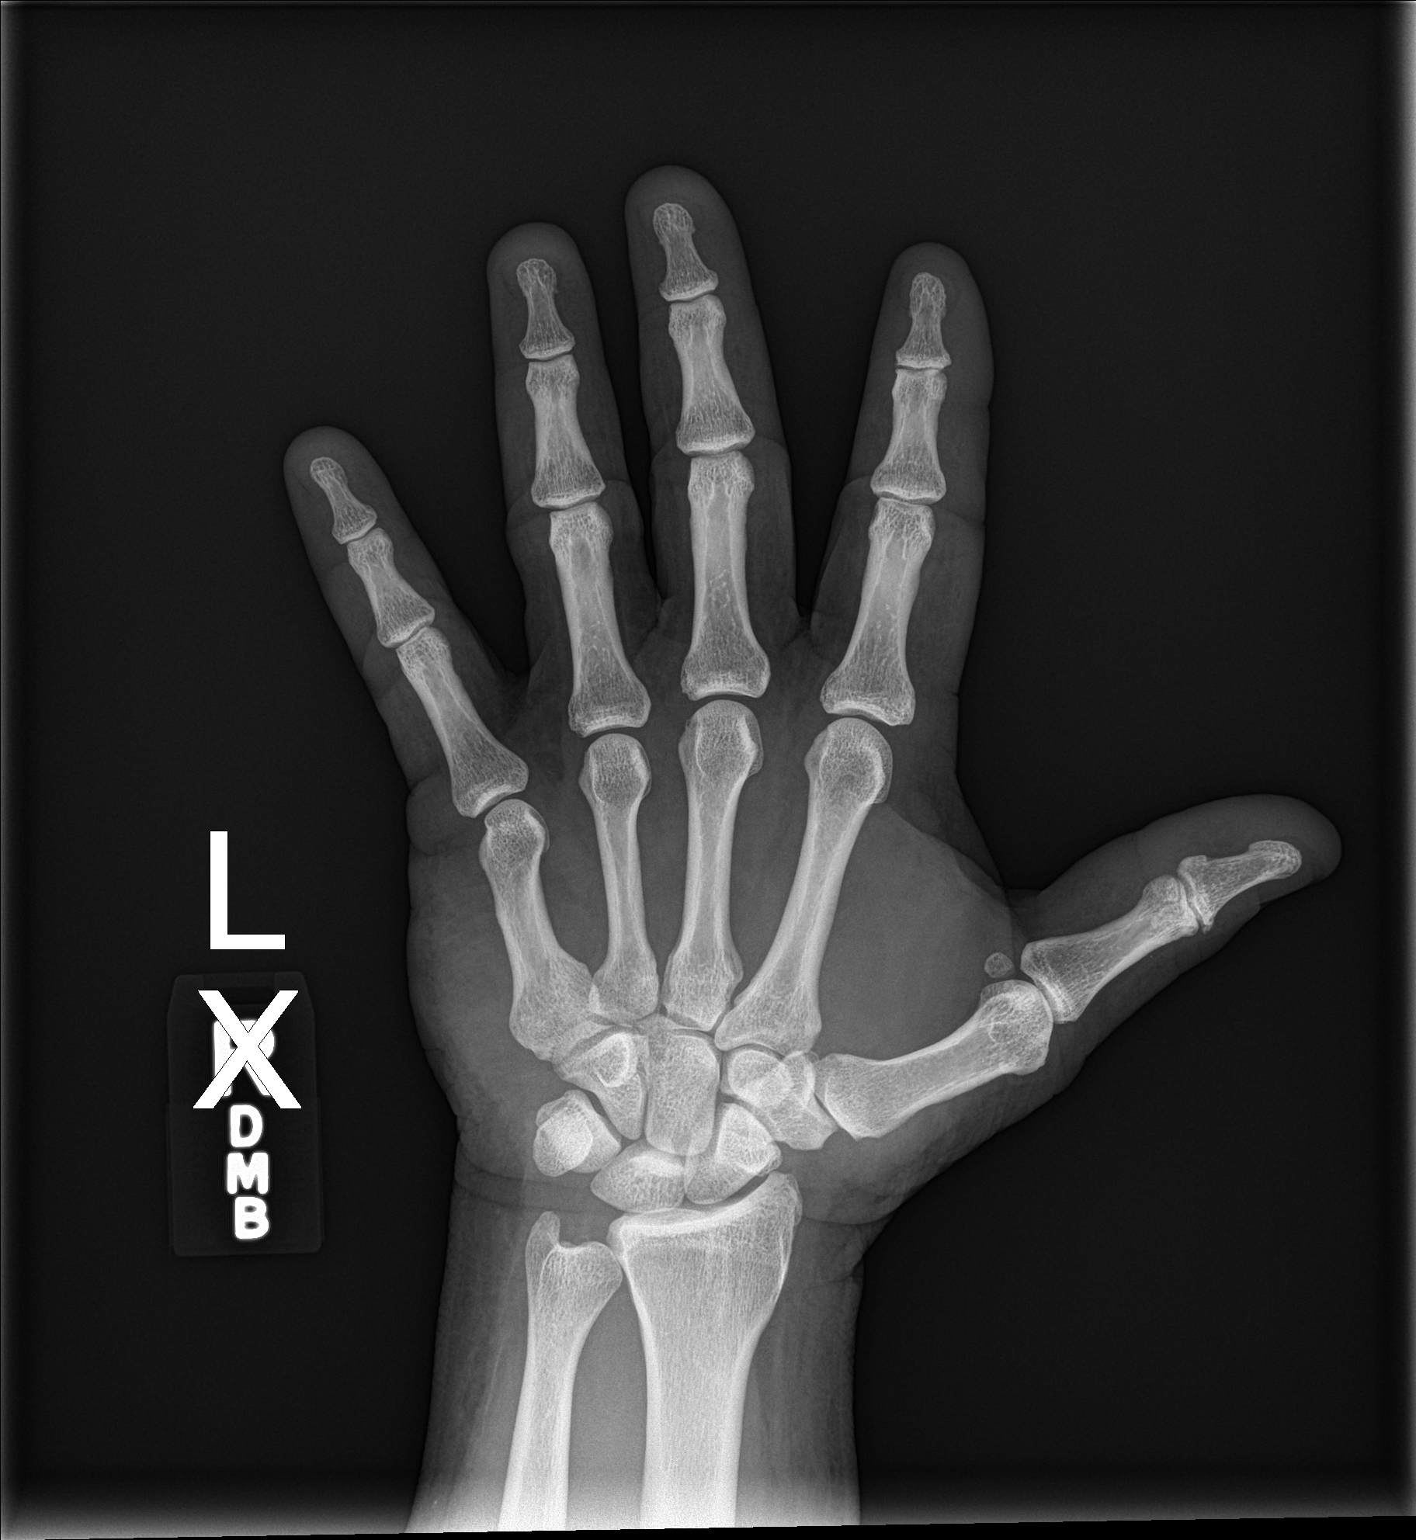

[hand lat]
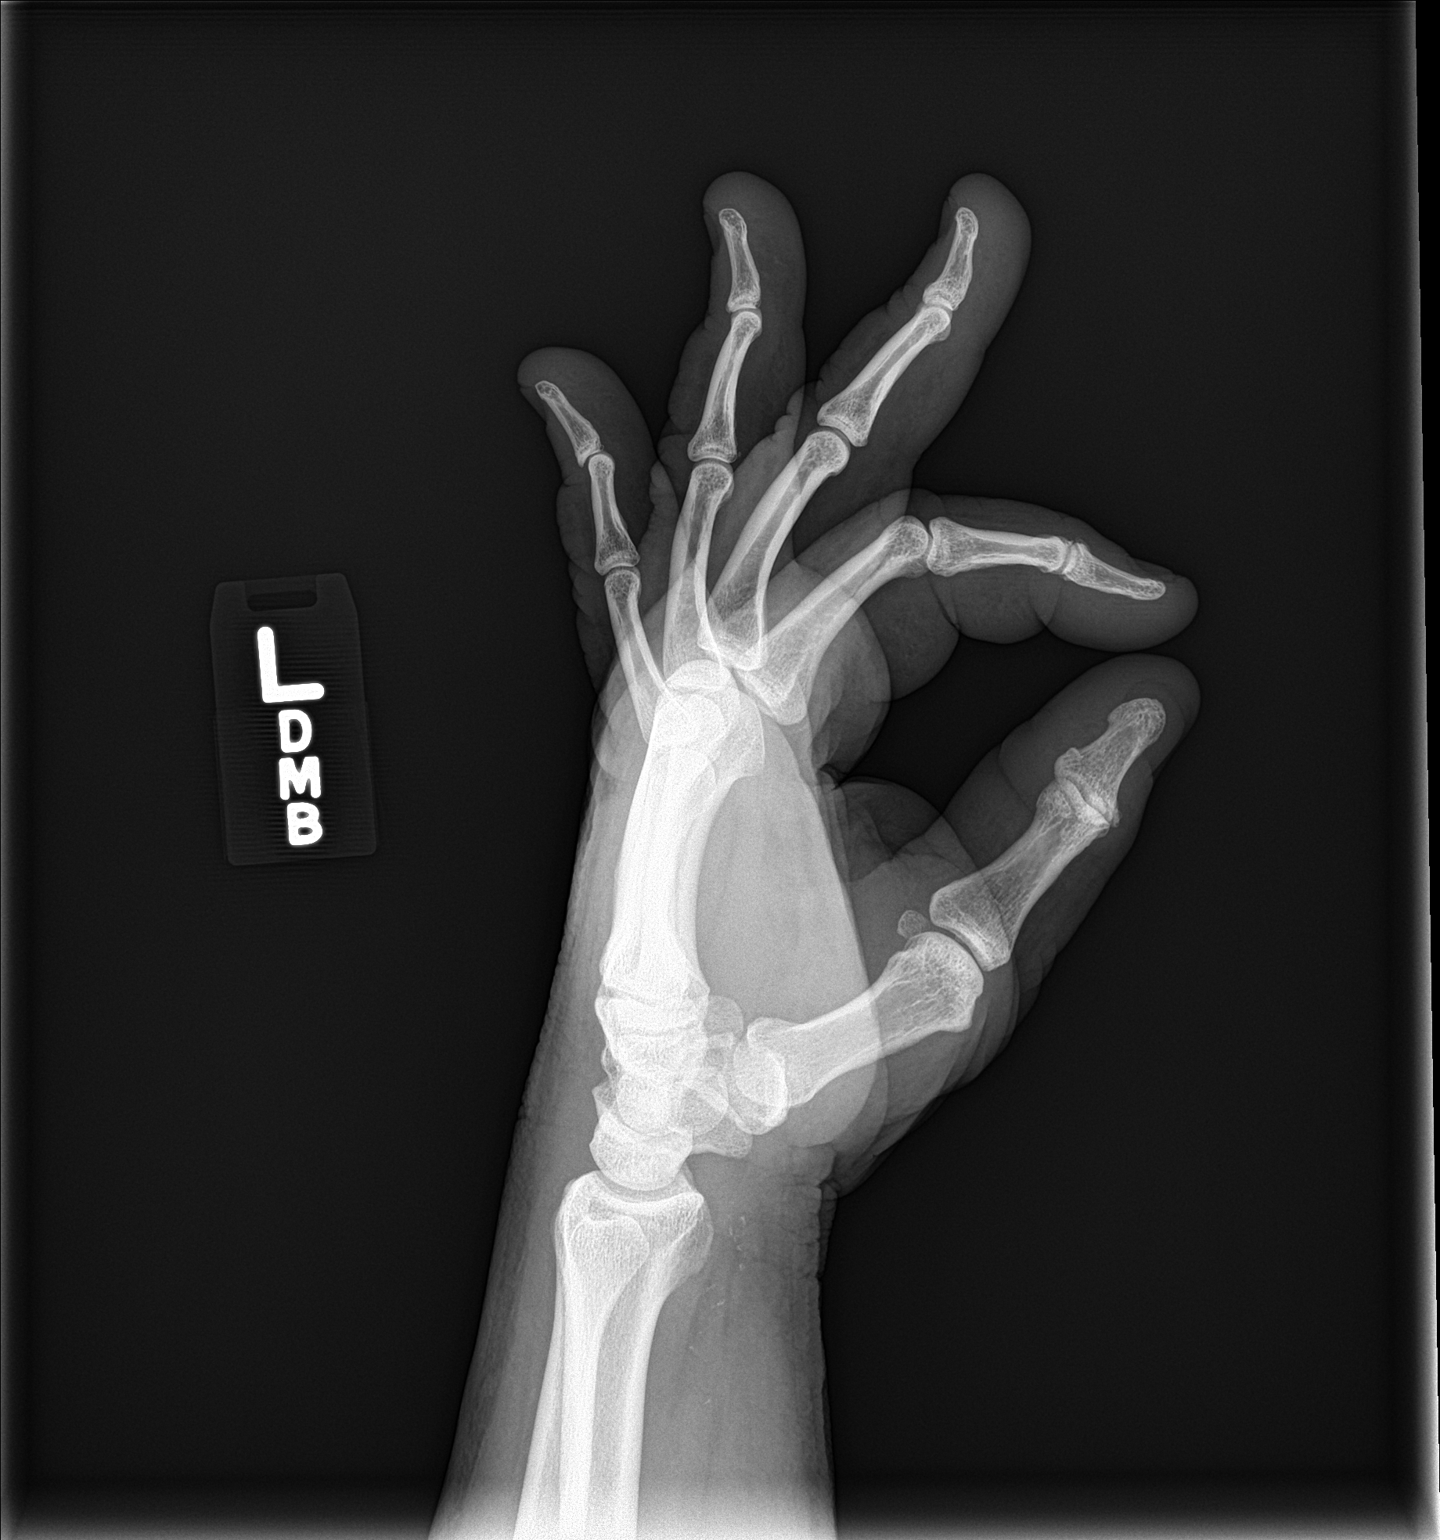

[2 of 2 positions shown; findings below may reference images not displayed]

FINDINGS: There is no evidence of fracture or dislocation. There is no
evidence of arthropathy or other focal bone abnormality. Soft
tissues are unremarkable.
IMPRESSION: No acute abnormality noted.

## 2018-11-20 DIAGNOSIS — R768 Other specified abnormal immunological findings in serum: Secondary | ICD-10-CM | POA: Insufficient documentation

## 2018-11-29 ENCOUNTER — Encounter: Payer: Self-pay | Admitting: Pain Medicine

## 2018-12-02 NOTE — Progress Notes (Signed)
Pain Management Virtual Encounter Note - Virtual Visit via Telephone Telehealth (real-time audio visits between healthcare provider and patient).   Patient's Phone No. & Preferred Pharmacy:  [phone] (home); [phone] (mobile); (Preferred) [phone] [email or web address]  [facility][id number] Adam Petty, [place] - [address] AT [facility] OF [address] [place] [address] Phone: [phone] Fax: [phone]    Pre-screening note:  Our staff contacted Adam Petty and offered him an "in person", "face-to-face" appointment versus a telephone encounter. He indicated preferring the telephone encounter, at this time.   Reason for Virtual Visit: COVID-19*  Social distancing based on CDC and AMA recommendations.   I contacted Adam Berg. on 12/03/2018 via telephone.      I clearly identified myself as Adam Done, MD. I verified that I was speaking with the correct person using two identifiers (Name: Adam Demeyer., and date of birth: 1960-02-19).  Advanced Informed Consent I sought verbal advanced consent from Adam Berg. for virtual visit interactions. I informed Adam Petty of possible security and privacy concerns, risks, and limitations associated with providing "not-in-person" medical evaluation and management services. I also informed Adam Petty of the availability of "in-person" appointments. Finally, I informed him that there would be a charge for the virtual visit and that he could be  personally, fully or partially, financially responsible for it. Mr. Justen expressed understanding and agreed to proceed.   Historic Elements   [name] is a 58 y.o. year old, male patient evaluated today after his last encounter by our practice on 09/03/2018. 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 He also  has a past surgical history that includes Tonsillectomy; Shoulder surgery; Palate surgery; Cervical fusion; Foot surgery; Lumbar laminectomy/decompression microdiscectomy (N/A, 02/06/2013); Knee arthroscopy (Right, 10/02/2013); Knee arthroscopy with medial menisectomy (Right, 03/25/2015); Knee bursectomy (Right, 03/25/2015); Cystoscopy w/ retrogrades (Right, 11/19/2015); Cystoscopy w/ retrogrades (Right, 10/03/2017); Ureteroscopy (Right, 10/03/2017); and CTR (Left, 02/27/2018). Adam Petty has a current medication list which includes the following prescription(s): cyanocobalamin, gabapentin, insulin degludec, ketoconazole, metformin, naproxen sodium, novolog flexpen, oxycodone, oxycodone, oxycodone, phentermine, potassium citrate, ramipril, rosuvastatin, semaglutide(0.25 or 0.5mg /dos), tapentadol hcl, and testosterone. He  reports that he has never smoked. He has never used smokeless tobacco. He reports current alcohol use. He reports that he does not use drugs. Adam Petty has No Known Allergies.   HPI  Today, he is being contacted for medication management.  The patient indicates doing well with the current medication regimen. No adverse reactions or side effects reported to the medications.   Pharmacotherapy Assessment  Analgesic: Oxycodone IR 5 mg, 1 tab PO q 6 hrs (20 mg/day)  MME/day: 30 mg/day.   Monitoring: Pharmacotherapy: No side-effects or adverse reactions reported. [place] PMP: PDMP reviewed during this encounter.       Compliance: No problems identified. Effectiveness: Clinically acceptable. Plan: Refer to "POC".  UDS:  Summary  Date Value Ref Range Status  12/07/2017 FINAL  Final    Comment:    ==================================================================== TOXASSURE SELECT 13  (MW) ==================================================================== Test                             Result  Flag       Units Drug Present and Declared for Prescription Verification   Oxycodone                      977          EXPECTED   ng/mg creat   Oxymorphone                    917          EXPECTED   ng/mg creat   Noroxycodone                   1160         EXPECTED   ng/mg creat   Noroxymorphone                 114          EXPECTED   ng/mg creat    Sources of oxycodone are scheduled prescription medications.    Oxymorphone, noroxycodone, and noroxymorphone are expected    metabolites of oxycodone. Oxymorphone is also available as a    scheduled prescription medication. ==================================================================== Test                      Result    Flag   Units      Ref Range   Creatinine              70               mg/dL      >=09>=20 ==================================================================== Declared Medications:  The flagging and interpretation on this report are based on the  following declared medications.  Unexpected results may arise from  inaccuracies in the declared medications.  **Note: The testing scope of this panel includes these medications:  Oxycodone  **Note: The testing scope of this panel does not include following  reported medications:  Calcium  Cyanocobalamin  Gabapentin  Ibuprofen  Insulin  Ketoconazole  Liraglutide (Victoza)  Metformin  Naproxen  Potassium  Ramipril  Rosuvastatin ==================================================================== For clinical consultation, please call [phone]. ====================================================================    Laboratory Chemistry Profile (12 mo)  Renal: No results found for requested labs within last 8760 hours.  Lab Results  Component Value Date   GFRAA >60 10/02/2017   GFRNONAA >60 10/02/2017   Hepatic: No results found for  requested labs within last 8760 hours. Lab Results  Component Value Date   AST 41 03/12/2015   ALT 55 03/12/2015   Other: No results found for requested labs within last 8760 hours. Note: Above Lab results reviewed.  Imaging  Last 90 days:  No results found.  Assessment  The primary encounter diagnosis was Chronic pain syndrome (WC injury). Diagnoses of Chronic low back pain (WC injury) (Primary Area of Pain) (Bilateral) (R>L), Chronic knee pain (Secondary area of Pain) (Right), Chronic shoulder pain (Third area of Pain) (Right), Chronic pain syndrome, and Neurogenic pain were also pertinent to this visit.  Plan of Care  I am having Adam BergFranklin W. Emel Jr. "Homero FellersFrank" start on oxyCODONE and oxyCODONE. I am also having him maintain his rosuvastatin, NovoLOG FlexPen, cyanocobalamin, potassium citrate, metFORMIN, insulin degludec, ketoconazole, naproxen sodium, ramipril, Testosterone, phentermine, Semaglutide(0.25 or 0.5MG /DOS), Tapentadol HCl (NUCYNTA PO), oxyCODONE, and gabapentin.  Pharmacotherapy (Medications Ordered): Meds ordered this encounter  Medications  . oxyCODONE (OXY IR/ROXICODONE) 5 MG immediate release tablet    Sig: Take 1 tablet (5 mg total) by mouth every 6 (  six) hours as needed for severe pain. Must last 30 days    Dispense:  120 tablet    Refill:  0    Chronic Pain: STOP Act (Not applicable) Fill 1 day early if closed on refill date. Do not fill until: 01/01/2019. To last until: 01/31/2019. Avoid benzodiazepines within 8 hours of opioids  . gabapentin (NEURONTIN) 600 MG tablet    Sig: Take 1 tablet (600 mg total) by mouth every 6 (six) hours.    Dispense:  120 tablet    Refill:  5    Fill one day early if pharmacy is closed on scheduled refill date. May substitute for generic if available.  Marland Kitchen oxyCODONE (OXY IR/ROXICODONE) 5 MG immediate release tablet    Sig: Take 1 tablet (5 mg total) by mouth every 6 (six) hours as needed for severe pain. Must last 30 days     Dispense:  120 tablet    Refill:  0    Chronic Pain: STOP Act (Not applicable) Fill 1 day early if closed on refill date. Do not fill until: 01/31/2019. To last until: 03/02/2019. Avoid benzodiazepines within 8 hours of opioids  . oxyCODONE (OXY IR/ROXICODONE) 5 MG immediate release tablet    Sig: Take 1 tablet (5 mg total) by mouth every 6 (six) hours as needed for severe pain. Must last 30 days    Dispense:  120 tablet    Refill:  0    Chronic Pain: STOP Act (Not applicable) Fill 1 day early if closed on refill date. Do not fill until: 03/02/2019. To last until: 04/01/2019. Avoid benzodiazepines within 8 hours of opioids   Orders:  No orders of the defined types were placed in this encounter.  Follow-up plan:   Return in about 17 weeks (around 04/01/2019) for (VV), (MM).      Interventional management options: Planned, scheduled, and/or pending:   Possible repeat MRI.  The last one was Petty on 2016.  Since then he has been experiencing more right lower extremity pain.  He compares this to the type of pain that he had prior to the surgery of his lumbar spine.  He is also experiencing pain in the center of the lower back, possibly discogenic in nature.  Depending on the results of the MRI (which we would order with and without contrast) then we may need to consider a diagnostic discogram with the possibility of following up with IDET.   Considering:   Palliativebilateral lumbar facet block Palliativebilateral lumbar facet RFA (Right side Petty 04/13/2016) (100% relief) Diagnostic right knee genicular nerve block.  Possible right knee genicular nerve RFA   Palliative PRN treatment(s):   Diagnostic right Suprascapular nerve Block#2  Possible right Suprascapular nerveRFA     Recent Visits No visits were found meeting these conditions.  Showing recent visits within past 90 days and meeting all other requirements   Today's Visits Date Type Provider Dept  12/03/18 Telemedicine Delano Metz, MD [facility]  Showing today's visits and meeting all other requirements   Future Appointments No visits were found meeting these conditions.  Showing future appointments within next 90 days and meeting all other requirements   I discussed the assessment and treatment plan with the patient. The patient was provided an opportunity to ask questions and all were answered. The patient agreed with the plan and demonstrated an understanding of the instructions.  Patient advised to call back or seek an in-person evaluation if the symptoms or condition worsens.  Total  duration of non-face-to-face encounter: 13 minutes.  Note by: Gaspar Cola, MD Date: 12/03/2018; Time: 4:10 PM  Note: This dictation was prepared with Dragon dictation. Any transcriptional errors that may result from this process are unintentional.  Disclaimer:  * Given the special circumstances of the COVID-19 pandemic, the federal government has announced that the Office for Civil Rights (OCR) will exercise its enforcement discretion and will not impose penalties on physicians using telehealth in the event of noncompliance with regulatory requirements under the [place] and [place] (HIPAA) in connection with the good faith provision of telehealth during the PPIRJ-18 national public health emergency. ([place])

## 2018-12-03 ENCOUNTER — Other Ambulatory Visit: Payer: Self-pay

## 2018-12-03 ENCOUNTER — Ambulatory Visit: Payer: BC Managed Care – PPO | Attending: Pain Medicine | Admitting: Pain Medicine

## 2018-12-03 DIAGNOSIS — M25511 Pain in right shoulder: Secondary | ICD-10-CM

## 2018-12-03 DIAGNOSIS — M545 Low back pain: Secondary | ICD-10-CM

## 2018-12-03 DIAGNOSIS — M792 Neuralgia and neuritis, unspecified: Secondary | ICD-10-CM

## 2018-12-03 DIAGNOSIS — M25561 Pain in right knee: Secondary | ICD-10-CM | POA: Diagnosis not present

## 2018-12-03 DIAGNOSIS — G894 Chronic pain syndrome: Secondary | ICD-10-CM | POA: Diagnosis not present

## 2018-12-03 DIAGNOSIS — G8929 Other chronic pain: Secondary | ICD-10-CM

## 2018-12-03 MED ORDER — OXYCODONE HCL 5 MG PO TABS: 5.0000 mg | ORAL_TABLET | Freq: Four times a day (QID) | ORAL | 0 refills | Status: DC | PRN

## 2018-12-03 MED ORDER — GABAPENTIN 600 MG PO TABS: 600.0000 mg | ORAL_TABLET | Freq: Four times a day (QID) | ORAL | 5 refills | Status: DC

## 2018-12-24 ENCOUNTER — Inpatient Hospital Stay: Admit: 2018-12-24 | Payer: BC Managed Care – PPO | Admitting: Orthopedic Surgery

## 2018-12-24 SURGERY — ARTHROPLASTY, KNEE, TOTAL
Anesthesia: Choice | Site: Knee | Laterality: Right

## 2018-12-26 ENCOUNTER — Telehealth: Payer: Self-pay | Admitting: Pain Medicine

## 2019-03-20 ENCOUNTER — Telehealth: Payer: Self-pay

## 2019-03-20 NOTE — Telephone Encounter (Signed)
PT RETURNED THE CALL. PLEASE GIVE HIM A CALL.                                                           THANKS

## 2019-03-20 NOTE — Telephone Encounter (Signed)
LM for patient to call back for pre virtual appointment questions.  °

## 2019-03-21 NOTE — Progress Notes (Signed)
Patient: Adam Petty.  Service Category: E/M  Provider: Gaspar Cola, MD  DOB: [birth year]  DOS: 03/25/2019  Location: Office  MRN: [id number]  Setting: Ambulatory outpatient  Referring Provider: Zeb Comfort, MD  Type: Established Patient  Specialty: Interventional Pain Management  PCP: Zeb Comfort, MD  Location: Remote location  Delivery: TeleHealth     Virtual Encounter - Pain Management PROVIDER NOTE: Information contained herein reflects review and annotations entered in association with encounter. Interpretation of such information and data should be left to medically-trained personnel. Information provided to patient can be located elsewhere in the medical record under "Patient Instructions". Document created using STT-dictation technology, any transcriptional errors that may result from process are unintentional.    Contact & Pharmacy Preferred: [phone] Home: [phone] (home) Mobile: [phone] (mobile) E-mail: [email or web address]  [facility] [place], [place] AT [place] [address] Phone: [phone] Fax: [phone]   Pre-screening  Adam Petty offered "in-person" vs "virtual" encounter. He indicated preferring virtual for this encounter.   Reason COVID-19*  Social distancing based on CDC and AMA recommendations.   I contacted Adam Petty. on 03/25/2019 via telephone.      I clearly identified myself as Gaspar Cola, MD. I verified that I was speaking with the correct person using two identifiers (Name: Adam Petty., and date of birth: 1960/01/21).  Consent I sought verbal advanced consent from Adam Petty. for virtual visit interactions. I informed Adam Petty of possible security and privacy concerns, risks, and limitations associated with providing "not-in-person" medical evaluation and management services. I also informed Adam Petty of the availability of "in-person" appointments. Finally, I informed him that there would be a charge for the virtual visit and that he could be  personally, fully or partially, financially responsible for it. Adam Petty expressed understanding and agreed to proceed.   Historic Elements   Adam Petty. is a 59 y.o. year old, male patient evaluated today after his last contact with our practice on 03/20/2019. 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 He also  has a past surgical history that includes Tonsillectomy; Shoulder surgery; Palate surgery; Cervical fusion; Foot surgery; Lumbar laminectomy/decompression microdiscectomy (N/A, 02/06/2013); Knee arthroscopy (Right, 10/02/2013); Knee arthroscopy with medial menisectomy (Right, 03/25/2015); Knee bursectomy (Right, 03/25/2015); Cystoscopy w/ retrogrades (Right, 11/19/2015); Cystoscopy w/ retrogrades (Right, 10/03/2017); Ureteroscopy (Right, 10/03/2017); and CTR (Left, 02/27/2018). Adam Petty has a current medication list which includes the following prescription(s): cyanocobalamin, gabapentin, insulin degludec, ketoconazole, metformin, naproxen sodium, novolog flexpen, [START ON 04/01/2019] oxycodone, [START ON 05/01/2019] oxycodone, [START ON 05/31/2019] oxycodone, potassium citrate, ramipril, rosuvastatin, semaglutide(0.25 or 0.20m/dos), tapentadol hcl, and testosterone. He  reports that he has never smoked. He has never used smokeless tobacco. He reports current alcohol use. He reports that he does not use drugs. Adam Petty No Known Allergies.   HPI  Today, he is being contacted for medication management. The patient indicates doing well with the current medication regimen. No  adverse reactions or side effects reported to the medications.  Unfortunately, the patient indicates that he is having a flareup of his low back pain and right lower extremity pain  where the pain is running through the back of his right leg down to his knee.  He also indicates that this pain comes and goes but whenever is present, he cannot even walk.  He says that he may have some numbness going down into his foot but he cannot tell on which portion of the foot the numbness is located since it seems to cover the entire foot.  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  I will be bringing him in for a diagnostic/palliative right-sided lumbar facet block under fluoroscopic guidance and IV sedation.  Should he get good benefit, but the pain continues to return, then we will consider radiofrequency ablation.  The last time that he had that radiofrequency done was on 04/13/2016 and it provided him with 100% relief of the pain that would appear to have lasted until now.  Pharmacotherapy Assessment  Analgesic: Oxycodone IR 5 mg, 1 tab PO q 6 hrs (20 mg/day)  MME/day: 30 mg/day.   Monitoring: [place] PMP: PDMP reviewed during this encounter.       Pharmacotherapy: No side-effects or adverse reactions reported. Compliance: No problems identified. Effectiveness: Clinically acceptable. Plan: Refer to "POC".  UDS:  Summary  Date Value Ref Range Status  12/07/2017 FINAL  Final    Comment:    ==================================================================== TOXASSURE SELECT 13 (MW) ==================================================================== Test                             Result       Flag       Units Drug Present and Declared for Prescription Verification   Oxycodone                      977          EXPECTED   ng/mg creat   Oxymorphone                    917          EXPECTED   ng/mg creat   Noroxycodone                    1160         EXPECTED   ng/mg creat   Noroxymorphone                 114          EXPECTED   ng/mg creat    Sources of oxycodone are scheduled prescription medications.    Oxymorphone, noroxycodone, and noroxymorphone are expected    metabolites of oxycodone. Oxymorphone is also available as a    scheduled prescription medication. ==================================================================== Test                      Result    Flag   Units      Ref Range   Creatinine              70               mg/dL      >=20 ==================================================================== Declared Medications:  The flagging and interpretation on this report are based on the  following declared medications.  Unexpected results may arise from  inaccuracies in the declared medications.  **Note: The testing scope of this panel includes these medications:  Oxycodone  **Note:  The testing scope of this panel does not include following  reported medications:  Calcium  Cyanocobalamin  Gabapentin  Ibuprofen  Insulin  Ketoconazole  Liraglutide (Victoza)  Metformin  Naproxen  Potassium  Ramipril  Rosuvastatin ==================================================================== For clinical consultation, please call [phone]. ====================================================================    Laboratory Chemistry Profile   Renal Lab Results  Component Value Date   BUN 12 10/02/2017   CREATININE 1.06 10/02/2017   GFRAA >60 10/02/2017   GFRNONAA >60 10/02/2017    Hepatic Lab Results  Component Value Date   AST 41 03/12/2015   ALT 55 03/12/2015   ALBUMIN 4.2 03/12/2015   ALKPHOS 89 03/12/2015    Electrolytes Lab Results  Component Value Date   NA 137 10/02/2017   K 4.2 10/02/2017   CL 103 10/02/2017   CALCIUM 8.8 (L) 10/02/2017   MG 1.8 03/12/2015    Bone No results found for: VD25OH, VD125OH2TOT, CV8938BO1, BP1025EN2, 25OHVITD1, 25OHVITD2, 25OHVITD3, TESTOFREE,  TESTOSTERONE  Inflammation (CRP: Acute Phase) (ESR: Chronic Phase) Lab Results  Component Value Date   CRP 0.6 03/12/2015   ESRSEDRATE 9 03/12/2015      Note: Above Lab results reviewed.  Imaging  DG Hand 2 View Left CLINICAL DATA:  Hand pain for several months, no known injury, initial encounter  EXAM: LEFT HAND - 2 VIEW  COMPARISON:  None.  FINDINGS: There is no evidence of fracture or dislocation. There is no evidence of arthropathy or other focal bone abnormality. Soft tissues are unremarkable.  IMPRESSION: No acute abnormality noted.  Electronically Signed   By: Inez Catalina M.D.   On: 11/29/2017 11:35  Assessment  The primary encounter diagnosis was Lumbar facet syndrome (WC injury) (Bilateral) (R>L). Diagnoses of Chronic low back pain (WC injury) (Primary Area of Pain) (Bilateral) (R>L), Chronic knee pain (Secondary area of Pain) (Right), and Chronic pain syndrome were also pertinent to this visit.  Plan of Care  Problem-specific:  No problem-specific Assessment & Plan notes found for this encounter.  Adam Petty. has a current medication list which includes the following long-term medication(s): gabapentin, [START ON 04/01/2019] oxycodone, [START ON 05/01/2019] oxycodone, [START ON 05/31/2019] oxycodone, and testosterone.  Pharmacotherapy (Medications Ordered): Meds ordered this encounter  Medications  . oxyCODONE (OXY IR/ROXICODONE) 5 MG immediate release tablet    Sig: Take 1 tablet (5 mg total) by mouth every 6 (six) hours as needed for severe pain. Must last 30 days    Dispense:  120 tablet    Refill:  0    Chronic Pain: STOP Act (Not applicable) Fill 1 day early if closed on refill date. Do not fill until: 04/01/2019. To last until: 05/01/2019. Avoid benzodiazepines within 8 hours of opioids  . oxyCODONE (OXY IR/ROXICODONE) 5 MG immediate release tablet    Sig: Take 1 tablet (5 mg total) by mouth every 6 (six) hours as needed for severe pain.  Must last 30 days    Dispense:  120 tablet    Refill:  0    Chronic Pain: STOP Act (Not applicable) Fill 1 day early if closed on refill date. Do not fill until: 05/01/2019. To last until: 05/31/2019. Avoid benzodiazepines within 8 hours of opioids  . oxyCODONE (OXY IR/ROXICODONE) 5 MG immediate release tablet    Sig: Take 1 tablet (5 mg total) by mouth every 6 (six) hours as needed for severe pain. Must last 30 days    Dispense:  120 tablet    Refill:  0  Chronic Pain: STOP Act (Not applicable) Fill 1 day early if closed on refill date. Do not fill until: 05/31/2019. To last until: 06/30/2019. Avoid benzodiazepines within 8 hours of opioids   Orders:  Orders Placed This Encounter  Procedures  . LUMBAR FACET(MEDIAL BRANCH NERVE BLOCK) MBNB    Standing Status:   Future    Standing Expiration Date:   04/25/2019    Scheduling Instructions:     Procedure: Lumbar facet block (AKA.: Lumbosacral medial branch nerve block)     Side: Right-sided     Level: L3-4, L4-5, & L5-S1 Facets (L2, L3, L4, L5, & S1 Medial Branch Nerves)     Sedation: With Sedation.     Timeframe: ASAP    Order Specific Question:   Where will this procedure be performed?    Answer:   [facility]   Follow-up plan:   Return in about 3 months (around 06/26/2019) for (VV), (MM), in addition, Procedure (w/ sedation): (R) L-FCT BLK, (ASAP).      Interventional management options: Planned, scheduled, and/or pending:   Possible repeat MRI.  The last one was done on 2016.  Since then he has been experiencing more right lower extremity pain.  He compares this to the type of pain that he had prior to the surgery of his lumbar spine.  He is also experiencing pain in the center of the lower back, possibly discogenic in nature.  Depending on the results of the MRI (which we would order with and without contrast) then we may need to consider a diagnostic discogram with the possibility of following up with IDET.   Considering:    Palliativebilateral lumbar facet block Palliativebilateral lumbar facet RFA (Right side done 04/13/2016) (100% relief) Diagnostic right knee genicular nerve block.  Possible right knee genicular nerve RFA   Palliative PRN treatment(s):   Diagnostic right Suprascapular nerve Block#2  Possible right Suprascapular nerveRFA    Recent Visits No visits were found meeting these conditions.  Showing recent visits within past 90 days and meeting all other requirements   Today's Visits Date Type Provider Dept  03/25/19 Telemedicine Milinda Pointer, MD [facility]  Showing today's visits and meeting all other requirements   Future Appointments No visits were found meeting these conditions.  Showing future appointments within next 90 days and meeting all other requirements   I discussed the assessment and treatment plan with the patient. The patient was provided an opportunity to ask questions and all were answered. The patient agreed with the plan and demonstrated an understanding of the instructions.  Patient advised to call back or seek an in-person evaluation if the symptoms or condition worsens.  Duration of encounter: 13 minutes.  Note by: Gaspar Cola, MD Date: 03/25/2019; Time: 9:20 AM

## 2019-03-25 ENCOUNTER — Ambulatory Visit: Payer: BC Managed Care – PPO | Attending: Pain Medicine | Admitting: Pain Medicine

## 2019-03-25 ENCOUNTER — Other Ambulatory Visit: Payer: Self-pay

## 2019-03-25 VITALS — Ht 66.0 in | Wt 250.0 lb

## 2019-03-25 DIAGNOSIS — M25561 Pain in right knee: Secondary | ICD-10-CM

## 2019-03-25 DIAGNOSIS — G894 Chronic pain syndrome: Secondary | ICD-10-CM

## 2019-03-25 DIAGNOSIS — M545 Low back pain: Secondary | ICD-10-CM

## 2019-03-25 DIAGNOSIS — M47816 Spondylosis without myelopathy or radiculopathy, lumbar region: Secondary | ICD-10-CM

## 2019-03-25 DIAGNOSIS — G8929 Other chronic pain: Secondary | ICD-10-CM

## 2019-03-25 MED ORDER — OXYCODONE HCL 5 MG PO TABS: 5.0000 mg | ORAL_TABLET | Freq: Four times a day (QID) | ORAL | 0 refills | Status: DC | PRN

## 2019-03-25 NOTE — Patient Instructions (Signed)

## 2019-04-01 ENCOUNTER — Telehealth: Payer: BC Managed Care – PPO | Admitting: Pain Medicine

## 2019-04-02 ENCOUNTER — Other Ambulatory Visit: Payer: Self-pay

## 2019-04-02 ENCOUNTER — Encounter: Payer: Self-pay | Admitting: Pain Medicine

## 2019-04-02 ENCOUNTER — Ambulatory Visit (HOSPITAL_BASED_OUTPATIENT_CLINIC_OR_DEPARTMENT_OTHER): Payer: BC Managed Care – PPO | Admitting: Pain Medicine

## 2019-04-02 ENCOUNTER — Ambulatory Visit
Admission: RE | Admit: 2019-04-02 | Discharge: 2019-04-02 | Disposition: A | Discharge status: Home / Self Care | Payer: BC Managed Care – PPO | Source: Ambulatory Visit | Attending: Pain Medicine | Admitting: Pain Medicine

## 2019-04-02 VITALS — BP 113/68 | HR 79 | Temp 98.6°F | Resp 16 | Ht 66.0 in | Wt 250.0 lb

## 2019-04-02 DIAGNOSIS — M47817 Spondylosis without myelopathy or radiculopathy, lumbosacral region: Secondary | ICD-10-CM | POA: Insufficient documentation

## 2019-04-02 DIAGNOSIS — G8929 Other chronic pain: Secondary | ICD-10-CM

## 2019-04-02 DIAGNOSIS — M47816 Spondylosis without myelopathy or radiculopathy, lumbar region: Secondary | ICD-10-CM | POA: Insufficient documentation

## 2019-04-02 DIAGNOSIS — M545 Low back pain: Secondary | ICD-10-CM | POA: Insufficient documentation

## 2019-04-02 DIAGNOSIS — M5134 Other intervertebral disc degeneration, thoracic region: Secondary | ICD-10-CM | POA: Insufficient documentation

## 2019-04-02 DIAGNOSIS — M5137 Other intervertebral disc degeneration, lumbosacral region: Secondary | ICD-10-CM

## 2019-04-02 MED ORDER — FENTANYL CITRATE (PF) 100 MCG/2ML IJ SOLN
25.0000 ug | INTRAMUSCULAR | Status: DC | PRN
  Administered 2019-04-02: 09:00:00 100 ug via INTRAVENOUS
  Filled 2019-04-02: qty 2

## 2019-04-02 MED ORDER — MIDAZOLAM HCL 5 MG/5ML IJ SOLN
1.0000 mg | INTRAMUSCULAR | Status: DC | PRN
  Administered 2019-04-02: 3 mg via INTRAVENOUS
  Filled 2019-04-02: qty 5

## 2019-04-02 MED ORDER — LACTATED RINGERS IV SOLN
1000.0000 mL | Freq: Once | INTRAVENOUS | Status: AC
  Administered 2019-04-02: 09:00:00 1000 mL via INTRAVENOUS

## 2019-04-02 MED ORDER — ROPIVACAINE HCL 2 MG/ML IJ SOLN
9.0000 mL | Freq: Once | INTRAMUSCULAR | Status: AC
  Administered 2019-04-02: 9 mL via PERINEURAL
  Filled 2019-04-02: qty 10

## 2019-04-02 MED ORDER — LIDOCAINE HCL 2 % IJ SOLN
20.0000 mL | Freq: Once | INTRAMUSCULAR | Status: AC
  Administered 2019-04-02: 09:00:00 400 mg
  Filled 2019-04-02: qty 40

## 2019-04-02 MED ORDER — TRIAMCINOLONE ACETONIDE 40 MG/ML IJ SUSP
40.0000 mg | Freq: Once | INTRAMUSCULAR | Status: AC
  Administered 2019-04-02: 09:00:00 40 mg
  Filled 2019-04-02: qty 1

## 2019-04-02 NOTE — Patient Instructions (Addendum)

## 2019-04-02 NOTE — Progress Notes (Signed)
Safety precautions to be maintained throughout the outpatient stay will include: orient to surroundings, keep bed in low position, maintain call bell within reach at all times, provide assistance with transfer out of bed and ambulation.  

## 2019-04-02 NOTE — Progress Notes (Signed)
PROVIDER NOTE: Information contained herein reflects review and annotations entered in association with encounter. Interpretation of such information and data should be left to medically-trained personnel. Information provided to patient can be located elsewhere in the medical record under "Patient Instructions". Document created using STT-dictation technology, any transcriptional errors that may result from process are unintentional.    Patient: Adam Petty.  Service Category: Procedure  Provider: Gaspar Cola, MD  DOB: 1960-05-25  DOS: 04/02/2019  Location: [facility]  MRN: [id number]  Setting: Ambulatory - outpatient  Referring Provider: Milinda Pointer, MD  Type: Established Patient  Specialty: Interventional Pain Management  PCP: Zeb Comfort, MD   Primary Reason for Visit: Interventional Pain Management Treatment. CC: Back Pain (low right)  Procedure:          Anesthesia, Analgesia, Anxiolysis:  Type: Lumbar Facet, Medial Branch Block(s) #4  Primary Purpose: Diagnostic (post RFA) Region: Posterolateral Lumbosacral Spine Level: L2, L3, L4, L5, & S1 Medial Branch Level(s). Injecting these levels blocks the L3-4, L4-5, and L5-S1 lumbar facet joints. Laterality: Right  Type: Moderate (Conscious) Sedation combined with Local Anesthesia Indication(s): Analgesia and Anxiety Route: Intravenous (IV) IV Access: Secured Sedation: Meaningful verbal contact was maintained at all times during the procedure  Local Anesthetic: Lidocaine 1-2%  Position: Prone   Indications: 1. Lumbar facet syndrome (WC injury) (Bilateral) (R>L)   2. Spondylosis without myelopathy or radiculopathy, lumbosacral region   3. DDD (degenerative disc disease), lumbosacral   4. Chronic low back pain (WC injury) (Primary Area of Pain) (Bilateral) (R>L)   5. Lumbar spondylosis    Pain Score: Pre-procedure: 7 /10 Post-procedure: 1 /10   Pre-op Assessment:  Adam Petty is a 59  y.o. (year old), male patient, seen today for interventional treatment. He  has a past surgical history that includes Tonsillectomy; Shoulder surgery; Palate surgery; Cervical fusion; Foot surgery; Lumbar laminectomy/decompression microdiscectomy (N/A, 02/06/2013); Knee arthroscopy (Right, 10/02/2013); Knee arthroscopy with medial menisectomy (Right, 03/25/2015); Knee bursectomy (Right, 03/25/2015); Cystoscopy w/ retrogrades (Right, 11/19/2015); Cystoscopy w/ retrogrades (Right, 10/03/2017); Ureteroscopy (Right, 10/03/2017); and CTR (Left, 02/27/2018). Adam Petty has a current medication list which includes the following prescription(s): cyanocobalamin, gabapentin, insulin degludec, ketoconazole, metformin, naproxen sodium, novolog flexpen, oxycodone, [START ON 05/01/2019] oxycodone, [START ON 05/31/2019] oxycodone, potassium citrate, ramipril, rosuvastatin, semaglutide(0.25 or 0.5mg /dos), tapentadol hcl, testosterone, and potassium citrate, and the following Facility-Administered Medications: fentanyl and midazolam. His primarily concern today is the Back Pain (low right)  Initial Vital Signs:  Pulse/HCG Rate: 79ECG Heart Rate: 77 Temp: (!) 97.3 F (36.3 C) Resp: 18 BP: 126/70 SpO2: 100 %  BMI: Estimated body mass index is 40.35 kg/m as calculated from the following:   Height as of this encounter: 5\' 6"  (1.676 m).   Weight as of this encounter: 250 lb (113.4 kg).  Risk Assessment: Allergies: Reviewed. He has No Known Allergies.  Allergy Precautions: None required Coagulopathies: Reviewed. None identified.  Blood-thinner therapy: None at this time Active Infection(s): Reviewed. None identified. Adam Petty is afebrile  Site Confirmation: Adam Petty was asked to confirm the procedure and laterality before marking the site Procedure checklist: Completed Consent: Before the procedure and under the influence of no sedative(s), amnesic(s), or anxiolytics, the patient was informed of the treatment options,  risks and possible complications. To fulfill our ethical and legal obligations, as recommended by the American Medical Association's Code of Ethics, I have informed the patient of my clinical impression; the nature and purpose of the treatment or procedure;  the risks, benefits, and possible complications of the intervention; the alternatives, including doing nothing; the risk(s) and benefit(s) of the alternative treatment(s) or procedure(s); and the risk(s) and benefit(s) of doing nothing. The patient was provided information about the general risks and possible complications associated with the procedure. These may include, but are not limited to: failure to achieve desired goals, infection, bleeding, organ or nerve damage, allergic reactions, paralysis, and death. In addition, the patient was informed of those risks and complications associated to Spine-related procedures, such as failure to decrease pain; infection (i.e.: Meningitis, epidural or intraspinal abscess); bleeding (i.e.: epidural hematoma, subarachnoid hemorrhage, or any other type of intraspinal or peri-dural bleeding); organ or nerve damage (i.e.: Any type of peripheral nerve, nerve root, or spinal cord injury) with subsequent damage to sensory, motor, and/or autonomic systems, resulting in permanent pain, numbness, and/or weakness of one or several areas of the body; allergic reactions; (i.e.: anaphylactic reaction); and/or death. Furthermore, the patient was informed of those risks and complications associated with the medications. These include, but are not limited to: allergic reactions (i.e.: anaphylactic or anaphylactoid reaction(s)); adrenal axis suppression; blood sugar elevation that in diabetics may result in ketoacidosis or comma; water retention that in patients with history of congestive heart failure may result in shortness of breath, pulmonary edema, and decompensation with resultant heart failure; weight gain; swelling or edema;  medication-induced neural toxicity; particulate matter embolism and blood vessel occlusion with resultant organ, and/or nervous system infarction; and/or aseptic necrosis of one or more joints. Finally, the patient was informed that Medicine is not an exact science; therefore, there is also the possibility of unforeseen or unpredictable risks and/or possible complications that may result in a catastrophic outcome. The patient indicated having understood very clearly. We have given the patient no guarantees and we have made no promises. Enough time was given to the patient to ask questions, all of which were answered to the patient's satisfaction. Adam Petty has indicated that he wanted to continue with the procedure. Attestation: I, the ordering provider, attest that I have discussed with the patient the benefits, risks, side-effects, alternatives, likelihood of achieving goals, and potential problems during recovery for the procedure that I have provided informed consent. Date  Time: 04/02/2019  8:24 AM  Pre-Procedure Preparation:  Monitoring: As per clinic protocol. Respiration, ETCO2, SpO2, BP, heart rate and rhythm monitor placed and checked for adequate function Safety Precautions: Patient was assessed for positional comfort and pressure points before starting the procedure. Time-out: I initiated and conducted the "Time-out" before starting the procedure, as per protocol. The patient was asked to participate by confirming the accuracy of the "Time Out" information. Verification of the correct person, site, and procedure were performed and confirmed by me, the nursing staff, and the patient. "Time-out" conducted as per Joint Commission's Universal Protocol (UP.01.01.01). Time: 0854  Description of Procedure:          Laterality: Right Levels:  L2, L3, L4, L5, & S1 Medial Branch Level(s) Area Prepped: Posterior Lumbosacral Region Prepping solution: DuraPrep (Iodine Povacrylex [0.7% available  iodine] and Isopropyl Alcohol, 74% w/w) Safety Precautions: Aspiration looking for blood return was conducted prior to all injections. At no point did we inject any substances, as a needle was being advanced. Before injecting, the patient was told to immediately notify me if he was experiencing any new onset of "ringing in the ears, or metallic taste in the mouth". No attempts were made at seeking any paresthesias. Safe injection practices and needle  disposal techniques used. Medications properly checked for expiration dates. SDV (single dose vial) medications used. After the completion of the procedure, all disposable equipment used was discarded in the proper designated medical waste containers. Local Anesthesia: Protocol guidelines were followed. The patient was positioned over the fluoroscopy table. The area was prepped in the usual manner. The time-out was completed. The target area was identified using fluoroscopy. A 12-in long, straight, sterile hemostat was used with fluoroscopic guidance to locate the targets for each level blocked. Once located, the skin was marked with an approved surgical skin marker. Once all sites were marked, the skin (epidermis, dermis, and hypodermis), as well as deeper tissues (fat, connective tissue and muscle) were infiltrated with a small amount of a short-acting local anesthetic, loaded on a 10cc syringe with a 25G, 1.5-in  Needle. An appropriate amount of time was allowed for local anesthetics to take effect before proceeding to the next step. Local Anesthetic: Lidocaine 2.0% The unused portion of the local anesthetic was discarded in the proper designated containers. Technical explanation of process:  L2 Medial Branch Nerve Block (MBB): The target area for the L2 medial branch is at the junction of the postero-lateral aspect of the superior articular process and the superior, posterior, and medial edge of the transverse process of L3. Under fluoroscopic guidance, a  Quincke needle was inserted until contact was made with os over the superior postero-lateral aspect of the pedicular shadow (target area). After negative aspiration for blood, 0.5 mL of the nerve block solution was injected without difficulty or complication. The needle was removed intact. L3 Medial Branch Nerve Block (MBB): The target area for the L3 medial branch is at the junction of the postero-lateral aspect of the superior articular process and the superior, posterior, and medial edge of the transverse process of L4. Under fluoroscopic guidance, a Quincke needle was inserted until contact was made with os over the superior postero-lateral aspect of the pedicular shadow (target area). After negative aspiration for blood, 0.5 mL of the nerve block solution was injected without difficulty or complication. The needle was removed intact. L4 Medial Branch Nerve Block (MBB): The target area for the L4 medial branch is at the junction of the postero-lateral aspect of the superior articular process and the superior, posterior, and medial edge of the transverse process of L5. Under fluoroscopic guidance, a Quincke needle was inserted until contact was made with os over the superior postero-lateral aspect of the pedicular shadow (target area). After negative aspiration for blood, 0.5 mL of the nerve block solution was injected without difficulty or complication. The needle was removed intact. L5 Medial Branch Nerve Block (MBB): The target area for the L5 medial branch is at the junction of the postero-lateral aspect of the superior articular process and the superior, posterior, and medial edge of the sacral ala. Under fluoroscopic guidance, a Quincke needle was inserted until contact was made with os over the superior postero-lateral aspect of the pedicular shadow (target area). After negative aspiration for blood, 0.5 mL of the nerve block solution was injected without difficulty or complication. The needle was  removed intact. S1 Medial Branch Nerve Block (MBB): The target area for the S1 medial branch is at the posterior and inferior 6 o'clock position of the L5-S1 facet joint. Under fluoroscopic guidance, the Quincke needle inserted for the L5 MBB was redirected until contact was made with os over the inferior and postero aspect of the sacrum, at the 6 o' clock position under the L5-S1  facet joint (Target area). After negative aspiration for blood, 0.5 mL of the nerve block solution was injected without difficulty or complication. The needle was removed intact.  Nerve block solution: 0.2% PF-Ropivacaine + Triamcinolone (40 mg/mL) diluted to a final concentration of 4 mg of Triamcinolone/mL of Ropivacaine The unused portion of the solution was discarded in the proper designated containers. Procedural Needles: 22-gauge, 3.5-inch, Quincke needles used for all levels.  Once the entire procedure was completed, the treated area was cleaned, making sure to leave some of the prepping solution back to take advantage of its long term bactericidal properties.   Illustration of the posterior view of the lumbar spine and the posterior neural structures. Laminae of L2 through S1 are labeled. DPRL5, dorsal primary ramus of L5; DPRS1, dorsal primary ramus of S1; DPR3, dorsal primary ramus of L3; FJ, facet (zygapophyseal) joint L3-L4; I, inferior articular process of L4; LB1, lateral branch of dorsal primary ramus of L1; IAB, inferior articular branches from L3 medial branch (supplies L4-L5 facet joint); IBP, intermediate branch plexus; MB3, medial branch of dorsal primary ramus of L3; NR3, third lumbar nerve root; S, superior articular process of L5; SAB, superior articular branches from L4 (supplies L4-5 facet joint also); TP3, transverse process of L3.  Vitals:   04/02/19 0901 04/02/19 0908 04/02/19 0918 04/02/19 0928  BP: (!) 141/92 133/86 108/62 113/68  Pulse:      Resp: 20 16 16 16   Temp:  98.6 F (37 C)    SpO2:  95% 96% 98% 98%  Weight:      Height:         Start Time: 0854 hrs. End Time: 0901 hrs.  Imaging Guidance (Spinal):          Type of Imaging Technique: Fluoroscopy Guidance (Spinal) Indication(s): Assistance in needle guidance and placement for procedures requiring needle placement in or near specific anatomical locations not easily accessible without such assistance. Exposure Time: Please see nurses notes. Contrast: None used. Fluoroscopic Guidance: I was personally present during the use of fluoroscopy. "Tunnel Vision Technique" used to obtain the best possible view of the target area. Parallax error corrected before commencing the procedure. "Direction-depth-direction" technique used to introduce the needle under continuous pulsed fluoroscopy. Once target was reached, antero-posterior, oblique, and lateral fluoroscopic projection used confirm needle placement in all planes. Images permanently stored in EMR. Interpretation: No contrast injected. I personally interpreted the imaging intraoperatively. Adequate needle placement confirmed in multiple planes. Permanent images saved into the patient's record.  Antibiotic Prophylaxis:   Anti-infectives (From admission, onward)   None     Indication(s): None identified  Post-operative Assessment:  Post-procedure Vital Signs:  Pulse/HCG Rate: 7977 Temp: 98.6 F (37 C) Resp: 16 BP: 113/68 SpO2: 98 %  EBL: None  Complications: No immediate post-treatment complications observed by team, or reported by patient.  Note: The patient tolerated the entire procedure well. A repeat set of vitals were taken after the procedure and the patient was kept under observation following institutional policy, for this type of procedure. Post-procedural neurological assessment was performed, showing return to baseline, prior to discharge. The patient was provided with post-procedure discharge instructions, including a section on how to identify potential  problems. Should any problems arise concerning this procedure, the patient was given instructions to immediately contact us, at any time, without hesitation. In any case, we plan to contact the patient by telephone for a follow-up status report regarding this interventional procedure.  Comments:  No additional relevant information.  Plan of Care  Orders:  Orders Placed This Encounter  Procedures  . LUMBAR FACET(MEDIAL BRANCH NERVE BLOCK) MBNB    Scheduling Instructions:     Procedure: Lumbar facet block (AKA.: Lumbosacral medial branch nerve block)     Side: Right-sided     Level: L3-4, L4-5, & L5-S1 Facets (L2, L3, L4, L5, & S1 Medial Branch Nerves)     Sedation: Patient's choice.     Timeframe: Today    Order Specific Question:   Where will this procedure be performed?    Answer:   [facility]  . DG PA[facility] C-ARM 1-60 MIN NO REPORT    Intraoperative interpretation by procedural physician at [facility].    Standing Status:   Standing    Number of Occurrences:   1    Order Specific Question:   Reason for exam:    Answer:   Assistance in needle guidance and placement for procedures requiring needle placement in or near specific anatomical locations not easily accessible without such assistance.  . Informed Consent Details: Physician/Practitioner Attestation; Transcribe to consent form and obtain patient signature    Nursing Order: Transcribe to consent form and obtain patient signature. Note: Always confirm laterality of pain with Adam Petty, before procedure. Procedure: Lumbar Facet Block  under fluoroscopic guidance Indication/Reason: Low Back Pain, with our without leg pain, due to Facet Joint Arthralgia (Joint Pain) known as Lumbar Facet Syndrome, secondary to Lumbar, and/or Lumbosacral Spondylosis (Arthritis of the Spine), without myelopathy or radiculopathy (Nerve Damage). Provider Attestation: I, Despina Boan A. Laban Emperor, MD, (Pain Management Specialist), the  physician/practitioner, attest that I have discussed with the patient the benefits, risks, side effects, alternatives, likelihood of achieving goals and potential problems during recovery for the procedure that I have provided informed consent.  . Provide equipment / supplies at bedside    Equipment required: Single use, disposable, "Block Tray"    Standing Status:   Standing    Number of Occurrences:   1    Order Specific Question:   Specify    Answer:   Block Tray   Chronic Opioid Analgesic:  Oxycodone IR 5 mg, 1 tab PO q 6 hrs (20 mg/day)  MME/day: 30 mg/day.   Medications ordered for procedure: Meds ordered this encounter  Medications  . lidocaine (XYLOCAINE) 2 % (with pres) injection 400 mg  . lactated ringers infusion 1,000 mL  . midazolam (VERSED) 5 MG/5ML injection 1-2 mg    Make sure Flumazenil is available in the pyxis when using this medication. If oversedation occurs, administer 0.2 mg IV over 15 sec. If after 45 sec no response, administer 0.2 mg again over 1 min; may repeat at 1 min intervals; not to exceed 4 doses (1 mg)  . fentaNYL (SUBLIMAZE) injection 25-50 mcg    Make sure Narcan is available in the pyxis when using this medication. In the event of respiratory depression (RR< 8/min): Titrate NARCAN (naloxone) in increments of 0.1 to 0.2 mg IV at 2-3 minute intervals, until desired degree of reversal.  . ropivacaine (PF) 2 mg/mL (0.2%) (NAROPIN) injection 9 mL  . triamcinolone acetonide (KENALOG-40) injection 40 mg   Medications administered: We administered lidocaine, lactated ringers, midazolam, fentaNYL, ropivacaine (PF) 2 mg/mL (0.2%), and triamcinolone acetonide.  See the medical record for exact dosing, route, and time of administration.  Follow-up plan:   Return in about 2 weeks (around 04/16/2019) for (VV), (PP).       Interventional treatment options: Planned, scheduled, and/or  pending:   Possible repeat MRI.  The last CT was done on 2016, last MRI 2011.   Since then he has been experiencing more right lower extremity pain.  He compares this to the type of pain that he had prior to the surgery of his lumbar spine.  He is also experiencing pain in the center of the lower back, possibly discogenic in nature.  Depending on the results of the MRI (which we would order with and without contrast) then we may need to consider a diagnostic discogram with the possibility of following up with [facility].   Under consideration:   Possible right Suprascapular nerveRFA Possible left lumbar facet RFA  Diagnostic right knee genicular nerve block.  Possible right knee genicular nerve RFA   Therapeutic/palliative (PRN):   Palliative right lumbar facet block #4  Palliative left lumbar facet block #3  Palliative right lumbar facet RFA #2  (Right side done 04/13/2016) (100% relief) Diagnostic right Suprascapular nerve Block#2     Recent Visits Date Type Provider Dept  03/25/19 Telemedicine Delano Metz, MD [facility]  Showing recent visits within past 90 days and meeting all other requirements   Today's Visits Date Type Provider Dept  04/02/19 Procedure visit Delano Metz, MD [facility]  Showing today's visits and meeting all other requirements   Future Appointments Date Type Provider Dept  04/17/19 Appointment Delano Metz, MD [facility]  06/24/19 Appointment Delano Metz, MD [facility]  Showing future appointments within next 90 days and meeting all other requirements   Disposition: Discharge home  Discharge (Date  Time): 04/02/2019; 0930 hrs.   Primary Care Physician: Lazarus Gowda, MD Location: [facility] Note by: Oswaldo Done, MD Date: 04/02/2019; Time: 10:33 AM  Disclaimer:  Medicine is not an exact science. The only guarantee in medicine is that nothing is guaranteed. It is important to note that the decision to proceed with this  intervention was based on the information collected from the patient. The Data and conclusions were drawn from the patient's questionnaire, the interview, and the physical examination. Because the information was provided in large part by the patient, it cannot be guaranteed that it has not been purposely or unconsciously manipulated. Every effort has been made to obtain as much relevant data as possible for this evaluation. It is important to note that the conclusions that lead to this procedure are derived in large part from the available data. Always take into account that the treatment will also be dependent on availability of resources and existing treatment guidelines, considered by other Pain Management Practitioners as being common knowledge and practice, at the time of the intervention. For Medico-Legal purposes, it is also important to point out that variation in procedural techniques and pharmacological choices are the acceptable norm. The indications, contraindications, technique, and results of the above procedure should only be interpreted and judged by a Board-Certified Interventional Pain Specialist with extensive familiarity and expertise in the same exact procedure and technique.

## 2019-04-03 ENCOUNTER — Telehealth: Payer: Self-pay

## 2019-04-03 NOTE — Telephone Encounter (Signed)
Post procedure phone call.  LM 

## 2019-04-16 ENCOUNTER — Telehealth: Payer: Self-pay | Admitting: *Deleted

## 2019-04-16 ENCOUNTER — Encounter: Payer: Self-pay | Admitting: Pain Medicine

## 2019-04-16 NOTE — Patient Instructions (Signed)
____________________________________________________________________________________________  Preparing for Procedure with Sedation  Procedure appointments are limited to planned procedures: . No Prescription Refills. . No disability issues will be discussed. . No medication changes will be discussed.  Instructions: . Oral Intake: Do not eat or drink anything for at least 3 hours prior to your procedure. (Exception: Blood Pressure Medication. See below.) . Transportation: Unless otherwise stated by your physician, you may drive yourself after the procedure. . Blood Pressure Medicine: Do not forget to take your blood pressure medicine with a sip of water the morning of the procedure. If your Diastolic (lower reading)is above 100 mmHg, elective cases will be cancelled/rescheduled. . Blood thinners: These will need to be stopped for procedures. Notify our staff if you are taking any blood thinners. Depending on which one you take, there will be specific instructions on how and when to stop it. . Diabetics on insulin: Notify the staff so that you can be scheduled 1st case in the morning. If your diabetes requires high dose insulin, take only  of your normal insulin dose the morning of the procedure and notify the staff that you have done so. . Preventing infections: Shower with an antibacterial soap the morning of your procedure. . Build-up your immune system: Take 1000 mg of Vitamin C with every meal (3 times a day) the day prior to your procedure. . Antibiotics: Inform the staff if you have a condition or reason that requires you to take antibiotics before dental procedures. . Pregnancy: If you are pregnant, call and cancel the procedure. . Sickness: If you have a cold, fever, or any active infections, call and cancel the procedure. . Arrival: You must be in the facility at least 30 minutes prior to your scheduled procedure. . Children: Do not bring children with you. . Dress appropriately:  Bring dark clothing that you would not mind if they get stained. . Valuables: Do not bring any jewelry or valuables.  Reasons to call and reschedule or cancel your procedure: (Following these recommendations will minimize the risk of a serious complication.) . Surgeries: Avoid having procedures within 2 weeks of any surgery. (Avoid for 2 weeks before or after any surgery). . Flu Shots: Avoid having procedures within 2 weeks of a flu shots or . (Avoid for 2 weeks before or after immunizations). . Barium: Avoid having a procedure within 7-10 days after having had a radiological study involving the use of radiological contrast. (Myelograms, Barium swallow or enema study). . Heart attacks: Avoid any elective procedures or surgeries for the initial 6 months after a "Myocardial Infarction" (Heart Attack). . Blood thinners: It is imperative that you stop these medications before procedures. Let us know if you if you take any blood thinner.  . Infection: Avoid procedures during or within two weeks of an infection (including chest colds or gastrointestinal problems). Symptoms associated with infections include: Localized redness, fever, chills, night sweats or profuse sweating, burning sensation when voiding, cough, congestion, stuffiness, runny nose, sore throat, diarrhea, nausea, vomiting, cold or Flu symptoms, recent or current infections. It is specially important if the infection is over the area that we intend to treat. . Heart and lung problems: Symptoms that may suggest an active cardiopulmonary problem include: cough, chest pain, breathing difficulties or shortness of breath, dizziness, ankle swelling, uncontrolled high or unusually low blood pressure, and/or palpitations. If you are experiencing any of these symptoms, cancel your procedure and contact your primary care physician for an evaluation.  Remember:  Regular Business hours are:    Monday to Thursday 8:00 AM to 4:00 PM  Provider's  Schedule: Sharley Keeler, MD:  Procedure days: Tuesday and Thursday 7:30 AM to 4:00 PM  Bilal Lateef, MD:  Procedure days: Monday and Wednesday 7:30 AM to 4:00 PM ____________________________________________________________________________________________    

## 2019-04-16 NOTE — Progress Notes (Signed)
Pain relief after procedure (treated area only): (Questions asked to patient) 1. Starting about 15 minutes after the procedure, and "while the area was still numb" (from the local anesthetics), were you having any of your usual pain "in that area" (the treated area)?  (NOTE: NOT including the discomfort from the needle sticks.) First 1 hour: 60 % better. First 4-6 hours: 60 % better.  3. How much better is your pain now, when compared to before the procedure? Current benefit: 60 % better. 4. Can you move better now? Improvement in ROM (Range of Motion): Yes. 5. Can you do more now? Improvement in function: Yes. 4. Did you have any problems with the procedure? Side-effects/Complications: Yes.

## 2019-04-16 NOTE — Telephone Encounter (Signed)
Attempted to call for pre appointment review of allergies/meds. Message left. 

## 2019-04-16 NOTE — Progress Notes (Signed)
Patient: Adam Petty.  Service Category: E/M  Provider: Gaspar Cola, MD  DOB: Pet[birth year]  DOS: 04/17/2019  Location: Office  MRN: [id number]  Setting: Ambulatory outpatient  Referring Provider: Zeb Comfort, MD  Type: Established Patient  Specialty: Interventional Pain Management  PCP: Adam Comfort, MD  Location: Remote location  Delivery: TeleHealth     Virtual Encounter - Pain Management PROVIDER NOTE: Information contained herein reflects review and annotations entered in association with encounter. Interpretation of such information and data should be left to medically-trained personnel. Information provided to patient can be located elsewhere in the medical record under "Patient Instructions". Document created using STT-dictation technology, any transcriptional errors that may result from process are unintentional.    Contact & Pharmacy Preferred: [phone] Home: [phone] (home) Mobile: [phone] (mobile) E-mail: [email or web address]  [id number] [place], [address] [place] [address] Phone: [phone] Fax: [phone]   Pre-screening  Adam Petty offered "in-person" vs "virtual" encounter. He indicated preferring virtual for this encounter.   Reason COVID-19*  Social distancing based on CDC and AMA recommendations.   I contacted Adam Petty. on 04/17/2019 via telephone.      I clearly identified myself as Adam Cola, MD. I verified that I was speaking with the correct person using two identifiers (Name: Adam Petty., and date of birth: Adam Petty).  Consent I sought verbal advanced consent from Adam Petty. for virtual visit interactions. I informed Adam Petty of possible security and privacy concerns, risks, and limitations associated with providing "not-in-person" medical evaluation and management services. I also informed  Adam Petty of the availability of "in-person" appointments. Finally, I informed him that there would be a charge for the virtual visit and that he could be  personally, fully or partially, financially responsible for it. Adam Petty expressed understanding and agreed to proceed.   Historic Elements   Adam Petty. is a 59 y.o. year old, male patient evaluated today after his last contact with our practice on 04/16/2019. 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 He also  has a past surgical history that includes Tonsillectomy; Shoulder surgery; Palate surgery; Cervical fusion; Foot surgery; Lumbar laminectomy/decompression microdiscectomy (N/A, 02/06/2013); Knee arthroscopy (Right, 10/02/2013); Knee arthroscopy with medial menisectomy (Right, 03/25/2015); Knee bursectomy (Right, 03/25/2015); Cystoscopy w/ retrogrades (Right, 11/19/2015); Cystoscopy w/ retrogrades (Right, 10/03/2017); Ureteroscopy (Right, 10/03/2017); and CTR (Left, 02/27/2018). Adam Petty has a current medication list which includes the following prescription(s): cyanocobalamin, gabapentin, insulin degludec, ketoconazole, metformin, naproxen sodium, novolog flexpen, oxycodone, [START ON 05/01/2019] oxycodone, [START ON 05/31/2019] oxycodone, potassium citrate, potassium citrate, ramipril, rosuvastatin, semaglutide(0.25 or 0.8m/dos), tapentadol hcl, and testosterone. He  reports that he has never smoked. He has never used smokeless tobacco. He reports current alcohol use. He reports that he does not use drugs. Adam Petty No Known Allergies.   HPI  Today, he is being contacted for a post-procedure assessment.  The patient refers that most of the benefit he had pain in the  area of the right lower back, but he continued to have pain going down the right leg.  This pain initially we assume that it was referred  pain from the lumbar facets because of travel through the back of the leg down to the area of the knee.  However, further investigation today reveals that he has pain that goes all the way down into his heel and then he has numbness in his foot that seems to affect the bottom of the foot with throbbing pain affecting the little toe and what seems to be an S1 dermatomal distribution.  He does have a history of prior low back surgery and therefore there is a possibility that we may be dealing with epidural fibrosis.  His last MRI was done on 03/20/2014 and he has continued to experience pain that he describes as slowly getting worse.  If he does not improve with the caudal epidural steroid injection they were planning on doing, then we will need to repeat his MRI.  If he does get good benefit with the call, he may be a good candidate for a Adam Petty procedure.  Post-Procedure Evaluation  Procedure (04/02/2019): Palliative right-sided lumbar facet block #4 under fluoroscopic guidance and IV sedation Pre-procedure pain level:  7/10 Post-procedure: 1/10 (> 50% relief)  Sedation: Sedation provided.  Adam Rochester, RN  04/16/2019  1:24 PM  Sign when Signing Visit Pain relief after procedure (treated area only): (Questions asked to patient) 1. Starting about 15 minutes after the procedure, and "while the area was still numb" (from the local anesthetics), were you having any of your usual pain "in that area" (the treated area)?  (NOTE: NOT including the discomfort from the needle sticks.) First 1 hour: 60 % better. First 4-6 hours: 60 % better.  3. How much better is your pain now, when compared to before the procedure? Current benefit: 60 % better. 4. Can you move better now? Improvement in ROM (Range of Motion): Yes. 5. Can you do more now? Improvement in function:  Yes. 4. Did you have any problems with the procedure? Side-effects/Complications: Yes.  Current benefits: Defined as benefit that persist at this time.   Analgesia:  >50% relief Function: Adam Petty reports improvement in function ROM: Mr. Damore reports improvement in ROM  Pharmacotherapy Assessment  Analgesic: Oxycodone IR 5 mg, 1 tab PO q 6 hrs (20 mg/day)  MME/day: 30 mg/day.   Monitoring: [place] PMP: PDMP reviewed during this encounter.       Pharmacotherapy: No side-effects or adverse reactions reported. Compliance: No problems identified. Effectiveness: Clinically acceptable. Plan: Refer to "POC".  UDS:  Summary  Date Value Ref Range Status  12/07/2017 FINAL  Final    Comment:    ==================================================================== TOXASSURE SELECT 13 (MW) ==================================================================== Test                             Result       Flag       Units Drug Present and Declared for Prescription Verification   Oxycodone                      977          EXPECTED   ng/mg creat   Oxymorphone                    917          EXPECTED   ng/mg creat   Noroxycodone                   1160  EXPECTED   ng/mg creat   Noroxymorphone                 114          EXPECTED   ng/mg creat    Sources of oxycodone are scheduled prescription medications.    Oxymorphone, noroxycodone, and noroxymorphone are expected    metabolites of oxycodone. Oxymorphone is also available as a    scheduled prescription medication. ==================================================================== Test                      Result    Flag   Units      Ref Range   Creatinine              70               mg/dL      >=20 ==================================================================== Declared Medications:  The flagging and interpretation on this report are based on the  following declared medications.  Unexpected results may arise from   inaccuracies in the declared medications.  **Note: The testing scope of this panel includes these medications:  Oxycodone  **Note: The testing scope of this panel does not include following  reported medications:  Calcium  Cyanocobalamin  Gabapentin  Ibuprofen  Insulin  Ketoconazole  Liraglutide (Victoza)  Metformin  Naproxen  Potassium  Ramipril  Rosuvastatin ==================================================================== For clinical consultation, please call [phone]. ====================================================================    Laboratory Chemistry Profile   Renal Lab Results  Component Value Date   BUN 12 10/02/2017   CREATININE 1.06 10/02/2017   GFRAA >60 10/02/2017   GFRNONAA >60 10/02/2017     Hepatic Lab Results  Component Value Date   AST 41 03/12/2015   ALT 55 03/12/2015   ALBUMIN 4.2 03/12/2015   ALKPHOS 89 03/12/2015     Electrolytes Lab Results  Component Value Date   NA 137 10/02/2017   K 4.2 10/02/2017   CL 103 10/02/2017   CALCIUM 8.8 (L) 10/02/2017   MG 1.8 03/12/2015     Bone No results found for: VD25OH, VD125OH2TOT, IO2703JK0, XF8182XH3, 25OHVITD1, 25OHVITD2, 25OHVITD3, TESTOFREE, TESTOSTERONE   Inflammation (CRP: Acute Phase) (ESR: Chronic Phase) Lab Results  Component Value Date   CRP 0.6 03/12/2015   ESRSEDRATE 9 03/12/2015       Note: Above Lab results reviewed.  Imaging  DG PAIN CLINIC C-ARM 1-60 MIN NO REPORT Fluoro was used, but no Radiologist interpretation will be provided.  Please refer to "NOTES" tab for provider progress note.  Assessment  The primary encounter diagnosis was Chronic pain syndrome (WC injury). 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  Plan of Care  Problem-specific:  No problem-specific Assessment & Plan notes found for this encounter.  Mr. Jessejames Steelman. has a current medication list which includes the following long-term medication(s): gabapentin, oxycodone, [START ON 05/01/2019] oxycodone, [START ON 05/31/2019] oxycodone, and testosterone.  Pharmacotherapy (Medications Ordered): No orders of the defined types were placed in this encounter.  Orders:  Orders Placed This Encounter  Procedures  . LUMBAR FACET(MEDIAL BRANCH NERVE BLOCK) MBNB    Scheduling timeframe: (PRN procedure) Mr.  Shorey will call when needed. Clinical indication: Axial low back pain. Lumbosacral Spondylosis (M47.897).  Sedation: Usually done with sedation. (May be done without sedation if so desired by patient.) Requirements: NPO x 8 hrs.; Driver; Stop blood thinners. Interval: No sooner than two weeks for diagnostic or therapeutic. No sooner than every other month for palliative.    Standing Status:   Standing    Number of Occurrences:   5    Standing Expiration Date:   04/15/2020    Scheduling Instructions:     Procedure: Lumbar facet block (AKA.: Lumbosacral medial branch nerve block)     Level: L3-4, L4-5, & L5-S1 Facets (L2, L3, L4, L5, & S1 Medial Branch Nerves)     Laterality: Bilateral    Order Specific Question:   Where will this procedure be performed?    Answer:   [facility]  . Caudal Epidural Injection    Standing Status:   Future    Standing Expiration Date:   05/17/2019    Scheduling Instructions:     Laterality: Right-sided     Level(s): Sacrococcygeal canal (Tailbone area)     Sedation: Patient's choice     Scheduling Timeframe: As soon as pre-approved    Order Specific Question:   Where will this procedure be performed?    Answer:   [facility]   Follow-up plan:   Return in about 2 months (around 06/24/2019) for (VV), (MM), in addition, Procedure (w/ sedation): (R) Caudal ESI #1 + Dx epidurogram.      Interventional treatment options: Planned, scheduled, and/or pending:   Diagnostic right caudal ESI #1 + diagnostic epidurogram #1 under fluoroscopic guidance and IV sedation.  The patient's last MRI was done in 2016 at a [facility] institution.  He continues to have pain that he describes as slowly worsening.  He seems to have 2 sources for this pain.  There is the pain in his back which clearly is coming from his facet joints, but there is also pain down his right lower extremity that seems to be an S1 lumbosacral sensory radiculopathy.  Since he does have a prior history of back surgery, it is very possible that this may have something to do with epidural fibrosis.  We will be scheduling the patient to come in for a diagnostic right-sided caudal ESI + epidurogram under fluoroscopic guidance and IV sedation to determine if this is the case.  Satiate 21   Under consideration:   Diagnostic right caudal ESI #1 + diagnostic epidurogram #1  Possible right Suprascapular nerveRFA Possible left lumbar facet RFA  Diagnostic right knee genicular nerve block.  Possible right knee genicular nerve RFA   Therapeutic/palliative (PRN):   Palliative right lumbar facet block #4  Palliative left lumbar facet block #3  Palliative right lumbar facet RFA #2  (Right side done 04/13/2016) (100% relief) Diagnostic right Suprascapular nerve Block#2     Recent Visits Date Type Provider Dept  04/02/19 Procedure visit Milinda Pointer, MD [facility]  03/25/19 Telemedicine Milinda Pointer, MD [facility]  Showing recent visits within past 90 days and meeting all other requirements   Today's Visits Date Type Provider Dept  04/17/19 Telemedicine Milinda Pointer, MD [facility]  Showing today's visits and meeting  all other requirements   Future Appointments Date Type Provider Dept  06/24/19 Appointment Milinda Pointer, MD [facility]  Showing future appointments within next 90 days and meeting all other requirements   I discussed the  assessment and treatment plan with the patient. The patient was provided an opportunity to ask questions and all were answered. The patient agreed with the plan and demonstrated an understanding of the instructions.  Patient advised to call back or seek an in-person evaluation if the symptoms or condition worsens.  Duration of encounter: 15 minutes.  Note by: Adam Cola, MD Date: 04/17/2019; Time: 9:28 AM

## 2019-04-17 ENCOUNTER — Ambulatory Visit: Payer: BC Managed Care – PPO | Attending: Pain Medicine | Admitting: Pain Medicine

## 2019-04-17 ENCOUNTER — Other Ambulatory Visit: Payer: Self-pay

## 2019-04-17 VITALS — Ht 66.0 in | Wt 245.0 lb

## 2019-04-17 DIAGNOSIS — M47816 Spondylosis without myelopathy or radiculopathy, lumbar region: Secondary | ICD-10-CM | POA: Diagnosis not present

## 2019-04-17 DIAGNOSIS — G894 Chronic pain syndrome: Secondary | ICD-10-CM | POA: Diagnosis not present

## 2019-04-17 DIAGNOSIS — M545 Low back pain: Secondary | ICD-10-CM | POA: Diagnosis not present

## 2019-04-17 DIAGNOSIS — M5417 Radiculopathy, lumbosacral region: Secondary | ICD-10-CM

## 2019-04-17 DIAGNOSIS — G8929 Other chronic pain: Secondary | ICD-10-CM

## 2019-04-17 DIAGNOSIS — M25511 Pain in right shoulder: Secondary | ICD-10-CM

## 2019-04-17 DIAGNOSIS — M25561 Pain in right knee: Secondary | ICD-10-CM

## 2019-04-17 DIAGNOSIS — M5137 Other intervertebral disc degeneration, lumbosacral region: Secondary | ICD-10-CM

## 2019-04-17 DIAGNOSIS — M961 Postlaminectomy syndrome, not elsewhere classified: Secondary | ICD-10-CM

## 2019-04-17 DIAGNOSIS — M79604 Pain in right leg: Secondary | ICD-10-CM

## 2019-04-18 ENCOUNTER — Telehealth: Payer: Self-pay | Admitting: *Deleted

## 2019-05-14 ENCOUNTER — Ambulatory Visit: Payer: BC Managed Care – PPO | Admitting: Pain Medicine

## 2019-06-21 NOTE — Progress Notes (Signed)
Unsuccessful attempt to contact patient for Virtual Visit (Pain Management Telehealth)   Patient provided contact information:  [phone] (home); [phone] (mobile); (Preferred) [phone] [email or web address]   Pre-screening:  Our staff was unsuccessful in contacting Adam Petty using the above provided information.   I unsuccessfully attempted to make contact with Adam Petty. on 06/24/2019 via telephone. I was unable to complete the virtual encounter due to call going directly to voicemail. I was able to leave a message.  Pharmacotherapy Assessment  Analgesic: Oxycodone IR 5 mg, 1 tab PO q 6 hrs (20 mg/day)  MME/day: 30 mg/day.   Follow-up plan:   Reschedule Visit.     Interventional treatment options: Planned, scheduled, and/or pending:   Diagnostic right caudal ESI #1 + diagnostic epidurogram #1 under fluoroscopic guidance and IV sedation.  The patient's last MRI was done in 2016 at a [facility] institution.  He continues to have pain that he describes as slowly worsening.  He seems to have 2 sources for this pain.  There is the pain in his back which clearly is coming from his facet joints, but there is also pain down his right lower extremity that seems to be an S1 lumbosacral sensory radiculopathy.  Since he does have a prior history of back surgery, it is very possible that this may have something to do with epidural fibrosis.  We will be scheduling the patient to come in for a diagnostic right-sided caudal ESI + epidurogram under fluoroscopic guidance and IV sedation to determine if this is the case.  Satiate 21   Under consideration:   Diagnostic right caudal ESI #1 + diagnostic epidurogram #1  Possible right Suprascapular nerveRFA Possible left lumbar facet RFA  Diagnostic right knee genicular nerve block.  Possible right knee genicular nerve RFA   Therapeutic/palliative (PRN):   Palliative right lumbar facet block #4  Palliative left lumbar facet block #3   Palliative right lumbar facet RFA #2  (Right side done 04/13/2016) (100% relief) Diagnostic right Suprascapular nerve Block#2     Recent Visits Date Type Provider Dept  04/17/19 Telemedicine Delano Metz, MD [facility]  04/02/19 Procedure visit Delano Metz, MD [facility]  Showing recent visits within past 90 days and meeting all other requirements   Today's Visits Date Type Provider Dept  06/24/19 Telemedicine Delano Metz, MD [facility]  Showing today's visits and meeting all other requirements   Future Appointments No visits were found meeting these conditions.  Showing future appointments within next 90 days and meeting all other requirements    Note by: Oswaldo Done, MD Date: 06/24/2019; Time: 12:40 PM

## 2019-06-24 ENCOUNTER — Ambulatory Visit (HOSPITAL_BASED_OUTPATIENT_CLINIC_OR_DEPARTMENT_OTHER): Payer: BC Managed Care – PPO | Admitting: Pain Medicine

## 2019-06-24 ENCOUNTER — Other Ambulatory Visit: Payer: Self-pay

## 2019-06-24 DIAGNOSIS — M25561 Pain in right knee: Secondary | ICD-10-CM

## 2019-06-24 DIAGNOSIS — M792 Neuralgia and neuritis, unspecified: Secondary | ICD-10-CM

## 2019-06-24 DIAGNOSIS — M25511 Pain in right shoulder: Secondary | ICD-10-CM

## 2019-06-24 DIAGNOSIS — G8929 Other chronic pain: Secondary | ICD-10-CM

## 2019-06-24 DIAGNOSIS — G894 Chronic pain syndrome: Secondary | ICD-10-CM

## 2019-06-24 DIAGNOSIS — M545 Low back pain: Secondary | ICD-10-CM

## 2019-06-24 DIAGNOSIS — Z79899 Other long term (current) drug therapy: Secondary | ICD-10-CM | POA: Insufficient documentation

## 2019-06-24 MED ORDER — OXYCODONE HCL 5 MG PO TABS: 5.0000 mg | ORAL_TABLET | Freq: Four times a day (QID) | ORAL | 0 refills | Status: DC | PRN

## 2019-06-30 NOTE — Progress Notes (Signed)
PROVIDER NOTE: Information contained herein reflects review and annotations entered in association with encounter. Interpretation of such information and data should be left to medically-trained personnel. Information provided to patient can be located elsewhere in the medical record under "Patient Instructions". Document created using STT-dictation technology, any transcriptional errors that may result from process are unintentional.    Patient: Adam Petty.  Service Category: E/M  Provider: Gaspar Cola, MD  DOB: [birth year]  DOS: 07/01/2019  Specialty: Interventional Pain Management  MRN: [id number]  Setting: Ambulatory outpatient  PCP: Zeb Comfort, MD  Type: Established Patient    Referring Provider: Zeb Comfort, MD  Location: Office  Delivery: Face-to-face     HPI  Reason for encounter: [name], a 59 y.o. year old male, is here today for evaluation and management of his Chronic pain syndrome [G89.4]. Adam Petty primary complain today is Medication Refill Last encounter: Practice (04/18/2019). My last encounter with him was on 04/02/2019. 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); Discogenic syndrome, lumbar (WC injury); Osteoarthritis of spine with radiculopathy, lumbar region (WC injury); Lumbar facet syndrome (WC injury) (Bilateral) (R>L); Lumbosacral radiculopathy (WC injury); Pain in right knee (WC injury); Lumbar canal stenosis; Neurogenic pain; Neuropathic pain; Musculoskeletal pain; Lumbar spondylosis; Chronic knee pain (Secondary area of Pain) (Right); Osteoarthritis of knee (Right); Chronic shoulder pain (Third area of Pain) (Right); Osteoarthritis of shoulder (Right); Chronic lower extremity pain (Right); Recurrent nephrolithiasis; Acute medial meniscal tear; Chronic pain  syndrome (WC injury); Acute low back pain without sciatica; Osteoarthritis of glenohumeral joint (Right); Chronic sacroiliac joint pain (Bilateral) (R>L); Spondylosis without myelopathy or radiculopathy, lumbosacral region; DDD (degenerative disc disease), lumbosacral; DDD (degenerative disc disease), thoracic; and Lumbosacral radiculopathy (S1) (Right) on their pertinent problem list. Pain Assessment: Severity of Chronic pain is reported as a 4 /10. Location: Back Mid/Denies. Onset: More than a month ago. Quality: Stabbing, Sharp. Timing: Constant. Modifying factor(s): Oxycodone helps take edge off. Ibuprofen and aleve. heating pads.. Vitals:  height is '5\' 6"'  (1.676 m) and weight is 245 lb (111.1 kg). His temperature is 98 F (36.7 C). His blood pressure is 142/80 (abnormal) and his pulse is 92. His respiration is 18 and oxygen saturation is 99%.    The patient indicates doing well with the current medication regimen. No adverse reactions or side effects reported to the medications.  Today we had a long conversation regarding his weight and the fact that it is adversely affecting his back, hips, and knees.  He indicates that he was pending to have a knee replacement but he was told that he could not have it because he was above there BMI cutoff.  Once again, this goes to prove that he needs work on this.  Today I attempted to give him some incentives and recommended a nutritionist and perhaps some bariatric surgery, but his attitude towards this is that he has tried it in he has not been able to anything about it.  I find this rather difficult to believe.  Today the patient asked me if there would be any long-term problems with him taking Tylenol and Aleve on a daily basis.  When I asked him how much he was taking, he appears to be taken over 6 Tylenols per day as well as 4-6 Aleve's per day.  I pointed out to  him that Tylenol has the potential to cause liver damage and there nonsteroidal anti-inflammatory  drugs will increase his blood pressure, have him retain fluid, and it will be very hard on his kidneys.  In addition to that more recent studies would also suggest cardiovascular issues.  Today I will be ordering a comprehensive metabolic panel to check on kidney and liver function.  Pharmacotherapy Assessment   Analgesic: Oxycodone IR 5 mg, 1 tab PO q 6 hrs (20 mg/day)  MME/day: 30 mg/day.   Monitoring: [place] PMP: PDMP reviewed during this encounter.       Pharmacotherapy: No side-effects or adverse reactions reported. Compliance: No problems identified. Effectiveness: Clinically acceptable.  Adam Napoleon, RN  07/01/2019 10:47 AM  Sign when Signing Visit Nursing Pain Medication Assessment:  Safety precautions to be maintained throughout the outpatient stay will include: orient to surroundings, keep bed in low position, maintain call bell within reach at all times, provide assistance with transfer out of bed and ambulation.  Medication Inspection Compliance: Pill count conducted under aseptic conditions, in front of the patient. Neither the pills nor the bottle was removed from the patient's sight at any time. Once count was completed pills were immediately returned to the patient in their original bottle.  Medication: Oxycodone IR Pill/Patch Count: 28 of 120 pills remain Pill/Patch Appearance: Markings consistent with prescribed medication Bottle Appearance: Standard pharmacy container. Clearly labeled. Filled Date: 05 / 19 / 2021 Last Medication intake:  Has not ran out yet of mediction. Safety precautions to be maintained throughout the outpatient stay will include: orient to surroundings, keep bed in low position, maintain call bell within reach at all times, provide assistance with transfer out of bed and ambulation.     UDS:  Summary  Date Value Ref Range Status  12/07/2017 FINAL  Final    Comment:    ==================================================================== TOXASSURE  SELECT 13 (MW) ==================================================================== Test                             Result       Flag       Units Drug Present and Declared for Prescription Verification   Oxycodone                      977          EXPECTED   ng/mg creat   Oxymorphone                    917          EXPECTED   ng/mg creat   Noroxycodone                   1160         EXPECTED   ng/mg creat   Noroxymorphone                 114          EXPECTED   ng/mg creat    Sources of oxycodone are scheduled prescription medications.    Oxymorphone, noroxycodone, and noroxymorphone are expected    metabolites of oxycodone. Oxymorphone is also available as a    scheduled prescription medication. ==================================================================== Test                      Result    Flag   Units      Ref Range  Creatinine              70               mg/dL      >=20 ==================================================================== Declared Medications:  The flagging and interpretation on this report are based on the  following declared medications.  Unexpected results may arise from  inaccuracies in the declared medications.  **Note: The testing scope of this panel includes these medications:  Oxycodone  **Note: The testing scope of this panel does not include following  reported medications:  Calcium  Cyanocobalamin  Gabapentin  Ibuprofen  Insulin  Ketoconazole  Liraglutide (Victoza)  Metformin  Naproxen  Potassium  Ramipril  Rosuvastatin ==================================================================== For clinical consultation, please call [phone]. ====================================================================      ROS  Constitutional: Denies any fever or chills Gastrointestinal: No reported hemesis, hematochezia, vomiting, or acute GI distress Musculoskeletal: Denies any acute onset joint swelling, redness, loss of ROM, or  weakness Neurological: No reported episodes of acute onset apraxia, aphasia, dysarthria, agnosia, amnesia, paralysis, loss of coordination, or loss of consciousness  Medication Review  Semaglutide(0.25 or 0.5MG/DOS), Tapentadol HCl, Testosterone, cyanocobalamin, gabapentin, insulin aspart, insulin degludec, ketoconazole, metFORMIN, naproxen sodium, oxyCODONE, potassium citrate, ramipril, and rosuvastatin  History Review  Allergy: Adam Petty has No Known Allergies. Drug: Adam Petty  reports no history of drug use. Alcohol:  reports current alcohol use. Tobacco:  reports that he has never smoked. He has never used smokeless tobacco. Social: Adam Petty  reports that he has never smoked. He has never used smokeless tobacco. He reports current alcohol use. He reports that he does not use drugs. 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 Surgical: Adam Petty  has a past surgical history that includes Tonsillectomy; Shoulder surgery; Palate surgery; Cervical fusion; Foot surgery; Lumbar laminectomy/decompression microdiscectomy (N/A, 02/06/2013); Knee arthroscopy (Right, 10/02/2013); Knee arthroscopy with medial menisectomy (Right, 03/25/2015); Knee bursectomy (Right, 03/25/2015); Cystoscopy w/ retrogrades (Right, 11/19/2015); Cystoscopy w/ retrogrades (Right, 10/03/2017); Ureteroscopy (Right, 10/03/2017); and CTR (Left, 02/27/2018). Family: family history includes COPD in his mother; Cancer in his mother; Diabetes in his father; Heart disease in his father and mother.  Laboratory Chemistry Profile   Renal Lab Results  Component Value Date   BUN 12 10/02/2017   CREATININE 1.06 10/02/2017   GFRAA >60 10/02/2017   GFRNONAA >60 10/02/2017      Hepatic Lab Results  Component Value Date   AST 41 03/12/2015   ALT 55 03/12/2015   ALBUMIN 4.2 03/12/2015   ALKPHOS 89 03/12/2015     Electrolytes Lab Results  Component Value Date   NA 137 10/02/2017   K 4.2 10/02/2017   CL 103 10/02/2017   CALCIUM 8.8 (L) 10/02/2017   MG 1.8 03/12/2015     Bone No results found for: VD25OH, VD125OH2TOT, OQ9476LY6, [id number], 25OHVITD1, 25OHVITD2, 25OHVITD3, TESTOFREE, TESTOSTERONE   Inflammation (CRP: Acute Phase) (ESR: Chronic Phase) Lab Results  Component Value Date   CRP 0.6 03/12/2015   ESRSEDRATE 9 03/12/2015       Note: Above Lab results reviewed.  Recent Imaging Review  DG PAIN CLINIC C-ARM 1-60 MIN NO REPORT Fluoro was used, but no Radiologist interpretation will be provided.  Please refer to "NOTES" tab for provider progress note. Note: Reviewed  Physical Exam  General appearance: Well nourished, well developed, and well hydrated. In no apparent acute distress Mental status: Alert, oriented x 3 (person, place, & time)       Respiratory: No evidence of acute respiratory distress Eyes: PERLA Vitals: BP (!) 142/80 (BP Location: Right Arm, Patient Position: Sitting, Cuff Size: Large)   Pulse 92   Temp 98 F (36.7 C)   Resp 18   Ht '5\' 6"'  (1.676 m)   Wt 245 lb (111.1 kg)   SpO2 99%   BMI 39.54 kg/m  BMI: Estimated body mass index is 39.54 kg/m as calculated from the following:   Height as of this encounter: '5\' 6"'  (1.676 m).   Weight as of this encounter: 245 lb (111.1 kg). Ideal: Ideal body weight: 63.8 kg (140 lb 10.5 oz) Adjusted ideal body weight: 82.7 kg (182 lb 6.3 oz)  Assessment   Status Diagnosis  Controlled Worsening Worsening 1. Chronic pain syndrome (WC injury)   2. Chronic low back pain (WC injury) (Primary Area of Pain) (Bilateral) (R>L)   3. Chronic knee pain (Secondary area of Pain) (Right)   4. Chronic shoulder pain (Third area of Pain) (Right)   5. 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 Pharmacologic therapy   7. Neurogenic pain   8. Chronic pain syndrome      Updated Problems: No problems updated.  Plan of Care  Problem-specific:  No problem-specific Assessment & Plan notes found for this encounter.  Adam Petty. has a current medication list which includes the following long-term medication(s): testosterone, gabapentin, [START ON 07/05/2019] oxycodone, [START ON 08/04/2019] oxycodone, and [START ON 09/03/2019] oxycodone.  Pharmacotherapy (Medications Ordered): Meds ordered this encounter  Medications  . gabapentin (NEURONTIN) 600 MG tablet    Sig: Take 1 tablet (600 mg total) by mouth every 6 (six) hours.    Dispense:  120 tablet    Refill:  5    Fill one day early if pharmacy is closed on scheduled refill date. May substitute for generic if available.  Marland Kitchen oxyCODONE (OXY IR/ROXICODONE) 5 MG immediate release tablet    Sig: Take 1 tablet (5 mg total) by mouth every 6 (six) hours as needed for severe pain. Must last 30 days    Dispense:  120 tablet    Refill:  0    Chronic Pain: STOP Act (Not applicable) Fill 1 day early if closed on refill date. Do not fill until: 07/05/2019. To last until: 08/04/2019. Avoid benzodiazepines within 8 hours of opioids  . oxyCODONE (OXY IR/ROXICODONE) 5 MG immediate release tablet    Sig: Take 1 tablet (5 mg total) by mouth every 6 (six) hours as needed for severe pain. Must last 30 days    Dispense:  120 tablet    Refill:  0    Chronic Pain: STOP Act (Not applicable) Fill 1 day early if closed on refill date. Do not fill until: 08/04/2019. To last until: 09/03/2019. Avoid benzodiazepines within 8 hours of opioids  . oxyCODONE (OXY IR/ROXICODONE) 5 MG immediate release tablet    Sig: Take 1 tablet (5 mg total) by mouth every 6 (six) hours as needed for severe pain. Must last 30 days    Dispense:  120 tablet    Refill:  0    Chronic Pain: STOP Act (Not applicable) Fill 1 day early if closed on refill date. Do not  fill until: 09/03/2019. To last until: 10/03/2019. Avoid benzodiazepines within  8 hours of opioids   Orders:  Orders Placed This Encounter  Procedures  . ToxASSURE Select 13 (MW), Urine    Volume: 30 ml(s). Minimum 3 ml of urine is needed. Document temperature of fresh sample. Indications: Long term (current) use of opiate analgesic (M57.846)    Order Specific Question:   Release to patient    Answer:   Immediate  . Comp. Metabolic Panel (12)    With GFR. Indications: Chronic Pain Syndrome (G89.4) & Pharmacotherapy (N62.952)    Order Specific Question:   Has the patient fasted?    Answer:   No    Order Specific Question:   CC Results    Answer:   PCP-NURSE [[id number]]    Order Specific Question:   Release to patient    Answer:   Immediate   Follow-up plan:   Return in about 3 months (around 10/02/2019) for (F2F), (MM).      Interventional treatment options: Planned, scheduled, and/or pending:   Diagnostic right caudal ESI #1 + diagnostic epidurogram #1 under fluoroscopic guidance and IV sedation.  The patient's last MRI was done in 2016 at a [facility] institution.  He continues to have pain that he describes as slowly worsening.  He seems to have 2 sources for this pain.  There is the pain in his back which clearly is coming from his facet joints, but there is also pain down his right lower extremity that seems to be an S1 lumbosacral sensory radiculopathy.  Since he does have a prior history of back surgery, it is very possible that this may have something to do with epidural fibrosis.  We will be scheduling the patient to come in for a diagnostic right-sided caudal ESI + epidurogram under fluoroscopic guidance and IV sedation to determine if this is the case.  Satiate 21   Under consideration:   Diagnostic right caudal ESI #1 + diagnostic epidurogram #1  Possible right Suprascapular nerveRFA Possible left lumbar facet RFA  Diagnostic right knee genicular nerve block.  Possible right knee  genicular nerve RFA   Therapeutic/palliative (PRN):   Palliative right lumbar facet block #4  Palliative left lumbar facet block #3  Palliative right lumbar facet RFA #2  (Right side done 04/13/2016) (100% relief) Diagnostic right Suprascapular nerve Block#2      Recent Visits Date Type Provider Dept  04/17/19 Telemedicine Milinda Pointer, MD [facility]  04/02/19 Procedure visit Milinda Pointer, MD [facility]  Showing recent visits within past 90 days and meeting all other requirements Today's Visits Date Type Provider Dept  07/01/19 Office Visit Milinda Pointer, MD [facility]  Showing today's visits and meeting all other requirements Future Appointments No visits were found meeting these conditions. Showing future appointments within next 90 days and meeting all other requirements  I discussed the assessment and treatment plan with the patient. The patient was provided an opportunity to ask questions and all were answered. The patient agreed with the plan and demonstrated an understanding of the instructions.  Patient advised to call back or seek an in-person evaluation if the symptoms or condition worsens.  Duration of encounter: 30 minutes.  Note by: Gaspar Cola, MD Date: 07/01/2019; Time: 11:23 AM

## 2019-07-01 ENCOUNTER — Other Ambulatory Visit
Admission: RE | Admit: 2019-07-01 | Discharge: 2019-07-01 | Disposition: A | Discharge status: Home / Self Care | Payer: BC Managed Care – PPO | Source: Ambulatory Visit | Attending: Pain Medicine | Admitting: Pain Medicine

## 2019-07-01 ENCOUNTER — Ambulatory Visit (HOSPITAL_BASED_OUTPATIENT_CLINIC_OR_DEPARTMENT_OTHER): Payer: BC Managed Care – PPO | Admitting: Pain Medicine

## 2019-07-01 ENCOUNTER — Other Ambulatory Visit: Payer: Self-pay

## 2019-07-01 ENCOUNTER — Encounter: Payer: Self-pay | Admitting: Pain Medicine

## 2019-07-01 VITALS — BP 142/80 | HR 92 | Temp 98.0°F | Resp 18 | Ht 66.0 in | Wt 245.0 lb

## 2019-07-01 DIAGNOSIS — K589 Irritable bowel syndrome without diarrhea: Secondary | ICD-10-CM | POA: Insufficient documentation

## 2019-07-01 DIAGNOSIS — M5137 Other intervertebral disc degeneration, lumbosacral region: Secondary | ICD-10-CM | POA: Insufficient documentation

## 2019-07-01 DIAGNOSIS — Z79899 Other long term (current) drug therapy: Secondary | ICD-10-CM

## 2019-07-01 DIAGNOSIS — M545 Low back pain: Secondary | ICD-10-CM | POA: Insufficient documentation

## 2019-07-01 DIAGNOSIS — G8929 Other chronic pain: Secondary | ICD-10-CM

## 2019-07-01 DIAGNOSIS — M5134 Other intervertebral disc degeneration, thoracic region: Secondary | ICD-10-CM | POA: Diagnosis not present

## 2019-07-01 DIAGNOSIS — M25561 Pain in right knee: Secondary | ICD-10-CM

## 2019-07-01 DIAGNOSIS — M48061 Spinal stenosis, lumbar region without neurogenic claudication: Secondary | ICD-10-CM | POA: Diagnosis not present

## 2019-07-01 DIAGNOSIS — Z79891 Long term (current) use of opiate analgesic: Secondary | ICD-10-CM | POA: Insufficient documentation

## 2019-07-01 DIAGNOSIS — Z791 Long term (current) use of non-steroidal anti-inflammatories (NSAID): Secondary | ICD-10-CM | POA: Diagnosis not present

## 2019-07-01 DIAGNOSIS — M961 Postlaminectomy syndrome, not elsewhere classified: Secondary | ICD-10-CM

## 2019-07-01 DIAGNOSIS — Z87442 Personal history of urinary calculi: Secondary | ICD-10-CM | POA: Diagnosis not present

## 2019-07-01 DIAGNOSIS — M1711 Unilateral primary osteoarthritis, right knee: Secondary | ICD-10-CM | POA: Diagnosis not present

## 2019-07-01 DIAGNOSIS — G894 Chronic pain syndrome: Secondary | ICD-10-CM | POA: Insufficient documentation

## 2019-07-01 DIAGNOSIS — Z981 Arthrodesis status: Secondary | ICD-10-CM | POA: Insufficient documentation

## 2019-07-01 DIAGNOSIS — Z833 Family history of diabetes mellitus: Secondary | ICD-10-CM | POA: Diagnosis not present

## 2019-07-01 DIAGNOSIS — M25511 Pain in right shoulder: Secondary | ICD-10-CM

## 2019-07-01 DIAGNOSIS — Z794 Long term (current) use of insulin: Secondary | ICD-10-CM | POA: Diagnosis not present

## 2019-07-01 DIAGNOSIS — E119 Type 2 diabetes mellitus without complications: Secondary | ICD-10-CM | POA: Diagnosis not present

## 2019-07-01 DIAGNOSIS — M4726 Other spondylosis with radiculopathy, lumbar region: Secondary | ICD-10-CM | POA: Insufficient documentation

## 2019-07-01 DIAGNOSIS — M792 Neuralgia and neuritis, unspecified: Secondary | ICD-10-CM

## 2019-07-01 LAB — COMPREHENSIVE METABOLIC PANEL
ALT: 34 U/L (ref 0–44)
AST: 26 U/L (ref 15–41)
Albumin: 4.2 g/dL (ref 3.5–5.0)
Alkaline Phosphatase: 97 U/L (ref 38–126)
Anion gap: 9 (ref 5–15)
BUN: 19 mg/dL (ref 6–20)
CO2: 25 mmol/L (ref 22–32)
Calcium: 8.9 mg/dL (ref 8.9–10.3)
Chloride: 106 mmol/L (ref 98–111)
Creatinine, Ser: 1.08 mg/dL (ref 0.61–1.24)
GFR calc Af Amer: >60 mL/min (ref >60)
GFR calc non Af Amer: >60 mL/min (ref >60)
Glucose, Bld: 142 mg/dL — ABNORMAL HIGH (ref 70–99)
Potassium: 4.8 mmol/L (ref 3.5–5.1)
Sodium: 140 mmol/L (ref 135–145)
Total Bilirubin: 0.5 mg/dL (ref 0.3–1.2)
Total Protein: 7.3 g/dL (ref 6.5–8.1)

## 2019-07-01 MED ORDER — OXYCODONE HCL 5 MG PO TABS: 5.0000 mg | ORAL_TABLET | Freq: Four times a day (QID) | ORAL | 0 refills | Status: DC | PRN

## 2019-07-01 MED ORDER — GABAPENTIN 600 MG PO TABS: 600.0000 mg | ORAL_TABLET | Freq: Four times a day (QID) | ORAL | 5 refills | Status: DC

## 2019-07-01 NOTE — Patient Instructions (Addendum)
______________________________________________________________________________________________  Weight Management Required  URGENT: Your weight has been found to be adversely affecting your health.  Dear [name]:  Your current Estimated body mass index is 39.54 kg/m as calculated from the following:   Height as of this encounter: '5\' 6"'  (1.676 m).   Weight as of this encounter: 245 lb (111.1 kg).  Please use the table below to identify your weight category and associated incidence of chronic pain, secondary to your weight.  Body Mass Index (BMI) Classification BMI level (kg/m2) Category Associated incidence of chronic pain  <18  Underweight   18.5-24.9 Ideal body weight   25-29.9 Overweight  20%  30-34.9 Obese (Class I)  68%  35-39.9 Severe obesity (Class II)  136%  >40 Extreme obesity (Class III)  254%   In addition: You will be considered "Morbidly Obese", if your BMI is above 30 and you have one or more of the following conditions which are known to be caused and/or directly associated with obesity: 1.    Type 2 Diabetes (Which in turn can lead to cardiovascular diseases (CVD), stroke, peripheral vascular diseases (PVD), retinopathy, nephropathy, and neuropathy) 2.    Cardiovascular Disease (High Blood Pressure; Congestive Heart Failure; High Cholesterol; Coronary Artery Disease; Angina; or History of Heart Attacks) 3.    Breathing problems (Asthma; obesity-hypoventilation syndrome; obstructive sleep apnea; chronic inflammatory airway disease; reactive airway disease; or shortness of breath) 4.    Chronic kidney disease 5.    Liver disease (nonalcoholic fatty liver disease) 6.    High blood pressure 7.    Acid reflux (gastroesophageal reflux disease; heartburn) 8.    Osteoarthritis (OA) (with any of the following: hip pain; knee pain; and/or low back pain) 9.    Low back pain (Lumbar Facet Syndrome; and/or Degenerative Disc Disease) 10.  Hip pain (Osteoarthritis of hip) (For  every 1 lbs of added body weight, there is a 2 lbs increase in pressure inside of each hip articulation. 1:2 mechanical relationship) 11.  Knee pain (Osteoarthritis of knee) (For every 1 lbs of added body weight, there is a 4 lbs increase in pressure inside of each knee articulation. 1:4 mechanical relationship) (patients with a BMI>30 kg/m2 were 6.8 times more likely to develop knee OA than normal-weight individuals) 12.  Cancer: Epidemiological studies have shown that obesity is a risk factor for: post-menopausal breast cancer; cancers of the endometrium, colon and kidney cancer; malignant adenomas of the oesophagus. Obese subjects have an approximately 1.5-3.5-fold increased risk of developing these cancers compared with normal-weight subjects, and it has been estimated that between 15 and 45% of these cancers can be attributed to overweight. More recent studies suggest that obesity may also increase the risk of other types of cancer, including pancreatic, hepatic and gallbladder cancer. (Ref: Obesity and cancer. Pischon T, Nthlings U, [employer] H. Proc Nutr Soc. 2008 May;67(2):128-45. doi: 70.6237/S2831517616073710.) The International Agency for Research on Cancer (IARC) has identified 13 cancers associated with overweight and obesity: meningioma, multiple myeloma, adenocarcinoma of the esophagus, and cancers of the thyroid, postmenopausal breast cancer, gallbladder, stomach, liver, pancreas, kidney, ovaries, uterus, colon and rectal (colorectal) cancers. 52 percent of all cancers diagnosed in women and 24 percent of those diagnosed in men are associated with overweight and obesity.  Recommendation: At this point it is urgent that you take a step back and concentrate in loosing weight. Dedicate 100% of your efforts on this task. Nothing else will improve your health more than bringing your weight down and your BMI  to less than 30. If you are here, you probably have chronic pain. Because most chronic pain  patients have difficulty exercising secondary to their pain, you must rely on proper nutrition and diet in order to lose the weight. If your BMI is above 40, you should seriously consider bariatric surgery. A realistic goal is to lose 10% of your body weight over a period of 12 months.  Be honest to yourself, if over time you have unsuccessfully tried to lose weight, then it is time for you to seek professional help and to enter a medically supervised weight management program, and/or undergo bariatric surgery. Stop procrastinating.   Pain management considerations:  1.    Pharmacological Problems: Be advised that the use of opioid analgesics (oxycodone; hydrocodone; morphine; methadone; codeine; and all of their derivatives) have been associated with decreased metabolism and weight gain.  For this reason, should we see that you are unable to lose weight while taking these medications, it may become necessary for [place] to taper down and indefinitely discontinue them.  2.    Technical Problems: The incidence of successful interventional therapies decreases as the patient's BMI increases. It is much more difficult to accomplish a safe and effective interventional therapy on a patient with a BMI above 35. 3.    Radiation Exposure Problems: The x-rays machine, used to accomplish injection therapies, will automatically increase their x-ray output in order to capture an appropriate bone image. This means that radiation exposure increases exponentially with the patient's BMI. (The higher the BMI, the higher the radiation exposure.) Although the level of radiation used at a given time is still safe to the patient, it is not for the physician and/or assisting staff. Unfortunately, radiation exposure is accumulative. Because physicians and the staff have to do procedures and be exposed on a daily basis, this can result in health problems such as cancer and radiation burns. Radiation exposure to the staff is monitored by the  radiation batches that they wear. The exposure levels are reported back to the staff on a quarterly basis. Depending on levels of exposure, physicians and staff may be obligated by law to decrease this exposure. This means that they have the right and obligation to refuse providing therapies where they may be overexposed to radiation. For this reason, physicians may decline to offer therapies such as radiofrequency ablation or implants to patients with a BMI above 40. 4.    Current Trends: Be advised that the current trend is to no longer offer certain therapies to patients with a BMI equal to, or above 35, due to increase perioperative risks, increased technical procedural difficulties, and excessive radiation exposure to healthcare personnel.  ______________________________________________________________________________________________    ____________________________________________________________________________________________  Pain Management Weight Control Diet   Note: Before starting this diet, make sure to talk to your primary care physician to be sure it is safe for you.   BMI:  Your current Estimated body mass index is 39.54 kg/m as calculated from the following:   Height as of this encounter: '5\' 6"'  (1.676 m).   Weight as of this encounter: 245 lb (111.1 kg).  Breakfast:   Drink 12 oz of cold water 15-30 minutes prior to meal  1 boiled or pouched egg. You may use the egg white ready-made preparations.  1 wheat low calorie toast.   8 oz. black coffee without milk, creamer, or any type of sweetener. (If a sweetener must be used, then use stevia).   Lunch & Dinner:   Drink  12-24 oz. of ice water, 30 minutes prior to meal  5 oz. Lean Protein like chicken, fish, or lean meat (above 95% fat free).   No cold cuts or processed meats. (No red meat)  Steamed, baked or grilled. (Never fried).  No oils, fats, lard, or butter.   1 cup of steamed vegetables, or 1.5 cups if raw.    1 serving of salad or greens (No dressings, except vinegar or lemon juice).   Mid-day & Mid-afternoon snack:   1 fruit. No bananas. (maximum of 2 fruits per day)   Important Rules:  1. Drink water. Must drink 100 oz. or more of water per day.   Consult your Primary Care Physician if you have a history of kidney failure, or congestive heart failure before doing this.  2. Take calcium and magnesium every day to avoid night cramps.   Consult your Primary Care Physician if you have a history of kidney failure, hypercalcemia, or parathyroid problems before doing this.   Over-the-counter calcium 1200 mg/day (in the morning). Take with Vitamin D 5000 IU every day.   Over-the-counter magnesium 400 to 500 mg per day (1-2 hours prior to bedtime).  3. Avoid salt. Never add any salt and avoid salty foods. Do not take more than 2,300 mg/day. 4. Avoid eating anything after 6:00 pm.  5. Weight yourself every morning at the same time and record weight on a notebook.  6. Mix Benefiber 3 to 5 table spoons in water and drink before each meals.  7. No sodas.  8. No alcohol.  9. No sugar.  10. No artificial sweeteners.   Stevia without sugar is the only sweetener aloud.  11. No bread except for the one breakfast toast.   Low calorie wheat bread.  12. Duration of diet: 2 weeks at a time with 2 days rest, then repeat, until BMI is less than 30.  13. Restrict food volume. When feeding restrict the total volume of food that you eat at a meal. Never eat more than what you can hold in the palm of one hand. 14. Eat very slow. Chew food twenty (20) times before swallowing. 15. Restrict amount of times that you eat. Do not feed any sooner than every 4 hours. 16. Do not over-eat or over-indulge yourself in the 2 days of rest from the diet.  17. Follow ancient [place] customs. Have your meals sitting on the floor.  You will notice that your stomach will be creating an increased abdominal pressure that  will not allow you to overeat. (This last one may be difficult or impossible for most chronic pain patients).     ____________________________________________________________________________________________    ____________________________________________________________________________________________  Medication Rules  Purpose: To inform patients, and their family members, of our rules and regulations.  Applies to: All patients receiving prescriptions (written or electronic).  Pharmacy of record: Pharmacy where electronic prescriptions will be sent. If written prescriptions are taken to a different pharmacy, please inform the nursing staff. The pharmacy listed in the electronic medical record should be the one where you would like electronic prescriptions to be sent.  Electronic prescriptions: In compliance with the [place] (STOP) Act of 2017 (Session Lanny Cramp [phone]), effective January 17, 2018, all controlled substances must be electronically prescribed. Calling prescriptions to the pharmacy will cease to exist.  Prescription refills: Only during scheduled appointments. Applies to all prescriptions.  NOTE: The following applies primarily to controlled substances (Opioid* Pain Medications).   Type of encounter (  visit): For patients receiving controlled substances, face-to-face visits are required. (Not an option or up to the patient.)  Patient's responsibilities: 1. Pain Pills: Bring all pain pills to every appointment (except for procedure appointments). 2. Pill Bottles: Bring pills in original pharmacy bottle. Always bring the newest bottle. Bring bottle, even if empty. 3. Medication refills: You are responsible for knowing and keeping track of what medications you take and those you need refilled. The day before your appointment: write a list of all prescriptions that need to be refilled. The day of the appointment: give the list to the  admitting nurse. Prescriptions will be written only during appointments. No prescriptions will be written on procedure days. If you forget a medication: it will not be "Called in", "Faxed", or "electronically sent". You will need to get another appointment to get these prescribed. No early refills. Do not call asking to have your prescription filled early. 4. Prescription Accuracy: You are responsible for carefully inspecting your prescriptions before leaving our office. Have the discharge nurse carefully go over each prescription with you, before taking them home. Make sure that your name is accurately spelled, that your address is correct. Check the name and dose of your medication to make sure it is accurate. Check the number of pills, and the written instructions to make sure they are clear and accurate. Make sure that you are given enough medication to last until your next medication refill appointment. 5. Taking Medication: Take medication as prescribed. When it comes to controlled substances, taking less pills or less frequently than prescribed is permitted and encouraged. Never take more pills than instructed. Never take medication more frequently than prescribed.  6. Inform other Doctors: Always inform, all of your healthcare providers, of all the medications you take. 7. Pain Medication from other Providers: You are not allowed to accept any additional pain medication from any other Doctor or Healthcare provider. There are two exceptions to this rule. (see below) In the event that you require additional pain medication, you are responsible for notifying us, as stated below. 8. Medication Agreement: You are responsible for carefully reading and following our Medication Agreement. This must be signed before receiving any prescriptions from our practice. Safely store a copy of your signed Agreement. Violations to the Agreement will result in no further prescriptions. (Additional copies of our  Medication Agreement are available upon request.) 9. Laws, Rules, & Regulations: All patients are expected to follow all Federal and [employer], [employer], Rules, [employer]. Ignorance of the Laws does not constitute a valid excuse.  10. Illegal drugs and Controlled Substances: The use of illegal substances (including, but not limited to marijuana and its derivatives) and/or the illegal use of any controlled substances is strictly prohibited. Violation of this rule may result in the immediate and permanent discontinuation of any and all prescriptions being written by our practice. The use of any illegal substances is prohibited. 11. Adopted CDC guidelines & recommendations: Target dosing levels will be at or below 60 MME/day. Use of benzodiazepines** is not recommended.  Exceptions: There are only two exceptions to the rule of not receiving pain medications from other Healthcare Providers. 18. Exception #1 (Emergencies): In the event of an emergency (i.e.: accident requiring emergency care), you are allowed to receive additional pain medication. However, you are responsible for: As soon as you are able, call our office [phone], at any time of the day or night, and leave a message stating your name, the date and nature of  the emergency, and the name and dose of the medication prescribed. In the event that your call is answered by a member of our staff, make sure to document and save the date, time, and the name of the person that took your information.  19. Exception #2 (Planned Surgery): In the event that you are scheduled by another doctor or dentist to have any type of surgery or procedure, you are allowed (for a period no longer than 30 days), to receive additional pain medication, for the acute post-op pain. However, in this case, you are responsible for picking up a copy of our "Post-op Pain Management for Surgeons" handout, and giving it to your surgeon or dentist. This document is available at  our office, and does not require an appointment to obtain it. Simply go to our office during business hours (Monday-Thursday from 8:00 AM to 4:00 PM) (Friday 8:00 AM to 12:00 Noon) or if you have a scheduled appointment with [place], prior to your surgery, and ask for it by name. In addition, you will need to provide [place] with your name, name of your surgeon, type of surgery, and date of procedure or surgery.  *Opioid medications include: morphine, codeine, oxycodone, oxymorphone, hydrocodone, hydromorphone, meperidine, tramadol, tapentadol, buprenorphine, fentanyl, methadone. **Benzodiazepine medications include: diazepam (Valium), alprazolam (Xanax), clonazepam (Klonopine), lorazepam (Ativan), clorazepate (Tranxene), chlordiazepoxide (Librium), estazolam (Prosom), oxazepam (Serax), temazepam (Restoril), triazolam (Halcion) (Last updated: 03/16/2017) ____________________________________________________________________________________________   ____________________________________________________________________________________________  Medication Recommendations and Reminders  Applies to: All patients receiving prescriptions (written and/or electronic).  Medication Rules & Regulations: These rules and regulations exist for your safety and that of others. They are not flexible and neither are we. Dismissing or ignoring them will be considered "non-compliance" with medication therapy, resulting in complete and irreversible termination of such therapy. (See document titled "Medication Rules" for more details.) In all conscience, because of safety reasons, we cannot continue providing a therapy where the patient does not follow instructions.  Pharmacy of record:   Definition: This is the pharmacy where your electronic prescriptions will be sent.   We do not endorse any particular pharmacy.  You are not restricted in your choice of pharmacy.  The pharmacy listed in the electronic medical record should  be the one where you want electronic prescriptions to be sent.  If you choose to change pharmacy, simply notify our nursing staff of your choice of new pharmacy.  Recommendations:  Keep all of your pain medications in a safe place, under lock and key, even if you live alone.   After you fill your prescription, take 1 week's worth of pills and put them away in a safe place. You should keep a separate, properly labeled bottle for this purpose. The remainder should be kept in the original bottle. Use this as your primary supply, until it runs out. Once it's gone, then you know that you have 1 week's worth of medicine, and it is time to come in for a prescription refill. If you do this correctly, it is unlikely that you will ever run out of medicine.  To make sure that the above recommendation works, it is very important that you make sure your medication refill appointments are scheduled at least 1 week before you run out of medicine. To do this in an effective manner, make sure that you do not leave the office without scheduling your next medication management appointment. Always ask the nursing staff to show you in your prescription , when your medication will be running out.  Then arrange for the receptionist to get you a return appointment, at least 7 days before you run out of medicine. Do not wait until you have 1 or 2 pills left, to come in. This is very poor planning and does not take into consideration that we may need to cancel appointments due to bad weather, sickness, or emergencies affecting our staff.  "Partial Fill": If for any reason your pharmacy does not have enough pills/tablets to completely fill or refill your prescription, do not allow for a "partial fill". You will need a separate prescription to fill the remaining amount, which we will not provide. If the reason for the partial fill is your insurance, you will need to talk to the pharmacist about payment alternatives for the remaining  tablets, but again, do not accept a partial fill.  Prescription refills and/or changes in medication(s):   Prescription refills, and/or changes in dose or medication, will be conducted only during scheduled medication management appointments. (Applies to both, written and electronic prescriptions.)  No refills on procedure days. No medication will be changed or started on procedure days. No changes, adjustments, and/or refills will be conducted on a procedure day. Doing so will interfere with the diagnostic portion of the procedure.  No phone refills. No medications will be "called into the pharmacy".  No Fax refills.  No weekend refills.  No Holliday refills.  No after hours refills.  Remember:  Business hours are:  Monday to Thursday 8:00 AM to 4:00 PM Provider's Schedule: Milinda Pointer, MD - Appointments are:  Medication management: Monday and Wednesday 8:00 AM to 4:00 PM Procedure day: Tuesday and Thursday 7:30 AM to 4:00 PM Gillis Santa, MD - Appointments are:  Medication management: Tuesday and Thursday 8:00 AM to 4:00 PM Procedure day: Monday and Wednesday 7:30 AM to 4:00 PM (Last update: 03/16/2017) ____________________________________________________________________________________________   ____________________________________________________________________________________________  CANNABIDIOL (AKA: CBD Oil or Pills)  Applies to: All patients receiving prescriptions of controlled substances (written and/or electronic).  General Information: Cannabidiol (CBD) was discovered in 36. It is one of some 113 identified cannabinoids in cannabis (Marijuana) plants, accounting for up to 40% of the plant's extract. As of 2018, preliminary clinical research on cannabidiol included studies of anxiety, cognition, movement disorders, and pain.  Cannabidiol is consummed in multiple ways, including inhalation of cannabis smoke or vapor, as an aerosol spray into the cheek, and  by mouth. It may be supplied as CBD oil containing CBD as the active ingredient (no added tetrahydrocannabinol (THC) or terpenes), a full-plant CBD-dominant hemp extract oil, capsules, dried cannabis, or as a liquid solution. CBD is thought not have the same psychoactivity as THC, and may affect the actions of THC. Studies suggest that CBD may interact with different biological targets, including cannabinoid receptors and other neurotransmitter receptors. As of 2018 the mechanism of action for its biological effects has not been determined.  In the [place], cannabidiol has a limited approval by the Food and Drug Administration (FDA) for treatment of only two types of epilepsy disorders. The side effects of long-term use of the drug include somnolence, decreased appetite, diarrhea, fatigue, malaise, weakness, sleeping problems, and others.  CBD remains a Schedule I drug prohibited for any use.  Legality: Some manufacturers ship CBD products nationally, an illegal action which the FDA has not enforced in 2018, with CBD remaining the subject of an FDA investigational new drug evaluation, and is not considered legal as a dietary supplement or food ingredient as of December 2018. Federal  illegality has made it difficult historically to conduct research on CBD. CBD is openly sold in head shops and health food stores in some states where such sales have not been explicitly legalized.  Warning: Because it is not FDA approved for general use or treatment of pain, it is not required to undergo the same manufacturing controls as prescription drugs.  This means that the available cannabidiol (CBD) may be contaminated with THC.  If this is the case, it will trigger a positive urine drug screen (UDS) test for cannabinoids (Marijuana).  Because a positive UDS for illicit substances is a violation of our medication agreement, your opioid analgesics (pain medicine) may be permanently discontinued. (Last update:  04/06/2017) ____________________________________________________________________________________________   ____________________________________________________________________________________________  Drug Holidays (Slow)  What is a "Drug Holiday"? Drug Holiday: is the name given to the period of time during which a patient stops taking a medication(s) for the purpose of eliminating tolerance to the drug.  Benefits  Improved effectiveness of opioids.  Decreased opioid dose needed to achieve benefits.  Improved pain with lesser dose.  What is tolerance? Tolerance: is the progressive decreased in effectiveness of a drug due to its repetitive use. With repetitive use, the body gets use to the medication and as a consequence, it loses its effectiveness. This is a common problem seen with opioid pain medications. As a result, a larger dose of the drug is needed to achieve the same effect that used to be obtained with a smaller dose.  How long should a "Drug Holiday" last? You should stay off of the pain medicine for at least 14 consecutive days. (2 weeks)  Should I stop the medicine "cold [place]"? No. You should always coordinate with your Pain Specialist so that he/she can provide you with the correct medication dose to make the transition as smoothly as possible.  How do I stop the medicine? Slowly. You will be instructed to decrease the daily amount of pills that you take by one (1) pill every seven (7) days. This is called a "slow downward taper" of your dose. For example: if you normally take four (4) pills per day, you will be asked to drop this dose to three (3) pills per day for seven (7) days, then to two (2) pills per day for seven (7) days, then to one (1) per day for seven (7) days, and at the end of those last seven (7) days, this is when the "Drug Holiday" would start.   Will I have withdrawals? By doing a "slow downward taper" like this one, it is unlikely that you will  experience any significant withdrawal symptoms. Typically, what triggers withdrawals is the sudden stop of a high dose opioid therapy. Withdrawals can usually be avoided by slowly decreasing the dose over a prolonged period of time.  What are withdrawals? Withdrawals: refers to the wide range of symptoms that occur after stopping or dramatically reducing opiate drugs after heavy and prolonged use. Withdrawal symptoms do not occur to patients that use low dose opioids, or those who take the medication sporadically. Contrary to benzodiazepine (example: Valium, Xanax, etc.) or alcohol withdrawals (Delirium Tremens), opioid withdrawals are not lethal. Withdrawals are the physical manifestation of the body getting rid of the excess receptors.  Expected Symptoms Early symptoms of withdrawal may include:  Agitation  Anxiety  Muscle aches  Increased tearing  Insomnia  Runny nose  Sweating  Yawning  Late symptoms of withdrawal may include:  Abdominal cramping  Diarrhea  Dilated pupils  Goose bumps  Nausea  Vomiting  Will I experience withdrawals? Due to the slow nature of the taper, it is very unlikely that you will experience any.  What is a slow taper? Taper: refers to the gradual decrease in dose.  ___________________________________________________________________________________________

## 2019-07-01 NOTE — Progress Notes (Signed)
Nursing Pain Medication Assessment:  Safety precautions to be maintained throughout the outpatient stay will include: orient to surroundings, keep bed in low position, maintain call bell within reach at all times, provide assistance with transfer out of bed and ambulation.  Medication Inspection Compliance: Pill count conducted under aseptic conditions, in front of the patient. Neither the pills nor the bottle was removed from the patient's sight at any time. Once count was completed pills were immediately returned to the patient in their original bottle.  Medication: Oxycodone IR Pill/Patch Count: 28 of 120 pills remain Pill/Patch Appearance: Markings consistent with prescribed medication Bottle Appearance: Standard pharmacy container. Clearly labeled. Filled Date: 05 / 19 / 2021 Last Medication intake:  Has not ran out yet of mediction. Safety precautions to be maintained throughout the outpatient stay will include: orient to surroundings, keep bed in low position, maintain call bell within reach at all times, provide assistance with transfer out of bed and ambulation.

## 2019-07-06 LAB — TOXASSURE SELECT 13 (MW), URINE

## 2019-08-05 ENCOUNTER — Telehealth: Payer: Self-pay | Admitting: *Deleted

## 2019-08-05 NOTE — Telephone Encounter (Signed)
Insurance requires him to use [facility]. They do not currently have the Oxycodone, but [facility] does. Will you send it there? Insurance also requires non opioid meds to be written for 90 days. The Gabapentin is written for 30 days with 5 RF, he needs 90 days.

## 2019-08-05 NOTE — Telephone Encounter (Signed)
The patient called back to see why it hasn't been called out yet.

## 2019-08-05 NOTE — Telephone Encounter (Signed)
Patient informed that I have sent a message to Dr. Laban Emperor, he has not yet responded.

## 2019-08-06 ENCOUNTER — Telehealth: Payer: Self-pay | Admitting: *Deleted

## 2019-08-06 ENCOUNTER — Other Ambulatory Visit: Payer: Self-pay | Admitting: Pain Medicine

## 2019-08-06 ENCOUNTER — Encounter: Payer: Self-pay | Admitting: Pain Medicine

## 2019-08-06 NOTE — Telephone Encounter (Signed)
Spoke with patient and is having difficulty getting his medications filled through [facility].  He understands that there are issues with [facility] but are forced to use them d/t insurance.  He is asking to have the 08/04/19 Rx sent to [facility] until they can get this straightened out.

## 2019-08-06 NOTE — Telephone Encounter (Signed)
Patient spoke with [facility], they may have individually wrapped coming in today but could not guarrentee a full script. Patient has missed 2 days of work due to pain. Needs to know what to do?

## 2019-08-07 ENCOUNTER — Other Ambulatory Visit (HOSPITAL_BASED_OUTPATIENT_CLINIC_OR_DEPARTMENT_OTHER): Payer: Self-pay | Admitting: Pain Medicine

## 2019-08-07 ENCOUNTER — Other Ambulatory Visit: Payer: Self-pay | Admitting: Pain Medicine

## 2019-08-07 DIAGNOSIS — G894 Chronic pain syndrome: Secondary | ICD-10-CM

## 2019-08-07 DIAGNOSIS — Z79899 Other long term (current) drug therapy: Secondary | ICD-10-CM

## 2019-08-07 MED ORDER — OXYCODONE HCL 5 MG PO TABS: 5.0000 mg | ORAL_TABLET | Freq: Four times a day (QID) | ORAL | 0 refills | Status: DC | PRN

## 2019-08-07 NOTE — Progress Notes (Signed)
Today I had to resend his prescriptions, this time to [facility] because [facility] did not have enough medicine for him.  He has a problem where his insurance requires that he go to [facility], but if they do not carry his medicine, then there is no point in him going to that pharmacy.  I had previously warned him about this problem that we were seen with [facility] in today I am calling him to let him know that I will be switching this prescriptions today, for the very last time.  From now on I recommend that he go to [facility] or any other pharmacy that he prefers, but if we again get a call from [facility] or from him saying that [facility] does not have his medicine, I will not be sending any more prescriptions anywhere else.  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  Therefore, he has been warned.

## 2019-08-20 ENCOUNTER — Telehealth: Payer: Self-pay

## 2019-08-20 ENCOUNTER — Other Ambulatory Visit: Payer: Self-pay | Admitting: Pain Medicine

## 2019-08-20 DIAGNOSIS — M792 Neuralgia and neuritis, unspecified: Secondary | ICD-10-CM

## 2019-08-20 MED ORDER — GABAPENTIN 600 MG PO TABS: 600.0000 mg | ORAL_TABLET | Freq: Four times a day (QID) | ORAL | 0 refills | Status: DC

## 2019-08-20 NOTE — Telephone Encounter (Signed)
He wants Kori to call him back. He states he has called and tried to get the issue with his medication resolved but no one is helping him.

## 2019-08-20 NOTE — Telephone Encounter (Signed)
Message sent to Dr Laban Emperor to order a 90 day supply of Gabapentin to [facility] [address] as requested by patient.

## 2019-08-21 ENCOUNTER — Telehealth: Payer: Self-pay | Admitting: Pain Medicine

## 2019-08-21 NOTE — Telephone Encounter (Signed)
Patient is requesting his gabapentin be sent in as 90 script. His insurance will not pay for 30 script for gabapentin. If he picks up this 30 script then he has to start paying and extra $40 per script. Please as Dr. Laban Emperor if he will send new script for 90 gabapentin. And let patient know

## 2019-08-21 NOTE — Telephone Encounter (Signed)
Spoke with pharmacy at [facility];  Gabapentin 30 day supply is standard co-pay of $15.  If they fill the 90 day supply with [facility] discount would be $56.12.  They do have a 30 day supply filled and ready for pick-up.    Called patient to convey information,  States that is in correct, his insurance will not cover the Rx this way.  States he has [employer] which is different.  He is going to call [facility] back and let me know if he needs [place] to do anything different.

## 2019-09-02 ENCOUNTER — Other Ambulatory Visit: Payer: Self-pay | Admitting: Pain Medicine

## 2019-09-24 ENCOUNTER — Telehealth: Payer: Self-pay | Admitting: Pain Medicine

## 2019-09-24 NOTE — Telephone Encounter (Signed)
Would you like to do a procedure for him or schedule him for a F2F appt?

## 2019-09-24 NOTE — Telephone Encounter (Signed)
Having severe right-sided sciatica, down back into back of thigh, then goes around to the front and down. 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

## 2019-09-29 NOTE — Progress Notes (Signed)
PROVIDER NOTE: Information contained herein reflects review and annotations entered in association with encounter. Interpretation of such information and data should be left to medically-trained personnel. Information provided to patient can be located elsewhere in the medical record under "Patient Instructions". Document created using STT-dictation technology, any transcriptional errors that may result from process are unintentional.    Patient: Adam Petty.  Service Category: E/M  Provider: Gaspar Cola, MD  DOB: [birth year]  DOS: 09/30/2019  Specialty: Interventional Pain Management  MRN: [id number]  Setting: Ambulatory outpatient  PCP: Zeb Comfort, MD  Type: Established Patient    Referring Provider: Zeb Comfort, MD  Location: Office  Delivery: Face-to-face     HPI  Reason for encounter: [name]., a 59 y.o. year old male, is here today for evaluation and management of his Chronic pain syndrome [G89.4]. Adam Petty primary complain today is Back Pain (lower) Last encounter: Practice (09/24/2019). My last encounter with him was on 09/24/2019. 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); Discogenic syndrome, lumbar (WC injury); Osteoarthritis of spine with radiculopathy, lumbar region (WC injury); Lumbar facet syndrome (WC injury) (Bilateral) (R>L); Lumbosacral radiculopathy (WC injury); Pain in right knee (WC injury); Lumbar canal stenosis; Neurogenic pain; Neuropathic pain; Musculoskeletal pain; Lumbar spondylosis; Chronic knee pain (Secondary area of Pain) (Right); Osteoarthritis of knee (Right); Chronic shoulder pain (Third area of Pain) (Right); Osteoarthritis of shoulder (Right); Chronic lower extremity pain (Right); Recurrent nephrolithiasis; Acute medial meniscal tear; Chronic pain  syndrome (WC injury); Acute low back pain without sciatica; Osteoarthritis of glenohumeral joint (Right); Chronic sacroiliac joint pain (Bilateral) (R>L); Spondylosis without myelopathy or radiculopathy, lumbosacral region; DDD (degenerative disc disease), lumbosacral; DDD (degenerative disc disease), thoracic; Lumbosacral radiculopathy (S1) (Right); and Long term current use of non-steroidal anti-inflammatories (NSAID) on their pertinent problem list. Pain Assessment: Severity of Chronic pain is reported as a 6 /10. Location: Back Lower/back of right leg to knee then to front of leg to foot. Onset: More than a month ago. Quality: Shooting, [place]. Timing: Intermittent. Modifying factor(s): nothing. Vitals:  height is _0  (1.676 m) and weight is 252 lb (114.3 kg). His temporal temperature is 97.3 F (36.3 C) (abnormal). His blood pressure is 139/78 and his pulse is 84. His respiration is 14 and oxygen saturation is 99%.   The patient is scheduled today for medication management and follow-up from his UDS on 07/01/2019. The patient indicates doing well with the current medication regimen. No adverse reactions or side effects reported to the medications.The patient indicates doing well with the current medication regimen. No adverse reactions or side effects reported to the medications.  The patient is currently taking oxycodone IR 5 mg 1 tablet p.o. every 6 hours (20 mg/day of oxycodone) (30 MME).  Unfortunately he is being fully aware of the oxycodone national shortage since he has been the victim of this problem.  He has had problems obtaining his medication at his pharmacy.  He has no known allergies.  The patient has been made aware of the oxycodone national shortage and given some options here.  One alternative is to go to the morphine ER (MS Contin) 15 mg tablet, 1 tablet p.o. twice daily (30 mg/day of morphine) (30 MME).  Another alternative is to go to hydrocodone/APAP 10/325 1 tablet p.o. 3 times daily  (30 mg/day  of hydrocodone) (30 MME).  After careful consideration, the patient has indicated that he has tried the hydrocodone 10 in the past and it has not really worked that well for him.  He wants to go with the extended release morphine.  Today we will go ahead and provide him with prescriptions for this medication.  He has been provided with enough prescriptions to last until 01/04/2020. RTC: 01/04/2020  In addition to this, the patient indicates having an increase in his right lower extremity pain which travels all the way down through the back of the leg and turning into the lateral aspect below the knee to about mid calf.  He indicates having a lot of problems driving secondary to this leg pain.  Today we again touched on the issue of his weight since he is still taking the scale at 252 pounds and he has a BMI of 40.67 kg/m.  The patient again was informed that we need to have him bring this down to less than 30.  He indicates currently seeing a nutritionist and he has also been in talks with a Ambulance person.  However, he feels that he needs something done for his right lower extremity pain.  Therefore we will go ahead and schedule him to come back for a caudal epidural steroid injection and epidurogram.  If he gets some benefit and the epidurogram demonstrates him to have epidural fibrosis, he may be a good candidate for a Racz procedure.  Today I also mentioned to the patient that looking into the future, if he continues to have this, problem we may need to consider the possibility of implant therapy.  He understood and accepted.  Transfer: Gabapentin (Neurontin) 600 mg tablet, 1 tablet p.o. 4 times daily (120/month) (11/18/2019)  Pharmacotherapy Assessment   Analgesic: Oxycodone IR 5 mg, 1 tab PO q 6 hrs (20 mg/day)  MME/day: 30 mg/day.   Monitoring: [place] PMP: PDMP reviewed during this encounter.       Pharmacotherapy: No side-effects or adverse reactions reported. Compliance: No problems  identified. Effectiveness: Clinically acceptable.  [name], RN  09/30/2019  8:43 AM  Sign when Signing Visit Nursing Pain Medication Assessment:  Safety precautions to be maintained throughout the outpatient stay will include: orient to surroundings, keep bed in low position, maintain call bell within reach at all times, provide assistance with transfer out of bed and ambulation.  Medication Inspection Compliance: Pill count conducted under aseptic conditions, in front of the patient. Neither the pills nor the bottle was removed from the patient's sight at any time. Once count was completed pills were immediately returned to the patient in their original bottle.  Medication: Oxycodone IR Pill/Patch Count: 23 of 120 pills remain Pill/Patch Appearance: Markings consistent with prescribed medication Bottle Appearance: Standard pharmacy container. Clearly labeled. Filled Date: 8 / 20 / 21 Last Medication intake:  TodaySafety precautions to be maintained throughout the outpatient stay will include: orient to surroundings, keep bed in low position, maintain call bell within reach at all times, provide assistance with transfer out of bed and ambulation.     UDS:  Summary  Date Value Ref Range Status  07/01/2019 Note  Final    Comment:    ==================================================================== [employer] [facility] 13 (MW) ==================================================================== Test                             Result       Flag  Units  Drug Present and Declared for Prescription Verification   Oxycodone                      854          EXPECTED   ng/mg creat   Oxymorphone                    1238         EXPECTED   ng/mg creat   Noroxycodone                   1109         EXPECTED   ng/mg creat   Noroxymorphone                 193          EXPECTED   ng/mg creat    Sources of oxycodone are scheduled prescription medications.    Oxymorphone, noroxycodone, and  noroxymorphone are expected    metabolites of oxycodone. Oxymorphone is also available as a    scheduled prescription medication.  Drug Absent but Declared for Prescription Verification   Tapentadol                     Not Detected UNEXPECTED ng/mg creat ==================================================================== Test                      Result    Flag   Units      Ref Range   Creatinine              151              mg/dL      >=20 ==================================================================== Declared Medications:  The flagging and interpretation on this report are based on the  following declared medications.  Unexpected results may arise from  inaccuracies in the declared medications.   **Note: The testing scope of this panel includes these medications:   Oxycodone  Tapentadol   **Note: The testing scope of this panel does not include the  following reported medications:   Cyanocobalamin  Gabapentin (Neurontin)  Insulin  Metformin (Glucophage)  Naproxen (Aleve)  Potassium  Ramipril (Altace)  Rosuvastatin (Crestor)  Semaglutide  Testosterone  Topical ==================================================================== For clinical consultation, please call [phone]. ====================================================================      ROS  Constitutional: Denies any fever or chills Gastrointestinal: No reported hemesis, hematochezia, vomiting, or acute GI distress Musculoskeletal: Denies any acute onset joint swelling, redness, loss of ROM, or weakness Neurological: No reported episodes of acute onset apraxia, aphasia, dysarthria, agnosia, amnesia, paralysis, loss of coordination, or loss of consciousness  Medication Review  Semaglutide(0.25 or 0.5MG/DOS), Tapentadol HCl, Testosterone, cyanocobalamin, gabapentin, insulin aspart, insulin degludec, ketoconazole, metFORMIN, morphine, naproxen sodium, potassium citrate, ramipril, and  rosuvastatin  History Review  Allergy: Mr. Hoon has No Known Allergies. Drug: Mr. Hengel  reports no history of drug use. Alcohol:  reports current alcohol use. Tobacco:  reports that he has never smoked. He has never used smokeless tobacco. Social: Mr. Figgs  reports that he has never smoked. He has never used smokeless tobacco. He reports current alcohol use. He reports that he does not use drugs. 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 Surgical: Mr.  Rhames  has a past surgical history that includes Tonsillectomy; Shoulder surgery; Palate surgery; Cervical fusion; Foot surgery; Lumbar laminectomy/decompression microdiscectomy (N/A, 02/06/2013); Knee arthroscopy (Right, 10/02/2013); Knee arthroscopy with medial menisectomy (Right, 03/25/2015); Knee bursectomy (Right, 03/25/2015); Cystoscopy w/ retrogrades (Right, 11/19/2015); Cystoscopy w/ retrogrades (Right, 10/03/2017); Ureteroscopy (Right, 10/03/2017); and CTR (Left, 02/27/2018). Family: family history includes COPD in his mother; Cancer in his mother; Diabetes in his father; Heart disease in his father and mother.  Laboratory Chemistry Profile   Renal Lab Results  Component Value Date   BUN 19 07/01/2019   CREATININE 1.08 07/01/2019   GFRAA >60 07/01/2019   GFRNONAA >60 07/01/2019     Hepatic Lab Results  Component Value Date   AST 26 07/01/2019   ALT 34 07/01/2019   ALBUMIN 4.2 07/01/2019   ALKPHOS 97 07/01/2019     Electrolytes Lab Results  Component Value Date   NA 140 07/01/2019   K 4.8 07/01/2019   CL 106 07/01/2019   CALCIUM 8.9 07/01/2019   MG 1.8 03/12/2015     Bone No results found for: VD25OH, VD125OH2TOT, WU9811BJ4, NW2956OZ3, 25OHVITD1, 25OHVITD2,  25OHVITD3, TESTOFREE, TESTOSTERONE   Inflammation (CRP: Acute Phase) (ESR: Chronic Phase) Lab Results  Component Value Date   CRP 0.6 03/12/2015   ESRSEDRATE 9 03/12/2015       Note: Above Lab results reviewed.  Recent Imaging Review  DG PAIN CLINIC C-ARM 1-60 MIN NO REPORT Fluoro was used, but no Radiologist interpretation will be provided.  Please refer to "NOTES" tab for provider progress note. Note: Reviewed        Physical Exam  General appearance: Well nourished, well developed, and well hydrated. In no apparent acute distress Mental status: Alert, oriented x 3 (person, place, & time)       Respiratory: No evidence of acute respiratory distress Eyes: PERLA Vitals: BP 139/78 (BP Location: Right Arm, Patient Position: Sitting, Cuff Size: Large)    Pulse 84    Temp (!) 97.3 F (36.3 C) (Temporal)    Resp 14    Ht _0  (1.676 m)    Wt 252 lb (114.3 kg)    SpO2 99%    BMI 40.67 kg/m  BMI: Estimated body mass index is 40.67 kg/m as calculated from the following:   Height as of this encounter: _1  (1.676 m).   Weight as of this encounter: 252 lb (114.3 kg). Ideal: Ideal body weight: 63.8 kg (140 lb 10.5 oz) Adjusted ideal body weight: 84 kg (185 lb 3.1 oz)  Assessment   Status Diagnosis  Controlled Controlled Controlled 1. Chronic pain syndrome   2. Chronic low back pain (WC injury) (Primary Area of Pain) (Bilateral) (R>L)   3. Chronic knee pain (Secondary area of Pain) (Right)   4. Chronic shoulder pain (Third area of Pain) (Right)   5. 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 Neurogenic pain   7. Pharmacologic therapy   8. Long term current use of non-steroidal anti-inflammatories (NSAID)   9. Uncomplicated opioid dependence ([place])      Updated Problems: Problem  Recurrent Nephrolithiasis   Formatting of this note might be different from the original. Takes potassium citrate, not flomax     Plan of Care  Problem-specific:  No problem-specific  Assessment & Plan notes found for this encounter.  Mr. Arren Laminack. has a current medication list which includes the following long-term medication(s): gabapentin and testosterone.  Pharmacotherapy (Medications Ordered): Meds ordered this encounter  Medications   morphine (MS CONTIN) 15 MG 12 hr tablet    Sig: Take 1 tablet (15 mg total) by mouth every 12 (twelve) hours. Must last 30 days. Do not break tablet    Dispense:  60 tablet    Refill:  0    Chronic Pain: STOP Act (Not applicable) Fill 1 day early if closed on refill date. Avoid benzodiazepines within 8 hours of opioids   morphine (MS CONTIN) 15 MG 12 hr tablet    Sig: Take 1 tablet (15 mg total) by mouth every 12 (twelve) hours. Must last 30 days. Do not break tablet    Dispense:  60 tablet    Refill:  0    Chronic Pain: STOP Act (Not applicable) Fill 1 day early if closed on refill date. Avoid benzodiazepines within 8 hours of opioids   morphine (MS CONTIN) 15 MG 12 hr tablet    Sig: Take 1 tablet (15 mg total) by mouth every 12 (twelve) hours. Must last 30 days. Do not break tablet    Dispense:  60 tablet    Refill:  0    Chronic Pain: STOP Act (Not applicable) Fill 1 day early if closed on refill date. Avoid benzodiazepines within 8 hours of opioids   Orders:  Orders Placed This Encounter  Procedures   Caudal Epidural Injection    Standing Status:   Future    Standing Expiration Date:   10/30/2019    Scheduling Instructions:     Laterality: Right-sided     Level(s): Sacrococcygeal canal (Tailbone area)     Sedation: Patient's choice     Scheduling Timeframe: As soon as pre-approved    Order Specific Question:   Where will this procedure be performed?    Answer:   [facility]   Follow-up plan:   Return for Procedure (w/ sedation): (R) Caudal ESI + Epidurogram #1.      Interventional treatment options: Planned, scheduled, and/or pending:   Diagnostic right caudal ESI #1 + diagnostic  epidurogram #1 under fluoroscopic guidance and IV sedation.  The patient's last MRI was done in 2016 at a [facility] institution.  He continues to have pain that he describes as slowly worsening.  He seems to have 2 sources for this pain.  There is the pain in his back which clearly is coming from his facet joints, but there is also pain down his right lower extremity that seems to be an S1 lumbosacral sensory radiculopathy.  Since he does have a prior history of back surgery, it is very possible that this may have something to do with epidural fibrosis.  We will be scheduling the patient to come in for a diagnostic right-sided caudal ESI + epidurogram under fluoroscopic guidance and IV sedation to determine if this is the case.  Satiate 21   Under consideration:   Diagnostic right caudal ESI #1 + diagnostic epidurogram #1  Possible right Suprascapular nerveRFA Possible left lumbar facet RFA  Diagnostic right knee genicular nerve block.  Possible right knee genicular nerve RFA   Therapeutic/palliative (PRN):   Palliative right lumbar facet block #4  Palliative left lumbar facet block #3  Palliative right lumbar facet RFA #2  (Right side done 04/13/2016) (100% relief) Diagnostic right Suprascapular nerve Block#2       Recent Visits No visits were found meeting these conditions. Showing recent visits within past 90 days and meeting all other requirements Today's Visits Date Type Provider Dept  09/30/19 Office Visit Dossie Arbour,  Beatriz Chancellor, MD [facility]  Showing today's visits and meeting all other requirements Future Appointments No visits were found meeting these conditions. Showing future appointments within next 90 days and meeting all other requirements  I discussed the assessment and treatment plan with the patient. The patient was provided an opportunity to ask questions and all were answered. The patient agreed with the plan and demonstrated an understanding of the  instructions.  Patient advised to call back or seek an in-person evaluation if the symptoms or condition worsens.  Duration of encounter: 30 minutes.  Note by: Gaspar Cola, MD Date: 09/30/2019; Time: 9:06 AM

## 2019-09-30 ENCOUNTER — Encounter: Payer: Self-pay | Admitting: Pain Medicine

## 2019-09-30 ENCOUNTER — Other Ambulatory Visit: Payer: Self-pay

## 2019-09-30 ENCOUNTER — Ambulatory Visit: Payer: BC Managed Care – PPO | Attending: Pain Medicine | Admitting: Pain Medicine

## 2019-09-30 VITALS — BP 139/78 | HR 84 | Temp 97.3°F | Resp 14 | Ht 66.0 in | Wt 252.0 lb

## 2019-09-30 DIAGNOSIS — M25561 Pain in right knee: Secondary | ICD-10-CM | POA: Diagnosis present

## 2019-09-30 DIAGNOSIS — F1120 Opioid dependence, uncomplicated: Secondary | ICD-10-CM

## 2019-09-30 DIAGNOSIS — M545 Low back pain: Secondary | ICD-10-CM | POA: Diagnosis present

## 2019-09-30 DIAGNOSIS — G8929 Other chronic pain: Secondary | ICD-10-CM

## 2019-09-30 DIAGNOSIS — Z79899 Other long term (current) drug therapy: Secondary | ICD-10-CM

## 2019-09-30 DIAGNOSIS — M792 Neuralgia and neuritis, unspecified: Secondary | ICD-10-CM

## 2019-09-30 DIAGNOSIS — Z791 Long term (current) use of non-steroidal anti-inflammatories (NSAID): Secondary | ICD-10-CM

## 2019-09-30 DIAGNOSIS — M961 Postlaminectomy syndrome, not elsewhere classified: Secondary | ICD-10-CM | POA: Diagnosis present

## 2019-09-30 DIAGNOSIS — G894 Chronic pain syndrome: Secondary | ICD-10-CM

## 2019-09-30 DIAGNOSIS — M25511 Pain in right shoulder: Secondary | ICD-10-CM

## 2019-09-30 MED ORDER — MORPHINE SULFATE ER 15 MG PO TBCR: 15.0000 mg | EXTENDED_RELEASE_TABLET | Freq: Two times a day (BID) | ORAL | 0 refills | Status: DC

## 2019-09-30 NOTE — Progress Notes (Signed)
Nursing Pain Medication Assessment:  Safety precautions to be maintained throughout the outpatient stay will include: orient to surroundings, keep bed in low position, maintain call bell within reach at all times, provide assistance with transfer out of bed and ambulation.  Medication Inspection Compliance: Pill count conducted under aseptic conditions, in front of the patient. Neither the pills nor the bottle was removed from the patient's sight at any time. Once count was completed pills were immediately returned to the patient in their original bottle.  Medication: Oxycodone IR Pill/Patch Count: 23 of 120 pills remain Pill/Patch Appearance: Markings consistent with prescribed medication Bottle Appearance: Standard pharmacy container. Clearly labeled. Filled Date: 8 / 20 / 21 Last Medication intake:  TodaySafety precautions to be maintained throughout the outpatient stay will include: orient to surroundings, keep bed in low position, maintain call bell within reach at all times, provide assistance with transfer out of bed and ambulation.

## 2019-09-30 NOTE — Patient Instructions (Signed)
______________________________________________________________________________________________  Weight Management Required  URGENT: Your weight has been found to be adversely affecting your health.  Dear Adam Petty:  Your current Estimated body mass index is 40.67 kg/m as calculated from the following:   Height as of this encounter: $RemoveBeforeD'5\' 6"'zafRiTcllziRek$  (1.676 m).   Weight as of this encounter: 252 lb (114.3 kg).  Please use the table below to identify your weight category and associated incidence of chronic pain, secondary to your weight.  Body Mass Index (BMI) Classification BMI level (kg/m2) Category Associated incidence of chronic pain  <18  Underweight   18.5-24.9 Ideal body weight   25-29.9 Overweight  20%  30-34.9 Obese (Class I)  68%  35-39.9 Severe obesity (Class II)  136%  >40 Extreme obesity (Class III)  254%   In addition: You will be considered "Morbidly Obese", if your BMI is above 30 and you have one or more of the following conditions which are known to be caused and/or directly associated with obesity: 1.    Type 2 Diabetes (Which in turn can lead to cardiovascular diseases (CVD), stroke, peripheral vascular diseases (PVD), retinopathy, nephropathy, and neuropathy) 2.    Cardiovascular Disease (High Blood Pressure; Congestive Heart Failure; High Cholesterol; Coronary Artery Disease; Angina; or History of Heart Attacks) 3.    Breathing problems (Asthma; obesity-hypoventilation syndrome; obstructive sleep apnea; chronic inflammatory airway disease; reactive airway disease; or shortness of breath) 4.    Chronic kidney disease 5.    Liver disease (nonalcoholic fatty liver disease) 6.    High blood pressure 7.    Acid reflux (gastroesophageal reflux disease; heartburn) 8.    Osteoarthritis (OA) (with any of the following: hip pain; knee pain; and/or low back pain) 9.    Low back pain (Lumbar Facet Syndrome; and/or Degenerative Disc Disease) 10.  Hip pain (Osteoarthritis of hip) (For  every 1 lbs of added body weight, there is a 2 lbs increase in pressure inside of each hip articulation. 1:2 mechanical relationship) 11.  Knee pain (Osteoarthritis of knee) (For every 1 lbs of added body weight, there is a 4 lbs increase in pressure inside of each knee articulation. 1:4 mechanical relationship) (patients with a BMI>30 kg/m2 were 6.8 times more likely to develop knee OA than normal-weight individuals) 12.  Cancer: Epidemiological studies have shown that obesity is a risk factor for: post-menopausal breast cancer; cancers of the endometrium, colon and kidney cancer; malignant adenomas of the oesophagus. Obese subjects have an approximately 1.5-3.5-fold increased risk of developing these cancers compared with normal-weight subjects, and it has been estimated that between 15 and 45% of these cancers can be attributed to overweight. More recent studies suggest that obesity may also increase the risk of other types of cancer, including pancreatic, hepatic and gallbladder cancer. (Ref: Obesity and cancer. Pischon T, Nthlings U, [employer] H. Proc Nutr Soc. 2008 May;67(2):128-45. doi: 54.5625/W3893734287681157.) The International Agency for Research on Cancer (IARC) has identified 13 cancers associated with overweight and obesity: meningioma, multiple myeloma, adenocarcinoma of the esophagus, and cancers of the thyroid, postmenopausal breast cancer, gallbladder, stomach, liver, pancreas, kidney, ovaries, uterus, colon and rectal (colorectal) cancers. 75 percent of all cancers diagnosed in women and 24 percent of those diagnosed in men are associated with overweight and obesity.  Recommendation: At this point it is urgent that you take a step back and concentrate in loosing weight. Dedicate 100% of your efforts on this task. Nothing else will improve your health more than bringing your weight down and your BMI  to less than 30. If you are here, you probably have chronic pain. Because most chronic pain  patients have difficulty exercising secondary to their pain, you must rely on proper nutrition and diet in order to lose the weight. If your BMI is above 40, you should seriously consider bariatric surgery. A realistic goal is to lose 10% of your body weight over a period of 12 months.  Be honest to yourself, if over time you have unsuccessfully tried to lose weight, then it is time for you to seek professional help and to enter a medically supervised weight management program, and/or undergo bariatric surgery. Stop procrastinating.   Pain management considerations:  1.    Pharmacological Problems: Be advised that the use of opioid analgesics (oxycodone; hydrocodone; morphine; methadone; codeine; and all of their derivatives) have been associated with decreased metabolism and weight gain.  For this reason, should we see that you are unable to lose weight while taking these medications, it may become necessary for [place] to taper down and indefinitely discontinue them.  2.    Technical Problems: The incidence of successful interventional therapies decreases as the patient's BMI increases. It is much more difficult to accomplish a safe and effective interventional therapy on a patient with a BMI above 35. 3.    Radiation Exposure Problems: The x-rays machine, used to accomplish injection therapies, will automatically increase their x-ray output in order to capture an appropriate bone image. This means that radiation exposure increases exponentially with the patient's BMI. (The higher the BMI, the higher the radiation exposure.) Although the level of radiation used at a given time is still safe to the patient, it is not for the physician and/or assisting staff. Unfortunately, radiation exposure is accumulative. Because physicians and the staff have to do procedures and be exposed on a daily basis, this can result in health problems such as cancer and radiation burns. Radiation exposure to the staff is monitored by the  radiation batches that they wear. The exposure levels are reported back to the staff on a quarterly basis. Depending on levels of exposure, physicians and staff may be obligated by law to decrease this exposure. This means that they have the right and obligation to refuse providing therapies where they may be overexposed to radiation. For this reason, physicians may decline to offer therapies such as radiofrequency ablation or implants to patients with a BMI above 40. 4.    Current Trends: Be advised that the current trend is to no longer offer certain therapies to patients with a BMI equal to, or above 35, due to increase perioperative risks, increased technical procedural difficulties, and excessive radiation exposure to healthcare personnel.  ______________________________________________________________________________________________

## 2019-10-02 ENCOUNTER — Other Ambulatory Visit: Payer: Self-pay

## 2019-10-02 ENCOUNTER — Encounter: Payer: Self-pay | Admitting: Student in an Organized Health Care Education/Training Program

## 2019-10-02 ENCOUNTER — Ambulatory Visit (HOSPITAL_BASED_OUTPATIENT_CLINIC_OR_DEPARTMENT_OTHER): Payer: BC Managed Care – PPO | Admitting: Student in an Organized Health Care Education/Training Program

## 2019-10-02 ENCOUNTER — Ambulatory Visit
Admission: RE | Admit: 2019-10-02 | Discharge: 2019-10-02 | Disposition: A | Discharge status: Home / Self Care | Payer: BC Managed Care – PPO | Source: Ambulatory Visit | Attending: Student in an Organized Health Care Education/Training Program | Admitting: Student in an Organized Health Care Education/Training Program

## 2019-10-02 VITALS — BP 143/95 | HR 82 | Temp 97.3°F | Resp 18 | Ht 66.0 in | Wt 250.0 lb

## 2019-10-02 DIAGNOSIS — G8929 Other chronic pain: Secondary | ICD-10-CM | POA: Diagnosis present

## 2019-10-02 DIAGNOSIS — M5417 Radiculopathy, lumbosacral region: Secondary | ICD-10-CM | POA: Insufficient documentation

## 2019-10-02 DIAGNOSIS — M545 Low back pain, unspecified: Secondary | ICD-10-CM

## 2019-10-02 DIAGNOSIS — G894 Chronic pain syndrome: Secondary | ICD-10-CM | POA: Insufficient documentation

## 2019-10-02 MED ORDER — SODIUM CHLORIDE 0.9% FLUSH
2.0000 mL | Freq: Once | INTRAVENOUS | Status: AC
  Administered 2019-10-02: 10 mL

## 2019-10-02 MED ORDER — IOHEXOL 180 MG/ML  SOLN
10.0000 mL | Freq: Once | INTRAMUSCULAR | Status: AC
  Administered 2019-10-02: 10 mL via EPIDURAL
  Filled 2019-10-02: qty 20

## 2019-10-02 MED ORDER — LIDOCAINE HCL 2 % IJ SOLN
INTRAMUSCULAR | Status: AC
  Filled 2019-10-02: qty 10

## 2019-10-02 MED ORDER — ROPIVACAINE HCL 2 MG/ML IJ SOLN
2.0000 mL | Freq: Once | INTRAMUSCULAR | Status: AC
  Administered 2019-10-02: 10 mL via EPIDURAL
  Filled 2019-10-02: qty 10

## 2019-10-02 MED ORDER — DEXAMETHASONE SODIUM PHOSPHATE 10 MG/ML IJ SOLN
10.0000 mg | Freq: Once | INTRAMUSCULAR | Status: AC
  Administered 2019-10-02: 10 mg
  Filled 2019-10-02: qty 1

## 2019-10-02 NOTE — Patient Instructions (Signed)

## 2019-10-02 NOTE — Progress Notes (Signed)
Safety precautions to be maintained throughout the outpatient stay will include: orient to surroundings, keep bed in low position, maintain call bell within reach at all times, provide assistance with transfer out of bed and ambulation.    2706 Patient had 2nd COVID vaccine yesterday 10/01/2019. Dr. Cherylann Ratel aware.

## 2019-10-02 NOTE — Progress Notes (Signed)
PROVIDER NOTE: Information contained herein reflects review and annotations entered in association with encounter. Interpretation of such information and data should be left to medically-trained personnel. Information provided to patient can be located elsewhere in the medical record under "Patient Instructions". Document created using STT-dictation technology, any transcriptional errors that may result from process are unintentional.    Patient: Adam BergFranklin W Castoro Jr.  Service Category: Procedure  Provider: Edward JollyBilal Tyrone Balash, MD  DOB: 11/16/1960  DOS: 10/02/2019  Location: [facility]  MRN: [id number]  Setting: Ambulatory - outpatient  Referring Provider: Lazarus GowdaGoeres, Lindsey W, MD  Type: Established Patient  Specialty: Interventional Pain Management  PCP: Lazarus GowdaGoeres, Lindsey W, MD   Primary Reason for Visit: Interventional Pain Management Treatment. CC: Back Pain (low and right)  Procedure:          Anesthesia, Analgesia, Anxiolysis:  Type: Therapeutic Epidural Steroid Injection #1  Region: Caudal Level: Sacrococcygeal   Laterality: Midline aiming at the right  Type: Local Anesthesia  Local Anesthetic: Lidocaine 1-2%  Position: Prone   Indications: 1. Lumbosacral radiculopathy (S1) (Right)   2. Chronic low back pain (WC injury) (Primary Area of Pain) (Bilateral) (R>L)   3. Chronic pain syndrome    Pain Score: Pre-procedure: 4 /10 Post-procedure: 2 /10   Pre-op Assessment:  [name] is a 59 y.o. (year old), male patient, seen today for interventional treatment. He  has a past surgical history that includes Tonsillectomy; Shoulder surgery; Palate surgery; Cervical fusion; Foot surgery; Lumbar laminectomy/decompression microdiscectomy (N/A, 02/06/2013); Knee arthroscopy (Right, 10/02/2013); Knee arthroscopy with medial menisectomy (Right, 03/25/2015); Knee bursectomy (Right, 03/25/2015); Cystoscopy w/ retrogrades (Right, 11/19/2015); Cystoscopy w/ retrogrades (Right, 10/03/2017);  Ureteroscopy (Right, 10/03/2017); and CTR (Left, 02/27/2018). [name] has a current medication list which includes the following prescription(s): cyanocobalamin, gabapentin, insulin degludec, ketoconazole, metformin, [START ON 10/06/2019] morphine, [START ON 11/05/2019] morphine, [START ON 12/05/2019] morphine, naproxen sodium, novolog flexpen, potassium citrate, ramipril, rosuvastatin, semaglutide(0.25 or 0.5mg /dos), tapentadol hcl, and testosterone. His primarily concern today is the Back Pain (low and right)  Initial Vital Signs:  Pulse/HCG Rate: 82ECG Heart Rate: 79 (nsr) Temp: (!) 97.3 F (36.3 C) Resp: 18 BP: (!) 152/97 SpO2: 99 %  BMI: Estimated body mass index is 40.35 kg/m as calculated from the following:   Height as of this encounter: 5\' 6"  (1.676 m).   Weight as of this encounter: 250 lb (113.4 kg).  Risk Assessment: Allergies: Reviewed. He has No Known Allergies.  Allergy Precautions: None required Coagulopathies: Reviewed. None identified.  Blood-thinner therapy: None at this time Active Infection(s): Reviewed. None identified. [name] is afebrile  Site Confirmation: [name] was asked to confirm the procedure and laterality before marking the site Procedure checklist: Completed Consent: Before the procedure and under the influence of no sedative(s), amnesic(s), or anxiolytics, the patient was informed of the treatment options, risks and possible complications. To fulfill our ethical and legal obligations, as recommended by the American Medical Association's Code of Ethics, I have informed the patient of my clinical impression; the nature and purpose of the treatment or procedure; the risks, benefits, and possible complications of the intervention; the alternatives, including doing nothing; the risk(s) and benefit(s) of the alternative treatment(s) or procedure(s); and the risk(s) and benefit(s) of doing nothing. The patient was provided information about the general  risks and possible complications associated with the procedure. These may include, but are not limited to: failure to achieve desired goals, infection, bleeding, organ or nerve damage, allergic reactions, paralysis, and death.  In addition, the patient was informed of those risks and complications associated to Spine-related procedures, such as failure to decrease pain; infection (i.e.: Meningitis, epidural or intraspinal abscess); bleeding (i.e.: epidural hematoma, subarachnoid hemorrhage, or any other type of intraspinal or peri-dural bleeding); organ or nerve damage (i.e.: Any type of peripheral nerve, nerve root, or spinal cord injury) with subsequent damage to sensory, motor, and/or autonomic systems, resulting in permanent pain, numbness, and/or weakness of one or several areas of the body; allergic reactions; (i.e.: anaphylactic reaction); and/or death. Furthermore, the patient was informed of those risks and complications associated with the medications. These include, but are not limited to: allergic reactions (i.e.: anaphylactic or anaphylactoid reaction(s)); adrenal axis suppression; blood sugar elevation that in diabetics may result in ketoacidosis or comma; water retention that in patients with history of congestive heart failure may result in shortness of breath, pulmonary edema, and decompensation with resultant heart failure; weight gain; swelling or edema; medication-induced neural toxicity; particulate matter embolism and blood vessel occlusion with resultant organ, and/or nervous system infarction; and/or aseptic necrosis of one or more joints. Finally, the patient was informed that Medicine is not an exact science; therefore, there is also the possibility of unforeseen or unpredictable risks and/or possible complications that may result in a catastrophic outcome. The patient indicated having understood very clearly. We have given the patient no guarantees and we have made no promises. Enough  time was given to the patient to ask questions, all of which were answered to the patient's satisfaction. Mr. Kotarski has indicated that he wanted to continue with the procedure. Attestation: I, the ordering provider, attest that I have discussed with the patient the benefits, risks, side-effects, alternatives, likelihood of achieving goals, and potential problems during recovery for the procedure that I have provided informed consent. Date  Time: 10/02/2019  8:22 AM  Pre-Procedure Preparation:  Monitoring: As per clinic protocol. Respiration, ETCO2, SpO2, BP, heart rate and rhythm monitor placed and checked for adequate function Safety Precautions: Patient was assessed for positional comfort and pressure points before starting the procedure. Time-out: I initiated and conducted the "Time-out" before starting the procedure, as per protocol. The patient was asked to participate by confirming the accuracy of the "Time Out" information. Verification of the correct person, site, and procedure were performed and confirmed by me, the nursing staff, and the patient. "Time-out" conducted as per Joint Commission's Universal Protocol (UP.01.01.01). Time: 0920  Description of Procedure:          Target Area: Caudal Epidural Canal. Approach: Midline approach. Area Prepped: Entire Posterior Sacrococcygeal Region DuraPrep (Iodine Povacrylex [0.7% available iodine] and Isopropyl Alcohol, 74% w/w) Safety Precautions: Aspiration looking for blood return was conducted prior to all injections. At no point did we inject any substances, as a needle was being advanced. No attempts were made at seeking any paresthesias. Safe injection practices and needle disposal techniques used. Medications properly checked for expiration dates. SDV (single dose vial) medications used. Description of the Procedure: Protocol guidelines were followed. The patient was placed in position over the fluoroscopy table. The target area was  identified and the area prepped in the usual manner. Skin & deeper tissues infiltrated with local anesthetic. Appropriate amount of time allowed to pass for local anesthetics to take effect. The procedure needles were then advanced to the target area. Proper needle placement secured. Negative aspiration confirmed. Solution injected in intermittent fashion, asking for systemic symptoms every 0.5cc of injectate. The needles were then removed and the area cleansed,  making sure to leave some of the prepping solution back to take advantage of its long term bactericidal properties. Vitals:   10/02/19 0826 10/02/19 0923 10/02/19 0926  BP: (!) 152/97 (!) 163/94 (!) 143/95  Pulse: 82    Resp: 18 (!) 21 18  Temp: (!) 97.3 F (36.3 C)    TempSrc: Temporal    SpO2: 99% 100% 98%  Weight: 250 lb (113.4 kg)    Height: 5\' 6"  (1.676 m)      Start Time: 0921 hrs. End Time: 0925 hrs. Materials:  Needle(s) Type: Epidural needle Gauge: 22G Length: 3.5-in Medication(s): Please see orders for medications and dosing details. 5 cc solution made of 2 cc of preservative-free saline, 2 cc of 0.2% ropivacaine, 1 cc of Decadron 10 mg/cc.  Imaging Guidance (Spinal):          Type of Imaging Technique: Fluoroscopy Guidance (Spinal) Indication(s): Assistance in needle guidance and placement for procedures requiring needle placement in or near specific anatomical locations not easily accessible without such assistance. Exposure Time: Please see nurses notes. Contrast: Before injecting any contrast, we confirmed that the patient did not have an allergy to iodine, shellfish, or radiological contrast. Once satisfactory needle placement was completed at the desired level, radiological contrast was injected. Contrast injected under live fluoroscopy. No contrast complications. See chart for type and volume of contrast used. Fluoroscopic Guidance: I was personally present during the use of fluoroscopy. "Tunnel Vision Technique"  used to obtain the best possible view of the target area. Parallax error corrected before commencing the procedure. "Direction-depth-direction" technique used to introduce the needle under continuous pulsed fluoroscopy. Once target was reached, antero-posterior, oblique, and lateral fluoroscopic projection used confirm needle placement in all planes. Images permanently stored in EMR. Interpretation: I personally interpreted the imaging intraoperatively. Adequate needle placement confirmed in multiple planes. Appropriate spread of contrast into desired area was observed. No evidence of afferent or efferent intravascular uptake. No intrathecal or subarachnoid spread observed. Permanent images saved into the patient's record.  Antibiotic Prophylaxis:   Anti-infectives (From admission, onward)   None     Indication(s): None identified  Post-operative Assessment:  Post-procedure Vital Signs:  Pulse/HCG Rate: 8286 Temp: (!) 97.3 F (36.3 C) Resp: 18 BP: (!) 143/95 SpO2: 98 %  EBL: None  Complications: No immediate post-treatment complications observed by team, or reported by patient.  Note: The patient tolerated the entire procedure well. A repeat set of vitals were taken after the procedure and the patient was kept under observation following institutional policy, for this type of procedure. Post-procedural neurological assessment was performed, showing return to baseline, prior to discharge. The patient was provided with post-procedure discharge instructions, including a section on how to identify potential problems. Should any problems arise concerning this procedure, the patient was given instructions to immediately contact , at any time, without hesitation. In any case, we plan to contact the patient by telephone for a follow-up status report regarding this interventional procedure.  Comments:  No additional relevant information.  Plan of Care  Orders:  Orders Placed This Encounter   Procedures  . DG PAIN CLINIC C-ARM 1-60 MIN NO REPORT    Intraoperative interpretation by procedural physician at [facility].    Standing Status:   Standing    Number of Occurrences:   1    Order Specific Question:   Reason for exam:    Answer:   Assistance in needle guidance and placement for procedures requiring needle placement in or near  specific anatomical locations not easily accessible without such assistance.    Medications ordered for procedure: Meds ordered this encounter  Medications  . iohexol (OMNIPAQUE) 180 MG/ML injection 10 mL    Must be Myelogram-compatible. If not available, you may substitute with a water-soluble, non-ionic, hypoallergenic, myelogram-compatible radiological contrast medium.  . ropivacaine (PF) 2 mg/mL (0.2%) (NAROPIN) injection 2 mL  . sodium chloride flush (NS) 0.9 % injection 2 mL  . dexamethasone (DECADRON) injection 10 mg   Medications administered: We administered iohexol, ropivacaine (PF) 2 mg/mL (0.2%), sodium chloride flush, and dexamethasone.  See the medical record for exact dosing, route, and time of administration.  Follow-up plan:   Return for Keep sch. appt with Dr Dorris Carnes.    Recent Visits Date Type Provider Dept  09/30/19 Office Visit Delano Metz, MD [facility]  Showing recent visits within past 90 days and meeting all other requirements Today's Visits Date Type Provider Dept  10/02/19 Procedure visit Edward Jolly, MD [facility]  Showing today's visits and meeting all other requirements Future Appointments Date Type Provider Dept  11/04/19 Appointment Delano Metz, MD [facility]  Showing future appointments within next 90 days and meeting all other requirements  Disposition: Discharge home  Discharge (Date  Time): 10/02/2019; 0930 hrs.   Primary Care Physician: Lazarus Gowda, MD Location: [facility] Note by: Edward Jolly, MD Date:  10/02/2019; Time: 10:38 AM  Disclaimer:  Medicine is not an exact science. The only guarantee in medicine is that nothing is guaranteed. It is important to note that the decision to proceed with this intervention was based on the information collected from the patient. The Data and conclusions were drawn from the patient's questionnaire, the interview, and the physical examination. Because the information was provided in large part by the patient, it cannot be guaranteed that it has not been purposely or unconsciously manipulated. Every effort has been made to obtain as much relevant data as possible for this evaluation. It is important to note that the conclusions that lead to this procedure are derived in large part from the available data. Always take into account that the treatment will also be dependent on availability of resources and existing treatment guidelines, considered by other Pain Management Practitioners as being common knowledge and practice, at the time of the intervention. For Medico-Legal purposes, it is also important to point out that variation in procedural techniques and pharmacological choices are the acceptable norm. The indications, contraindications, technique, and results of the above procedure should only be interpreted and judged by a Board-Certified Interventional Pain Specialist with extensive familiarity and expertise in the same exact procedure and technique.

## 2019-10-08 ENCOUNTER — Telehealth: Payer: Self-pay | Admitting: Pain Medicine

## 2019-10-08 NOTE — Telephone Encounter (Signed)
PA sent in by [facility].  Patient notified.

## 2019-10-08 NOTE — Telephone Encounter (Signed)
Pt called and stated that Dr Dorris Carnes switched his medication to morphine and it needs a PA. He states that he is out of meds and needs this done ASAP.

## 2019-10-10 ENCOUNTER — Ambulatory Visit
Payer: BC Managed Care – PPO | Attending: Student in an Organized Health Care Education/Training Program | Admitting: Student in an Organized Health Care Education/Training Program

## 2019-10-10 ENCOUNTER — Encounter: Payer: Self-pay | Admitting: Student in an Organized Health Care Education/Training Program

## 2019-10-10 ENCOUNTER — Other Ambulatory Visit: Payer: Self-pay

## 2019-10-10 ENCOUNTER — Telehealth: Payer: Self-pay | Admitting: Pain Medicine

## 2019-10-10 DIAGNOSIS — M5417 Radiculopathy, lumbosacral region: Secondary | ICD-10-CM | POA: Diagnosis not present

## 2019-10-10 DIAGNOSIS — M25561 Pain in right knee: Secondary | ICD-10-CM

## 2019-10-10 DIAGNOSIS — G894 Chronic pain syndrome: Secondary | ICD-10-CM

## 2019-10-10 DIAGNOSIS — M545 Low back pain: Secondary | ICD-10-CM

## 2019-10-10 DIAGNOSIS — Z79899 Other long term (current) drug therapy: Secondary | ICD-10-CM

## 2019-10-10 DIAGNOSIS — M792 Neuralgia and neuritis, unspecified: Secondary | ICD-10-CM

## 2019-10-10 DIAGNOSIS — M25511 Pain in right shoulder: Secondary | ICD-10-CM

## 2019-10-10 DIAGNOSIS — G8929 Other chronic pain: Secondary | ICD-10-CM

## 2019-10-10 MED ORDER — OXYCODONE-ACETAMINOPHEN 5-325 MG PO TABS: 1.0000 | ORAL_TABLET | Freq: Four times a day (QID) | ORAL | 0 refills | Status: DC | PRN

## 2019-10-10 NOTE — Progress Notes (Signed)
Patient: Adam Petty.  Service Category: E/M  Provider: Gillis Santa, MD  DOB: [birth year]  DOS: 10/10/2019  Location: Office  MRN: [id number]  Setting: Ambulatory outpatient  Referring Provider: Zeb Comfort, MD  Type: Established Patient  Specialty: Interventional Pain Management  PCP: Zeb Comfort, MD  Location: Home  Delivery: TeleHealth     Virtual Encounter - Pain Management PROVIDER NOTE: Information contained herein reflects review and annotations entered in association with encounter. Interpretation of such information and data should be left to medically-trained personnel. Information provided to patient can be located elsewhere in the medical record under "Patient Instructions". Document created using STT-dictation technology, any transcriptional errors that may result from process are unintentional.    Contact & Pharmacy Preferred: [phone] Home: [phone] (home) Mobile: [phone] (mobile) E-mail: [email or web address]  [facility] ([address] [place] ([place]) [place] [address] Phone: [phone] Fax: [phone]   Pre-screening  Mr. Dullea offered "in-person" vs "virtual" encounter. He indicated preferring virtual for this encounter.   Reason COVID-19*  Social distancing based on CDC and AMA recommendations.   I contacted [name]. on 10/10/2019 via video conference.      I clearly identified myself as Gillis Santa, MD. I verified that I was speaking with the correct person using two identifiers (Name: Jarrad Mclees., and date of birth: [birth year]).  Consent I sought verbal advanced consent from [name]. for virtual visit interactions. I informed Mr. Dellarocco of possible security and privacy concerns, risks, and limitations associated with providing "not-in-person" medical evaluation and management services. I also informed Mr. Rosenberg of the  availability of "in-person" appointments. Finally, I informed him that there would be a charge for the virtual visit and that he could be  personally, fully or partially, financially responsible for it. Mr. Fok expressed understanding and agreed to proceed.   Historic Elements   Mr. Yuvraj Pfeifer. is a 59 y.o. year old, male patient evaluated today after our last contact on 10/02/2019. 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 He also  has a past surgical history that includes Tonsillectomy; Shoulder surgery; Palate surgery; Cervical fusion; Foot surgery; Lumbar laminectomy/decompression microdiscectomy (N/A, 02/06/2013); Knee arthroscopy (Right, 10/02/2013); Knee arthroscopy with medial menisectomy (Right, 03/25/2015); Knee bursectomy (Right, 03/25/2015); Cystoscopy w/ retrogrades (Right, 11/19/2015); Cystoscopy w/ retrogrades (Right, 10/03/2017); Ureteroscopy (Right, 10/03/2017); and CTR (Left, 02/27/2018). Mr. Mittleman has a current medication list which includes the following prescription(s): cyanocobalamin, gabapentin, insulin degludec, ketoconazole, metformin, naproxen sodium, novolog flexpen, oxycodone-acetaminophen, potassium citrate, ramipril, rosuvastatin, semaglutide(0.25 or 0.25m/dos), tapentadol hcl, and testosterone. He  reports that he has never smoked. He has never used smokeless tobacco. He reports current alcohol use. He reports that he does not use drugs. Mr. BLejahas No Known Allergies.   HPI  Today, he is being contacted for medication management.   Virtual visit for medication management.  Patient's oxycodone was transition to morphine however this is not covered by his insurance.  Patient is requesting to go back to  his previous dose of oxycodone 5 mg every 6 hours as needed quantity 120/month MME equals 30.  He is status post a caudal epidural steroid injection last week.  He states that while the  injection was effective at reducing the intensity of his pain, he is still struggling quite a bit with lower back and bilateral leg pain.  PMP checked and appropriate.  We will transition him to Percocet 5 mg 4 times daily as needed, quantity 120/month.  Patient instructed to keep his follow-up appointment with Dr. Dossie Arbour at the end of next month.  Patient endorsed understanding.  Pharmacotherapy Assessment  Analgesic: Transition to Percocet 5 mg 4 times daily as needed (previously on oxycodone 5 mg 4 times daily as needed) Monitoring: [place] PMP: PDMP reviewed during this encounter.       Pharmacotherapy: No side-effects or adverse reactions reported. Compliance: No problems identified. Effectiveness: Clinically acceptable. Plan: Refer to "POC".  UDS:  Summary  Date Value Ref Range Status  07/01/2019 Note  Final    Comment:    ==================================================================== [facility] (MW) ==================================================================== Test                             Result       Flag       Units  Drug Present and Declared for Prescription Verification   Oxycodone                      854          EXPECTED   ng/mg creat   Oxymorphone                    1238         EXPECTED   ng/mg creat   Noroxycodone                   1109         EXPECTED   ng/mg creat   Noroxymorphone                 193          EXPECTED   ng/mg creat    Sources of oxycodone are scheduled prescription medications.    Oxymorphone, noroxycodone, and noroxymorphone are expected    metabolites of oxycodone. Oxymorphone is also available as a    scheduled prescription medication.  Drug Absent but Declared for Prescription Verification   Tapentadol                     Not Detected  UNEXPECTED ng/mg creat ==================================================================== Test                      Result    Flag   Units      Ref Range   Creatinine              151              mg/dL      >=20 ==================================================================== Declared Medications:  The flagging and interpretation on this report are based on the  following declared medications.  Unexpected results may arise from  inaccuracies in the declared medications.   **Note: The testing scope of this panel includes these medications:   Oxycodone  Tapentadol   **Note: The testing scope of this panel does not include the  following reported medications:   Cyanocobalamin  Gabapentin (Neurontin)  Insulin  Metformin (Glucophage)  Naproxen (Aleve)  Potassium  Ramipril (Altace)  Rosuvastatin (Crestor)  Semaglutide  Testosterone  Topical ==================================================================== For clinical consultation, please call [phone]. ====================================================================  Laboratory Chemistry Profile   Renal Lab Results  Component Value Date   BUN 19 07/01/2019   CREATININE 1.08 07/01/2019   GFRAA >60 07/01/2019   GFRNONAA >60 07/01/2019     Hepatic Lab Results  Component Value Date   AST 26 07/01/2019   ALT 34 07/01/2019   ALBUMIN 4.2 07/01/2019   ALKPHOS 97 07/01/2019     Electrolytes Lab Results  Component Value Date   NA 140 07/01/2019   K 4.8 07/01/2019   CL 106 07/01/2019   CALCIUM 8.9 07/01/2019   MG 1.8 03/12/2015     Bone No results found for: VD25OH, VD125OH2TOT, ON6295MW4, XL2440NU2, 25OHVITD1, 25OHVITD2, 25OHVITD3, TESTOFREE, TESTOSTERONE   Inflammation (CRP: Acute Phase) (ESR: Chronic Phase) Lab Results  Component Value Date   CRP 0.6 03/12/2015   ESRSEDRATE 9 03/12/2015       Note: Above Lab results reviewed.   Assessment  The primary encounter diagnosis was  Lumbosacral radiculopathy (S1) (Right). Diagnoses of Chronic low back pain (WC injury) (Primary Area of Pain) (Bilateral) (R>L), Chronic knee pain (Secondary area of Pain) (Right), Chronic shoulder pain (Third area of Pain) (Right), Neurogenic pain, Pharmacologic therapy, and Chronic pain syndrome were also pertinent to this visit.  Plan of Care  Mr. Thelbert Gartin. has a current medication list which includes the following long-term medication(s): gabapentin, oxycodone-acetaminophen, and testosterone.  Pharmacotherapy (Medications Ordered): Meds ordered this encounter  Medications  . oxyCODONE-acetaminophen (PERCOCET) 5-325 MG tablet    Sig: Take 1 tablet by mouth every 6 (six) hours as needed for severe pain. Must last 30 days.    Dispense:  120 tablet    Refill:  0    Chronic Pain. (STOP Act - Not applicable). Fill one day early if closed on scheduled refill date.   Follow-up plan:   Return for Keep sch. appt with Dr Delane Ginger.   Recent Visits Date Type Provider Dept  10/02/19 Procedure visit Gillis Santa, MD [facility]  09/30/19 Office Visit Milinda Pointer, MD [facility]  Showing recent visits within past 90 days and meeting all other requirements Today's Visits Date Type Provider Dept  10/10/19 Telemedicine Gillis Santa, MD [facility]  Showing today's visits and meeting all other requirements Future Appointments Date Type Provider Dept  11/04/19 Appointment Milinda Pointer, MD [facility]  01/01/20 Appointment Milinda Pointer, MD [facility]  Showing future appointments within next 90 days and meeting all other requirements  I discussed the assessment and treatment plan with the patient. The patient was provided an opportunity to ask questions and all were answered. The patient agreed with the plan and demonstrated an understanding of the instructions.  Patient advised to call back or seek an in-person evaluation if  the symptoms or condition worsens.  Duration of encounter: 30 minutes.  Note by: Gillis Santa, MD Date: 10/10/2019; Time: 11:49 AM

## 2019-10-10 NOTE — Telephone Encounter (Signed)
Dr Cherylann Ratel, this patient was ordered MS Contin by Dr Laban Emperor.  We did a PA and it was denied.  He is out of medication.  He states was previously taking Oxycodone 5 mg Q6 hours prn.   He states he does not have any liver or Kidney issues and takes Tylenol sometimes along with his oxycodone. Would you be willing to cover him until Dr Laban Emperor returns or would you like [place] to make an appointment?  You did a procedure on him on 10-02-2019.  Thank you!

## 2019-10-10 NOTE — Telephone Encounter (Signed)
Patient called stating he is still unable to pick up his meds, please check on PA and let patient know status. Thank you

## 2019-10-10 NOTE — Telephone Encounter (Signed)
Patient scheduled a virtual visit with Dr Cherylann Ratel today for medication management.

## 2019-10-24 ENCOUNTER — Ambulatory Visit: Payer: BC Managed Care – PPO | Admitting: Pain Medicine

## 2019-11-02 NOTE — Patient Instructions (Addendum)
°  Oxycodone to last until 02/07/20 and Gabapentin have been escribed to your pharmacy.

## 2019-11-02 NOTE — Progress Notes (Signed)
PROVIDER NOTE: Information contained herein reflects review and annotations entered in association with encounter. Interpretation of such information and data should be left to medically-trained personnel. Information provided to patient can be located elsewhere in the medical record under "Patient Instructions". Document created using STT-dictation technology, any transcriptional errors that may result from process are unintentional.    Patient: Adam Petty.  Service Category: E/M  Provider: Gaspar Cola, MD  DOB: 10-29-1960  DOS: 11/04/2019  Specialty: Interventional Pain Management  MRN: [id number]  Setting: Ambulatory outpatient  PCP: Ulyess Blossom, PA  Type: Established Patient    Referring Provider: Zeb Comfort, MD  Location: Office  Delivery: Face-to-face     HPI  Mr. Adam Malena., a 59 y.o. year old male, is here today because of his Chronic pain syndrome [G89.4]. [name] primary complain today is Back Pain (right, lower) Last encounter: My last encounter with him was on 10/10/2019. 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); Discogenic syndrome, lumbar (WC injury); Osteoarthritis of spine with radiculopathy, lumbar region (WC injury); Lumbar facet syndrome (WC injury) (Bilateral) (R>L); Lumbosacral radiculopathy (WC injury); Pain in right knee (WC injury); Lumbar canal stenosis; Neurogenic pain; Neuropathic pain; Musculoskeletal pain; Lumbar spondylosis; Chronic knee pain (Secondary area of Pain) (Right); Osteoarthritis of knee (Right); Chronic shoulder pain (Third area of Pain) (Right); Osteoarthritis of shoulder (Right); Chronic lower extremity pain (Right); Recurrent nephrolithiasis; Acute medial meniscal tear; Chronic pain syndrome (WC injury); Acute low back pain without  sciatica; Osteoarthritis of glenohumeral joint (Right); Chronic sacroiliac joint pain (Bilateral) (R>L); Spondylosis without myelopathy or radiculopathy, lumbosacral region; DDD (degenerative disc disease), lumbosacral; DDD (degenerative disc disease), thoracic; Lumbosacral radiculopathy (S1) (Right); and Long term current use of non-steroidal anti-inflammatories (NSAID) on their pertinent problem list. Pain Assessment: Severity of Chronic pain is reported as a 4 /10. Location: Back Right, Lower/denies. Onset: More than a month ago. Quality: Sharp, Stabbing. Timing: Intermittent. Modifying factor(s): heat, medications. Vitals:  height is '5\' 6"'  (1.676 m) and weight is 250 lb (113.4 kg). His temporal temperature is 97.5 F (36.4 C) (abnormal). His blood pressure is 155/89 (abnormal) and his pulse is 83. His respiration is 18 and oxygen saturation is 99%.   Reason for encounter: both, medication management and post-procedure assessment.  The patient indicates having done extremely well after the caudal epidural steroid injection.  Initially he only got 80% relief of the pain for the duration of the local anesthetic, but surprisingly, it later on improved his pain significantly.  To the point where he is currently having absolutely no lower extremity pain.  This means that he could be a good candidate for a Racz procedure. RTCB: 02/07/2020  Post-Procedure Evaluation  Procedure (10/10/2019): Therapeutic/diagnostic right sided caudal ESI #1 under fluoroscopic guidance, no sedation (block performed by Dr. Holley Raring) Pre-procedure pain level: 4/10 Post-procedure: 2/10 50% relief  Sedation: None.  Effectiveness during initial hour after procedure(Ultra-Short Term Relief): 80 %.  Local anesthetic used: Long-acting (4-6 hours) Effectiveness: Defined as any analgesic benefit obtained secondary to the administration of local anesthetics. This carries significant diagnostic value as to the etiological location, or  anatomical origin, of the pain. Duration of benefit is expected to coincide with the duration of the local anesthetic used.  Effectiveness during initial 4-6 hours after procedure(Short-Term Relief): 80 %.  Long-term benefit: Defined  as any relief past the pharmacologic duration of the local anesthetics.  Effectiveness past the initial 6 hours after procedure(Long-Term Relief): 100 %.  Current benefits: Defined as benefit that persist at this time.   Analgesia:  90-100% better Function: [name] reports improvement in function ROM: [name] reports improvement in ROM  Pharmacotherapy Assessment   Analgesic: Oxycodone IR 5 mg, 1 tab PO q 6 hrs (20 mg/day)  MME/day: 30 mg/day.   Monitoring: [place] PMP: PDMP reviewed during this encounter.       Pharmacotherapy: No side-effects or adverse reactions reported. Compliance: No problems identified. Effectiveness: Clinically acceptable.  Landis Martins, RN  11/04/2019  9:04 AM  Sign when Signing Visit Nursing Pain Medication Assessment:  Safety precautions to be maintained throughout the outpatient stay will include: orient to surroundings, keep bed in low position, maintain call bell within reach at all times, provide assistance with transfer out of bed and ambulation.  Medication Inspection Compliance: Pill count conducted under aseptic conditions, in front of the patient. Neither the pills nor the bottle was removed from the patient's sight at any time. Once count was completed pills were immediately returned to the patient in their original bottle.  Medication: Oxycodone/APAP Pill/Patch Count: 18 of 120 pills remain Pill/Patch Appearance: Markings consistent with prescribed medication Bottle Appearance: Standard pharmacy container. Clearly labeled. Filled Date: 10/10/2019 Last Medication intake:  Today    UDS:  Summary  Date Value Ref Range Status  07/01/2019 Note  Final    Comment:     ==================================================================== [employer] [facility] (MW) ==================================================================== Test                             Result       Flag       Units  Drug Present and Declared for Prescription Verification   Oxycodone                      854          EXPECTED   ng/mg creat   Oxymorphone                    1238         EXPECTED   ng/mg creat   Noroxycodone                   1109         EXPECTED   ng/mg creat   Noroxymorphone                 193          EXPECTED   ng/mg creat    Sources of oxycodone are scheduled prescription medications.    Oxymorphone, noroxycodone, and noroxymorphone are expected    metabolites of oxycodone. Oxymorphone is also available as a    scheduled prescription medication.  Drug Absent but Declared for Prescription Verification   Tapentadol                     Not Detected UNEXPECTED ng/mg creat ==================================================================== Test                      Result    Flag   Units      Ref Range   Creatinine              151  mg/dL      >=20 ==================================================================== Declared Medications:  The flagging and interpretation on this report are based on the  following declared medications.  Unexpected results may arise from  inaccuracies in the declared medications.   **Note: The testing scope of this panel includes these medications:   Oxycodone  Tapentadol   **Note: The testing scope of this panel does not include the  following reported medications:   Cyanocobalamin  Gabapentin (Neurontin)  Insulin  Metformin (Glucophage)  Naproxen (Aleve)  Potassium  Ramipril (Altace)  Rosuvastatin (Crestor)  Semaglutide  Testosterone  Topical ==================================================================== For clinical consultation, please call [phone]. ====================================================================      ROS  Constitutional: Denies any fever or chills Gastrointestinal: No reported hemesis, hematochezia, vomiting, or acute GI distress Musculoskeletal: Denies any acute onset joint swelling, redness, loss of ROM, or weakness Neurological: No reported episodes of acute onset apraxia, aphasia, dysarthria, agnosia, amnesia, paralysis, loss of coordination, or loss of consciousness  Medication Review  Semaglutide(0.25 or 0.5MG/DOS), Tapentadol HCl, Testosterone, cyanocobalamin, gabapentin, insulin aspart, insulin degludec, ketoconazole, metFORMIN, naproxen sodium, oxyCODONE-acetaminophen, potassium citrate, ramipril, and rosuvastatin  History Review  Allergy: [name] has No Known Allergies. Drug: [name]  reports no history of drug use. Alcohol:  reports current alcohol use. Tobacco:  reports that he has never smoked. He has never used smokeless tobacco. Social: [name]  reports that he has never smoked. He has never used smokeless tobacco. He reports current alcohol use. He reports that he does not use drugs. 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 Surgical: [name]  has a past surgical history that includes Tonsillectomy; Shoulder surgery; Palate surgery; Cervical fusion; Foot surgery; Lumbar laminectomy/decompression microdiscectomy (N/A, 02/06/2013); Knee arthroscopy (Right, 10/02/2013); Knee arthroscopy with medial menisectomy (Right, 03/25/2015); Knee bursectomy (Right, 03/25/2015); Cystoscopy w/ retrogrades (Right, 11/19/2015); Cystoscopy w/ retrogrades (Right, 10/03/2017); Ureteroscopy (Right, 10/03/2017); and CTR (Left, 02/27/2018). Family:  family history includes COPD in his mother; Cancer in his mother; Diabetes in his father; Heart disease in his father and mother.  Laboratory Chemistry Profile   Renal Lab Results  Component Value Date   BUN 19 07/01/2019   CREATININE 1.08 07/01/2019   GFRAA >60 07/01/2019   GFRNONAA >60 07/01/2019     Hepatic Lab Results  Component Value Date   AST 26 07/01/2019   ALT 34 07/01/2019   ALBUMIN 4.2 07/01/2019   ALKPHOS 97 07/01/2019     Electrolytes Lab Results  Component Value Date   NA 140 07/01/2019   K 4.8 07/01/2019   CL 106 07/01/2019   CALCIUM 8.9 07/01/2019   MG 1.8 03/12/2015     Bone No results found for: VD25OH, VD125OH2TOT, AU6333LK5, GY5638LH7, 25OHVITD1, 25OHVITD2, 25OHVITD3, TESTOFREE, TESTOSTERONE   Inflammation (CRP: Acute Phase) (ESR: Chronic Phase) Lab Results  Component Value Date   CRP 0.6 03/12/2015   ESRSEDRATE 9 03/12/2015       Note: Above Lab results reviewed.  Recent Imaging Review  DG PAIN CLINIC C-ARM 1-60 MIN NO REPORT Fluoro was used, but no Radiologist interpretation will be provided.  Please refer to "NOTES" tab for provider progress note. Note: Reviewed        Physical Exam  General appearance: Well nourished, well developed, and well hydrated. In no apparent acute distress Mental status: Alert,  oriented x 3 (person, place, & time)       Respiratory: No evidence of acute respiratory distress Eyes: PERLA Vitals: BP (!) 155/89   Pulse 83   Temp (!) 97.5 F (36.4 C) (Temporal)   Resp 18   Ht '5\' 6"'  (1.676 m)   Wt 250 lb (113.4 kg)   SpO2 99%   BMI 40.35 kg/m  BMI: Estimated body mass index is 40.35 kg/m as calculated from the following:   Height as of this encounter: '5\' 6"'  (1.676 m).   Weight as of this encounter: 250 lb (113.4 kg). Ideal: Ideal body weight: 63.8 kg (140 lb 10.5 oz) Adjusted ideal body weight: 83.6 kg (184 lb 6.3 oz)  Assessment   Status Diagnosis  Controlled Controlled Controlled 1. Chronic pain  syndrome (WC injury)   2. Chronic low back pain (WC injury) (Primary Area of Pain) (Bilateral) (R>L)   3. Chronic knee pain (Secondary area of Pain) (Right)   4. Chronic shoulder pain (Third area of Pain) (Right)   5. 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 Lumbosacral radiculopathy (S1) (Right)   7. Chronic pain syndrome   8. Neurogenic pain      Updated Problems: No problems updated.  Plan of Care  Problem-specific:  No problem-specific Assessment & Plan notes found for this encounter.  [name] has a current medication list which includes the following long-term medication(s): testosterone, [START ON 11/18/2019] gabapentin, [START ON 11/09/2019] oxycodone-acetaminophen, [START ON 12/09/2019] oxycodone-acetaminophen, and [START ON 01/08/2020] oxycodone-acetaminophen.  Pharmacotherapy (Medications Ordered): Meds ordered this encounter  Medications  . oxyCODONE-acetaminophen (PERCOCET) 5-325 MG tablet    Sig: Take 1 tablet by mouth every 6 (six) hours as needed for severe pain. Must last 30 days.    Dispense:  120 tablet    Refill:  0    Chronic Pain: STOP Act (Not applicable) Fill 1 day early if closed on refill date. Avoid benzodiazepines within 8 hours of opioids  . gabapentin (NEURONTIN) 600 MG tablet    Sig: Take 1 tablet (600 mg total) by mouth every 6 (six) hours.    Dispense:  360 tablet    Refill:  0    Fill one day early if pharmacy is closed on scheduled refill date. May substitute for generic if available.  Marland Kitchen oxyCODONE-acetaminophen (PERCOCET) 5-325 MG tablet    Sig: Take 1 tablet by mouth every 6 (six) hours as needed for severe pain. Must last 30 days.    Dispense:  120 tablet    Refill:  0    Chronic Pain: STOP Act (Not applicable) Fill 1 day early if closed on refill date. Avoid benzodiazepines within 8 hours of opioids  . oxyCODONE-acetaminophen (PERCOCET) 5-325 MG tablet    Sig: Take 1 tablet by mouth every 6 (six) hours as needed  for severe pain. Must last 30 days.    Dispense:  120 tablet    Refill:  0    Chronic Pain: STOP Act (Not applicable) Fill 1 day early if closed on refill date. Avoid benzodiazepines within 8 hours of opioids   Orders:  No orders of the defined types were placed in this encounter.  Follow-up plan:   Return in about 3 months (around 02/07/2020) for (F2F), (Med Mgmt).      Interventional treatment options: Planned, scheduled, and/or pending:   Diagnostic right caudal ESI #1 + diagnostic epidurogram #1 under fluoroscopic guidance and IV sedation.  The patient's last  MRI was done in 2016 at a [facility] institution.  He continues to have pain that he describes as slowly worsening.  He seems to have 2 sources for this pain.  There is the pain in his back which clearly is coming from his facet joints, but there is also pain down his right lower extremity that seems to be an S1 lumbosacral sensory radiculopathy.  Since he does have a prior history of back surgery, it is very possible that this may have something to do with epidural fibrosis.  We will be scheduling the patient to come in for a diagnostic right-sided caudal ESI + epidurogram under fluoroscopic guidance and IV sedation to determine if this is the case.  Satiate 21   Under consideration:   Diagnostic right caudal ESI #1 + diagnostic epidurogram #1  Possible right Suprascapular nerveRFA Possible left lumbar facet RFA  Diagnostic right knee genicular nerve block.  Possible right knee genicular nerve RFA   Therapeutic/palliative (PRN):   Palliative right lumbar facet block #4  Palliative left lumbar facet block #3  Palliative right lumbar facet RFA #2  (Right side done 04/13/2016) (100% relief) Diagnostic right Suprascapular nerve Block#2        Recent Visits Date Type Provider Dept  10/10/19 Telemedicine Gillis Santa, MD [facility]  10/02/19 Procedure visit Gillis Santa, MD [facility]  09/30/19 Office Visit  Milinda Pointer, MD [facility]  Showing recent visits within past 90 days and meeting all other requirements Today's Visits Date Type Provider Dept  11/04/19 Office Visit Milinda Pointer, MD [facility]  Showing today's visits and meeting all other requirements Future Appointments Date Type Provider Dept  01/01/20 Appointment Milinda Pointer, MD [facility]  Showing future appointments within next 90 days and meeting all other requirements  I discussed the assessment and treatment plan with the patient. The patient was provided an opportunity to ask questions and all were answered. The patient agreed with the plan and demonstrated an understanding of the instructions.  Patient advised to call back or seek an in-person evaluation if the symptoms or condition worsens.  Duration of encounter: 30 minutes.  Note by: Gaspar Cola, MD Date: 11/04/2019; Time: 9:41 AM

## 2019-11-04 ENCOUNTER — Encounter: Payer: Self-pay | Admitting: Pain Medicine

## 2019-11-04 ENCOUNTER — Other Ambulatory Visit: Payer: Self-pay

## 2019-11-04 ENCOUNTER — Ambulatory Visit: Payer: BC Managed Care – PPO | Attending: Pain Medicine | Admitting: Pain Medicine

## 2019-11-04 VITALS — BP 155/89 | HR 83 | Temp 97.5°F | Resp 18 | Ht 66.0 in | Wt 250.0 lb

## 2019-11-04 DIAGNOSIS — M545 Low back pain, unspecified: Secondary | ICD-10-CM | POA: Diagnosis not present

## 2019-11-04 DIAGNOSIS — M961 Postlaminectomy syndrome, not elsewhere classified: Secondary | ICD-10-CM

## 2019-11-04 DIAGNOSIS — G894 Chronic pain syndrome: Secondary | ICD-10-CM | POA: Diagnosis present

## 2019-11-04 DIAGNOSIS — M25561 Pain in right knee: Secondary | ICD-10-CM

## 2019-11-04 DIAGNOSIS — M5417 Radiculopathy, lumbosacral region: Secondary | ICD-10-CM

## 2019-11-04 DIAGNOSIS — G8929 Other chronic pain: Secondary | ICD-10-CM

## 2019-11-04 DIAGNOSIS — M25511 Pain in right shoulder: Secondary | ICD-10-CM

## 2019-11-04 DIAGNOSIS — M792 Neuralgia and neuritis, unspecified: Secondary | ICD-10-CM

## 2019-11-04 MED ORDER — OXYCODONE-ACETAMINOPHEN 5-325 MG PO TABS: 1.0000 | ORAL_TABLET | Freq: Four times a day (QID) | ORAL | 0 refills | Status: DC | PRN

## 2019-11-04 MED ORDER — GABAPENTIN 600 MG PO TABS: 600.0000 mg | ORAL_TABLET | Freq: Four times a day (QID) | ORAL | 0 refills | Status: AC

## 2019-11-04 NOTE — Progress Notes (Signed)
Nursing Pain Medication Assessment:  Safety precautions to be maintained throughout the outpatient stay will include: orient to surroundings, keep bed in low position, maintain call bell within reach at all times, provide assistance with transfer out of bed and ambulation.  Medication Inspection Compliance: Pill count conducted under aseptic conditions, in front of the patient. Neither the pills nor the bottle was removed from the patient's sight at any time. Once count was completed pills were immediately returned to the patient in their original bottle.  Medication: Oxycodone/APAP Pill/Patch Count: 18 of 120 pills remain Pill/Patch Appearance: Markings consistent with prescribed medication Bottle Appearance: Standard pharmacy container. Clearly labeled. Filled Date: 10/10/2019 Last Medication intake:  Today

## 2020-01-01 ENCOUNTER — Encounter: Payer: BC Managed Care – PPO | Admitting: Pain Medicine

## 2020-01-26 NOTE — Progress Notes (Signed)
PROVIDER NOTE: Information contained herein reflects review and annotations entered in association with encounter. Interpretation of such information and data should be left to medically-trained personnel. Information provided to patient can be located elsewhere in the medical record under "Patient Instructions". Document created using STT-dictation technology, any transcriptional errors that may result from process are unintentional.    Patient: Adam Petty.  Service Category: E/M  Provider: Gaspar Cola, MD  DOB: 08/22/1960  DOS: 01/29/2020  Specialty: Interventional Pain Management  MRN: [id number]  Setting: Ambulatory outpatient  PCP: Ulyess Blossom, PA  Type: Established Patient    Referring Provider: Ulyess Blossom, PA  Location: Office  Delivery: Face-to-face     HPI  [name], a 60 y.o. year old male, is here today because of his Chronic pain syndrome [G89.4]. Adam Petty primary complain today is Back Pain (Low right) Last encounter: My last encounter with him was on 11/04/2019. 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); Discogenic syndrome, lumbar (WC injury); Osteoarthritis of spine with radiculopathy, lumbar region (WC injury); Lumbar facet syndrome (WC injury) (Bilateral) (R>L); Lumbosacral radiculopathy (WC injury); Pain in right knee (WC injury); Lumbar canal stenosis; Neurogenic pain; Neuropathic pain; Musculoskeletal pain; Lumbar spondylosis; Chronic knee pain (Secondary area of Pain) (Right); Osteoarthritis of knee (Right); Chronic shoulder pain (Third area of Pain) (Right); Osteoarthritis of shoulder (Right); Chronic lower extremity pain (Right); Recurrent nephrolithiasis; Acute medial meniscal tear; Chronic pain syndrome (WC injury); Acute low back pain without sciatica;  Osteoarthritis of glenohumeral joint (Right); Chronic sacroiliac joint pain (Bilateral) (R>L); Spondylosis without myelopathy or radiculopathy, lumbosacral region; DDD (degenerative disc disease), lumbosacral; DDD (degenerative disc disease), thoracic; Lumbosacral radiculopathy (S1) (Right); and Long term current use of non-steroidal anti-inflammatories (NSAID) on their pertinent problem list. Pain Assessment: Severity of Chronic pain is reported as a 4 /10. Location: Back Lower,Right/radiates down right leg in the back to knee. Onset: More than a month ago. Quality: Sharp,Throbbing. Timing: Constant. Modifying factor(s): heat. Vitals:  height is '5\' 6"'  (1.676 m) and weight is 250 lb (113.4 kg). His temperature is 98.2 F (36.8 C). His blood pressure is 149/84 (abnormal) and his pulse is 87. His respiration is 16 and oxygen saturation is 99%.   Reason for encounter: medication management.   The patient indicates doing well with the current medication regimen. No adverse reactions or side effects reported to the medications. PMP & UDS compliant.   RTCB: 05/12/2020 Nonopioids transfer 11/04/2019: Neurontin  Pharmacotherapy Assessment   Analgesic: Oxycodone IR 5 mg, 1 tab PO q 6 hrs (20 mg/day)  MME/day: 30 mg/day.   Monitoring: Reading PMP: PDMP reviewed during this encounter.       Pharmacotherapy: No side-effects or adverse reactions reported. Compliance: No problems identified. Effectiveness: Clinically acceptable.  Dewayne Shorter, RN  01/29/2020  8:32 AM  Signed Nursing Pain Medication Assessment:  Safety precautions to be maintained throughout the outpatient stay will include: orient to surroundings, keep bed in low position, maintain call bell within reach at all times, provide assistance with transfer out of bed and ambulation.  Medication Inspection Compliance: Pill count conducted under aseptic conditions, in front of the patient. Neither the pills nor the bottle was removed from the patient's  sight at any time. Once count was completed pills were immediately returned to the patient in their  original bottle.  Medication: Oxycodone/APAP Pill/Patch Count: 68 of 120 pills remain Pill/Patch Appearance: Markings consistent with prescribed medication Bottle Appearance: Standard pharmacy container. Clearly labeled. Filled Date: 12/ 27 / 2022 Last Medication intake:  Today    UDS:  Summary  Date Value Ref Range Status  07/01/2019 Note  Final    Comment:    ==================================================================== [employer] [facility] (MW) ==================================================================== Test                             Result       Flag       Units  Drug Present and Declared for Prescription Verification   Oxycodone                      854          EXPECTED   ng/mg creat   Oxymorphone                    1238         EXPECTED   ng/mg creat   Noroxycodone                   1109         EXPECTED   ng/mg creat   Noroxymorphone                 193          EXPECTED   ng/mg creat    Sources of oxycodone are scheduled prescription medications.    Oxymorphone, noroxycodone, and noroxymorphone are expected    metabolites of oxycodone. Oxymorphone is also available as a    scheduled prescription medication.  Drug Absent but Declared for Prescription Verification   Tapentadol                     Not Detected UNEXPECTED ng/mg creat ==================================================================== Test                      Result    Flag   Units      Ref Range   Creatinine              151              mg/dL      >=20 ==================================================================== Declared Medications:  The flagging and interpretation on this report are based on the  following declared medications.  Unexpected results may arise from  inaccuracies in the declared medications.   **Note: The testing scope of this panel includes these medications:    Oxycodone  Tapentadol   **Note: The testing scope of this panel does not include the  following reported medications:   Cyanocobalamin  Gabapentin (Neurontin)  Insulin  Metformin (Glucophage)  Naproxen (Aleve)  Potassium  Ramipril (Altace)  Rosuvastatin (Crestor)  Semaglutide  Testosterone  Topical ==================================================================== For clinical consultation, please call [phone]. ====================================================================      ROS  Constitutional: Denies any fever or chills Gastrointestinal: No reported hemesis, hematochezia, vomiting, or acute GI distress Musculoskeletal: Denies any acute onset joint swelling, redness, loss of ROM, or weakness Neurological: No reported episodes of acute onset apraxia, aphasia, dysarthria, agnosia, amnesia, paralysis, loss of coordination, or loss of consciousness  Medication Review  Semaglutide(0.25 or 0.5MG/DOS), Tapentadol HCl, Testosterone, cyanocobalamin, gabapentin, insulin aspart, insulin degludec, ketoconazole, metFORMIN, naproxen sodium, oxyCODONE-acetaminophen, potassium citrate, ramipril, and rosuvastatin  History Review  Allergy: Adam Petty has No Known Allergies. Drug: Adam Petty  reports no history of drug use. Alcohol:  reports current alcohol use. Tobacco:  reports that he has never smoked. He has never used smokeless tobacco. Social: Adam Petty  reports that he has never smoked. He has never used smokeless tobacco. He reports current alcohol use. He reports that he does not use drugs. 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 Surgical: Mr. Cubero  has a past surgical history that includes  Tonsillectomy; Shoulder surgery; Palate surgery; Cervical fusion; Foot surgery; Lumbar laminectomy/decompression microdiscectomy (N/A, 02/06/2013); Knee arthroscopy (Right, 10/02/2013); Knee arthroscopy with medial menisectomy (Right, 03/25/2015); Knee bursectomy (Right, 03/25/2015); Cystoscopy w/ retrogrades (Right, 11/19/2015); Cystoscopy w/ retrogrades (Right, 10/03/2017); Ureteroscopy (Right, 10/03/2017); and CTR (Left, 02/27/2018). Family: family history includes COPD in his mother; Cancer in his mother; Diabetes in his father; Heart disease in his father and mother.  Laboratory Chemistry Profile   Renal Lab Results  Component Value Date   BUN 19 07/01/2019   CREATININE 1.08 07/01/2019   GFRAA >60 07/01/2019   GFRNONAA >60 07/01/2019     Hepatic Lab Results  Component Value Date   AST 26 07/01/2019   ALT 34 07/01/2019   ALBUMIN 4.2 07/01/2019   ALKPHOS 97 07/01/2019     Electrolytes Lab Results  Component Value Date   NA 140 07/01/2019   K 4.8 07/01/2019   CL 106 07/01/2019   CALCIUM 8.9 07/01/2019   MG 1.8 03/12/2015     Bone No results found for: VD25OH, VD125OH2TOT, TK2409BD5, HG9924QA8, 25OHVITD1, 25OHVITD2, 25OHVITD3, TESTOFREE, TESTOSTERONE   Inflammation (CRP: Acute Phase) (ESR: Chronic Phase) Lab Results  Component Value Date   CRP 0.6 03/12/2015   ESRSEDRATE 9 03/12/2015       Note: Above Lab results reviewed.  Recent Imaging Review  DG PAIN CLINIC C-ARM 1-60 MIN NO REPORT Fluoro was used, but no Radiologist interpretation will be provided.  Please refer to "NOTES" tab for provider progress note. Note: Reviewed        Physical Exam  General appearance: Well nourished, well developed, and well hydrated. In no apparent acute distress Mental status: Alert, oriented x 3 (person, place, & time)       Respiratory: No evidence of acute respiratory distress Eyes: PERLA Vitals: BP (!) 149/84   Pulse 87   Temp 98.2 F (36.8 C)   Resp 16   Ht '5\' 6"'  (1.676 m)   Wt  250 lb (113.4 kg)   SpO2 99%   BMI 40.35 kg/m  BMI: Estimated body mass index is 40.35 kg/m as calculated from the following:   Height as of this encounter: '5\' 6"'  (1.676 m).   Weight as of this encounter: 250 lb (113.4 kg). Ideal: Ideal body weight: 63.8 kg (140 lb 10.5 oz) Adjusted ideal body weight: 83.6 kg (184 lb 6.3 oz)  Assessment   Status Diagnosis  Controlled Controlled Controlled 1. Chronic pain syndrome (WC injury)   2. Chronic low back pain (WC injury) (Primary Area of Pain) (Bilateral) (R>L)   3. Chronic knee pain (Secondary area of Pain) (Right)   4. Chronic shoulder pain (Third area of Pain) (Right)   5. 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 Lumbosacral radiculopathy (S1) (Right)   7. Pharmacologic  therapy   8. Chronic pain syndrome      Updated Problems: No problems updated.  Plan of Care  Problem-specific:  No problem-specific Assessment & Plan notes found for this encounter.  Adam Petty. has a current medication list which includes the following long-term medication(s): gabapentin, testosterone, [START ON 02/12/2020] oxycodone-acetaminophen, [START ON 03/13/2020] oxycodone-acetaminophen, and [START ON 04/12/2020] oxycodone-acetaminophen.  Pharmacotherapy (Medications Ordered): Meds ordered this encounter  Medications  . oxyCODONE-acetaminophen (PERCOCET) 5-325 MG tablet    Sig: Take 1 tablet by mouth every 6 (six) hours as needed for severe pain. Must last 30 days.    Dispense:  120 tablet    Refill:  0    Chronic Pain: STOP Act (Not applicable) Fill 1 day early if closed on refill date. Avoid benzodiazepines within 8 hours of opioids  . oxyCODONE-acetaminophen (PERCOCET) 5-325 MG tablet    Sig: Take 1 tablet by mouth every 6 (six) hours as needed for severe pain. Must last 30 days.    Dispense:  120 tablet    Refill:  0    Chronic Pain: STOP Act (Not applicable) Fill 1 day early if closed on refill date. Avoid benzodiazepines  within 8 hours of opioids  . oxyCODONE-acetaminophen (PERCOCET) 5-325 MG tablet    Sig: Take 1 tablet by mouth every 6 (six) hours as needed for severe pain. Must last 30 days.    Dispense:  120 tablet    Refill:  0    Chronic Pain: STOP Act (Not applicable) Fill 1 day early if closed on refill date. Avoid benzodiazepines within 8 hours of opioids   Orders:  No orders of the defined types were placed in this encounter.  Follow-up plan:   Return in about 3 months (around 05/12/2020) for (F2F), (Med Mgmt).      Interventional treatment options: Planned, scheduled, and/or pending:   Diagnostic right caudal ESI #1 + diagnostic epidurogram #1 under fluoroscopic guidance and IV sedation.  The patient's last MRI was done in 2016 at a [facility] institution.  He continues to have pain that he describes as slowly worsening.  He seems to have 2 sources for this pain.  There is the pain in his back which clearly is coming from his facet joints, but there is also pain down his right lower extremity that seems to be an S1 lumbosacral sensory radiculopathy.  Since he does have a prior history of back surgery, it is very possible that this may have something to do with epidural fibrosis.  We will be scheduling the patient to come in for a diagnostic right-sided caudal ESI + epidurogram under fluoroscopic guidance and IV sedation to determine if this is the case.  Satiate 21   Under consideration:   Diagnostic right caudal ESI #1 + diagnostic epidurogram #1  Possible right Suprascapular nerveRFA Possible left lumbar facet RFA  Diagnostic right knee genicular nerve block.  Possible right knee genicular nerve RFA   Therapeutic/palliative (PRN):   Palliative right lumbar facet block #4  Palliative left lumbar facet block #3  Palliative right lumbar facet RFA #2  (Right side done 04/13/2016) (100% relief) Diagnostic right Suprascapular nerve Block#2     Recent Visits Date Type Provider Dept  11/04/19 Office  Visit Milinda Pointer, MD [facility]  Showing recent visits within past 90 days and meeting all other requirements Today's Visits Date Type Provider Dept  01/29/20 Office Visit Milinda Pointer, MD [facility]  Showing today's visits and meeting  all other requirements Future Appointments No visits were found meeting these conditions. Showing future appointments within next 90 days and meeting all other requirements  I discussed the assessment and treatment plan with the patient. The patient was provided an opportunity to ask questions and all were answered. The patient agreed with the plan and demonstrated an understanding of the instructions.  Patient advised to call back or seek an in-person evaluation if the symptoms or condition worsens.  Duration of encounter: 30 minutes.  Note by: Gaspar Cola, MD Date: 01/29/2020; Time: 9:38 AM

## 2020-01-29 ENCOUNTER — Other Ambulatory Visit: Payer: Self-pay

## 2020-01-29 ENCOUNTER — Encounter: Payer: Self-pay | Admitting: Pain Medicine

## 2020-01-29 ENCOUNTER — Ambulatory Visit: Payer: BC Managed Care – PPO | Attending: Pain Medicine | Admitting: Pain Medicine

## 2020-01-29 VITALS — BP 149/84 | HR 87 | Temp 98.2°F | Resp 16 | Ht 66.0 in | Wt 250.0 lb

## 2020-01-29 DIAGNOSIS — G894 Chronic pain syndrome: Secondary | ICD-10-CM | POA: Diagnosis not present

## 2020-01-29 DIAGNOSIS — M25561 Pain in right knee: Secondary | ICD-10-CM | POA: Diagnosis not present

## 2020-01-29 DIAGNOSIS — Z79899 Other long term (current) drug therapy: Secondary | ICD-10-CM | POA: Insufficient documentation

## 2020-01-29 DIAGNOSIS — M961 Postlaminectomy syndrome, not elsewhere classified: Secondary | ICD-10-CM | POA: Insufficient documentation

## 2020-01-29 DIAGNOSIS — M5417 Radiculopathy, lumbosacral region: Secondary | ICD-10-CM | POA: Diagnosis present

## 2020-01-29 DIAGNOSIS — G8929 Other chronic pain: Secondary | ICD-10-CM | POA: Insufficient documentation

## 2020-01-29 DIAGNOSIS — M25511 Pain in right shoulder: Secondary | ICD-10-CM | POA: Diagnosis present

## 2020-01-29 DIAGNOSIS — M545 Low back pain, unspecified: Secondary | ICD-10-CM | POA: Insufficient documentation

## 2020-01-29 MED ORDER — OXYCODONE-ACETAMINOPHEN 5-325 MG PO TABS: 1.0000 | ORAL_TABLET | Freq: Four times a day (QID) | ORAL | 0 refills | Status: DC | PRN

## 2020-01-29 NOTE — Progress Notes (Signed)
Nursing Pain Medication Assessment:  Safety precautions to be maintained throughout the outpatient stay will include: orient to surroundings, keep bed in low position, maintain call bell within reach at all times, provide assistance with transfer out of bed and ambulation.  Medication Inspection Compliance: Pill count conducted under aseptic conditions, in front of the patient. Neither the pills nor the bottle was removed from the patient's sight at any time. Once count was completed pills were immediately returned to the patient in their original bottle.  Medication: Oxycodone/APAP Pill/Patch Count: 68 of 120 pills remain Pill/Patch Appearance: Markings consistent with prescribed medication Bottle Appearance: Standard pharmacy container. Clearly labeled. Filled Date: 12/ 27 / 2022 Last Medication intake:  Today

## 2020-01-29 NOTE — Patient Instructions (Signed)
____________________________________________________________________________________________  Medication Rules  Purpose: To inform patients, and their family members, of our rules and regulations.  Applies to: All patients receiving prescriptions (written or electronic).  Pharmacy of record: Pharmacy where electronic prescriptions will be sent. If written prescriptions are taken to a different pharmacy, please inform the nursing staff. The pharmacy listed in the electronic medical record should be the one where you would like electronic prescriptions to be sent.  Electronic prescriptions: In compliance with the [place] Strengthen Opioid Misuse Prevention (STOP) Act of 2017 (Session Law 2017-74/H243), effective January 17, 2018, all controlled substances must be electronically prescribed. Calling prescriptions to the pharmacy will cease to exist.  Prescription refills: Only during scheduled appointments. Applies to all prescriptions.  NOTE: The following applies primarily to controlled substances (Opioid* Pain Medications).   Type of encounter (visit): For patients receiving controlled substances, face-to-face visits are required. (Not an option or up to the patient.)  Patient's responsibilities: 1. Pain Pills: Bring all pain pills to every appointment (except for procedure appointments). 2. Pill Bottles: Bring pills in original pharmacy bottle. Always bring the newest bottle. Bring bottle, even if empty. 3. Medication refills: You are responsible for knowing and keeping track of what medications you take and those you need refilled. The day before your appointment: write a list of all prescriptions that need to be refilled. The day of the appointment: give the list to the admitting nurse. Prescriptions will be written only during appointments. No prescriptions will be written on procedure days. If you forget a medication: it will not be "Called in", "Faxed", or "electronically sent".  You will need to get another appointment to get these prescribed. No early refills. Do not call asking to have your prescription filled early. 4. Prescription Accuracy: You are responsible for carefully inspecting your prescriptions before leaving our office. Have the discharge nurse carefully go over each prescription with you, before taking them home. Make sure that your name is accurately spelled, that your address is correct. Check the name and dose of your medication to make sure it is accurate. Check the number of pills, and the written instructions to make sure they are clear and accurate. Make sure that you are given enough medication to last until your next medication refill appointment. 5. Taking Medication: Take medication as prescribed. When it comes to controlled substances, taking less pills or less frequently than prescribed is permitted and encouraged. Never take more pills than instructed. Never take medication more frequently than prescribed.  6. Inform other Doctors: Always inform, all of your healthcare providers, of all the medications you take. 7. Pain Medication from other Providers: You are not allowed to accept any additional pain medication from any other Doctor or Healthcare provider. There are two exceptions to this rule. (see below) In the event that you require additional pain medication, you are responsible for notifying us, as stated below. 8. Cough Medicine: Often these contain an opioid, such as codeine or hydrocodone. Never accept or take cough medicine containing these opioids if you are already taking an opioid* medication. The combination may cause respiratory failure and death. 9. Medication Agreement: You are responsible for carefully reading and following our Medication Agreement. This must be signed before receiving any prescriptions from our practice. Safely store a copy of your signed Agreement. Violations to the Agreement will result in no further prescriptions.  (Additional copies of our Medication Agreement are available upon request.) 10. Laws, Rules, & Regulations: All patients are expected to follow all   Federal and State Laws, Statutes, Rules, & Regulations. Ignorance of the Laws does not constitute a valid excuse.  11. Illegal drugs and Controlled Substances: The use of illegal substances (including, but not limited to marijuana and its derivatives) and/or the illegal use of any controlled substances is strictly prohibited. Violation of this rule may result in the immediate and permanent discontinuation of any and all prescriptions being written by our practice. The use of any illegal substances is prohibited. 12. Adopted CDC guidelines & recommendations: Target dosing levels will be at or below 60 MME/day. Use of benzodiazepines** is not recommended.  Exceptions: There are only two exceptions to the rule of not receiving pain medications from other Healthcare Providers. 1. Exception #1 (Emergencies): In the event of an emergency (i.e.: accident requiring emergency care), you are allowed to receive additional pain medication. However, you are responsible for: As soon as you are able, call our office [phone], at any time of the day or night, and leave a message stating your name, the date and nature of the emergency, and the name and dose of the medication prescribed. In the event that your call is answered by a member of our staff, make sure to document and save the date, time, and the name of the person that took your information.  2. Exception #2 (Planned Surgery): In the event that you are scheduled by another doctor or dentist to have any type of surgery or procedure, you are allowed (for a period no longer than 30 days), to receive additional pain medication, for the acute post-op pain. However, in this case, you are responsible for picking up a copy of our "Post-op Pain Management for Surgeons" handout, and giving it to your surgeon or dentist. This  document is available at our office, and does not require an appointment to obtain it. Simply go to our office during business hours (Monday-Thursday from 8:00 AM to 4:00 PM) (Friday 8:00 AM to 12:00 Noon) or if you have a scheduled appointment with us, prior to your surgery, and ask for it by name. In addition, you are responsible for: calling our office [phone], at any time of the day or night, and leaving a message stating your name, name of your surgeon, type of surgery, and date of procedure or surgery. Failure to comply with your responsibilities may result in termination of therapy involving the controlled substances.  *Opioid medications include: morphine, codeine, oxycodone, oxymorphone, hydrocodone, hydromorphone, meperidine, tramadol, tapentadol, buprenorphine, fentanyl, methadone. **Benzodiazepine medications include: diazepam (Valium), alprazolam (Xanax), clonazepam (Klonopine), lorazepam (Ativan), clorazepate (Tranxene), chlordiazepoxide (Librium), estazolam (Prosom), oxazepam (Serax), temazepam (Restoril), triazolam (Halcion) (Last updated: 12/16/2019) ____________________________________________________________________________________________   ____________________________________________________________________________________________  Medication Recommendations and Reminders  Applies to: All patients receiving prescriptions (written and/or electronic).  Medication Rules & Regulations: These rules and regulations exist for your safety and that of others. They are not flexible and neither are we. Dismissing or ignoring them will be considered "non-compliance" with medication therapy, resulting in complete and irreversible termination of such therapy. (See document titled "Medication Rules" for more details.) In all conscience, because of safety reasons, we cannot continue providing a therapy where the patient does not follow instructions.  Pharmacy of record:   Definition:  This is the pharmacy where your electronic prescriptions will be sent.   We do not endorse any particular pharmacy, however, we have experienced problems with [facility] not securing enough medication supply for the community.  We do not restrict you in your choice of pharmacy. However,   once we write for your prescriptions, we will NOT be re-sending more prescriptions to fix restricted supply problems created by your pharmacy, or your insurance.   The pharmacy listed in the electronic medical record should be the one where you want electronic prescriptions to be sent.  If you choose to change pharmacy, simply notify our nursing staff.  Recommendations:  Keep all of your pain medications in a safe place, under lock and key, even if you live alone. We will NOT replace lost, stolen, or damaged medication.  After you fill your prescription, take 1 week's worth of pills and put them away in a safe place. You should keep a separate, properly labeled bottle for this purpose. The remainder should be kept in the original bottle. Use this as your primary supply, until it runs out. Once it's gone, then you know that you have 1 week's worth of medicine, and it is time to come in for a prescription refill. If you do this correctly, it is unlikely that you will ever run out of medicine.  To make sure that the above recommendation works, it is very important that you make sure your medication refill appointments are scheduled at least 1 week before you run out of medicine. To do this in an effective manner, make sure that you do not leave the office without scheduling your next medication management appointment. Always ask the nursing staff to show you in your prescription , when your medication will be running out. Then arrange for the receptionist to get you a return appointment, at least 7 days before you run out of medicine. Do not wait until you have 1 or 2 pills left, to come in. This is very poor planning and  does not take into consideration that we may need to cancel appointments due to bad weather, sickness, or emergencies affecting our staff.  DO NOT ACCEPT A "Partial Fill": If for any reason your pharmacy does not have enough pills/tablets to completely fill or refill your prescription, do not allow for a "partial fill". The law allows the pharmacy to complete that prescription within 72 hours, without requiring a new prescription. If they do not fill the rest of your prescription within those 72 hours, you will need a separate prescription to fill the remaining amount, which we will NOT provide. If the reason for the partial fill is your insurance, you will need to talk to the pharmacist about payment alternatives for the remaining tablets, but again, DO NOT ACCEPT A PARTIAL FILL, unless you can trust your pharmacist to obtain the remainder of the pills within 72 hours.  Prescription refills and/or changes in medication(s):   Prescription refills, and/or changes in dose or medication, will be conducted only during scheduled medication management appointments. (Applies to both, written and electronic prescriptions.)  No refills on procedure days. No medication will be changed or started on procedure days. No changes, adjustments, and/or refills will be conducted on a procedure day. Doing so will interfere with the diagnostic portion of the procedure.  No phone refills. No medications will be "called into the pharmacy".  No Fax refills.  No weekend refills.  No Holliday refills.  No after hours refills.  Remember:  Business hours are:  Monday to Thursday 8:00 AM to 4:00 PM Provider's Schedule: Celie Desrochers, MD - Appointments are:  Medication management: Monday and Wednesday 8:00 AM to 4:00 PM Procedure day: Tuesday and Thursday 7:30 AM to 4:00 PM Bilal Lateef, MD - Appointments are:    Medication management: Tuesday and Thursday 8:00 AM to 4:00 PM Procedure day: Monday and Wednesday  7:30 AM to 4:00 PM (Last update: 08/07/2019) ____________________________________________________________________________________________   ____________________________________________________________________________________________  CBD (cannabidiol) WARNING  Applicable to: All individuals currently taking or considering taking CBD (cannabidiol) and, more important, all patients taking opioid analgesic controlled substances (pain medication). (Example: oxycodone; oxymorphone; hydrocodone; hydromorphone; morphine; methadone; tramadol; tapentadol; fentanyl; buprenorphine; butorphanol; dextromethorphan; meperidine; codeine; etc.)  Legal status: CBD remains a Schedule I drug prohibited for any use. CBD is illegal with one exception. In the [place], CBD has a limited Food and Drug Administration (FDA) approval for the treatment of two specific types of epilepsy disorders. Only one CBD product has been approved by the FDA for this purpose: "Epidiolex". FDA is aware that some companies are marketing products containing cannabis and cannabis-derived compounds in ways that violate the Federal Food, Drug and Cosmetic Act (FD&C Act) and that may put the health and safety of consumers at risk. The FDA, a Federal agency, has not enforced the CBD status since 2018.   Legality: Some manufacturers ship CBD products nationally, which is illegal. Often such products are sold online and are therefore available throughout the country. CBD is openly sold in head shops and health food stores in some states where such sales have not been explicitly legalized. Selling unapproved products with unsubstantiated therapeutic claims is not only a violation of the law, but also can put patients at risk, as these products have not been proven to be safe or effective. Federal illegality makes it difficult to conduct research on CBD.  Reference: "FDA Regulation of Cannabis and Cannabis-Derived Products, Including Cannabidiol  (CBD)" - https://www.fda.gov/news-events/public-health-focus/fda-regulation-cannabis-and-cannabis-derived-products-including-cannabidiol-cbd  Warning: CBD is not FDA approved and has not undergo the same manufacturing controls as prescription drugs.  This means that the purity and safety of available CBD may be questionable. Most of the time, despite manufacturer's claims, it is contaminated with THC (delta-9-tetrahydrocannabinol - the chemical in marijuana responsible for the "HIGH").  When this is the case, the THC contaminant will trigger a positive urine drug screen (UDS) test for Marijuana (carboxy-THC). Because a positive UDS for any illicit substance is a violation of our medication agreement, your opioid analgesics (pain medicine) may be permanently discontinued.  MORE ABOUT CBD  General Information: CBD  is a derivative of the Marijuana (cannabis sativa) plant discovered in 1940. It is one of the 113 identified substances found in Marijuana. It accounts for up to 40% of the plant's extract. As of 2018, preliminary clinical studies on CBD included research for the treatment of anxiety, movement disorders, and pain. CBD is available and consumed in multiple forms, including inhalation of smoke or vapor, as an aerosol spray, and by mouth. It may be supplied as an oil containing CBD, capsules, dried cannabis, or as a liquid solution. CBD is thought not to be as psychoactive as THC (delta-9-tetrahydrocannabinol - the chemical in marijuana responsible for the "HIGH"). Studies suggest that CBD may interact with different biological target receptors in the body, including cannabinoid and other neurotransmitter receptors. As of 2018 the mechanism of action for its biological effects has not been determined.  Side-effects  Adverse reactions: Dry mouth, diarrhea, decreased appetite, fatigue, drowsiness, malaise, weakness, sleep disturbances, and others.  Drug interactions: CBC may interact with other  medications such as blood-thinners. (Last update: 08/24/2019) ____________________________________________________________________________________________   ____________________________________________________________________________________________  Drug Holidays (Slow)  What is a "Drug Holiday"? Drug Holiday: is the name given to the period of time during   which a patient stops taking a medication(s) for the purpose of eliminating tolerance to the drug.  Benefits . Improved effectiveness of opioids. . Decreased opioid dose needed to achieve benefits. . Improved pain with lesser dose.  What is tolerance? Tolerance: is the progressive decreased in effectiveness of a drug due to its repetitive use. With repetitive use, the body gets use to the medication and as a consequence, it loses its effectiveness. This is a common problem seen with opioid pain medications. As a result, a larger dose of the drug is needed to achieve the same effect that used to be obtained with a smaller dose.  How long should a "Drug Holiday" last? You should stay off of the pain medicine for at least 14 consecutive days. (2 weeks)  Should I stop the medicine "cold turkey"? No. You should always coordinate with your Pain Specialist so that he/she can provide you with the correct medication dose to make the transition as smoothly as possible.  How do I stop the medicine? Slowly. You will be instructed to decrease the daily amount of pills that you take by one (1) pill every seven (7) days. This is called a "slow downward taper" of your dose. For example: if you normally take four (4) pills per day, you will be asked to drop this dose to three (3) pills per day for seven (7) days, then to two (2) pills per day for seven (7) days, then to one (1) per day for seven (7) days, and at the end of those last seven (7) days, this is when the "Drug Holiday" would start.   Will I have withdrawals? By doing a "slow downward  taper" like this one, it is unlikely that you will experience any significant withdrawal symptoms. Typically, what triggers withdrawals is the sudden stop of a high dose opioid therapy. Withdrawals can usually be avoided by slowly decreasing the dose over a prolonged period of time. If you do not follow these instructions and decide to stop your medication abruptly, withdrawals may be possible.  What are withdrawals? Withdrawals: refers to the wide range of symptoms that occur after stopping or dramatically reducing opiate drugs after heavy and prolonged use. Withdrawal symptoms do not occur to patients that use low dose opioids, or those who take the medication sporadically. Contrary to benzodiazepine (example: Valium, Xanax, etc.) or alcohol withdrawals ("Delirium Tremens"), opioid withdrawals are not lethal. Withdrawals are the physical manifestation of the body getting rid of the excess receptors.  Expected Symptoms Early symptoms of withdrawal may include: . Agitation . Anxiety . Muscle aches . Increased tearing . Insomnia . Runny nose . Sweating . Yawning  Late symptoms of withdrawal may include: . Abdominal cramping . Diarrhea . Dilated pupils . Goose bumps . Nausea . Vomiting  Will I experience withdrawals? Due to the slow nature of the taper, it is very unlikely that you will experience any.  What is a slow taper? Taper: refers to the gradual decrease in dose.  (Last update: 08/07/2019) ____________________________________________________________________________________________     

## 2020-02-03 ENCOUNTER — Encounter: Payer: BC Managed Care – PPO | Admitting: Pain Medicine

## 2020-04-16 ENCOUNTER — Other Ambulatory Visit: Payer: Self-pay | Admitting: Pain Medicine

## 2020-04-16 DIAGNOSIS — M792 Neuralgia and neuritis, unspecified: Secondary | ICD-10-CM

## 2020-05-10 NOTE — Progress Notes (Signed)
PROVIDER NOTE: Information contained herein reflects review and annotations entered in association with encounter. Interpretation of such information and data should be left to medically-trained personnel. Information provided to patient can be located elsewhere in the medical record under "Patient Instructions". Document created using STT-dictation technology, any transcriptional errors that may result from process are unintentional.    Patient: Adam Petty.  Service Category: E/M  Provider: Gaspar Cola, MD  DOB: 06-02-1960  DOS: 05/11/2020  Specialty: Interventional Pain Management  MRN: [id number]  Setting: Ambulatory outpatient  PCP: Ulyess Blossom, PA  Type: Established Patient    Referring Provider: Ulyess Blossom, PA  Location: Office  Delivery: Face-to-face     HPI  Mr. Adam Kronberg Ortencia Kick., a 60 y.o. year old male, is here today because of his Pharmacologic therapy [Z79.899]. Adam Petty primary complain today is Back Pain (Low right) Last encounter: My last encounter with him was on 04/16/2020. 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); Discogenic syndrome, lumbar (WC injury); Osteoarthritis of spine with radiculopathy, lumbar region (WC injury); Lumbar facet syndrome (WC injury) (Bilateral) (R>L); Lumbosacral radiculopathy (WC injury); Pain in right knee (WC injury); Lumbar canal stenosis; Neurogenic pain; Neuropathic pain; Musculoskeletal pain; Lumbar spondylosis; Chronic knee pain (Secondary area of Pain) (Right); Osteoarthritis of knee (Right); Chronic shoulder pain (Third area of Pain) (Right); Osteoarthritis of shoulder (Right); Chronic lower extremity pain (Right); Recurrent nephrolithiasis; Acute medial meniscal tear; Chronic pain syndrome (WC injury); Acute low back pain without  sciatica; Osteoarthritis of glenohumeral joint (Right); Chronic sacroiliac joint pain (Bilateral) (R>L); Spondylosis without myelopathy or radiculopathy, lumbosacral region; DDD (degenerative disc disease), lumbosacral; DDD (degenerative disc disease), thoracic; Lumbosacral radiculopathy (S1) (Right); and Long term current use of non-steroidal anti-inflammatories (NSAID) on their pertinent problem list. Pain Assessment: Severity of Chronic pain is reported as a 3 /10. Location: Back Lower,Right/radiates down back of right leg to knee at times. Onset: More than a month ago. Quality: Sharp. Timing: Constant. Modifying factor(s): heating pad. Vitals:  height is '5\' 6"'  (1.676 m) and weight is 260 lb (117.9 kg). His temperature is 97.3 F (36.3 C) (abnormal). His blood pressure is 147/83 (abnormal) and his pulse is 94. His respiration is 18 and oxygen saturation is 98%.   Reason for encounter: medication management.   The patient indicates doing well with the current medication regimen. No adverse reactions or side effects reported to the medications.   RTCB: 08/10/2020 Nonopioids transfer 11/04/2019: Neurontin  Pharmacotherapy Assessment   Analgesic: Oxycodone IR 5 mg, 1 tab PO q 6 hrs (20 mg/day)  MME/day: 30 mg/day.   Monitoring: [place] PMP: PDMP reviewed during this encounter.       Pharmacotherapy: No side-effects or adverse reactions reported. Compliance: No problems identified. Effectiveness: Clinically acceptable.  Dewayne Shorter, RN  05/11/2020  8:41 AM  Signed Nursing Pain Medication Assessment:  Safety precautions to be maintained throughout the outpatient stay will include: orient to surroundings, keep bed in low position, maintain call bell within reach at all times, provide assistance with transfer out of bed and ambulation.  Medication Inspection Compliance: Pill count conducted under aseptic conditions, in front of the patient. Neither the pills nor the bottle was removed from the patient's  sight at any time. Once count was completed pills were immediately returned to the patient in their original bottle.  Medication: Oxycodone/APAP Pill/Patch Count: 18 of 120 pills remain Pill/Patch Appearance: Markings consistent with prescribed medication Bottle Appearance: Standard pharmacy container. Clearly labeled. Filled Date: 03 / 31 / 2022 Last Medication intake:  Today    UDS:  Summary  Date Value Ref Range Status  07/01/2019 Note  Final    Comment:    ==================================================================== [employer] (MW) ==================================================================== Test                             Result       Flag       Units  Drug Present and Declared for Prescription Verification   Oxycodone                      854          EXPECTED   ng/mg creat   Oxymorphone                    1238         EXPECTED   ng/mg creat   Noroxycodone                   1109         EXPECTED   ng/mg creat   Noroxymorphone                 193          EXPECTED   ng/mg creat    Sources of oxycodone are scheduled prescription medications.    Oxymorphone, noroxycodone, and noroxymorphone are expected    metabolites of oxycodone. Oxymorphone is also available as a    scheduled prescription medication.  Drug Absent but Declared for Prescription Verification   Tapentadol                     Not Detected UNEXPECTED ng/mg creat ==================================================================== Test                      Result    Flag   Units      Ref Range   Creatinine              151              mg/dL      >=20 ==================================================================== Declared Medications:  The flagging and interpretation on this report are based on the  following declared medications.  Unexpected results may arise from  inaccuracies in the declared medications.   **Note: The testing scope of this panel includes these medications:    Oxycodone  Tapentadol   **Note: The testing scope of this panel does not include the  following reported medications:   Cyanocobalamin  Gabapentin (Neurontin)  Insulin  Metformin (Glucophage)  Naproxen (Aleve)  Potassium  Ramipril (Altace)  Rosuvastatin (Crestor)  Semaglutide  Testosterone  Topical ==================================================================== For clinical consultation, please call [phone]. ====================================================================      ROS  Constitutional: Denies any fever or chills Gastrointestinal: No reported hemesis, hematochezia, vomiting, or acute GI distress Musculoskeletal: Denies any acute onset joint swelling, redness, loss of ROM, or weakness Neurological: No reported episodes of acute onset apraxia, aphasia, dysarthria, agnosia, amnesia, paralysis, loss of coordination, or loss of consciousness  Medication Review  Semaglutide(0.25 or 0.5MG/DOS), Tapentadol HCl, Testosterone, cyanocobalamin, gabapentin, insulin aspart, insulin degludec, ketoconazole, metFORMIN, naproxen sodium, oxyCODONE-acetaminophen, potassium citrate, ramipril, and rosuvastatin  History Review  Allergy: Mr.  Petty has No Known Allergies. Drug: Adam Petty  reports no history of drug use. Alcohol:  reports current alcohol use. Tobacco:  reports that he has never smoked. He has never used smokeless tobacco. Social: Adam Petty  reports that he has never smoked. He has never used smokeless tobacco. He reports current alcohol use. He reports that he does not use drugs. Medical:  has a past medical history of Arthritis, senescent (10/22/2014), Back pain, Chronic pain syndrome (10/22/2014), Diabetes mellitus without complication ([place]), History of kidney stones, IBS (irritable bowel syndrome), Lateral meniscus tear, Preventative health care, Sleep apnea, and Spinal stenosis. Surgical: Adam Petty  has a past surgical history that includes  Tonsillectomy; Shoulder surgery; Palate surgery; Cervical fusion; Foot surgery; Lumbar laminectomy/decompression microdiscectomy (N/A, 02/06/2013); Knee arthroscopy (Right, 10/02/2013); Knee arthroscopy with medial menisectomy (Right, 03/25/2015); Knee bursectomy (Right, 03/25/2015); Cystoscopy w/ retrogrades (Right, 11/19/2015); Cystoscopy w/ retrogrades (Right, 10/03/2017); Ureteroscopy (Right, 10/03/2017); and CTR (Left, 02/27/2018). Family: family history includes COPD in his mother; Cancer in his mother; Diabetes in his father; Heart disease in his father and mother.  Laboratory Chemistry Profile   Renal Lab Results  Component Value Date   BUN 19 07/01/2019   CREATININE 1.08 07/01/2019   GFRAA >60 07/01/2019   GFRNONAA >60 07/01/2019     Hepatic Lab Results  Component Value Date   AST 26 07/01/2019   ALT 34 07/01/2019   ALBUMIN 4.2 07/01/2019   ALKPHOS 97 07/01/2019     Electrolytes Lab Results  Component Value Date   NA 140 07/01/2019   K 4.8 07/01/2019   CL 106 07/01/2019   CALCIUM 8.9 07/01/2019   MG 1.8 03/12/2015     Bone No results found for: VD25OH, VD125OH2TOT, EX5170YF7, CB4496PR9, 25OHVITD1, 25OHVITD2, 25OHVITD3, TESTOFREE, TESTOSTERONE   Inflammation (CRP: Acute Phase) (ESR: Chronic Phase) Lab Results  Component Value Date   CRP 0.6 03/12/2015   ESRSEDRATE 9 03/12/2015       Note: Above Lab results reviewed.  Recent Imaging Review  DG PAIN CLINIC C-ARM 1-60 MIN NO REPORT Fluoro was used, but no Radiologist interpretation will be provided.  Please refer to "NOTES" tab for provider progress note. Note: Reviewed        Physical Exam  General appearance: Well nourished, well developed, and well hydrated. In no apparent acute distress Mental status: Alert, oriented x 3 (person, place, & time)       Respiratory: No evidence of acute respiratory distress Eyes: PERLA Vitals: BP (!) 147/83   Pulse 94   Temp (!) 97.3 F (36.3 C)   Resp 18   Ht '5\' 6"'  (1.676 m)    Wt 260 lb (117.9 kg)   SpO2 98%   BMI 41.97 kg/m  BMI: Estimated body mass index is 41.97 kg/m as calculated from the following:   Height as of this encounter: '5\' 6"'  (1.676 m).   Weight as of this encounter: 260 lb (117.9 kg). Ideal: Ideal body weight: 63.8 kg (140 lb 10.5 oz) Adjusted ideal body weight: 85.5 kg (188 lb 6.3 oz)  Assessment   Status Diagnosis  Controlled Controlled Controlled 1. Pharmacologic therapy   2. Chronic pain syndrome   3. Chronic low back pain (WC injury) (Primary Area of Pain) (Bilateral) (R>L)   4. Chronic knee pain (Secondary area of Pain) (Right)   5. Lumbosacral radiculopathy (S1) (Right)   6. Chronic use of opiate for therapeutic purpose   7. Uncomplicated opioid dependence ([place])      Updated Problems:  Problem  Chronic Use of Opiate for Therapeutic Purpose    Plan of Care  Problem-specific:  No problem-specific Assessment & Plan notes found for this encounter.  Adam Petty. has a current medication list which includes the following long-term medication(s): [START ON 05/12/2020] oxycodone-acetaminophen, [START ON 06/11/2020] oxycodone-acetaminophen, [START ON 07/11/2020] oxycodone-acetaminophen, testosterone, and gabapentin.  Pharmacotherapy (Medications Ordered): Meds ordered this encounter  Medications  . oxyCODONE-acetaminophen (PERCOCET) 5-325 MG tablet    Sig: Take 1 tablet by mouth every 6 (six) hours as needed for severe pain. Must last 30 days.    Dispense:  120 tablet    Refill:  0    Not a duplicate. Do NOT delete! Dispense 1 day early if closed on refill date. Avoid benzodiazepines within 8 hours of opioids. Do not send refill requests.  Marland Kitchen oxyCODONE-acetaminophen (PERCOCET) 5-325 MG tablet    Sig: Take 1 tablet by mouth every 6 (six) hours as needed for severe pain. Must last 30 days.    Dispense:  120 tablet    Refill:  0    Not a duplicate. Do NOT delete! Dispense 1 day early if closed on refill date. Avoid  benzodiazepines within 8 hours of opioids. Do not send refill requests.  Marland Kitchen oxyCODONE-acetaminophen (PERCOCET) 5-325 MG tablet    Sig: Take 1 tablet by mouth every 6 (six) hours as needed for severe pain. Must last 30 days.    Dispense:  120 tablet    Refill:  0    Not a duplicate. Do NOT delete! Dispense 1 day early if closed on refill date. Avoid benzodiazepines within 8 hours of opioids. Do not send refill requests.   Orders:  No orders of the defined types were placed in this encounter.  Follow-up plan:   Return in about 3 months (around 08/10/2020) for (F2F), (MM).      Interventional treatment options: Planned, scheduled, and/or pending:   Diagnostic right caudal ESI #1 + diagnostic epidurogram #1 under fluoroscopic guidance and IV sedation.  The patient's last MRI was done in 2016 at a [facility] institution.  He continues to have pain that he describes as slowly worsening.  He seems to have 2 sources for this pain.  There is the pain in his back which clearly is coming from his facet joints, but there is also pain down his right lower extremity that seems to be an S1 lumbosacral sensory radiculopathy.  Since he does have a prior history of back surgery, it is very possible that this may have something to do with epidural fibrosis.  We will be scheduling the patient to come in for a diagnostic right-sided caudal ESI + epidurogram under fluoroscopic guidance and IV sedation to determine if this is the case.  Satiate 21   Under consideration:   Diagnostic right caudal ESI #1 + diagnostic epidurogram #1  Possible right Suprascapular nerveRFA Possible left lumbar facet RFA  Diagnostic right knee genicular nerve block.  Possible right knee genicular nerve RFA   Therapeutic/palliative (PRN):   Palliative right lumbar facet block #4  Palliative left lumbar facet block #3  Palliative right lumbar facet RFA #2  (Right side done 04/13/2016) (100% relief) Diagnostic right Suprascapular nerve  Block#2     Recent Visits No visits were found meeting these conditions. Showing recent visits within past 90 days and meeting all other requirements Today's Visits Date Type Provider Dept  05/11/20 Office Visit Milinda Pointer, MD [facility]  Showing today's visits and meeting  all other requirements Future Appointments No visits were found meeting these conditions. Showing future appointments within next 90 days and meeting all other requirements  I discussed the assessment and treatment plan with the patient. The patient was provided an opportunity to ask questions and all were answered. The patient agreed with the plan and demonstrated an understanding of the instructions.  Patient advised to call back or seek an in-person evaluation if the symptoms or condition worsens.  Duration of encounter: 30 minutes.  Note by: Gaspar Cola, MD Date: 05/11/2020; Time: 9:12 AM

## 2020-05-11 ENCOUNTER — Other Ambulatory Visit: Payer: Self-pay

## 2020-05-11 ENCOUNTER — Encounter: Payer: Self-pay | Admitting: Pain Medicine

## 2020-05-11 ENCOUNTER — Ambulatory Visit: Payer: BC Managed Care – PPO | Attending: Pain Medicine | Admitting: Pain Medicine

## 2020-05-11 VITALS — BP 147/83 | HR 94 | Temp 97.3°F | Resp 18 | Ht 66.0 in | Wt 260.0 lb

## 2020-05-11 DIAGNOSIS — Z79891 Long term (current) use of opiate analgesic: Secondary | ICD-10-CM | POA: Insufficient documentation

## 2020-05-11 DIAGNOSIS — Z79899 Other long term (current) drug therapy: Secondary | ICD-10-CM | POA: Diagnosis not present

## 2020-05-11 DIAGNOSIS — G8929 Other chronic pain: Secondary | ICD-10-CM | POA: Insufficient documentation

## 2020-05-11 DIAGNOSIS — G894 Chronic pain syndrome: Secondary | ICD-10-CM | POA: Diagnosis not present

## 2020-05-11 DIAGNOSIS — M5417 Radiculopathy, lumbosacral region: Secondary | ICD-10-CM | POA: Diagnosis present

## 2020-05-11 DIAGNOSIS — M545 Low back pain, unspecified: Secondary | ICD-10-CM | POA: Diagnosis not present

## 2020-05-11 DIAGNOSIS — M25561 Pain in right knee: Secondary | ICD-10-CM | POA: Insufficient documentation

## 2020-05-11 DIAGNOSIS — F1120 Opioid dependence, uncomplicated: Secondary | ICD-10-CM | POA: Insufficient documentation

## 2020-05-11 MED ORDER — OXYCODONE-ACETAMINOPHEN 5-325 MG PO TABS: 1.0000 | ORAL_TABLET | Freq: Four times a day (QID) | ORAL | 0 refills | Status: DC | PRN

## 2020-05-11 NOTE — Progress Notes (Signed)
Nursing Pain Medication Assessment:  Safety precautions to be maintained throughout the outpatient stay will include: orient to surroundings, keep bed in low position, maintain call bell within reach at all times, provide assistance with transfer out of bed and ambulation.  Medication Inspection Compliance: Pill count conducted under aseptic conditions, in front of the patient. Neither the pills nor the bottle was removed from the patient's sight at any time. Once count was completed pills were immediately returned to the patient in their original bottle.  Medication: Oxycodone/APAP Pill/Patch Count: 18 of 120 pills remain Pill/Patch Appearance: Markings consistent with prescribed medication Bottle Appearance: Standard pharmacy container. Clearly labeled. Filled Date: 03 / 31 / 2022 Last Medication intake:  Today

## 2020-06-09 ENCOUNTER — Telehealth: Payer: Self-pay | Admitting: Pain Medicine

## 2020-06-09 NOTE — Telephone Encounter (Signed)
Patient is notifying us that his urologist, Dr Artis Flock ordered Nucynta for kidney stones.

## 2020-06-11 ENCOUNTER — Telehealth: Payer: Self-pay | Admitting: Pain Medicine

## 2020-06-11 ENCOUNTER — Other Ambulatory Visit: Payer: Self-pay | Admitting: *Deleted

## 2020-06-11 ENCOUNTER — Other Ambulatory Visit: Payer: Self-pay | Admitting: Student in an Organized Health Care Education/Training Program

## 2020-06-11 DIAGNOSIS — G894 Chronic pain syndrome: Secondary | ICD-10-CM

## 2020-06-11 DIAGNOSIS — M545 Low back pain, unspecified: Secondary | ICD-10-CM

## 2020-06-11 DIAGNOSIS — M5417 Radiculopathy, lumbosacral region: Secondary | ICD-10-CM

## 2020-06-11 DIAGNOSIS — G8929 Other chronic pain: Secondary | ICD-10-CM

## 2020-06-11 MED ORDER — OXYCODONE-ACETAMINOPHEN 5-325 MG PO TABS: 1.0000 | ORAL_TABLET | Freq: Four times a day (QID) | ORAL | 0 refills | Status: DC | PRN

## 2020-06-11 NOTE — Progress Notes (Signed)
Requested Prescriptions   Signed Prescriptions Disp Refills  . oxyCODONE-acetaminophen (PERCOCET) 5-325 MG tablet 120 tablet 0    Sig: Take 1 tablet by mouth every 6 (six) hours as needed for severe pain. Must last 30 days.     Seeing nursing note

## 2020-06-11 NOTE — Telephone Encounter (Signed)
[facility] has called and states [facility] is [facility] but [facility] in [place] [facility] it.  I called back to talk to him and left a voicemail that I would try and have Dr Cherylann Ratel [facility] the [facility] in [place], that Dr Laban Emperor is not in the office.

## 2020-06-11 NOTE — Telephone Encounter (Signed)
Dr Cherylann Ratel has resent the Rx to [facility] in [place].

## 2020-06-11 NOTE — Telephone Encounter (Signed)
[facility] [address] to cancel Rx for oxycodone - apap 5 - 325 mg fillable today d/t no supply.  Dr Cherylann Ratel is going to resend to [facility] in [place]. Voicemail left because I was unable to get a person on the phone.

## 2020-06-11 NOTE — Telephone Encounter (Signed)
Pharmacy called stating [facility] [place] [facility] of percocet but [place] has plenty. Patient is due to fill and is going out of town tomorrow. Please call the pharmacy

## 2020-06-16 ENCOUNTER — Other Ambulatory Visit: Payer: Self-pay | Admitting: Pain Medicine

## 2020-06-16 NOTE — Telephone Encounter (Signed)
Thank you for letting me know.  I have noted it in the chart.

## 2020-06-16 NOTE — Progress Notes (Signed)
Nucynta ordered by urologist Dr. Sheppard Penton for acute pain from kidney stones.  Patient has notified us.  Patient remains compliant with our medication regulations.

## 2020-06-25 ENCOUNTER — Other Ambulatory Visit: Payer: Self-pay | Admitting: Urology

## 2020-06-25 DIAGNOSIS — R109 Unspecified abdominal pain: Secondary | ICD-10-CM

## 2020-07-08 ENCOUNTER — Ambulatory Visit: Admission: RE | Admit: 2020-07-08 | Payer: BC Managed Care – PPO | Source: Ambulatory Visit

## 2020-07-10 ENCOUNTER — Telehealth: Payer: Self-pay | Admitting: Pain Medicine

## 2020-07-13 ENCOUNTER — Other Ambulatory Visit: Payer: Self-pay

## 2020-07-13 ENCOUNTER — Ambulatory Visit (INDEPENDENT_AMBULATORY_CARE_PROVIDER_SITE_OTHER): Payer: BC Managed Care – PPO

## 2020-07-13 ENCOUNTER — Ambulatory Visit
Admission: EM | Admit: 2020-07-13 | Discharge: 2020-07-13 | Disposition: A | Discharge status: Home / Self Care | Payer: BC Managed Care – PPO | Attending: Family Medicine | Admitting: Family Medicine

## 2020-07-13 DIAGNOSIS — R059 Cough, unspecified: Secondary | ICD-10-CM

## 2020-07-13 DIAGNOSIS — J209 Acute bronchitis, unspecified: Secondary | ICD-10-CM | POA: Diagnosis present

## 2020-07-13 LAB — COMPREHENSIVE METABOLIC PANEL
ALT: 32 U/L (ref 0–44)
AST: 32 U/L (ref 15–41)
Albumin: 4.1 g/dL (ref 3.5–5.0)
Alkaline Phosphatase: 94 U/L (ref 38–126)
Anion gap: 5 (ref 5–15)
BUN: 18 mg/dL (ref 6–20)
CO2: 22 mmol/L (ref 22–32)
Calcium: 8.8 mg/dL — ABNORMAL LOW (ref 8.9–10.3)
Chloride: 109 mmol/L (ref 98–111)
Creatinine, Ser: 0.93 mg/dL (ref 0.61–1.24)
GFR, Estimated: >60 mL/min (ref >60)
Glucose, Bld: 168 mg/dL — ABNORMAL HIGH (ref 70–99)
Potassium: 4.6 mmol/L (ref 3.5–5.1)
Sodium: 136 mmol/L (ref 135–145)
Total Bilirubin: 0.2 mg/dL — ABNORMAL LOW (ref 0.3–1.2)
Total Protein: 8 g/dL (ref 6.5–8.1)

## 2020-07-13 LAB — CBC WITH DIFFERENTIAL/PLATELET
Abs Immature Granulocytes: 0.01 10*3/uL (ref 0.00–0.07)
Basophils Absolute: 0 10*3/uL (ref 0.0–0.1)
Basophils Relative: 1 %
Eosinophils Absolute: 0.3 10*3/uL (ref 0.0–0.5)
Eosinophils Relative: 6 %
HCT: 41.8 % (ref 39.0–52.0)
Hemoglobin: 14.3 g/dL (ref 13.0–17.0)
Immature Granulocytes: 0 %
Lymphocytes Relative: 36 %
Lymphs Abs: 1.5 10*3/uL (ref 0.7–4.0)
MCH: 29.2 pg (ref 26.0–34.0)
MCHC: 34.2 g/dL (ref 30.0–36.0)
MCV: 85.5 fL (ref 80.0–100.0)
Monocytes Absolute: 0.4 10*3/uL (ref 0.1–1.0)
Monocytes Relative: 10 %
Neutro Abs: 2 10*3/uL (ref 1.7–7.7)
Neutrophils Relative %: 47 %
Platelets: 208 10*3/uL (ref 150–400)
RBC: 4.89 MIL/uL (ref 4.22–5.81)
RDW: 13.7 % (ref 11.5–15.5)
WBC: 4.3 10*3/uL (ref 4.0–10.5)
nRBC: 0 % (ref 0.0–0.2)

## 2020-07-13 MED ORDER — PREDNISONE 50 MG PO TABS: ORAL_TABLET | ORAL | 0 refills | Status: DC

## 2020-07-13 MED ORDER — ALBUTEROL SULFATE HFA 108 (90 BASE) MCG/ACT IN AERS: 1.0000 | INHALATION_SPRAY | Freq: Four times a day (QID) | RESPIRATORY_TRACT | 0 refills | Status: DC | PRN

## 2020-07-13 MED ORDER — DOXYCYCLINE HYCLATE 100 MG PO CAPS: 100.0000 mg | ORAL_CAPSULE | Freq: Two times a day (BID) | ORAL | 0 refills | Status: DC

## 2020-07-13 NOTE — Telephone Encounter (Signed)
Have tried to call patient and now sent msg thru mychart. No response

## 2020-07-13 NOTE — Discharge Instructions (Addendum)
If you worsen, please go to the hospital.  Medication as prescribed  Take care  Dr. Adriana Simas

## 2020-07-13 NOTE — ED Provider Notes (Addendum)
[facility]    CSN: [id number] Arrival date & time: 07/13/20  0945  History   Chief Complaint Chief Complaint  Patient presents with   Cough    HPI  60 year old male presents for evaluation of the above.  Patient reports that he has had a dry cough for the past week.  He reports associated shortness of breath.  He is not a smoker.  He has never been a smoker.  He has been treating his cough with over-the-counter medication without relief.  Patient states that he has coughed so violently that he passed out last night.  He states that he has done this in the past.  Additionally, patient states that he just does not feel well.  He feels like he is in a "fog".  He has difficulty focusing and concentrating.  No documented fever.  No relieving factors.  Past Medical History:  Diagnosis Date   Arthritis, senescent 10/22/2014   Back pain    history spinal stenosis, DDD   Chronic pain syndrome 10/22/2014   Diabetes mellitus without complication (HCC)    History of kidney stones    IBS (irritable bowel syndrome)    Lateral meniscus tear    rt knee   Preventative health care    takes crestor/ altace for "preventative reasons" denies heart or BP problems   Sleep apnea    had UPPP surgery and now sleep apnea resolved   Spinal stenosis     Patient Active Problem List   Diagnosis Date Noted   Chronic use of opiate for therapeutic purpose 05/11/2020   Long term current use of non-steroidal anti-inflammatories (NSAID) 07/01/2019   Pharmacologic therapy 06/24/2019   Lumbosacral radiculopathy (S1) (Right) 04/17/2019   Spondylosis without myelopathy or radiculopathy, lumbosacral region 04/02/2019   DDD (degenerative disc disease), lumbosacral 04/02/2019   DDD (degenerative disc disease), thoracic 04/02/2019   Hepatitis C antibody test positive 11/20/2018   Hypertension associated with diabetes (HCC) 04/18/2018   B12 deficiency 09/08/2017   Chronic sacroiliac joint pain  (Bilateral) (R>L) 08/25/2016   Osteoarthritis of glenohumeral joint (Right) 06/16/2016   Acute low back pain without sciatica 05/05/2016   Chronic pain syndrome (WC injury) 02/09/2016   Acute medial meniscal tear 03/25/2015   Chronic knee pain (Secondary area of Pain) (Right) 03/12/2015   Osteoarthritis of knee (Right) 03/12/2015   Chronic shoulder pain (Third area of Pain) (Right) 03/12/2015   Osteoarthritis of shoulder (Right) 03/12/2015   Chronic lower extremity pain (Right) 03/12/2015   Recurrent nephrolithiasis 03/12/2015   Lumbar spondylosis 02/24/2015   Long term prescription opiate use 12/22/2014   Encounter for chronic pain management 12/22/2014   Neurogenic pain 12/22/2014   Neuropathic pain 12/22/2014   Musculoskeletal pain 12/22/2014   Chronic low back pain (WC injury) (Primary Area of Pain) (Bilateral) (R>L) 10/22/2014   Long term current use of opiate analgesic 10/22/2014   Encounter for therapeutic drug level monitoring 10/22/2014   Opiate use (30 MME/Day) 10/22/2014   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   Discogenic syndrome, lumbar (WC injury) 10/22/2014   Osteoarthritis of spine with radiculopathy, lumbar region (WC injury) 10/22/2014   Lumbar facet syndrome (WC injury) (Bilateral) (R>L) 10/22/2014   Lumbosacral radiculopathy (WC injury) 10/22/2014   Pain in right knee (WC injury) 10/22/2014   Uncomplicated opioid dependence (HCC) 10/22/2014   Therapeutic opioid-induced constipation (OIC) 10/22/2014   Lateral meniscal tear 10/01/2013   Microalbuminuria 09/12/2013   Type 2 diabetes mellitus with hyperglycemia, with  long-term current use of insulin (HCC) 09/12/2013   Obesity, Class III, BMI 40-49.9 (morbid obesity) (HCC) 09/10/2013   Dyslipidemia 09/10/2013   Adiposity 09/10/2013   Hyperlipidemia due to type 2 diabetes mellitus (HCC) 09/10/2013   Spinal stenosis of lumbar region 02/06/2013   Lumbar canal stenosis 02/06/2013    Past  Surgical History:  Procedure Laterality Date   CERVICAL FUSION     CTR Left 02/27/2018   CTR and trigger finger repair   CYSTOSCOPY W/ RETROGRADES Right 11/19/2015   Procedure: URETEROSCOPY WITH RETROGRADE PYELOGRAM;  Surgeon: Orson Ape, MD;  Location: [facility];  Service: Urology;  Laterality: Right;   CYSTOSCOPY W/ RETROGRADES Right 10/03/2017   Procedure: CYSTOSCOPY WITH RETROGRADE PYELOGRAM;  Surgeon: Orson Ape, MD;  Location: [facility];  Service: Urology;  Laterality: Right;   FOOT SURGERY     RT   KNEE ARTHROSCOPY Right 10/02/2013   Procedure: RIGHT KNEE ARTHROSCOPY, PARTIAL LATERAL MENISCECTOMY, DEBRIDEMENT, MEDIAL AND LATERAL CHONDROPLASTY;  Surgeon: Loanne Drilling, MD;  Location: [facility];  Service: Orthopedics;  Laterality: Right;   KNEE ARTHROSCOPY WITH MEDIAL MENISECTOMY Right 03/25/2015   Procedure: RIGHT KNEE ARTHROSCOPY WITH MENISCAL DEBRIDEMENT and chrodroplasty;  Surgeon: Ollen Gross, MD;  Location: [facility];  Service: Orthopedics;  Laterality: Right;   KNEE BURSECTOMY Right 03/25/2015   Procedure: RIGHT KNEE PREPATELLA BURSECTOMY;  Surgeon: Ollen Gross, MD;  Location: [facility];  Service: Orthopedics;  Laterality: Right;   LUMBAR LAMINECTOMY/DECOMPRESSION MICRODISCECTOMY N/A 02/06/2013   Procedure: MICRO LUMBAR DECOMPRESSION L3-L4 and L4 - L5 ;  Surgeon: Javier Docker, MD;  Location: [facility];  Service: Orthopedics;  Laterality: N/A;   PALATE SURGERY     surg for sleep apnea and sinus issues   SHOULDER SURGERY     x5 on RT / x1 LFT   TONSILLECTOMY     URETEROSCOPY Right 10/03/2017   Procedure: URETEROSCOPY;  Surgeon: Orson Ape, MD;  Location: [facility];  Service: Urology;  Laterality: Right;       Home Medications    Prior to Admission medications   Medication Sig Start Date End Date Taking? Authorizing Provider  albuterol (VENTOLIN HFA) 108 (90 Base) MCG/ACT inhaler Inhale 1-2 puffs into the lungs every 6 (six) hours as needed for wheezing or shortness of  breath. 07/13/20  Yes Abisai Deer G, DO  doxycycline (VIBRAMYCIN) 100 MG capsule Take 1 capsule (100 mg total) by mouth 2 (two) times daily. 07/13/20  Yes Kassiah Mccrory G, DO  predniSONE (DELTASONE) 50 MG tablet 1 tablet daily x 5 days 07/13/20  Yes Yadir Zentner G, DO  cyanocobalamin (,VITAMIN B-12,) 1000 MCG/ML injection Inject into the muscle every 30 (thirty) days.  03/19/15   [provider]  gabapentin (NEURONTIN) 600 MG tablet Take 1 tablet (600 mg total) by mouth every 6 (six) hours. 11/18/19 02/16/20  Delano Metz, MD  insulin degludec (TRESIBA) 100 UNIT/ML FlexTouch Pen Inject 40 Units into the skin daily. Has been taking 30 01/14/16   [provider]  ketoconazole (NIZORAL) 2 % shampoo APPLY TO AFFECTED AREA EVERY DAY AS NEEDED 08/10/16   [provider]  metFORMIN (GLUCOPHAGE) 1000 MG tablet TAKE 1 TABLET (1,000 MG TOTAL) BY MOUTH 2 (TWO) TIMES DAILY WITH MEALS. 12/04/15   [provider]  naproxen sodium (ALEVE) 220 MG tablet Take 440 mg by mouth 2 (two) times daily as needed.     [provider]  NOVOLOG FLEXPEN 100 UNIT/ML FlexPen Inject 0-10 Units into the  skin daily as needed for high blood sugar (sliding scale). Reported on 02/24/2015 02/23/15   [provider]  oxyCODONE-acetaminophen (PERCOCET) 5-325 MG tablet Take 1 tablet by mouth every 6 (six) hours as needed for severe pain. Must last 30 days. 05/12/20 06/11/20  Delano Metz, MD  oxyCODONE-acetaminophen (PERCOCET) 5-325 MG tablet Take 1 tablet by mouth every 6 (six) hours as needed for severe pain. Must last 30 days. 07/11/20 08/10/20  Delano Metz, MD  oxyCODONE-acetaminophen (PERCOCET) 5-325 MG tablet Take 1 tablet by mouth every 6 (six) hours as needed for severe pain. Must last 30 days. 06/11/20 07/11/20  Edward Jolly, MD  potassium citrate (UROCIT-K) 10 MEQ (1080 MG) SR tablet Take 10 mEq by mouth 2 (two) times daily.    [provider]  ramipril (ALTACE) 5 MG  capsule Take 5 mg by mouth daily.    [provider]  rosuvastatin (CRESTOR) 5 MG tablet Take 5 mg by mouth 3 (three) times a week. Monday Wednesday Friday    [provider]  Semaglutide,0.25 or 0.5MG /DOS, 2 MG/1.5ML SOPN Inject 2 MBq into the skin every 30 (thirty) days. 12/18/17   [provider]  Tapentadol HCl (NUCYNTA PO) Take 1 tablet by mouth as needed. For kidney stones    [provider]  Testosterone 20.25 MG/ACT (1.62%) GEL Apply 2 Pump topically daily. 02/21/18   [provider]    Family History Family History  Problem Relation Age of Onset   Cancer Mother    Heart disease Mother    COPD Mother    Diabetes Father    Heart disease Father     Social History Social History   Tobacco Use   Smoking status: Never   Smokeless tobacco: Never  Vaping Use   Vaping Use: Never used  Substance Use Topics   Alcohol use: Yes    Comment: occasional   Drug use: No     Allergies   Patient has no known allergies.   Review of Systems Review of Systems  Constitutional:  Negative for fever.  Respiratory:  Positive for cough and shortness of breath.   Cardiovascular:  Negative for chest pain.  Neurological:  Positive for syncope.  Psychiatric/Behavioral:  Positive for decreased concentration.     Physical Exam Triage Vital Signs ED Triage Vitals  Enc Vitals Group     BP 07/13/20 1041 131/82     Pulse Rate 07/13/20 1041 80     Resp 07/13/20 1041 16     Temp 07/13/20 1041 98.3 F (36.8 C)     Temp Source 07/13/20 1041 Oral     SpO2 07/13/20 1041 97 %     Weight --      Height --      Head Circumference --      Peak Flow --      Pain Score 07/13/20 1039 0     Pain Loc --      Pain Edu? --      Excl. in GC? --    Updated Vital Signs BP 131/82 (BP Location: Right Arm)   Pulse 80   Temp 98.3 F (36.8 C) (Oral)   Resp 16   SpO2 97%   Visual Acuity Right Eye Distance:   Left Eye Distance:   Bilateral Distance:     Right Eye Near:   Left Eye Near:    Bilateral Near:     Physical Exam Vitals and nursing note reviewed.  Constitutional:  General: He is not in acute distress.    Appearance: He is obese.  HENT:     Head: Normocephalic and atraumatic.  Eyes:     General:        Right eye: No discharge.        Left eye: No discharge.     Conjunctiva/sclera: Conjunctivae normal.  Cardiovascular:     Rate and Rhythm: Normal rate and regular rhythm.  Pulmonary:     Effort: Pulmonary effort is normal.     Breath sounds: Wheezing present.  Neurological:     Mental Status: He is alert.  Psychiatric:     Comments: Flat affect.     UC Treatments / Results  Labs (all labs ordered are listed, but only abnormal results are displayed) Labs Reviewed  COMPREHENSIVE METABOLIC PANEL - Abnormal; Notable for the following components:      Result Value   Glucose, Bld 168 (*)    Calcium 8.8 (*)    Total Bilirubin 0.2 (*)    All other components within normal limits  CBC WITH DIFFERENTIAL/PLATELET    EKG   Radiology DG Chest 2 View  Result Date: 07/13/2020 CLINICAL DATA:  Cough EXAM: CHEST - 2 VIEW COMPARISON:  January 31, 2013 FINDINGS: Lungs are clear. Heart size and pulmonary vascularity normal. No evident adenopathy. There is degenerative change in the thoracic spine. There is postoperative change in the lower cervical region. IMPRESSION: Lungs clear.  Cardiac silhouette within normal limits. Electronically Signed   By: Bretta Bang III M.D.   On: 07/13/2020 11:36    Procedures Procedures (including critical care time)  Medications Ordered in UC Medications - No data to display  Initial Impression / Assessment and Plan / UC Course  I have reviewed the triage vital signs and the nursing notes.  Pertinent labs & imaging results that were available during my care of the patient were reviewed by me and considered in my medical decision making (see chart for details).    60 year old  male presents for evaluation of the above.   Patient's syncopal episodes likely from vasovagal due to coughing.  Chest x-ray was obtained and was independently reviewed by me.  Interpretation: Chest x-ray unremarkable.  No evidence of pneumonia.  Laboratory studies obtained given patient's history.  Notable for hyperglycemia.  Otherwise unremarkable.  Placing patient on doxycycline, albuterol, and prednisone.  Advised to go to the hospital if he worsens.  Final Clinical Impressions(s) / UC Diagnoses   Final diagnoses:  Acute bronchitis, unspecified organism     Discharge Instructions      If you worsen, please go to the hospital.  Medication as prescribed  Take care  Dr. Adriana Simas      ED Prescriptions     Medication Sig Dispense Auth. Provider   doxycycline (VIBRAMYCIN) 100 MG capsule Take 1 capsule (100 mg total) by mouth 2 (two) times daily. 14 capsule Teyana Pierron G, DO   predniSONE (DELTASONE) 50 MG tablet 1 tablet daily x 5 days 5 tablet Mylissa Lambe G, DO   albuterol (VENTOLIN HFA) 108 (90 Base) MCG/ACT inhaler Inhale 1-2 puffs into the lungs every 6 (six) hours as needed for wheezing or shortness of breath. 18 g Tommie Sams, DO      PDMP not reviewed this encounter.   Tommie Sams, DO 07/13/20 1226    Everlene Other [place], [place] 07/13/20 1227

## 2020-07-13 NOTE — ED Triage Notes (Signed)
Patient presents to Urgent Care with complaints of a dry cough x 1 week. Treating cough with OTC cough med with no relief.   Denies fever.

## 2020-08-05 ENCOUNTER — Other Ambulatory Visit: Payer: Self-pay

## 2020-08-05 ENCOUNTER — Ambulatory Visit: Payer: BC Managed Care – PPO | Attending: Pain Medicine | Admitting: Pain Medicine

## 2020-08-05 VITALS — BP 152/86 | HR 84 | Temp 97.2°F | Resp 16 | Ht 66.0 in | Wt 256.0 lb

## 2020-08-05 DIAGNOSIS — Z79899 Other long term (current) drug therapy: Secondary | ICD-10-CM | POA: Diagnosis present

## 2020-08-05 DIAGNOSIS — M5417 Radiculopathy, lumbosacral region: Secondary | ICD-10-CM

## 2020-08-05 DIAGNOSIS — M4697 Unspecified inflammatory spondylopathy, lumbosacral region: Secondary | ICD-10-CM

## 2020-08-05 DIAGNOSIS — M545 Low back pain, unspecified: Secondary | ICD-10-CM | POA: Diagnosis not present

## 2020-08-05 DIAGNOSIS — M25561 Pain in right knee: Secondary | ICD-10-CM

## 2020-08-05 DIAGNOSIS — G8929 Other chronic pain: Secondary | ICD-10-CM | POA: Diagnosis present

## 2020-08-05 DIAGNOSIS — M961 Postlaminectomy syndrome, not elsewhere classified: Secondary | ICD-10-CM | POA: Diagnosis present

## 2020-08-05 DIAGNOSIS — M79604 Pain in right leg: Secondary | ICD-10-CM

## 2020-08-05 DIAGNOSIS — M25511 Pain in right shoulder: Secondary | ICD-10-CM | POA: Diagnosis not present

## 2020-08-05 DIAGNOSIS — M5136 Other intervertebral disc degeneration, lumbar region with discogenic back pain only: Secondary | ICD-10-CM

## 2020-08-05 DIAGNOSIS — M5126 Other intervertebral disc displacement, lumbar region: Secondary | ICD-10-CM | POA: Diagnosis present

## 2020-08-05 DIAGNOSIS — M4726 Other spondylosis with radiculopathy, lumbar region: Secondary | ICD-10-CM | POA: Diagnosis present

## 2020-08-05 DIAGNOSIS — G894 Chronic pain syndrome: Secondary | ICD-10-CM | POA: Diagnosis not present

## 2020-08-05 DIAGNOSIS — Z79891 Long term (current) use of opiate analgesic: Secondary | ICD-10-CM

## 2020-08-05 MED ORDER — OXYCODONE-ACETAMINOPHEN 5-325 MG PO TABS: 1.0000 | ORAL_TABLET | Freq: Four times a day (QID) | ORAL | 0 refills | Status: DC | PRN

## 2020-08-05 MED ORDER — TURMERIC 500 MG PO CAPS: 500.0000 mg | ORAL_CAPSULE | Freq: Every day | ORAL | 0 refills | Status: AC

## 2020-08-05 NOTE — Progress Notes (Signed)
PROVIDER NOTE: Information contained herein reflects review and annotations entered in association with encounter. Interpretation of such information and data should be left to medically-trained personnel. Information provided to patient can be located elsewhere in the medical record under "Patient Instructions". Document created using STT-dictation technology, any transcriptional errors that may result from process are unintentional.    Patient: Adam Petty.  Service Category: E/M  Provider: Gaspar Cola, MD  DOB: 10-Oct-1960  DOS: 08/05/2020  Specialty: Interventional Pain Management  MRN: [id number]  Setting: Ambulatory outpatient  PCP: Ulyess Blossom, PA  Type: Established Patient    Referring Provider: Ulyess Blossom, PA  Location: Office  Delivery: Face-to-face     HPI  [name], a 60 y.o. year old male, is here today because of his Chronic pain syndrome [G89.4]. Mr. Winker primary complain today is Back Pain Last encounter: My last encounter with him was on 07/10/2020. Pertinent problems: Mr. 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); Discogenic syndrome, lumbar (WC injury); Osteoarthritis of spine with radiculopathy, lumbar region (WC injury); Lumbar facet syndrome (WC injury) (Bilateral) (R>L); Lumbosacral radiculopathy (WC injury); Lumbar canal stenosis; Neurogenic pain; Neuropathic pain; Musculoskeletal pain; Lumbar spondylosis; Chronic knee pain (2ry area of Pain) (WC injury) (Right); Osteoarthritis of knee (Right); Chronic shoulder pain (3ry area of Pain) (Right); Osteoarthritis of shoulder (Right); Chronic lower extremity pain (Right); Recurrent nephrolithiasis; Acute medial meniscal tear; Chronic pain syndrome (WC injury); Acute low back pain without sciatica; Osteoarthritis of glenohumeral  joint (Right); Chronic sacroiliac joint pain (Bilateral) (R>L); Spondylosis without myelopathy or radiculopathy, lumbosacral region; DDD (degenerative disc disease), lumbosacral; DDD (degenerative disc disease), thoracic; Lumbosacral radiculopathy (S1) (Right); and Inflammatory spondylopathy of lumbosacral region [facility]) on their pertinent problem list. Pain Assessment: Severity of Chronic pain is reported as a 4 /10. Location: Back Lower, Right/down side of right leg to knee. Onset: More than a month ago. Quality: Aching, Constant, Stabbing, Radiating. Timing: Constant. Modifying factor(s): medication, heat, rest. Vitals:  height is _0  (1.676 m) and weight is 256 lb (116.1 kg). His temperature is 97.2 F (36.2 C) (abnormal). His blood pressure is 152/86 (abnormal) and his pulse is 84. His respiration is 16 and oxygen saturation is 98%.   Reason for encounter: medication management.   The patient indicates doing well with the current medication regimen. No adverse reactions or side effects reported to the medications.  Today the patient indicated that he was given some oral steroids to help with a respiratory flareup and he noticed that this helped his back and lower extremity pain.  What this tells [place] is that though symptoms are probably sustained through chronic inflammation of the area.  I provided the patient with some information regarding this and the fact that the use of nonsteroidal anti-inflammatory drugs, as long as he does not have any kidney problems, should be beneficial based on the information that he has provided me.  However, he says that he takes Naprosyn 220 mg tablets, 2 tablets p.o. 3 times daily.  I took the opportunity to remind him that this is a rather high dose for naproxen.  I informed him that taking this type of medication can cause problems with his kidneys, increase his blood pressure, cause problems with hyperactive airway disease, and I also informed him that all NSAIDs have  been given  a black box warning about increasing the risk of adverse cardiovascular events such as myocardial infarction.  As an alternative, I recommended that he take over-the-counter turmeric and that he follow the inflammatory factor diet.  He continues to be morbidly obese at a BMI of 41.32 kg/m.  On several occasions I have informed him about the relationship between his obesity and he has back problems.  UDS ordered today.   RTCB: 11/08/2020 Nonopioids transfer 11/04/2019: Neurontin  Pharmacotherapy Assessment  Analgesic: Oxycodone IR 5 mg, 1 tab PO q 6 hrs (20 mg/day)  MME/day: 30 mg/day.   Monitoring: [place] PMP: PDMP reviewed during this encounter.       Pharmacotherapy: No side-effects or adverse reactions reported. Compliance: No problems identified. Effectiveness: Clinically acceptable.  Ignatius Specking, RN  08/05/2020  8:22 AM  Sign when Signing Visit Nursing Pain Medication Assessment:  Safety precautions to be maintained throughout the outpatient stay will include: orient to surroundings, keep bed in low position, maintain call bell within reach at all times, provide assistance with transfer out of bed and ambulation.  Medication Inspection Compliance: Pill count conducted under aseptic conditions, in front of the patient. Neither the pills nor the bottle was removed from the patient's sight at any time. Once count was completed pills were immediately returned to the patient in their original bottle.  Medication: Oxycodone/APAP Pill/Patch Count:  30 of 120 pills remain Pill/Patch Appearance: Markings consistent with prescribed medication Bottle Appearance: Standard pharmacy container. Clearly labeled. Filled Date: 5 / 26 / 2022 Last Medication intake:  Today     UDS:  Summary  Date Value Ref Range Status  07/01/2019 Note  Final    Comment:    ==================================================================== [employer]ox[employer] Select 13  (MW) ==================================================================== Test                             Result       Flag       Units  Drug Present and Declared for Prescription Verification   Oxycodone                      854          EXPECTED   ng/mg creat   Oxymorphone                    1238         EXPECTED   ng/mg creat   Noroxycodone                   1109         EXPECTED   ng/mg creat   Noroxymorphone                 193          EXPECTED   ng/mg creat    Sources of oxycodone are scheduled prescription medications.    Oxymorphone, noroxycodone, and noroxymorphone are expected    metabolites of oxycodone. Oxymorphone is also available as a    scheduled prescription medication.  Drug Absent but Declared for Prescription Verification   Tapentadol                     Not Detected UNEXPECTED ng/mg creat ==================================================================== Test                      Result    Flag   Units  Ref Range   Creatinine              151              mg/dL      >=20 ==================================================================== Declared Medications:  The flagging and interpretation on this report are based on the  following declared medications.  Unexpected results may arise from  inaccuracies in the declared medications.   **Note: The testing scope of this panel includes these medications:   Oxycodone  Tapentadol   **Note: The testing scope of this panel does not include the  following reported medications:   Cyanocobalamin  Gabapentin (Neurontin)  Insulin  Metformin (Glucophage)  Naproxen (Aleve)  Potassium  Ramipril (Altace)  Rosuvastatin (Crestor)  Semaglutide  Testosterone  Topical ==================================================================== For clinical consultation, please call [phone]. ====================================================================      ROS  Constitutional: Denies any fever or  chills Gastrointestinal: No reported hemesis, hematochezia, vomiting, or acute GI distress Musculoskeletal: Denies any acute onset joint swelling, redness, loss of ROM, or weakness Neurological: No reported episodes of acute onset apraxia, aphasia, dysarthria, agnosia, amnesia, paralysis, loss of coordination, or loss of consciousness  Medication Review  Semaglutide(0.25 or 0.5MG/DOS), Testosterone, Turmeric, albuterol, cyanocobalamin, doxycycline, gabapentin, insulin aspart, insulin degludec, ketoconazole, metFORMIN, naproxen sodium, oxyCODONE-acetaminophen, potassium citrate, ramipril, and rosuvastatin  History Review  Allergy: Mr. Capo has No Known Allergies. Drug: Mr. Latorre  reports no history of drug use. Alcohol:  reports current alcohol use. Tobacco:  reports that he has never smoked. He has never used smokeless tobacco. Social: Mr. Gholson  reports that he has never smoked. He has never used smokeless tobacco. He reports current alcohol use. He reports that he does not use drugs. 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 Surgical: Mr. Bless  has a past surgical history that includes Tonsillectomy; Shoulder surgery; Palate surgery; Cervical fusion; Foot surgery; Lumbar laminectomy/decompression microdiscectomy (N/A, 02/06/2013); Knee arthroscopy (Right, 10/02/2013); Knee arthroscopy with medial menisectomy (Right, 03/25/2015); Knee bursectomy (Right, 03/25/2015); Cystoscopy w/ retrogrades (Right, 11/19/2015); Cystoscopy w/ retrogrades (Right, 10/03/2017); Ureteroscopy (Right, 10/03/2017); and CTR (Left, 02/27/2018). Family: family history includes COPD in his mother; Cancer in his mother; Diabetes in his father; Heart disease in his  father and mother.  Laboratory Chemistry Profile   Renal Lab Results  Component Value Date   BUN 18 07/13/2020   CREATININE 0.93 07/13/2020   GFRAA >60 07/01/2019   GFRNONAA >60 07/13/2020    Hepatic Lab Results  Component Value Date   AST 32 07/13/2020   ALT 32 07/13/2020   ALBUMIN 4.1 07/13/2020   ALKPHOS 94 07/13/2020    Electrolytes Lab Results  Component Value Date   NA 136 07/13/2020   K 4.6 07/13/2020   CL 109 07/13/2020   CALCIUM 8.8 (L) 07/13/2020   MG 1.8 03/12/2015    Bone No results found for: VD25OH, VD125OH2TOT, OT1572IO0, BT5974BU3, 25OHVITD1, 25OHVITD2, 25OHVITD3, TESTOFREE, TESTOSTERONE  Inflammation (CRP: Acute Phase) (ESR: Chronic Phase) Lab Results  Component Value Date   CRP 0.6 03/12/2015   ESRSEDRATE 9 03/12/2015         Note: Above Lab results reviewed.  Recent Imaging Review  DG Chest 2 View CLINICAL DATA:  Cough  EXAM: CHEST - 2 VIEW  COMPARISON:  January 31, 2013  FINDINGS: Lungs are clear.  Heart size and pulmonary vascularity normal. No evident adenopathy. There is degenerative change in the thoracic spine. There is postoperative change in the lower cervical region.  IMPRESSION: Lungs clear.  Cardiac silhouette within normal limits.  Electronically Signed   By: Lowella Grip III M.D.   On: 07/13/2020 11:36 Note: Reviewed        Physical Exam  General appearance: Well nourished, well developed, and well hydrated. In no apparent acute distress Mental status: Alert, oriented x 3 (person, place, & time)       Respiratory: No evidence of acute respiratory distress Eyes: PERLA Vitals: BP (!) 152/86   Pulse 84   Temp (!) 97.2 F (36.2 C)   Resp 16   Ht _0  (1.676 m)   Wt 256 lb (116.1 kg)   SpO2 98%   BMI 41.32 kg/m  BMI: Estimated body mass index is 41.32 kg/m as calculated from the following:   Height as of this encounter: _1  (1.676 m).   Weight as of this encounter: 256 lb (116.1 kg). Ideal: Ideal body  weight: 63.8 kg (140 lb 10.5 oz) Adjusted ideal body weight: 84.7 kg (186 lb 12.7 oz)  Assessment   Status Diagnosis  Controlled Controlled Controlled 1. Chronic pain syndrome   2. Chronic low back pain (WC injury) (1ry area of Pain) (Bilateral) (R>L) w/o sciatica   3. Chronic knee pain (2ry area of Pain) (WC injury) (Right)   4. Chronic shoulder pain (3ry area of Pain) (Right)   5. Chronic lower extremity pain (Right)   6. Discogenic syndrome, lumbar (WC injury)   7. 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 Osteoarthritis of spine with radiculopathy, lumbar region (WC injury)   9. Pharmacologic therapy   10. Chronic use of opiate for therapeutic purpose   11. Encounter for medication management   12. Chronic low back pain (WC injury) (Primary Area of Pain) (Bilateral) (R>L)   13. Chronic knee pain (Secondary area of Pain) (Right)   14. Lumbosacral radiculopathy (S1) (Right)   15. Inflammatory spondylopathy of lumbosacral region [facility])      Updated Problems: Problem  Inflammatory Spondylopathy of Lumbosacral Region ([facility])  Chronic knee pain (2ry area of Pain) (WC injury) (Right)  Chronic shoulder pain (3ry area of Pain) (Right)  Chronic low back pain (WC injury) (1ry area of Pain) (Bilateral) (R>L) w/o sciatica  Long Term Current Use of Non-Steroidal Anti-Inflammatories (Nsaid)    Plan of Care  Problem-specific:  No problem-specific Assessment & Plan notes found for this encounter.  Mr. Laverne Klugh. has a current medication list which includes the following long-term medication(s): albuterol, [START ON 08/10/2020] oxycodone-acetaminophen, [START ON 09/09/2020] oxycodone-acetaminophen, [START ON 10/09/2020] oxycodone-acetaminophen, testosterone, and gabapentin.  Pharmacotherapy (Medications Ordered): Meds ordered this encounter  Medications   oxyCODONE-acetaminophen (PERCOCET) 5-325 MG tablet    Sig: Take 1 tablet by mouth every 6 (six) hours as needed for  severe pain. Must last 30 days.    Dispense:  120 tablet    Refill:  0    Not a duplicate. Do NOT delete! Dispense 1 day early if closed on refill date. Avoid benzodiazepines within 8 hours of opioids. Do not send refill requests.   oxyCODONE-acetaminophen (PERCOCET) 5-325 MG tablet    Sig: Take 1 tablet by mouth every 6 (six) hours as needed for severe pain. Must last 30 days.    Dispense:  120 tablet    Refill:  0  Not a duplicate. Do NOT delete! Dispense 1 day early if closed on refill date. Avoid benzodiazepines within 8 hours of opioids. Do not send refill requests.   oxyCODONE-acetaminophen (PERCOCET) 5-325 MG tablet    Sig: Take 1 tablet by mouth every 6 (six) hours as needed for severe pain. Must last 30 days.    Dispense:  120 tablet    Refill:  0    Not a duplicate. Do NOT delete! Dispense 1 day early if closed on refill date. Avoid benzodiazepines within 8 hours of opioids. Do not send refill requests.   Turmeric 500 MG CAPS    Sig: Take 500 mg by mouth daily.    Dispense:  90 capsule    Refill:  0    OTC Recommendation. Please provide information on: where to find; how to take; side-effects; adverse reactions; drug-to-drug interactions; and contraindications. If unavailable, recommend similar substitute.    Orders:  Orders Placed This Encounter  Procedures   ToxASSURE Select 13 (MW), Urine    Volume: 30 ml(s). Minimum 3 ml of urine is needed. Document temperature of fresh sample. Indications: Long term (current) use of opiate analgesic (U27.253)    Order Specific Question:   Release to patient    Answer:   Immediate    Follow-up plan:   Return in about 3 months (around 11/08/2020) for evaluation day (F2F) (MM).     Interventional treatment options: Planned, scheduled, and/or pending:   Diagnostic right caudal ESI #1 + diagnostic epidurogram #1 under fluoroscopic guidance and IV sedation.  The patient's last MRI was done in 2016 at a [facility] institution.  He continues to  have pain that he describes as slowly worsening.  He seems to have 2 sources for this pain.  There is the pain in his back which clearly is coming from his facet joints, but there is also pain down his right lower extremity that seems to be an S1 lumbosacral sensory radiculopathy.  Since he does have a prior history of back surgery, it is very possible that this may have something to do with epidural fibrosis.  We will be scheduling the patient to come in for a diagnostic right-sided caudal ESI + epidurogram under fluoroscopic guidance and IV sedation to determine if this is the case.  Satiate 21   Under consideration:   Diagnostic right caudal ESI #1 + diagnostic epidurogram #1  Possible right Suprascapular nerve RFA  Possible left lumbar facet RFA  Diagnostic right knee genicular nerve block .  Possible right knee genicular nerve RFA   Therapeutic/palliative (PRN):   Palliative right lumbar facet block #4  Palliative left lumbar facet block #3  Palliative right lumbar facet RFA #2  (Right side done 04/13/2016) (100% relief) Diagnostic right Suprascapular nerve Block #2      Recent Visits Date Type Provider Dept  05/11/20 Office Visit Milinda Pointer, MD [facility]  Showing recent visits within past 90 days and meeting all other requirements Today's Visits Date Type Provider Dept  08/05/20 Office Visit Milinda Pointer, MD [facility]  Showing today's visits and meeting all other requirements Future Appointments No visits were found meeting these conditions. Showing future appointments within next 90 days and meeting all other requirements I discussed the assessment and treatment plan with the patient. The patient was provided an opportunity to ask questions and all were answered. The patient agreed with the plan and demonstrated an understanding of the instructions.  Patient advised to call back or seek  an in-person evaluation if the symptoms or condition  worsens.  Duration of encounter: 30 minutes.  Note by: Gaspar Cola, MD Date: 08/05/2020; Time: 9:04 AM

## 2020-08-05 NOTE — Progress Notes (Signed)
Nursing Pain Medication Assessment:  Safety precautions to be maintained throughout the outpatient stay will include: orient to surroundings, keep bed in low position, maintain call bell within reach at all times, provide assistance with transfer out of bed and ambulation.  Medication Inspection Compliance: Pill count conducted under aseptic conditions, in front of the patient. Neither the pills nor the bottle was removed from the patient's sight at any time. Once count was completed pills were immediately returned to the patient in their original bottle.  Medication: Oxycodone/APAP Pill/Patch Count:  30 of 120 pills remain Pill/Patch Appearance: Markings consistent with prescribed medication Bottle Appearance: Standard pharmacy container. Clearly labeled. Filled Date: 5 / 26 / 2022 Last Medication intake:  Today

## 2020-08-05 NOTE — Patient Instructions (Addendum)
____________________________________________________________________________________________  Medication Rules  Purpose: To inform patients, and their family members, of our rules and regulations.  Applies to: All patients receiving prescriptions (written or electronic).  Pharmacy of record: Pharmacy where electronic prescriptions will be sent. If written prescriptions are taken to a different pharmacy, please inform the nursing staff. The pharmacy listed in the electronic medical record should be the one where you would like electronic prescriptions to be sent.  Electronic prescriptions: In compliance with the [place] Strengthen Opioid Misuse Prevention (STOP) Act of 2017 (Session Law 2017-74/H243), effective January 17, 2018, all controlled substances must be electronically prescribed. Calling prescriptions to the pharmacy will cease to exist.  Prescription refills: Only during scheduled appointments. Applies to all prescriptions.  NOTE: The following applies primarily to controlled substances (Opioid* Pain Medications).   Type of encounter (visit): For patients receiving controlled substances, face-to-face visits are required. (Not an option or up to the patient.)  Patient's responsibilities: Pain Pills: Bring all pain pills to every appointment (except for procedure appointments). Pill Bottles: Bring pills in original pharmacy bottle. Always bring the newest bottle. Bring bottle, even if empty. Medication refills: You are responsible for knowing and keeping track of what medications you take and those you need refilled. The day before your appointment: write a list of all prescriptions that need to be refilled. The day of the appointment: give the list to the admitting nurse. Prescriptions will be written only during appointments. No prescriptions will be written on procedure days. If you forget a medication: it will not be "Called in", "Faxed", or "electronically sent". You will  need to get another appointment to get these prescribed. No early refills. Do not call asking to have your prescription filled early. Prescription Accuracy: You are responsible for carefully inspecting your prescriptions before leaving our office. Have the discharge nurse carefully go over each prescription with you, before taking them home. Make sure that your name is accurately spelled, that your address is correct. Check the name and dose of your medication to make sure it is accurate. Check the number of pills, and the written instructions to make sure they are clear and accurate. Make sure that you are given enough medication to last until your next medication refill appointment. Taking Medication: Take medication as prescribed. When it comes to controlled substances, taking less pills or less frequently than prescribed is permitted and encouraged. Never take more pills than instructed. Never take medication more frequently than prescribed.  Inform other Doctors: Always inform, all of your healthcare providers, of all the medications you take. Pain Medication from other Providers: You are not allowed to accept any additional pain medication from any other Doctor or Healthcare provider. There are two exceptions to this rule. (see below) In the event that you require additional pain medication, you are responsible for notifying us, as stated below. Cough Medicine: Often these contain an opioid, such as codeine or hydrocodone. Never accept or take cough medicine containing these opioids if you are already taking an opioid* medication. The combination may cause respiratory failure and death. Medication Agreement: You are responsible for carefully reading and following our Medication Agreement. This must be signed before receiving any prescriptions from our practice. Safely store a copy of your signed Agreement. Violations to the Agreement will result in no further prescriptions. (Additional copies of our  Medication Agreement are available upon request.) Laws, Rules, & Regulations: All patients are expected to follow all Federal and State Laws, Statutes, Rules, & Regulations. Ignorance of   the Laws does not constitute a valid excuse.  Illegal drugs and Controlled Substances: The use of illegal substances (including, but not limited to marijuana and its derivatives) and/or the illegal use of any controlled substances is strictly prohibited. Violation of this rule may result in the immediate and permanent discontinuation of any and all prescriptions being written by our practice. The use of any illegal substances is prohibited. Adopted CDC guidelines & recommendations: Target dosing levels will be at or below 60 MME/day. Use of benzodiazepines** is not recommended.  Exceptions: There are only two exceptions to the rule of not receiving pain medications from other Healthcare Providers. Exception #1 (Emergencies): In the event of an emergency (i.e.: accident requiring emergency care), you are allowed to receive additional pain medication. However, you are responsible for: As soon as you are able, call our office [phone], at any time of the day or night, and leave a message stating your name, the date and nature of the emergency, and the name and dose of the medication prescribed. In the event that your call is answered by a member of our staff, make sure to document and save the date, time, and the name of the person that took your information.  Exception #2 (Planned Surgery): In the event that you are scheduled by another doctor or dentist to have any type of surgery or procedure, you are allowed (for a period no longer than 30 days), to receive additional pain medication, for the acute post-op pain. However, in this case, you are responsible for picking up a copy of our "Post-op Pain Management for Surgeons" handout, and giving it to your surgeon or dentist. This document is available at our office, and  does not require an appointment to obtain it. Simply go to our office during business hours (Monday-Thursday from 8:00 AM to 4:00 PM) (Friday 8:00 AM to 12:00 Noon) or if you have a scheduled appointment with us, prior to your surgery, and ask for it by name. In addition, you are responsible for: calling our office [phone], at any time of the day or night, and leaving a message stating your name, name of your surgeon, type of surgery, and date of procedure or surgery. Failure to comply with your responsibilities may result in termination of therapy involving the controlled substances.  *Opioid medications include: morphine, codeine, oxycodone, oxymorphone, hydrocodone, hydromorphone, meperidine, tramadol, tapentadol, buprenorphine, fentanyl, methadone. **Benzodiazepine medications include: diazepam (Valium), alprazolam (Xanax), clonazepam (Klonopine), lorazepam (Ativan), clorazepate (Tranxene), chlordiazepoxide (Librium), estazolam (Prosom), oxazepam (Serax), temazepam (Restoril), triazolam (Halcion) (Last updated: 12/16/2019) ____________________________________________________________________________________________  ____________________________________________________________________________________________  Medication Recommendations and Reminders  Applies to: All patients receiving prescriptions (written and/or electronic).  Medication Rules & Regulations: These rules and regulations exist for your safety and that of others. They are not flexible and neither are we. Dismissing or ignoring them will be considered "non-compliance" with medication therapy, resulting in complete and irreversible termination of such therapy. (See document titled "Medication Rules" for more details.) In all conscience, because of safety reasons, we cannot continue providing a therapy where the patient does not follow instructions.  Pharmacy of record:  Definition: This is the pharmacy where your electronic  prescriptions will be sent.  We do not endorse any particular pharmacy, however, we have experienced problems with [facility] not securing enough medication supply for the community. We do not restrict you in your choice of pharmacy. However, once we write for your prescriptions, we will NOT be re-sending more prescriptions to fix restricted supply problems   created by your pharmacy, or your insurance.  The pharmacy listed in the electronic medical record should be the one where you want electronic prescriptions to be sent. If you choose to change pharmacy, simply notify our nursing staff.  Recommendations: Keep all of your pain medications in a safe place, under lock and key, even if you live alone. We will NOT replace lost, stolen, or damaged medication. After you fill your prescription, take 1 week's worth of pills and put them away in a safe place. You should keep a separate, properly labeled bottle for this purpose. The remainder should be kept in the original bottle. Use this as your primary supply, until it runs out. Once it's gone, then you know that you have 1 week's worth of medicine, and it is time to come in for a prescription refill. If you do this correctly, it is unlikely that you will ever run out of medicine. To make sure that the above recommendation works, it is very important that you make sure your medication refill appointments are scheduled at least 1 week before you run out of medicine. To do this in an effective manner, make sure that you do not leave the office without scheduling your next medication management appointment. Always ask the nursing staff to show you in your prescription , when your medication will be running out. Then arrange for the receptionist to get you a return appointment, at least 7 days before you run out of medicine. Do not wait until you have 1 or 2 pills left, to come in. This is very poor planning and does not take into consideration that we may need to  cancel appointments due to bad weather, sickness, or emergencies affecting our staff. DO NOT ACCEPT A "Partial Fill": If for any reason your pharmacy does not have enough pills/tablets to completely fill or refill your prescription, do not allow for a "partial fill". The law allows the pharmacy to complete that prescription within 72 hours, without requiring a new prescription. If they do not fill the rest of your prescription within those 72 hours, you will need a separate prescription to fill the remaining amount, which we will NOT provide. If the reason for the partial fill is your insurance, you will need to talk to the pharmacist about payment alternatives for the remaining tablets, but again, DO NOT ACCEPT A PARTIAL FILL, unless you can trust your pharmacist to obtain the remainder of the pills within 72 hours.  Prescription refills and/or changes in medication(s):  Prescription refills, and/or changes in dose or medication, will be conducted only during scheduled medication management appointments. (Applies to both, written and electronic prescriptions.) No refills on procedure days. No medication will be changed or started on procedure days. No changes, adjustments, and/or refills will be conducted on a procedure day. Doing so will interfere with the diagnostic portion of the procedure. No phone refills. No medications will be "called into the pharmacy". No Fax refills. No weekend refills. No Holliday refills. No after hours refills.  Remember:  Business hours are:  Monday to Thursday 8:00 AM to 4:00 PM Provider's Schedule: Jakylah Bassinger, MD - Appointments are:  Medication management: Monday and Wednesday 8:00 AM to 4:00 PM Procedure day: Tuesday and Thursday 7:30 AM to 4:00 PM Bilal Lateef, MD - Appointments are:  Medication management: Tuesday and Thursday 8:00 AM to 4:00 PM Procedure day: Monday and Wednesday 7:30 AM to 4:00 PM (Last update:  08/07/2019) ____________________________________________________________________________________________  ____________________________________________________________________________________________  CBD (cannabidiol) WARNING    Applicable to: All individuals currently taking or considering taking CBD (cannabidiol) and, more important, all patients taking opioid analgesic controlled substances (pain medication). (Example: oxycodone; oxymorphone; hydrocodone; hydromorphone; morphine; methadone; tramadol; tapentadol; fentanyl; buprenorphine; butorphanol; dextromethorphan; meperidine; codeine; etc.)  Legal status: CBD remains a Schedule I drug prohibited for any use. CBD is illegal with one exception. In the [place], CBD has a limited Food and Drug Administration (FDA) approval for the treatment of two specific types of epilepsy disorders. Only one CBD product has been approved by the FDA for this purpose: "Epidiolex". FDA is aware that some companies are marketing products containing cannabis and cannabis-derived compounds in ways that violate the Federal Food, Drug and Cosmetic Act (FD&C Act) and that may put the health and safety of consumers at risk. The FDA, a Federal agency, has not enforced the CBD status since 2018.   Legality: Some manufacturers ship CBD products nationally, which is illegal. Often such products are sold online and are therefore available throughout the country. CBD is openly sold in head shops and health food stores in some states where such sales have not been explicitly legalized. Selling unapproved products with unsubstantiated therapeutic claims is not only a violation of the law, but also can put patients at risk, as these products have not been proven to be safe or effective. Federal illegality makes it difficult to conduct research on CBD.  Reference: "FDA Regulation of Cannabis and Cannabis-Derived Products, Including Cannabidiol (CBD)" -  https://www.fda.gov/news-events/public-health-focus/fda-regulation-cannabis-and-cannabis-derived-products-including-cannabidiol-cbd  Warning: CBD is not FDA approved and has not undergo the same manufacturing controls as prescription drugs.  This means that the purity and safety of available CBD may be questionable. Most of the time, despite manufacturer's claims, it is contaminated with THC (delta-9-tetrahydrocannabinol - the chemical in marijuana responsible for the "HIGH").  When this is the case, the THC contaminant will trigger a positive urine drug screen (UDS) test for Marijuana (carboxy-THC). Because a positive UDS for any illicit substance is a violation of our medication agreement, your opioid analgesics (pain medicine) may be permanently discontinued.  MORE ABOUT CBD  General Information: CBD  is a derivative of the Marijuana (cannabis sativa) plant discovered in 1940. It is one of the 113 identified substances found in Marijuana. It accounts for up to 40% of the plant's extract. As of 2018, preliminary clinical studies on CBD included research for the treatment of anxiety, movement disorders, and pain. CBD is available and consumed in multiple forms, including inhalation of smoke or vapor, as an aerosol spray, and by mouth. It may be supplied as an oil containing CBD, capsules, dried cannabis, or as a liquid solution. CBD is thought not to be as psychoactive as THC (delta-9-tetrahydrocannabinol - the chemical in marijuana responsible for the "HIGH"). Studies suggest that CBD may interact with different biological target receptors in the body, including cannabinoid and other neurotransmitter receptors. As of 2018 the mechanism of action for its biological effects has not been determined.  Side-effects  Adverse reactions: Dry mouth, diarrhea, decreased appetite, fatigue, drowsiness, malaise, weakness, sleep disturbances, and others.  Drug interactions: CBC may interact with other medications  such as blood-thinners. (Last update: 08/24/2019) ____________________________________________________________________________________________  ____________________________________________________________________________________________  Drug Holidays (Slow)  What is a "Drug Holiday"? Drug Holiday: is the name given to the period of time during which a patient stops taking a medication(s) for the purpose of eliminating tolerance to the drug.  Benefits Improved effectiveness of opioids. Decreased opioid dose needed to achieve benefits. Improved pain with lesser dose.    What is tolerance? Tolerance: is the progressive decreased in effectiveness of a drug due to its repetitive use. With repetitive use, the body gets use to the medication and as a consequence, it loses its effectiveness. This is a common problem seen with opioid pain medications. As a result, a larger dose of the drug is needed to achieve the same effect that used to be obtained with a smaller dose.  How long should a "Drug Holiday" last? You should stay off of the pain medicine for at least 14 consecutive days. (2 weeks)  Should I stop the medicine "cold [place]"? No. You should always coordinate with your Pain Specialist so that he/she can provide you with the correct medication dose to make the transition as smoothly as possible.  How do I stop the medicine? Slowly. You will be instructed to decrease the daily amount of pills that you take by one (1) pill every seven (7) days. This is called a "slow downward taper" of your dose. For example: if you normally take four (4) pills per day, you will be asked to drop this dose to three (3) pills per day for seven (7) days, then to two (2) pills per day for seven (7) days, then to one (1) per day for seven (7) days, and at the end of those last seven (7) days, this is when the "Drug Holiday" would start.   Will I have withdrawals? By doing a "slow downward taper" like this one, it  is unlikely that you will experience any significant withdrawal symptoms. Typically, what triggers withdrawals is the sudden stop of a high dose opioid therapy. Withdrawals can usually be avoided by slowly decreasing the dose over a prolonged period of time. If you do not follow these instructions and decide to stop your medication abruptly, withdrawals may be possible.  What are withdrawals? Withdrawals: refers to the wide range of symptoms that occur after stopping or dramatically reducing opiate drugs after heavy and prolonged use. Withdrawal symptoms do not occur to patients that use low dose opioids, or those who take the medication sporadically. Contrary to benzodiazepine (example: Valium, Xanax, etc.) or alcohol withdrawals ("Delirium Tremens"), opioid withdrawals are not lethal. Withdrawals are the physical manifestation of the body getting rid of the excess receptors.  Expected Symptoms Early symptoms of withdrawal may include: Agitation Anxiety Muscle aches Increased tearing Insomnia Runny nose Sweating Yawning  Late symptoms of withdrawal may include: Abdominal cramping Diarrhea Dilated pupils Goose bumps Nausea Vomiting  Will I experience withdrawals? Due to the slow nature of the taper, it is very unlikely that you will experience any.  What is a slow taper? Taper: refers to the gradual decrease in dose.  (Last update: 08/07/2019) ____________________________________________________________________________________________   [email or web address].[email or web address]

## 2020-08-08 LAB — TOXASSURE SELECT 13 (MW), URINE

## 2020-08-10 ENCOUNTER — Encounter: Payer: BC Managed Care – PPO | Admitting: Pain Medicine

## 2020-11-03 ENCOUNTER — Telehealth: Payer: Self-pay

## 2020-11-03 NOTE — Telephone Encounter (Signed)
Patient called and per visit done.

## 2020-11-03 NOTE — Telephone Encounter (Signed)
Pt has covid and an appt tomorrow. Can you check with Dr.N and see if he is willing to do a VV I tried to bubble him and I was unsuccessful

## 2020-11-03 NOTE — Progress Notes (Signed)
Patient: Adam Petty.  Service Category: E/M  Provider: Gaspar Cola, MD  DOB: 1960-04-23  DOS: 11/04/2020  Location: Office  MRN: [id number]  Setting: Ambulatory outpatient  Referring Provider: Ulyess Blossom, PA  Type: Established Patient  Specialty: Interventional Pain Management  PCP: Ulyess Blossom, PA  Location: Remote location  Delivery: TeleHealth     Virtual Encounter - Pain Management PROVIDER NOTE: Information contained herein reflects review and annotations entered in association with encounter. Interpretation of such information and data should be left to medically-trained personnel. Information provided to patient can be located elsewhere in the medical record under "Patient Instructions". Document created using STT-dictation technology, any transcriptional errors that may result from process are unintentional.    Contact & Pharmacy Preferred: [phone] Home: [phone] (home) Mobile: [phone] (mobile) E-mail: [email or web address]  [facility] [address] [place]) [place] [address] Phone: [phone] Fax: [facility] [address], [place] - [place] [address] Phone: [phone] Fax: [phone]   Pre-screening  Mr. Matsuo offered "in-person" vs "virtual" encounter. He indicated preferring virtual for this encounter.   Reason COVID-19*  Social distancing based on CDC and AMA recommendations.   I contacted [name]. on 11/04/2020 via telephone.      I clearly identified myself as Gaspar Cola, MD. I verified that I was speaking with the correct person using two identifiers (Name: Savoy Somerville., and date of birth: Feb 16, 1960).  Consent I sought verbal advanced consent from [name]. for virtual visit interactions. I informed Mr. Quale of possible security and  privacy concerns, risks, and limitations associated with providing "not-in-person" medical evaluation and management services. I also informed Mr. Colpitts of the availability of "in-person" appointments. Finally, I informed him that there would be a charge for the virtual visit and that he could be  personally, fully or partially, financially responsible for it. Mr. Maulden expressed understanding and agreed to proceed.   Historic Elements   Mr. Bennett Ram. is a 60 y.o. year old, male patient evaluated today after our last contact on 08/05/2020. Mr. 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 He also  has a past surgical history that includes Tonsillectomy; Shoulder surgery; Palate surgery; Cervical fusion; Foot surgery; Lumbar laminectomy/decompression microdiscectomy (N/A, 02/06/2013); Knee arthroscopy (Right, 10/02/2013); Knee arthroscopy with medial menisectomy (Right, 03/25/2015); Knee bursectomy (Right, 03/25/2015); Cystoscopy w/ retrogrades (Right, 11/19/2015); Cystoscopy w/ retrogrades (Right, 10/03/2017); Ureteroscopy (Right, 10/03/2017); and CTR (Left, 02/27/2018). Mr. Erven has a current medication list which includes the following prescription(s): albuterol, cyanocobalamin, doxycycline, gabapentin, insulin degludec, ketoconazole, metformin, naproxen sodium, novolog flexpen, [START ON 11/08/2020] oxycodone-acetaminophen, [START ON 12/08/2020] oxycodone-acetaminophen, potassium citrate, ramipril, rosuvastatin, semaglutide(0.25 or 0.22m/dos), and testosterone. He  reports that he has never smoked. He has never used smokeless tobacco. He reports current alcohol use. He reports that he does not use drugs. Mr. BTamburohas No Known Allergies.    HPI  Today, he is being contacted for medication management.  The patient changed his face-to-face medication management encounter to a virtual visit due to having tested positive for COVID-19.  RTCB: 01/07/2021 Nonopioids transfer 11/04/2019: Neurontin  Pharmacotherapy Assessment   Analgesic:  Oxycodone IR 5 mg, 1 tab PO q 6 hrs (20 mg/day)  MME/day: 30 mg/day.   Monitoring: [place] PMP: PDMP reviewed during this encounter.       Pharmacotherapy: No side-effects or adverse reactions reported. Compliance: No problems identified. Effectiveness: Clinically acceptable. Plan: Refer to "POC". UDS:  Summary  Date Value Ref Range Status  08/05/2020 Note  Final    Comment:    ==================================================================== [employer] Select 13 (MW) ==================================================================== Test                             Result       Flag       Units  Drug Present and Declared for Prescription Verification   Oxycodone                      1383         EXPECTED   ng/mg creat   Oxymorphone                    1511         EXPECTED   ng/mg creat   Noroxycodone                   1033         EXPECTED   ng/mg creat   Noroxymorphone                 201          EXPECTED   ng/mg creat    Sources of oxycodone are scheduled prescription medications.    Oxymorphone, noroxycodone, and noroxymorphone are expected    metabolites of oxycodone. Oxymorphone is also available as a    scheduled prescription medication.  ==================================================================== Test                      Result    Flag   Units      Ref Range   Creatinine              253              mg/dL      >=20 ==================================================================== Declared Medications:  The flagging and interpretation on this report are based on the  following declared medications.  Unexpected results may arise from  inaccuracies in the  declared medications.   **Note: The testing scope of this panel includes these medications:   Oxycodone (Percocet)   **Note: The testing scope of this panel does not include the  following reported medications:   Acetaminophen (Percocet)  Albuterol (Ventolin HFA)  Doxycycline (Vibramycin)  Gabapentin (Neurontin)  Insulin (NovoLog)  Ketoconazole (Nizoral)  Metformin (Glucophage)  Naproxen (Aleve)  Potassium (Klor-Con)  Ramipril (Altace)  Rosuvastatin (Crestor)  Semaglutide  Testosterone  Turmeric  Vitamin B1 ==================================================================== For clinical consultation, please call [phone]. ====================================================================      Laboratory Chemistry Profile   Renal Lab Results  Component Value Date   BUN 18 07/13/2020   CREATININE 0.93 07/13/2020   GFRAA >60 07/01/2019   GFRNONAA >60 07/13/2020    Hepatic Lab Results  Component Value Date   AST 32 07/13/2020   ALT 32 07/13/2020   ALBUMIN 4.1 07/13/2020   ALKPHOS 94 07/13/2020    Electrolytes Lab Results  Component Value Date   NA 136 07/13/2020   K 4.6 07/13/2020   CL 109 07/13/2020   CALCIUM 8.8 (L)  07/13/2020   MG 1.8 03/12/2015    Bone No results found for: VD25OH, H139778, DD2202RK2, HC6237SE8, 25OHVITD1, 25OHVITD2, 25OHVITD3, TESTOFREE, TESTOSTERONE  Inflammation (CRP: Acute Phase) (ESR: Chronic Phase) Lab Results  Component Value Date   CRP 0.6 03/12/2015   ESRSEDRATE 9 03/12/2015         Note: Above Lab results reviewed.  Imaging  DG Chest 2 View CLINICAL DATA:  Cough  EXAM: CHEST - 2 VIEW  COMPARISON:  January 31, 2013  FINDINGS: Lungs are clear. Heart size and pulmonary vascularity normal. No evident adenopathy. There is degenerative change in the thoracic spine. There is postoperative change in the lower cervical region.  IMPRESSION: Lungs clear.  Cardiac silhouette within normal  limits.  Electronically Signed   By: Lowella Grip III M.D.   On: 07/13/2020 11:36  Assessment  The primary encounter diagnosis was Chronic pain syndrome. 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  Plan of Care  Problem-specific:  No problem-specific Assessment & Plan notes found for this encounter.  Mr. Murl Golladay. has a current medication list which includes the following long-term medication(s): albuterol, gabapentin, [START ON 11/08/2020] oxycodone-acetaminophen, [START ON 12/08/2020] oxycodone-acetaminophen, and testosterone.  Pharmacotherapy (Medications Ordered): Meds ordered this encounter  Medications   oxyCODONE-acetaminophen (PERCOCET) 5-325 MG tablet    Sig: Take 1 tablet by mouth every 6 (six) hours as needed for severe pain. Must last 30 days.    Dispense:  120 tablet    Refill:  0    DO NOT: delete (not duplicate); no partial-fill (will deny script to complete), no refill request (F/U required). DISPENSE: 1 day early if closed on fill date. WARN: No CNS-depressants within 8 hrs of med.   oxyCODONE-acetaminophen (PERCOCET) 5-325 MG tablet    Sig: Take 1 tablet by mouth every 6 (six) hours as needed for severe pain. Must last 30 days.    Dispense:  120 tablet    Refill:  0    DO NOT: delete (not  duplicate); no partial-fill (will deny script to complete), no refill request (F/U required). DISPENSE: 1 day early if closed on fill date. WARN: No CNS-depressants within 8 hrs of med.    Orders:  No orders of the defined types were placed in this encounter.  Follow-up plan:   Return in about 2 months (around 01/07/2021) for Eval-day (M,W), (F2F), (MM).     Interventional treatment options: Planned, scheduled, and/or pending:   Diagnostic right caudal ESI #1 + diagnostic epidurogram #1 under fluoroscopic guidance and IV sedation.  The patient's last MRI was done in 2016 at a [facility] institution.  He continues to have pain that he describes as slowly worsening.  He seems to have 2 sources for this pain.  There is the pain in his back which clearly is coming from his facet joints, but there is also pain down his right lower extremity that seems to be an S1  lumbosacral sensory radiculopathy.  Since he does have a prior history of back surgery, it is very possible that this may have something to do with epidural fibrosis.  We will be scheduling the patient to come in for a diagnostic right-sided caudal ESI + epidurogram under fluoroscopic guidance and IV sedation to determine if this is the case.  Satiate 21   Under consideration:   Diagnostic right caudal ESI #1 + diagnostic epidurogram #1  Possible right Suprascapular nerve RFA  Possible left lumbar facet RFA  Diagnostic right knee genicular nerve block .  Possible right knee genicular nerve RFA   Therapeutic/palliative (PRN):   Palliative right lumbar facet block #4  Palliative left lumbar facet block #3  Palliative right lumbar facet RFA #2  (Right side done 04/13/2016) (100% relief) Diagnostic right Suprascapular nerve Block #2     Recent Visits No visits were found meeting these conditions. Showing recent visits within past 90 days and meeting all other requirements Today's Visits Date Type Provider Dept  11/04/20 Office Visit Milinda Pointer, MD [facility]  Showing today's visits and meeting all other requirements Future Appointments No visits were found meeting these conditions. Showing future appointments within next 90 days and meeting all other requirements I discussed the assessment and treatment plan with the patient. The patient was provided an opportunity to ask questions and all were answered. The patient agreed with the plan and demonstrated an understanding of the instructions.  Patient advised to call back or seek an in-person evaluation if the symptoms or condition worsens.  Duration of encounter: 13 minutes.  Note by: Gaspar Cola, MD Date: 11/04/2020; Time: 3:20 PM

## 2020-11-04 ENCOUNTER — Other Ambulatory Visit: Payer: Self-pay

## 2020-11-04 ENCOUNTER — Ambulatory Visit: Payer: BC Managed Care – PPO | Attending: Pain Medicine | Admitting: Pain Medicine

## 2020-11-04 DIAGNOSIS — M79604 Pain in right leg: Secondary | ICD-10-CM

## 2020-11-04 DIAGNOSIS — M961 Postlaminectomy syndrome, not elsewhere classified: Secondary | ICD-10-CM

## 2020-11-04 DIAGNOSIS — M25511 Pain in right shoulder: Secondary | ICD-10-CM | POA: Diagnosis not present

## 2020-11-04 DIAGNOSIS — G894 Chronic pain syndrome: Secondary | ICD-10-CM

## 2020-11-04 DIAGNOSIS — M545 Low back pain, unspecified: Secondary | ICD-10-CM

## 2020-11-04 DIAGNOSIS — M5417 Radiculopathy, lumbosacral region: Secondary | ICD-10-CM

## 2020-11-04 DIAGNOSIS — Z79891 Long term (current) use of opiate analgesic: Secondary | ICD-10-CM

## 2020-11-04 DIAGNOSIS — G8929 Other chronic pain: Secondary | ICD-10-CM

## 2020-11-04 DIAGNOSIS — M5126 Other intervertebral disc displacement, lumbar region: Secondary | ICD-10-CM

## 2020-11-04 DIAGNOSIS — Z79899 Other long term (current) drug therapy: Secondary | ICD-10-CM

## 2020-11-04 DIAGNOSIS — M25561 Pain in right knee: Secondary | ICD-10-CM

## 2020-11-04 DIAGNOSIS — M4726 Other spondylosis with radiculopathy, lumbar region: Secondary | ICD-10-CM

## 2020-11-04 MED ORDER — OXYCODONE-ACETAMINOPHEN 5-325 MG PO TABS: 1.0000 | ORAL_TABLET | Freq: Four times a day (QID) | ORAL | 0 refills | Status: DC | PRN

## 2020-11-04 NOTE — Patient Instructions (Signed)

## 2020-11-11 ENCOUNTER — Encounter: Payer: BC Managed Care – PPO | Admitting: Pain Medicine

## 2020-11-17 ENCOUNTER — Other Ambulatory Visit: Payer: Self-pay | Admitting: Family Medicine

## 2020-11-17 ENCOUNTER — Other Ambulatory Visit: Payer: Self-pay

## 2020-11-17 ENCOUNTER — Ambulatory Visit
Admission: RE | Admit: 2020-11-17 | Discharge: 2020-11-17 | Disposition: A | Discharge status: Home / Self Care | Payer: BC Managed Care – PPO | Source: Ambulatory Visit | Attending: Family Medicine | Admitting: Family Medicine

## 2020-11-17 ENCOUNTER — Ambulatory Visit
Admission: RE | Admit: 2020-11-17 | Discharge: 2020-11-17 | Disposition: A | Discharge status: Home / Self Care | Payer: BC Managed Care – PPO | Attending: Family Medicine | Admitting: Family Medicine

## 2020-11-17 DIAGNOSIS — U099 Post covid-19 condition, unspecified: Secondary | ICD-10-CM | POA: Diagnosis not present

## 2020-12-27 ENCOUNTER — Encounter: Payer: Self-pay | Admitting: Emergency Medicine

## 2020-12-27 ENCOUNTER — Ambulatory Visit
Admission: EM | Admit: 2020-12-27 | Discharge: 2020-12-27 | Disposition: A | Discharge status: Home / Self Care | Payer: BC Managed Care – PPO

## 2020-12-27 ENCOUNTER — Emergency Department: Payer: BC Managed Care – PPO

## 2020-12-27 ENCOUNTER — Emergency Department
Admission: EM | Admit: 2020-12-27 | Discharge: 2020-12-27 | Disposition: A | Discharge status: Home / Self Care | Payer: BC Managed Care – PPO | Attending: Emergency Medicine | Admitting: Emergency Medicine

## 2020-12-27 ENCOUNTER — Other Ambulatory Visit: Payer: Self-pay

## 2020-12-27 DIAGNOSIS — I10 Essential (primary) hypertension: Secondary | ICD-10-CM | POA: Diagnosis not present

## 2020-12-27 DIAGNOSIS — E1165 Type 2 diabetes mellitus with hyperglycemia: Secondary | ICD-10-CM | POA: Diagnosis not present

## 2020-12-27 DIAGNOSIS — R519 Headache, unspecified: Secondary | ICD-10-CM | POA: Insufficient documentation

## 2020-12-27 DIAGNOSIS — H60501 Unspecified acute noninfective otitis externa, right ear: Secondary | ICD-10-CM

## 2020-12-27 DIAGNOSIS — H6091 Unspecified otitis externa, right ear: Secondary | ICD-10-CM | POA: Diagnosis not present

## 2020-12-27 DIAGNOSIS — R2990 Unspecified symptoms and signs involving the nervous system: Secondary | ICD-10-CM

## 2020-12-27 DIAGNOSIS — H9201 Otalgia, right ear: Secondary | ICD-10-CM

## 2020-12-27 DIAGNOSIS — Z7984 Long term (current) use of oral hypoglycemic drugs: Secondary | ICD-10-CM | POA: Diagnosis not present

## 2020-12-27 DIAGNOSIS — R42 Dizziness and giddiness: Secondary | ICD-10-CM | POA: Diagnosis present

## 2020-12-27 DIAGNOSIS — Z20822 Contact with and (suspected) exposure to covid-19: Secondary | ICD-10-CM | POA: Insufficient documentation

## 2020-12-27 DIAGNOSIS — Z8616 Personal history of COVID-19: Secondary | ICD-10-CM | POA: Diagnosis not present

## 2020-12-27 LAB — CBC WITH DIFFERENTIAL/PLATELET
Abs Immature Granulocytes: 0.03 10*3/uL (ref 0.00–0.07)
Basophils Absolute: 0.1 10*3/uL (ref 0.0–0.1)
Basophils Relative: 1 %
Eosinophils Absolute: 0.2 10*3/uL (ref 0.0–0.5)
Eosinophils Relative: 2 %
HCT: 39.8 % (ref 39.0–52.0)
Hemoglobin: 13.2 g/dL (ref 13.0–17.0)
Immature Granulocytes: 0 %
Lymphocytes Relative: 29 %
Lymphs Abs: 2.5 10*3/uL (ref 0.7–4.0)
MCH: 28.5 pg (ref 26.0–34.0)
MCHC: 33.2 g/dL (ref 30.0–36.0)
MCV: 86 fL (ref 80.0–100.0)
Monocytes Absolute: 0.5 10*3/uL (ref 0.1–1.0)
Monocytes Relative: 6 %
Neutro Abs: 5.4 10*3/uL (ref 1.7–7.7)
Neutrophils Relative %: 62 %
Platelets: 202 10*3/uL (ref 150–400)
RBC: 4.63 MIL/uL (ref 4.22–5.81)
RDW: 13.3 % (ref 11.5–15.5)
WBC: 8.6 10*3/uL (ref 4.0–10.5)
nRBC: 0 % (ref 0.0–0.2)

## 2020-12-27 LAB — COMPREHENSIVE METABOLIC PANEL
ALT: 24 U/L (ref 0–44)
AST: 21 U/L (ref 15–41)
Albumin: 3.8 g/dL (ref 3.5–5.0)
Alkaline Phosphatase: 82 U/L (ref 38–126)
Anion gap: 10 (ref 5–15)
BUN: 17 mg/dL (ref 6–20)
CO2: 22 mmol/L (ref 22–32)
Calcium: 9 mg/dL (ref 8.9–10.3)
Chloride: 100 mmol/L (ref 98–111)
Creatinine, Ser: 0.97 mg/dL (ref 0.61–1.24)
GFR, Estimated: >60 mL/min (ref >60)
Glucose, Bld: 223 mg/dL — ABNORMAL HIGH (ref 70–99)
Potassium: 4.2 mmol/L (ref 3.5–5.1)
Sodium: 132 mmol/L — ABNORMAL LOW (ref 135–145)
Total Bilirubin: 0.7 mg/dL (ref 0.3–1.2)
Total Protein: 7.6 g/dL (ref 6.5–8.1)

## 2020-12-27 LAB — RESP PANEL BY RT-PCR (FLU A&B, COVID) ARPGX2
Influenza A by PCR: NEGATIVE
Influenza B by PCR: NEGATIVE
SARS Coronavirus 2 by RT PCR: NEGATIVE

## 2020-12-27 MED ORDER — NEOMYCIN-POLYMYXIN-HC 3.5-10000-1 OT SUSP
3.0000 [drp] | Freq: Four times a day (QID) | OTIC | Status: DC
  Filled 2020-12-27: qty 10

## 2020-12-27 MED ORDER — NEOMYCIN-COLIST-HC-THONZONIUM 3.3-3-10-0.5 MG/ML OT SUSP: 3.0000 [drp] | Freq: Four times a day (QID) | OTIC | Status: DC

## 2020-12-27 MED ORDER — KETOROLAC TROMETHAMINE 60 MG/2ML IM SOLN: 60.0000 mg | Freq: Once | INTRAMUSCULAR | Status: DC

## 2020-12-27 MED ORDER — NEOMYCIN-POLYMYXIN-HC 3.5-10000-1 OT SUSP: 3.0000 [drp] | Freq: Four times a day (QID) | OTIC | 0 refills | Status: AC

## 2020-12-27 NOTE — ED Triage Notes (Signed)
Pt comes with c/o dizziness, weakness, headache, cough and some blurry vision. Pt states he had covid few months ago. Pt states he hasn't really gotten over that.  Pt states right sided ear pain. Pt states little pain in left.  Pt denies any current blurry vision.

## 2020-12-27 NOTE — ED Provider Notes (Signed)
  Emergency Medicine Provider Triage Evaluation Note  Adam Petty. , a 60 y.o.male,  was evaluated in triage.  Pt complains of headache and blurred vision.  Patient states that he has been experiencing headache and blurred vision for the past 2 months ever since he was diagnosed with COVID.  He visited urgent care today who stated that he had some neurological deficits and bulging behind the TMs.  He currently endorses some chills and cough recently as well.   Review of Systems  Positive: Headache, blurred vision, cough, chills Negative: Denies fever, chest pain, vomiting  Physical Exam   Vitals:   12/27/20 1526  BP: (!) 123/53  Pulse: 92  Resp: 20  Temp: 98.3 F (36.8 C)  SpO2: 97%   Gen:   Awake, appears uncomfortable Resp:  Normal effort  MSK:   Moves extremities without difficulty  Other:    Medical Decision Making  Given the patient's initial medical screening exam, the following diagnostic evaluation has been ordered. The patient will be placed in the appropriate treatment space, once one is available, to complete the evaluation and treatment. I have discussed the plan of care with the patient and I have advised the patient that an ED physician or mid-level practitioner will reevaluate their condition after the test results have been received, as the results may give them additional insight into the type of treatment they may need.    Diagnostics: Head CT, CXR, respiratory panel, labs.  Treatments: none immediately   Varney Daily, [place] 12/27/20 1537    Merwyn Katos, MD 12/27/20 1626

## 2020-12-27 NOTE — Discharge Instructions (Addendum)
You have been seen today in the emergency room with a chief complaint of headache, cough, body or pain. You were treated in the emergency room for your headache with an injection of Toradol. 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  I encourage you to discuss this with your primary care provider for potential referral to COVID rehab. You have been noted to have an external ear infection to the right ear.  This will be treated with Cortisporin eardrops.  A prescription has been sent to the pharmacy of your choosing.

## 2020-12-27 NOTE — ED Provider Notes (Signed)
[facility] Emergency Department Provider Note   ____________________________________________   Event Date/Time   First MD Initiated Contact with Patient 12/27/20 1826     (approximate)  I have reviewed the triage vital signs and the nursing notes.   HISTORY  Chief Complaint Dizziness    HPI Adam Petty. is a 60 y.o. male patient presents to the emergency room for primary complaint of headache.  Patient went to urgent care this morning and they were concerned that he may have some sort of CVA occurring.  They report that his eye tracking was abnormal and that with the headache combined concerned him enough to send him to the emergency room today.  Patient reports that in October he was diagnosed with COVID and since that time he has been unable to recover completely.  He reports that he still has a productive cough.  He also reports that he has had a headache that is worsening in intensity since having COVID.  The headache has gotten worse over the last 3 days.  Patient denies chest pain, shortness of breath, dyspnea on exertion, vision changes, syncope, altered mental status. Patient's respirations are even and unlabored at this time.  His lungs are clear bilaterally in all lung fields.  His heart rate is 92 and regular rate and rhythm.  He appears in no acute distress at this time.  However he does appear to be in pain from headache. Patient reports that he has intermittent dizziness but feels this is coming from the new onset of right ear pain.  Patient reports that his right ear is hurting as a 7 out of 10 and throbbing in nature.  He reports that his head is a 10 out of 10 pain and throbbing/sharp in nature primarily on the right side of his head.  Patient reports that the headache has been constant for the past 24 hours. Also reports that he is having right ear pain.  The pain in his ear is noted as a 5 out of 10.  And throbbing in nature. Patient  walked back to room with steady gait and unassisted. Patient's visual tracking is normal with slight nystagmus noted to the right.  Patient denies any vision changes. Patient has equal handgrips.  Patient is alert and oriented and at his baseline.  Past Medical History:  Diagnosis Date   Arthritis, senescent 10/22/2014   Back pain    history spinal stenosis, DDD   Chronic pain syndrome 10/22/2014   Diabetes mellitus without complication (HCC)    History of kidney stones    IBS (irritable bowel syndrome)    Lateral meniscus tear    rt knee   Preventative health care    takes crestor/ altace for "preventative reasons" denies heart or BP problems   Sleep apnea    had UPPP surgery and now sleep apnea resolved   Spinal stenosis     Patient Active Problem List   Diagnosis Date Noted   Inflammatory spondylopathy of lumbosacral region (HCC) 08/05/2020   Chronic use of opiate for therapeutic purpose 05/11/2020   Long term current use of non-steroidal anti-inflammatories (NSAID) 07/01/2019   Pharmacologic therapy 06/24/2019   Lumbosacral radiculopathy (S1) (Right) 04/17/2019   Spondylosis without myelopathy or radiculopathy, lumbosacral region 04/02/2019   DDD (degenerative disc disease), lumbosacral 04/02/2019   DDD (degenerative disc disease), thoracic 04/02/2019   Hepatitis C antibody test positive 11/20/2018   Hypertension associated with diabetes (HCC) 04/18/2018   B12 deficiency 09/08/2017  Chronic sacroiliac joint pain (Bilateral) (R>L) 08/25/2016   Osteoarthritis of glenohumeral joint (Right) 06/16/2016   Acute low back pain without sciatica 05/05/2016   Chronic pain syndrome (WC injury) 02/09/2016   Acute medial meniscal tear 03/25/2015   Chronic knee pain (2ry area of Pain) (WC injury) (Right) 03/12/2015   Osteoarthritis of knee (Right) 03/12/2015   Chronic shoulder pain (3ry area of Pain) (Right) 03/12/2015   Osteoarthritis of shoulder (Right) 03/12/2015   Chronic lower  extremity pain (Right) 03/12/2015   Recurrent nephrolithiasis 03/12/2015   Lumbar spondylosis 02/24/2015   Long term prescription opiate use 12/22/2014   Encounter for chronic pain management 12/22/2014   Neurogenic pain 12/22/2014   Neuropathic pain 12/22/2014   Musculoskeletal pain 12/22/2014   Chronic low back pain (WC injury) (1ry area of Pain) (Bilateral) (R>L) w/o sciatica 10/22/2014   Long term current use of opiate analgesic 10/22/2014   Encounter for therapeutic drug level monitoring 10/22/2014   Opiate use (30 MME/Day) 10/22/2014   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   Discogenic syndrome, lumbar (WC injury) 10/22/2014   Osteoarthritis of spine with radiculopathy, lumbar region (WC injury) 10/22/2014   Lumbar facet syndrome (WC injury) (Bilateral) (R>L) 10/22/2014   Lumbosacral radiculopathy (WC injury) 10/22/2014   Uncomplicated opioid dependence (HCC) 10/22/2014   Therapeutic opioid-induced constipation (OIC) 10/22/2014   Lateral meniscal tear 10/01/2013   Microalbuminuria 09/12/2013   Type 2 diabetes mellitus with hyperglycemia, with long-term current use of insulin (HCC) 09/12/2013   Obesity, Class III, BMI 40-49.9 (morbid obesity) (HCC) 09/10/2013   Dyslipidemia 09/10/2013   Adiposity 09/10/2013   Hyperlipidemia due to type 2 diabetes mellitus (HCC) 09/10/2013   Spinal stenosis of lumbar region 02/06/2013   Lumbar canal stenosis 02/06/2013    Past Surgical History:  Procedure Laterality Date   CERVICAL FUSION     CTR Left 02/27/2018   CTR and trigger finger repair   CYSTOSCOPY W/ RETROGRADES Right 11/19/2015   Procedure: URETEROSCOPY WITH RETROGRADE PYELOGRAM;  Surgeon: Orson Ape, MD;  Location: [facility];  Service: Urology;  Laterality: Right;   CYSTOSCOPY W/ RETROGRADES Right 10/03/2017   Procedure: CYSTOSCOPY WITH RETROGRADE PYELOGRAM;  Surgeon: Orson Ape, MD;  Location: [facility];  Service: Urology;  Laterality: Right;   FOOT  SURGERY     RT   KNEE ARTHROSCOPY Right 10/02/2013   Procedure: RIGHT KNEE ARTHROSCOPY, PARTIAL LATERAL MENISCECTOMY, DEBRIDEMENT, MEDIAL AND LATERAL CHONDROPLASTY;  Surgeon: Loanne Drilling, MD;  Location: [facility];  Service: Orthopedics;  Laterality: Right;   KNEE ARTHROSCOPY WITH MEDIAL MENISECTOMY Right 03/25/2015   Procedure: RIGHT KNEE ARTHROSCOPY WITH MENISCAL DEBRIDEMENT and chrodroplasty;  Surgeon: Ollen Gross, MD;  Location: [facility];  Service: Orthopedics;  Laterality: Right;   KNEE BURSECTOMY Right 03/25/2015   Procedure: RIGHT KNEE PREPATELLA BURSECTOMY;  Surgeon: Ollen Gross, MD;  Location: [facility];  Service: Orthopedics;  Laterality: Right;   LUMBAR LAMINECTOMY/DECOMPRESSION MICRODISCECTOMY N/A 02/06/2013   Procedure: MICRO LUMBAR DECOMPRESSION L3-L4 and L4 - L5 ;  Surgeon: Javier Docker, MD;  Location: [facility];  Service: Orthopedics;  Laterality: N/A;   PALATE SURGERY     surg for sleep apnea and sinus issues   SHOULDER SURGERY     x5 on RT / x1 LFT   TONSILLECTOMY     URETEROSCOPY Right 10/03/2017   Procedure: URETEROSCOPY;  Surgeon: Orson Ape, MD;  Location: [facility];  Service: Urology;  Laterality: Right;    Prior to Admission medications  Medication Sig Start Date End Date Taking? Authorizing Provider  neomycin-polymyxin-hydrocortisone (CORTISPORIN) 3.5-10000-1 OTIC suspension Place 3 drops into the right ear 4 (four) times daily for 7 days. 12/27/20 01/03/21 Yes Herschell Dimes, NP  albuterol (VENTOLIN HFA) 108 (90 Base) MCG/ACT inhaler Inhale 1-2 puffs into the lungs every 6 (six) hours as needed for wheezing or shortness of breath. 07/13/20   Everlene Other G, DO  cyanocobalamin (,VITAMIN B-12,) 1000 MCG/ML injection Inject into the muscle every 30 (thirty) days.  03/19/15   [provider]  doxycycline (VIBRAMYCIN) 100 MG capsule Take 1 capsule (100 mg total) by mouth 2 (two) times daily. 07/13/20   Tommie Sams, DO  gabapentin (NEURONTIN) 600 MG tablet Take 1  tablet (600 mg total) by mouth every 6 (six) hours. 11/18/19 11/03/20  Delano Metz, MD  insulin degludec (TRESIBA) 100 UNIT/ML FlexTouch Pen Inject 40 Units into the skin daily. Has been taking 30 01/14/16   [provider]  ketoconazole (NIZORAL) 2 % shampoo APPLY TO AFFECTED AREA EVERY DAY AS NEEDED 08/10/16   [provider]  metFORMIN (GLUCOPHAGE) 1000 MG tablet TAKE 1 TABLET (1,000 MG TOTAL) BY MOUTH 2 (TWO) TIMES DAILY WITH MEALS. 12/04/15   [provider]  naproxen sodium (ALEVE) 220 MG tablet Take 440 mg by mouth 2 (two) times daily as needed.     [provider]  NOVOLOG FLEXPEN 100 UNIT/ML FlexPen Inject 0-10 Units into the skin daily as needed for high blood sugar (sliding scale). Reported on 02/24/2015 02/23/15   [provider]  oxyCODONE-acetaminophen (PERCOCET) 5-325 MG tablet Take 1 tablet by mouth every 6 (six) hours as needed for severe pain. Must last 30 days. 11/08/20 12/27/20  Delano Metz, MD  oxyCODONE-acetaminophen (PERCOCET) 5-325 MG tablet Take 1 tablet by mouth every 6 (six) hours as needed for severe pain. Must last 30 days. 12/08/20 01/07/21  Delano Metz, MD  potassium citrate (UROCIT-K) 10 MEQ (1080 MG) SR tablet Take 10 mEq by mouth 2 (two) times daily.    [provider]  ramipril (ALTACE) 5 MG capsule Take 5 mg by mouth daily.    [provider]  rosuvastatin (CRESTOR) 5 MG tablet Take 5 mg by mouth 3 (three) times a week. Monday Wednesday Friday    [provider]  Semaglutide,0.25 or 0.5MG /DOS, 2 MG/1.5ML SOPN Inject 2 MBq into the skin every 30 (thirty) days. 12/18/17   [provider]  Testosterone 20.25 MG/ACT (1.62%) GEL Apply 2 Pump topically daily. 02/21/18   [provider]    Allergies Patient has no known allergies.  Family History  Problem Relation Age of Onset   Cancer Mother    Heart disease Mother    COPD Mother    Diabetes Father    Heart  disease Father     Social History Social History   Tobacco Use   Smoking status: Never   Smokeless tobacco: Never  Vaping Use   Vaping Use: Never used  Substance Use Topics   Alcohol use: Yes    Comment: occasional   Drug use: No    Review of Systems  Constitutional: No fever/chills Eyes: No visual changes. ENT: No sore throat.  Positive for right ear pain Cardiovascular: Denies chest pain. Respiratory: Denies shortness of breath.  Positive for cough Gastrointestinal: No abdominal pain.  No nausea, no vomiting.  No diarrhea.  No constipation. Genitourinary: Negative for dysuria. Musculoskeletal: Negative for back pain. Skin: Negative for rash. Neurological: Positive for headaches.  Negative for focal weakness or numbness.   ____________________________________________   PHYSICAL EXAM:  VITAL SIGNS: ED Triage Vitals  Enc Vitals Group     BP 12/27/20 1526 (!) 123/53     Pulse Rate 12/27/20 1526 92     Resp 12/27/20 1526 20     Temp 12/27/20 1526 98.3 F (36.8 C)     Temp Source 12/27/20 1526 Oral     SpO2 12/27/20 1526 97 %     Weight 12/27/20 1526 250 lb (113.4 kg)     Height 12/27/20 1526  (1.676 m)     Head Circumference --      Peak Flow --      Pain Score 12/27/20 1536 5     Pain Loc --      Pain Edu? --      Excl. in GC? --     Constitutional: Alert and oriented. Well appearing and in no acute distress. Eyes: Conjunctivae are normal. PERRL. EOMI. patient has slight nystagmus noted to the right. Head: Atraumatic.  Patient has noted erythema and swelling to the right ear canal.  Patient complains of right ear pain. Nose: No congestion/rhinnorhea. Mouth/Throat: Mucous membranes are moist.  Oropharynx non-erythematous. Neck: No stridor. Hematological/Lymphatic/Immunilogical: No cervical lymphadenopathy. Cardiovascular: Normal rate, regular rhythm. Grossly normal heart sounds.  Good peripheral circulation. Respiratory: Normal respiratory effort.  No  retractions. Lungs CTAB.  Patient is positive for congested cough. Gastrointestinal: Soft and nontender. No distention. No abdominal bruits. No CVA tenderness. Musculoskeletal: No lower extremity tenderness nor edema.  No joint effusions. Neurologic:  Normal speech and language. No gross focal neurologic deficits are appreciated. No gait instability.  Patient is positive for headache there is worse on the right side of head. Skin:  Skin is warm, dry and intact. No rash noted. Psychiatric: Mood and affect are normal. Speech and behavior are normal.  ____________________________________________   LABS (all labs ordered are listed, but only abnormal results are displayed)  Labs Reviewed  COMPREHENSIVE METABOLIC PANEL - Abnormal; Notable for the following components:      Result Value   Sodium 132 (*)    Glucose, Bld 223 (*)    All other components within normal limits  RESP PANEL BY RT-PCR (FLU A&B, COVID) ARPGX2  CBC WITH DIFFERENTIAL/PLATELET   ____________________________________________  EKG   ____________________________________________  RADIOLOGY  ED MD interpretation: Patient had chest x-ray that I have reviewed it was also read by radiologist.  Chest x-ray does show hypoinflation without any cardiopulmonary disease.  Patient also had CT of the head which showed no acute findings.  Official radiology report(s): DG Chest 2 View  Result Date: 12/27/2020 CLINICAL DATA:  Headache and cough.  COVID-19 in October. EXAM: CHEST - 2 VIEW COMPARISON:  11/17/2020 FINDINGS: Lungs are hypoinflated without focal airspace consolidation or effusion. Mild stable cardiomegaly. Remainder of the exam is unchanged. IMPRESSION: Hypoinflation without acute cardiopulmonary disease. Electronically Signed   By: Elberta Fortis M.D.   On: 12/27/2020 15:56   CT Head Wo Contrast  Result Date: 12/27/2020 CLINICAL DATA:  Headache, new or worsening (Age >= 50y). Blurred vision EXAM: CT HEAD WITHOUT  CONTRAST TECHNIQUE: Contiguous axial images were obtained from the base of the skull through the vertex without intravenous contrast. COMPARISON:  02/02/2011 FINDINGS: Brain: No acute intracranial abnormality. Specifically, no hemorrhage, hydrocephalus, mass lesion, acute infarction, or significant intracranial injury. Vascular: No hyperdense vessel or unexpected calcification. Skull: No acute calvarial abnormality. Sinuses/Orbits: No acute findings Other: None IMPRESSION:  Normal study. Electronically Signed   By: Charlett Nose M.D.   On: 12/27/2020 15:59    ____________________________________________   PROCEDURES  Procedure(s) performed: None  Procedures  Critical Care performed: No  ____________________________________________   INITIAL IMPRESSION / ASSESSMENT AND PLAN / ED COURSE    60 year old man presents to the emergency room for chief complaint of headache.  The headache has been present since he was diagnosed with COVID in October.  He also has a congested cough that has been present as well.  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 Please see HPI for full explanation.  Patient did have chest x-ray that revealed hypoinflation of lungs.  With no cardiopulmonary issues.  Respirations are even and unlabored at this time.  Lungs are clear to auscultation bilaterally in all lung fields.  And patient appears in no acute distress at this time. Patient also had CT of the head which shows no acute findings. Patient's labs were reviewed and are found to be reassuring with no treatment indicated at this time.  His sodium is noted to be slightly low and I have encouraged him to drink Gatorade for the next several days and stay hydrated.  Patient also had respiratory panel that has resulted COVID-negative and flu negative.  Patient also complains of right ear pain.  On exam, the right ear canal is swollen and erythematous.  Patient will be treated for  external otitis media with Cortisporin drops to use 3 to 4 drops 4 times a day for the next 5 to 7 days. I will also treat patient's headache with IM Toradol here in the emergency room.  Is offered reassurance that his labs are to be considered normal at this time with the exception of his sodium being slightly low.  He is encouraged to drink Gatorade and stay well-hydrated over the next several days to correct this.  His chest x-ray is to be considered normal at this time, and his CT of the head is negative. I have encouraged him to follow-up with his primary care provider within the next week to discuss long COVID and possible rehabilitation for this. I have also encouraged him to follow-up with an ophthalmologist to further evaluate the nystagmus.  Patient will be given first dose of Cortisporin here in the emergency department as pharmacies are closed this time at night, Sunday. I will then call in his prescription to the pharmacy of his choice. Patient has been waiting in the room for over an hour and Cortisporin has not been delivered from the pharmacy he is requesting to go home and pick up prescription at his local pharmacy tomorrow.  Prescription for Cortisporin has been sent to his pharmacy. Patient also reports that he never received his Toradol injection for his headache and that he can tolerate the pain no longer he would like to go home and take his own pain medication as he is covered through pain management.  Patient is to be discharged home at this time in stable condition with his wife.        ____________________________________________   FINAL CLINICAL IMPRESSION(S) / ED DIAGNOSES  Final diagnoses:  Nonintractable headache, unspecified chronicity pattern, unspecified headache type  Acute otitis externa of right ear, unspecified type     ED Discharge Orders          Ordered    neomycin-polymyxin-hydrocortisone (CORTISPORIN) 3.5-10000-1 OTIC suspension  4 times  daily  12/27/20 1949             Note:  This document was prepared using Dragon voice recognition software and may include unintentional dictation errors.     Herschell Dimes, NP 12/27/20 2052    Chesley Noon, MD 12/28/20 [phone]

## 2020-12-27 NOTE — ED Notes (Signed)
[facility]

## 2020-12-27 NOTE — ED Triage Notes (Signed)
Patient c/o right ear pain and headache that started on Friday.  Patient was diagnosed with COVID 2 months ago.  Patient states that he still has had a cough since then. Patient denies fevers.

## 2020-12-27 NOTE — ED Notes (Signed)
Patient is being discharged from the Urgent Care and sent to the [facility] Emergency Department via private vehicle . Per Clent Jacks, PA, patient is in need of higher level of care due to abnormal neurgological exam. Patient is aware and verbalizes understanding of plan of care.  Vitals:   12/27/20 1154  BP: 134/76  Pulse: 90  Resp: 16  Temp: 98.3 F (36.8 C)  SpO2: 97%

## 2020-12-27 NOTE — ED Provider Notes (Signed)
[facility]    CSN: [id number] Arrival date & time: 12/27/20  1111      History   Chief Complaint Chief Complaint  Patient presents with   Otalgia    right   Headache    HPI Adam Petty. is a 60 y.o. male.   HPI  Otalgia: Pt reports that for the past 3 days he has had right ear pain, headache. He reports that this seems like a continuation or worsening of his symptoms that started in October when he was diagnosed with COVID-19.  He states that he is having a significant headache especially on the right side which is not relieved by any over-the-counter pain medication.  He also has had some hearing changes, visual changes including double vision, confusion at times, dizziness.  No head injury.  He denies any recent fever, vomiting or shortness of breath.  Past Medical History:  Diagnosis Date   Arthritis, senescent 10/22/2014   Back pain    history spinal stenosis, DDD   Chronic pain syndrome 10/22/2014   Diabetes mellitus without complication (HCC)    History of kidney stones    IBS (irritable bowel syndrome)    Lateral meniscus tear    rt knee   Preventative health care    takes crestor/ altace for "preventative reasons" denies heart or BP problems   Sleep apnea    had UPPP surgery and now sleep apnea resolved   Spinal stenosis     Patient Active Problem List   Diagnosis Date Noted   Inflammatory spondylopathy of lumbosacral region (HCC) 08/05/2020   Chronic use of opiate for therapeutic purpose 05/11/2020   Long term current use of non-steroidal anti-inflammatories (NSAID) 07/01/2019   Pharmacologic therapy 06/24/2019   Lumbosacral radiculopathy (S1) (Right) 04/17/2019   Spondylosis without myelopathy or radiculopathy, lumbosacral region 04/02/2019   DDD (degenerative disc disease), lumbosacral 04/02/2019   DDD (degenerative disc disease), thoracic 04/02/2019   Hepatitis C antibody test positive 11/20/2018   Hypertension associated with  diabetes (HCC) 04/18/2018   B12 deficiency 09/08/2017   Chronic sacroiliac joint pain (Bilateral) (R>L) 08/25/2016   Osteoarthritis of glenohumeral joint (Right) 06/16/2016   Acute low back pain without sciatica 05/05/2016   Chronic pain syndrome (WC injury) 02/09/2016   Acute medial meniscal tear 03/25/2015   Chronic knee pain (2ry area of Pain) (WC injury) (Right) 03/12/2015   Osteoarthritis of knee (Right) 03/12/2015   Chronic shoulder pain (3ry area of Pain) (Right) 03/12/2015   Osteoarthritis of shoulder (Right) 03/12/2015   Chronic lower extremity pain (Right) 03/12/2015   Recurrent nephrolithiasis 03/12/2015   Lumbar spondylosis 02/24/2015   Long term prescription opiate use 12/22/2014   Encounter for chronic pain management 12/22/2014   Neurogenic pain 12/22/2014   Neuropathic pain 12/22/2014   Musculoskeletal pain 12/22/2014   Chronic low back pain (WC injury) (1ry area of Pain) (Bilateral) (R>L) w/o sciatica 10/22/2014   Long term current use of opiate analgesic 10/22/2014   Encounter for therapeutic drug level monitoring 10/22/2014   Opiate use (30 MME/Day) 10/22/2014   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   Discogenic syndrome, lumbar (WC injury) 10/22/2014   Osteoarthritis of spine with radiculopathy, lumbar region (WC injury) 10/22/2014   Lumbar facet syndrome (WC injury) (Bilateral) (R>L) 10/22/2014   Lumbosacral radiculopathy (WC injury) 10/22/2014   Uncomplicated opioid dependence (HCC) 10/22/2014   Therapeutic opioid-induced constipation (OIC) 10/22/2014   Lateral meniscal tear 10/01/2013   Microalbuminuria 09/12/2013  Type 2 diabetes mellitus with hyperglycemia, with long-term current use of insulin ([place]) 09/12/2013   Obesity, Class III, BMI 40-49.9 (morbid obesity) ([place]) 09/10/2013   Dyslipidemia 09/10/2013   Adiposity 09/10/2013   Hyperlipidemia due to type 2 diabetes mellitus ([place]) 09/10/2013   Spinal stenosis of lumbar region  02/06/2013   Lumbar canal stenosis 02/06/2013    Past Surgical History:  Procedure Laterality Date   CERVICAL FUSION     CTR Left 02/27/2018   CTR and trigger finger repair   CYSTOSCOPY W/ RETROGRADES Right 11/19/2015   Procedure: URETEROSCOPY WITH RETROGRADE PYELOGRAM;  Surgeon: Royston Cowper, MD;  Location: [facility];  Service: Urology;  Laterality: Right;   CYSTOSCOPY W/ RETROGRADES Right 10/03/2017   Procedure: CYSTOSCOPY WITH RETROGRADE PYELOGRAM;  Surgeon: Royston Cowper, MD;  Location: [facility];  Service: Urology;  Laterality: Right;   FOOT SURGERY     RT   KNEE ARTHROSCOPY Right 10/02/2013   Procedure: RIGHT KNEE ARTHROSCOPY, PARTIAL LATERAL MENISCECTOMY, DEBRIDEMENT, MEDIAL AND LATERAL CHONDROPLASTY;  Surgeon: Gearlean Alf, MD;  Location: [facility];  Service: Orthopedics;  Laterality: Right;   KNEE ARTHROSCOPY WITH MEDIAL MENISECTOMY Right 03/25/2015   Procedure: RIGHT KNEE ARTHROSCOPY WITH MENISCAL DEBRIDEMENT and chrodroplasty;  Surgeon: Gaynelle Arabian, MD;  Location: [facility];  Service: Orthopedics;  Laterality: Right;   KNEE BURSECTOMY Right 03/25/2015   Procedure: RIGHT KNEE PREPATELLA BURSECTOMY;  Surgeon: Gaynelle Arabian, MD;  Location: [facility];  Service: Orthopedics;  Laterality: Right;   LUMBAR LAMINECTOMY/DECOMPRESSION MICRODISCECTOMY N/A 02/06/2013   Procedure: MICRO LUMBAR DECOMPRESSION L3-L4 and L4 - L5 ;  Surgeon: Johnn Hai, MD;  Location: [facility];  Service: Orthopedics;  Laterality: N/A;   PALATE SURGERY     surg for sleep apnea and sinus issues   SHOULDER SURGERY     x5 on RT / x1 LFT   TONSILLECTOMY     URETEROSCOPY Right 10/03/2017   Procedure: URETEROSCOPY;  Surgeon: Royston Cowper, MD;  Location: [facility];  Service: Urology;  Laterality: Right;       Home Medications    Prior to Admission medications   Medication Sig Start Date End Date Taking? Authorizing Provider  cyanocobalamin (,VITAMIN B-12,) 1000 MCG/ML injection Inject into the muscle every 30  (thirty) days.  03/19/15  Yes [provider]  insulin degludec (TRESIBA) 100 UNIT/ML FlexTouch Pen Inject 40 Units into the skin daily. Has been taking 30 01/14/16  Yes [provider]  metFORMIN (GLUCOPHAGE) 1000 MG tablet TAKE 1 TABLET (1,000 MG TOTAL) BY MOUTH 2 (TWO) TIMES DAILY WITH MEALS. 12/04/15  Yes [provider]  NOVOLOG FLEXPEN 100 UNIT/ML FlexPen Inject 0-10 Units into the skin daily as needed for high blood sugar (sliding scale). Reported on 02/24/2015 02/23/15  Yes [provider]  oxyCODONE-acetaminophen (PERCOCET) 5-325 MG tablet Take 1 tablet by mouth every 6 (six) hours as needed for severe pain. Must last 30 days. 11/08/20 12/27/20 Yes Milinda Pointer, MD  ramipril (ALTACE) 5 MG capsule Take 5 mg by mouth daily.   Yes [provider]  rosuvastatin (CRESTOR) 5 MG tablet Take 5 mg by mouth 3 (three) times a week. Monday Wednesday Friday   Yes [provider]  Semaglutide,0.25 or 0.5MG /DOS, 2 MG/1.5ML SOPN Inject 2 MBq into the skin every 30 (thirty) days. 12/18/17  Yes [provider]  albuterol (VENTOLIN HFA) 108 (90 Base) MCG/ACT inhaler Inhale 1-2 puffs into the lungs every 6 (six) hours as needed for wheezing or shortness  of breath. 07/13/20   Coral Spikes, DO  doxycycline (VIBRAMYCIN) 100 MG capsule Take 1 capsule (100 mg total) by mouth 2 (two) times daily. 07/13/20   Coral Spikes, DO  gabapentin (NEURONTIN) 600 MG tablet Take 1 tablet (600 mg total) by mouth every 6 (six) hours. 11/18/19 11/03/20  Milinda Pointer, MD  ketoconazole (NIZORAL) 2 % shampoo APPLY TO AFFECTED AREA EVERY DAY AS NEEDED 08/10/16   [provider]  naproxen sodium (ALEVE) 220 MG tablet Take 440 mg by mouth 2 (two) times daily as needed.     [provider]  oxyCODONE-acetaminophen (PERCOCET) 5-325 MG tablet Take 1 tablet by mouth every 6 (six) hours as needed for severe pain. Must last 30 days. 12/08/20 01/07/21  Milinda Pointer, MD  potassium citrate (UROCIT-K) 10 MEQ (1080 MG) SR tablet Take 10 mEq by mouth 2 (two) times daily.    [provider]  Testosterone 20.25 MG/ACT (1.62%) GEL Apply 2 Pump topically daily. 02/21/18   [provider]    Family History Family History  Problem Relation Age of Onset   Cancer Mother    Heart disease Mother    COPD Mother    Diabetes Father    Heart disease Father     Social History Social History   Tobacco Use   Smoking status: Never   Smokeless tobacco: Never  Vaping Use   Vaping Use: Never used  Substance Use Topics   Alcohol use: Yes    Comment: occasional   Drug use: No     Allergies   Patient has no known allergies.   Review of Systems Review of Systems  As stated above in HPI Physical Exam Triage Vital Signs ED Triage Vitals  Enc Vitals Group     BP 12/27/20 1154 134/76     Pulse Rate 12/27/20 1154 90     Resp 12/27/20 1154 16     Temp 12/27/20 1154 98.3 F (36.8 C)     Temp Source 12/27/20 1154 Oral     SpO2 12/27/20 1154 97 %     Weight 12/27/20 1150 250 lb (113.4 kg)     Height 12/27/20 1150 5\' 6"  (1.676 m)     Head Circumference --      Peak Flow --      Pain Score 12/27/20 1150 7     Pain Loc --      Pain Edu? --      Excl. in [place]? --    No data found.  Updated Vital Signs BP 134/76 (BP Location: Left Arm)   Pulse 90   Temp 98.3 F (36.8 C) (Oral)   Resp 16   Ht 5\' 6"  (1.676 m)   Wt 250 lb (113.4 kg)   SpO2 97%   BMI 40.35 kg/m   Physical Exam Vitals and nursing note reviewed.  Constitutional:      General: He is not in acute distress.    Appearance: He is well-developed. He is not ill-appearing, toxic-appearing or diaphoretic.  HENT:     Head: Atraumatic.     Comments: There appears to be a scant protrusion of the right eye, there appears to be a bony protrusion of the right posterior scalp which appears chronic in nature.    Ears:     Comments: Bilateral TMs have central clearance  without any erythema however the rest of bilateral TMs are very full and erythematic    Mouth/Throat:     Mouth: Mucous  membranes are moist.     Pharynx: Oropharynx is clear.  Eyes:     General: No scleral icterus.    Extraocular Movements:     Right eye: Abnormal extraocular motion present.     Left eye: Abnormal extraocular motion present.     Pupils: Pupils are equal, round, and reactive to light. Pupils are equal.     Right eye: Pupil is round and reactive.     Left eye: Pupil is round and reactive.     Comments: EOMs are mildly not aligned  Cardiovascular:     Rate and Rhythm: Normal rate and regular rhythm.     Heart sounds: Normal heart sounds.  Pulmonary:     Effort: Pulmonary effort is normal.     Breath sounds: Normal breath sounds.  Musculoskeletal:        General: Normal range of motion.     Cervical back: Normal range of motion and neck supple.  Skin:    General: Skin is warm.  Neurological:     Mental Status: He is alert and oriented to person, place, and time.     Cranial Nerves: No cranial nerve deficit or facial asymmetry.     Motor: No weakness.     Coordination: Romberg sign positive (slightly unsteady Romberg). Coordination normal.     Gait: Gait normal.     Deep Tendon Reflexes: Reflexes normal.  Psychiatric:        Mood and Affect: Mood normal.        Speech: Speech normal.        Behavior: Behavior normal.     UC Treatments / Results  Labs (all labs ordered are listed, but only abnormal results are displayed) Labs Reviewed - No data to display  EKG   Radiology No results found.  Procedures Procedures (including critical care time)  Medications Ordered in UC Medications - No data to display  Initial Impression / Assessment and Plan / UC Course  I have reviewed the triage vital signs and the nursing notes.  Pertinent labs & imaging results that were available during my care of the patient were reviewed by me and considered in my medical  decision making (see chart for details).     New.  Severe pain with abnormal neuro exam.  He will need head CT which I discussed with patient and his wife to rule out intercranial abnormality.  Patient's wife to drive him to the hospital as their preference. Final Clinical Impressions(s) / UC Diagnoses   Final diagnoses:  None   Discharge Instructions   None    ED Prescriptions   None    PDMP not reviewed this encounter.   Rushie Chestnut, [place] 12/27/20 1400

## 2021-01-01 NOTE — Progress Notes (Addendum)
PROVIDER NOTE: Information contained herein reflects review and annotations entered in association with encounter. Interpretation of such information and data should be left to medically-trained personnel. Information provided to patient can be located elsewhere in the medical record under "Patient Instructions". Document created using STT-dictation technology, any transcriptional errors that may result from process are unintentional.    Patient: Adam Petty.  Service Category: E/M  Provider: Gaspar Cola, MD  DOB: 1960-08-20  DOS: 01/04/2021  Specialty: Interventional Pain Management  MRN: [id number]  Setting: Ambulatory outpatient  PCP: System, Provider Not In  Type: Established Patient    Referring Provider: Ulyess Blossom, PA  Location: Office  Delivery: Face-to-face     HPI  [name], a 60 y.o. year old male, is here today because of his Chronic pain syndrome [G89.4]. Mr. Kaus primary complain today is Back Pain Last encounter: My last encounter with him was on 11/04/2020. Pertinent problems: Mr. 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); Discogenic syndrome, lumbar (WC injury); Osteoarthritis of spine with radiculopathy, lumbar region (WC injury); Lumbar facet syndrome (WC injury) (Bilateral) (R>L); Lumbosacral radiculopathy (WC injury); Lumbar canal stenosis; Neurogenic pain; Neuropathic pain; Musculoskeletal pain; Lumbar spondylosis; Chronic knee pain (2ry area of Pain) (WC injury) (Right); Osteoarthritis of knee (Right); Chronic shoulder pain (3ry area of Pain) (Right); Osteoarthritis of shoulder (Right); Chronic lower extremity pain (Right); Recurrent nephrolithiasis; Acute medial meniscal tear; Chronic pain syndrome (WC injury); Acute low back pain without sciatica; Osteoarthritis of  glenohumeral joint (Right); Chronic sacroiliac joint pain (Bilateral) (R>L); Spondylosis without myelopathy or radiculopathy, lumbosacral region; DDD (degenerative disc disease), lumbosacral; DDD (degenerative disc disease), thoracic; Lumbosacral radiculopathy (S1) (Right); and Inflammatory spondylopathy of lumbosacral region [facility]) on their pertinent problem list. Pain Assessment: Severity of Chronic pain is reported as a 4 /10. Location: Back Right/pain radiaties down his right side to his knee. Onset: More than a month ago. Quality: Aching, Numbness, Discomfort, Constant. Timing: Constant. Modifying factor(s): Meds, heating pad and lating down. Vitals:  height is 5' 6" (1.676 m) and weight is 240 lb (108.9 kg). His temperature is 97.1 F (36.2 C) (abnormal). His blood pressure is 139/86 and his pulse is 98. His respiration is 16 and oxygen saturation is 97%.   Reason for encounter: medication management.   The patient indicates doing well with the current medication regimen. No adverse reactions or side effects reported to the medications.  The patient today was reminded of the effects of being overweight on his back.  He refers being aware of this and he indicates having lost 10 pounds since his last visit.  The patient was encouraged to continue this trend.  RTCB: 04/07/2021 Nonopioids transfer 11/04/2019: Neurontin  Pharmacotherapy Assessment  Analgesic: Oxycodone IR 5 mg, 1 tab PO q 6 hrs (20 mg/day)  MME/day: 30 mg/day.   Monitoring: [place] PMP: PDMP reviewed during this encounter.       Pharmacotherapy: No side-effects or adverse reactions reported. Compliance: No problems identified. Effectiveness: Clinically acceptable.  [name], RN  01/04/2021  8:51 AM  Signed Nursing Pain Medication Assessment:  Safety precautions to be maintained throughout the outpatient stay will include: orient to surroundings, keep bed in low position, maintain call bell within reach at all times, provide  assistance with transfer out of bed and ambulation.  Medication Inspection Compliance: Pill  count conducted under aseptic conditions, in front of the patient. Neither the pills nor the bottle was removed from the patient's sight at any time. Once count was completed pills were immediately returned to the patient in their original bottle.  Medication: Oxycodone/APAP Pill/Patch Count:  22 of 120 pills remain Pill/Patch Appearance: Markings consistent with prescribed medication Bottle Appearance: Standard pharmacy container. Clearly labeled. Filled Date: 12 / 23 / 2022 Last Medication intake:  TodaySafety precautions to be maintained throughout the outpatient stay will include: orient to surroundings, keep bed in low position, maintain call bell within reach at all times, provide assistance with transfer out of bed and ambulation.     UDS:  Summary  Date Value Ref Range Status  08/05/2020 Note  Final    Comment:    ==================================================================== [employer] [facility] 13 (MW) ==================================================================== Test                             Result       Flag       Units  Drug Present and Declared for Prescription Verification   Oxycodone                      1383         EXPECTED   ng/mg creat   Oxymorphone                    1511         EXPECTED   ng/mg creat   Noroxycodone                   1033         EXPECTED   ng/mg creat   Noroxymorphone                 201          EXPECTED   ng/mg creat    Sources of oxycodone are scheduled prescription medications.    Oxymorphone, noroxycodone, and noroxymorphone are expected    metabolites of oxycodone. Oxymorphone is also available as a    scheduled prescription medication.  ==================================================================== Test                      Result    Flag   Units      Ref Range   Creatinine              253              mg/dL       >=20 ==================================================================== Declared Medications:  The flagging and interpretation on this report are based on the  following declared medications.  Unexpected results may arise from  inaccuracies in the declared medications.   **Note: The testing scope of this panel includes these medications:   Oxycodone (Percocet)   **Note: The testing scope of this panel does not include the  following reported medications:   Acetaminophen (Percocet)  Albuterol (Ventolin HFA)  Doxycycline (Vibramycin)  Gabapentin (Neurontin)  Insulin (NovoLog)  Ketoconazole (Nizoral)  Metformin (Glucophage)  Naproxen (Aleve)  Potassium (Klor-Con)  Ramipril (Altace)  Rosuvastatin (Crestor)  Semaglutide  Testosterone  Turmeric  Vitamin B1 ==================================================================== For clinical consultation, please call [phone]. ====================================================================      ROS  Constitutional: Denies any fever or chills Gastrointestinal: No reported hemesis, hematochezia, vomiting, or acute GI distress Musculoskeletal: Denies any acute onset joint swelling, redness,  loss of ROM, or weakness Neurological: No reported episodes of acute onset apraxia, aphasia, dysarthria, agnosia, amnesia, paralysis, loss of coordination, or loss of consciousness  Medication Review  Semaglutide(0.25 or 0.5MG/DOS), Testosterone, cyanocobalamin, gabapentin, insulin aspart, insulin degludec, ketoconazole, metFORMIN, naproxen sodium, oxyCODONE-acetaminophen, potassium citrate, ramipril, and rosuvastatin  History Review  Allergy: Mr. Weigelt has No Known Allergies. Drug: Mr. Salemi  reports no history of drug use. Alcohol:  reports current alcohol use. Tobacco:  reports that he has never smoked. He has never used smokeless tobacco. Social: Mr. Fairhurst  reports that he has never smoked. He has never used smokeless  tobacco. He reports current alcohol use. He reports that he does not use drugs. 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 Surgical: Mr. Lowe  has a past surgical history that includes Tonsillectomy; Shoulder surgery; Palate surgery; Cervical fusion; Foot surgery; Lumbar laminectomy/decompression microdiscectomy (N/A, 02/06/2013); Knee arthroscopy (Right, 10/02/2013); Knee arthroscopy with medial menisectomy (Right, 03/25/2015); Knee bursectomy (Right, 03/25/2015); Cystoscopy w/ retrogrades (Right, 11/19/2015); Cystoscopy w/ retrogrades (Right, 10/03/2017); Ureteroscopy (Right, 10/03/2017); and CTR (Left, 02/27/2018). Family: family history includes COPD in his mother; Cancer in his mother; Diabetes in his father; Heart disease in his father and mother.  Laboratory Chemistry Profile   Renal Lab Results  Component Value Date   BUN 17 12/27/2020   CREATININE 0.97 12/27/2020   GFRAA >60 07/01/2019   GFRNONAA >60 12/27/2020    Hepatic Lab Results  Component Value Date   AST 21 12/27/2020   ALT 24 12/27/2020   ALBUMIN 3.8 12/27/2020   ALKPHOS 82 12/27/2020    Electrolytes Lab Results  Component Value Date   NA 132 (L) 12/27/2020   K 4.2 12/27/2020   CL 100 12/27/2020   CALCIUM 9.0 12/27/2020   MG 1.8 03/12/2015    Bone No results found for: VD25OH, VD125OH2TOT, NO0370WU8, QB1694HW3, 25OHVITD1, 25OHVITD2, 25OHVITD3, TESTOFREE, TESTOSTERONE  Inflammation (CRP: Acute Phase) (ESR: Chronic Phase) Lab Results  Component Value Date   CRP 0.6 03/12/2015   ESRSEDRATE 9 03/12/2015         Note: Above Lab results reviewed.  Recent Imaging Review  CT Head Wo Contrast CLINICAL DATA:  Headache, new or worsening (Age >= 50y).  Blurred vision  EXAM: CT HEAD WITHOUT CONTRAST  TECHNIQUE: Contiguous axial images were obtained from the base of the skull through the vertex without intravenous contrast.  COMPARISON:  02/02/2011  FINDINGS: Brain: No acute intracranial abnormality. Specifically, no hemorrhage, hydrocephalus, mass lesion, acute infarction, or significant intracranial injury.  Vascular: No hyperdense vessel or unexpected calcification.  Skull: No acute calvarial abnormality.  Sinuses/Orbits: No acute findings  Other: None  IMPRESSION: Normal study.  Electronically Signed   By: Rolm Baptise M.D.   On: 12/27/2020 15:59 DG Chest 2 View CLINICAL DATA:  Headache and cough.  COVID-19 in October.  EXAM: CHEST - 2 VIEW  COMPARISON:  11/17/2020  FINDINGS: Lungs are hypoinflated without focal airspace consolidation or effusion. Mild stable cardiomegaly. Remainder of the exam is unchanged.  IMPRESSION: Hypoinflation without acute cardiopulmonary disease.  Electronically Signed   By: Marin Olp M.D.   On: 12/27/2020 15:56 Note: Reviewed        Physical Exam  General appearance: Well nourished, well developed, and well hydrated. In no apparent acute distress Mental status: Alert,  oriented x 3 (person, place, & time)       Respiratory: No evidence of acute respiratory distress Eyes: PERLA Vitals: BP 139/86    Pulse 98    Temp (!) 97.1 F (36.2 C)    Resp 16    Ht 5' 6" (1.676 m)    Wt 240 lb (108.9 kg)    SpO2 97%    BMI 38.74 kg/m  BMI: Estimated body mass index is 38.74 kg/m as calculated from the following:   Height as of this encounter: 5' 6" (1.676 m).   Weight as of this encounter: 240 lb (108.9 kg). Ideal: Ideal body weight: 63.8 kg (140 lb 10.5 oz) Adjusted ideal body weight: 81.8 kg (180 lb 6.3 oz)  Assessment   Status Diagnosis  Controlled Controlled Controlled 1. Chronic pain syndrome   2. Chronic low back pain (WC injury) (1ry area of Pain) (Bilateral) (R>L)  w/o sciatica   3. Osteoarthritis of spine with radiculopathy, lumbar region (WC injury)   4. Lumbosacral radiculopathy (S1) (Right)   5. Chronic knee pain (2ry area of Pain) (WC injury) (Right)   6. Chronic shoulder pain (3ry area of Pain) (Right)   7. Chronic lower extremity pain (Right)   8. Discogenic syndrome, lumbar (WC injury)   9. 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 Pharmacologic therapy   11. Chronic use of opiate for therapeutic purpose   12. Encounter for chronic pain management   13. Encounter for medication management      Updated Problems: No problems updated.  Plan of Care  Problem-specific:  No problem-specific Assessment & Plan notes found for this encounter.  Mr. Harlin Mazzoni. has a current medication list which includes the following long-term medication(s): [START ON 01/07/2021] oxycodone-acetaminophen, [START ON 02/06/2021] oxycodone-acetaminophen, [START ON 03/08/2021] oxycodone-acetaminophen, testosterone, and gabapentin.  Pharmacotherapy (Medications Ordered): Meds ordered this encounter  Medications   oxyCODONE-acetaminophen (PERCOCET) 5-325 MG tablet    Sig: Take 1 tablet by mouth every 6 (six) hours as needed for severe pain. Must last 30 days.    Dispense:  120 tablet    Refill:  0    DO NOT: delete (not duplicate); no partial-fill (will deny script to complete), no refill request (F/U required). DISPENSE: 1 day early if closed on fill date. WARN: No CNS-depressants within 8 hrs of med.   oxyCODONE-acetaminophen (PERCOCET) 5-325 MG tablet    Sig: Take 1 tablet by mouth every 6 (six) hours as needed for severe pain. Must last 30 days.    Dispense:  120 tablet    Refill:  0    DO NOT: delete (not duplicate); no partial-fill (will deny script to complete), no refill request (F/U required). DISPENSE: 1 day early if closed on fill date. WARN: No CNS-depressants within 8 hrs of med.   oxyCODONE-acetaminophen (PERCOCET) 5-325 MG tablet     Sig: Take 1 tablet by mouth every 6 (six) hours as needed for severe pain. Must last 30 days.    Dispense:  120 tablet    Refill:  0    DO NOT: delete (not duplicate); no partial-fill (will deny script to complete), no refill request (F/U required). DISPENSE: 1 day early if closed on fill date. WARN: No CNS-depressants within 8 hrs of med.   Orders:  No orders of the defined types were placed in this encounter.  Follow-up plan:   Return in about 3 months (around 04/07/2021) for Eval-day (M,W), (F2F), (MM).  Interventional Therapies  Risk   Complexity Considerations:   Estimated body mass index is 38.74 kg/m as calculated from the following:   Height as of this encounter: 5' 6" (1.676 m).   Weight as of this encounter: 240 lb (108.9 kg). WNL   Planned   Pending:      Under consideration:   Possible right Suprascapular nerve RFA  Possible left lumbar facet RFA  Diagnostic right knee genicular NB    Completed:   Diagnostic right caudal ESI x1 + epidurogram #1 (10/02/2019)  Palliative right lumbar facet MBB x4 (04/02/2019)  Palliative left lumbar facet MBB x2 (12/16/2015)  Palliative right lumbar facet RFA x1  (Right: 04/13/2016) (100% relief)  Diagnostic/therapeutic bilateral SI joint block x2 (11/17/2016)  Diagnostic right Suprascapular NB x1 (06/22/2016)    Therapeutic   Palliative (PRN) options:   Palliative lumbar facet block  Palliative right lumbar facet RFA #2  Diagnostic Suprascapular nerve Block     Recent Visits Date Type Provider Dept  11/04/20 Office Visit Milinda Pointer, MD [facility]  Showing recent visits within past 90 days and meeting all other requirements Today's Visits Date Type Provider Dept  01/04/21 Office Visit Milinda Pointer, MD [facility]  Showing today's visits and meeting all other requirements Future Appointments No visits were found meeting these conditions. Showing future appointments within next 90 days and  meeting all other requirements  I discussed the assessment and treatment plan with the patient. The patient was provided an opportunity to ask questions and all were answered. The patient agreed with the plan and demonstrated an understanding of the instructions.  Patient advised to call back or seek an in-person evaluation if the symptoms or condition worsens.  Duration of encounter: 30 minutes.  Note by: Gaspar Cola, MD Date: 01/04/2021; Time: 8:53 AM

## 2021-01-04 ENCOUNTER — Other Ambulatory Visit: Payer: Self-pay

## 2021-01-04 ENCOUNTER — Ambulatory Visit: Payer: BC Managed Care – PPO | Attending: Pain Medicine | Admitting: Pain Medicine

## 2021-01-04 ENCOUNTER — Encounter: Payer: Self-pay | Admitting: Pain Medicine

## 2021-01-04 VITALS — BP 139/86 | HR 98 | Temp 97.1°F | Resp 16 | Ht 66.0 in | Wt 240.0 lb

## 2021-01-04 DIAGNOSIS — M545 Low back pain, unspecified: Secondary | ICD-10-CM | POA: Diagnosis present

## 2021-01-04 DIAGNOSIS — Z79891 Long term (current) use of opiate analgesic: Secondary | ICD-10-CM

## 2021-01-04 DIAGNOSIS — M5417 Radiculopathy, lumbosacral region: Secondary | ICD-10-CM | POA: Diagnosis present

## 2021-01-04 DIAGNOSIS — M961 Postlaminectomy syndrome, not elsewhere classified: Secondary | ICD-10-CM

## 2021-01-04 DIAGNOSIS — M25561 Pain in right knee: Secondary | ICD-10-CM | POA: Diagnosis present

## 2021-01-04 DIAGNOSIS — M25511 Pain in right shoulder: Secondary | ICD-10-CM | POA: Insufficient documentation

## 2021-01-04 DIAGNOSIS — Z79899 Other long term (current) drug therapy: Secondary | ICD-10-CM

## 2021-01-04 DIAGNOSIS — M79604 Pain in right leg: Secondary | ICD-10-CM | POA: Insufficient documentation

## 2021-01-04 DIAGNOSIS — M4726 Other spondylosis with radiculopathy, lumbar region: Secondary | ICD-10-CM

## 2021-01-04 DIAGNOSIS — G8929 Other chronic pain: Secondary | ICD-10-CM | POA: Diagnosis present

## 2021-01-04 DIAGNOSIS — G894 Chronic pain syndrome: Secondary | ICD-10-CM

## 2021-01-04 DIAGNOSIS — M5126 Other intervertebral disc displacement, lumbar region: Secondary | ICD-10-CM

## 2021-01-04 DIAGNOSIS — M5136 Other intervertebral disc degeneration, lumbar region with discogenic back pain only: Secondary | ICD-10-CM

## 2021-01-04 MED ORDER — OXYCODONE-ACETAMINOPHEN 5-325 MG PO TABS: 1.0000 | ORAL_TABLET | Freq: Four times a day (QID) | ORAL | 0 refills | Status: DC | PRN

## 2021-01-04 MED ORDER — OXYCODONE-ACETAMINOPHEN 5-325 MG PO TABS
1.0000 | ORAL_TABLET | Freq: Four times a day (QID) | ORAL | 0 refills | Status: DC | PRN
Start: 2021-03-08 — End: 2021-03-29

## 2021-01-04 NOTE — Patient Instructions (Signed)
____________________________________________________________________________________________ ° °Medication Rules ° °Purpose: To inform patients, and their family members, of our rules and regulations. ° °Applies to: All patients receiving prescriptions (written or electronic). ° °Pharmacy of record: Pharmacy where electronic prescriptions will be sent. If written prescriptions are taken to a different pharmacy, please inform the nursing staff. The pharmacy listed in the electronic medical record should be the one where you would like electronic prescriptions to be sent. ° °Electronic prescriptions: In compliance with the [place] Strengthen Opioid Misuse Prevention (STOP) Act of 2017 (Session Law 2017-74/H243), effective January 17, 2018, all controlled substances must be electronically prescribed. Calling prescriptions to the pharmacy will cease to exist. ° °Prescription refills: Only during scheduled appointments. Applies to all prescriptions. ° °NOTE: The following applies primarily to controlled substances (Opioid* Pain Medications).  ° °Type of encounter (visit): For patients receiving controlled substances, face-to-face visits are required. (Not an option or up to the patient.) ° °Patient's responsibilities: °Pain Pills: Bring all pain pills to every appointment (except for procedure appointments). °Pill Bottles: Bring pills in original pharmacy bottle. Always bring the newest bottle. Bring bottle, even if empty. °Medication refills: You are responsible for knowing and keeping track of what medications you take and those you need refilled. °The day before your appointment: write a list of all prescriptions that need to be refilled. °The day of the appointment: give the list to the admitting nurse. Prescriptions will be written only during appointments. No prescriptions will be written on procedure days. °If you forget a medication: it will not be "Called in", "Faxed", or "electronically sent". You will  need to get another appointment to get these prescribed. °No early refills. Do not call asking to have your prescription filled early. °Prescription Accuracy: You are responsible for carefully inspecting your prescriptions before leaving our office. Have the discharge nurse carefully go over each prescription with you, before taking them home. Make sure that your name is accurately spelled, that your address is correct. Check the name and dose of your medication to make sure it is accurate. Check the number of pills, and the written instructions to make sure they are clear and accurate. Make sure that you are given enough medication to last until your next medication refill appointment. °Taking Medication: Take medication as prescribed. When it comes to controlled substances, taking less pills or less frequently than prescribed is permitted and encouraged. °Never take more pills than instructed. °Never take medication more frequently than prescribed.  °Inform other Doctors: Always inform, all of your healthcare providers, of all the medications you take. °Pain Medication from other Providers: You are not allowed to accept any additional pain medication from any other Doctor or Healthcare provider. There are two exceptions to this rule. (see below) In the event that you require additional pain medication, you are responsible for notifying us, as stated below. °Cough Medicine: Often these contain an opioid, such as codeine or hydrocodone. Never accept or take cough medicine containing these opioids if you are already taking an opioid* medication. The combination may cause respiratory failure and death. °Medication Agreement: You are responsible for carefully reading and following our Medication Agreement. This must be signed before receiving any prescriptions from our practice. Safely store a copy of your signed Agreement. Violations to the Agreement will result in no further prescriptions. (Additional copies of our  Medication Agreement are available upon request.) °Laws, Rules, & Regulations: All patients are expected to follow all Federal and State Laws, Statutes, Rules, & Regulations. Ignorance of   the Laws does not constitute a valid excuse.  °Illegal drugs and Controlled Substances: The use of illegal substances (including, but not limited to marijuana and its derivatives) and/or the illegal use of any controlled substances is strictly prohibited. Violation of this rule may result in the immediate and permanent discontinuation of any and all prescriptions being written by our practice. The use of any illegal substances is prohibited. °Adopted CDC guidelines & recommendations: Target dosing levels will be at or below 60 MME/day. Use of benzodiazepines** is not recommended. ° °Exceptions: There are only two exceptions to the rule of not receiving pain medications from other Healthcare Providers. °Exception #1 (Emergencies): In the event of an emergency (i.e.: accident requiring emergency care), you are allowed to receive additional pain medication. However, you are responsible for: As soon as you are able, call our office [phone], at any time of the day or night, and leave a message stating your name, the date and nature of the emergency, and the name and dose of the medication prescribed. In the event that your call is answered by a member of our staff, make sure to document and save the date, time, and the name of the person that took your information.  °Exception #2 (Planned Surgery): In the event that you are scheduled by another doctor or dentist to have any type of surgery or procedure, you are allowed (for a period no longer than 30 days), to receive additional pain medication, for the acute post-op pain. However, in this case, you are responsible for picking up a copy of our "Post-op Pain Management for Surgeons" handout, and giving it to your surgeon or dentist. This document is available at our office, and  does not require an appointment to obtain it. Simply go to our office during business hours (Monday-Thursday from 8:00 AM to 4:00 PM) (Friday 8:00 AM to 12:00 Noon) or if you have a scheduled appointment with us, prior to your surgery, and ask for it by name. In addition, you are responsible for: calling our office [phone], at any time of the day or night, and leaving a message stating your name, name of your surgeon, type of surgery, and date of procedure or surgery. Failure to comply with your responsibilities may result in termination of therapy involving the controlled substances. °Medication Agreement Violation. Following the above rules, including your responsibilities will help you in avoiding a Medication Agreement Violation (“Breaking your Pain Medication Contract”). ° °*Opioid medications include: morphine, codeine, oxycodone, oxymorphone, hydrocodone, hydromorphone, meperidine, tramadol, tapentadol, buprenorphine, fentanyl, methadone. °**Benzodiazepine medications include: diazepam (Valium), alprazolam (Xanax), clonazepam (Klonopine), lorazepam (Ativan), clorazepate (Tranxene), chlordiazepoxide (Librium), estazolam (Prosom), oxazepam (Serax), temazepam (Restoril), triazolam (Halcion) °(Last updated: 10/14/2020) °____________________________________________________________________________________________ ° ____________________________________________________________________________________________ ° °Medication Recommendations and Reminders ° °Applies to: All patients receiving prescriptions (written and/or electronic). ° °Medication Rules & Regulations: These rules and regulations exist for your safety and that of others. They are not flexible and neither are we. Dismissing or ignoring them will be considered "non-compliance" with medication therapy, resulting in complete and irreversible termination of such therapy. (See document titled "Medication Rules" for more details.) In all conscience,  because of safety reasons, we cannot continue providing a therapy where the patient does not follow instructions. ° °Pharmacy of record:  °Definition: This is the pharmacy where your electronic prescriptions will be sent.  °We do not endorse any particular pharmacy, however, we have experienced problems with [facility] not securing enough medication supply for the community. °We do not restrict you   in your choice of pharmacy. However, once we write for your prescriptions, we will NOT be re-sending more prescriptions to fix restricted supply problems created by your pharmacy, or your insurance.  °The pharmacy listed in the electronic medical record should be the one where you want electronic prescriptions to be sent. °If you choose to change pharmacy, simply notify our nursing staff. ° °Recommendations: °Keep all of your pain medications in a safe place, under lock and key, even if you live alone. We will NOT replace lost, stolen, or damaged medication. °After you fill your prescription, take 1 week's worth of pills and put them away in a safe place. You should keep a separate, properly labeled bottle for this purpose. The remainder should be kept in the original bottle. Use this as your primary supply, until it runs out. Once it's gone, then you know that you have 1 week's worth of medicine, and it is time to come in for a prescription refill. If you do this correctly, it is unlikely that you will ever run out of medicine. °To make sure that the above recommendation works, it is very important that you make sure your medication refill appointments are scheduled at least 1 week before you run out of medicine. To do this in an effective manner, make sure that you do not leave the office without scheduling your next medication management appointment. Always ask the nursing staff to show you in your prescription , when your medication will be running out. Then arrange for the receptionist to get you a return appointment,  at least 7 days before you run out of medicine. Do not wait until you have 1 or 2 pills left, to come in. This is very poor planning and does not take into consideration that we may need to cancel appointments due to bad weather, sickness, or emergencies affecting our staff. °DO NOT ACCEPT A "Partial Fill": If for any reason your pharmacy does not have enough pills/tablets to completely fill or refill your prescription, do not allow for a "partial fill". The law allows the pharmacy to complete that prescription within 72 hours, without requiring a new prescription. If they do not fill the rest of your prescription within those 72 hours, you will need a separate prescription to fill the remaining amount, which we will NOT provide. If the reason for the partial fill is your insurance, you will need to talk to the pharmacist about payment alternatives for the remaining tablets, but again, DO NOT ACCEPT A PARTIAL FILL, unless you can trust your pharmacist to obtain the remainder of the pills within 72 hours. ° °Prescription refills and/or changes in medication(s):  °Prescription refills, and/or changes in dose or medication, will be conducted only during scheduled medication management appointments. (Applies to both, written and electronic prescriptions.) °No refills on procedure days. No medication will be changed or started on procedure days. No changes, adjustments, and/or refills will be conducted on a procedure day. Doing so will interfere with the diagnostic portion of the procedure. °No phone refills. No medications will be "called into the pharmacy". °No Fax refills. °No weekend refills. °No Holliday refills. °No after hours refills. ° °Remember:  °Business hours are:  °Monday to Thursday 8:00 AM to 4:00 PM °Provider's Schedule: °Suad Autrey, MD - Appointments are:  °Medication management: Monday and Wednesday 8:00 AM to 4:00 PM °Procedure day: Tuesday and Thursday 7:30 AM to 4:00 PM °Bilal Lateef, MD -  Appointments are:  °Medication management: Tuesday and Thursday 8:00   AM to 4:00 PM °Procedure day: Monday and Wednesday 7:30 AM to 4:00 PM °(Last update: 08/07/2019) °____________________________________________________________________________________________ °  °

## 2021-01-04 NOTE — Progress Notes (Signed)
Nursing Pain Medication Assessment:  Safety precautions to be maintained throughout the outpatient stay will include: orient to surroundings, keep bed in low position, maintain call bell within reach at all times, provide assistance with transfer out of bed and ambulation.  Medication Inspection Compliance: Pill count conducted under aseptic conditions, in front of the patient. Neither the pills nor the bottle was removed from the patient's sight at any time. Once count was completed pills were immediately returned to the patient in their original bottle.  Medication: Oxycodone/APAP Pill/Patch Count:  22 of 120 pills remain Pill/Patch Appearance: Markings consistent with prescribed medication Bottle Appearance: Standard pharmacy container. Clearly labeled. Filled Date: 4 / 23 / 2022 Last Medication intake:  TodaySafety precautions to be maintained throughout the outpatient stay will include: orient to surroundings, keep bed in low position, maintain call bell within reach at all times, provide assistance with transfer out of bed and ambulation.

## 2021-03-20 NOTE — Progress Notes (Deleted)
No show to appointment.

## 2021-03-22 ENCOUNTER — Encounter: Payer: BC Managed Care – PPO | Admitting: Pain Medicine

## 2021-03-22 DIAGNOSIS — M5417 Radiculopathy, lumbosacral region: Secondary | ICD-10-CM

## 2021-03-22 DIAGNOSIS — M4726 Other spondylosis with radiculopathy, lumbar region: Secondary | ICD-10-CM

## 2021-03-22 DIAGNOSIS — M961 Postlaminectomy syndrome, not elsewhere classified: Secondary | ICD-10-CM

## 2021-03-22 DIAGNOSIS — G894 Chronic pain syndrome: Secondary | ICD-10-CM

## 2021-03-22 DIAGNOSIS — Z79899 Other long term (current) drug therapy: Secondary | ICD-10-CM

## 2021-03-22 DIAGNOSIS — G8929 Other chronic pain: Secondary | ICD-10-CM

## 2021-03-22 DIAGNOSIS — M5126 Other intervertebral disc displacement, lumbar region: Secondary | ICD-10-CM

## 2021-03-22 DIAGNOSIS — Z79891 Long term (current) use of opiate analgesic: Secondary | ICD-10-CM

## 2021-03-29 ENCOUNTER — Other Ambulatory Visit: Payer: Self-pay

## 2021-03-29 ENCOUNTER — Ambulatory Visit: Payer: BC Managed Care – PPO | Attending: Pain Medicine | Admitting: Pain Medicine

## 2021-03-29 DIAGNOSIS — M5126 Other intervertebral disc displacement, lumbar region: Secondary | ICD-10-CM

## 2021-03-29 DIAGNOSIS — M5136 Other intervertebral disc degeneration, lumbar region with discogenic back pain only: Secondary | ICD-10-CM

## 2021-03-29 DIAGNOSIS — G8929 Other chronic pain: Secondary | ICD-10-CM

## 2021-03-29 DIAGNOSIS — M961 Postlaminectomy syndrome, not elsewhere classified: Secondary | ICD-10-CM

## 2021-03-29 DIAGNOSIS — G894 Chronic pain syndrome: Secondary | ICD-10-CM | POA: Diagnosis not present

## 2021-03-29 DIAGNOSIS — M545 Low back pain, unspecified: Secondary | ICD-10-CM | POA: Diagnosis not present

## 2021-03-29 DIAGNOSIS — Z79899 Other long term (current) drug therapy: Secondary | ICD-10-CM

## 2021-03-29 DIAGNOSIS — Z79891 Long term (current) use of opiate analgesic: Secondary | ICD-10-CM

## 2021-03-29 DIAGNOSIS — M5417 Radiculopathy, lumbosacral region: Secondary | ICD-10-CM

## 2021-03-29 DIAGNOSIS — M4726 Other spondylosis with radiculopathy, lumbar region: Secondary | ICD-10-CM

## 2021-03-29 DIAGNOSIS — M25561 Pain in right knee: Secondary | ICD-10-CM | POA: Diagnosis not present

## 2021-03-29 DIAGNOSIS — M25511 Pain in right shoulder: Secondary | ICD-10-CM | POA: Diagnosis not present

## 2021-03-29 MED ORDER — OXYCODONE-ACETAMINOPHEN 5-325 MG PO TABS: 1.0000 | ORAL_TABLET | Freq: Four times a day (QID) | ORAL | 0 refills | Status: DC | PRN

## 2021-03-29 NOTE — Progress Notes (Signed)
Patient: Adam Petty.  Service Category: E/M  Provider: Gaspar Cola, MD  DOB: [birth year]  DOS: 03/29/2021  Location: Office  MRN: [id number]  Setting: Ambulatory outpatient  Referring Provider: No ref. provider found  Type: Established Patient  Specialty: Interventional Pain Management  PCP: System, Provider Not In  Location: Remote location  Delivery: TeleHealth     Virtual Encounter - Pain Management PROVIDER NOTE: Information contained herein reflects review and annotations entered in association with encounter. Interpretation of such information and data should be left to medically-trained personnel. Information provided to patient can be located elsewhere in the medical record under "Patient Instructions". Document created using STT-dictation technology, any transcriptional errors that may result from process are unintentional.    Contact & Pharmacy Preferred: [phone] Home: [phone] (home) Mobile: [phone] (mobile) E-mail: [email or web address]  [place] [address] Phone: [phone] Fax: [place] [address] [place] [address] Phone: [phone] Fax: [phone]   Pre-screening  Mr. Hove offered "in-person" vs "virtual" encounter. He indicated preferring virtual for this encounter.   Reason COVID-19*   Social distancing based on CDC and AMA recommendations.   I contacted [name]. on 03/29/2021 via telephone.      I clearly identified myself as Gaspar Cola, MD. I verified that I was speaking with the correct person using two identifiers (Name: Saurabh Hettich., and date of birth: 1960-03-02).  Consent I sought verbal advanced consent from [name]. for virtual visit interactions. I informed Mr. Kreis of possible security and  privacy concerns, risks, and limitations associated with providing "not-in-person" medical evaluation and management services. I also informed Mr. Tinnell of the availability of "in-person" appointments. Finally, I informed him that there would be a charge for the virtual visit and that he could be  personally, fully or partially, financially responsible for it. Mr. Hanrahan expressed understanding and agreed to proceed.   Historic Elements   Mr. Zollie Ellery. is a 61 y.o. year old, male patient evaluated today after our last contact on 03/22/2021. Mr. 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 He also  has a past surgical history that includes Tonsillectomy; Shoulder surgery; Palate surgery; Cervical fusion; Foot surgery; Lumbar laminectomy/decompression microdiscectomy (N/A, 02/06/2013); Knee arthroscopy (Right, 10/02/2013); Knee arthroscopy with medial menisectomy (Right, 03/25/2015); Knee bursectomy (Right, 03/25/2015); Cystoscopy w/ retrogrades (Right, 11/19/2015); Cystoscopy w/ retrogrades (Right, 10/03/2017); Ureteroscopy (Right, 10/03/2017); and CTR (Left, 02/27/2018). Mr. Spagnolo has a current medication list which includes the following prescription(s): cholecalciferol, cyanocobalamin, insulin degludec, meloxicam, metformin, methocarbamol, naproxen sodium, novolog flexpen, ramipril, rosuvastatin, semaglutide(0.25 or 0.82m/dos), gabapentin, and [START ON 04/07/2021] oxycodone-acetaminophen. He  reports that he has never smoked. He has never used smokeless tobacco. He reports current alcohol use. He reports that he does not use drugs. Mr. BNixhas No Known Allergies.   HPI  Today, he is being contacted for medication management.  The patient  indicates doing well with the current medication regimen. No adverse reactions or side effects reported to the medications.   RTCB: 05/07/2021 Nonopioids transfer 11/04/2019: Neurontin  Pharmacotherapy Assessment   Opioid Analgesic: Oxycodone IR 5  mg, 1 tab PO q 6 hrs (20 mg/day)  MME/day: 30 mg/day.   Monitoring: [place] PMP: PDMP not reviewed this encounter.       Pharmacotherapy: No side-effects or adverse reactions reported. Compliance: No problems identified. Effectiveness: Clinically acceptable. Plan: Refer to "POC". UDS:  Summary  Date Value Ref Range Status  08/05/2020 Note  Final    Comment:    ==================================================================== [employer] Select 13 (MW) ==================================================================== Test                             Result       Flag       Units  Drug Present and Declared for Prescription Verification   Oxycodone                      1383         EXPECTED   ng/mg creat   Oxymorphone                    1511         EXPECTED   ng/mg creat   Noroxycodone                   1033         EXPECTED   ng/mg creat   Noroxymorphone                 201          EXPECTED   ng/mg creat    Sources of oxycodone are scheduled prescription medications.    Oxymorphone, noroxycodone, and noroxymorphone are expected    metabolites of oxycodone. Oxymorphone is also available as a    scheduled prescription medication.  ==================================================================== Test                      Result    Flag   Units      Ref Range   Creatinine              253              mg/dL      >=20 ==================================================================== Declared Medications:  The flagging and interpretation on this report are based on the  following declared medications.  Unexpected results may arise from  inaccuracies in the declared medications.   **Note: The testing scope of this panel includes  these medications:   Oxycodone (Percocet)   **Note: The testing scope of this panel does not include the  following reported medications:   Acetaminophen (Percocet)  Albuterol (Ventolin HFA)  Doxycycline (Vibramycin)  Gabapentin (Neurontin)  Insulin (NovoLog)  Ketoconazole (Nizoral)  Metformin (Glucophage)  Naproxen (Aleve)  Potassium (Klor-Con)  Ramipril (Altace)  Rosuvastatin (Crestor)  Semaglutide  Testosterone  Turmeric  Vitamin B1 ==================================================================== For clinical consultation, please call [phone]. ====================================================================      Laboratory Chemistry Profile   Renal Lab Results  Component Value Date   BUN 17 12/27/2020   CREATININE 0.97 12/27/2020   GFRAA >60 07/01/2019   GFRNONAA >60 12/27/2020    Hepatic Lab Results  Component Value Date   AST 21 12/27/2020   ALT 24 12/27/2020   ALBUMIN 3.8 12/27/2020   ALKPHOS 82 12/27/2020    Electrolytes Lab Results  Component Value Date   NA 132 (L) 12/27/2020   K 4.2 12/27/2020   CL 100 12/27/2020   CALCIUM 9.0 12/27/2020  MG 1.8 03/12/2015    Bone No results found for: VD25OH, H139778, [id number], [id number], 25OHVITD1, 25OHVITD2, 25OHVITD3, TESTOFREE, TESTOSTERONE  Inflammation (CRP: Acute Phase) (ESR: Chronic Phase) Lab Results  Component Value Date   CRP 0.6 03/12/2015   ESRSEDRATE 9 03/12/2015         Note: Above Lab results reviewed.  Imaging  CT Head Wo Contrast CLINICAL DATA:  Headache, new or worsening (Age >= 50y). Blurred vision  EXAM: CT HEAD WITHOUT CONTRAST  TECHNIQUE: Contiguous axial images were obtained from the base of the skull through the vertex without intravenous contrast.  COMPARISON:  02/02/2011  FINDINGS: Brain: No acute intracranial abnormality. Specifically, no hemorrhage, hydrocephalus, mass lesion, acute infarction, or significant intracranial injury.  Vascular:  No hyperdense vessel or unexpected calcification.  Skull: No acute calvarial abnormality.  Sinuses/Orbits: No acute findings  Other: None  IMPRESSION: Normal study.  Electronically Signed   By: Rolm Baptise M.D.   On: 12/27/2020 15:59 DG Chest 2 View CLINICAL DATA:  Headache and cough.  COVID-19 in October.  EXAM: CHEST - 2 VIEW  COMPARISON:  11/17/2020  FINDINGS: Lungs are hypoinflated without focal airspace consolidation or effusion. Mild stable cardiomegaly. Remainder of the exam is unchanged.  IMPRESSION: Hypoinflation without acute cardiopulmonary disease.  Electronically Signed   By: Marin Olp M.D.   On: 12/27/2020 15:56  Assessment  The primary encounter diagnosis was Chronic pain syndrome. 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  Plan of Care  Problem-specific:  No problem-specific Assessment & Plan notes found for this encounter.  Mr. Madeline Bebout. has a current medication list which includes the following long-term medication(s): gabapentin and [START ON 04/07/2021] oxycodone-acetaminophen.  Pharmacotherapy (Medications Ordered): Meds ordered this encounter  Medications   oxyCODONE-acetaminophen (PERCOCET) 5-325 MG tablet    Sig: Take 1 tablet by mouth every 6 (six) hours as needed for severe pain. Must last 30 days.    Dispense:  120 tablet    Refill:  0    DO NOT:  delete (not duplicate); no partial-fill (will deny script to complete), no refill request (F/U required). DISPENSE: 1 day early if closed on fill date. WARN: No CNS-depressants within 8 hrs of med.   Orders:  No orders of the defined types were placed in this encounter.  Follow-up plan:   Return in about 6 weeks (around 05/07/2021) for Eval-day (M,W), (F2F), (MM).     Interventional Therapies  Risk   Complexity Considerations:   Estimated body mass index is 38.74 kg/m as calculated from the following:   Height as of this encounter: '5\' 6"'  (1.676 m).   Weight as of this encounter: 240 lb (108.9 kg). WNL   Planned   Pending:      Under consideration:   Possible right Suprascapular nerve RFA  Possible left lumbar facet RFA  Diagnostic right knee genicular NB    Completed:   Diagnostic right caudal ESI x1 + epidurogram #1 (10/02/2019)  Palliative right lumbar facet MBB x4 (04/02/2019)  Palliative left lumbar facet MBB x2 (12/16/2015)  Palliative right lumbar facet RFA x1  (Right: 04/13/2016) (100% relief)  Diagnostic/therapeutic bilateral SI joint block x2 (11/17/2016)  Diagnostic right Suprascapular NB x1 (06/22/2016)    Therapeutic   Palliative (PRN) options:   Palliative lumbar facet block  Palliative right lumbar facet RFA #2  Diagnostic Suprascapular nerve Block     Recent Visits Date Type Provider Dept  01/04/21 Office Visit Milinda Pointer, MD [facility]  Showing recent visits within past 90 days and meeting all other requirements Today's Visits Date Type Provider Dept  03/29/21 Office Visit Milinda Pointer, MD [facility]  Showing today's visits and meeting all other requirements Future Appointments No visits were found meeting these conditions. Showing future appointments within next 90 days and meeting all other requirements  I discussed the assessment and treatment plan with the patient. The patient was provided an opportunity to ask  questions and all were answered. The patient agreed with the plan and demonstrated an understanding of the instructions.  Patient advised to call back or seek an in-person evaluation if the symptoms or condition worsens.  Duration of encounter: 30 minutes.  Note by: Gaspar Cola, MD Date: 03/29/2021; Time: 1:57 PM

## 2021-03-29 NOTE — Patient Instructions (Signed)
____________________________________________________________________________________________ ° °Medication Rules ° °Purpose: To inform patients, and their family members, of our rules and regulations. ° °Applies to: All patients receiving prescriptions (written or electronic). ° °Pharmacy of record: Pharmacy where electronic prescriptions will be sent. If written prescriptions are taken to a different pharmacy, please inform the nursing staff. The pharmacy listed in the electronic medical record should be the one where you would like electronic prescriptions to be sent. ° °Electronic prescriptions: In compliance with the [place] Strengthen Opioid Misuse Prevention (STOP) Act of 2017 (Session Law 2017-74/H243), effective January 17, 2018, all controlled substances must be electronically prescribed. Calling prescriptions to the pharmacy will cease to exist. ° °Prescription refills: Only during scheduled appointments. Applies to all prescriptions. ° °NOTE: The following applies primarily to controlled substances (Opioid* Pain Medications).  ° °Type of encounter (visit): For patients receiving controlled substances, face-to-face visits are required. (Not an option or up to the patient.) ° °Patient's responsibilities: °Pain Pills: Bring all pain pills to every appointment (except for procedure appointments). °Pill Bottles: Bring pills in original pharmacy bottle. Always bring the newest bottle. Bring bottle, even if empty. °Medication refills: You are responsible for knowing and keeping track of what medications you take and those you need refilled. °The day before your appointment: write a list of all prescriptions that need to be refilled. °The day of the appointment: give the list to the admitting nurse. Prescriptions will be written only during appointments. No prescriptions will be written on procedure days. °If you forget a medication: it will not be "Called in", "Faxed", or "electronically sent". You will  need to get another appointment to get these prescribed. °No early refills. Do not call asking to have your prescription filled early. °Prescription Accuracy: You are responsible for carefully inspecting your prescriptions before leaving our office. Have the discharge nurse carefully go over each prescription with you, before taking them home. Make sure that your name is accurately spelled, that your address is correct. Check the name and dose of your medication to make sure it is accurate. Check the number of pills, and the written instructions to make sure they are clear and accurate. Make sure that you are given enough medication to last until your next medication refill appointment. °Taking Medication: Take medication as prescribed. When it comes to controlled substances, taking less pills or less frequently than prescribed is permitted and encouraged. °Never take more pills than instructed. °Never take medication more frequently than prescribed.  °Inform other Doctors: Always inform, all of your healthcare providers, of all the medications you take. °Pain Medication from other Providers: You are not allowed to accept any additional pain medication from any other Doctor or Healthcare provider. There are two exceptions to this rule. (see below) In the event that you require additional pain medication, you are responsible for notifying us, as stated below. °Cough Medicine: Often these contain an opioid, such as codeine or hydrocodone. Never accept or take cough medicine containing these opioids if you are already taking an opioid* medication. The combination may cause respiratory failure and death. °Medication Agreement: You are responsible for carefully reading and following our Medication Agreement. This must be signed before receiving any prescriptions from our practice. Safely store a copy of your signed Agreement. Violations to the Agreement will result in no further prescriptions. (Additional copies of our  Medication Agreement are available upon request.) °Laws, Rules, & Regulations: All patients are expected to follow all Federal and State Laws, Statutes, Rules, & Regulations. Ignorance of   the Laws does not constitute a valid excuse.  °Illegal drugs and Controlled Substances: The use of illegal substances (including, but not limited to marijuana and its derivatives) and/or the illegal use of any controlled substances is strictly prohibited. Violation of this rule may result in the immediate and permanent discontinuation of any and all prescriptions being written by our practice. The use of any illegal substances is prohibited. °Adopted CDC guidelines & recommendations: Target dosing levels will be at or below 60 MME/day. Use of benzodiazepines** is not recommended. ° °Exceptions: There are only two exceptions to the rule of not receiving pain medications from other Healthcare Providers. °Exception #1 (Emergencies): In the event of an emergency (i.e.: accident requiring emergency care), you are allowed to receive additional pain medication. However, you are responsible for: As soon as you are able, call our office [phone], at any time of the day or night, and leave a message stating your name, the date and nature of the emergency, and the name and dose of the medication prescribed. In the event that your call is answered by a member of our staff, make sure to document and save the date, time, and the name of the person that took your information.  °Exception #2 (Planned Surgery): In the event that you are scheduled by another doctor or dentist to have any type of surgery or procedure, you are allowed (for a period no longer than 30 days), to receive additional pain medication, for the acute post-op pain. However, in this case, you are responsible for picking up a copy of our "Post-op Pain Management for Surgeons" handout, and giving it to your surgeon or dentist. This document is available at our office, and  does not require an appointment to obtain it. Simply go to our office during business hours (Monday-Thursday from 8:00 AM to 4:00 PM) (Friday 8:00 AM to 12:00 Noon) or if you have a scheduled appointment with us, prior to your surgery, and ask for it by name. In addition, you are responsible for: calling our office [phone], at any time of the day or night, and leaving a message stating your name, name of your surgeon, type of surgery, and date of procedure or surgery. Failure to comply with your responsibilities may result in termination of therapy involving the controlled substances. °Medication Agreement Violation. Following the above rules, including your responsibilities will help you in avoiding a Medication Agreement Violation (“Breaking your Pain Medication Contract”). ° °*Opioid medications include: morphine, codeine, oxycodone, oxymorphone, hydrocodone, hydromorphone, meperidine, tramadol, tapentadol, buprenorphine, fentanyl, methadone. °**Benzodiazepine medications include: diazepam (Valium), alprazolam (Xanax), clonazepam (Klonopine), lorazepam (Ativan), clorazepate (Tranxene), chlordiazepoxide (Librium), estazolam (Prosom), oxazepam (Serax), temazepam (Restoril), triazolam (Halcion) °(Last updated: 10/14/2020) °____________________________________________________________________________________________ ° ____________________________________________________________________________________________ ° °Medication Recommendations and Reminders ° °Applies to: All patients receiving prescriptions (written and/or electronic). ° °Medication Rules & Regulations: These rules and regulations exist for your safety and that of others. They are not flexible and neither are we. Dismissing or ignoring them will be considered "non-compliance" with medication therapy, resulting in complete and irreversible termination of such therapy. (See document titled "Medication Rules" for more details.) In all conscience,  because of safety reasons, we cannot continue providing a therapy where the patient does not follow instructions. ° °Pharmacy of record:  °Definition: This is the pharmacy where your electronic prescriptions will be sent.  °We do not endorse any particular pharmacy, however, we have experienced problems with [facility] not securing enough medication supply for the community. °We do not restrict you   in your choice of pharmacy. However, once we write for your prescriptions, we will NOT be re-sending more prescriptions to fix restricted supply problems created by your pharmacy, or your insurance.  °The pharmacy listed in the electronic medical record should be the one where you want electronic prescriptions to be sent. °If you choose to change pharmacy, simply notify our nursing staff. ° °Recommendations: °Keep all of your pain medications in a safe place, under lock and key, even if you live alone. We will NOT replace lost, stolen, or damaged medication. °After you fill your prescription, take 1 week's worth of pills and put them away in a safe place. You should keep a separate, properly labeled bottle for this purpose. The remainder should be kept in the original bottle. Use this as your primary supply, until it runs out. Once it's gone, then you know that you have 1 week's worth of medicine, and it is time to come in for a prescription refill. If you do this correctly, it is unlikely that you will ever run out of medicine. °To make sure that the above recommendation works, it is very important that you make sure your medication refill appointments are scheduled at least 1 week before you run out of medicine. To do this in an effective manner, make sure that you do not leave the office without scheduling your next medication management appointment. Always ask the nursing staff to show you in your prescription , when your medication will be running out. Then arrange for the receptionist to get you a return appointment,  at least 7 days before you run out of medicine. Do not wait until you have 1 or 2 pills left, to come in. This is very poor planning and does not take into consideration that we may need to cancel appointments due to bad weather, sickness, or emergencies affecting our staff. °DO NOT ACCEPT A "Partial Fill": If for any reason your pharmacy does not have enough pills/tablets to completely fill or refill your prescription, do not allow for a "partial fill". The law allows the pharmacy to complete that prescription within 72 hours, without requiring a new prescription. If they do not fill the rest of your prescription within those 72 hours, you will need a separate prescription to fill the remaining amount, which we will NOT provide. If the reason for the partial fill is your insurance, you will need to talk to the pharmacist about payment alternatives for the remaining tablets, but again, DO NOT ACCEPT A PARTIAL FILL, unless you can trust your pharmacist to obtain the remainder of the pills within 72 hours. ° °Prescription refills and/or changes in medication(s):  °Prescription refills, and/or changes in dose or medication, will be conducted only during scheduled medication management appointments. (Applies to both, written and electronic prescriptions.) °No refills on procedure days. No medication will be changed or started on procedure days. No changes, adjustments, and/or refills will be conducted on a procedure day. Doing so will interfere with the diagnostic portion of the procedure. °No phone refills. No medications will be "called into the pharmacy". °No Fax refills. °No weekend refills. °No Holliday refills. °No after hours refills. ° °Remember:  °Business hours are:  °Monday to Thursday 8:00 AM to 4:00 PM °Provider's Schedule: °Yahshua Thibault, MD - Appointments are:  °Medication management: Monday and Wednesday 8:00 AM to 4:00 PM °Procedure day: Tuesday and Thursday 7:30 AM to 4:00 PM °Bilal Lateef, MD -  Appointments are:  °Medication management: Tuesday and Thursday 8:00   AM to 4:00 PM °Procedure day: Monday and Wednesday 7:30 AM to 4:00 PM °(Last update: 08/07/2019) °____________________________________________________________________________________________ ° ____________________________________________________________________________________________ ° °CBD (cannabidiol) & Delta-8 (Delta-8 tetrahydrocannabinol) WARNING ° °Intro: Cannabidiol (CBD) and tetrahydrocannabinol (THC), are two natural compounds found in plants of the Cannabis genus. They can both be extracted from hemp or cannabis. Hemp and cannabis come from the Cannabis sativa plant. Both compounds interact with your body’s endocannabinoid system, but they have very different effects. CBD does not produce the high sensation associated with cannabis. Delta-8 tetrahydrocannabinol, also known as delta-8 THC, is a psychoactive substance found in the Cannabis sativa plant, of which marijuana and hemp are two varieties. THC is responsible for the high associated with the illicit use of marijuana. ° °Applicable to: All individuals currently taking or considering taking CBD (cannabidiol) and, more important, all patients taking opioid analgesic controlled substances (pain medication). (Example: oxycodone; oxymorphone; hydrocodone; hydromorphone; morphine; methadone; tramadol; tapentadol; fentanyl; buprenorphine; butorphanol; dextromethorphan; meperidine; codeine; etc.) ° °Legal status: CBD remains a Schedule I drug prohibited for any use. CBD is illegal with one exception. In the [place], CBD has a limited Food and Drug Administration (FDA) approval for the treatment of two specific types of epilepsy disorders. Only one CBD product has been approved by the FDA for this purpose: "Epidiolex". FDA is aware that some companies are marketing products containing cannabis and cannabis-derived compounds in ways that violate the Federal Food, Drug and Cosmetic Act  (FD&C Act) and that may put the health and safety of consumers at risk. The FDA, a Federal agency, has not enforced the CBD status since 2018. UPDATE: (03/05/2021) The Drug Enforcement Agency (DEA) issued a letter stating that "delta" cannabinoids, including Delta-8-THCO and Delta-9-THCO, synthetically derived from hemp do not qualify as hemp and will be viewed as Schedule I drugs. (Schedule I drugs, substances, or chemicals are defined as drugs with no currently accepted medical use and a high potential for abuse. Some examples of Schedule I drugs are: heroin, lysergic acid diethylamide (LSD), marijuana (cannabis), 3,4-methylenedioxymethamphetamine (ecstasy), methaqualone, and peyote.) (https://www.dea.gov) ° °Legality: Some manufacturers ship CBD products nationally, which is illegal. Often such products are sold online and are therefore available throughout the country. CBD is openly sold in head shops and health food stores in some states where such sales have not been explicitly legalized. Selling unapproved products with unsubstantiated therapeutic claims is not only a violation of the law, but also can put patients at risk, as these products have not been proven to be safe or effective. Federal illegality makes it difficult to conduct research on CBD. ° °Reference: "FDA Regulation of Cannabis and Cannabis-Derived Products, Including Cannabidiol (CBD)" - https://www.fda.gov/news-events/public-health-focus/fda-regulation-cannabis-and-cannabis-derived-products-including-cannabidiol-cbd ° °Warning: CBD is not FDA approved and has not undergo the same manufacturing controls as prescription drugs.  This means that the purity and safety of available CBD may be questionable. Most of the time, despite manufacturer's claims, it is contaminated with THC (delta-9-tetrahydrocannabinol - the chemical in marijuana responsible for the "HIGH").  When this is the case, the THC contaminant will trigger a positive urine drug  screen (UDS) test for Marijuana (carboxy-THC). Because a positive UDS for any illicit substance is a violation of our medication agreement, your opioid analgesics (pain medicine) may be permanently discontinued. °The FDA recently put out a warning about 5 things that everyone should be aware of regarding Delta-8 THC: °Delta-8 THC products have not been evaluated or approved by the FDA for safe use and may be marketed in ways that put the   public health at risk. °The FDA has received adverse event reports involving delta-8 THC-containing products. °Delta-8 THC has psychoactive and intoxicating effects. °Delta-8 THC manufacturing often involve use of potentially harmful chemicals to create the concentrations of delta-8 THC claimed in the marketplace. The final delta-8 THC product may have potentially harmful by-products (contaminants) due to the chemicals used in the process. Manufacturing of delta-8 THC products may occur in uncontrolled or unsanitary settings, which may lead to the presence of unsafe contaminants or other potentially harmful substances. °Delta-8 THC products should be kept out of the reach of children and pets. ° °MORE ABOUT CBD ° °General Information: CBD was discovered in 1940 and it is a derivative of the cannabis sativa genus plants (Marijuana and Hemp). It is one of the 113 identified substances found in Marijuana. It accounts for up to 40% of the plant's extract. As of 2018, preliminary clinical studies on CBD included research for the treatment of anxiety, movement disorders, and pain. CBD is available and consumed in multiple forms, including inhalation of smoke or vapor, as an aerosol spray, and by mouth. It may be supplied as an oil containing CBD, capsules, dried cannabis, or as a liquid solution. CBD is thought not to be as psychoactive as THC (delta-9-tetrahydrocannabinol - the chemical in marijuana responsible for the "HIGH"). Studies suggest that CBD may interact with different  biological target receptors in the body, including cannabinoid and other neurotransmitter receptors. As of 2018 the mechanism of action for its biological effects has not been determined. ° °Side-effects   Adverse reactions: Dry mouth, diarrhea, decreased appetite, fatigue, drowsiness, malaise, weakness, sleep disturbances, and others. ° °Drug interactions: CBC may interact with other medications such as blood-thinners. Because CBD causes drowsiness on its own, it also increases the drowsiness caused by other medications, including antihistamines (such as Benadryl), benzodiazepines (Xanax, Ativan, Valium), antipsychotics, antidepressants and opioids, as well as alcohol and supplements such as kava, melatonin and St. John's Wort. Be cautious with the following combinations:  ° °Brivaracetam (Briviact) °Brivaracetam is changed and broken down by the body. CBD might decrease how quickly the body breaks down brivaracetam. This might increase levels of brivaracetam in the body. ° °Caffeine °Caffeine is changed and broken down by the body. CBD might decrease how quickly the body breaks down caffeine. This might increase levels of caffeine in the body. ° °Carbamazepine (Tegretol) °Carbamazepine is changed and broken down by the body. CBD might decrease how quickly the body breaks down carbamazepine. This might increase levels of carbamazepine in the body and increase its side effects. ° °Citalopram (Celexa) °Citalopram is changed and broken down by the body. CBD might decrease how quickly the body breaks down citalopram. This might increase levels of citalopram in the body and increase its side effects. ° °Clobazam (Onfi) °Clobazam is changed and broken down by the liver. CBD might decrease how quickly the liver breaks down clobazam. This might increase the effects and side effects of clobazam. ° °Eslicarbazepine (Aptiom) °Eslicarbazepine is changed and broken down by the body. CBD might decrease how quickly the body  breaks down eslicarbazepine. This might increase levels of eslicarbazepine in the body by a small amount. ° °Everolimus (Zostress) °Everolimus is changed and broken down by the body. CBD might decrease how quickly the body breaks down everolimus. This might increase levels of everolimus in the body. ° °Lithium °Taking higher doses of CBD might increase levels of lithium. This can increase the risk of lithium toxicity. ° °Medications changed by the   liver (Cytochrome P450 1A1 (CYP1A1) substrates) °Some medications are changed and broken down by the liver. CBD might change how quickly the liver breaks down these medications. This could change the effects and side effects of these medications. ° °Medications changed by the liver (Cytochrome P450 1A2 (CYP1A2) substrates) °Some medications are changed and broken down by the liver. CBD might change how quickly the liver breaks down these medications. This could change the effects and side effects of these medications. ° °Medications changed by the liver (Cytochrome P450 1B1 (CYP1B1) substrates) °Some medications are changed and broken down by the liver. CBD might change how quickly the liver breaks down these medications. This could change the effects and side effects of these medications. ° °Medications changed by the liver (Cytochrome P450 2A6 (CYP2A6) substrates) °Some medications are changed and broken down by the liver. CBD might change how quickly the liver breaks down these medications. This could change the effects and side effects of these medications. ° °Medications changed by the liver (Cytochrome P450 2B6 (CYP2B6) substrates) °Some medications are changed and broken down by the liver. CBD might change how quickly the liver breaks down these medications. This could change the effects and side effects of these medications. ° °Medications changed by the liver (Cytochrome P450 2C19 (CYP2C19) substrates) °Some medications are changed and broken down by the liver.  CBD might change how quickly the liver breaks down these medications. This could change the effects and side effects of these medications. ° °Medications changed by the liver (Cytochrome P450 2C8 (CYP2C8) substrates) °Some medications are changed and broken down by the liver. CBD might change how quickly the liver breaks down these medications. This could change the effects and side effects of these medications. ° °Medications changed by the liver (Cytochrome P450 2C9 (CYP2C9) substrates) °Some medications are changed and broken down by the liver. CBD might change how quickly the liver breaks down these medications. This could change the effects and side effects of these medications. ° °Medications changed by the liver (Cytochrome P450 2D6 (CYP2D6) substrates) °Some medications are changed and broken down by the liver. CBD might change how quickly the liver breaks down these medications. This could change the effects and side effects of these medications. ° °Medications changed by the liver (Cytochrome P450 2E1 (CYP2E1) substrates) °Some medications are changed and broken down by the liver. CBD might change how quickly the liver breaks down these medications. This could change the effects and side effects of these medications. ° °Medications changed by the liver (Cytochrome P450 3A4 (CYP3A4) substrates) °Some medications are changed and broken down by the liver. CBD might change how quickly the liver breaks down these medications. This could change the effects and side effects of these medications. ° °Medications changed by the liver (Glucuronidated drugs) °Some medications are changed and broken down by the liver. CBD might change how quickly the liver breaks down these medications. This could change the effects and side effects of these medications. ° °Medications that decrease the breakdown of other medications by the liver (Cytochrome P450 2C19 (CYP2C19) inhibitors) °CBD is changed and broken down by the liver.  Some drugs decrease how quickly the liver changes and breaks down CBD. This could change the effects and side effects of CBD. ° °Medications that decrease the breakdown of other medications in the liver (Cytochrome P450 3A4 (CYP3A4) inhibitors) °CBD is changed and broken down by the liver. Some drugs decrease how quickly the liver changes and breaks down CBD. This could change the   effects and side effects of CBD. ° °Medications that increase breakdown of other medications by the liver (Cytochrome P450 3A4 (CYP3A4) inducers) °CBD is changed and broken down by the liver. Some drugs increase how quickly the liver changes and breaks down CBD. This could change the effects and side effects of CBD. ° °Medications that increase the breakdown of other medications by the liver (Cytochrome P450 2C19 (CYP2C19) inducers) °CBD is changed and broken down by the liver. Some drugs increase how quickly the liver changes and breaks down CBD. This could change the effects and side effects of CBD. ° °Methadone (Dolophine) °Methadone is broken down by the liver. CBD might decrease how quickly the liver breaks down methadone. Taking cannabidiol along with methadone might increase the effects and side effects of methadone. ° °Rufinamide (Banzel) °Rufinamide is changed and broken down by the body. CBD might decrease how quickly the body breaks down rufinamide. This might increase levels of rufinamide in the body by a small amount. ° °Sedative medications (CNS depressants) °CBD might cause sleepiness and slowed breathing. Some medications, called sedatives, can also cause sleepiness and slowed breathing. Taking CBD with sedative medications might cause breathing problems and/or too much sleepiness. ° °Sirolimus (Rapamune) °Sirolimus is changed and broken down by the body. CBD might decrease how quickly the body breaks down sirolimus. This might increase levels of sirolimus in the body. ° °Stiripentol (Diacomit) °Stiripentol is changed and  broken down by the body. CBD might decrease how quickly the body breaks down stiripentol. This might increase levels of stiripentol in the body and increase its side effects. ° °Tacrolimus (Prograf) °Tacrolimus is changed and broken down by the body. CBD might decrease how quickly the body breaks down tacrolimus. This might increase levels of tacrolimus in the body. ° °Tamoxifen (Soltamox) °Tamoxifen is changed and broken down by the body. CBD might affect how quickly the body breaks down tamoxifen. This might affect levels of tamoxifen in the body. ° °Topiramate (Topamax) °Topiramate is changed and broken down by the body. CBD might decrease how quickly the body breaks down topiramate. This might increase levels of topiramate in the body by a small amount. ° °Valproate °Valproic acid can cause liver injury. Taking cannabidiol with valproic acid might increase the chance of liver injury. CBD and/or valproic acid might need to be stopped, or the dose might need to be reduced. ° °Warfarin (Coumadin) °CBD might increase levels of warfarin, which can increase the risk for bleeding. CBD and/or warfarin might need to be stopped, or the dose might need to be reduced. ° °Zonisamide °Zonisamide is changed and broken down by the body. CBD might decrease how quickly the body breaks down zonisamide. This might increase levels of zonisamide in the body by a small amount. °(Last update: 03/17/2021) °____________________________________________________________________________________________ ° ____________________________________________________________________________________________ ° °Drug Holidays (Slow) ° °What is a "Drug Holiday"? °Drug Holiday: is the name given to the period of time during which a patient stops taking a medication(s) for the purpose of eliminating tolerance to the drug. ° °Benefits °Improved effectiveness of opioids. °Decreased opioid dose needed to achieve benefits. °Improved pain with lesser  dose. ° °What is tolerance? °Tolerance: is the progressive decreased in effectiveness of a drug due to its repetitive use. With repetitive use, the body gets use to the medication and as a consequence, it loses its effectiveness. This is a common problem seen with opioid pain medications. As a result, a larger dose of the drug is needed to achieve the same effect   that used to be obtained with a smaller dose. ° °How long should a "Drug Holiday" last? °You should stay off of the pain medicine for at least 14 consecutive days. (2 weeks) ° °Should I stop the medicine "cold turkey"? °No. You should always coordinate with your Pain Specialist so that he/she can provide you with the correct medication dose to make the transition as smoothly as possible. ° °How do I stop the medicine? °Slowly. You will be instructed to decrease the daily amount of pills that you take by one (1) pill every seven (7) days. This is called a "slow downward taper" of your dose. For example: if you normally take four (4) pills per day, you will be asked to drop this dose to three (3) pills per day for seven (7) days, then to two (2) pills per day for seven (7) days, then to one (1) per day for seven (7) days, and at the end of those last seven (7) days, this is when the "Drug Holiday" would start.  ° °Will I have withdrawals? °By doing a "slow downward taper" like this one, it is unlikely that you will experience any significant withdrawal symptoms. Typically, what triggers withdrawals is the sudden stop of a high dose opioid therapy. Withdrawals can usually be avoided by slowly decreasing the dose over a prolonged period of time. If you do not follow these instructions and decide to stop your medication abruptly, withdrawals may be possible. ° °What are withdrawals? °Withdrawals: refers to the wide range of symptoms that occur after stopping or dramatically reducing opiate drugs after heavy and prolonged use. Withdrawal symptoms do not occur to  patients that use low dose opioids, or those who take the medication sporadically. Contrary to benzodiazepine (example: Valium, Xanax, etc.) or alcohol withdrawals (“Delirium Tremens”), opioid withdrawals are not lethal. Withdrawals are the physical manifestation of the body getting rid of the excess receptors. ° °Expected Symptoms °Early symptoms of withdrawal may include: °Agitation °Anxiety °Muscle aches °Increased tearing °Insomnia °Runny nose °Sweating °Yawning ° °Late symptoms of withdrawal may include: °Abdominal cramping °Diarrhea °Dilated pupils °Goose bumps °Nausea °Vomiting ° °Will I experience withdrawals? °Due to the slow nature of the taper, it is very unlikely that you will experience any. ° °What is a slow taper? °Taper: refers to the gradual decrease in dose.  °(Last update: 08/07/2019) °____________________________________________________________________________________________ ° °  °

## 2021-04-05 ENCOUNTER — Encounter: Payer: BC Managed Care – PPO | Admitting: Pain Medicine

## 2021-04-19 NOTE — Progress Notes (Signed)
PROVIDER NOTE: Information contained herein reflects review and annotations entered in association with encounter. Interpretation of such information and data should be left to medically-trained personnel. Information provided to patient can be located elsewhere in the medical record under "Patient Instructions". Document created using STT-dictation technology, any transcriptional errors that may result from process are unintentional.  ?  ?Patient: Adam Petty.  Service Category: E/M  Provider: Gaspar Cola, MD  ?DOB: 1960/03/09  DOS: 04/20/2021  Specialty: Interventional Pain Management  ?MRN: [id number]  Setting: Ambulatory outpatient  PCP: System, Provider Not In  ?Type: Established Patient    Referring Provider: No ref. provider found  ?Location: Office  Delivery: Face-to-face    ? ?HPI  ?[name], a 61 y.o. year old male, is here today because of his Chronic bilateral low back pain without sciatica [M54.50, G89.29]. Adam Petty primary complain today is Back Pain (lower) ?Last encounter: My last encounter with him was on 03/29/2021. 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); Discogenic syndrome, lumbar (WC injury); Osteoarthritis of spine with radiculopathy, lumbar region (WC injury); Lumbar facet syndrome (WC injury) (Bilateral) (R>L); Lumbosacral radiculopathy (WC injury); Lumbar canal stenosis; Neurogenic pain; Neuropathic pain; Musculoskeletal pain; Lumbar spondylosis; Chronic knee pain (2ry area of Pain) (WC injury) (Right); Osteoarthritis of knee (Right); Chronic shoulder pain (3ry area of Pain) (Right); Osteoarthritis of shoulder (Right); Chronic lower extremity pain (Right); Recurrent nephrolithiasis; Acute medial meniscal tear; Chronic pain syndrome (WC injury); Acute low back pain without  sciatica; Osteoarthritis of glenohumeral joint (Right); Chronic sacroiliac joint pain (Bilateral) (R>L); Spondylosis without myelopathy or radiculopathy, lumbosacral region; DDD (degenerative disc disease), lumbosacral; DDD (degenerative disc disease), thoracic; Lumbosacral radiculopathy (S1) (Right); Inflammatory spondylopathy of lumbosacral region [facility]); DDD (degenerative disc disease), cervical; Cervical facet syndrome; Cervicalgia; and Cervicogenic headache on their pertinent problem list. ?Pain Assessment: Severity of Chronic pain is reported as a 4 /10. Location: Back Lower/right upper leg. Onset: More than a month ago. Quality: Shooting. Timing: Intermittent. Modifying factor(s): heat, hot bath. ?Vitals:  height is 5\' 6"  (1.676 m) and weight is 220 lb (99.8 kg). His temporal temperature is 98.1 ?F (36.7 ?C). His blood pressure is 142/85 (abnormal) and his pulse is 85. His respiration is 14 and oxygen saturation is 98%.  ? ?Reason for encounter: medication management.   The patient indicates doing well with the current medication regimen. No adverse reactions or side effects reported to the medications.  ? ?Today the patient indicates having an increase in his low back pain on the right side.  He refers that his worst pain is in the lower back and it then radiates to the back of the leg to the level of the knee but it does not go any further down than that.  This is a classical pain from his lumbar facet syndrome.  The last time that we treated this with a therapeutic lumbar facet block was on 04/02/2019.  He had a radiofrequency ablation done on 04/13/2016 which provided him with 100% relief of his pain for quite some time.  If he continues to have pain that returns any sooner than every 5 months, we will consider repeating this radiofrequency.  However, I would like for him to bring his BMI down to less than 35 kg/m? since this will probably help him the most.  Asides from this pain he refers that he will  occasionally have some pain that goes all the way down into the bottom of his foot but this only happens on very rare occasions when he has been standing for prolonged intervals of time. ? ?Today the patient also mentioned that he has been seen at the [facility] for an issue where his neck keeps popping.  He refers that this is associated with decreased range of motion especially with rotation of the cervical spine, but it is not associated with any pain, numbness, or weakness of the upper extremities and therefore it is not likely to be a radiculopathy.  He does indicate having occipital headaches associated with it suggesting that it is affecting the C2 and C3 regions, with most of the pain being in the neck (axial) suggesting that we are also dealing with a facet type of pain in the cervical region.  I have recommended that he follow their recommendations in terms of physical therapy to see if they can improve this, but if it does not or if it actually gets worse, as it is the case with facet disease, then we can go ahead and treat him with cervical facet blocks. ? ?In summary, the plan is to have him complete the physical therapy for the cervical spine and as soon as he can make arrangements, we will bring him in for a right lumbar facet block under fluoroscopic guidance.  Should this block not last, then we will move onto repeating the right lumbar facet medial branch radiofrequency ablation that he had done initially on 04/13/2016 with great success.  This plan was shared with the patient who understood and accepted. ? ?RTCB: 08/05/2021 ?Nonopioids transfer 11/04/2019: Neurontin ? ?Pharmacotherapy Assessment  ?Analgesic: Oxycodone IR 5 mg, 1 tab PO q 6 hrs (20 mg/day)  ?MME/day: 30 mg/day.  ? ?Monitoring: ?[place] PMP: PDMP reviewed during this encounter.       ?Pharmacotherapy: No side-effects or adverse reactions reported. ?Compliance: No problems identified. ?Effectiveness: Clinically acceptable. ? ?Landis Martins, RN  04/20/2021  3:07 PM  Sign when Signing Visit ?Nursing Pain Medication Assessment:  ?Safety precautions to be maintained throughout the outpatient stay will include: orient to surroundings, keep bed in low position, maintain call bell within reach at all times, provide assistance with transfer out of bed and ambulation.  ?Medication Inspection Compliance: Pill count conducted under aseptic conditions, in front of the patient. Neither the pills nor the bottle was removed from the patient's sight at any time. Once count was completed pills were immediately returned to the patient in their original bottle. ? ?Medication: Oxycodone/APAP ?Pill/Patch Count:  61 of 120 pills remain ?Pill/Patch Appearance: Markings consistent with prescribed medication ?Bottle Appearance: Standard pharmacy container. Clearly labeled. ?Filled Date: 03 / 22 / 2023 ?Last Medication intake:  Today ?   UDS:  ?Summary  ?Date Value Ref Range Status  ?08/05/2020 Note  Final  ?  Comment:  ?  ==================================================================== ?[employer] Select 13 (MW) ?==================================================================== ?Test                             Result       Flag       Units ? ?Drug Present and Declared for Prescription Verification ?  Oxycodone                      1383  EXPECTED   ng/mg creat ?  Oxymorphone                    1511         EXPECTED   ng/mg creat ?  Noroxycodone                   1033         EXPECTED   ng/mg creat ?  Noroxymorphone                 201          EXPECTED   ng/mg creat ?   Sources of oxycodone are scheduled prescription medications. ?   Oxymorphone, noroxycodone, and noroxymorphone are expected ?   metabolites of oxycodone. Oxymorphone is also available as a ?   scheduled prescription medication. ? ?==================================================================== ?Test                      Result    Flag   Units      Ref Range ?  Creatinine              253               mg/dL      >=20 ?==================================================================== ?Declared Medications: ? The flagging and interpretation on this report are based on the ? following d

## 2021-04-20 ENCOUNTER — Encounter: Payer: Self-pay | Admitting: Pain Medicine

## 2021-04-20 ENCOUNTER — Ambulatory Visit: Payer: BC Managed Care – PPO | Attending: Pain Medicine | Admitting: Pain Medicine

## 2021-04-20 VITALS — BP 142/85 | HR 85 | Temp 98.1°F | Resp 14 | Ht 66.0 in | Wt 220.0 lb

## 2021-04-20 DIAGNOSIS — M542 Cervicalgia: Secondary | ICD-10-CM | POA: Diagnosis present

## 2021-04-20 DIAGNOSIS — M25561 Pain in right knee: Secondary | ICD-10-CM | POA: Insufficient documentation

## 2021-04-20 DIAGNOSIS — M47812 Spondylosis without myelopathy or radiculopathy, cervical region: Secondary | ICD-10-CM | POA: Diagnosis present

## 2021-04-20 DIAGNOSIS — G8929 Other chronic pain: Secondary | ICD-10-CM | POA: Diagnosis present

## 2021-04-20 DIAGNOSIS — M545 Low back pain, unspecified: Secondary | ICD-10-CM | POA: Insufficient documentation

## 2021-04-20 DIAGNOSIS — G894 Chronic pain syndrome: Secondary | ICD-10-CM | POA: Diagnosis present

## 2021-04-20 DIAGNOSIS — M25511 Pain in right shoulder: Secondary | ICD-10-CM | POA: Diagnosis present

## 2021-04-20 DIAGNOSIS — M5126 Other intervertebral disc displacement, lumbar region: Secondary | ICD-10-CM | POA: Diagnosis present

## 2021-04-20 DIAGNOSIS — M79604 Pain in right leg: Secondary | ICD-10-CM | POA: Diagnosis present

## 2021-04-20 DIAGNOSIS — M5030 Other cervical disc degeneration, unspecified cervical region: Secondary | ICD-10-CM | POA: Insufficient documentation

## 2021-04-20 DIAGNOSIS — M47817 Spondylosis without myelopathy or radiculopathy, lumbosacral region: Secondary | ICD-10-CM | POA: Insufficient documentation

## 2021-04-20 DIAGNOSIS — M961 Postlaminectomy syndrome, not elsewhere classified: Secondary | ICD-10-CM | POA: Diagnosis present

## 2021-04-20 DIAGNOSIS — G4486 Cervicogenic headache: Secondary | ICD-10-CM | POA: Insufficient documentation

## 2021-04-20 DIAGNOSIS — Z79899 Other long term (current) drug therapy: Secondary | ICD-10-CM | POA: Diagnosis present

## 2021-04-20 DIAGNOSIS — Z79891 Long term (current) use of opiate analgesic: Secondary | ICD-10-CM | POA: Diagnosis present

## 2021-04-20 DIAGNOSIS — M47816 Spondylosis without myelopathy or radiculopathy, lumbar region: Secondary | ICD-10-CM | POA: Diagnosis present

## 2021-04-20 MED ORDER — OXYCODONE-ACETAMINOPHEN 5-325 MG PO TABS: 1.0000 | ORAL_TABLET | Freq: Four times a day (QID) | ORAL | 0 refills | Status: DC | PRN

## 2021-04-20 NOTE — Patient Instructions (Addendum)
____________________________________________________________________________________________ ? ?Medication Rules ? ?Purpose: To inform patients, and their family members, of our rules and regulations. ? ?Applies to: All patients receiving prescriptions (written or electronic). ? ?Pharmacy of record: Pharmacy where electronic prescriptions will be sent. If written prescriptions are taken to a different pharmacy, please inform the nursing staff. The pharmacy listed in the electronic medical record should be the one where you would like electronic prescriptions to be sent. ? ?Electronic prescriptions: In compliance with the [place] Strengthen Opioid Misuse Prevention (STOP) Act of 2017 (Session Law 2017-74/H243), effective January 17, 2018, all controlled substances must be electronically prescribed. Calling prescriptions to the pharmacy will cease to exist. ? ?Prescription refills: Only during scheduled appointments. Applies to all prescriptions. ? ?NOTE: The following applies primarily to controlled substances (Opioid* Pain Medications).  ? ?Type of encounter (visit): For patients receiving controlled substances, face-to-face visits are required. (Not an option or up to the patient.) ? ?Patient's responsibilities: ?Pain Pills: Bring all pain pills to every appointment (except for procedure appointments). ?Pill Bottles: Bring pills in original pharmacy bottle. Always bring the newest bottle. Bring bottle, even if empty. ?Medication refills: You are responsible for knowing and keeping track of what medications you take and those you need refilled. ?The day before your appointment: write a list of all prescriptions that need to be refilled. ?The day of the appointment: give the list to the admitting nurse. Prescriptions will be written only during appointments. No prescriptions will be written on procedure days. ?If you forget a medication: it will not be "Called in", "Faxed", or "electronically sent". You will  need to get another appointment to get these prescribed. ?No early refills. Do not call asking to have your prescription filled early. ?Prescription Accuracy: You are responsible for carefully inspecting your prescriptions before leaving our office. Have the discharge nurse carefully go over each prescription with you, before taking them home. Make sure that your name is accurately spelled, that your address is correct. Check the name and dose of your medication to make sure it is accurate. Check the number of pills, and the written instructions to make sure they are clear and accurate. Make sure that you are given enough medication to last until your next medication refill appointment. ?Taking Medication: Take medication as prescribed. When it comes to controlled substances, taking less pills or less frequently than prescribed is permitted and encouraged. ?Never take more pills than instructed. ?Never take medication more frequently than prescribed.  ?Inform other Doctors: Always inform, all of your healthcare providers, of all the medications you take. ?Pain Medication from other Providers: You are not allowed to accept any additional pain medication from any other Doctor or Healthcare provider. There are two exceptions to this rule. (see below) In the event that you require additional pain medication, you are responsible for notifying us, as stated below. ?Cough Medicine: Often these contain an opioid, such as codeine or hydrocodone. Never accept or take cough medicine containing these opioids if you are already taking an opioid* medication. The combination may cause respiratory failure and death. ?Medication Agreement: You are responsible for carefully reading and following our Medication Agreement. This must be signed before receiving any prescriptions from our practice. Safely store a copy of your signed Agreement. Violations to the Agreement will result in no further prescriptions. (Additional copies of our  Medication Agreement are available upon request.) ?Laws, Rules, & Regulations: All patients are expected to follow all Federal and State Laws, Statutes, Rules, & Regulations. Ignorance of   the Laws does not constitute a valid excuse.  ?Illegal drugs and Controlled Substances: The use of illegal substances (including, but not limited to marijuana and its derivatives) and/or the illegal use of any controlled substances is strictly prohibited. Violation of this rule may result in the immediate and permanent discontinuation of any and all prescriptions being written by our practice. The use of any illegal substances is prohibited. ?Adopted CDC guidelines & recommendations: Target dosing levels will be at or below 60 MME/day. Use of benzodiazepines** is not recommended. ? ?Exceptions: There are only two exceptions to the rule of not receiving pain medications from other Healthcare Providers. ?Exception #1 (Emergencies): In the event of an emergency (i.e.: accident requiring emergency care), you are allowed to receive additional pain medication. However, you are responsible for: As soon as you are able, call our office [phone], at any time of the day or night, and leave a message stating your name, the date and nature of the emergency, and the name and dose of the medication prescribed. In the event that your call is answered by a member of our staff, make sure to document and save the date, time, and the name of the person that took your information.  ?Exception #2 (Planned Surgery): In the event that you are scheduled by another doctor or dentist to have any type of surgery or procedure, you are allowed (for a period no longer than 30 days), to receive additional pain medication, for the acute post-op pain. However, in this case, you are responsible for picking up a copy of our "Post-op Pain Management for Surgeons" handout, and giving it to your surgeon or dentist. This document is available at our office, and  does not require an appointment to obtain it. Simply go to our office during business hours (Monday-Thursday from 8:00 AM to 4:00 PM) (Friday 8:00 AM to 12:00 Noon) or if you have a scheduled appointment with us, prior to your surgery, and ask for it by name. In addition, you are responsible for: calling our office [phone], at any time of the day or night, and leaving a message stating your name, name of your surgeon, type of surgery, and date of procedure or surgery. Failure to comply with your responsibilities may result in termination of therapy involving the controlled substances. ?Medication Agreement Violation. Following the above rules, including your responsibilities will help you in avoiding a Medication Agreement Violation (?Breaking your Pain Medication Contract?). ? ?*Opioid medications include: morphine, codeine, oxycodone, oxymorphone, hydrocodone, hydromorphone, meperidine, tramadol, tapentadol, buprenorphine, fentanyl, methadone. ?**Benzodiazepine medications include: diazepam (Valium), alprazolam (Xanax), clonazepam (Klonopine), lorazepam (Ativan), clorazepate (Tranxene), chlordiazepoxide (Librium), estazolam (Prosom), oxazepam (Serax), temazepam (Restoril), triazolam (Halcion) ?(Last updated: 10/14/2020) ?____________________________________________________________________________________________ ? ____________________________________________________________________________________________ ? ?Medication Recommendations and Reminders ? ?Applies to: All patients receiving prescriptions (written and/or electronic). ? ?Medication Rules & Regulations: These rules and regulations exist for your safety and that of others. They are not flexible and neither are we. Dismissing or ignoring them will be considered "non-compliance" with medication therapy, resulting in complete and irreversible termination of such therapy. (See document titled "Medication Rules" for more details.) In all conscience,  because of safety reasons, we cannot continue providing a therapy where the patient does not follow instructions. ? ?Pharmacy of record:  ?Definition: This is the pharmacy where your electronic prescriptions w

## 2021-04-20 NOTE — Progress Notes (Signed)
Nursing Pain Medication Assessment:  ?Safety precautions to be maintained throughout the outpatient stay will include: orient to surroundings, keep bed in low position, maintain call bell within reach at all times, provide assistance with transfer out of bed and ambulation.  ?Medication Inspection Compliance: Pill count conducted under aseptic conditions, in front of the patient. Neither the pills nor the bottle was removed from the patient's sight at any time. Once count was completed pills were immediately returned to the patient in their original bottle. ? ?Medication: Oxycodone/APAP ?Pill/Patch Count:  61 of 120 pills remain ?Pill/Patch Appearance: Markings consistent with prescribed medication ?Bottle Appearance: Standard pharmacy container. Clearly labeled. ?Filled Date: 03 / 22 / 2023 ?Last Medication intake:  Today ?

## 2021-05-03 ENCOUNTER — Encounter: Payer: BC Managed Care – PPO | Admitting: Pain Medicine

## 2021-05-20 ENCOUNTER — Encounter: Payer: Self-pay | Admitting: Pain Medicine

## 2021-05-20 ENCOUNTER — Ambulatory Visit
Admission: RE | Admit: 2021-05-20 | Discharge: 2021-05-20 | Disposition: A | Discharge status: Home / Self Care | Payer: BC Managed Care – PPO | Source: Ambulatory Visit | Attending: Pain Medicine | Admitting: Pain Medicine

## 2021-05-20 ENCOUNTER — Ambulatory Visit (HOSPITAL_BASED_OUTPATIENT_CLINIC_OR_DEPARTMENT_OTHER): Payer: BC Managed Care – PPO | Admitting: Pain Medicine

## 2021-05-20 VITALS — BP 155/99 | HR 68 | Temp 97.4°F | Resp 17 | Ht 66.0 in | Wt 218.0 lb

## 2021-05-20 DIAGNOSIS — M47817 Spondylosis without myelopathy or radiculopathy, lumbosacral region: Secondary | ICD-10-CM | POA: Diagnosis present

## 2021-05-20 DIAGNOSIS — M5137 Other intervertebral disc degeneration, lumbosacral region: Secondary | ICD-10-CM

## 2021-05-20 DIAGNOSIS — G8929 Other chronic pain: Secondary | ICD-10-CM | POA: Insufficient documentation

## 2021-05-20 DIAGNOSIS — M545 Low back pain, unspecified: Secondary | ICD-10-CM

## 2021-05-20 DIAGNOSIS — M47816 Spondylosis without myelopathy or radiculopathy, lumbar region: Secondary | ICD-10-CM | POA: Diagnosis not present

## 2021-05-20 MED ORDER — LIDOCAINE HCL 2 % IJ SOLN
20.0000 mL | Freq: Once | INTRAMUSCULAR | Status: AC
  Administered 2021-05-20: 400 mg
  Filled 2021-05-20: qty 20

## 2021-05-20 MED ORDER — PENTAFLUOROPROP-TETRAFLUOROETH EX AERO
INHALATION_SPRAY | Freq: Once | CUTANEOUS | Status: AC
  Administered 2021-05-20: 30 via TOPICAL

## 2021-05-20 MED ORDER — ROPIVACAINE HCL 2 MG/ML IJ SOLN
9.0000 mL | Freq: Once | INTRAMUSCULAR | Status: AC
  Administered 2021-05-20: 9 mL via PERINEURAL
  Filled 2021-05-20: qty 20

## 2021-05-20 MED ORDER — MIDAZOLAM HCL 5 MG/5ML IJ SOLN: 0.5000 mg | Freq: Once | INTRAMUSCULAR | Status: DC

## 2021-05-20 MED ORDER — LACTATED RINGERS IV SOLN: 1000.0000 mL | Freq: Once | INTRAVENOUS | Status: DC

## 2021-05-20 MED ORDER — TRIAMCINOLONE ACETONIDE 40 MG/ML IJ SUSP
40.0000 mg | Freq: Once | INTRAMUSCULAR | Status: AC
  Administered 2021-05-20: 40 mg
  Filled 2021-05-20: qty 1

## 2021-05-20 NOTE — Progress Notes (Signed)
PROVIDER NOTE: Interpretation of information contained herein should be left to medically-trained personnel. Specific patient instructions are provided elsewhere under "Patient Instructions" section of medical record. This document was created in part using STT-dictation technology, any transcriptional errors that may result from this process are unintentional.  ?Patient: Adam Petty. ?Type: Established ?DOB: 05-10-1960 ?MRN: [id number] ?PCP: System, Provider Not In  Service: Procedure ?DOS: 05/20/2021 ?Setting: Ambulatory ?Location: Ambulatory outpatient facility ?Delivery: Face-to-face Provider: Oswaldo Done, MD ?Specialty: Interventional Pain Management ?Specialty designation: 09 ?Location: Outpatient facility ?Ref. Prov.: No ref. provider found   ? ?Primary Reason for Visit: Interventional Pain Management Treatment. ?CC: Back Pain (lower) ?  ?Procedure:          ? Type: Lumbar Facet, Medial Branch Block(s) #5  ?Laterality: Right  ?Level: L3, L4, L5, & S1 Medial Branch Level(s). Injecting these levels blocks the L4-5 and L5-S1 lumbar facet joints. ?Imaging: Fluoroscopic guidance ?Anesthesia: Local anesthesia (1-2% Lidocaine) ?Anxiolysis: None  ?Sedation: None. ?DOS: 05/20/2021 ?Performed by: Oswaldo Done, MD ? ?Primary Purpose: Diagnostic/Therapeutic ?Indications: Low back pain severe enough to impact quality of life or function. ?1. Lumbar facet syndrome (WC injury) (Bilateral) (R>L)   ?2. Spondylosis without myelopathy or radiculopathy, lumbosacral region   ?3. DDD (degenerative disc disease), lumbosacral   ?4. Chronic low back pain (WC injury) (1ry area of Pain) (Bilateral) (R>L) w/o sciatica   ? ?NAS-11 Pain score:  ? Pre-procedure: 4 /10  ? Post-procedure: 2 /10  ? ?  ?Position / Prep / Materials:  ?Position: Prone  ?Prep solution: DuraPrep (Iodine Povacrylex [0.7% available iodine] and Isopropyl Alcohol, 74% w/w) ?Area Prepped: Posterolateral Lumbosacral Spine (Wide prep: From the lower  border of the scapula down to the end of the tailbone and from flank to flank.) ? ?Materials:  ?Tray: Block ?Needle(s):  ?Type: Spinal  ?Gauge (G): 22  ?Length: 5-in ?Qty: 4 ? ?Pre-op H&P Assessment:  ?Mr. Mindel is a 61 y.o. (year old), male patient, seen today for interventional treatment. He  has a past surgical history that includes Tonsillectomy; Shoulder surgery; Palate surgery; Cervical fusion; Foot surgery; Lumbar laminectomy/decompression microdiscectomy (N/A, 02/06/2013); Knee arthroscopy (Right, 10/02/2013); Knee arthroscopy with medial menisectomy (Right, 03/25/2015); Knee bursectomy (Right, 03/25/2015); Cystoscopy w/ retrogrades (Right, 11/19/2015); Cystoscopy w/ retrogrades (Right, 10/03/2017); Ureteroscopy (Right, 10/03/2017); and CTR (Left, 02/27/2018). Mr. Herzberg has a current medication list which includes the following prescription(s): cholecalciferol, cyanocobalamin, meloxicam, metformin, methocarbamol, naproxen sodium, oxycodone-acetaminophen, [START ON 06/06/2021] oxycodone-acetaminophen, [START ON 07/06/2021] oxycodone-acetaminophen, ramipril, rosuvastatin, semaglutide(0.25 or 0.5mg /dos), gabapentin, insulin degludec, and novolog flexpen. His primarily concern today is the Back Pain (lower) ? ?Initial Vital Signs:  ?Pulse/HCG Rate: 70  ?Temp: (!) 97.4 ?F (36.3 ?C) ?Resp: 16 ?BP: 132/82 ?SpO2: 98 % ? ?BMI: Estimated body mass index is 35.19 kg/m? as calculated from the following: ?  Height as of this encounter: 5\' 6"  (1.676 m). ?  Weight as of this encounter: 218 lb (98.9 kg). ? ?Risk Assessment: ?Allergies: Reviewed. He has No Known Allergies.  ?Allergy Precautions: None required ?Coagulopathies: Reviewed. None identified.  ?Blood-thinner therapy: None at this time ?Active Infection(s): Reviewed. None identified. Mr. Gilardi is afebrile ? ?Site Confirmation: Mr. Pryer was asked to confirm the procedure and laterality before marking the site ?Procedure checklist: Completed ?Consent: Before the  procedure and under the influence of no sedative(s), amnesic(s), or anxiolytics, the patient was informed of the treatment options, risks and possible complications. To fulfill our ethical and legal obligations, as recommended by the American Medical  Association's Code of Ethics, I have informed the patient of my clinical impression; the nature and purpose of the treatment or procedure; the risks, benefits, and possible complications of the intervention; the alternatives, including doing nothing; the risk(s) and benefit(s) of the alternative treatment(s) or procedure(s); and the risk(s) and benefit(s) of doing nothing. ?The patient was provided information about the general risks and possible complications associated with the procedure. These may include, but are not limited to: failure to achieve desired goals, infection, bleeding, organ or nerve damage, allergic reactions, paralysis, and death. ?In addition, the patient was informed of those risks and complications associated to Spine-related procedures, such as failure to decrease pain; infection (i.e.: Meningitis, epidural or intraspinal abscess); bleeding (i.e.: epidural hematoma, subarachnoid hemorrhage, or any other type of intraspinal or peri-dural bleeding); organ or nerve damage (i.e.: Any type of peripheral nerve, nerve root, or spinal cord injury) with subsequent damage to sensory, motor, and/or autonomic systems, resulting in permanent pain, numbness, and/or weakness of one or several areas of the body; allergic reactions; (i.e.: anaphylactic reaction); and/or death. ?Furthermore, the patient was informed of those risks and complications associated with the medications. These include, but are not limited to: allergic reactions (i.e.: anaphylactic or anaphylactoid reaction(s)); adrenal axis suppression; blood sugar elevation that in diabetics may result in ketoacidosis or comma; water retention that in patients with history of congestive heart failure  may result in shortness of breath, pulmonary edema, and decompensation with resultant heart failure; weight gain; swelling or edema; medication-induced neural toxicity; particulate matter embolism and blood vessel occlusion with resultant organ, and/or nervous system infarction; and/or aseptic necrosis of one or more joints. ?Finally, the patient was informed that Medicine is not an exact science; therefore, there is also the possibility of unforeseen or unpredictable risks and/or possible complications that may result in a catastrophic outcome. The patient indicated having understood very clearly. We have given the patient no guarantees and we have made no promises. Enough time was given to the patient to ask questions, all of which were answered to the patient's satisfaction. Mr. Kauer has indicated that he wanted to continue with the procedure. ?Attestation: I, the ordering provider, attest that I have discussed with the patient the benefits, risks, side-effects, alternatives, likelihood of achieving goals, and potential problems during recovery for the procedure that I have provided informed consent. ?Date  Time: 05/20/2021  9:44 AM ? ?Pre-Procedure Preparation:  ?Monitoring: As per clinic protocol. Respiration, ETCO2, SpO2, BP, heart rate and rhythm monitor placed and checked for adequate function ?Safety Precautions: Patient was assessed for positional comfort and pressure points before starting the procedure. ?Time-out: I initiated and conducted the "Time-out" before starting the procedure, as per protocol. The patient was asked to participate by confirming the accuracy of the "Time Out" information. Verification of the correct person, site, and procedure were performed and confirmed by me, the nursing staff, and the patient. "Time-out" conducted as per Joint Commission's Universal Protocol (UP.01.01.01). ?Time: 1053 ? ?Description of Procedure:          ?Laterality: Right ?Targeted Levels:  L3, L4, L5, & S1  Medial Branch Level(s) ? ?Safety Precautions: Aspiration looking for blood return was conducted prior to all injections. At no point did we inject any substances, as a needle was being advanced. Before injecti

## 2021-05-20 NOTE — Patient Instructions (Signed)

## 2021-05-20 NOTE — Progress Notes (Signed)
Safety precautions to be maintained throughout the outpatient stay will include: orient to surroundings, keep bed in low position, maintain call bell within reach at all times, provide assistance with transfer out of bed and ambulation.  

## 2021-05-21 ENCOUNTER — Telehealth: Payer: Self-pay

## 2021-05-21 NOTE — Telephone Encounter (Signed)
Post procedure follow up. States his pain came back after 6 hours and is worse than it was prior.  States it has gotten a little better this morning.  States it is the same kind of pain as before procedure.  Informed patient that numbing agent has worn off and he should give the steroid time to start working.  Instruected to use heat today and to document on pain diary.  Instructed patient to go to ED if symptoms worsen or if he feels necessary.  ?

## 2021-05-31 DIAGNOSIS — M7061 Trochanteric bursitis, right hip: Secondary | ICD-10-CM | POA: Insufficient documentation

## 2021-05-31 DIAGNOSIS — M25521 Pain in right elbow: Secondary | ICD-10-CM | POA: Insufficient documentation

## 2021-06-14 NOTE — Progress Notes (Unsigned)
Patient: Adam Petty.  Service Category: E/M  Provider: Gaspar Cola, MD  DOB: [birth year]  DOS: 06/16/2021  Location: Office  MRN: [id number]  Setting: Ambulatory outpatient  Referring Provider: No ref. provider found  Type: Established Patient  Specialty: Interventional Pain Management  PCP: System, Provider Not In  Location: Remote location  Delivery: TeleHealth     Virtual Encounter - Pain Management PROVIDER NOTE: Information contained herein reflects review and annotations entered in association with encounter. Interpretation of such information and data should be left to medically-trained personnel. Information provided to patient can be located elsewhere in the medical record under "Patient Instructions". Document created using STT-dictation technology, any transcriptional errors that may result from process are unintentional.    Contact & Pharmacy Preferred: [phone] Home: [phone] (home) Mobile: [phone] (mobile) E-mail: [email or web address]  [facility] [address] [address] Phone: [phone] Fax: [facility] [address], [place] [address] Phone: [phone] Fax: [phone]   Pre-screening  [name] offered "in-person" vs "virtual" encounter. He indicated preferring virtual for this encounter.   Reason COVID-19*  Social distancing based on CDC and AMA recommendations.   I contacted Adam Petty. on 06/16/2021 via telephone.      I clearly identified myself as Gaspar Cola, MD. I verified that I was speaking with the correct person using two identifiers (Name: Adam Petty., and date of birth: [birth year]).  Consent I sought verbal advanced consent from Adam Petty. for virtual visit interactions. I informed Adam Petty of possible security and  privacy concerns, risks, and limitations associated with providing "not-in-person" medical evaluation and management services. I also informed [name] of the availability of "in-person" appointments. Finally, I informed him that there would be a charge for the virtual visit and that he could be  personally, fully or partially, financially responsible for it. Adam Petty expressed understanding and agreed to proceed.   Historic Elements   [name] is a 61 y.o. year old, male patient evaluated today after our last contact on 05/20/2021. 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 He also  has a past surgical history that includes Tonsillectomy; Shoulder surgery; Palate surgery; Cervical fusion; Foot surgery; Lumbar laminectomy/decompression microdiscectomy (N/A, 02/06/2013); Knee arthroscopy (Right, 10/02/2013); Knee arthroscopy with medial menisectomy (Right, 03/25/2015); Knee bursectomy (Right, 03/25/2015); Cystoscopy w/ retrogrades (Right, 11/19/2015); Cystoscopy w/ retrogrades (Right, 10/03/2017); Ureteroscopy (Right, 10/03/2017); and CTR (Left, 02/27/2018). Adam Petty has a current medication list which includes the following prescription(s): cholecalciferol, cyanocobalamin, gabapentin, meloxicam, metformin, methocarbamol, naproxen sodium, oxycodone-acetaminophen, [START ON 07/06/2021] oxycodone-acetaminophen, ramipril, and rosuvastatin. He  reports that he has never smoked. He has never used smokeless tobacco. He reports current alcohol use. He reports that he does not use drugs. Adam Petty has No Known Allergies.   HPI  Today, he is being contacted for a post-procedure assessment.  The patient indicates that although he did not get  100% relief of the pain for the duration of the local anesthetic, it did improve to about 80%.  However, he also refers that after the local anesthetic wore off then his pain again went  up significantly and it was not until the next day or so that it began to again go down.  Currently he describes having an ongoing 80% improvement in his low back pain.  At the time of this call, he has been working through the day and he refers that by this time of the day typically his back pain is pretty bad and he has had to go through multiple doses of ibuprofen and Tylenol in order to get it under control however, he refers that currently it is not really bothering him that much and he has not had to take any acetaminophen or ibuprofen.  He describes that at this point he is doing well and does not need anything else for the time being.  In an effort to try to figure out what caused some of that flareup after the local anesthetic wore out, I asked him if he had followed our instructions on using the ice in the heat.  He indicated that he used some cold compresses for about 20 minutes and then he switched to heat.  Today I took the time to explain to him the benefit of icing the area 15 minutes on and 15 minutes off for the first 24 to 48 hours and then follow-up with the heat.  I have recommended to him that next time he try that instead to see if that would help.  He acknowledged and agreed to do so.  RTCB: 07/26/2021  Post-procedure evaluation   Type: Lumbar Facet, Medial Branch Block(s) #5  Laterality: Right  Level: L3, L4, L5, & S1 Medial Branch Level(s). Injecting these levels blocks the L4-5 and L5-S1 lumbar facet joints. Imaging: Fluoroscopic guidance Anesthesia: Local anesthesia (1-2% Lidocaine) Anxiolysis: None  Sedation: None. DOS: 05/20/2021 Performed by: Gaspar Cola, MD  Primary Purpose: Diagnostic/Therapeutic Indications: Low back pain severe enough to impact quality of life or  function. 1. Lumbar facet syndrome (WC injury) (Bilateral) (R>L)   2. Spondylosis without myelopathy or radiculopathy, lumbosacral region   3. DDD (degenerative disc disease), lumbosacral   4. Chronic low back pain (WC injury) (1ry area of Pain) (Bilateral) (R>L) w/o sciatica    NAS-11 Pain score:   Pre-procedure: 4 /10   Post-procedure: 2 /10     Effectiveness:  Initial hour after procedure: 80 %. Subsequent 4-6 hours post-procedure: 80 %. Analgesia past initial 6 hours: 80 %. Ongoing improvement:  Analgesic: The patient refers having an ongoing 80% relief of his pain. Function: Adam Petty reports improvement in function ROM: Adam Petty reports improvement in ROM  Pharmacotherapy Assessment   Opioid Analgesic: Oxycodone IR 5 mg, 1 tab PO q 6 hrs (20 mg/day)  MME/day: 30 mg/day.   Monitoring: [place] PMP: PDMP reviewed during this encounter.       Pharmacotherapy: No side-effects or adverse reactions reported. Compliance: No problems identified. Effectiveness: Clinically acceptable. Plan: Refer to "POC". UDS:  Summary  Date Value Ref Range Status  08/05/2020 Note  Final    Comment:    ==================================================================== [employer] Select 13 (MW) ==================================================================== Test                             Result       Flag       Units  Drug Present and Declared for Prescription Verification   Oxycodone                      1383  EXPECTED   ng/mg creat   Oxymorphone                    1511         EXPECTED   ng/mg creat   Noroxycodone                   1033         EXPECTED   ng/mg creat   Noroxymorphone                 201          EXPECTED   ng/mg creat    Sources of oxycodone are scheduled prescription medications.    Oxymorphone, noroxycodone, and noroxymorphone are expected    metabolites of oxycodone. Oxymorphone is also available as a    scheduled prescription  medication.  ==================================================================== Test                      Result    Flag   Units      Ref Range   Creatinine              253              mg/dL      >=20 ==================================================================== Declared Medications:  The flagging and interpretation on this report are based on the  following declared medications.  Unexpected results may arise from  inaccuracies in the declared medications.   **Note: The testing scope of this panel includes these medications:   Oxycodone (Percocet)   **Note: The testing scope of this panel does not include the  following reported medications:   Acetaminophen (Percocet)  Albuterol (Ventolin HFA)  Doxycycline (Vibramycin)  Gabapentin (Neurontin)  Insulin (NovoLog)  Ketoconazole (Nizoral)  Metformin (Glucophage)  Naproxen (Aleve)  Potassium (Klor-Con)  Ramipril (Altace)  Rosuvastatin (Crestor)  Semaglutide  Testosterone  Turmeric  Vitamin B1 ==================================================================== For clinical consultation, please call [phone]. ====================================================================      Laboratory Chemistry Profile   Renal Lab Results  Component Value Date   BUN 17 12/27/2020   CREATININE 0.97 12/27/2020   GFRAA >60 07/01/2019   GFRNONAA >60 12/27/2020    Hepatic Lab Results  Component Value Date   AST 21 12/27/2020   ALT 24 12/27/2020   ALBUMIN 3.8 12/27/2020   ALKPHOS 82 12/27/2020    Electrolytes Lab Results  Component Value Date   NA 132 (L) 12/27/2020   K 4.2 12/27/2020   CL 100 12/27/2020   CALCIUM 9.0 12/27/2020   MG 1.8 03/12/2015    Bone No results found for: VD25OH, VD125OH2TOT, OH6073XT0, GY6948NI6, 25OHVITD1, 25OHVITD2, 25OHVITD3, TESTOFREE, TESTOSTERONE  Inflammation (CRP: Acute Phase) (ESR: Chronic Phase) Lab Results  Component Value Date   CRP 0.6 03/12/2015   ESRSEDRATE 9  03/12/2015         Note: Above Lab results reviewed.  Imaging  DG PAIN CLINIC C-ARM 1-60 MIN NO REPORT Fluoro was used, but no Radiologist interpretation will be provided.  Please refer to "NOTES" tab for provider progress note.  Assessment  The primary encounter diagnosis was Chronic pain syndrome. 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  Plan of Care  Problem-specific:  No problem-specific Assessment & Plan notes found for this encounter.  Mr. Mathius Birkeland. has a current medication list which includes the following long-term medication(s): gabapentin, oxycodone-acetaminophen, and [START ON 07/06/2021] oxycodone-acetaminophen.  Pharmacotherapy (Medications Ordered): No orders of the defined types were placed in this encounter.  Orders:  No orders of the defined types were placed in this encounter.  Follow-up plan:   No follow-ups on file.     Interventional Therapies  Risk  Complexity Considerations:   Estimated body mass index is 38.74 kg/m as calculated from the following:   Height as of this encounter: '5\' 6"'  (1.676 m).   Weight as of this encounter: 240 lb (108.9 kg). WNL   Planned  Pending:      Under consideration:   Possible right Suprascapular nerve RFA  Possible left lumbar facet RFA  Diagnostic right knee genicular NB    Completed:   Diagnostic right caudal ESI x1 + epidurogram #1 (10/02/2019)  Palliative right lumbar facet MBB x4 (04/02/2019)  Palliative left lumbar facet MBB x2  (12/16/2015)  Palliative right lumbar facet RFA x1  (Right: 04/13/2016) (100% relief)  Diagnostic/therapeutic bilateral SI joint block x2 (11/17/2016)  Diagnostic right Suprascapular NB x1 (06/22/2016)    Therapeutic  Palliative (PRN) options:   Palliative lumbar facet block  Palliative right lumbar facet RFA #2  Diagnostic Suprascapular nerve Block     Recent Visits Date Type Provider Dept  05/20/21 Procedure visit Milinda Pointer, MD [facility]  04/20/21 Office Visit Milinda Pointer, MD [facility]  03/29/21 Office Visit Milinda Pointer, MD [facility]  Showing recent visits within past 90 days and meeting all other requirements Today's Visits Date Type Provider Dept  06/16/21 Office Visit Milinda Pointer, MD [facility]  Showing today's visits and meeting all other requirements Future Appointments Date Type Provider Dept  07/26/21 Appointment Milinda Pointer, MD [facility]  Showing future appointments within next 90 days and meeting all other requirements  I discussed the assessment and treatment plan with the patient. The patient was provided an opportunity to ask questions and all were answered. The patient agreed with the plan and demonstrated an understanding of the instructions.  Patient advised to call back or seek an in-person evaluation if the symptoms or condition worsens.  Duration of encounter: 31 minutes.  Note by: Gaspar Cola, MD Date: 06/16/2021; Time: 4:26 PM

## 2021-06-16 ENCOUNTER — Ambulatory Visit: Payer: BC Managed Care – PPO | Attending: Pain Medicine | Admitting: Pain Medicine

## 2021-06-16 DIAGNOSIS — M5126 Other intervertebral disc displacement, lumbar region: Secondary | ICD-10-CM

## 2021-06-16 DIAGNOSIS — M5136 Other intervertebral disc degeneration, lumbar region with discogenic back pain only: Secondary | ICD-10-CM

## 2021-06-16 DIAGNOSIS — Z79891 Long term (current) use of opiate analgesic: Secondary | ICD-10-CM

## 2021-06-16 DIAGNOSIS — Z79899 Other long term (current) drug therapy: Secondary | ICD-10-CM

## 2021-06-16 DIAGNOSIS — G894 Chronic pain syndrome: Secondary | ICD-10-CM | POA: Diagnosis not present

## 2021-06-16 DIAGNOSIS — M961 Postlaminectomy syndrome, not elsewhere classified: Secondary | ICD-10-CM

## 2021-06-16 DIAGNOSIS — M545 Low back pain, unspecified: Secondary | ICD-10-CM

## 2021-06-16 DIAGNOSIS — M25561 Pain in right knee: Secondary | ICD-10-CM | POA: Diagnosis not present

## 2021-06-16 DIAGNOSIS — M79604 Pain in right leg: Secondary | ICD-10-CM

## 2021-06-16 DIAGNOSIS — M25511 Pain in right shoulder: Secondary | ICD-10-CM | POA: Diagnosis not present

## 2021-06-16 DIAGNOSIS — G8929 Other chronic pain: Secondary | ICD-10-CM

## 2021-07-25 NOTE — Progress Notes (Unsigned)
PROVIDER NOTE: Information contained herein reflects review and annotations entered in association with encounter. Interpretation of such information and data should be left to medically-trained personnel. Information provided to patient can be located elsewhere in the medical record under "Patient Instructions". Document created using STT-dictation technology, any transcriptional errors that may result from process are unintentional.    Patient: Adam Petty.  Service Category: E/M  Provider: Gaspar Cola, MD  DOB: 06/11/1960  DOS: 07/26/2021  Specialty: Interventional Pain Management  MRN: [id number]  Setting: Ambulatory outpatient  PCP: System, Provider Not In  Type: Established Patient    Referring Provider: No ref. provider found  Location: Office  Delivery: Face-to-face     HPI  [name]., a 61 y.o. year old male, is here today because of his Chronic pain syndrome [G89.4]. Mr. Heatherly primary complain today is No chief complaint on file. Last encounter: My last encounter with him was on 06/16/2021. Pertinent problems: Mr. 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); Discogenic syndrome, lumbar (WC injury); Osteoarthritis of spine with radiculopathy, lumbar region (WC injury); Lumbar facet syndrome (WC injury) (Bilateral) (R>L); Lumbosacral radiculopathy (WC injury); Lumbar canal stenosis; Neurogenic pain; Neuropathic pain; Musculoskeletal pain; Lumbar spondylosis; Chronic knee pain (2ry area of Pain) (WC injury) (Right); Osteoarthritis of knee (Right); Chronic shoulder pain (3ry area of Pain) (Right); Osteoarthritis of shoulder (Right); Chronic lower extremity pain (Right); Recurrent nephrolithiasis; Acute medial meniscal tear; Chronic pain syndrome (WC injury); Acute low back pain without sciatica; Osteoarthritis  of glenohumeral joint (Right); Chronic sacroiliac joint pain (Bilateral) (R>L); Spondylosis without myelopathy or radiculopathy, lumbosacral region; DDD (degenerative disc disease), lumbosacral; DDD (degenerative disc disease), thoracic; Lumbosacral radiculopathy (S1) (Right); Inflammatory spondylopathy of lumbosacral region [facility]); DDD (degenerative disc disease), cervical; Cervical facet syndrome; Cervicalgia; and Cervicogenic headache on their pertinent problem list. Pain Assessment: Severity of   is reported as a  /10. Location:    / . Onset:  . Quality:  . Timing:  . Modifying factor(s):  Marland Kitchen Vitals:  vitals were not taken for this visit.   Reason for encounter: medication management. ***  UDS ordered today.   RTCB: 11/03/2021 Nonopioids transfer 11/04/2019: Neurontin  Pharmacotherapy Assessment  Analgesic: Oxycodone IR 5 mg, 1 tab PO q 6 hrs (20 mg/day)  MME/day: 30 mg/day.   Monitoring: [place] PMP: PDMP reviewed during this encounter.       Pharmacotherapy: No side-effects or adverse reactions reported. Compliance: No problems identified. Effectiveness: Clinically acceptable.  No notes on file  UDS:  Summary  Date Value Ref Range Status  08/05/2020 Note  Final    Comment:    ==================================================================== [employer] Select 13 (MW) ==================================================================== Test                             Result       Flag       Units  Drug Present and Declared for Prescription Verification   Oxycodone                      1383         EXPECTED   ng/mg creat   Oxymorphone                    1511  EXPECTED   ng/mg creat   Noroxycodone                   1033         EXPECTED   ng/mg creat   Noroxymorphone                 201          EXPECTED   ng/mg creat    Sources of oxycodone are scheduled prescription medications.    Oxymorphone, noroxycodone, and noroxymorphone are expected    metabolites of oxycodone.  Oxymorphone is also available as a    scheduled prescription medication.  ==================================================================== Test                      Result    Flag   Units      Ref Range   Creatinine              253              mg/dL      >=20 ==================================================================== Declared Medications:  The flagging and interpretation on this report are based on the  following declared medications.  Unexpected results may arise from  inaccuracies in the declared medications.   **Note: The testing scope of this panel includes these medications:   Oxycodone (Percocet)   **Note: The testing scope of this panel does not include the  following reported medications:   Acetaminophen (Percocet)  Albuterol (Ventolin HFA)  Doxycycline (Vibramycin)  Gabapentin (Neurontin)  Insulin (NovoLog)  Ketoconazole (Nizoral)  Metformin (Glucophage)  Naproxen (Aleve)  Potassium (Klor-Con)  Ramipril (Altace)  Rosuvastatin (Crestor)  Semaglutide  Testosterone  Turmeric  Vitamin B1 ==================================================================== For clinical consultation, please call [phone]. ====================================================================      ROS  Constitutional: Denies any fever or chills Gastrointestinal: No reported hemesis, hematochezia, vomiting, or acute GI distress Musculoskeletal: Denies any acute onset joint swelling, redness, loss of ROM, or weakness Neurological: No reported episodes of acute onset apraxia, aphasia, dysarthria, agnosia, amnesia, paralysis, loss of coordination, or loss of consciousness  Medication Review  Cholecalciferol, cyanocobalamin, gabapentin, meloxicam, metFORMIN, methocarbamol, naproxen sodium, oxyCODONE-acetaminophen, ramipril, and rosuvastatin  History Review  Allergy: Adam Petty has No Known Allergies. Drug: Adam Petty  reports no history of drug use. Alcohol:   reports current alcohol use. Tobacco:  reports that he has never smoked. He has never used smokeless tobacco. Social: Adam Petty  reports that he has never smoked. He has never used smokeless tobacco. He reports current alcohol use. He reports that he does not use drugs. 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 Surgical: Mr. Orsak  has a past surgical history that includes Tonsillectomy; Shoulder surgery; Palate surgery; Cervical fusion; Foot surgery; Lumbar laminectomy/decompression microdiscectomy (N/A, 02/06/2013); Knee arthroscopy (Right, 10/02/2013); Knee arthroscopy with medial menisectomy (Right, 03/25/2015); Knee bursectomy (Right, 03/25/2015); Cystoscopy w/ retrogrades (Right, 11/19/2015); Cystoscopy w/ retrogrades (Right, 10/03/2017); Ureteroscopy (Right, 10/03/2017); and CTR (Left, 02/27/2018). Family: family history includes COPD in his mother; Cancer in his mother; Diabetes in his father; Heart disease in his father and mother.  Laboratory Chemistry Profile   Renal Lab Results  Component Value Date   BUN 17 12/27/2020   CREATININE 0.97 12/27/2020   GFRAA >60 07/01/2019   GFRNONAA >  60 12/27/2020    Hepatic Lab Results  Component Value Date   AST 21 12/27/2020   ALT 24 12/27/2020   ALBUMIN 3.8 12/27/2020   ALKPHOS 82 12/27/2020    Electrolytes Lab Results  Component Value Date   NA 132 (L) 12/27/2020   K 4.2 12/27/2020   CL 100 12/27/2020   CALCIUM 9.0 12/27/2020   MG 1.8 03/12/2015    Bone No results found for: "VD25OH", "VD125OH2TOT", "JJ0093GH8", "EX9371IR6", "25OHVITD1", "25OHVITD2", "25OHVITD3", "TESTOFREE", "TESTOSTERONE"  Inflammation (CRP: Acute Phase) (ESR: Chronic Phase) Lab Results  Component Value Date    CRP 0.6 03/12/2015   ESRSEDRATE 9 03/12/2015         Note: Above Lab results reviewed.  Recent Imaging Review  DG PAIN CLINIC C-ARM 1-60 MIN NO REPORT Fluoro was used, but no Radiologist interpretation will be provided.  Please refer to "NOTES" tab for provider progress note. Note: Reviewed        Physical Exam  General appearance: Well nourished, well developed, and well hydrated. In no apparent acute distress Mental status: Alert, oriented x 3 (person, place, & time)       Respiratory: No evidence of acute respiratory distress Eyes: PERLA Vitals: There were no vitals taken for this visit. BMI: Estimated body mass index is 35.19 kg/m as calculated from the following:   Height as of 05/20/21: '5\' 6"'  (1.676 m).   Weight as of 05/20/21: 218 lb (98.9 kg). Ideal: Patient weight not recorded  Assessment   Diagnosis Status  1. Chronic pain syndrome   2. Chronic low back pain (WC injury) (1ry area of Pain) (Bilateral) (R>L) w/o sciatica   3. Chronic knee pain (2ry area of Pain) (WC injury) (Right)   4. Chronic shoulder pain (3ry area of Pain) (Right)   5. Chronic lower extremity pain (Right)   6. Discogenic syndrome, lumbar (WC injury)   7. 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 Pharmacologic therapy   9. Chronic use of opiate for therapeutic purpose   10. Encounter for medication management   11. Encounter for chronic pain management    Controlled Controlled Controlled   Updated Problems: No problems updated.  Plan of Care  Problem-specific:  No problem-specific Assessment & Plan notes found for this encounter.  Mr. Ilario Dhaliwal. has a current medication list which includes the following long-term medication(s): gabapentin and oxycodone-acetaminophen.  Pharmacotherapy (Medications Ordered): No orders of the defined types were placed in this encounter.  Orders:  No orders of the defined types were placed in this encounter.  Follow-up plan:   No  follow-ups on file.     Interventional Therapies  Risk  Complexity Considerations:   Estimated body mass index is 38.74 kg/m as calculated from the following:   Height as of this encounter: '5\' 6"'  (1.676 m).   Weight as of this encounter: 240 lb (108.9 kg). WNL   Planned  Pending:      Under consideration:   Possible right Suprascapular nerve RFA  Possible left lumbar facet RFA  Diagnostic right knee genicular NB    Completed:   Diagnostic right caudal ESI x1 + epidurogram #1 (10/02/2019)  Palliative right lumbar facet MBB x4 (04/02/2019)  Palliative left lumbar facet MBB x2 (12/16/2015)  Palliative right lumbar facet RFA x1  (Right: 04/13/2016) (100% relief)  Diagnostic/therapeutic bilateral SI joint block x2 (11/17/2016)  Diagnostic right Suprascapular NB x1 (06/22/2016)    Therapeutic  Palliative (PRN) options:   Palliative  lumbar facet block  Palliative right lumbar facet RFA #2  Diagnostic Suprascapular nerve Block      Recent Visits Date Type Provider Dept  06/16/21 Office Visit Milinda Pointer, MD [facility]  05/20/21 Procedure visit Milinda Pointer, MD [facility]  Showing recent visits within past 90 days and meeting all other requirements Future Appointments Date Type Provider Dept  07/26/21 Appointment Milinda Pointer, MD [facility]  Showing future appointments within next 90 days and meeting all other requirements  I discussed the assessment and treatment plan with the patient. The patient was provided an opportunity to ask questions and all were answered. The patient agreed with the plan and demonstrated an understanding of the instructions.  Patient advised to call back or seek an in-person evaluation if the symptoms or condition worsens.  Duration of encounter: *** minutes.  Total time on encounter, as per AMA guidelines included both the face-to-face and non-face-to-face time personally spent by the physician and/or  other qualified health care professional(s) on the day of the encounter (includes time in activities that require the physician or other qualified health care professional and does not include time in activities normally performed by clinical staff). Physician's time may include the following activities when performed: preparing to see the patient (eg, review of tests, pre-charting review of records) obtaining and/or reviewing separately obtained history performing a medically appropriate examination and/or evaluation counseling and educating the patient/family/caregiver ordering medications, tests, or procedures referring and communicating with other health care professionals (when not separately reported) documenting clinical information in the electronic or other health record independently interpreting results (not separately reported) and communicating results to the patient/ family/caregiver care coordination (not separately reported)  Note by: Gaspar Cola, MD Date: 07/26/2021; Time: 4:29 PM

## 2021-07-26 ENCOUNTER — Other Ambulatory Visit: Payer: Self-pay

## 2021-07-26 ENCOUNTER — Ambulatory Visit: Payer: BC Managed Care – PPO | Attending: Pain Medicine | Admitting: Pain Medicine

## 2021-07-26 ENCOUNTER — Encounter: Payer: Self-pay | Admitting: Pain Medicine

## 2021-07-26 VITALS — BP 130/75 | HR 68 | Temp 97.5°F | Resp 16 | Ht 66.0 in | Wt 214.0 lb

## 2021-07-26 DIAGNOSIS — M25561 Pain in right knee: Secondary | ICD-10-CM | POA: Diagnosis present

## 2021-07-26 DIAGNOSIS — G8929 Other chronic pain: Secondary | ICD-10-CM | POA: Diagnosis present

## 2021-07-26 DIAGNOSIS — Z79891 Long term (current) use of opiate analgesic: Secondary | ICD-10-CM | POA: Insufficient documentation

## 2021-07-26 DIAGNOSIS — M545 Low back pain, unspecified: Secondary | ICD-10-CM | POA: Diagnosis present

## 2021-07-26 DIAGNOSIS — M25511 Pain in right shoulder: Secondary | ICD-10-CM | POA: Insufficient documentation

## 2021-07-26 DIAGNOSIS — T402X5A Adverse effect of other opioids, initial encounter: Secondary | ICD-10-CM | POA: Diagnosis present

## 2021-07-26 DIAGNOSIS — M79604 Pain in right leg: Secondary | ICD-10-CM | POA: Insufficient documentation

## 2021-07-26 DIAGNOSIS — G894 Chronic pain syndrome: Secondary | ICD-10-CM | POA: Insufficient documentation

## 2021-07-26 DIAGNOSIS — K5903 Drug induced constipation: Secondary | ICD-10-CM | POA: Insufficient documentation

## 2021-07-26 DIAGNOSIS — M961 Postlaminectomy syndrome, not elsewhere classified: Secondary | ICD-10-CM | POA: Insufficient documentation

## 2021-07-26 DIAGNOSIS — M5126 Other intervertebral disc displacement, lumbar region: Secondary | ICD-10-CM | POA: Diagnosis present

## 2021-07-26 DIAGNOSIS — Z79899 Other long term (current) drug therapy: Secondary | ICD-10-CM | POA: Diagnosis present

## 2021-07-26 MED ORDER — OXYCODONE-ACETAMINOPHEN 5-325 MG PO TABS: 1.0000 | ORAL_TABLET | Freq: Four times a day (QID) | ORAL | 0 refills | Status: DC | PRN

## 2021-07-26 MED ORDER — DOCUSATE SODIUM 100 MG PO CAPS: 100.0000 mg | ORAL_CAPSULE | Freq: Every evening | ORAL | 2 refills | Status: AC | PRN

## 2021-07-26 MED ORDER — BENEFIBER PO POWD: 6.0000 g | Freq: Three times a day (TID) | ORAL | 1 refills | Status: AC

## 2021-07-26 MED ORDER — BISACODYL 5 MG PO TBEC: 10.0000 mg | DELAYED_RELEASE_TABLET | Freq: Every evening | ORAL | 2 refills | Status: AC | PRN

## 2021-07-26 NOTE — Patient Instructions (Signed)

## 2021-07-26 NOTE — Progress Notes (Signed)
Nursing Pain Medication Assessment:  Safety precautions to be maintained throughout the outpatient stay will include: orient to surroundings, keep bed in low position, maintain call bell within reach at all times, provide assistance with transfer out of bed and ambulation.  Medication Inspection Compliance: Pill count conducted under aseptic conditions, in front of the patient. Neither the pills nor the bottle was removed from the patient's sight at any time. Once count was completed pills were immediately returned to the patient in their original bottle.  Medication: Oxycodone/APAP Pill/Patch Count:  50 of 120 pills remain Pill/Patch Appearance: Markings consistent with prescribed medication Bottle Appearance: Standard pharmacy container. Clearly labeled. Filled Date: 06 / 21 / 2023 Last Medication intake:  Today

## 2021-07-28 LAB — TOXASSURE SELECT 13 (MW), URINE

## 2021-10-24 NOTE — Progress Notes (Signed)
PROVIDER NOTE: Information contained herein reflects review and annotations entered in association with encounter. Interpretation of such information and data should be left to medically-trained personnel. Information provided to patient can be located elsewhere in the medical record under "Patient Instructions". Document created using STT-dictation technology, any transcriptional errors that may result from process are unintentional.    Patient: Adam Petty.  Service Category: E/M  Provider: Oswaldo Done, MD  DOB: 24-Sep-1960  DOS: 10/25/2021  Referring Provider: No ref. provider found  MRN: [id number]  Specialty: Interventional Pain Management  PCP: System, Provider Not In  Type: Established Patient  Setting: Ambulatory outpatient    Location: Office  Delivery: Face-to-face     HPI  Mr. Joyner Folk., a 61 y.o. year old male, is here today because of his Chronic pain syndrome [G89.4]. Mr. Miro primary complain today is Back Pain (R>L) Last encounter: My last encounter with him was on 07/26/2021. Pertinent problems: Mr. 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); Discogenic syndrome, lumbar (WC injury); Osteoarthritis of spine with radiculopathy, lumbar region (WC injury); Lumbar facet syndrome (WC injury) (Bilateral) (R>L); Lumbosacral radiculopathy (WC injury); Lumbar canal stenosis; Neurogenic pain; Neuropathic pain; Musculoskeletal pain; Lumbar spondylosis; Chronic knee pain (2ry area of Pain) (WC injury) (Right); Osteoarthritis of knee (Right); Chronic shoulder pain (3ry area of Pain) (Right); Osteoarthritis of shoulder (Right); Chronic lower extremity pain (Right); Recurrent nephrolithiasis; Acute medial meniscal tear; Chronic pain syndrome (WC injury); Acute low back pain without sciatica; Osteoarthritis of  glenohumeral joint (Right); Chronic sacroiliac joint pain (Bilateral) (R>L); Spondylosis without myelopathy or radiculopathy, lumbosacral region; DDD (degenerative disc disease), lumbosacral; DDD (degenerative disc disease), thoracic; Lumbosacral radiculopathy (S1) (Right); Inflammatory spondylopathy of lumbosacral region [facility]); DDD (degenerative disc disease), cervical; Cervical facet syndrome; Cervicalgia; Cervicogenic headache; Elbow pain (Right); and Greater trochanteric bursitis of right hip on their pertinent problem list. Pain Assessment: Severity of Chronic pain is reported as a 5 /10. Location: Back Lower, Right, Left (R>L)/Radaites from right lower back to a bit past the right knee. Onset: More than a month ago. Quality: Constant, Stabbing. Timing: Constant. Modifying factor(s): Heat. Vitals:  height is 5\' 6"  (1.676 m) and weight is 220 lb (99.8 kg). His temporal temperature is 97.7 F (36.5 C). His blood pressure is 138/83 and his pulse is 74. His respiration is 18 and oxygen saturation is 98%.   Reason for encounter: medication management.  The patient indicates doing well with the current medication regimen. No adverse reactions or side effects reported to the medications.  The patient comes into the clinic today indicating that he has some pain in the area of the right elbow.  When asked to put his finger exactly where he hurts, he located the pain in the ulnar groove of the elbow.  Exam was negative for Tinel's sign.  He denies any distal pain but he does describe difficulty rotating his forearm when he is handling a screwdriver.  He indicates that he was diagnosed by orthopedics as having a "tennis elbow".  However, the location of the pain does not seem to be over the lateral epicondyles.  He refers having had physical therapy by orthopedics, which did not help his pain.  He also indicates having had a brace for his elbow and his hand, neither of which helped.  They have offered him surgery,  but he wants to stay away from that.  He has requested that we try some of our interventional therapies to see if we can help with this pain.  I will go ahead and schedule him to return for a local anesthetic and steroid injection into the area.  The plan was shared with the patient who understood and accepted.  RTCB: 02/01/2022 Nonopioids transfer 11/04/2019: Neurontin  Pharmacotherapy Assessment  Analgesic: Oxycodone IR 5 mg, 1 tab PO q 6 hrs (20 mg/day)  MME/day: 30 mg/day.   Monitoring: [place] PMP: PDMP reviewed during this encounter.       Pharmacotherapy: No side-effects or adverse reactions reported. Compliance: No problems identified. Effectiveness: Clinically acceptable.  Earlyne Iba, RN  10/25/2021  8:19 AM  Sign when Signing Visit Safety precautions to be maintained throughout the outpatient stay will include: orient to surroundings, keep bed in low position, maintain call bell within reach at all times, provide assistance with transfer out of bed and ambulation.   Nursing Pain Medication Assessment:  Safety precautions to be maintained throughout the outpatient stay will include: orient to surroundings, keep bed in low position, maintain call bell within reach at all times, provide assistance with transfer out of bed and ambulation.  Medication Inspection Compliance: Pill count conducted under aseptic conditions, in front of the patient. Neither the pills nor the bottle was removed from the patient's sight at any time. Once count was completed pills were immediately returned to the patient in their original bottle.  Medication: Oxycodone/APAP Pill/Patch Count:  46 of 120 pills remain Pill/Patch Appearance: Markings consistent with prescribed medication Bottle Appearance: Standard pharmacy container. Clearly labeled. Filled Date: 09 / 21 / 2023 Last Medication intake:  Today    No results found for: "CBDTHCR" No results found for: "D8THCCBX" No results found for: "D9THCCBX"   UDS:  Summary  Date Value Ref Range Status  07/26/2021 Note  Final    Comment:    ==================================================================== [employer] Select 13 (MW) ==================================================================== Test                             Result       Flag       Units  Drug Present and Declared for Prescription Verification   Oxycodone                      2655         EXPECTED   ng/mg creat   Oxymorphone                    1235         EXPECTED   ng/mg creat   Noroxycodone                   2413         EXPECTED   ng/mg creat   Noroxymorphone                 438          EXPECTED   ng/mg creat    Sources of oxycodone are scheduled prescription medications.    Oxymorphone, noroxycodone, and noroxymorphone are expected    metabolites of oxycodone. Oxymorphone is also available as a    scheduled prescription medication.  ==================================================================== Test                      Result  Flag   Units      Ref Range   Creatinine              280              mg/dL      >=04 ==================================================================== Declared Medications:  The flagging and interpretation on this report are based on the  following declared medications.  Unexpected results may arise from  inaccuracies in the declared medications.   **Note: The testing scope of this panel includes these medications:   Oxycodone (Percocet)   **Note: The testing scope of this panel does not include the  following reported medications:   Acetaminophen (Percocet)  Bisacodyl  Cholecalciferol  Docusate (Colace)  Gabapentin (Neurontin)  Meloxicam (Mobic)  Metformin (Glucophage)  Naproxen (Aleve)  Ramipril (Altace)  Rosuvastatin (Crestor)  Supplement (Fiber)  Vitamin B12 ==================================================================== For clinical consultation, please call [phone]. ====================================================================       ROS  Constitutional: Denies any fever or chills Gastrointestinal: No reported hemesis, hematochezia, vomiting, or acute GI distress Musculoskeletal: Denies any acute onset joint swelling, redness, loss of ROM, or weakness Neurological: No reported episodes of acute onset apraxia, aphasia, dysarthria, agnosia, amnesia, paralysis, loss of coordination, or loss of consciousness  Medication Review  Cholecalciferol, Semaglutide(0.25 or 0.5MG /DOS), cyanocobalamin, gabapentin, meloxicam, metFORMIN, naloxone, naproxen sodium, oxyCODONE-acetaminophen, ramipril, and rosuvastatin  History Review  Allergy: Mr. Lyga has No Known Allergies. Drug: Mr. Lupi  reports no history of drug use. Alcohol:  reports current alcohol use. Tobacco:  reports that he has never smoked. He has never used smokeless tobacco. Social: Mr. Lefebure  reports that he has never smoked. He has never used smokeless tobacco. He reports current alcohol use. He reports that he does not use drugs. 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 Surgical: Mr. Motte  has a past surgical history that includes Tonsillectomy; Shoulder surgery; Palate surgery; Cervical fusion; Foot surgery; Lumbar laminectomy/decompression microdiscectomy (N/A, 02/06/2013); Knee arthroscopy (Right, 10/02/2013); Knee arthroscopy with medial menisectomy (Right, 03/25/2015); Knee bursectomy (Right, 03/25/2015); Cystoscopy w/ retrogrades (Right, 11/19/2015); Cystoscopy w/ retrogrades (Right, 10/03/2017); Ureteroscopy (Right, 10/03/2017); and CTR (Left, 02/27/2018). Family: family history includes COPD in his mother; Cancer in his  mother; Diabetes in his father; Heart disease in his father and mother.  Laboratory Chemistry Profile   Renal Lab Results  Component Value Date   BUN 17 12/27/2020   CREATININE 0.97 12/27/2020   GFRAA >60 07/01/2019   GFRNONAA >60 12/27/2020    Hepatic Lab Results  Component Value Date   AST 21 12/27/2020   ALT 24 12/27/2020   ALBUMIN 3.8 12/27/2020   ALKPHOS 82 12/27/2020    Electrolytes Lab Results  Component Value Date   NA 132 (L) 12/27/2020   K 4.2 12/27/2020   CL 100 12/27/2020   CALCIUM 9.0 12/27/2020   MG 1.8 03/12/2015    Bone No results found for: "VD25OH", "VD125OH2TOT", "BJ4782NF6", "OZ3086VH8", "25OHVITD1", "25OHVITD2", "25OHVITD3", "TESTOFREE", "TESTOSTERONE"  Inflammation (CRP: Acute Phase) (ESR: Chronic Phase) Lab Results  Component Value Date   CRP 0.6 03/12/2015   ESRSEDRATE 9 03/12/2015         Note: Above Lab results reviewed.  Recent Imaging Review  DG PAIN CLINIC C-ARM 1-60 MIN NO REPORT Fluoro was used, but no Radiologist interpretation will be  provided.  Please refer to "NOTES" tab for provider progress note. Note: Reviewed        Physical Exam  General appearance: Well nourished, well developed, and well hydrated. In no apparent acute distress Mental status: Alert, oriented x 3 (person, place, & time)       Respiratory: No evidence of acute respiratory distress Eyes: PERLA Vitals: BP 138/83   Pulse 74   Temp 97.7 F (36.5 C) (Temporal)   Resp 18   Ht 5\' 6"  (1.676 m)   Wt 220 lb (99.8 kg)   SpO2 98%   BMI 35.51 kg/m  BMI: Estimated body mass index is 35.51 kg/m as calculated from the following:   Height as of this encounter: 5\' 6"  (1.676 m).   Weight as of this encounter: 220 lb (99.8 kg). Ideal: Ideal body weight: 63.8 kg (140 lb 10.5 oz) Adjusted ideal body weight: 78.2 kg (172 lb 6.3 oz)  Assessment   Diagnosis Status  1. Chronic pain syndrome   2. Elbow pain (Right)   3. Chronic low back pain (WC injury) (1ry area of  Pain) (Bilateral) (R>L) w/o sciatica   4. Chronic knee pain (2ry area of Pain) (WC injury) (Right)   5. Chronic shoulder pain (3ry area of Pain) (Right)   6. Chronic lower extremity pain (Right)   7. Discogenic syndrome, lumbar (WC injury)   8. 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 Pharmacologic therapy   10. Chronic use of opiate for therapeutic purpose   11. Encounter for medication management   12. Encounter for chronic pain management    Controlled Controlled Controlled   Updated Problems: Problem  Elbow pain (Right)  Greater Trochanteric Bursitis of Right Hip     Plan of Care  Problem-specific:  No problem-specific Assessment & Plan notes found for this encounter.  Mr. Kailash Glassburn. has a current medication list which includes the following long-term medication(s): gabapentin, [START ON 11/03/2021] oxycodone-acetaminophen, [START ON 12/03/2021] oxycodone-acetaminophen, and [START ON 01/02/2022] oxycodone-acetaminophen.  Pharmacotherapy (Medications Ordered): Meds ordered this encounter  Medications   oxyCODONE-acetaminophen (PERCOCET) 5-325 MG tablet    Sig: Take 1 tablet by mouth every 6 (six) hours as needed for severe pain. Must last 30 days.    Dispense:  120 tablet    Refill:  0    DO NOT: delete (not duplicate); no partial-fill (will deny script to complete), no refill request (F/U required). DISPENSE: 1 day early if closed on fill date. WARN: No CNS-depressants within 8 hrs of med.   oxyCODONE-acetaminophen (PERCOCET) 5-325 MG tablet    Sig: Take 1 tablet by mouth every 6 (six) hours as needed for severe pain. Must last 30 days.    Dispense:  120 tablet    Refill:  0    DO NOT: delete (not duplicate); no partial-fill (will deny script to complete), no refill request (F/U required). DISPENSE: 1 day early if closed on fill date. WARN: No CNS-depressants within 8 hrs of med.   oxyCODONE-acetaminophen (PERCOCET) 5-325 MG tablet    Sig: Take 1  tablet by mouth every 6 (six) hours as needed for severe pain. Must last 30 days.    Dispense:  120 tablet    Refill:  0    DO NOT: delete (not duplicate); no partial-fill (will deny script to complete), no refill request (F/U required). DISPENSE: 1 day early if closed on fill date. WARN: No CNS-depressants within 8 hrs of med.   naloxone (  NARCAN) nasal spray 4 mg/0.1 mL    Sig: Place 1 spray into the nose as needed for up to 365 doses (for opioid-induced respiratory depresssion). In case of emergency (overdose), spray once into each nostril. If no response within 3 minutes, repeat application and call 911.    Dispense:  1 each    Refill:  0    Instruct patient in proper use of device.   Orders:  Orders Placed This Encounter  Procedures   Medium Joint Injection/Arthrocentesis    Level(s): Right elbow injection #1 (ulnar canal) Laterality: Right Sedation: No Sedation Purpose: Diagnostic Indication(s): Sub-acute pain    Standing Status:   Future    Standing Expiration Date:   01/25/2022    Scheduling Instructions:     Requested Scheduling Timeframe: As soon as pre-approved   Follow-up plan:   Return for procedure [facility]): (R) Elbow inj. #1.     Interventional Therapies  Risk  Complexity Considerations:   Estimated body mass index is 38.74 kg/m as calculated from the following:   Height as of this encounter: 5\' 6"  (1.676 m).   Weight as of this encounter: 240 lb (108.9 kg). WNL   Planned  Pending:      Under consideration:   Possible right Suprascapular nerve RFA  Possible left lumbar facet RFA  Diagnostic right knee genicular NB    Completed:   Diagnostic right caudal ESI x1 + epidurogram #1 (10/02/2019)  Palliative right lumbar facet MBB x4 (04/02/2019)  Palliative left lumbar facet MBB x2 (12/16/2015)  Palliative right lumbar facet RFA x1  (Right: 04/13/2016) (100% relief)  Diagnostic/therapeutic bilateral SI joint block x2 (11/17/2016)  Diagnostic right Suprascapular  NB x1 (06/22/2016)    Therapeutic  Palliative (PRN) options:   Palliative lumbar facet block  Palliative right lumbar facet RFA #2  Diagnostic Suprascapular nerve Block       Recent Visits No visits were found meeting these conditions. Showing recent visits within past 90 days and meeting all other requirements Today's Visits Date Type Provider Dept  10/25/21 Office Visit Delano Metz, MD [facility]  Showing today's visits and meeting all other requirements Future Appointments No visits were found meeting these conditions. Showing future appointments within next 90 days and meeting all other requirements  I discussed the assessment and treatment plan with the patient. The patient was provided an opportunity to ask questions and all were answered. The patient agreed with the plan and demonstrated an understanding of the instructions.  Patient advised to call back or seek an in-person evaluation if the symptoms or condition worsens.  Duration of encounter: 30 minutes.  Total time on encounter, as per AMA guidelines included both the face-to-face and non-face-to-face time personally spent by the physician and/or other qualified health care professional(s) on the day of the encounter (includes time in activities that require the physician or other qualified health care professional and does not include time in activities normally performed by clinical staff). Physician's time may include the following activities when performed: preparing to see the patient (eg, review of tests, pre-charting review of records) obtaining and/or reviewing separately obtained history performing a medically appropriate examination and/or evaluation counseling and educating the patient/family/caregiver ordering medications, tests, or procedures referring and communicating with other health care professionals (when not separately reported) documenting clinical information in the electronic or  other health record independently interpreting results (not separately reported) and communicating results to the patient/ family/caregiver care coordination (not separately reported)  Note by: Sydnee Levans  Laban Emperor, MD Date: 10/25/2021; Time: 8:42 AM

## 2021-10-25 ENCOUNTER — Encounter: Payer: Self-pay | Admitting: Pain Medicine

## 2021-10-25 ENCOUNTER — Ambulatory Visit: Payer: BC Managed Care – PPO | Attending: Pain Medicine | Admitting: Pain Medicine

## 2021-10-25 VITALS — BP 138/83 | HR 74 | Temp 97.7°F | Resp 18 | Ht 66.0 in | Wt 220.0 lb

## 2021-10-25 DIAGNOSIS — M25561 Pain in right knee: Secondary | ICD-10-CM | POA: Diagnosis present

## 2021-10-25 DIAGNOSIS — M5126 Other intervertebral disc displacement, lumbar region: Secondary | ICD-10-CM

## 2021-10-25 DIAGNOSIS — G8929 Other chronic pain: Secondary | ICD-10-CM

## 2021-10-25 DIAGNOSIS — Z79891 Long term (current) use of opiate analgesic: Secondary | ICD-10-CM | POA: Diagnosis present

## 2021-10-25 DIAGNOSIS — M961 Postlaminectomy syndrome, not elsewhere classified: Secondary | ICD-10-CM | POA: Diagnosis present

## 2021-10-25 DIAGNOSIS — Z79899 Other long term (current) drug therapy: Secondary | ICD-10-CM | POA: Diagnosis present

## 2021-10-25 DIAGNOSIS — G894 Chronic pain syndrome: Secondary | ICD-10-CM | POA: Diagnosis present

## 2021-10-25 DIAGNOSIS — M79604 Pain in right leg: Secondary | ICD-10-CM | POA: Diagnosis present

## 2021-10-25 DIAGNOSIS — M545 Low back pain, unspecified: Secondary | ICD-10-CM

## 2021-10-25 DIAGNOSIS — M5136 Other intervertebral disc degeneration, lumbar region with discogenic back pain only: Secondary | ICD-10-CM

## 2021-10-25 DIAGNOSIS — M25511 Pain in right shoulder: Secondary | ICD-10-CM | POA: Diagnosis present

## 2021-10-25 DIAGNOSIS — M25521 Pain in right elbow: Secondary | ICD-10-CM | POA: Diagnosis present

## 2021-10-25 MED ORDER — OXYCODONE-ACETAMINOPHEN 5-325 MG PO TABS: 1.0000 | ORAL_TABLET | Freq: Four times a day (QID) | ORAL | 0 refills | Status: DC | PRN

## 2021-10-25 MED ORDER — NALOXONE HCL 4 MG/0.1ML NA LIQD: 1.0000 | NASAL | 0 refills | Status: DC | PRN

## 2021-10-25 NOTE — Patient Instructions (Signed)
Naloxone Nasal Spray What is this medication? NALOXONE (nal OX one) treats opioid overdose, which causes slow or shallow breathing, severe drowsiness, or trouble staying awake. Call emergency services after using this medication. You may need additional treatment. Naloxone works by reversing the effects of opioids. It belongs to a group of medications called opioid blockers. This medicine may be used for other purposes; ask your health care provider or pharmacist if you have questions. COMMON BRAND NAME(S): Kloxxado, Narcan What should I tell my care team before I take this medication? They need to know if you have any of these conditions: Heart disease Substance use disorder An unusual or allergic reaction to naloxone, other medications, foods, dyes, or preservatives Pregnant or trying to get pregnant Breast-feeding How should I use this medication? This medication is for use in the nose. Lay the person on their back. Support their neck with your hand and allow the head to tilt back before giving the medication. The nasal spray should be given into 1 nostril. After giving the medication, move the person onto their side. Do not remove or test the nasal spray until ready to use. Get emergency medical help right away after giving the first dose of this medication, even if the person wakes up. You should be familiar with how to recognize the signs and symptoms of a narcotic overdose. If more doses are needed, give the additional dose in the other nostril. Talk to your care team about the use of this medication in children. While this medication may be prescribed for children as young as newborns for selected conditions, precautions do apply. Overdosage: If you think you have taken too much of this medicine contact a poison control center or emergency room at once. NOTE: This medicine is only for you. Do not share this medicine with others. What if I miss a dose? This does not apply. What may  interact with this medication? This is only used during an emergency. No interactions are expected during emergency use. This list may not describe all possible interactions. Give your health care provider a list of all the medicines, herbs, non-prescription drugs, or dietary supplements you use. Also tell them if you smoke, drink alcohol, or use illegal drugs. Some items may interact with your medicine. What should I watch for while using this medication? Keep this medication ready for use in the case of an opioid overdose. Make sure that you have the phone number of your care team and local hospital ready. You may need to have additional doses of this medication. Each nasal spray contains a single dose. Some emergencies may require additional doses. After use, bring the treated person to the nearest hospital or call 911. Make sure the treating care team knows that the person has received a dose of this medication. You will receive additional instructions on what to do during and after use of this medication before an emergency occurs. What side effects may I notice from receiving this medication? Side effects that you should report to your care team as soon as possible: Allergic reactions--skin rash, itching, hives, swelling of the face, lips, tongue, or throat Side effects that usually do not require medical attention (report these to your care team if they continue or are bothersome): Constipation Dryness or irritation inside the nose Headache Increase in blood pressure Muscle spasms Stuffy nose Toothache This list may not describe all possible side effects. Call your doctor for medical advice about side effects. You may report side effects to FDA   at 1-800-FDA-1088. Where should I keep my medication? Keep out of the reach of children and pets. Store between 20 and 25 degrees C (68 and 77 degrees F). Do not freeze. Throw away any unused medication after the expiration date. Keep in original  box until ready to use. NOTE: This sheet is a summary. It may not cover all possible information. If you have questions about this medicine, talk to your doctor, pharmacist, or health care provider.  2023 Elsevier/Gold Standard (2020-11-30 00:00:00) ____________________________________________________________________________________________  Medication Rules  Purpose: To inform patients, and their family members, of our rules and regulations.  Applies to: All patients receiving prescriptions (written or electronic).  Pharmacy of record: Pharmacy where electronic prescriptions will be sent. If written prescriptions are taken to a different pharmacy, please inform the nursing staff. The pharmacy listed in the electronic medical record should be the one where you would like electronic prescriptions to be sent.  Electronic prescriptions: In compliance with the [place] Strengthen Opioid Misuse Prevention (STOP) Act of 2017 (Session Law 2017-74/H243), effective January 17, 2018, all controlled substances must be electronically prescribed. Calling prescriptions to the pharmacy will cease to exist.  Prescription refills: Only during scheduled appointments. Applies to all prescriptions.  NOTE: The following applies primarily to controlled substances (Opioid* Pain Medications).   Type of encounter (visit): For patients receiving controlled substances, face-to-face visits are required. (Not an option or up to the patient.)  Patient's responsibilities: Pain Pills: Bring all pain pills to every appointment (except for procedure appointments). Pill Bottles: Bring pills in original pharmacy bottle. Always bring the newest bottle. Bring bottle, even if empty. Medication refills: You are responsible for knowing and keeping track of what medications you take and those you need refilled. The day before your appointment: write a list of all prescriptions that need to be refilled. The day of the  appointment: give the list to the admitting nurse. Prescriptions will be written only during appointments. No prescriptions will be written on procedure days. If you forget a medication: it will not be "Called in", "Faxed", or "electronically sent". You will need to get another appointment to get these prescribed. No early refills. Do not call asking to have your prescription filled early. Prescription Accuracy: You are responsible for carefully inspecting your prescriptions before leaving our office. Have the discharge nurse carefully go over each prescription with you, before taking them home. Make sure that your name is accurately spelled, that your address is correct. Check the name and dose of your medication to make sure it is accurate. Check the number of pills, and the written instructions to make sure they are clear and accurate. Make sure that you are given enough medication to last until your next medication refill appointment. Taking Medication: Take medication as prescribed. When it comes to controlled substances, taking less pills or less frequently than prescribed is permitted and encouraged. Never take more pills than instructed. Never take medication more frequently than prescribed.  Inform other Doctors: Always inform, all of your healthcare providers, of all the medications you take. Pain Medication from other Providers: You are not allowed to accept any additional pain medication from any other Doctor or Healthcare provider. There are two exceptions to this rule. (see below) In the event that you require additional pain medication, you are responsible for notifying us, as stated below. Cough Medicine: Often these contain an opioid, such as codeine or hydrocodone. Never accept or take cough medicine containing these opioids if you are already taking   an opioid* medication. The combination may cause respiratory failure and death. Medication Agreement: You are responsible for carefully  reading and following our Medication Agreement. This must be signed before receiving any prescriptions from our practice. Safely store a copy of your signed Agreement. Violations to the Agreement will result in no further prescriptions. (Additional copies of our Medication Agreement are available upon request.) Laws, Rules, & Regulations: All patients are expected to follow all Federal and State Laws, Statutes, Rules, & Regulations. Ignorance of the Laws does not constitute a valid excuse.  Illegal drugs and Controlled Substances: The use of illegal substances (including, but not limited to marijuana and its derivatives) and/or the illegal use of any controlled substances is strictly prohibited. Violation of this rule may result in the immediate and permanent discontinuation of any and all prescriptions being written by our practice. The use of any illegal substances is prohibited. Adopted CDC guidelines & recommendations: Target dosing levels will be at or below 60 MME/day. Use of benzodiazepines** is not recommended.  Exceptions: There are only two exceptions to the rule of not receiving pain medications from other Healthcare Providers. Exception #1 (Emergencies): In the event of an emergency (i.e.: accident requiring emergency care), you are allowed to receive additional pain medication. However, you are responsible for: As soon as you are able, call our office [phone], at any time of the day or night, and leave a message stating your name, the date and nature of the emergency, and the name and dose of the medication prescribed. In the event that your call is answered by a member of our staff, make sure to document and save the date, time, and the name of the person that took your information.  Exception #2 (Planned Surgery): In the event that you are scheduled by another doctor or dentist to have any type of surgery or procedure, you are allowed (for a period no longer than 30 days), to receive  additional pain medication, for the acute post-op pain. However, in this case, you are responsible for picking up a copy of our "Post-op Pain Management for Surgeons" handout, and giving it to your surgeon or dentist. This document is available at our office, and does not require an appointment to obtain it. Simply go to our office during business hours (Monday-Thursday from 8:00 AM to 4:00 PM) (Friday 8:00 AM to 12:00 Noon) or if you have a scheduled appointment with us, prior to your surgery, and ask for it by name. In addition, you are responsible for: calling our office [phone], at any time of the day or night, and leaving a message stating your name, name of your surgeon, type of surgery, and date of procedure or surgery. Failure to comply with your responsibilities may result in termination of therapy involving the controlled substances. Medication Agreement Violation. Following the above rules, including your responsibilities will help you in avoiding a Medication Agreement Violation ("Breaking your Pain Medication Contract").  *Opioid medications include: morphine, codeine, oxycodone, oxymorphone, hydrocodone, hydromorphone, meperidine, tramadol, tapentadol, buprenorphine, fentanyl, methadone. **Benzodiazepine medications include: diazepam (Valium), alprazolam (Xanax), clonazepam (Klonopine), lorazepam (Ativan), clorazepate (Tranxene), chlordiazepoxide (Librium), estazolam (Prosom), oxazepam (Serax), temazepam (Restoril), triazolam (Halcion) (Last updated: 10/14/2020) ____________________________________________________________________________________________  ____________________________________________________________________________________________  Medication Recommendations and Reminders  Applies to: All patients receiving prescriptions (written and/or electronic).  Medication Rules & Regulations: These rules and regulations exist for your safety and that of others. They are not  flexible and neither are we. Dismissing or ignoring them will be considered "non-compliance"   with medication therapy, resulting in complete and irreversible termination of such therapy. (See document titled "Medication Rules" for more details.) In all conscience, because of safety reasons, we cannot continue providing a therapy where the patient does not follow instructions.  Pharmacy of record:  Definition: This is the pharmacy where your electronic prescriptions will be sent.  We do not endorse any particular pharmacy, however, we have experienced problems with [facility] not securing enough medication supply for the community. We do not restrict you in your choice of pharmacy. However, once we write for your prescriptions, we will NOT be re-sending more prescriptions to fix restricted supply problems created by your pharmacy, or your insurance.  The pharmacy listed in the electronic medical record should be the one where you want electronic prescriptions to be sent. If you choose to change pharmacy, simply notify our nursing staff.  Recommendations: Keep all of your pain medications in a safe place, under lock and key, even if you live alone. We will NOT replace lost, stolen, or damaged medication. After you fill your prescription, take 1 week's worth of pills and put them away in a safe place. You should keep a separate, properly labeled bottle for this purpose. The remainder should be kept in the original bottle. Use this as your primary supply, until it runs out. Once it's gone, then you know that you have 1 week's worth of medicine, and it is time to come in for a prescription refill. If you do this correctly, it is unlikely that you will ever run out of medicine. To make sure that the above recommendation works, it is very important that you make sure your medication refill appointments are scheduled at least 1 week before you run out of medicine. To do this in an effective manner, make sure that  you do not leave the office without scheduling your next medication management appointment. Always ask the nursing staff to show you in your prescription , when your medication will be running out. Then arrange for the receptionist to get you a return appointment, at least 7 days before you run out of medicine. Do not wait until you have 1 or 2 pills left, to come in. This is very poor planning and does not take into consideration that we may need to cancel appointments due to bad weather, sickness, or emergencies affecting our staff. DO NOT ACCEPT A "Partial Fill": If for any reason your pharmacy does not have enough pills/tablets to completely fill or refill your prescription, do not allow for a "partial fill". The law allows the pharmacy to complete that prescription within 72 hours, without requiring a new prescription. If they do not fill the rest of your prescription within those 72 hours, you will need a separate prescription to fill the remaining amount, which we will NOT provide. If the reason for the partial fill is your insurance, you will need to talk to the pharmacist about payment alternatives for the remaining tablets, but again, DO NOT ACCEPT A PARTIAL FILL, unless you can trust your pharmacist to obtain the remainder of the pills within 72 hours.  Prescription refills and/or changes in medication(s):  Prescription refills, and/or changes in dose or medication, will be conducted only during scheduled medication management appointments. (Applies to both, written and electronic prescriptions.) No refills on procedure days. No medication will be changed or started on procedure days. No changes, adjustments, and/or refills will be conducted on a procedure day. Doing so will interfere with the diagnostic portion   of the procedure. No phone refills. No medications will be "called into the pharmacy". No Fax refills. No weekend refills. No Holliday refills. No after hours refills.  Remember:   Business hours are:  Monday to Thursday 8:00 AM to 4:00 PM Provider's Schedule: Eulla Kochanowski, MD - Appointments are:  Medication management: Monday and Wednesday 8:00 AM to 4:00 PM Procedure day: Tuesday and Thursday 7:30 AM to 4:00 PM Bilal Lateef, MD - Appointments are:  Medication management: Tuesday and Thursday 8:00 AM to 4:00 PM Procedure day: Monday and Wednesday 7:30 AM to 4:00 PM (Last update: 08/07/2019) ____________________________________________________________________________________________  ____________________________________________________________________________________________  CBD (cannabidiol) & Delta-8 (Delta-8 tetrahydrocannabinol) WARNING  Intro: Cannabidiol (CBD) and tetrahydrocannabinol (THC), are two natural compounds found in plants of the Cannabis genus. They can both be extracted from hemp or cannabis. Hemp and cannabis come from the Cannabis sativa plant. Both compounds interact with your body's endocannabinoid system, but they have very different effects. CBD does not produce the high sensation associated with cannabis. Delta-8 tetrahydrocannabinol, also known as delta-8 THC, is a psychoactive substance found in the Cannabis sativa plant, of which marijuana and hemp are two varieties. THC is responsible for the high associated with the illicit use of marijuana.  Applicable to: All individuals currently taking or considering taking CBD (cannabidiol) and, more important, all patients taking opioid analgesic controlled substances (pain medication). (Example: oxycodone; oxymorphone; hydrocodone; hydromorphone; morphine; methadone; tramadol; tapentadol; fentanyl; buprenorphine; butorphanol; dextromethorphan; meperidine; codeine; etc.)  Legal status: CBD remains a Schedule I drug prohibited for any use. CBD is illegal with one exception. In the [place], CBD has a limited Food and Drug Administration (FDA) approval for the treatment of two specific types of  epilepsy disorders. Only one CBD product has been approved by the FDA for this purpose: "Epidiolex". FDA is aware that some companies are marketing products containing cannabis and cannabis-derived compounds in ways that violate the Federal Food, Drug and Cosmetic Act (FD&C Act) and that may put the health and safety of consumers at risk. The FDA, a Federal agency, has not enforced the CBD status since 2018. UPDATE: (03/05/2021) The Drug Enforcement Agency (DEA) issued a letter stating that "delta" cannabinoids, including Delta-8-THCO and Delta-9-THCO, synthetically derived from hemp do not qualify as hemp and will be viewed as Schedule I drugs. (Schedule I drugs, substances, or chemicals are defined as drugs with no currently accepted medical use and a high potential for abuse. Some examples of Schedule I drugs are: heroin, lysergic acid diethylamide (LSD), marijuana (cannabis), 3,4-methylenedioxymethamphetamine (ecstasy), methaqualone, and peyote.) (https://www.dea.gov)  Legality: Some manufacturers ship CBD products nationally, which is illegal. Often such products are sold online and are therefore available throughout the country. CBD is openly sold in head shops and health food stores in some states where such sales have not been explicitly legalized. Selling unapproved products with unsubstantiated therapeutic claims is not only a violation of the law, but also can put patients at risk, as these products have not been proven to be safe or effective. Federal illegality makes it difficult to conduct research on CBD.  Reference: "FDA Regulation of Cannabis and Cannabis-Derived Products, Including Cannabidiol (CBD)" - https://www.fda.gov/news-events/public-health-focus/fda-regulation-cannabis-and-cannabis-derived-products-including-cannabidiol-cbd  Warning: CBD is not FDA approved and has not undergo the same manufacturing controls as prescription drugs.  This means that the purity and safety of available  CBD may be questionable. Most of the time, despite manufacturer's claims, it is contaminated with THC (delta-9-tetrahydrocannabinol - the chemical in marijuana responsible for the "HIGH").  When this   is the case, the THC contaminant will trigger a positive urine drug screen (UDS) test for Marijuana (carboxy-THC). Because a positive UDS for any illicit substance is a violation of our medication agreement, your opioid analgesics (pain medicine) may be permanently discontinued. The FDA recently put out a warning about 5 things that everyone should be aware of regarding Delta-8 THC: Delta-8 THC products have not been evaluated or approved by the FDA for safe use and may be marketed in ways that put the public health at risk. The FDA has received adverse event reports involving delta-8 THC-containing products. Delta-8 THC has psychoactive and intoxicating effects. Delta-8 THC manufacturing often involve use of potentially harmful chemicals to create the concentrations of delta-8 THC claimed in the marketplace. The final delta-8 THC product may have potentially harmful by-products (contaminants) due to the chemicals used in the process. Manufacturing of delta-8 THC products may occur in uncontrolled or unsanitary settings, which may lead to the presence of unsafe contaminants or other potentially harmful substances. Delta-8 THC products should be kept out of the reach of children and pets.  MORE ABOUT CBD  General Information: CBD was discovered in 1940 and it is a derivative of the cannabis sativa genus plants (Marijuana and Hemp). It is one of the 113 identified substances found in Marijuana. It accounts for up to 40% of the plant's extract. As of 2018, preliminary clinical studies on CBD included research for the treatment of anxiety, movement disorders, and pain. CBD is available and consumed in multiple forms, including inhalation of smoke or vapor, as an aerosol spray, and by mouth. It may be supplied as  an oil containing CBD, capsules, dried cannabis, or as a liquid solution. CBD is thought not to be as psychoactive as THC (delta-9-tetrahydrocannabinol - the chemical in marijuana responsible for the "HIGH"). Studies suggest that CBD may interact with different biological target receptors in the body, including cannabinoid and other neurotransmitter receptors. As of 2018 the mechanism of action for its biological effects has not been determined.  Side-effects  Adverse reactions: Dry mouth, diarrhea, decreased appetite, fatigue, drowsiness, malaise, weakness, sleep disturbances, and others.  Drug interactions: CBC may interact with other medications such as blood-thinners. Because CBD causes drowsiness on its own, it also increases the drowsiness caused by other medications, including antihistamines (such as Benadryl), benzodiazepines (Xanax, Ativan, Valium), antipsychotics, antidepressants and opioids, as well as alcohol and supplements such as kava, melatonin and St. John's Wort. Be cautious with the following combinations:   Brivaracetam (Briviact) Brivaracetam is changed and broken down by the body. CBD might decrease how quickly the body breaks down brivaracetam. This might increase levels of brivaracetam in the body.  Caffeine Caffeine is changed and broken down by the body. CBD might decrease how quickly the body breaks down caffeine. This might increase levels of caffeine in the body.  Carbamazepine (Tegretol) Carbamazepine is changed and broken down by the body. CBD might decrease how quickly the body breaks down carbamazepine. This might increase levels of carbamazepine in the body and increase its side effects.  Citalopram (Celexa) Citalopram is changed and broken down by the body. CBD might decrease how quickly the body breaks down citalopram. This might increase levels of citalopram in the body and increase its side effects.  Clobazam (Onfi) Clobazam is changed and broken down by the  liver. CBD might decrease how quickly the liver breaks down clobazam. This might increase the effects and side effects of clobazam.  Eslicarbazepine (Aptiom) Eslicarbazepine is changed and   broken down by the body. CBD might decrease how quickly the body breaks down eslicarbazepine. This might increase levels of eslicarbazepine in the body by a small amount.  Everolimus (Zostress) Everolimus is changed and broken down by the body. CBD might decrease how quickly the body breaks down everolimus. This might increase levels of everolimus in the body.  Lithium Taking higher doses of CBD might increase levels of lithium. This can increase the risk of lithium toxicity.  Medications changed by the liver (Cytochrome P450 1A1 (CYP1A1) substrates) Some medications are changed and broken down by the liver. CBD might change how quickly the liver breaks down these medications. This could change the effects and side effects of these medications.  Medications changed by the liver (Cytochrome P450 1A2 (CYP1A2) substrates) Some medications are changed and broken down by the liver. CBD might change how quickly the liver breaks down these medications. This could change the effects and side effects of these medications.  Medications changed by the liver (Cytochrome P450 1B1 (CYP1B1) substrates) Some medications are changed and broken down by the liver. CBD might change how quickly the liver breaks down these medications. This could change the effects and side effects of these medications.  Medications changed by the liver (Cytochrome P450 2A6 (CYP2A6) substrates) Some medications are changed and broken down by the liver. CBD might change how quickly the liver breaks down these medications. This could change the effects and side effects of these medications.  Medications changed by the liver (Cytochrome P450 2B6 (CYP2B6) substrates) Some medications are changed and broken down by the liver. CBD might change how  quickly the liver breaks down these medications. This could change the effects and side effects of these medications.  Medications changed by the liver (Cytochrome P450 2C19 (CYP2C19) substrates) Some medications are changed and broken down by the liver. CBD might change how quickly the liver breaks down these medications. This could change the effects and side effects of these medications.  Medications changed by the liver (Cytochrome P450 2C8 (CYP2C8) substrates) Some medications are changed and broken down by the liver. CBD might change how quickly the liver breaks down these medications. This could change the effects and side effects of these medications.  Medications changed by the liver (Cytochrome P450 2C9 (CYP2C9) substrates) Some medications are changed and broken down by the liver. CBD might change how quickly the liver breaks down these medications. This could change the effects and side effects of these medications.  Medications changed by the liver (Cytochrome P450 2D6 (CYP2D6) substrates) Some medications are changed and broken down by the liver. CBD might change how quickly the liver breaks down these medications. This could change the effects and side effects of these medications.  Medications changed by the liver (Cytochrome P450 2E1 (CYP2E1) substrates) Some medications are changed and broken down by the liver. CBD might change how quickly the liver breaks down these medications. This could change the effects and side effects of these medications.  Medications changed by the liver (Cytochrome P450 3A4 (CYP3A4) substrates) Some medications are changed and broken down by the liver. CBD might change how quickly the liver breaks down these medications. This could change the effects and side effects of these medications.  Medications changed by the liver (Glucuronidated drugs) Some medications are changed and broken down by the liver. CBD might change how quickly the liver breaks  down these medications. This could change the effects and side effects of these medications.  Medications that decrease the breakdown   of other medications by the liver (Cytochrome P450 2C19 (CYP2C19) inhibitors) CBD is changed and broken down by the liver. Some drugs decrease how quickly the liver changes and breaks down CBD. This could change the effects and side effects of CBD.  Medications that decrease the breakdown of other medications in the liver (Cytochrome P450 3A4 (CYP3A4) inhibitors) CBD is changed and broken down by the liver. Some drugs decrease how quickly the liver changes and breaks down CBD. This could change the effects and side effects of CBD.  Medications that increase breakdown of other medications by the liver (Cytochrome P450 3A4 (CYP3A4) inducers) CBD is changed and broken down by the liver. Some drugs increase how quickly the liver changes and breaks down CBD. This could change the effects and side effects of CBD.  Medications that increase the breakdown of other medications by the liver (Cytochrome P450 2C19 (CYP2C19) inducers) CBD is changed and broken down by the liver. Some drugs increase how quickly the liver changes and breaks down CBD. This could change the effects and side effects of CBD.  Methadone (Dolophine) Methadone is broken down by the liver. CBD might decrease how quickly the liver breaks down methadone. Taking cannabidiol along with methadone might increase the effects and side effects of methadone.  Rufinamide (Banzel) Rufinamide is changed and broken down by the body. CBD might decrease how quickly the body breaks down rufinamide. This might increase levels of rufinamide in the body by a small amount.  Sedative medications (CNS depressants) CBD might cause sleepiness and slowed breathing. Some medications, called sedatives, can also cause sleepiness and slowed breathing. Taking CBD with sedative medications might cause breathing problems and/or too  much sleepiness.  Sirolimus (Rapamune) Sirolimus is changed and broken down by the body. CBD might decrease how quickly the body breaks down sirolimus. This might increase levels of sirolimus in the body.  Stiripentol (Diacomit) Stiripentol is changed and broken down by the body. CBD might decrease how quickly the body breaks down stiripentol. This might increase levels of stiripentol in the body and increase its side effects.  Tacrolimus (Prograf) Tacrolimus is changed and broken down by the body. CBD might decrease how quickly the body breaks down tacrolimus. This might increase levels of tacrolimus in the body.  Tamoxifen (Soltamox) Tamoxifen is changed and broken down by the body. CBD might affect how quickly the body breaks down tamoxifen. This might affect levels of tamoxifen in the body.  Topiramate (Topamax) Topiramate is changed and broken down by the body. CBD might decrease how quickly the body breaks down topiramate. This might increase levels of topiramate in the body by a small amount.  Valproate Valproic acid can cause liver injury. Taking cannabidiol with valproic acid might increase the chance of liver injury. CBD and/or valproic acid might need to be stopped, or the dose might need to be reduced.  Warfarin (Coumadin) CBD might increase levels of warfarin, which can increase the risk for bleeding. CBD and/or warfarin might need to be stopped, or the dose might need to be reduced.  Zonisamide Zonisamide is changed and broken down by the body. CBD might decrease how quickly the body breaks down zonisamide. This might increase levels of zonisamide in the body by a small amount. (Last update: 03/17/2021) ____________________________________________________________________________________________  ____________________________________________________________________________________________  Drug Holidays (Slow)  What is a "Drug Holiday"? Drug Holiday: is the name given to  the period of time during which a patient stops taking a medication(s) for the purpose of eliminating tolerance   to the drug.  Benefits Improved effectiveness of opioids. Decreased opioid dose needed to achieve benefits. Improved pain with lesser dose.  What is tolerance? Tolerance: is the progressive decreased in effectiveness of a drug due to its repetitive use. With repetitive use, the body gets use to the medication and as a consequence, it loses its effectiveness. This is a common problem seen with opioid pain medications. As a result, a larger dose of the drug is needed to achieve the same effect that used to be obtained with a smaller dose.  How long should a "Drug Holiday" last? You should stay off of the pain medicine for at least 14 consecutive days. (2 weeks)  Should I stop the medicine "cold turkey"? No. You should always coordinate with your Pain Specialist so that he/she can provide you with the correct medication dose to make the transition as smoothly as possible.  How do I stop the medicine? Slowly. You will be instructed to decrease the daily amount of pills that you take by one (1) pill every seven (7) days. This is called a "slow downward taper" of your dose. For example: if you normally take four (4) pills per day, you will be asked to drop this dose to three (3) pills per day for seven (7) days, then to two (2) pills per day for seven (7) days, then to one (1) per day for seven (7) days, and at the end of those last seven (7) days, this is when the "Drug Holiday" would start.   Will I have withdrawals? By doing a "slow downward taper" like this one, it is unlikely that you will experience any significant withdrawal symptoms. Typically, what triggers withdrawals is the sudden stop of a high dose opioid therapy. Withdrawals can usually be avoided by slowly decreasing the dose over a prolonged period of time. If you do not follow these instructions and decide to stop your  medication abruptly, withdrawals may be possible.  What are withdrawals? Withdrawals: refers to the wide range of symptoms that occur after stopping or dramatically reducing opiate drugs after heavy and prolonged use. Withdrawal symptoms do not occur to patients that use low dose opioids, or those who take the medication sporadically. Contrary to benzodiazepine (example: Valium, Xanax, etc.) or alcohol withdrawals ("Delirium Tremens"), opioid withdrawals are not lethal. Withdrawals are the physical manifestation of the body getting rid of the excess receptors.  Expected Symptoms Early symptoms of withdrawal may include: Agitation Anxiety Muscle aches Increased tearing Insomnia Runny nose Sweating Yawning  Late symptoms of withdrawal may include: Abdominal cramping Diarrhea Dilated pupils Goose bumps Nausea Vomiting  Will I experience withdrawals? Due to the slow nature of the taper, it is very unlikely that you will experience any.  What is a slow taper? Taper: refers to the gradual decrease in dose.  (Last update: 08/07/2019) ____________________________________________________________________________________________   ____________________________________________________________________________________________  Pharmacy Shortages of Pain Medication   Introduction Shockingly as it may seem, .  "No [place] Supreme Court decision has ever interpreted the Constitution as guaranteeing a right to health care for all Americans." - https://www.healthequityandpolicylab.com/elusive-right-to-health-care-under-us-law  "With respect to human rights, the [place] has no formally codified right to health, nor does it participate in a human rights treaty that specifies a right to health." - Scott J. Schweikart, JD, MBE  Situation By now, most of our patients have had the experience of being told by their pharmacist that they do not have enough medication to cover their prescription.  If you have   not had this experience, just know that you soon will.  Problem There appears to be a shortage of these medications, either at the national level or locally. This is happening with all pharmacies. When there is not enough medication, patients are offered a partial fill and they are told that they will try to get the rest of the medicine for them at a later time. If they do not have enough for even a partial fill, the pharmacists are telling the patients to call us (the prescribing physicians) to request that we send another prescription to another pharmacy to get the medicine.   This reordering of a controlled substance creates documentation problems where additional paperwork needs to be created to explain why two prescriptions for the same period of time and the same medicine are being prescribed to the same patient. It also creates situations where the last appointment note does not accurately reflect when and what prescriptions were given to a patient. This leads to prescribing errors down the line, in subsequent follow-up visits.   [place] Board [employer]acy ([place]) Research revealed that Board of Pharmacy Rule .1806 (21 NCAC 46.1806) authorizes pharmacists to the transfer of prescriptions among pharmacies, and it sets forth procedural and recordkeeping requirements for doing so. However, this requires the pharmacist to complete the previously mentioned procedural paperwork to accomplish the transfer. As it turns out, it is much easier for them to have the prescribing physicians do the work.   Possible solutions 1. You can ask your physician to assist you in weaning yourself off these medications. 2. Ask your pharmacy if the medication is in stock, 3 days prior to your refill. 3. If you need a pharmacy change, let us know at your medication management visit. Prescriptions that have already been electronically sent to a pharmacy will not be re-sent to a different pharmacy if your  pharmacy of record does not have it in stock. Proper stocking of medication is a pharmacy problem, not a prescriber problem. Work with your pharmacist to solve the problem. 4. Have the [place] Assembly add a provision to the "STOP ACT" (the law that mandates how controlled substances are prescribed) where there is an exception to the electronic prescribing rule that states that in the event there are shortages of medications the physicians are allowed to use written prescriptions as opposed to electronic ones. This would allow patients to take their prescriptions to a different pharmacy that may have enough medication available to fill the prescription. The problem is that currently there is a law that does not allow for written prescriptions, with the exception of instances where the electronic medical record is down due to technical issues.  5. Have [place] Congress ease the pressure on pharmaceutical companies, allowing them to produce enough quantities of the medication to adequately supply the population. 6. Have pharmacies keep enough stocks of these medications to cover their client base.  7. Have the [place] State Assembly add a provision to the "STOP ACT" where they ease the regulations surrounding the transfer of controlled substances between pharmacies, so as to simplify the transfer of supplies. As an alternative, develop a system to allow patients to obtain the remainder of their prescription at another one of their pharmacies or at an associate pharmacy.   How this shortage will affect you.  Understand that this is a pharmacy supply problem, not a prescriber problem. Work with your pharmacy to solve it. The job of the prescriber is to evaluate   and monitor the patient for the appropriate indications and use of these medicines. It is not the job of the prescriber to supply the medication or to solve problems with that supply. The responsibility and the choice to obtain the  medication resides on the patient. By law, supplying the medication is the job of the pharmacy. It is certainly not the job of the prescriber to solve supply problems.   Due to the above problems we are no longer taking patients to write for their pain medication. Future discussions with your physician may include potentially weaning medications or transitioning to alternatives.  We will be focusing primarily on interventional based pain management. We will continue to evaluate for appropriate indications and we may provide recommendations regarding medication, dose, and schedule, as well as monitoring recommendations, however, we will not be taking over the actual prescribing of these substances. On those patients where we are treating their chronic pain with interventional therapies, exceptions will be considered on a case by case basis. At this time, we will try to continue providing this supplemental service to those patients we have been managing in the past. However, as of August 1st, 2023, we no longer will be sending additional prescriptions to other pharmacies for the purpose of solving their supply problems. Once we send a prescription to a pharmacy, we will not be resending it again to another pharmacy to cover for their shortages.   What to do. Write as many letters as you can. Recruit the help of family members in writing these letters. Below are some of the places where you can write to make your voice heard. Let them know what the problem is and push them to look for solutions.   Search internet for: "[place] find your legislators" https://www.ncleg.gov/findyourlegislators  Search internet for: "[place] insurance commissioner complaints" https://www.ncdoi.gov/contactscomplaints/assistance-or-file-complaint  Search internet for: " Board of Pharmacy complaints" http://www.ncbop.org/contact.htm  Search internet for: "[facility] pharmacy complaints" Email [facility]  Customer Relations https://www.[facility].com/help/email-customer-relations.jsp?callType=store  Search internet for: "[facility] pharmacy customer service complaints" https://www.[facility].com/topic/marketing/contactus/contactus_customerservice.jsp  ____________________________________________________________________________________________   

## 2021-10-25 NOTE — Progress Notes (Signed)
Safety precautions to be maintained throughout the outpatient stay will include: orient to surroundings, keep bed in low position, maintain call bell within reach at all times, provide assistance with transfer out of bed and ambulation.   Nursing Pain Medication Assessment:  Safety precautions to be maintained throughout the outpatient stay will include: orient to surroundings, keep bed in low position, maintain call bell within reach at all times, provide assistance with transfer out of bed and ambulation.  Medication Inspection Compliance: Pill count conducted under aseptic conditions, in front of the patient. Neither the pills nor the bottle was removed from the patient's sight at any time. Once count was completed pills were immediately returned to the patient in their original bottle.  Medication: Oxycodone/APAP Pill/Patch Count:  46 of 120 pills remain Pill/Patch Appearance: Markings consistent with prescribed medication Bottle Appearance: Standard pharmacy container. Clearly labeled. Filled Date: 09 / 21 / 2023 Last Medication intake:  Today

## 2021-10-27 ENCOUNTER — Telehealth: Payer: Self-pay | Admitting: Pain Medicine

## 2021-10-27 ENCOUNTER — Telehealth: Payer: Self-pay | Admitting: *Deleted

## 2021-10-27 NOTE — Telephone Encounter (Signed)
PT stated that [facility] is wanting to seen pt to another pain doctor to get some injections done. Pt stated that he wants to know before he scheduled appt is it okay. Please give patient a call. Thanks

## 2021-10-27 NOTE — Telephone Encounter (Signed)
Patient unsure of procedure that [facility] wants to refer to another pain clinic. I instructed him to call us back with more info and I could help him. Patient understands instructions.

## 2021-10-27 NOTE — Telephone Encounter (Signed)
Called patient and he has decided to go ahead with the  right elbow injection -as scheduled here on 11/02/2021.

## 2021-10-30 NOTE — Progress Notes (Unsigned)
PROVIDER NOTE: Interpretation of information contained herein should be left to medically-trained personnel. Specific patient instructions are provided elsewhere under "Patient Instructions" section of medical record. This document was created in part using STT-dictation technology, any transcriptional errors that may result from this process are unintentional.  Patient: Adam Petty. Type: Established DOB: Mar 24, 1960 MRN: [id number] PCP: System, Provider Not In  Service: Procedure DOS: 11/02/2021 Setting: Ambulatory Location: Ambulatory outpatient facility Delivery: Face-to-face Provider: Oswaldo Done, MD Specialty: Interventional Pain Management Specialty designation: 09 Location: Outpatient facility Ref. Prov.: No ref. provider found    Primary Reason for Visit: Interventional Pain Management Treatment. CC: No chief complaint on file.    Procedure:          Anesthesia, Analgesia, Anxiolysis:  Type: Diagnostic Elbow Joint Steroid Injection           Region: Lateral Elbow Area Level: Elbow Laterality: Right  Type: Local Anesthesia Local Anesthetic: Lidocaine 1-2% Sedation: None  Indication(s):  Analgesia Route: Infiltration (Electra/IM) IV Access: N/A   Position: Sitting   No diagnosis found. NAS-11 Pain score:   Pre-procedure:  /10   Post-procedure:  /10     Pre-op H&P Assessment:  Adam Petty is a 61 y.o. (year old), male patient, seen today for interventional treatment. He  has a past surgical history that includes Tonsillectomy; Shoulder surgery; Palate surgery; Cervical fusion; Foot surgery; Lumbar laminectomy/decompression microdiscectomy (N/A, 02/06/2013); Knee arthroscopy (Right, 10/02/2013); Knee arthroscopy with medial menisectomy (Right, 03/25/2015); Knee bursectomy (Right, 03/25/2015); Cystoscopy w/ retrogrades (Right, 11/19/2015); Cystoscopy w/ retrogrades (Right, 10/03/2017); Ureteroscopy (Right, 10/03/2017); and CTR (Left, 02/27/2018). Adam Petty has a current  medication list which includes the following prescription(s): cholecalciferol, cyanocobalamin, gabapentin, metformin, naloxone, naproxen sodium, [START ON 11/03/2021] oxycodone-acetaminophen, [START ON 12/03/2021] oxycodone-acetaminophen, [START ON 01/02/2022] oxycodone-acetaminophen, ozempic (0.25 or 0.5 mg/dose), ramipril, and rosuvastatin. His primarily concern today is the No chief complaint on file.  Initial Vital Signs:  Pulse/HCG Rate:    Temp:   Resp:   BP:   SpO2:    BMI: Estimated body mass index is 35.51 kg/m as calculated from the following:   Height as of 10/25/21: 5\' 6"  (1.676 m).   Weight as of 10/25/21: 220 lb (99.8 kg).  Risk Assessment: Allergies: Reviewed. He has No Known Allergies.  Allergy Precautions: None required Coagulopathies: Reviewed. None identified.  Blood-thinner therapy: None at this time Active Infection(s): Reviewed. None identified. Adam Petty is afebrile  Site Confirmation: Adam Petty was asked to confirm the procedure and laterality before marking the site Procedure checklist: Completed Consent: Before the procedure and under the influence of no sedative(s), amnesic(s), or anxiolytics, the patient was informed of the treatment options, risks and possible complications. To fulfill our ethical and legal obligations, as recommended by the American Medical Association's Code of Ethics, I have informed the patient of my clinical impression; the nature and purpose of the treatment or procedure; the risks, benefits, and possible complications of the intervention; the alternatives, including doing nothing; the risk(s) and benefit(s) of the alternative treatment(s) or procedure(s); and the risk(s) and benefit(s) of doing nothing. The patient was provided information about the general risks and possible complications associated with the procedure. These may include, but are not limited to: failure to achieve desired goals, infection, bleeding, organ or nerve damage,  allergic reactions, paralysis, and death. In addition, the patient was informed of those risks and complications associated to the procedure, such as failure to decrease pain; infection; bleeding; organ or nerve damage with subsequent  damage to sensory, motor, and/or autonomic systems, resulting in permanent pain, numbness, and/or weakness of one or several areas of the body; allergic reactions; (i.e.: anaphylactic reaction); and/or death. Furthermore, the patient was informed of those risks and complications associated with the medications. These include, but are not limited to: allergic reactions (i.e.: anaphylactic or anaphylactoid reaction(s)); adrenal axis suppression; blood sugar elevation that in diabetics may result in ketoacidosis or comma; water retention that in patients with history of congestive heart failure may result in shortness of breath, pulmonary edema, and decompensation with resultant heart failure; weight gain; swelling or edema; medication-induced neural toxicity; particulate matter embolism and blood vessel occlusion with resultant organ, and/or nervous system infarction; and/or aseptic necrosis of one or more joints. Finally, the patient was informed that Medicine is not an exact science; therefore, there is also the possibility of unforeseen or unpredictable risks and/or possible complications that may result in a catastrophic outcome. The patient indicated having understood very clearly. We have given the patient no guarantees and we have made no promises. Enough time was given to the patient to ask questions, all of which were answered to the patient's satisfaction. Adam Petty has indicated that he wanted to continue with the procedure. Attestation: I, the ordering provider, attest that I have discussed with the patient the benefits, risks, side-effects, alternatives, likelihood of achieving goals, and potential problems during recovery for the procedure that I have provided  informed consent. Date  Time: {CHL [facility]-PAIN TIME CHOICES:21018001}  Pre-Procedure Preparation:  Monitoring: As per clinic protocol. Respiration, ETCO2, SpO2, BP, heart rate and rhythm monitor placed and checked for adequate function Safety Precautions: Patient was assessed for positional comfort and pressure points before starting the procedure. Time-out: I initiated and conducted the "Time-out" before starting the procedure, as per protocol. The patient was asked to participate by confirming the accuracy of the "Time Out" information. Verification of the correct person, site, and procedure were performed and confirmed by me, the nursing staff, and the patient. "Time-out" conducted as per Joint Commission's Universal Protocol (UP.01.01.01). Time:    Description of Procedure:          Target Area:  ***  Approach:  ***  approach. Area Prepped: Entire elbow Region DuraPrep (Iodine Povacrylex [0.7% available iodine] and Isopropyl Alcohol, 74% w/w) Safety Precautions: Aspiration looking for blood return was conducted prior to all injections. At no point did we inject any substances, as a needle was being advanced. No attempts were made at seeking any paresthesias. Safe injection practices and needle disposal techniques used. Medications properly checked for expiration dates. SDV (single dose vial) medications used. Description of the Procedure: Protocol guidelines were followed. The patient was placed in position. The target area was identified and the area prepped in the usual manner. Skin & deeper tissues infiltrated with local anesthetic. Appropriate amount of time allowed to pass for local anesthetics to take effect. The procedure needles were then advanced to the target area. Proper needle placement secured. Negative aspiration confirmed. Solution injected in intermittent fashion, asking for systemic symptoms every 0.5cc of injectate. The needles were then removed and the area cleansed, making sure  to leave some of the prepping solution back to take advantage of its long term bactericidal properties. There were no vitals filed for this visit.  Start Time:   hrs. End Time:   hrs. Materials:  Needle(s) Type: Spinal Needle Gauge: 22G Length: 3.5-in Medication(s): Please see orders for medications and dosing details.  Imaging Guidance:          Type of Imaging Technique: None used Indication(s): N/A Exposure Time: No patient exposure Contrast: None used. Fluoroscopic Guidance: N/A Ultrasound Guidance: N/A Interpretation: N/A  Antibiotic Prophylaxis:   Anti-infectives (From admission, onward)    None      Indication(s): None identified  Post-operative Assessment:  Post-procedure Vital Signs:  Pulse/HCG Rate:    Temp:   Resp:   BP:   SpO2:    EBL: None  Complications: No immediate post-treatment complications observed by team, or reported by patient.  Note: The patient tolerated the entire procedure well. A repeat set of vitals were taken after the procedure and the patient was kept under observation following institutional policy, for this type of procedure. Post-procedural neurological assessment was performed, showing return to baseline, prior to discharge. The patient was provided with post-procedure discharge instructions, including a section on how to identify potential problems. Should any problems arise concerning this procedure, the patient was given instructions to immediately contact us, at any time, without hesitation. In any case, we plan to contact the patient by telephone for a follow-up status report regarding this interventional procedure.  Comments:  No additional relevant information.  Plan of Care  Orders:  No orders of the defined types were placed in this encounter.  Chronic Opioid Analgesic:  Oxycodone IR 5 mg, 1 tab PO q 6 hrs (20 mg/day)  MME/day: 30 mg/day.   Medications ordered for procedure: No orders of the  defined types were placed in this encounter.  Medications administered: [name]. "Pilar Plate" had no medications administered during this visit.  See the medical record for exact dosing, route, and time of administration.  Follow-up plan:   No follow-ups on file.       Interventional Therapies  Risk  Complexity Considerations:   Estimated body mass index is 38.74 kg/m as calculated from the following:   Height as of this encounter: 5\' 6"  (1.676 m).   Weight as of this encounter: 240 lb (108.9 kg). WNL   Planned  Pending:      Under consideration:   Possible right Suprascapular nerve RFA  Possible left lumbar facet RFA  Diagnostic right knee genicular NB    Completed:   Diagnostic right caudal ESI x1 + epidurogram #1 (10/02/2019)  Palliative right lumbar facet MBB x4 (04/02/2019)  Palliative left lumbar facet MBB x2 (12/16/2015)  Palliative right lumbar facet RFA x1  (Right: 04/13/2016) (100% relief)  Diagnostic/therapeutic bilateral SI joint block x2 (11/17/2016)  Diagnostic right Suprascapular NB x1 (06/22/2016)    Therapeutic  Palliative (PRN) options:   Palliative lumbar facet block  Palliative right lumbar facet RFA #2  Diagnostic Suprascapular nerve Block        Recent Visits Date Type Provider Dept  10/25/21 Office Visit Milinda Pointer, MD [facility]  Showing recent visits within past 90 days and meeting all other requirements Future Appointments Date Type Provider Dept  11/02/21 Appointment Milinda Pointer, MD [facility]  01/26/22 Appointment Milinda Pointer, MD [facility]  Showing future appointments within next 90 days and meeting all other requirements  Disposition: Discharge home  Discharge (Date  Time): 11/02/2021;   hrs.   Primary Care Physician: System, Provider Not In Location: [facility] Note by: Gaspar Cola, MD Date: 11/02/2021; Time: 9:18  AM  Disclaimer:  Medicine is not an Chief Strategy Officer. The only guarantee in medicine is that nothing is guaranteed. It  is important to note that the decision to proceed with this intervention was based on the information collected from the patient. The Data and conclusions were drawn from the patient's questionnaire, the interview, and the physical examination. Because the information was provided in large part by the patient, it cannot be guaranteed that it has not been purposely or unconsciously manipulated. Every effort has been made to obtain as much relevant data as possible for this evaluation. It is important to note that the conclusions that lead to this procedure are derived in large part from the available data. Always take into account that the treatment will also be dependent on availability of resources and existing treatment guidelines, considered by other Pain Management Practitioners as being common knowledge and practice, at the time of the intervention. For Medico-Legal purposes, it is also important to point out that variation in procedural techniques and pharmacological choices are the acceptable norm. The indications, contraindications, technique, and results of the above procedure should only be interpreted and judged by a Board-Certified Interventional Pain Specialist with extensive familiarity and expertise in the same exact procedure and technique.

## 2021-11-02 ENCOUNTER — Encounter: Payer: Self-pay | Admitting: Pain Medicine

## 2021-11-02 ENCOUNTER — Ambulatory Visit: Admission: RE | Admit: 2021-11-02 | Payer: BC Managed Care – PPO | Source: Ambulatory Visit

## 2021-11-02 ENCOUNTER — Ambulatory Visit: Payer: BC Managed Care – PPO | Attending: Pain Medicine | Admitting: Pain Medicine

## 2021-11-02 VITALS — BP 148/88 | HR 84 | Temp 98.4°F | Resp 16 | Ht 66.0 in | Wt 223.0 lb

## 2021-11-02 DIAGNOSIS — M7711 Lateral epicondylitis, right elbow: Secondary | ICD-10-CM | POA: Diagnosis present

## 2021-11-02 DIAGNOSIS — M25521 Pain in right elbow: Secondary | ICD-10-CM

## 2021-11-02 MED ORDER — ROPIVACAINE HCL 2 MG/ML IJ SOLN
9.0000 mL | Freq: Once | INTRAMUSCULAR | Status: AC
  Administered 2021-11-02: 9 mL
  Filled 2021-11-02: qty 20

## 2021-11-02 MED ORDER — PENTAFLUOROPROP-TETRAFLUOROETH EX AERO: INHALATION_SPRAY | Freq: Once | CUTANEOUS | Status: DC

## 2021-11-02 MED ORDER — METHYLPREDNISOLONE ACETATE 80 MG/ML IJ SUSP
80.0000 mg | Freq: Once | INTRAMUSCULAR | Status: AC
  Administered 2021-11-02: 80 mg
  Filled 2021-11-02: qty 1

## 2021-11-02 MED ORDER — LIDOCAINE HCL 2 % IJ SOLN
20.0000 mL | Freq: Once | INTRAMUSCULAR | Status: AC
  Administered 2021-11-02: 400 mg
  Filled 2021-11-02: qty 20

## 2021-11-02 NOTE — Patient Instructions (Signed)

## 2021-11-03 ENCOUNTER — Telehealth: Payer: Self-pay | Admitting: *Deleted

## 2021-11-03 NOTE — Telephone Encounter (Signed)
Post procedure call;  patient reports he is doing well.  

## 2021-11-15 ENCOUNTER — Telehealth: Payer: Self-pay

## 2021-11-15 NOTE — Progress Notes (Unsigned)
Patient: Adam Petty.  Service Category: E/M  Provider: Gaspar Cola, MD  DOB: [birth year]  DOS: 11/16/2021  Location: Office  MRN: [id number]  Setting: Ambulatory outpatient  Referring Provider: No ref. provider found  Type: Established Patient  Specialty: Interventional Pain Management  PCP: System, Provider Not In  Location: Remote location  Delivery: TeleHealth     Virtual Encounter - Pain Management PROVIDER NOTE: Information contained herein reflects review and annotations entered in association with encounter. Interpretation of such information and data should be left to medically-trained personnel. Information provided to patient can be located elsewhere in the medical record under "Patient Instructions". Document created using STT-dictation technology, any transcriptional errors that may result from process are unintentional.    Contact & Pharmacy Preferred: [phone] Home: [phone] (home) Mobile: [phone] (mobile) E-mail: [email or web address]  [place] ([place]), [place] ([place]) [place] [address] Phone: [phone] Fax: [place] [address] [place] [address] Phone: [phone] Fax: [phone]   Pre-screening  Mr. Osuna offered "in-person" vs "virtual" encounter. He indicated preferring virtual for this encounter.   Reason COVID-19*  Social distancing based on CDC and AMA recommendations.   I contacted [name]. on 11/16/2021 via telephone.      I clearly identified myself as Gaspar Cola, MD. I verified that I was speaking with the correct person using two identifiers (Name: Astin Sayre., and date of birth: [birth year]).  Consent I sought verbal advanced consent from [name]. for virtual visit interactions. I informed Mr. Granderson of possible security and  privacy concerns, risks, and limitations associated with providing "not-in-person" medical evaluation and management services. I also informed Mr. Ditullio of the availability of "in-person" appointments. Finally, I informed him that there would be a charge for the virtual visit and that he could be  personally, fully or partially, financially responsible for it. Mr. Marcin expressed understanding and agreed to proceed.   Historic Elements   Mr. Ricco Dershem. is a 61 y.o. year old, male patient evaluated today after our last contact on 11/02/2021. Mr. 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 He also  has a past surgical history that includes Tonsillectomy; Shoulder surgery; Palate surgery; Cervical fusion; Foot surgery; Lumbar laminectomy/decompression microdiscectomy (N/A, 02/06/2013); Knee arthroscopy (Right, 10/02/2013); Knee arthroscopy with medial menisectomy (Right, 03/25/2015); Knee bursectomy (Right, 03/25/2015); Cystoscopy w/ retrogrades (Right, 11/19/2015); Cystoscopy w/ retrogrades (Right, 10/03/2017); Ureteroscopy (Right, 10/03/2017); and CTR (Left, 02/27/2018). Mr. Fyock has a current medication list which includes the following prescription(s): cholecalciferol, cyanocobalamin, metformin, naloxone, naproxen sodium, oxycodone-acetaminophen, [START ON 12/03/2021] oxycodone-acetaminophen, [START ON 01/02/2022] oxycodone-acetaminophen, ozempic (0.25 or 0.5 mg/dose), ramipril, rosuvastatin, and gabapentin. He  reports that he has never smoked. He has never used smokeless tobacco. He reports current alcohol use. He reports that he does not use drugs. Mr. Mccorkel has No Known Allergies.   HPI  Today, he is being contacted for a post-procedure  assessment.  Post-procedure evaluation    Procedure:          Anesthesia, Analgesia, Anxiolysis:  Type: Diagnostic Elbow Joint Steroid Injection  #1  Region: Lateral Elbow Area Level: Elbow Laterality: Right  Type: Local Anesthesia Local Anesthetic: Lidocaine 1-2% Sedation: None  Indication(s):  Analgesia Route: Infiltration (/IM) IV Access: N/A   Position: Sitting   1. Elbow pain (Right)   2. Lateral epicondylitis of elbow (Right)   3. Tennis elbow (Right)    NAS-11 Pain score:   Pre-procedure: 3 /10   Post-procedure: 0-No pain/10      Effectiveness:  Initial hour after procedure: 100 %. Subsequent 4-6 hours post-procedure: 100 %. Analgesia past initial 6 hours: 0 %. Ongoing improvement:  Analgesic: The patient indicates having attained 100% relief of the pain for the duration of the local anesthetic however this did not turn into any long-term benefit.  He denies any changes in the location of the pain and although we essentially injected the lateral epicondyles and the area adjacent to the ulnar groove, neither 1 provided him with any long-term benefit.  He refers having done physical therapy for this as well as dry needling, none of which has given him any benefit.  At this point I believe that he would benefit from an MRI of the area. Function: No improvement ROM: No improvement  Pharmacotherapy Assessment   Opioid Analgesic: Oxycodone IR 5 mg, 1 tab PO q 6 hrs (20 mg/day)  MME/day: 30 mg/day.   Monitoring: [place] PMP: PDMP reviewed during this encounter.       Pharmacotherapy: No side-effects or adverse reactions reported. Compliance: No problems identified. Effectiveness: Clinically acceptable. Plan: Refer to "POC". UDS:  Summary  Date Value Ref Range Status  07/26/2021 Note  Final    Comment:    ==================================================================== [facility]  (MW) ==================================================================== Test                             Result       Flag       Units  Drug Present and Declared for Prescription Verification   Oxycodone                      2655         EXPECTED   ng/mg creat   Oxymorphone                    1235         EXPECTED   ng/mg creat   Noroxycodone                   2413         EXPECTED   ng/mg creat   Noroxymorphone                 438          EXPECTED   ng/mg creat    Sources of oxycodone are scheduled prescription medications.    Oxymorphone, noroxycodone, and noroxymorphone are expected    metabolites of oxycodone. Oxymorphone is also available as a    scheduled prescription medication.  ==================================================================== Test                      Result    Flag   Units      Ref Range   Creatinine              280              mg/dL      >=20 ==================================================================== Declared Medications:  The flagging and interpretation on this report are based  on the  following declared medications.  Unexpected results may arise from  inaccuracies in the declared medications.   **Note: The testing scope of this panel includes these medications:   Oxycodone (Percocet)   **Note: The testing scope of this panel does not include the  following reported medications:   Acetaminophen (Percocet)  Bisacodyl  Cholecalciferol  Docusate (Colace)  Gabapentin (Neurontin)  Meloxicam (Mobic)  Metformin (Glucophage)  Naproxen (Aleve)  Ramipril (Altace)  Rosuvastatin (Crestor)  Supplement (Fiber)  Vitamin B12 ==================================================================== For clinical consultation, please call [phone]. ====================================================================    No results found for: "CBDTHCR", "D8THCCBX", "D9THCCBX"   Laboratory Chemistry Profile   Renal Lab Results  Component  Value Date   BUN 17 12/27/2020   CREATININE 0.97 12/27/2020   GFRAA >60 07/01/2019   GFRNONAA >60 12/27/2020    Hepatic Lab Results  Component Value Date   AST 21 12/27/2020   ALT 24 12/27/2020   ALBUMIN 3.8 12/27/2020   ALKPHOS 82 12/27/2020    Electrolytes Lab Results  Component Value Date   NA 132 (L) 12/27/2020   K 4.2 12/27/2020   CL 100 12/27/2020   CALCIUM 9.0 12/27/2020   MG 1.8 03/12/2015    Bone No results found for: "VD25OH", "VD125OH2TOT", "XF8182XH3", "ZJ6967EL3", "25OHVITD1", "25OHVITD2", "81OFBPZW2", "TESTOFREE", "TESTOSTERONE"  Inflammation (CRP: Acute Phase) (ESR: Chronic Phase) Lab Results  Component Value Date   CRP 0.6 03/12/2015   ESRSEDRATE 9 03/12/2015         Note: Above Lab results reviewed.  Imaging  DG PAIN CLINIC C-ARM 1-60 MIN NO REPORT Fluoro was used, but no Radiologist interpretation will be provided.  Please refer to "NOTES" tab for provider progress note.  Assessment  The primary encounter diagnosis was Elbow pain (Right). Diagnoses of Lateral epicondylitis of elbow (Right), Tennis elbow (Right), Chronic elbow pain (Right), Chronic forearm joint pain (Right), and Right elbow pain were also pertinent to this visit.  Plan of Care  Problem-specific:  No problem-specific Assessment & Plan notes found for this encounter.  Mr. Jaecob Lowden. has a current medication list which includes the following long-term medication(s): oxycodone-acetaminophen, [START ON 12/03/2021] oxycodone-acetaminophen, [START ON 01/02/2022] oxycodone-acetaminophen, and gabapentin.  Pharmacotherapy (Medications Ordered): No orders of the defined types were placed in this encounter.  Orders:  Orders Placed This Encounter  Procedures   MR FOREARM RIGHT WO CONTRAST    Standing Status:   Future    Standing Expiration Date:   11/17/2022    Order Specific Question:   What is the patient's sedation requirement?    Answer:   No Sedation    Order Specific  Question:   Does the patient have a pacemaker or implanted devices?    Answer:   No    Order Specific Question:   Preferred imaging location?    Answer:   [facility] (table limit - 550lbs)   MR ELBOW RIGHT WO CONTRAST    Standing Status:   Future    Standing Expiration Date:   11/17/2022    Order Specific Question:   What is the patient's sedation requirement?    Answer:   No Sedation    Order Specific Question:   Does the patient have a pacemaker or implanted devices?    Answer:   No    Order Specific Question:   Preferred imaging location?    Answer:   [facility] (table limit - 550lbs)   Follow-up plan:   Return for Eval-day (M,W), (VV), for review  of ordered tests (MRI results).     Interventional Therapies  Risk  Complexity Considerations:   Estimated body mass index is 38.74 kg/m as calculated from the following:   Height as of this encounter: _0  (1.676 m).   Weight as of this encounter: 240 lb (108.9 kg). WNL   Planned  Pending:      Under consideration:   Possible right Suprascapular nerve RFA  Possible left lumbar facet RFA  Diagnostic right knee genicular NB    Completed:   Diagnostic right caudal ESI x1 + epidurogram #1 (10/02/2019)  Palliative right lumbar facet MBB x4 (04/02/2019)  Palliative left lumbar facet MBB x2 (12/16/2015)  Palliative right lumbar facet RFA x1  (Right: 04/13/2016) (100% relief)  Diagnostic/therapeutic bilateral SI joint block x2 (11/17/2016)  Diagnostic right Suprascapular NB x1 (06/22/2016)    Therapeutic  Palliative (PRN) options:   Palliative lumbar facet block  Palliative right lumbar facet RFA #2  Diagnostic Suprascapular nerve Block         Recent Visits Date Type Provider Dept  11/02/21 Procedure visit Milinda Pointer, MD [facility]  10/25/21 Office Visit Milinda Pointer, MD [facility]  Showing recent visits within past 90 days and meeting all other requirements Today's  Visits Date Type Provider Dept  11/16/21 Office Visit Milinda Pointer, MD [facility]  Showing today's visits and meeting all other requirements Future Appointments Date Type Provider Dept  01/26/22 Appointment Milinda Pointer, MD [facility]  Showing future appointments within next 90 days and meeting all other requirements  I discussed the assessment and treatment plan with the patient. The patient was provided an opportunity to ask questions and all were answered. The patient agreed with the plan and demonstrated an understanding of the instructions.  Patient advised to call back or seek an in-person evaluation if the symptoms or condition worsens.  Duration of encounter: 25 minutes.  Note by: Gaspar Cola, MD Date: 11/16/2021; Time: 3:30 PM

## 2021-11-15 NOTE — Telephone Encounter (Signed)
LM for patient to call office for pre virtual appointment questions.  

## 2021-11-16 ENCOUNTER — Ambulatory Visit: Payer: BC Managed Care – PPO | Attending: Pain Medicine | Admitting: Pain Medicine

## 2021-11-16 DIAGNOSIS — G8929 Other chronic pain: Secondary | ICD-10-CM

## 2021-11-16 DIAGNOSIS — M25531 Pain in right wrist: Secondary | ICD-10-CM | POA: Insufficient documentation

## 2021-11-16 DIAGNOSIS — M7711 Lateral epicondylitis, right elbow: Secondary | ICD-10-CM

## 2021-11-16 DIAGNOSIS — M25521 Pain in right elbow: Secondary | ICD-10-CM

## 2021-11-16 NOTE — Patient Instructions (Signed)
  ____________________________________________________________________________________________  Patient Information update  To: All of our patients.  Re: Name change.  It has been made official that our current name, "[facility]"   will soon be changed to "[facility]".   The purpose of this change is to eliminate any confusion created by the concept of our practice being a "Medication Management Pain Clinic". In the past this has led to the misconception that we treat pain primarily by the use of prescription medications.  Nothing can be farther from the truth.   Understanding PAIN MANAGEMENT: To further understand what our practice does, you first have to understand that "Pain Management" is a subspecialty that requires additional training once a physician has completed their specialty training, which can be in either Anesthesia, Neurology, Psychiatry, or Physical Medicine and Rehabilitation (PMR). Each one of these contributes to the final approach taken by each physician to the management of their patient's pain. To be a "Pain Management Specialist" you must have first completed one of the specialty trainings below.  Anesthesiologists - trained in clinical pharmacology and interventional techniques such as nerve blockade and regional as well as central neuroanatomy. They are trained to block pain before, during, and after surgical interventions.  Neurologists - trained in the diagnosis and pharmacological treatment of complex neurological conditions, such as Multiple Sclerosis, Parkinson's, spinal cord injuries, and other systemic conditions that may be associated with symptoms that may include but are not limited to pain. They tend to rely primarily on the treatment of chronic pain using prescription medications.  Psychiatrist - trained in conditions affecting the psychosocial  wellbeing of patients including but not limited to depression, anxiety, schizophrenia, personality disorders, addiction, and other substance use disorders that may be associated with chronic pain. They tend to rely primarily on the treatment of chronic pain using prescription medications.   Physical Medicine and Rehabilitation (PMR) physicians, also known as physiatrists - trained to treat a wide variety of medical conditions affecting the brain, spinal cord, nerves, bones, joints, ligaments, muscles, and tendons. Their training is primarily aimed at treating patients that have suffered injuries that have caused severe physical impairment. Their training is primarily aimed at the physical therapy and rehabilitation of those patients. They may also work alongside orthopedic surgeons or neurosurgeons using their expertise in assisting surgical patients to recover after their surgeries.  INTERVENTIONAL PAIN MANAGEMENT is sub-subspecialty of Pain Management.  Our physicians are Board-certified in Anesthesia, Pain Management, and Interventional Pain Management.  This meaning that not only have they been trained and Board-certified in their specialty of Anesthesia, and subspecialty of Pain Management, but they have also received further training in the sub-subspecialty of Interventional Pain Management, in order to become Board-certified as INTERVENTIONAL PAIN MANAGEMENT SPECIALIST.    Mission: Our goal is to use our skills in  INTERVENTIONAL PAIN MANAGEMENT as alternatives to the chronic use of prescription opioid medications for the treatment of pain. To make this more clear, we have changed our name to reflect what we do and offer. We will continue to offer medication management assessment and recommendations, but we will not be taking over any patient's medication management.  ____________________________________________________________________________________________     

## 2021-11-17 ENCOUNTER — Telehealth: Payer: Self-pay

## 2021-11-17 ENCOUNTER — Other Ambulatory Visit (HOSPITAL_BASED_OUTPATIENT_CLINIC_OR_DEPARTMENT_OTHER): Payer: BC Managed Care – PPO | Admitting: Pain Medicine

## 2021-11-17 DIAGNOSIS — M7711 Lateral epicondylitis, right elbow: Secondary | ICD-10-CM

## 2021-11-17 DIAGNOSIS — M25521 Pain in right elbow: Secondary | ICD-10-CM

## 2021-11-17 DIAGNOSIS — M25531 Pain in right wrist: Secondary | ICD-10-CM

## 2021-11-17 DIAGNOSIS — G8929 Other chronic pain: Secondary | ICD-10-CM

## 2021-11-17 NOTE — Progress Notes (Signed)
The patient's insurance company has indicated that they need x-rays of the right elbow/forearm before an MRI can be approved.  Local anesthetic and steroid injection into the lateral epicondyle and area of the ulnar groove provided the patient with 100% relief of the pain for the duration of the local anesthetic but no long-term benefit.  MRI has been ordered to evaluate for possible soft tissue problems that may account for the patient's persistent chronic pain.

## 2021-11-17 NOTE — Telephone Encounter (Signed)
[employer] is requiring Xrays done on elbow and forearm before they will take the request for MRI. Do you want to order Xrays? Please let me know when order is put in.

## 2021-11-30 ENCOUNTER — Telehealth: Payer: Self-pay | Admitting: Pain Medicine

## 2021-11-30 NOTE — Telephone Encounter (Signed)
Patient was able to get the disc from [facility] but has not been able to get a report. He is willing to just go have new xrays made. Please ask Dr Laban Emperor to put in orders and someone call patient when they are ready

## 2021-12-01 NOTE — Telephone Encounter (Signed)
Adam Petty will call [facility] and request a copy of the report.

## 2021-12-07 NOTE — Telephone Encounter (Signed)
I have tried to reach [facility] to get report. Unable to obtain without signed consent. Patient is going to go back to their office and see if he can get a report.

## 2021-12-14 ENCOUNTER — Encounter: Payer: Self-pay | Admitting: Orthopedic Surgery

## 2021-12-14 ENCOUNTER — Other Ambulatory Visit: Payer: Self-pay | Admitting: Orthopedic Surgery

## 2021-12-14 DIAGNOSIS — Z01818 Encounter for other preprocedural examination: Secondary | ICD-10-CM

## 2021-12-14 NOTE — H&P (Signed)
Adam Petty. MRN:  [id number] DOB/SEX:  02-20-1960/male  CHIEF COMPLAINT:  Painful right Knee  HISTORY: Patient is a 61 y.o. male presented with a history of pain in the right knee. Onset of symptoms was gradual starting several years ago with gradually worsening course since that time. Prior procedures on the knee include none. Patient has been treated conservatively with over-the-counter NSAIDs and activity modification. Patient currently rates pain in the knee at 10 out of 10 with activity. There is no pain at night.  PAST MEDICAL HISTORY: Patient Active Problem List   Diagnosis Date Noted   Chronic elbow pain (Right) 11/16/2021   Chronic forearm joint pain (Right) 11/16/2021   Lateral epicondylitis of elbow (Right) 11/02/2021   Tennis elbow (Right) 11/02/2021   Elbow pain (Right) 05/31/2021   Greater trochanteric bursitis of right hip 05/31/2021   DDD (degenerative disc disease), cervical 04/20/2021   Cervical facet syndrome 04/20/2021   Cervicalgia 04/20/2021   Cervicogenic headache 04/20/2021   Inflammatory spondylopathy of lumbosacral region ([place]) 08/05/2020   Chronic use of opiate for therapeutic purpose 05/11/2020   Long term current use of non-steroidal anti-inflammatories (NSAID) 07/01/2019   Pharmacologic therapy 06/24/2019   Lumbosacral radiculopathy (S1) (Right) 04/17/2019   Spondylosis without myelopathy or radiculopathy, lumbosacral region 04/02/2019   DDD (degenerative disc disease), lumbosacral 04/02/2019   DDD (degenerative disc disease), thoracic 04/02/2019   Hepatitis C antibody test positive 11/20/2018   Hypertension associated with diabetes ([place]) 04/18/2018   B12 deficiency 09/08/2017   Chronic sacroiliac joint pain (Bilateral) (R>L) 08/25/2016   Osteoarthritis of glenohumeral joint (Right) 06/16/2016   Acute low back pain without sciatica 05/05/2016   Chronic pain syndrome (WC injury) 02/09/2016   Acute medial meniscal tear 03/25/2015   Chronic  knee pain (2ry area of Pain) (WC injury) (Right) 03/12/2015   Osteoarthritis of knee (Right) 03/12/2015   Chronic shoulder pain (3ry area of Pain) (Right) 03/12/2015   Osteoarthritis of shoulder (Right) 03/12/2015   Chronic lower extremity pain (Right) 03/12/2015   Recurrent nephrolithiasis 03/12/2015   Lumbar spondylosis 02/24/2015   Long term prescription opiate use 12/22/2014   Encounter for chronic pain management 12/22/2014   Neurogenic pain 12/22/2014   Neuropathic pain 12/22/2014   Musculoskeletal pain 12/22/2014   Chronic low back pain (WC injury) (1ry area of Pain) (Bilateral) (R>L) w/o sciatica 10/22/2014   Long term current use of opiate analgesic 10/22/2014   Encounter for therapeutic drug level monitoring 10/22/2014   Opiate use (30 MME/Day) 10/22/2014   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   Discogenic syndrome, lumbar (WC injury) 10/22/2014   Osteoarthritis of spine with radiculopathy, lumbar region (WC injury) 10/22/2014   Lumbar facet syndrome (WC injury) (Bilateral) (R>L) 10/22/2014   Lumbosacral radiculopathy (WC injury) [id number]   Uncomplicated opioid dependence ([place]) 10/22/2014   Therapeutic opioid-induced constipation (OIC) 10/22/2014   Lateral meniscal tear 10/01/2013   Microalbuminuria 09/12/2013   Type 2 diabetes mellitus with hyperglycemia, with long-term current use of insulin ([place]) 09/12/2013   Obesity, Class III, BMI 40-49.9 (morbid obesity) ([place]) 09/10/2013   Dyslipidemia 09/10/2013   Adiposity 09/10/2013   Hyperlipidemia due to type 2 diabetes mellitus ([place]) 09/10/2013   Spinal stenosis of lumbar region 02/06/2013   Lumbar canal stenosis 02/06/2013   Past Medical History:  Diagnosis Date   Arthritis, senescent 10/22/2014   Back pain    history spinal stenosis, DDD   Chronic pain syndrome 10/22/2014   Diabetes mellitus without complication ([place])  History of kidney stones    IBS (irritable bowel syndrome)    Lateral  meniscus tear    rt knee   Preventative health care    takes crestor/ altace for "preventative reasons" denies heart or BP problems   Sleep apnea    had UPPP surgery and now sleep apnea resolved   Spinal stenosis    Past Surgical History:  Procedure Laterality Date   CERVICAL FUSION     CTR Left 02/27/2018   CTR and trigger finger repair   CYSTOSCOPY W/ RETROGRADES Right 11/19/2015   Procedure: URETEROSCOPY WITH RETROGRADE PYELOGRAM;  Surgeon: Orson Ape, MD;  Location: [facility];  Service: Urology;  Laterality: Right;   CYSTOSCOPY W/ RETROGRADES Right 10/03/2017   Procedure: CYSTOSCOPY WITH RETROGRADE PYELOGRAM;  Surgeon: Orson Ape, MD;  Location: [facility];  Service: Urology;  Laterality: Right;   FOOT SURGERY     RT   KNEE ARTHROSCOPY Right 10/02/2013   Procedure: RIGHT KNEE ARTHROSCOPY, PARTIAL LATERAL MENISCECTOMY, DEBRIDEMENT, MEDIAL AND LATERAL CHONDROPLASTY;  Surgeon: Loanne Drilling, MD;  Location: [facility];  Service: Orthopedics;  Laterality: Right;   KNEE ARTHROSCOPY WITH MEDIAL MENISECTOMY Right 03/25/2015   Procedure: RIGHT KNEE ARTHROSCOPY WITH MENISCAL DEBRIDEMENT and chrodroplasty;  Surgeon: Ollen Gross, MD;  Location: [facility];  Service: Orthopedics;  Laterality: Right;   KNEE BURSECTOMY Right 03/25/2015   Procedure: RIGHT KNEE PREPATELLA BURSECTOMY;  Surgeon: Ollen Gross, MD;  Location: [facility];  Service: Orthopedics;  Laterality: Right;   LUMBAR LAMINECTOMY/DECOMPRESSION MICRODISCECTOMY N/A 02/06/2013   Procedure: MICRO LUMBAR DECOMPRESSION L3-L4 and L4 - L5 ;  Surgeon: Javier Docker, MD;  Location: [facility];  Service: Orthopedics;  Laterality: N/A;   PALATE SURGERY     surg for sleep apnea and sinus issues   SHOULDER SURGERY     x5 on RT / x1 LFT   TONSILLECTOMY     URETEROSCOPY Right 10/03/2017   Procedure: URETEROSCOPY;  Surgeon: Orson Ape, MD;  Location: [facility];  Service: Urology;  Laterality: Right;     MEDICATIONS:  (Not in a hospital  admission)   ALLERGIES:  No Known Allergies  REVIEW OF SYSTEMS:  Pertinent items are noted in HPI.   FAMILY HISTORY:   Family History  Problem Relation Age of Onset   Cancer Mother    Heart disease Mother    COPD Mother    Diabetes Father    Heart disease Father     SOCIAL HISTORY:   Social History   Tobacco Use   Smoking status: Never   Smokeless tobacco: Never  Substance Use Topics   Alcohol use: Yes    Comment: occasional     EXAMINATION:  Vital signs in last 24 hours: @VSRANGES @  General appearance: alert, cooperative, and no distress Neck: no JVD and supple, symmetrical, trachea midline Lungs: clear to auscultation bilaterally Heart: regular rate and rhythm, S1, S2 normal, no murmur, click, rub or gallop Abdomen: soft, non-tender; bowel sounds normal; no masses,  no organomegaly Extremities: extremities normal, atraumatic, no cyanosis or edema and Homans sign is negative, no sign of DVT Pulses: 2+ and symmetric Skin: Skin color, texture, turgor normal. No rashes or lesions Neurologic: Alert and oriented X 3, normal strength and tone. Normal symmetric reflexes. Normal coordination and gait  Musculoskeletal:  ROM 0-110, Ligaments intact,  Imaging Review Plain radiographs demonstrate severe degenerative joint disease of the right knee. The overall alignment is significant varus. The bone quality appears to be good  for age and reported activity level.  Assessment/Plan: Primary osteoarthritis, right knee   The patient history, physical examination and imaging studies are consistent with advanced degenerative joint disease of the right knee. The patient has failed conservative treatment.  The clearance notes were reviewed.  After discussion with the patient it was felt that Total Knee Replacement was indicated. The procedure,  risks, and benefits of total knee arthroplasty were presented and reviewed. The risks including but not limited to aseptic loosening,  infection, blood clots, vascular injury, stiffness, patella tracking problems complications among others were discussed. The patient acknowledged the explanation, agreed to proceed with the plan.  Carlynn Spry 12/14/2021, 10:14 AM

## 2021-12-14 NOTE — H&P (Deleted)
  The note originally documented on this encounter has been moved the the encounter in which it belongs.  

## 2021-12-15 ENCOUNTER — Ambulatory Visit
Admission: RE | Admit: 2021-12-15 | Discharge: 2021-12-15 | Disposition: A | Discharge status: Home / Self Care | Payer: BC Managed Care – PPO | Source: Ambulatory Visit | Attending: Pain Medicine | Admitting: Pain Medicine

## 2021-12-15 ENCOUNTER — Telehealth: Payer: Self-pay | Admitting: Pain Medicine

## 2021-12-15 ENCOUNTER — Ambulatory Visit
Admission: RE | Admit: 2021-12-15 | Discharge: 2021-12-15 | Disposition: A | Discharge status: Home / Self Care | Payer: BC Managed Care – PPO | Attending: Pain Medicine | Admitting: Pain Medicine

## 2021-12-15 DIAGNOSIS — M7711 Lateral epicondylitis, right elbow: Secondary | ICD-10-CM

## 2021-12-15 DIAGNOSIS — G8929 Other chronic pain: Secondary | ICD-10-CM

## 2021-12-15 DIAGNOSIS — M25521 Pain in right elbow: Secondary | ICD-10-CM

## 2021-12-15 DIAGNOSIS — M25531 Pain in right wrist: Secondary | ICD-10-CM

## 2021-12-15 NOTE — Telephone Encounter (Signed)
error 

## 2021-12-16 ENCOUNTER — Encounter
Admission: RE | Admit: 2021-12-16 | Discharge: 2021-12-16 | Disposition: A | Discharge status: Home / Self Care | Payer: BC Managed Care – PPO | Source: Ambulatory Visit | Attending: Orthopedic Surgery | Admitting: Orthopedic Surgery

## 2021-12-16 ENCOUNTER — Other Ambulatory Visit: Payer: BC Managed Care – PPO

## 2021-12-16 ENCOUNTER — Ambulatory Visit
Admission: RE | Admit: 2021-12-16 | Discharge: 2021-12-16 | Disposition: A | Discharge status: Home / Self Care | Payer: BC Managed Care – PPO | Source: Ambulatory Visit | Attending: Pain Medicine | Admitting: Pain Medicine

## 2021-12-16 DIAGNOSIS — I152 Hypertension secondary to endocrine disorders: Secondary | ICD-10-CM

## 2021-12-16 DIAGNOSIS — Z794 Long term (current) use of insulin: Secondary | ICD-10-CM | POA: Insufficient documentation

## 2021-12-16 DIAGNOSIS — G8929 Other chronic pain: Secondary | ICD-10-CM | POA: Diagnosis present

## 2021-12-16 DIAGNOSIS — Z01818 Encounter for other preprocedural examination: Secondary | ICD-10-CM | POA: Insufficient documentation

## 2021-12-16 DIAGNOSIS — M25521 Pain in right elbow: Secondary | ICD-10-CM | POA: Diagnosis not present

## 2021-12-16 DIAGNOSIS — E1159 Type 2 diabetes mellitus with other circulatory complications: Secondary | ICD-10-CM | POA: Diagnosis present

## 2021-12-16 DIAGNOSIS — M7711 Lateral epicondylitis, right elbow: Secondary | ICD-10-CM | POA: Insufficient documentation

## 2021-12-16 DIAGNOSIS — E1165 Type 2 diabetes mellitus with hyperglycemia: Secondary | ICD-10-CM | POA: Diagnosis present

## 2021-12-16 DIAGNOSIS — M25531 Pain in right wrist: Secondary | ICD-10-CM | POA: Insufficient documentation

## 2021-12-16 DIAGNOSIS — Z01812 Encounter for preprocedural laboratory examination: Secondary | ICD-10-CM

## 2021-12-16 HISTORY — DX: Type 2 diabetes mellitus without complications: E11.9

## 2021-12-16 HISTORY — DX: Unspecified osteoarthritis, unspecified site: M19.90

## 2021-12-16 HISTORY — DX: Hyperlipidemia, unspecified: E78.5

## 2021-12-16 HISTORY — DX: Essential (primary) hypertension: I10

## 2021-12-16 HISTORY — DX: Pure hypercholesterolemia, unspecified: E78.00

## 2021-12-16 LAB — CBC
HCT: 40.8 % (ref 39.0–52.0)
Hemoglobin: 13.5 g/dL (ref 13.0–17.0)
MCH: 29.5 pg (ref 26.0–34.0)
MCHC: 33.1 g/dL (ref 30.0–36.0)
MCV: 89.3 fL (ref 80.0–100.0)
Platelets: 211 10*3/uL (ref 150–400)
RBC: 4.57 MIL/uL (ref 4.22–5.81)
RDW: 12.8 % (ref 11.5–15.5)
WBC: 5.9 10*3/uL (ref 4.0–10.5)
nRBC: 0 % (ref 0.0–0.2)

## 2021-12-16 LAB — URINALYSIS, ROUTINE W REFLEX MICROSCOPIC
Bilirubin Urine: NEGATIVE
Glucose, UA: 50 mg/dL — AB
Hgb urine dipstick: NEGATIVE
Ketones, ur: NEGATIVE mg/dL
Leukocytes,Ua: NEGATIVE
Nitrite: NEGATIVE
Protein, ur: NEGATIVE mg/dL
Specific Gravity, Urine: 1.025 (ref 1.005–1.030)
pH: 5 (ref 5.0–8.0)

## 2021-12-16 LAB — BASIC METABOLIC PANEL
Anion gap: 8 (ref 5–15)
BUN: 21 mg/dL — ABNORMAL HIGH (ref 6–20)
CO2: 26 mmol/L (ref 22–32)
Calcium: 9.7 mg/dL (ref 8.9–10.3)
Chloride: 106 mmol/L (ref 98–111)
Creatinine, Ser: 1.11 mg/dL (ref 0.61–1.24)
GFR, Estimated: >60 mL/min (ref >60)
Glucose, Bld: 140 mg/dL — ABNORMAL HIGH (ref 70–99)
Potassium: 5.4 mmol/L — ABNORMAL HIGH (ref 3.5–5.1)
Sodium: 140 mmol/L (ref 135–145)

## 2021-12-16 LAB — TYPE AND SCREEN
ABO/RH(D): A POS
Antibody Screen: NEGATIVE

## 2021-12-16 LAB — SURGICAL PCR SCREEN
MRSA, PCR: NEGATIVE
Staphylococcus aureus: NEGATIVE

## 2021-12-16 NOTE — Progress Notes (Signed)
  [facility] Perioperative Services: Pre-Admission/Anesthesia Testing  Abnormal Lab Notification   Date: 12/16/21  Name: Jerren Flinchbaugh. MRN:   [id number]  Re: Abnormal labs noted during PAT appointment   Notified:  Provider Name Provider Role Notification Mode  Lyndle Herrlich, MD Orthopedics (Surgeon) Routed and/or faxed via Bobbe Medico, PA-C Orthopedics (APP) Routed and/or faxed via [facility]   Clinical Information and Notes:  ABNORMAL LAB VALUE(S): Lab Results  Component Value Date   K 5.4 (H) 12/16/2021   Steele Berg. is scheduled for a TOTAL KNEE ARTHROPLASTY (Right: Knee) on 12/28/2021. Order has been placed to have level rechecked on the last of his surgery to ensure that he is at a safe level to proceed with general anesthesia.   Quentin Mulling, MSN, APRN, FNP-C, CEN [facility]  Peri-operative Services Nurse Practitioner Phone: [phone] Fax: [phone] 12/16/21 12:11 PM

## 2021-12-16 NOTE — Patient Instructions (Addendum)
Your procedure is scheduled on: Tuesday, December 12 Report to the Registration Desk on the 1st floor of the [facility] To find out your arrival time, please call [phone] between 1PM - 3PM on: Monday, December 11 If your arrival time is 6:00 am, do not arrive prior to that time as the [facility] entrance doors do not open until 6:00 am.  REMEMBER: Instructions that are not followed completely may result in serious medical risk, up to and including death; or upon the discretion of your surgeon and anesthesiologist your surgery may need to be rescheduled.  Do not eat or drink after midnight the night before surgery.  No gum chewing, lozengers or hard candies.  TAKE THESE MEDICATIONS THE MORNING OF SURGERY WITH A SIP OF WATER:  Gabapentin Oxycodone if needed for pain  Metformin - hold for 2 days prior to surgery. Last day to take is Saturday, December 9. Resume AFTER surgery.  Ozempic - hold for 7 days prior to surgery. Last day to take is Sunday, December 3. Do NOT take Ozempic on Sunday, December 10. Resume Ozempic on Sunday, December 17.  One week prior to surgery: starting December 5 Stop Anti-inflammatories (NSAIDS) such as Advil, Aleve, Ibuprofen, Motrin, Naproxen, Naprosyn and Aspirin based products such as Excedrin, Goodys Powder, BC Powder. Stop ANY OVER THE COUNTER supplements until after surgery. You may however, continue to take Tylenol if needed for pain up until the day of surgery.  No Alcohol for 24 hours before or after surgery.  No Smoking including e-cigarettes for 24 hours prior to surgery.  No chewable tobacco products for at least 6 hours prior to surgery.  No nicotine patches on the day of surgery.  Do not use any "recreational" drugs for at least a week prior to your surgery.  Please be advised that the combination of cocaine and anesthesia may have negative outcomes, up to and including death. If you test positive for cocaine, your surgery will be  cancelled.  On the morning of surgery brush your teeth with toothpaste and water, you may rinse your mouth with mouthwash if you wish. Do not swallow any toothpaste or mouthwash.  Use CHG Soap as directed on instruction sheet.  Do not wear jewelry.  Do not wear lotions, powders, or perfumes.   Do not shave body from the neck down 48 hours prior to surgery just in case you cut yourself which could leave a site for infection.  Also, freshly shaved skin may become irritated if using the CHG soap.  Do not bring valuables to the hospital. [facility] is not responsible for any missing/lost belongings or valuables.   Notify your doctor if there is any change in your medical condition (cold, fever, infection).  Wear comfortable clothing (specific to your surgery type) to the hospital.  After surgery, you can help prevent lung complications by doing breathing exercises.  Take deep breaths and cough every 1-2 hours. Your doctor may order a device called an Incentive Spirometer to help you take deep breaths.  If you are being admitted to the hospital overnight, leave your suitcase in the car. After surgery it may be brought to your room.  If you are being discharged the day of surgery, you will not be allowed to drive home. You will need a responsible adult (18 years or older) to drive you home and stay with you that night.   If you are taking public transportation, you will need to have a responsible adult (18  years or older) with you. Please confirm with your physician that it is acceptable to use public transportation.   Please call the Pre-admissions Testing Dept. at [phone] if you have any questions about these instructions.  Surgery Visitation Policy:  Patients undergoing a surgery or procedure may have two family members or support persons with them as long as the person is not COVID-19 positive or experiencing its symptoms.   Inpatient Visitation:    Visiting hours are 7  a.m. to 8 p.m. Up to four visitors are allowed at one time in a patient room. The visitors may rotate out with other people during the day. One designated support person (adult) may remain overnight.  MASKING: Due to an increase in RSV rates and hospitalizations, in-patient care areas in which we serve newborns, infants and children, masks will be required for teammates and visitors.  Children ages 56 and under may not visit. This policy affects the following departments only:  [facility] Postpartum area Mother Baby Unit Newborn nursery/Special care nursery  Other areas: Masks continue to be strongly recommended for patient-facing teammates, visitors and patients in all other areas. Visitation is not restricted outside of the units listed above.     Preparing for Surgery with CHLORHEXIDINE GLUCONATE (CHG) Soap  Chlorhexidine Gluconate (CHG) Soap  o An antiseptic cleaner that kills germs and bonds with the skin to continue killing germs even after washing  o Used for showering the night before surgery and morning of surgery  Before surgery, you can play an important role by reducing the number of germs on your skin.  CHG (Chlorhexidine gluconate) soap is an antiseptic cleanser which kills germs and bonds with the skin to continue killing germs even after washing.  Please do not use if you have an allergy to CHG or antibacterial soaps. If your skin becomes reddened/irritated stop using the CHG.  1. Shower the NIGHT BEFORE SURGERY and the MORNING OF SURGERY with CHG soap.  2. If you choose to wash your hair, wash your hair first as usual with your normal shampoo.  3. After shampooing, rinse your hair and body thoroughly to remove the shampoo.  4. Use CHG as you would any other liquid soap. You can apply CHG directly to the skin and wash gently with a scrungie or a clean washcloth.  5. Apply the CHG soap to your body only from the neck down. Do not use on  open wounds or open sores. Avoid contact with your eyes, ears, mouth, and genitals (private parts). Wash face and genitals (private parts) with your normal soap.  6. Wash thoroughly, paying special attention to the area where your surgery will be performed.  7. Thoroughly rinse your body with warm water.  8. Do not shower/wash with your normal soap after using and rinsing off the CHG soap.  9. Pat yourself dry with a clean towel.  10. Wear clean pajamas to bed the night before surgery.  12. Place clean sheets on your bed the night of your first shower and do not sleep with pets.  13. Shower again with the CHG soap on the day of surgery prior to arriving at the hospital.  14. Do not apply any deodorants/lotions/powders.  15. Please wear clean clothes to the hospital.

## 2021-12-21 ENCOUNTER — Encounter: Payer: Self-pay | Admitting: Pain Medicine

## 2021-12-21 DIAGNOSIS — M19021 Primary osteoarthritis, right elbow: Secondary | ICD-10-CM | POA: Insufficient documentation

## 2021-12-21 NOTE — Progress Notes (Signed)
  Perioperative Services Pre-Admission/Anesthesia Testing    Date: 12/21/21  Name: Adam Petty. MRN:   [id number]  Re: GLP-1 clearance and provider recommendations   Planned Surgical Procedure(s):    Case: [id number] Date/Time: 12/28/21 0715   Procedure: TOTAL KNEE ARTHROPLASTY (Right: Knee)   Anesthesia type: Spinal   Pre-op diagnosis: M17.11 Unilateral primary osteoarthritis, right knee   Location: [facility] OR ROOM 08 / [facility]   Surgeons: Lyndle Herrlich, MD   Clinical Notes:  Patient is scheduled for the above procedure with the indicated provider/surgeon. In review of his medication reconciliation it was noted that patient is on a prescribed GLP-1 medication. Per guidelines issued by the American Society of Anesthesiologists (ASA), it is recommended that these medications be held for 7 days prior to the patient undergoing any type of elective surgical procedure. The patient is taking the following GLP-1 medication:  [x]  SEMAGLUTIDE   []  EXENATIDE  []  LIRAGLUTIDE   []  LIXISENATIDE  []  DULAGLUTIDE     []  OTHER GLP-1 medication: _______________  Reached out to prescribing provider , MD) to make them aware of the guidelines from anesthesia. Given that this patient takes the prescribed GLP-1 medication for his  diabetes diagnosis, rather than for weight loss, recommendations from the prescribing provider were solicited. Prescribing provider made aware of the following so that informed decision/POC can be developed for this patient that may be taking medications belonging to these drug classes:  Oral GLP-1 medications will be held 1 day prior to surgery.  Injectable GLP-1 medications will be held 7 days prior to surgery.  Metformin is routinely held 48 hours prior to surgery due to renal concerns, potential need for contrasted imaging perioperatively, and the potential for tissue hypoxia leading to drug induced lactic acidosis.  All SGLT2i  medications are held 72 hours prior to surgery as they can be associated with the increased potential for developing euglycemic diabetic ketoacidosis (EDKA).   Impression and Plan:  [name]. is on a prescribed GLP-1 medication, which induces the known side effect of decreased gastric emptying. Efforts are bring made to mitigate the risk of perioperative hyperglycemic events, as elevated blood glucose levels have been found to contribute to intra/postoperative complications. Additionally, hyperglycemic extremes can potentially necessitate the postponing of a patient's elective case in order to better optimize perioperative glycemic control, again with the aforementioned guidelines in place. With this in mind, recommendations have been sought from the prescribing provider, who has cleared patient to proceed with holding the prescribed GLP-1 as per the guidelines from the ASA.   Provider recommending: no further recommendations received from the prescribing provider.  Copy of signed clearance and recommendations placed on patient's chart for inclusion in their medical record and for review by the surgical/anesthetic team on the day of his procedure.   , MSN, APRN, FNP-C, CEN [facility]  Peri-operative Services Nurse Practitioner Phone: [phone] 12/21/21 2:59 PM  NOTE: This note has been prepared using Dragon dictation software. Despite my best ability to proofread, there is always the potential that unintentional transcriptional errors may still occur from this process.

## 2021-12-28 ENCOUNTER — Other Ambulatory Visit: Payer: Self-pay

## 2021-12-28 ENCOUNTER — Encounter: Payer: Self-pay | Admitting: Orthopedic Surgery

## 2021-12-28 ENCOUNTER — Encounter
Admission: RE | Disposition: A | Discharge status: Home / Self Care | Payer: Self-pay | Source: Home / Self Care | Attending: Orthopedic Surgery

## 2021-12-28 ENCOUNTER — Ambulatory Visit: Payer: BC Managed Care – PPO | Admitting: Urgent Care

## 2021-12-28 ENCOUNTER — Observation Stay
Admission: RE | Admit: 2021-12-28 | Discharge: 2021-12-29 | Disposition: A | Discharge status: Home / Self Care | Payer: BC Managed Care – PPO | Attending: Orthopedic Surgery | Admitting: Orthopedic Surgery

## 2021-12-28 ENCOUNTER — Ambulatory Visit: Payer: BC Managed Care – PPO

## 2021-12-28 DIAGNOSIS — E119 Type 2 diabetes mellitus without complications: Secondary | ICD-10-CM | POA: Diagnosis not present

## 2021-12-28 DIAGNOSIS — Z96651 Presence of right artificial knee joint: Secondary | ICD-10-CM | POA: Diagnosis not present

## 2021-12-28 DIAGNOSIS — M25561 Pain in right knee: Secondary | ICD-10-CM | POA: Diagnosis not present

## 2021-12-28 DIAGNOSIS — E875 Hyperkalemia: Secondary | ICD-10-CM

## 2021-12-28 DIAGNOSIS — M1711 Unilateral primary osteoarthritis, right knee: Principal | ICD-10-CM | POA: Insufficient documentation

## 2021-12-28 DIAGNOSIS — G8918 Other acute postprocedural pain: Secondary | ICD-10-CM | POA: Diagnosis not present

## 2021-12-28 DIAGNOSIS — Z01812 Encounter for preprocedural laboratory examination: Secondary | ICD-10-CM

## 2021-12-28 DIAGNOSIS — I10 Essential (primary) hypertension: Secondary | ICD-10-CM | POA: Insufficient documentation

## 2021-12-28 DIAGNOSIS — E1165 Type 2 diabetes mellitus with hyperglycemia: Secondary | ICD-10-CM

## 2021-12-28 DIAGNOSIS — I152 Hypertension secondary to endocrine disorders: Secondary | ICD-10-CM

## 2021-12-28 HISTORY — PX: TOTAL KNEE ARTHROPLASTY: SHX125

## 2021-12-28 LAB — ABO/RH: ABO/RH(D): A POS

## 2021-12-28 LAB — GLUCOSE, CAPILLARY
Glucose-Capillary: 170 mg/dL — ABNORMAL HIGH (ref 70–99)
Glucose-Capillary: 279 mg/dL — ABNORMAL HIGH (ref 70–99)
Glucose-Capillary: 301 mg/dL — ABNORMAL HIGH (ref 70–99)
Glucose-Capillary: 236 mg/dL — ABNORMAL HIGH (ref 70–99)

## 2021-12-28 SURGERY — ARTHROPLASTY, KNEE, TOTAL
Anesthesia: Regional | Site: Knee | Laterality: Right

## 2021-12-28 MED ORDER — FENTANYL CITRATE (PF) 100 MCG/2ML IJ SOLN
INTRAMUSCULAR | Status: AC
  Filled 2021-12-28: qty 2

## 2021-12-28 MED ORDER — BUPIVACAINE LIPOSOME 1.3 % IJ SUSP
INTRAMUSCULAR | Status: AC
  Filled 2021-12-28: qty 10

## 2021-12-28 MED ORDER — MELATONIN 5 MG PO TABS
ORAL_TABLET | ORAL | Status: AC
  Filled 2021-12-28: qty 2

## 2021-12-28 MED ORDER — ONDANSETRON HCL 4 MG/2ML IJ SOLN
INTRAMUSCULAR | Status: AC
  Filled 2021-12-28: qty 2

## 2021-12-28 MED ORDER — FENTANYL CITRATE (PF) 250 MCG/5ML IJ SOLN
INTRAMUSCULAR | Status: AC
  Filled 2021-12-28: qty 5

## 2021-12-28 MED ORDER — OXYCODONE HCL 5 MG/5ML PO SOLN: 5.0000 mg | Freq: Once | ORAL | Status: AC | PRN

## 2021-12-28 MED ORDER — KETOROLAC TROMETHAMINE 15 MG/ML IJ SOLN
INTRAMUSCULAR | Status: AC
  Administered 2021-12-28: 15 mg via INTRAVENOUS
  Filled 2021-12-28: qty 1

## 2021-12-28 MED ORDER — LIDOCAINE HCL (CARDIAC) PF 100 MG/5ML IV SOSY
PREFILLED_SYRINGE | INTRAVENOUS | Status: DC | PRN
  Administered 2021-12-28: 100 mg via INTRAVENOUS

## 2021-12-28 MED ORDER — KETOROLAC TROMETHAMINE 15 MG/ML IJ SOLN
15.0000 mg | Freq: Four times a day (QID) | INTRAMUSCULAR | Status: AC
  Administered 2021-12-28 – 2021-12-29 (×3): 15 mg via INTRAVENOUS

## 2021-12-28 MED ORDER — DEXAMETHASONE SODIUM PHOSPHATE 10 MG/ML IJ SOLN
INTRAMUSCULAR | Status: AC
  Filled 2021-12-28: qty 1

## 2021-12-28 MED ORDER — ROCURONIUM BROMIDE 10 MG/ML (PF) SYRINGE
PREFILLED_SYRINGE | INTRAVENOUS | Status: AC
  Filled 2021-12-28: qty 10

## 2021-12-28 MED ORDER — MIDAZOLAM HCL 2 MG/2ML IJ SOLN
INTRAMUSCULAR | Status: AC
  Administered 2021-12-28: 1 mg via INTRAVENOUS
  Filled 2021-12-28: qty 2

## 2021-12-28 MED ORDER — TRANEXAMIC ACID-NACL 1000-0.7 MG/100ML-% IV SOLN
1000.0000 mg | INTRAVENOUS | Status: AC
  Administered 2021-12-28: 1000 mg via INTRAVENOUS

## 2021-12-28 MED ORDER — MELATONIN 5 MG PO TABS
10.0000 mg | ORAL_TABLET | Freq: Every evening | ORAL | Status: DC | PRN
  Administered 2021-12-28: 10 mg via ORAL

## 2021-12-28 MED ORDER — ALUM & MAG HYDROXIDE-SIMETH 200-200-20 MG/5ML PO SUSP: 30.0000 mL | ORAL | Status: DC | PRN

## 2021-12-28 MED ORDER — RAMIPRIL 5 MG PO CAPS
5.0000 mg | ORAL_CAPSULE | Freq: Every day | ORAL | Status: DC
  Administered 2021-12-29: 5 mg via ORAL
  Filled 2021-12-28 (×2): qty 1

## 2021-12-28 MED ORDER — FENTANYL CITRATE PF 50 MCG/ML IJ SOSY: 50.0000 ug | PREFILLED_SYRINGE | Freq: Once | INTRAMUSCULAR | Status: AC

## 2021-12-28 MED ORDER — HYDROMORPHONE HCL 1 MG/ML IJ SOLN
0.5000 mg | INTRAMUSCULAR | Status: DC | PRN
  Administered 2021-12-28: 0.5 mg via INTRAVENOUS
  Administered 2021-12-29: 1 mg via INTRAVENOUS

## 2021-12-28 MED ORDER — FAMOTIDINE 20 MG PO TABS: 20.0000 mg | ORAL_TABLET | Freq: Once | ORAL | Status: AC

## 2021-12-28 MED ORDER — BUPIVACAINE HCL (PF) 0.5 % IJ SOLN
INTRAMUSCULAR | Status: DC | PRN
  Administered 2021-12-28: 10 mL via PERINEURAL

## 2021-12-28 MED ORDER — METFORMIN HCL 500 MG PO TABS
ORAL_TABLET | ORAL | Status: AC
  Administered 2021-12-28: 1000 mg via ORAL
  Filled 2021-12-28: qty 2

## 2021-12-28 MED ORDER — ASPIRIN 81 MG PO CHEW
CHEWABLE_TABLET | ORAL | Status: AC
  Administered 2021-12-28: 81 mg
  Filled 2021-12-28: qty 1

## 2021-12-28 MED ORDER — MIDAZOLAM HCL 2 MG/2ML IJ SOLN: 1.0000 mg | Freq: Once | INTRAMUSCULAR | Status: AC

## 2021-12-28 MED ORDER — CEFAZOLIN SODIUM-DEXTROSE 2-4 GM/100ML-% IV SOLN
INTRAVENOUS | Status: AC
  Filled 2021-12-28: qty 100

## 2021-12-28 MED ORDER — CHLORHEXIDINE GLUCONATE 0.12 % MT SOLN
OROMUCOSAL | Status: AC
  Administered 2021-12-28: 15 mL via OROMUCOSAL
  Filled 2021-12-28: qty 15

## 2021-12-28 MED ORDER — OXYCODONE HCL 5 MG PO TABS
ORAL_TABLET | ORAL | Status: AC
  Filled 2021-12-28: qty 1

## 2021-12-28 MED ORDER — DOCUSATE SODIUM 100 MG PO CAPS
ORAL_CAPSULE | ORAL | Status: AC
  Filled 2021-12-28: qty 1

## 2021-12-28 MED ORDER — HYDROMORPHONE HCL 1 MG/ML IJ SOLN
INTRAMUSCULAR | Status: AC
  Filled 2021-12-28: qty 1

## 2021-12-28 MED ORDER — HYDROMORPHONE HCL 1 MG/ML IJ SOLN
0.5000 mg | INTRAMUSCULAR | Status: AC | PRN
  Administered 2021-12-28 (×4): 0.5 mg via INTRAVENOUS

## 2021-12-28 MED ORDER — OXYCODONE HCL 5 MG PO TABS
5.0000 mg | ORAL_TABLET | Freq: Once | ORAL | Status: AC | PRN
  Administered 2021-12-28: 5 mg via ORAL

## 2021-12-28 MED ORDER — FENTANYL CITRATE (PF) 100 MCG/2ML IJ SOLN
INTRAMUSCULAR | Status: DC | PRN
  Administered 2021-12-28: 50 ug via INTRAVENOUS

## 2021-12-28 MED ORDER — FENTANYL CITRATE (PF) 100 MCG/2ML IJ SOLN
25.0000 ug | INTRAMUSCULAR | Status: DC | PRN
  Administered 2021-12-28 (×4): 50 ug via INTRAVENOUS

## 2021-12-28 MED ORDER — DIPHENHYDRAMINE HCL 12.5 MG/5ML PO ELIX: 12.5000 mg | ORAL_SOLUTION | ORAL | Status: DC | PRN

## 2021-12-28 MED ORDER — OXYCODONE HCL 5 MG PO TABS
ORAL_TABLET | ORAL | Status: AC
  Filled 2021-12-28: qty 10

## 2021-12-28 MED ORDER — ACETAMINOPHEN 325 MG PO TABS: 325.0000 mg | ORAL_TABLET | Freq: Four times a day (QID) | ORAL | Status: DC | PRN

## 2021-12-28 MED ORDER — BUPIVACAINE LIPOSOME 1.3 % IJ SUSP
INTRAMUSCULAR | Status: DC | PRN
  Administered 2021-12-28: 10 mL via PERINEURAL

## 2021-12-28 MED ORDER — BUPIVACAINE LIPOSOME 1.3 % IJ SUSP
INTRAMUSCULAR | Status: AC
  Filled 2021-12-28: qty 20

## 2021-12-28 MED ORDER — MENTHOL 3 MG MT LOZG: 1.0000 | LOZENGE | OROMUCOSAL | Status: DC | PRN

## 2021-12-28 MED ORDER — CEFAZOLIN SODIUM-DEXTROSE 2-4 GM/100ML-% IV SOLN
2.0000 g | INTRAVENOUS | Status: AC
  Administered 2021-12-28: 2 g via INTRAVENOUS

## 2021-12-28 MED ORDER — BUPIVACAINE HCL (PF) 0.5 % IJ SOLN
INTRAMUSCULAR | Status: AC
  Filled 2021-12-28: qty 10

## 2021-12-28 MED ORDER — LIDOCAINE HCL (PF) 2 % IJ SOLN
INTRAMUSCULAR | Status: AC
  Filled 2021-12-28: qty 5

## 2021-12-28 MED ORDER — DEXMEDETOMIDINE HCL IN NACL 80 MCG/20ML IV SOLN
INTRAVENOUS | Status: DC | PRN
  Administered 2021-12-28: 12 ug via BUCCAL
  Administered 2021-12-28: 8 ug via BUCCAL

## 2021-12-28 MED ORDER — KETOROLAC TROMETHAMINE 15 MG/ML IJ SOLN
INTRAMUSCULAR | Status: AC
  Filled 2021-12-28: qty 1

## 2021-12-28 MED ORDER — OXYCODONE HCL 5 MG PO TABS: 10.0000 mg | ORAL_TABLET | ORAL | Status: DC | PRN

## 2021-12-28 MED ORDER — SODIUM CHLORIDE 0.9 % IR SOLN
Status: DC | PRN
  Administered 2021-12-28: 3000 mL

## 2021-12-28 MED ORDER — BUPIVACAINE-EPINEPHRINE (PF) 0.5% -1:200000 IJ SOLN
INTRAMUSCULAR | Status: AC
  Filled 2021-12-28: qty 30

## 2021-12-28 MED ORDER — DOCUSATE SODIUM 100 MG PO CAPS
100.0000 mg | ORAL_CAPSULE | Freq: Two times a day (BID) | ORAL | Status: DC
  Administered 2021-12-28 – 2021-12-29 (×2): 100 mg via ORAL

## 2021-12-28 MED ORDER — POVIDONE-IODINE 10 % EX SWAB
2.0000 | Freq: Once | CUTANEOUS | Status: AC
  Administered 2021-12-28: 2 via TOPICAL

## 2021-12-28 MED ORDER — DEXAMETHASONE SODIUM PHOSPHATE 10 MG/ML IJ SOLN
INTRAMUSCULAR | Status: DC | PRN
  Administered 2021-12-28: 10 mg via INTRAVENOUS

## 2021-12-28 MED ORDER — LACTATED RINGERS IV SOLN: INTRAVENOUS | Status: DC

## 2021-12-28 MED ORDER — SODIUM CHLORIDE (PF) 0.9 % IJ SOLN
INTRAMUSCULAR | Status: DC | PRN
  Administered 2021-12-28: 80 mL via INTRAMUSCULAR

## 2021-12-28 MED ORDER — INSULIN ASPART 100 UNIT/ML IJ SOLN
INTRAMUSCULAR | Status: AC
  Administered 2021-12-28: 5 [IU] via SUBCUTANEOUS
  Filled 2021-12-28: qty 1

## 2021-12-28 MED ORDER — 0.9 % SODIUM CHLORIDE (POUR BTL) OPTIME
TOPICAL | Status: DC | PRN
  Administered 2021-12-28: 1000 mL

## 2021-12-28 MED ORDER — FENTANYL CITRATE PF 50 MCG/ML IJ SOSY
PREFILLED_SYRINGE | INTRAMUSCULAR | Status: AC
  Administered 2021-12-28: 50 ug via INTRAVENOUS
  Filled 2021-12-28: qty 1

## 2021-12-28 MED ORDER — ONDANSETRON HCL 4 MG PO TABS: 4.0000 mg | ORAL_TABLET | Freq: Four times a day (QID) | ORAL | Status: DC | PRN

## 2021-12-28 MED ORDER — MAGNESIUM CITRATE PO SOLN: 1.0000 | Freq: Once | ORAL | Status: DC | PRN

## 2021-12-28 MED ORDER — METOCLOPRAMIDE HCL 5 MG PO TABS: 5.0000 mg | ORAL_TABLET | Freq: Three times a day (TID) | ORAL | Status: DC | PRN

## 2021-12-28 MED ORDER — SUCCINYLCHOLINE CHLORIDE 200 MG/10ML IV SOSY
PREFILLED_SYRINGE | INTRAVENOUS | Status: DC | PRN
  Administered 2021-12-28: 200 mg via INTRAVENOUS

## 2021-12-28 MED ORDER — ROSUVASTATIN CALCIUM 5 MG PO TABS
5.0000 mg | ORAL_TABLET | ORAL | Status: DC
  Administered 2021-12-29: 5 mg via ORAL
  Filled 2021-12-28: qty 1

## 2021-12-28 MED ORDER — HYDROMORPHONE HCL 1 MG/ML IJ SOLN
INTRAMUSCULAR | Status: AC
  Filled 2021-12-28: qty 0.5

## 2021-12-28 MED ORDER — STERILE WATER FOR IRRIGATION IR SOLN
Status: DC | PRN
  Administered 2021-12-28: 1000 mL

## 2021-12-28 MED ORDER — ORAL CARE MOUTH RINSE: 15.0000 mL | Freq: Once | OROMUCOSAL | Status: AC

## 2021-12-28 MED ORDER — ONDANSETRON HCL 4 MG/2ML IJ SOLN
INTRAMUSCULAR | Status: DC | PRN
  Administered 2021-12-28: 4 mg via INTRAVENOUS

## 2021-12-28 MED ORDER — BISACODYL 10 MG RE SUPP: 10.0000 mg | Freq: Every day | RECTAL | Status: DC | PRN

## 2021-12-28 MED ORDER — ONDANSETRON HCL 4 MG/2ML IJ SOLN: 4.0000 mg | Freq: Four times a day (QID) | INTRAMUSCULAR | Status: DC | PRN

## 2021-12-28 MED ORDER — PHENOL 1.4 % MT LIQD: 1.0000 | OROMUCOSAL | Status: DC | PRN

## 2021-12-28 MED ORDER — CEFAZOLIN SODIUM-DEXTROSE 2-4 GM/100ML-% IV SOLN
2.0000 g | Freq: Four times a day (QID) | INTRAVENOUS | Status: AC
  Administered 2021-12-28: 2 g via INTRAVENOUS

## 2021-12-28 MED ORDER — METFORMIN HCL 500 MG PO TABS
1000.0000 mg | ORAL_TABLET | Freq: Two times a day (BID) | ORAL | Status: DC
  Administered 2021-12-29: 1000 mg via ORAL
  Filled 2021-12-28: qty 2

## 2021-12-28 MED ORDER — ASPIRIN 81 MG PO CHEW
81.0000 mg | CHEWABLE_TABLET | Freq: Two times a day (BID) | ORAL | Status: DC
  Administered 2021-12-29: 81 mg via ORAL

## 2021-12-28 MED ORDER — OXYCODONE HCL 5 MG PO TABS
5.0000 mg | ORAL_TABLET | ORAL | Status: DC | PRN
  Administered 2021-12-28: 10 mg via ORAL
  Administered 2021-12-28: 5 mg via ORAL
  Administered 2021-12-29: 10 mg via ORAL

## 2021-12-28 MED ORDER — PROPOFOL 10 MG/ML IV BOLUS
INTRAVENOUS | Status: AC
  Filled 2021-12-28: qty 40

## 2021-12-28 MED ORDER — IRRISEPT - 450ML BOTTLE WITH 0.05% CHG IN STERILE WATER, USP 99.95% OPTIME
TOPICAL | Status: DC | PRN
  Administered 2021-12-28: 450 mL

## 2021-12-28 MED ORDER — SODIUM CHLORIDE 0.9 % IV SOLN: INTRAVENOUS | Status: DC

## 2021-12-28 MED ORDER — ACETAMINOPHEN 10 MG/ML IV SOLN
INTRAVENOUS | Status: AC
  Filled 2021-12-28: qty 100

## 2021-12-28 MED ORDER — SODIUM CHLORIDE FLUSH 0.9 % IV SOLN
INTRAVENOUS | Status: AC
  Filled 2021-12-28: qty 40

## 2021-12-28 MED ORDER — MIDAZOLAM HCL 2 MG/2ML IJ SOLN
INTRAMUSCULAR | Status: AC
  Filled 2021-12-28: qty 2

## 2021-12-28 MED ORDER — PHENYLEPHRINE 80 MCG/ML (10ML) SYRINGE FOR IV PUSH (FOR BLOOD PRESSURE SUPPORT)
PREFILLED_SYRINGE | INTRAVENOUS | Status: AC
  Filled 2021-12-28: qty 10

## 2021-12-28 MED ORDER — ROCURONIUM BROMIDE 100 MG/10ML IV SOLN
INTRAVENOUS | Status: DC | PRN
  Administered 2021-12-28: 50 mg via INTRAVENOUS

## 2021-12-28 MED ORDER — FAMOTIDINE 20 MG PO TABS
ORAL_TABLET | ORAL | Status: AC
  Administered 2021-12-28: 20 mg via ORAL
  Filled 2021-12-28: qty 1

## 2021-12-28 MED ORDER — CEFAZOLIN SODIUM-DEXTROSE 2-4 GM/100ML-% IV SOLN
INTRAVENOUS | Status: AC
  Administered 2021-12-28: 2 g via INTRAVENOUS
  Filled 2021-12-28: qty 100

## 2021-12-28 MED ORDER — PHENYLEPHRINE 80 MCG/ML (10ML) SYRINGE FOR IV PUSH (FOR BLOOD PRESSURE SUPPORT)
PREFILLED_SYRINGE | INTRAVENOUS | Status: DC | PRN
  Administered 2021-12-28 (×5): 80 ug via INTRAVENOUS

## 2021-12-28 MED ORDER — INSULIN ASPART 100 UNIT/ML IJ SOLN
INTRAMUSCULAR | Status: AC
  Filled 2021-12-28: qty 1

## 2021-12-28 MED ORDER — TRANEXAMIC ACID-NACL 1000-0.7 MG/100ML-% IV SOLN
INTRAVENOUS | Status: AC
  Filled 2021-12-28: qty 100

## 2021-12-28 MED ORDER — CHLORHEXIDINE GLUCONATE 0.12 % MT SOLN: 15.0000 mL | Freq: Once | OROMUCOSAL | Status: AC

## 2021-12-28 MED ORDER — INSULIN ASPART 100 UNIT/ML IJ SOLN
0.0000 [IU] | Freq: Three times a day (TID) | INTRAMUSCULAR | Status: DC
  Administered 2021-12-28: 8 [IU] via SUBCUTANEOUS
  Administered 2021-12-29: 3 [IU] via SUBCUTANEOUS

## 2021-12-28 MED ORDER — SUGAMMADEX SODIUM 500 MG/5ML IV SOLN
INTRAVENOUS | Status: AC
  Filled 2021-12-28: qty 5

## 2021-12-28 MED ORDER — SUGAMMADEX SODIUM 500 MG/5ML IV SOLN
INTRAVENOUS | Status: DC | PRN
  Administered 2021-12-28: 250 mg via INTRAVENOUS

## 2021-12-28 MED ORDER — ACETAMINOPHEN 10 MG/ML IV SOLN
INTRAVENOUS | Status: DC | PRN
  Administered 2021-12-28: 1000 mg via INTRAVENOUS

## 2021-12-28 MED ORDER — PROPOFOL 10 MG/ML IV BOLUS
INTRAVENOUS | Status: DC | PRN
  Administered 2021-12-28: 200 mg via INTRAVENOUS

## 2021-12-28 MED ORDER — METOCLOPRAMIDE HCL 5 MG/ML IJ SOLN: 5.0000 mg | Freq: Three times a day (TID) | INTRAMUSCULAR | Status: DC | PRN

## 2021-12-28 SURGICAL SUPPLY — 60 items
APL PRP STRL LF DISP 70% ISPRP (MISCELLANEOUS) ×2
BASEPLATE TIBIAL RT SZ 5 (Knees) IMPLANT
BLADE SAGITTAL AGGR TOOTH XLG (BLADE) ×1 IMPLANT
BLADE SAW SAG 25X90X1.19 (BLADE) ×1 IMPLANT
BOWL CEMENT MIX W/ADAPTER (MISCELLANEOUS) ×1 IMPLANT
BRUSH SCRUB EZ  4% CHG (MISCELLANEOUS) ×1
BRUSH SCRUB EZ 4% CHG (MISCELLANEOUS) ×1 IMPLANT
BSPLAT TIB 5 CMNT M TPR KN RT (Knees) ×1 IMPLANT
CEMENT BONE 40GM (Cement) ×2 IMPLANT
CHLORAPREP W/TINT 26 (MISCELLANEOUS) ×2 IMPLANT
COMP PATELLA GENESIS 35MM (Stem) ×1 IMPLANT
COMPONENT PATELLA GENESIS 35MM (Stem) IMPLANT
COOLER POLAR GLACIER W/PUMP (MISCELLANEOUS) ×1 IMPLANT
DRAPE 3/4 80X56 (DRAPES) ×2 IMPLANT
DRAPE INCISE IOBAN 66X60 STRL (DRAPES) ×1 IMPLANT
ELECT REM PT RETURN 9FT ADLT (ELECTROSURGICAL) ×1
ELECTRODE REM PT RTRN 9FT ADLT (ELECTROSURGICAL) ×1 IMPLANT
FEM COMP OXINIUM 5N RT NARROW (Orthopedic Implant) ×1 IMPLANT
FEMORAL COMP OXINIUM 5N RT NRW (Orthopedic Implant) IMPLANT
GAUZE PAD ABD 8X10 STRL (GAUZE/BANDAGES/DRESSINGS) ×2 IMPLANT
GAUZE SPONGE 4X4 12PLY STRL (GAUZE/BANDAGES/DRESSINGS) ×1 IMPLANT
GAUZE XEROFORM 1X8 LF (GAUZE/BANDAGES/DRESSINGS) ×1 IMPLANT
GLOVE BIO SURGEON STRL SZ8 (GLOVE) ×2 IMPLANT
GLOVE BIOGEL PI IND STRL 8.5 (GLOVE) ×2 IMPLANT
GLOVE PI ORTHO PRO STRL SZ8 (GLOVE) ×2 IMPLANT
GLOVE SURG ORTHO 8.5 STRL (GLOVE) IMPLANT
GOWN STRL REUS W/ TWL XL LVL3 (GOWN DISPOSABLE) ×2 IMPLANT
GOWN STRL REUS W/TWL XL LVL3 (GOWN DISPOSABLE) ×2
HOOD PEEL AWAY T7 (MISCELLANEOUS) ×3 IMPLANT
INSERT TIB XLPE 9 SZ 5-6 (Insert) IMPLANT
IV NS IRRIG 3000ML ARTHROMATIC (IV SOLUTION) ×1 IMPLANT
JET LAVAGE IRRISEPT WOUND (IRRIGATION / IRRIGATOR) ×1
KIT TURNOVER KIT A (KITS) ×1 IMPLANT
LAVAGE JET IRRISEPT WOUND (IRRIGATION / IRRIGATOR) IMPLANT
MANIFOLD NEPTUNE II (INSTRUMENTS) ×1 IMPLANT
MAT ABSORB  FLUID 56X50 GRAY (MISCELLANEOUS) ×1
MAT ABSORB FLUID 56X50 GRAY (MISCELLANEOUS) ×1 IMPLANT
NDL SAFETY ECLIP 18X1.5 (MISCELLANEOUS) ×1 IMPLANT
NDL SPNL 20GX3.5 QUINCKE YW (NEEDLE) ×1 IMPLANT
NEEDLE SPNL 20GX3.5 QUINCKE YW (NEEDLE) ×1 IMPLANT
NS IRRIG 1000ML POUR BTL (IV SOLUTION) ×1 IMPLANT
PACK TOTAL KNEE (MISCELLANEOUS) ×1 IMPLANT
PAD DE MAYO PRESSURE PROTECT (MISCELLANEOUS) ×1 IMPLANT
PAD WRAPON POLAR KNEE (MISCELLANEOUS) ×1 IMPLANT
PULSAVAC PLUS IRRIG FAN TIP (DISPOSABLE) ×1
STAPLER SKIN PROX 35W (STAPLE) ×1 IMPLANT
SUCTION FRAZIER HANDLE 10FR (MISCELLANEOUS) ×1
SUCTION TUBE FRAZIER 10FR DISP (MISCELLANEOUS) ×1 IMPLANT
SUT DVC 2 QUILL PDO  T11 36X36 (SUTURE) ×1
SUT DVC 2 QUILL PDO T11 36X36 (SUTURE) ×1 IMPLANT
SUT VIC AB 2-0 CT1 18 (SUTURE) ×1 IMPLANT
SUT VIC AB 2-0 CT1 27 (SUTURE)
SUT VIC AB 2-0 CT1 TAPERPNT 27 (SUTURE) IMPLANT
SUT VIC AB PLUS 45CM 1-MO-4 (SUTURE) ×1 IMPLANT
SYR 30ML LL (SYRINGE) ×3 IMPLANT
TIBIAL BASEPLATE SZ 5 (Knees) ×1 IMPLANT
TIP FAN IRRIG PULSAVAC PLUS (DISPOSABLE) ×1 IMPLANT
TRAP FLUID SMOKE EVACUATOR (MISCELLANEOUS) ×1 IMPLANT
WATER STERILE IRR 500ML POUR (IV SOLUTION) ×1 IMPLANT
WRAPON POLAR PAD KNEE (MISCELLANEOUS) ×1

## 2021-12-28 NOTE — Anesthesia Preprocedure Evaluation (Signed)
Anesthesia Evaluation  Patient identified by MRN, date of birth, ID band Patient awake    Reviewed: Allergy & Precautions, NPO status , Patient's Chart, lab work & pertinent test results  History of Anesthesia Complications Negative for: history of anesthetic complications  Airway Mallampati: III  TM Distance: <3 FB Neck ROM: full    Dental  (+) Chipped   Pulmonary neg shortness of breath, sleep apnea    Pulmonary exam normal        Cardiovascular Exercise Tolerance: Good hypertension, (-) angina (-) Past MI Normal cardiovascular exam     Neuro/Psych  Headaches  Neuromuscular disease  negative psych ROS   GI/Hepatic negative GI ROS, Neg liver ROS,,,  Endo/Other  negative endocrine ROSdiabetes    Renal/GU Renal disease     Musculoskeletal   Abdominal   Peds  Hematology negative hematology ROS (+)   Anesthesia Other Findings Past Medical History: 10/22/2014: Arthritis, senescent No date: Back pain     Comment:  history spinal stenosis, DDD 10/22/2014: Chronic pain syndrome No date: DJD (degenerative joint disease) No date: Dyslipidemia No date: Essential hypertension No date: History of kidney stones No date: Hypercholesterolemia No date: IBS (irritable bowel syndrome) No date: Lateral meniscus tear     Comment:  rt knee No date: Osteoarthritis No date: Preventative health care     Comment:  takes crestor/ altace for "preventative reasons" denies               heart or BP problems No date: Sleep apnea     Comment:  had UPPP surgery and now sleep apnea resolved No date: Spinal stenosis No date: Type 2 diabetes mellitus without complication (HCC)  Past Surgical History: 1995: CERVICAL FUSION 01/24/2003: COLONOSCOPY 01/16/2018: COLONOSCOPY 02/27/2018: CTR; Left     Comment:  CTR and trigger finger repair 11/19/2015: CYSTOSCOPY W/ RETROGRADES; Right     Comment:  Procedure: URETEROSCOPY WITH RETROGRADE  PYELOGRAM;                Surgeon: Orson Ape, MD;  Location: [facility];                Service: Urology;  Laterality: Right; 10/03/2017: CYSTOSCOPY W/ RETROGRADES; Right     Comment:  Procedure: CYSTOSCOPY WITH RETROGRADE PYELOGRAM;                Surgeon: Orson Ape, MD;  Location: [facility];                Service: Urology;  Laterality: Right; No date: FOOT SURGERY     Comment:  RT 10/02/2013: KNEE ARTHROSCOPY; Right     Comment:  Procedure: RIGHT KNEE ARTHROSCOPY, PARTIAL LATERAL               MENISCECTOMY, DEBRIDEMENT, MEDIAL AND LATERAL               CHONDROPLASTY;  Surgeon: Loanne Drilling, MD;  Location:               [facility];  Service: Orthopedics;  Laterality: Right; 03/25/2015: KNEE ARTHROSCOPY WITH MEDIAL MENISECTOMY; Right     Comment:  Procedure: RIGHT KNEE ARTHROSCOPY WITH MENISCAL               DEBRIDEMENT and chrodroplasty;  Surgeon: Ollen Gross,               MD;  Location: [facility];  Service: Orthopedics;  Laterality: Right; 03/25/2015: KNEE BURSECTOMY; Right     Comment:  Procedure: RIGHT KNEE PREPATELLA BURSECTOMY;  Surgeon:               Ollen Gross, MD;  Location: [facility];  Service:               Orthopedics;  Laterality: Right; 02/06/2013: LUMBAR LAMINECTOMY/DECOMPRESSION MICRODISCECTOMY; N/A     Comment:  Procedure: MICRO LUMBAR DECOMPRESSION L3-L4 and L4 - L5               ;  Surgeon: Javier Docker, MD;  Location: [facility];                Service: Orthopedics;  Laterality: N/A; No date: PALATE SURGERY     Comment:  surg for sleep apnea and sinus issues No date: ROTATOR CUFF REPAIR; Right     Comment:  x3 No date: ROTATOR CUFF REPAIR; Left No date: SHOULDER SURGERY; Right     Comment:  spur removal x2 No date: TONSILLECTOMY 10/03/2017: URETEROSCOPY; Right     Comment:  Procedure: URETEROSCOPY;  Surgeon: Orson Ape, MD;              Location: [facility];  Service: Urology;  Laterality:               Right;  BMI    Body Mass  Index: 38.25 kg/m      Reproductive/Obstetrics negative OB ROS                             Anesthesia Physical Anesthesia Plan  ASA: 3  Anesthesia Plan: General ETT   Post-op Pain Management: Regional block*   Induction: Intravenous  PONV Risk Score and Plan: Ondansetron, Dexamethasone, Midazolam and Treatment may vary due to age or medical condition  Airway Management Planned: Oral ETT  Additional Equipment:   Intra-op Plan:   Post-operative Plan: Extubation in OR  Informed Consent: I have reviewed the patients History and Physical, chart, labs and discussed the procedure including the risks, benefits and alternatives for the proposed anesthesia with the patient or authorized representative who has indicated his/her understanding and acceptance.     Dental Advisory Given  Plan Discussed with: Anesthesiologist, CRNA and Surgeon  Anesthesia Plan Comments: (Patient declines spinal due to history of back pain  Patient consented for risks of anesthesia including but not limited to:  - adverse reactions to medications - damage to eyes, teeth, lips or other oral mucosa - nerve damage due to positioning  - sore throat or hoarseness - Damage to heart, brain, nerves, lungs, other parts of body or loss of life  Patient voiced understanding.)       Anesthesia Quick Evaluation

## 2021-12-28 NOTE — Anesthesia Postprocedure Evaluation (Signed)
Anesthesia Post Note  Patient: Adam Petty.  Procedure(s) Performed: TOTAL KNEE ARTHROPLASTY (Right: Knee)  Patient location during evaluation: PACU Anesthesia Type: Regional Level of consciousness: awake and alert Pain management: pain level controlled Vital Signs Assessment: post-procedure vital signs reviewed and stable Respiratory status: spontaneous breathing, nonlabored ventilation, respiratory function stable and patient connected to nasal cannula oxygen Cardiovascular status: blood pressure returned to baseline and stable Postop Assessment: no apparent nausea or vomiting Anesthetic complications: no   No notable events documented.   Last Vitals:  Vitals:   12/28/21 1145 12/28/21 1200  BP: 118/70 123/72  Pulse: 70   Resp: 11   Temp:  (!) 36.3 C  SpO2: 94%     Last Pain:  Vitals:   12/28/21 1200  TempSrc:   PainSc: 4                  Cleda Mccreedy Georgine Wiltse

## 2021-12-28 NOTE — Op Note (Signed)
DATE OF SURGERY:  12/28/2021 TIME: 10:04 AM  PATIENT NAME:  Adam Petty.   AGE: 61 y.o.    PRE-OPERATIVE DIAGNOSIS:  M17.11 Unilateral primary osteoarthritis, right knee  POST-OPERATIVE DIAGNOSIS:  Same  PROCEDURE:  Procedure(s): TOTAL KNEE ARTHROPLASTY, RIGHT  SURGEON:  Lyndle Herrlich, MD   ASSISTANT:  Altamese Cabal,  PA-C  OPERATIVE IMPLANTS: Katr[employer]hew, Cruciate Retaining Oxinium Femoral component size 5 narrow, Fixed Bearing Tray size 5, Patella polyethylene 3-peg oval button size 35 mm, with a 9 mm DISH insert.   PREOPERATIVE INDICATIONS:  Adam Emel. is an 61 y.o. male who has a diagnosis of M17.11 Unilateral primary osteoarthritis, right knee and elected for a total knee arthroplasty after failing nonoperative treatment, including activity modification, pain medication, physical therapy and injections who has significant impairment of their activities of daily living.  Radiographs have demonstrated tricompartmental osteoarthritis joint space narrowing, osteophytes, subchondral sclerosis and cyst formation.  The risks, benefits, and alternatives were discussed at length including but not limited to the risks of infection, bleeding, nerve or blood vessel injury, knee stiffness, fracture, dislocation, loosening or failure of the hardware and the need for further surgery. Medical risks include but not limited to DVT and pulmonary embolism, myocardial infarction, stroke, pneumonia, respiratory failure and death. I discussed these risks with the patient in my office prior to the date of surgery. They understood these risks and were willing to proceed.  OPERATIVE FINDINGS AND UNIQUE ASPECTS OF THE CASE:  All three compartments with advanced and severe degenerative changes, large osteophytes and an abundance of synovial fluid. Significant deformity was also noted. A decision was made to proceed with total knee arthroplasty.   OPERATIVE DESCRIPTION:  The patient  was brought to the operative room and placed in a supine position after undergoing placement of a general anesthetic. IV antibiotics were given. Patient received tranexamic acid. The lower extremity was prepped and draped in the usual sterile fashion.  A time out was performed to verify the patient's name, date of birth, medical record number, correct site of surgery and correct procedure to be performed. The timeout was also used to confirm the patient received antibiotics and that appropriate instruments, implants and radiographs studies were available in the room.  The leg was elevated and exsanguinated with an Esmarch and the tourniquet was inflated to 250 mmHg.  A midline incision was made over the left knee.. A medial parapatellar arthrotomy was then made and the patella subluxed laterally and the knee was brought into 90 of flexion. Hoffa's fat pad along with the anterior cruciate ligament was resected and the medial joint line was exposed.  Attention was then turned to preparation of the patella. The thickness of the patella was measured with a caliper, the diameter measured with the patella templates.  The patella resection was then made with an oscillating saw using the patella cutting guide.  The 35 mm button fit appropriately.  3 peg holes for the patella component were then drilled.  The extramedullary tibial cutting guide was then placed using the anterior tibial crest and second ray of the foot as a reference.  The tibial cutting guide was adjusted to allow for appropriate posterior slope.  The tibial cutting block was pinned into position. The slotted stylus was used to measure the proximal tibial resection of 9 mm off the high lateral side. Care was taken during the tibial resection to protect the medial and collateral ligaments.  The resected tibial bone  was removed.  The distal femur was resected using the intramedullary cutting guide.  Care was taken to protect the collateral ligaments  during distal femoral resection.  The distal femoral resection was performed with an oscillating saw. The femoral cutting guide was then removed. Extension gap was measured with a 9 mm spacer block and alignment and extension was confirmed using a long alignment rod. The femur was sized to be a 5 narrow. Rotation of the referencing guide was checked with the epicondylar axis and Whitesides line. Then the 4-in-1 cutting jig was then applied to the distal femur. A stylus was used to confirm that the anterior femur would not be notched.   Then the anterior, posterior and chamfer femoral cuts were then made with an oscillating saw.  The knee was distracted and all posterior osteophytes were removed.  The flexion gap was then measured with a flexion spacer block and long alignment rod and was found to be symmetric with the extension gap and perpendicular to mechanical axis of the tibia.  The proximal tibia plateau was then sized with trial trays. The best coverage was achieved with a size 5. This tibial tray was then pinned into position. The proximal tibia was then prepared with the keel punch.  After tibial preparation was completed, all trial components were inserted with polyethylene trials. The knee achieved full extension and flexed to 120 degrees. Ligament were stable to varus and valgus at full extension as well as 30, 60 and 90 degrees of flexion.   The trials were then placed. Knee was taken through a full range of motion and deemed to be stable with the trial components. All trial components were then removed.  The joint was copiously irrigated with pulse lavage.  The final total knee arthroplasty components were then cemented into place. The knee was held in extension while cement was allowed to cure.The knee was taken through a range of motion and the patella tracked well and the knee was again irrigated copiously.  The knee capsule was then injected with Exparel.  The medial arthrotomy was closed  with #1 Vicryl and #2 Quill. The subcutaneous tissue closed with  2-0 vicryl, and skin approximated with staples.  A dry sterile and compressive dressing was applied.  A Polar Care was applied to the operative knee.  The patient was awakened and brought to the PACU in stable and satisfactory condition.  All sharp, lap and instrument counts were correct at the conclusion the case. I spoke with the patient's family in the postop consultation room to let them know the case had been performed without complication and the patient was stable in recovery room.   Total tourniquet time was 58 minutes.

## 2021-12-28 NOTE — H&P (Signed)
The patient has been re-examined, and the chart reviewed, and there have been no interval changes to the documented history and physical.  Plan a right total knee today.  Anesthesia is consulted regarding a peripheral nerve block for post-operative pain.  The risks, benefits, and alternatives have been discussed at length, and the patient is willing to proceed.     

## 2021-12-28 NOTE — Anesthesia Procedure Notes (Signed)
Anesthesia Regional Block: Adductor canal block   Pre-Anesthetic Checklist: , timeout performed,  Correct Patient, Correct Site, Correct Laterality,  Correct Procedure, Correct Position, site marked,  Risks and benefits discussed,  Surgical consent,  Pre-op evaluation,  At surgeon's request and post-op pain management  Laterality: Lower and Right  Prep: chloraprep       Needles:  Injection technique: Single-shot  Needle Type: Echogenic Needle     Needle Length: 9cm  Needle Gauge: 21     Additional Needles:   Procedures:,,,, ultrasound used (permanent image in chart),,    Narrative:  Start time: 12/28/2021 7:22 AM End time: 12/28/2021 7:23 AM Injection made incrementally with aspirations every 5 mL.  Performed by: Personally  Anesthesiologist: Caytlyn Evers, Cleda Mccreedy, MD  Additional Notes: Patient consented for risk and benefits of nerve block including but not limited to nerve damage, failed block, bleeding and infection.  Patient voiced understanding.  Functioning IV was confirmed and monitors were applied.  Timeout done prior to procedure and prior to any sedation being given to the patient.  Patient confirmed procedure site prior to any sedation given to the patient.  A 16mm 22ga Stimuplex needle was used. Sterile prep,hand hygiene and sterile gloves were used.  Minimal sedation used for procedure.  No paresthesia endorsed by patient during the procedure.  Negative aspiration and negative test dose prior to incremental administration of local anesthetic. The patient tolerated the procedure well with no immediate complications.

## 2021-12-28 NOTE — Anesthesia Procedure Notes (Signed)
Procedure Name: Intubation Date/Time: 12/28/2021 7:43 AM  Performed by: Katherine Basset, CRNAPre-anesthesia Checklist: Patient identified, Emergency Drugs available, Suction available and Patient being monitored Patient Re-evaluated:Patient Re-evaluated prior to induction Oxygen Delivery Method: Circle system utilized Preoxygenation: Pre-oxygenation with 100% oxygen Induction Type: IV induction and Rapid sequence Laryngoscope Size: Miller and 2 Grade View: Grade I Tube type: Oral Tube size: 7.5 mm Number of attempts: 1 Airway Equipment and Method: Stylet, Oral airway and Bite block Placement Confirmation: ETT inserted through vocal cords under direct vision, positive ETCO2 and breath sounds checked- equal and bilateral Secured at: 21 cm Tube secured with: Tape Dental Injury: Teeth and Oropharynx as per pre-operative assessment

## 2021-12-28 NOTE — Evaluation (Signed)
Physical Therapy Evaluation Patient Details Name: Adam Petty. MRN: [id number] DOB: 06-Feb-1960 Today's Date: 12/28/2021  History of Present Illness  61 y/o male s/p R TKA on 12/28/21. PMH: T2DM, HTN, chronic opioid use, sleep apnea, IBS  Clinical Impression  Patient admitted with the above. PTA, patient lives with wife and was working full time as Production designer, theatre/television/film for rental homes. Patient presents with weakness, impaired balance, impaired R knee ROM, and decreased activity tolerance. Able to complete bed mobility and sit to stand transfer with supervision. Ambulated 250' with min guard-supervision and RW with cues for heel strike to promote R knee extension. Able to ascend 4 steps x 2 with 2 different techniques (see below) to safely access home environment. Instructed patient on HEP handout and encouraged to perform 10 reps x 1 this date then to progress to 2-3x/day for 15-20 reps. Patient will benefit from skilled PT services during acute stay to address listed deficits.        Recommendations for follow up therapy are one component of a multi-disciplinary discharge planning process, led by the attending physician.  Recommendations may be updated based on patient status, additional functional criteria and insurance authorization.  Follow Up Recommendations Follow physician's recommendations for discharge plan and follow up therapies      Assistance Recommended at Discharge PRN  Patient can return home with the following  Help with stairs or ramp for entrance;Assistance with cooking/housework;Assist for transportation    Equipment Recommendations Rolling Salene Mohamud (2 wheels);BSC/3in1  Recommendations for Other Services       Functional Status Assessment Patient has had a recent decline in their functional status and demonstrates the ability to make significant improvements in function in a reasonable and predictable amount of time.     Precautions / Restrictions  Precautions Precautions: Fall;Knee Precaution Booklet Issued: Yes (comment) Restrictions Weight Bearing Restrictions: Yes RLE Weight Bearing: Weight bearing as tolerated      Mobility  Bed Mobility Overal bed mobility: Needs Assistance Bed Mobility: Supine to Sit, Sit to Supine     Supine to sit: Supervision Sit to supine: Supervision        Transfers Overall transfer level: Needs assistance Equipment used: Rolling Armetta Henri (2 wheels) Transfers: Sit to/from Stand Sit to Stand: Min guard           General transfer comment: cues for hand placement    Ambulation/Gait Ambulation/Gait assistance: Min guard, Supervision Gait Distance (Feet): 250 Feet Assistive device: Rolling Jaquila Santelli (2 wheels) Gait Pattern/deviations: Step-through pattern, Decreased stride length, Decreased weight shift to right Gait velocity: decreased     General Gait Details: cues for  heel strike and step through gait pattern. Minimal use of RW this date.  Stairs Stairs: Yes Stairs assistance: Min guard Stair Management: Step to pattern, Forwards, One rail Right, Sideways Number of Stairs: 4 (+4) General stair comments: initial ascent, patient utilizing HHA on L and forwards. On second attempt, going up sideways with use of R handrail (mimicing door frame). Only has 2 stairs to enter home. Min guard for safety. Cues for sequencing  Wheelchair Mobility    Modified Rankin (Stroke Patients Only)       Balance Overall balance assessment: Mild deficits observed, not formally tested                                           Pertinent Vitals/Pain Pain Assessment Pain  Assessment: Faces Faces Pain Scale: Hurts a little bit Pain Location: R knee Pain Descriptors / Indicators: Grimacing, Discomfort, Guarding Pain Intervention(s): Monitored during session, Repositioned, Limited activity within patient's tolerance    Home Living Family/patient expects to be discharged to::  Private residence Living Arrangements: Spouse/significant other Available Help at Discharge: Family Type of Home: House Home Access: Stairs to enter Entrance Stairs-Rails: None Entrance Stairs-Number of Steps: 2   Home Layout: One level Home Equipment: Rollator (4 wheels)      Prior Function Prior Level of Function : Independent/Modified Independent;Working/employed;Driving;History of Falls (last six months)             Mobility Comments: reports fall x 3 days prior to surgery       Hand Dominance        Extremity/Trunk Assessment   Upper Extremity Assessment Upper Extremity Assessment: Overall WFL for tasks assessed    Lower Extremity Assessment Lower Extremity Assessment: RLE deficits/detail RLE Deficits / Details: deficits consistent with post op pain and weakness. Able to perform SLR with ~10 degrees of extension lag    Cervical / Trunk Assessment Cervical / Trunk Assessment: Normal  Communication   Communication: No difficulties  Cognition Arousal/Alertness: Awake/alert Behavior During Therapy: WFL for tasks assessed/performed Overall Cognitive Status: Within Functional Limits for tasks assessed                                          General Comments      Exercises Other Exercises Other Exercises: instructed patient on HEP handout   Assessment/Plan    PT Assessment Patient needs continued PT services  PT Problem List Decreased strength;Decreased range of motion;Decreased activity tolerance;Decreased balance;Decreased mobility;Decreased knowledge of use of DME;Decreased knowledge of precautions       PT Treatment Interventions DME instruction;Stair training;Gait training;Functional mobility training;Therapeutic activities;Therapeutic exercise;Balance training;Patient/family education    PT Goals (Current goals can be found in the Care Plan section)  Acute Rehab PT Goals Patient Stated Goal: to go home PT Goal Formulation: With  patient Time For Goal Achievement: 01/11/22 Potential to Achieve Goals: Good    Frequency BID     Co-evaluation               AM-PAC PT "6 Clicks" Mobility  Outcome Measure Help needed turning from your back to your side while in a flat bed without using bedrails?: A Little Help needed moving from lying on your back to sitting on the side of a flat bed without using bedrails?: A Little Help needed moving to and from a bed to a chair (including a wheelchair)?: A Little Help needed standing up from a chair using your arms (e.g., wheelchair or bedside chair)?: A Little Help needed to walk in hospital room?: A Little Help needed climbing 3-5 steps with a railing? : A Little 6 Click Score: 18    End of Session Equipment Utilized During Treatment: Gait belt Activity Tolerance: Patient tolerated treatment well Patient left: in bed;with call bell/phone within reach Nurse Communication: Mobility status PT Visit Diagnosis: Unsteadiness on feet (R26.81);Muscle weakness (generalized) (M62.81);Difficulty in walking, not elsewhere classified (R26.2)    Time: 1359-1430 PT Time Calculation (min) (ACUTE ONLY): 31 min   Charges:   PT Evaluation $PT Eval Low Complexity: 1 Low PT Treatments $Therapeutic Activity: 8-22 mins        Adam Petty A. Dan Humphreys, PT, DPT [facility] -  Acute Rehabilitation Services   Viviann Spare 12/28/2021, 2:44 PM

## 2021-12-28 NOTE — Transfer of Care (Signed)
Immediate Anesthesia Transfer of Care Note  Patient: Adam Petty.  Procedure(s) Performed: TOTAL KNEE ARTHROPLASTY (Right: Knee)  Patient Location: PACU  Anesthesia Type:General and Regional  Level of Consciousness: drowsy  Airway & Oxygen Therapy: Patient Spontanous Breathing and Patient connected to face mask oxygen  Post-op Assessment: Report given to RN, Post -op Vital signs reviewed and stable, and Patient moving all extremities  Post vital signs: Reviewed and stable  Last Vitals:  Vitals Value Taken Time  BP 125/81 12/28/21 0940  Temp    Pulse 71 12/28/21 0942  Resp 18 12/28/21 0942  SpO2 100 % 12/28/21 0942  Vitals shown include unvalidated device data.  Last Pain:  Vitals:   12/28/21 0627  TempSrc: Temporal  PainSc: 2          Complications: No notable events documented.

## 2021-12-29 ENCOUNTER — Emergency Department
Admission: EM | Admit: 2021-12-29 | Discharge: 2021-12-30 | Disposition: A | Discharge status: Home / Self Care | Payer: BC Managed Care – PPO | Source: Home / Self Care | Attending: Emergency Medicine | Admitting: Emergency Medicine

## 2021-12-29 ENCOUNTER — Telehealth: Payer: Self-pay | Admitting: Pain Medicine

## 2021-12-29 ENCOUNTER — Encounter: Payer: Self-pay | Admitting: Orthopedic Surgery

## 2021-12-29 DIAGNOSIS — Z96651 Presence of right artificial knee joint: Secondary | ICD-10-CM | POA: Insufficient documentation

## 2021-12-29 DIAGNOSIS — G8918 Other acute postprocedural pain: Secondary | ICD-10-CM | POA: Insufficient documentation

## 2021-12-29 DIAGNOSIS — E119 Type 2 diabetes mellitus without complications: Secondary | ICD-10-CM | POA: Insufficient documentation

## 2021-12-29 DIAGNOSIS — M25561 Pain in right knee: Secondary | ICD-10-CM | POA: Insufficient documentation

## 2021-12-29 LAB — GLUCOSE, CAPILLARY: Glucose-Capillary: 180 mg/dL — ABNORMAL HIGH (ref 70–99)

## 2021-12-29 LAB — HEMOGLOBIN A1C
Hgb A1c MFr Bld: 6.6 % — ABNORMAL HIGH (ref 4.8–5.6)
Mean Plasma Glucose: 143 mg/dL

## 2021-12-29 LAB — POCT I-STAT, CHEM 8
BUN: 15 mg/dL (ref 6–20)
Calcium, Ion: 1.23 mmol/L (ref 1.15–1.40)
Chloride: 105 mmol/L (ref 98–111)
Creatinine, Ser: 0.9 mg/dL (ref 0.61–1.24)
Glucose, Bld: 133 mg/dL — ABNORMAL HIGH (ref 70–99)
HCT: 39 % (ref 39.0–52.0)
Hemoglobin: 13.3 g/dL (ref 13.0–17.0)
Potassium: 4.2 mmol/L (ref 3.5–5.1)
Sodium: 139 mmol/L (ref 135–145)
TCO2: 23 mmol/L (ref 22–32)

## 2021-12-29 MED ORDER — KETOROLAC TROMETHAMINE 15 MG/ML IJ SOLN
INTRAMUSCULAR | Status: AC
  Filled 2021-12-29: qty 1

## 2021-12-29 MED ORDER — HYDROMORPHONE HCL 1 MG/ML IJ SOLN
INTRAMUSCULAR | Status: AC
  Filled 2021-12-29: qty 1

## 2021-12-29 MED ORDER — ASPIRIN 81 MG PO CHEW
CHEWABLE_TABLET | ORAL | Status: AC
  Filled 2021-12-29: qty 1

## 2021-12-29 MED ORDER — OXYCODONE HCL 5 MG PO TABS
ORAL_TABLET | ORAL | Status: AC
  Filled 2021-12-29: qty 2

## 2021-12-29 MED ORDER — ONDANSETRON HCL 4 MG/2ML IJ SOLN: 4.0000 mg | Freq: Once | INTRAMUSCULAR | Status: AC

## 2021-12-29 MED ORDER — HYDROMORPHONE HCL 1 MG/ML IJ SOLN
0.5000 mg | INTRAMUSCULAR | Status: AC
  Administered 2021-12-29: 0.5 mg via INTRAVENOUS
  Filled 2021-12-29: qty 0.5

## 2021-12-29 MED ORDER — ONDANSETRON HCL 4 MG/2ML IJ SOLN
INTRAMUSCULAR | Status: AC
  Administered 2021-12-29: 4 mg via INTRAVENOUS
  Filled 2021-12-29: qty 2

## 2021-12-29 MED ORDER — ASPIRIN 81 MG PO CHEW: 81.0000 mg | CHEWABLE_TABLET | Freq: Two times a day (BID) | ORAL | 0 refills | Status: DC

## 2021-12-29 MED ORDER — DOCUSATE SODIUM 100 MG PO CAPS
ORAL_CAPSULE | ORAL | Status: AC
  Filled 2021-12-29: qty 1

## 2021-12-29 MED ORDER — INSULIN ASPART 100 UNIT/ML IJ SOLN
INTRAMUSCULAR | Status: AC
  Filled 2021-12-29: qty 1

## 2021-12-29 MED ORDER — KETOROLAC TROMETHAMINE 30 MG/ML IJ SOLN
30.0000 mg | Freq: Once | INTRAMUSCULAR | Status: AC
  Administered 2021-12-29: 30 mg via INTRAVENOUS
  Filled 2021-12-29: qty 1

## 2021-12-29 MED ORDER — DOCUSATE SODIUM 100 MG PO CAPS: 100.0000 mg | ORAL_CAPSULE | Freq: Two times a day (BID) | ORAL | 0 refills | Status: DC

## 2021-12-29 MED ORDER — METHOCARBAMOL 500 MG PO TABS: 500.0000 mg | ORAL_TABLET | Freq: Four times a day (QID) | ORAL | 1 refills | Status: DC | PRN

## 2021-12-29 MED ORDER — OXYCODONE-ACETAMINOPHEN 10-325 MG PO TABS: 1.0000 | ORAL_TABLET | ORAL | 0 refills | Status: DC | PRN

## 2021-12-29 NOTE — Telephone Encounter (Signed)
Spoke with patient and he has surgery  yesterday and is currently in t he hospital.  He will be discharged with a prescription for Percocet 10/325 mg.  Pharmacy notified that it is ok for them to fill the script due to having surgery.

## 2021-12-29 NOTE — Progress Notes (Signed)
Physical Therapy Treatment Patient Details Name: Adam Petty. MRN: [id number] DOB: [birth year] Today's Date: 12/29/2021   History of Present Illness 61 y/o male s/p R TKA on 12/28/21. PMH: T2DM, HTN, chronic opioid use, sleep apnea, IBS    PT Comments    Patient continues to progress towards physical therapy goals. Supine in bed on arrival and able to complete bed mobility and sit to stand modI with RW. Ambulated 300' with RW and supervision with good recall of heel strike to promote R knee extension. Negotiated 4 stairs x 2 sideways with supervision and no handrail. Patient reports performing supine exercises prior to PT arrival but did perform seated exercises at end of session. D/c plan remains appropriate. Encouraged daily ambulation at least 3-4x/day and exercises 2-3x/day.     Recommendations for follow up therapy are one component of a multi-disciplinary discharge planning process, led by the attending physician.  Recommendations may be updated based on patient status, additional functional criteria and insurance authorization.  Follow Up Recommendations  Follow physician's recommendations for discharge plan and follow up therapies     Assistance Recommended at Discharge PRN  Patient can return home with the following Help with stairs or ramp for entrance;Assistance with cooking/housework;Assist for transportation   Equipment Recommendations  Rolling Joffre Lucks (2 wheels)    Recommendations for Other Services       Precautions / Restrictions Precautions Precautions: Fall;Knee Precaution Booklet Issued: Yes (comment) Restrictions Weight Bearing Restrictions: Yes RLE Weight Bearing: Weight bearing as tolerated     Mobility  Bed Mobility Overal bed mobility: Modified Independent                  Transfers Overall transfer level: Modified independent Equipment used: Rolling Adamarie Izzo (2 wheels)                    Ambulation/Gait Ambulation/Gait  assistance: Supervision Gait Distance (Feet): 300 Feet Assistive device: Rolling Murielle Stang (2 wheels) Gait Pattern/deviations: Step-through pattern, Decreased stride length, Decreased weight shift to right Gait velocity: decreased     General Gait Details: good recall of heel strike during mobility to encourage R knee extension   Stairs Stairs: Yes Stairs assistance: Supervision Stair Management: Step to pattern, Sideways, No rails Number of Stairs: 4 (+4) General stair comments: cues for sequencing. Able to perform with no handrail   Wheelchair Mobility    Modified Rankin (Stroke Patients Only)       Balance Overall balance assessment: Mild deficits observed, not formally tested                                          Cognition Arousal/Alertness: Awake/alert Behavior During Therapy: WFL for tasks assessed/performed Overall Cognitive Status: Within Functional Limits for tasks assessed                                          Exercises Total Joint Exercises Long Arc Quad: AROM, Right, 10 reps, Seated Knee Flexion: AROM, Right, 10 reps, Seated    General Comments        Pertinent Vitals/Pain Pain Assessment Pain Assessment: 0-10 Pain Score: 7  Pain Location: R knee Pain Descriptors / Indicators: Grimacing, Discomfort, Guarding Pain Intervention(s): Monitored during session    Home Living  Prior Function            PT Goals (current goals can now be found in the care plan section) Acute Rehab PT Goals Patient Stated Goal: to go home PT Goal Formulation: With patient Time For Goal Achievement: 01/11/22 Potential to Achieve Goals: Good Progress towards PT goals: Progressing toward goals    Frequency    BID      PT Plan Current plan remains appropriate    Co-evaluation              AM-PAC PT "6 Clicks" Mobility   Outcome Measure  Help needed turning from your back to  your side while in a flat bed without using bedrails?: A Little Help needed moving from lying on your back to sitting on the side of a flat bed without using bedrails?: A Little Help needed moving to and from a bed to a chair (including a wheelchair)?: A Little Help needed standing up from a chair using your arms (e.g., wheelchair or bedside chair)?: A Little Help needed to walk in hospital room?: A Little Help needed climbing 3-5 steps with a railing? : A Little 6 Click Score: 18    End of Session   Activity Tolerance: Patient tolerated treatment well Patient left: in bed;with call bell/phone within reach;with family/visitor present Nurse Communication: Mobility status PT Visit Diagnosis: Unsteadiness on feet (R26.81);Muscle weakness (generalized) (M62.81);Difficulty in walking, not elsewhere classified (R26.2)     Time: 6812-7517 PT Time Calculation (min) (ACUTE ONLY): 25 min  Charges:  $Therapeutic Exercise: 8-22 mins $Therapeutic Activity: 8-22 mins                     Bliss Behnke A. Dan Humphreys PT, DPT [facility] - Acute Rehabilitation Services    Shelle Galdamez A Omnia Dollinger 12/29/2021, 9:18 AM

## 2021-12-29 NOTE — Telephone Encounter (Signed)
Pharmacy stated an prescription was send in for percocet by Dr. Yetta Barre. Pharmacy wanted to make sure that it's okay to fill prescription. Please give pharmacy a call. Thanks

## 2021-12-29 NOTE — ED Triage Notes (Signed)
Pt presents via POV with complaints of a post off problem. He had a total R knee replacement yesterday and was released this AM and has been having uncontrolled pain since that time. Pt states that he has no drainage or bleeding. He has used his prescribed pain medication, elevation and an ice pack without improvement. Denies fevers, CP or SOB.

## 2021-12-29 NOTE — Progress Notes (Signed)
  Subjective:  Patient reports pain as moderate.    Objective:   VITALS:   Vitals:   12/28/21 2030 12/29/21 0015 12/29/21 0523 12/29/21 0715  BP: 138/82 (!) 140/80 (!) 142/82 126/68  Pulse: 98 88 86 78  Resp: 18 18 16 16   Temp: 98.8 F (37.1 C) 98.9 F (37.2 C) 98.8 F (37.1 C) 98 F (36.7 C)  TempSrc: Oral Oral Oral Oral  SpO2:  98% 98% 99%  Weight:      Height:        PHYSICAL EXAM:  Neurologically intact ABD soft Neurovascular intact Sensation intact distally Intact pulses distally Dorsiflexion/Plantar flexion intact Incision: dressing C/D/I No cellulitis present Compartment soft  LABS  Results for orders placed or performed during the hospital encounter of 12/28/21 (from the past 24 hour(s))  Glucose, capillary     Status: Abnormal   Collection Time: 12/28/21  9:49 AM  Result Value Ref Range   Glucose-Capillary 170 (H) 70 - 99 mg/dL  Hemoglobin 14/12/23     Status: Abnormal   Collection Time: 12/28/21 12:51 PM  Result Value Ref Range   Hgb A1c MFr Bld 6.6 (H) 4.8 - 5.6 %   Mean Plasma Glucose 143 mg/dL  Glucose, capillary     Status: Abnormal   Collection Time: 12/28/21  6:09 PM  Result Value Ref Range   Glucose-Capillary 279 (H) 70 - 99 mg/dL  Glucose, capillary     Status: Abnormal   Collection Time: 12/28/21  7:00 PM  Result Value Ref Range   Glucose-Capillary 301 (H) 70 - 99 mg/dL  Glucose, capillary     Status: Abnormal   Collection Time: 12/28/21 10:02 PM  Result Value Ref Range   Glucose-Capillary 236 (H) 70 - 99 mg/dL  Glucose, capillary     Status: Abnormal   Collection Time: 12/29/21  7:53 AM  Result Value Ref Range   Glucose-Capillary 180 (H) 70 - 99 mg/dL    12/31/21 OR NERVE BLOCK-IMAGE ONLY [facility])  Result Date: 12/28/2021 There is no interpretation for this exam.  This order is for images obtained during a surgical procedure.  Please See "Surgeries" Tab for more information regarding the procedure.    Assessment/Plan: 1 Day Post-Op    Principal Problem:   S/P TKR (total knee replacement) using cement, right   Advance diet Up with therapy Discharge home today   14/12/2021 , PA-C 12/29/2021, 8:08 AM

## 2021-12-29 NOTE — ED Provider Notes (Signed)
[facility] Provider Note    Event Date/Time   First MD Initiated Contact with Patient 12/29/21 2237     (approximate)   History   Post-op Problem   HPI  Adam Petty. is a 61 y.o. male with history of type 2 diabetes degenerative disc disease and as listed in the EMR presents to the emergency department for treatment and evaluation of uncontrollable pain after right knee replacement performed yesterday.  He is released this afternoon and had been doing fairly well.  He started having pain after he got home.  No relief with Percocet 10.     Physical Exam   Triage Vital Signs: ED Triage Vitals  Enc Vitals Group     BP 12/29/21 2129 (!) 145/83     Pulse Rate 12/29/21 2129 (!) 102     Resp 12/29/21 2129 18     Temp 12/29/21 2129 98.2 F (36.8 C)     Temp Source 12/29/21 2129 Oral     SpO2 12/29/21 2129 98 %     Weight 12/29/21 2131 236 lb 15.9 oz (107.5 kg)     Height 12/29/21 2131 5\' 6"  (1.676 m)     Head Circumference --      Peak Flow --      Pain Score 12/29/21 2132 10     Pain Loc --      Pain Edu? --      Excl. in [place]? --     Most recent vital signs: Vitals:   12/29/21 2129 12/29/21 2306  BP: (!) 145/83 (!) 143/88  Pulse: (!) 102 (!) 115  Resp: 18 20  Temp: 98.2 F (36.8 C) 98.6 F (37 C)  SpO2: 98% 94%     General: Awake, no distress.  CV:  Good peripheral perfusion.  Resp:  Normal effort.  Abd:  No distention.  Other:  DP pulse of the right foot 2+. Extremity is warm to touch.   ED Results / Procedures / Treatments   Labs (all labs ordered are listed, but only abnormal results are displayed) Labs Reviewed - No data to display   EKG  Not indicated.   RADIOLOGY  Not indicated.   PROCEDURES:  Critical Care performed: No  Procedures   MEDICATIONS ORDERED IN ED: Medications  HYDROmorphone (DILAUDID) injection 0.5 mg (0.5 mg Intravenous Given 12/29/21 2300)  ondansetron (ZOFRAN) injection 4 mg (4  mg Intravenous Given 12/29/21 2300)  HYDROmorphone (DILAUDID) injection 0.5 mg (0.5 mg Intravenous Given 12/29/21 2351)  ketorolac (TORADOL) 30 MG/ML injection 30 mg (30 mg Intravenous Given 12/29/21 2351)     IMPRESSION / MDM / ASSESSMENT AND PLAN / ED COURSE  I reviewed the triage vital signs and the nursing notes.                              Differential diagnosis includes, but is not limited to, uncontrolled pain, septic joint  Patient's presentation is most consistent with acute, uncomplicated illness.  61 year old male presenting to the emergency department for treatment of uncontrolled pain 2 days after total knee replacement.  See HPI.  ----------------------------------------- 11:03 PM on 12/29/2021 ----------------------------------------- Discussed with Dr. Sharlet Salina with [facility].  Recommends attempting pain control in the emergency department and having him speak with Dr. Harlow Mares tomorrow. He recommends trying a dose of toradol.  Zofran and Dilaudid ordered.  Clinical Course as of 12/30/21 0013  Wed Dec 29, 2021  2337 No improvement of pain. Toradol and second dose of dilaudid ordered.  [CT]  Thu Dec 30, 2021  0012 Minimal improvement. Moving patient to 18H and will hand off to MD. [CT]    Clinical Course User Index [CT] Adam Aaronson B, FNP     FINAL CLINICAL IMPRESSION(S) / ED DIAGNOSES   Final diagnoses:  Acute post-operative pain     Rx / DC Orders   ED Discharge Orders     None        Note:  This document was prepared using Dragon voice recognition software and may include unintentional dictation errors.   Chinita Pester, FNP 12/30/21 7014    Minna Antis, MD 12/30/21 1949

## 2021-12-29 NOTE — ED Provider Notes (Incomplete)
[facility] Provider Note    Event Date/Time   First MD Initiated Contact with Patient 12/29/21 2237     (approximate)   History   Post-op Problem   HPI  Adam Glaspy. is a 61 y.o. male with history of type 2 diabetes degenerative disc disease and as listed in the EMR presents to the emergency department for treatment and evaluation of uncontrollable pain after right knee replacement performed yesterday.  He is released this afternoon and had been doing fairly well.  He started having pain after he got home.  No relief with Percocet 10.     Physical Exam   Triage Vital Signs: ED Triage Vitals  Enc Vitals Group     BP 12/29/21 2129 (!) 145/83     Pulse Rate 12/29/21 2129 (!) 102     Resp 12/29/21 2129 18     Temp 12/29/21 2129 98.2 F (36.8 C)     Temp Source 12/29/21 2129 Oral     SpO2 12/29/21 2129 98 %     Weight 12/29/21 2131 236 lb 15.9 oz (107.5 kg)     Height 12/29/21 2131 5\' 6"  (1.676 m)     Head Circumference --      Peak Flow --      Pain Score 12/29/21 2132 10     Pain Loc --      Pain Edu? --      Excl. in [place]? --     Most recent vital signs: Vitals:   12/29/21 2129 12/29/21 2306  BP: (!) 145/83 (!) 143/88  Pulse: (!) 102 (!) 115  Resp: 18 20  Temp: 98.2 F (36.8 C) 98.6 F (37 C)  SpO2: 98% 94%     General: Awake, no distress.  CV:  Good peripheral perfusion.  Resp:  Normal effort.  Abd:  No distention.  Other:  DP pulse of the right foot 2+. Extremity is warm to touch.  Postop dressing clean and dry   ED Results / Procedures / Treatments   Labs (all labs ordered are listed, but only abnormal results are displayed) Labs Reviewed - No data to display   EKG  Not indicated.   RADIOLOGY  Not indicated.   PROCEDURES:  Critical Care performed: No  Procedures   MEDICATIONS ORDERED IN ED: Medications  HYDROmorphone (DILAUDID) injection 0.5 mg (has no administration in time range)  ketorolac  (TORADOL) 30 MG/ML injection 30 mg (has no administration in time range)  HYDROmorphone (DILAUDID) injection 0.5 mg (0.5 mg Intravenous Given 12/29/21 2300)  ondansetron (ZOFRAN) injection 4 mg (4 mg Intravenous Given 12/29/21 2300)     IMPRESSION / MDM / ASSESSMENT AND PLAN / ED COURSE  I reviewed the triage vital signs and the nursing notes.                              Differential diagnosis includes, but is not limited to, uncontrolled pain, septic joint  Patient's presentation is most consistent with acute, uncomplicated illness.  61 year old male presenting to the emergency department for treatment of uncontrolled pain 2 days after total knee replacement.  See HPI.  ----------------------------------------- 11:03 PM on 12/29/2021 ----------------------------------------- Discussed with Dr. 12/31/2021 with [facility].  Recommends attempting pain control in the emergency department and having him speak with Dr. Okey Dupre tomorrow. He recommends trying a dose of toradol.  Zofran and Dilaudid ordered.  Clinical Course as of 12/29/21 2340  Wed Dec 29, 2021  2337 No improvement of pain. Toradol and second dose of dilaudid ordered.  [CT]    Clinical Course User Index [CT] Arra Connaughton B, FNP     FINAL CLINICAL IMPRESSION(S) / ED DIAGNOSES   Final diagnoses:  None     Rx / DC Orders   ED Discharge Orders     None        Note:  This document was prepared using Dragon voice recognition software and may include unintentional dictation errors.

## 2021-12-29 NOTE — Discharge Instructions (Signed)
Continue weight bear as tolerated on the right lower extremity.    Elevate the right lower extremity whenever possible and continue the polar care while elevating the extremity. Patient may shower. No bath or submerging the wound.    Take aspirin 81 mg twice daily for 30 days as directed for blood clot prevention.  Continue to work on knee range of motion exercises at home as instructed by physical therapy. Continue to use a walker for assistance with ambulation until cleared by physical therapy.  Call [phone] with any questions, such as fever > 101.5 degrees, drainage from the wound or shortness of breath. 
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
## 2021-12-30 MED ORDER — OXYCODONE HCL 5 MG PO TABS
20.0000 mg | ORAL_TABLET | Freq: Once | ORAL | Status: AC
  Administered 2021-12-30: 20 mg via ORAL
  Filled 2021-12-30: qty 4

## 2021-12-30 MED ORDER — KETAMINE HCL 50 MG/5ML IJ SOSY
20.0000 mg | PREFILLED_SYRINGE | Freq: Once | INTRAMUSCULAR | Status: AC
  Administered 2021-12-30: 20 mg via INTRAVENOUS
  Filled 2021-12-30: qty 5

## 2021-12-30 NOTE — Discharge Instructions (Addendum)
Use Tylenol for pain and fevers.  Up to 1000 mg per dose, up to 4 times per day.  Do not take more than 4000 mg of Tylenol/acetaminophen within 24 hours..  Use naproxen/Aleve for anti-inflammatory pain relief. Use up to 500mg  every 12 hours. Do not take more frequently than this. Do not use other NSAIDs (ibuprofen, Advil) while taking this medication. It is safe to take Tylenol with this.   Elevate and Ice

## 2021-12-30 NOTE — ED Notes (Signed)
Pt Dc to home. Dc instructions reviewed with all questions answered. Pt verbalizes understanding. IV removed, cath intact, pressure dressing applied. No bleeding noted at site Pt assisted out of dept via wheelchair with family member 

## 2022-01-06 ENCOUNTER — Telehealth: Payer: Self-pay | Admitting: Pain Medicine

## 2022-01-06 NOTE — Telephone Encounter (Signed)
Pharmacy stated that patient had an prescription send in for oxycodone 10-325 from Dr. Yetta Barre. Pharmacy wanted to see if it's okay to fill prescription for patient. Please give pharmacy a call. [facility] in [place]. Thanks

## 2022-01-06 NOTE — Telephone Encounter (Signed)
Spoke with patient and he reports that he had a knee replacement on his right knee and this is why he has had additional pain medications from Dr Yetta Barre.  I will call and give approval for post-procedural pain medication.   Held for pharmacist at [facility] for approx 20 minutes.  Called back and left a message that it is okay to fill Adam Petty post surgical pain medication.

## 2022-01-06 NOTE — Telephone Encounter (Signed)
Attempted to reach patient, voicemail left for him to please call re; message that was sent from pharmacy about oxycodone 10-325 mg sent in by a Dr Yetta Barre.  After I speak with Homero Fellers and find out what is going on I will call the pharmacy.

## 2022-01-12 ENCOUNTER — Ambulatory Visit
Admission: RE | Admit: 2022-01-12 | Discharge: 2022-01-12 | Disposition: A | Discharge status: Home / Self Care | Payer: BC Managed Care – PPO | Source: Ambulatory Visit | Attending: Pain Medicine | Admitting: Pain Medicine

## 2022-01-12 DIAGNOSIS — G8929 Other chronic pain: Secondary | ICD-10-CM | POA: Diagnosis present

## 2022-01-12 DIAGNOSIS — M25531 Pain in right wrist: Secondary | ICD-10-CM | POA: Insufficient documentation

## 2022-01-12 DIAGNOSIS — M7711 Lateral epicondylitis, right elbow: Secondary | ICD-10-CM | POA: Insufficient documentation

## 2022-01-12 DIAGNOSIS — M25521 Pain in right elbow: Secondary | ICD-10-CM

## 2022-01-23 NOTE — Progress Notes (Unsigned)
PROVIDER NOTE: Information contained herein reflects review and annotations entered in association with encounter. Interpretation of such information and data should be left to medically-trained personnel. Information provided to patient can be located elsewhere in the medical record under "Patient Instructions". Document created using STT-dictation technology, any transcriptional errors that may result from process are unintentional.    Patient: Adam Petty.  Service Category: E/M  Provider: Oswaldo Done, MD  DOB: August 15, 1960  DOS: 01/26/2022  Referring Provider: No ref. provider found  MRN: [id number]  Specialty: Interventional Pain Management  PCP: System, Provider Not In  Type: Established Patient  Setting: Ambulatory outpatient    Location: Office  Delivery: Face-to-face     HPI  [name], a 62 y.o. year old male, is here today because of his No primary diagnosis found.. Adam Petty primary complain today is No chief complaint on file. Last encounter: My last encounter with him was on 01/06/2022. 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); Discogenic syndrome, lumbar (WC injury); Osteoarthritis of spine with radiculopathy, lumbar region (WC injury); Lumbar facet syndrome (WC injury) (Bilateral) (R>L); Lumbosacral radiculopathy (WC injury); Lumbar canal stenosis; Neurogenic pain; Neuropathic pain; Musculoskeletal pain; Lumbar spondylosis; Chronic knee pain (2ry area of Pain) (WC injury) (Right); Osteoarthritis of knee (Right); Chronic shoulder pain (3ry area of Pain) (Right); Osteoarthritis of shoulder (Right); Chronic lower extremity pain (Right); Recurrent nephrolithiasis; Acute medial meniscal tear; Chronic pain syndrome (WC injury); Acute low back pain without sciatica; Osteoarthritis  of glenohumeral joint (Right); Chronic sacroiliac joint pain (Bilateral) (R>L); Spondylosis without myelopathy or radiculopathy, lumbosacral region; DDD (degenerative disc disease), lumbosacral; DDD (degenerative disc disease), thoracic; Lumbosacral radiculopathy (S1) (Right); Inflammatory spondylopathy of lumbosacral region [facility]); DDD (degenerative disc disease), cervical; Cervical facet syndrome; Cervicalgia; Cervicogenic headache; Elbow pain (Right); Greater trochanteric bursitis of right hip; Lateral epicondylitis of elbow (Right); Tennis elbow (Right); Chronic elbow pain (Right); Chronic forearm joint pain (Right); and Osteoarthritis of elbow (Right) on their pertinent problem list. Pain Assessment: Severity of   is reported as a  /10. Location:    / . Onset:  . Quality:  . Timing:  . Modifying factor(s):  Marland Kitchen Vitals:  vitals were not taken for this visit.  BMI: Estimated body mass index is 38.25 kg/m as calculated from the following:   Height as of 12/29/21: 5\' 6"  (1.676 m).   Weight as of 12/29/21: 236 lb 15.9 oz (107.5 kg).  Reason for encounter: medication management. ***    Pharmacotherapy Assessment  Analgesic: Oxycodone IR 5 mg, 1 tab PO q 6 hrs (20 mg/day)  MME/day: 30 mg/day.   Monitoring: [place] PMP: PDMP reviewed during this encounter.       Pharmacotherapy: No side-effects or adverse reactions reported. Compliance: No problems identified. Effectiveness: Clinically acceptable.  No notes on file  No results found for: "CBDTHCR" No results found for: "D8THCCBX" No results found for: "D9THCCBX"  UDS:  Summary  Date Value Ref Range Status  07/26/2021 Note  Final    Comment:    ==================================================================== [employer] [facility] (MW) ==================================================================== Test                             Result       Flag       Units  Drug Present and Declared for Prescription Verification   Oxycodone                       2655         EXPECTED   ng/mg creat   Oxymorphone                    1235         EXPECTED   ng/mg creat   Noroxycodone                   2413         EXPECTED   ng/mg creat   Noroxymorphone                 438          EXPECTED   ng/mg creat    Sources of oxycodone are scheduled prescription medications.    Oxymorphone, noroxycodone, and noroxymorphone are expected    metabolites of oxycodone. Oxymorphone is also available as a    scheduled prescription medication.  ==================================================================== Test                      Result    Flag   Units      Ref Range   Creatinine              280              mg/dL      >=27 ==================================================================== Declared Medications:  The flagging and interpretation on this report are based on the  following declared medications.  Unexpected results may arise from  inaccuracies in the declared medications.   **Note: The testing scope of this panel includes these medications:   Oxycodone (Percocet)   **Note: The testing scope of this panel does not include the  following reported medications:   Acetaminophen (Percocet)  Bisacodyl  Cholecalciferol  Docusate (Colace)  Gabapentin (Neurontin)  Meloxicam (Mobic)  Metformin (Glucophage)  Naproxen (Aleve)  Ramipril (Altace)  Rosuvastatin (Crestor)  Supplement (Fiber)  Vitamin B12 ==================================================================== For clinical consultation, please call [phone]. ====================================================================       ROS  Constitutional: Denies any fever or chills Gastrointestinal: No reported hemesis, hematochezia, vomiting, or acute GI distress Musculoskeletal: Denies any acute onset joint swelling, redness, loss of ROM, or weakness Neurological: No reported episodes of acute onset apraxia, aphasia, dysarthria, agnosia, amnesia, paralysis,  loss of coordination, or loss of consciousness  Medication Review  Cholecalciferol, Semaglutide(0.25 or 0.5MG /DOS), aspirin, celecoxib, cyanocobalamin, docusate sodium, gabapentin, metFORMIN, methocarbamol, naloxone, naproxen sodium, oxyCODONE-acetaminophen, ramipril, and rosuvastatin  History Review  Allergy: Adam Petty has No Known Allergies. Drug: Adam Petty  reports no history of drug use. Alcohol:  reports current alcohol use. Tobacco:  reports that he has never smoked. He has quit using smokeless tobacco.  His smokeless tobacco use included chew. Social: Adam Petty  reports that he has never smoked. He has quit using smokeless tobacco.  His smokeless tobacco use included chew. He reports current alcohol use. He reports that he does not use drugs. Medical:  has a past medical history of Arthritis, senescent (10/22/2014), Back pain, Chronic pain syndrome (10/22/2014), DJD (degenerative joint disease), Dyslipidemia, Essential hypertension, History of kidney stones, Hypercholesterolemia, IBS (irritable bowel syndrome), Lateral meniscus tear, Osteoarthritis, Preventative health care, Sleep apnea, Spinal stenosis, and Type 2 diabetes mellitus without complication (HCC). Surgical: Adam Petty  has a past surgical history that includes  Tonsillectomy; Shoulder surgery (Right); Palate surgery; Cervical fusion (1995); Foot surgery; Lumbar laminectomy/decompression microdiscectomy (N/A, 02/06/2013); Knee arthroscopy (Right, 10/02/2013); Knee arthroscopy with medial menisectomy (Right, 03/25/2015); Knee bursectomy (Right, 03/25/2015); Cystoscopy w/ retrogrades (Right, 11/19/2015); Cystoscopy w/ retrogrades (Right, 10/03/2017); Ureteroscopy (Right, 10/03/2017); CTR (Left, 02/27/2018); Colonoscopy (01/24/2003); Colonoscopy (01/16/2018); Rotator cuff repair (Right); Rotator cuff repair (Left); and Total knee arthroplasty (Right, 12/28/2021). Family: family history includes COPD in his mother; Cancer in his  mother; Diabetes in his father; Heart disease in his father and mother.  Laboratory Chemistry Profile   Renal Lab Results  Component Value Date   BUN 15 12/28/2021   CREATININE 0.90 12/28/2021   GFRAA >60 07/01/2019   GFRNONAA >60 12/16/2021    Hepatic Lab Results  Component Value Date   AST 21 12/27/2020   ALT 24 12/27/2020   ALBUMIN 3.8 12/27/2020   ALKPHOS 82 12/27/2020    Electrolytes Lab Results  Component Value Date   NA 139 12/28/2021   K 4.2 12/28/2021   CL 105 12/28/2021   CALCIUM 9.7 12/16/2021   MG 1.8 03/12/2015    Bone No results found for: "VD25OH", "VD125OH2TOT", "ML4650PT4", "SF6812XN1", "25OHVITD1", "25OHVITD2", "25OHVITD3", "TESTOFREE", "TESTOSTERONE"  Inflammation (CRP: Acute Phase) (ESR: Chronic Phase) Lab Results  Component Value Date   CRP 0.6 03/12/2015   ESRSEDRATE 9 03/12/2015         Note: Above Lab results reviewed.  Recent Imaging Review  MR FOREARM RIGHT WO CONTRAST CLINICAL DATA:  Right forearm and elbow pain for 6 months. Progressive.  EXAM: MRI OF THE RIGHT FOREARM WITHOUT CONTRAST  TECHNIQUE: Multiplanar, multisequence MR imaging of the right forearm was performed. No intravenous contrast was administered.  COMPARISON:  right elbow and forearm radiographs 11/30/2023w  FINDINGS: Bones/Joint/Cartilage  As described on contemporaneous MRI elbow report, there is moderate osteoarthritis of the trochlea-coronoid and radiocapitellar elbow joints.  There is mild marrow edema within the proximal radial diaphysis, centered between the radial neck in the radial tuberosity. The biceps brachii tendon insertion on the radial tuberosity is intact. No acute fracture is seen. This is nonspecific and may be due to stress related changes. No abnormal marrow edema is seen within the ulna.  Small elbow joint effusion.  Ligaments  High-grade common extensor tendon origin and low-grade common flexor tendon origin tears at the distal  humerus, as described on contemporaneous elbow MRI.  Muscles and Tendons  Normal size of the right forearm musculature. Mild edema within the flexor carpi ulnaris muscle (axial series 15 images 4 through 33). Note is made this is innervated by the ulnar nerve, and there was mild edema and swelling within the ulnar nerve within the cubital tunnel better seen on contemporaneous MRI of the right elbow.  Soft tissues  The rest of the more distal course of the ulnar nerve appears unremarkable.  IMPRESSION: 1. Mild marrow edema within the proximal radial diaphysis, centered between the radial neck and the radial tuberosity. This is nonspecific and may be due to stress related changes. No acute fracture is seen. 2. Mild edema within the flexor carpi ulnaris muscle. Note is made this muscle is innervated by the ulnar nerve, and there was mild edema and swelling seen within the ulnar nerve within the cubital tunnel on contemporaneous MRI of the right elbow. 3. High-grade common extensor tendon origin and low-grade common flexor tendon origin tears at the distal humerus, as described on contemporaneous elbow MRI. 4. Moderate osteoarthritis of the trochlea-coronoid and radiocapitellar joints.  Electronically Signed  By: Neita Garnet M.D.   On: 01/13/2022 10:53 MR ELBOW RIGHT WO CONTRAST CLINICAL DATA:  Chronic right elbow pain. Medial epicondylitis suspected. Bursitis suspected. 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  EXAM: MRI OF THE RIGHT ELBOW WITHOUT CONTRAST  TECHNIQUE: Multiplanar, multisequence MR imaging of the elbow was performed. No intravenous contrast was administered.  COMPARISON:  Right elbow and right forearm radiographs 12/16/2021  FINDINGS: TENDONS  Common forearm flexor origin: There is horizontal linear fluid bright signal at the midsubstance to volar aspect of the common flexor tendon origin (axial series 12, image 16 and  coronal series 11, image 16), measuring up to only 2 mm in transverse dimension but 9 mm in AP dimension and 8 mm along the craniocaudal dimension. No tendon retraction.  Common forearm extensor origin: There is high-grade partial-thickness tearing of the deep aspect of the common extensor tendon origin (coronal series 11 images 13 through 17 and axial series 12 images 13 through 16), involving greater than 75% of the transverse tendon thickness, greater than 75% of the AP tendon thickness, and with retraction of the torn fibers up to approximately 14 mm in the craniocaudal dimension.  Biceps: The biceps brachii and brachialis tendon insertions are intact.  Triceps: The triceps tendon insertion is intact.  LIGAMENTS  Medial stabilizers: Mild intermediate T2 signal sprain at the lateral epicondyle origin of the intercarpal ligament. The distal insertion of the sublime tubercle of the ulna appears intact.  Lateral stabilizers: There appears to be there is a high-grade partial-thickness tear of the radial collateral ligament, contiguous with the high-grade tear of the adjacent volar aspect of the common extensor tendon origin. The radioulnar collateral ligament appears intact.  Cartilage: High-grade partial-thickness thinning of the trochlea-coronoid process and capitellum-radial head cartilage. There is again moderate degenerative spurring at the tip of the coronoid process. Mild radial head-neck junction degenerative spurring is again seen. Moderate degenerative spurring of the proximal radioulnar joint.  Joint: Small joint effusion.  Cubital tunnel: Minimal edema and swelling of the ulnar nerve as it passes through the cubital tunnel (axial series 12, image 14) at the level of the distal humerus.  Normal course and signal of the radial and median nerves.  Bones: No acute fracture.  IMPRESSION: 1. High-grade partial-thickness tear of the deep aspect of the common  extensor tendon origin. 2. High-grade partial-thickness tear of the radial collateral ligament, contiguous with the high-grade tear of the adjacent volar aspect of the common extensor tendon origin. 3. Low-grade partial-thickness tear of the common flexor tendon origin. 4. Moderate elbow osteoarthritis. 5. Minimal edema and swelling of the ulnar nerve as it passes through the cubital tunnel.  Electronically Signed   By: Neita Garnet M.D.   On: 01/13/2022 10:42 Note: Reviewed        Physical Exam  General appearance: Well nourished, well developed, and well hydrated. In no apparent acute distress Mental status: Alert, oriented x 3 (person, place, & time)       Respiratory: No evidence of acute respiratory distress Eyes: PERLA Vitals: There were no vitals taken for this visit. BMI: Estimated body mass index is 38.25 kg/m as calculated from the following:   Height as of 12/29/21: 5\' 6"  (1.676 m).   Weight as of 12/29/21: 236 lb 15.9 oz (107.5 kg). Ideal: Patient weight not recorded  Assessment   Diagnosis Status  No diagnosis found. Controlled Controlled Controlled   Updated Problems: No problems updated.  Plan of Care  Problem-specific:  No problem-specific Assessment & Plan notes found for this encounter.  Mr. Dylan Monforte. has a current medication list which includes the following long-term medication(s): gabapentin and oxycodone-acetaminophen.  Pharmacotherapy (Medications Ordered): No orders of the defined types were placed in this encounter.  Orders:  No orders of the defined types were placed in this encounter.  Follow-up plan:   No follow-ups on file.     Interventional Therapies  Risk  Complexity Considerations:   Estimated body mass index is 38.74 kg/m as calculated from the following:   Height as of this encounter: 5\' 6"  (1.676 m).   Weight as of this encounter: 240 lb (108.9 kg). WNL   Planned  Pending:      Under consideration:    Possible right Suprascapular nerve RFA  Possible left lumbar facet RFA  Diagnostic right knee genicular NB    Completed:   Diagnostic right caudal ESI x1 + epidurogram #1 (10/02/2019)  Palliative right lumbar facet MBB x4 (04/02/2019)  Palliative left lumbar facet MBB x2 (12/16/2015)  Palliative right lumbar facet RFA x1  (Right: 04/13/2016) (100% relief)  Diagnostic/therapeutic bilateral SI joint block x2 (11/17/2016)  Diagnostic right Suprascapular NB x1 (06/22/2016)    Therapeutic  Palliative (PRN) options:   Palliative lumbar facet block  Palliative right lumbar facet RFA #2  Diagnostic Suprascapular nerve Block          Recent Visits Date Type Provider Dept  11/16/21 Office Visit 11/18/21, MD [facility]  11/02/21 Procedure visit 11/04/21, MD [facility]  10/25/21 Office Visit 12/25/21, MD [facility]  Showing recent visits within past 90 days and meeting all other requirements Future Appointments Date Type Provider Dept  01/26/22 Appointment 03/27/22, MD [facility]  Showing future appointments within next 90 days and meeting all other requirements  I discussed the assessment and treatment plan with the patient. The patient was provided an opportunity to ask questions and all were answered. The patient agreed with the plan and demonstrated an understanding of the instructions.  Patient advised to call back or seek an in-person evaluation if the symptoms or condition worsens.  Duration of encounter: *** minutes.  Total time on encounter, as per AMA guidelines included both the face-to-face and non-face-to-face time personally spent by the physician and/or other qualified health care professional(s) on the day of the encounter (includes time in activities that require the physician or other qualified health care professional and does not include time in activities normally performed by clinical  staff). Physician's time may include the following activities when performed: Preparing to see the patient (e.g., pre-charting review of records, searching for previously ordered imaging, lab work, and nerve conduction tests) Review of prior analgesic pharmacotherapies. Reviewing PMP Interpreting ordered tests (e.g., lab work, imaging, nerve conduction tests) Performing post-procedure evaluations, including interpretation of diagnostic procedures Obtaining and/or reviewing separately obtained history Performing a medically appropriate examination and/or evaluation Counseling and educating the patient/family/caregiver Ordering medications, tests, or procedures Referring and communicating with other health care professionals (when not separately reported) Documenting clinical information in the electronic or other health record Independently interpreting results (not separately reported) and communicating results to the patient/ family/caregiver Care coordination (not separately reported)  Note by: Delano Metz, MD Date: 01/26/2022; Time: 6:20 PM

## 2022-01-25 NOTE — Patient Instructions (Signed)
____________________________________________________________________________________________  Patient Information update  To: All of our patients.  Re: Name change.  It has been made official that our current name, "[facility]"   will soon be changed to "[facility]".   The purpose of this change is to eliminate any confusion created by the concept of our practice being a "Medication Management Pain Clinic". In the past this has led to the misconception that we treat pain primarily by the use of prescription medications.  Nothing can be farther from the truth.   Understanding PAIN MANAGEMENT: To further understand what our practice does, you first have to understand that "Pain Management" is a subspecialty that requires additional training once a physician has completed their specialty training, which can be in either Anesthesia, Neurology, Psychiatry, or Physical Medicine and Rehabilitation (PMR). Each one of these contributes to the final approach taken by each physician to the management of their patient's pain. To be a "Pain Management Specialist" you must have first completed one of the specialty trainings below.  Anesthesiologists - trained in clinical pharmacology and interventional techniques such as nerve blockade and regional as well as central neuroanatomy. They are trained to block pain before, during, and after surgical interventions.  Neurologists - trained in the diagnosis and pharmacological treatment of complex neurological conditions, such as Multiple Sclerosis, Parkinson's, spinal cord injuries, and other systemic conditions that may be associated with symptoms that may include but are not limited to pain. They tend to rely primarily on the treatment of chronic pain using prescription medications.  Psychiatrist - trained in conditions affecting the psychosocial  wellbeing of patients including but not limited to depression, anxiety, schizophrenia, personality disorders, addiction, and other substance use disorders that may be associated with chronic pain. They tend to rely primarily on the treatment of chronic pain using prescription medications.   Physical Medicine and Rehabilitation (PMR) physicians, also known as physiatrists - trained to treat a wide variety of medical conditions affecting the brain, spinal cord, nerves, bones, joints, ligaments, muscles, and tendons. Their training is primarily aimed at treating patients that have suffered injuries that have caused severe physical impairment. Their training is primarily aimed at the physical therapy and rehabilitation of those patients. They may also work alongside orthopedic surgeons or neurosurgeons using their expertise in assisting surgical patients to recover after their surgeries.  INTERVENTIONAL PAIN MANAGEMENT is sub-subspecialty of Pain Management.  Our physicians are Board-certified in Anesthesia, Pain Management, and Interventional Pain Management.  This meaning that not only have they been trained and Board-certified in their specialty of Anesthesia, and subspecialty of Pain Management, but they have also received further training in the sub-subspecialty of Interventional Pain Management, in order to become Board-certified as INTERVENTIONAL PAIN MANAGEMENT SPECIALIST.    Mission: Our goal is to use our skills in  INTERVENTIONAL PAIN MANAGEMENT as alternatives to the chronic use of prescription opioid medications for the treatment of pain. To make this more clear, we have changed our name to reflect what we do and offer. We will continue to offer medication management assessment and recommendations, but we will not be taking over any patient's medication management.  ____________________________________________________________________________________________      ____________________________________________________________________________________________  National Pain Medication Shortage  The [place] is experiencing worsening drug shortages. These have had a negative widespread effect on patient care and treatment. Not expected to improve any time soon. Predicted to last past 2029.   Drug shortage list (generic   names) Oxycodone IR Oxycodone/APAP Oxymorphone IR Hydromorphone Hydrocodone/APAP Morphine  Where is the problem?  Manufacturing and supply level.  Will this shortage affect you?  Only if you take any of the above pain medications.  How? You may be unable to fill your prescription.  Your pharmacist may offer a "partial fill" of your prescription. (Warning: Do not accept partial fills.) Prescriptions partially filled cannot be transferred to another pharmacy. Read our Medication Rules and Regulation. Depending on how much medicine you are dependent on, you may experience withdrawals when unable to get the medication.  Recommendations: Consider ending your dependence on opioid pain medications. Ask your pain specialist to assist you with the process. Consider switching to a medication currently not in shortage, such as Buprenorphine. Talk to your pain specialist about this option. Consider decreasing your pain medication requirements by managing tolerance thru "Drug Holidays". This may help minimize withdrawals, should you run out of medicine. Control your pain thru the use of non-pharmacological interventional therapies.   Your prescriber: Prescribers cannot be blamed for shortages. Medication manufacturing and supply issues cannot be fixed by the prescriber.   NOTE: The prescriber is not responsible for supplying the medication, or solving supply issues. Work with your pharmacist to solve it. The patient is responsible for the decision to take or continue taking the medication and for identifying and securing a legal supply source. By  law, supplying the medication is the job and responsibility of the pharmacy. The prescriber is responsible for the evaluation, monitoring, and prescribing of these medications.   Prescribers will NOT: Re-issue prescriptions that have been partially filled. Re-issue prescriptions already sent to a pharmacy.  Re-send prescriptions to a different pharmacy because yours did not have your medication. Ask pharmacist to order more medicine or transfer the prescription to another pharmacy. (Read below.)  New 2023 regulation: "September 17, 2021 Revised Regulation Allows DEA-Registered Pharmacies to Transfer Electronic Prescriptions at a Patient's Request DEA Headquarters Division - Public Information Office Patients now have the ability to request their electronic prescription be transferred to another pharmacy without having to go back to their practitioner to initiate the request. This revised regulation went into effect on Monday, September 13, 2021.     At a patient's request, a DEA-registered retail pharmacy can now transfer an electronic prescription for a controlled substance (schedules II-V) to another DEA-registered retail pharmacy. Prior to this change, patients would have to go through their practitioner to cancel their prescription and have it re-issued to a different pharmacy. The process was taxing and time consuming for both patients and practitioners.    The Drug Enforcement Administration (DEA) published its intent to revise the process for transferring electronic prescriptions on December 06, 2019.  The final rule was published in the federal register on August 12, 2021 and went into effect 30 days later.  Under the final rule, a prescription can only be transferred once between pharmacies, and only if allowed under existing state or other applicable law. The prescription must remain in its electronic form; may not be altered in any way; and the transfer must be communicated directly between  two licensed pharmacists. It's important to note, any authorized refills transfer with the original prescription, which means the entire prescription will be filled at the same pharmacy".  Reference: https://www.dea.gov/stories/2023/2023-09/2021-09-01/revised-regulation-allows-dea-registered-pharmacies-transfer (DEA website announcement)  https://www.govinfo.gov/content/pkg/FR-2021-08-12/pdf/2023-15847.pdf (Federal Register  Department of Justice)   Federal Register / Vol. 88, No. 143 / Thursday, August 12, 2021 / Rules and Regulations DEPARTMENT OF JUSTICE  Drug Enforcement   Administration  21 CFR Part 1306  [Docket No. [id number]]  RIN 1117-AB64 Transfer of Electronic Prescriptions for Schedules II-V Controlled Substances Between Pharmacies for Initial Filling  ____________________________________________________________________________________________     ____________________________________________________________________________________________  Drug Holidays  What is a "Drug Holiday"? Drug Holiday: is the name given to the process of slowly tapering down and temporarily stopping the pain medication for the purpose of decreasing or eliminating tolerance to the drug.  Benefits Improved effectiveness Decreased required effective dose Improved pain control End dependence on high dose therapy Decrease cost of therapy Uncovering "opioid-induced hyperalgesia". (OIH)  What is "opioid hyperalgesia"? It is a paradoxical increase in pain caused by exposure to opioids. Stopping the opioid pain medication, contrary to the expected, it actually decreases or completely eliminates the pain. Ref.: "A comprehensive review of opioid-induced hyperalgesia". Marion Lee, et.al. Pain Physician. 2011 Mar-Apr;14(2):145-61.  What is tolerance? Tolerance: the progressive loss of effectiveness of a pain medicine due to repetitive use. A common problem of opioid pain medications.  How long should a "Drug  Holiday" last? Effectiveness depends on the patient staying off all opioid pain medicines for a minimum of 14 consecutive days. (2 weeks)  How about just taking less of the medicine? Does not work. Will not accomplish goal of eliminating the excess receptors.  How about switching to a different pain medicine? (AKA. "Opioid rotation") Does not work. Creates the illusion of effectiveness by taking advantage of inaccurate equivalent dose calculations between different opioids. -This "technique" was promoted by studies funded by pharmaceutical companies, such as [employer], creators of "OxyContin".  Can I stop the medicine "cold turkey"? Depends. You should always coordinate with your Pain Specialist to make the transition as smoothly as possible. Avoid stopping the medicine abruptly without consulting. We recommend a "slow taper".  What is a slow taper? Taper: refers to the gradual decrease in dose.   How do I stop/taper the dose? Slowly. Decrease the daily amount of pills that you take by one (1) pill every seven (7) days. This is called a "slow downward taper". Example: if you normally take four (4) pills per day, drop it to three (3) pills per day for seven (7) days, then to two (2) pills per day for seven (7) days, then to one (1) per day for seven (7) days, and then stop the medicine. The 14 day "Drug Holiday" starts on the first day without medicine.   Will I experience withdrawals? Unlikely with a slow taper.  What triggers withdrawals? Withdrawals are triggered by the sudden/abrupt stop of high dose opioids. Withdrawals can be avoided by slowly decreasing the dose over a prolonged period of time.  What are withdrawals? Symptoms associated with sudden/abrupt reduction/stopping of high-dose, long-term use of pain medication. Withdrawal are seldom seen on low dose therapy, or patients rarely taking opioid medication.  Early Withdrawal Symptoms may include: Agitation Anxiety Muscle  aches Increased tearing Insomnia Runny nose Sweating Yawning  Late symptoms may include: Abdominal cramping Diarrhea Dilated pupils Goose bumps Nausea Vomiting  (Last update: 12/26/2021) ____________________________________________________________________________________________    _______________________________________________________________________  Medication Rules  Purpose: To inform patients, and their family members, of our medication rules and regulations.  Applies to: All patients receiving prescriptions from our practice (written or electronic).  Pharmacy of record: This is the pharmacy where your electronic prescriptions will be sent. Make sure we have the correct one.  Electronic prescriptions: In compliance with the [place] Strengthen Opioid Misuse Prevention (STOP) Act of 2017 (Session Law 2017-74/H243), effective January 17, 2018, all controlled substances must be electronically prescribed. Written prescriptions, faxing, or calling prescriptions to a pharmacy will no longer be done.  Prescription refills: These will be provided only during in-person appointments. No medications will be renewed without a "face-to-face" evaluation with your provider. Applies to all prescriptions.  NOTE: The following applies primarily to controlled substances (Opioid* Pain Medications).   Type of encounter (visit): For patients receiving controlled substances, face-to-face visits are required. (Not an option and not up to the patient.)  Patient's responsibilities: Pain Pills: Bring all pain pills to every appointment (except for procedure appointments). Pill Bottles: Bring pills in original pharmacy bottle. Bring bottle, even if empty. Always bring the bottle of the most recent fill.  Medication refills: You are responsible for knowing and keeping track of what medications you are taking and when is it that you will need a refill. The day before your appointment: write a  list of all prescriptions that need to be refilled. The day of the appointment: give the list to the admitting nurse. Prescriptions will be written only during appointments. No prescriptions will be written on procedure days. If you forget a medication: it will not be "Called in", "Faxed", or "electronically sent". You will need to get another appointment to get these prescribed. No early refills. Do not call asking to have your prescription filled early. Partial  or short prescriptions: Occasionally your pharmacy may not have enough pills to fill your prescription.  NEVER ACCEPT a partial fill or a prescription that is short of the total amount of pills that you were prescribed.  With controlled substances the law allows 72 hours for the pharmacy to complete the prescription.  If the prescription is not completed within 72 hours, the pharmacist will require a new prescription to be written. This means that you will be short on your medicine and we WILL NOT send another prescription to complete your original prescription.  Instead, request the pharmacy to send a carrier to a nearby branch to get enough medication to provide you with your full prescription. Prescription Accuracy: You are responsible for carefully inspecting your prescriptions before leaving our office. Have the discharge nurse carefully go over each prescription with you, before taking them home. Make sure that your name is accurately spelled, that your address is correct. Check the name and dose of your medication to make sure it is accurate. Check the number of pills, and the written instructions to make sure they are clear and accurate. Make sure that you are given enough medication to last until your next medication refill appointment. Taking Medication: Take medication as prescribed. When it comes to controlled substances, taking less pills or less frequently than prescribed is permitted and encouraged. Never take more pills than  instructed. Never take the medication more frequently than prescribed.  Inform other Doctors: Always inform, all of your healthcare providers, of all the medications you take. Pain Medication from other Providers: You are not allowed to accept any additional pain medication from any other Doctor or Healthcare provider. There are two exceptions to this rule. (see below) In the event that you require additional pain medication, you are responsible for notifying us, as stated below. Cough Medicine: Often these contain an opioid, such as codeine or hydrocodone. Never accept or take cough medicine containing these opioids if you are already taking an opioid* medication. The combination may cause respiratory failure and death. Medication Agreement: You are responsible for carefully reading and following our Medication Agreement. This   must be signed before receiving any prescriptions from our practice. Safely store a copy of your signed Agreement. Violations to the Agreement will result in no further prescriptions. (Additional copies of our Medication Agreement are available upon request.) Laws, Rules, & Regulations: All patients are expected to follow all Federal and State Laws, Statutes, Rules, & Regulations. Ignorance of the Laws does not constitute a valid excuse.  Illegal drugs and Controlled Substances: The use of illegal substances (including, but not limited to marijuana and its derivatives) and/or the illegal use of any controlled substances is strictly prohibited. Violation of this rule may result in the immediate and permanent discontinuation of any and all prescriptions being written by our practice. The use of any illegal substances is prohibited. Adopted CDC guidelines & recommendations: Target dosing levels will be at or below 60 MME/day. Use of benzodiazepines** is not recommended.  Exceptions: There are only two exceptions to the rule of not receiving pain medications from other Healthcare  Providers. Exception #1 (Emergencies): In the event of an emergency (i.e.: accident requiring emergency care), you are allowed to receive additional pain medication. However, you are responsible for: As soon as you are able, call our office [phone], at any time of the day or night, and leave a message stating your name, the date and nature of the emergency, and the name and dose of the medication prescribed. In the event that your call is answered by a member of our staff, make sure to document and save the date, time, and the name of the person that took your information.  Exception #2 (Planned Surgery): In the event that you are scheduled by another doctor or dentist to have any type of surgery or procedure, you are allowed (for a period no longer than 30 days), to receive additional pain medication, for the acute post-op pain. However, in this case, you are responsible for picking up a copy of our "Post-op Pain Management for Surgeons" handout, and giving it to your surgeon or dentist. This document is available at our office, and does not require an appointment to obtain it. Simply go to our office during business hours (Monday-Thursday from 8:00 AM to 4:00 PM) (Friday 8:00 AM to 12:00 Noon) or if you have a scheduled appointment with us, prior to your surgery, and ask for it by name. In addition, you are responsible for: calling our office [phone], at any time of the day or night, and leaving a message stating your name, name of your surgeon, type of surgery, and date of procedure or surgery. Failure to comply with your responsibilities may result in termination of therapy involving the controlled substances. Medication Agreement Violation. Following the above rules, including your responsibilities will help you in avoiding a Medication Agreement Violation ("Breaking your Pain Medication Contract").  Consequences:  Not following the above rules may result in permanent discontinuation of  medication prescription therapy.  *Opioid medications include: morphine, codeine, oxycodone, oxymorphone, hydrocodone, hydromorphone, meperidine, tramadol, tapentadol, buprenorphine, fentanyl, methadone. **Benzodiazepine medications include: diazepam (Valium), alprazolam (Xanax), clonazepam (Klonopine), lorazepam (Ativan), clorazepate (Tranxene), chlordiazepoxide (Librium), estazolam (Prosom), oxazepam (Serax), temazepam (Restoril), triazolam (Halcion) (Last updated: 11/09/2021) ______________________________________________________________________    ______________________________________________________________________  Medication Recommendations and Reminders  Applies to: All patients receiving prescriptions (written and/or electronic).  Medication Rules & Regulations: You are responsible for reading, knowing, and following our "Medication Rules" document. These exist for your safety and that of others. They are not flexible and neither are we. Dismissing or ignoring them is an   act of "non-compliance" that may result in complete and irreversible termination of such medication therapy. For safety reasons, "non-compliance" will not be tolerated. As with the [place] fundamental legal principle of "ignorance of the law is no defense", we will accept no excuses for not having read and knowing the content of documents provided to you by our practice.  Pharmacy of record:  Definition: This is the pharmacy where your electronic prescriptions will be sent.  We do not endorse any particular pharmacy. It is up to you and your insurance to decide what pharmacy to use.  We do not restrict you in your choice of pharmacy. However, once we write for your prescriptions, we will NOT be re-sending more prescriptions to fix restricted supply problems created by your pharmacy, or your insurance.  The pharmacy listed in the electronic medical record should be the one where you want electronic prescriptions to be  sent. If you choose to change pharmacy, simply notify our nursing staff. Changes will be made only during your regular appointments and not over the phone.  Recommendations: Keep all of your pain medications in a safe place, under lock and key, even if you live alone. We will NOT replace lost, stolen, or damaged medication. We do not accept "Police Reports" as proof of medications having been stolen. After you fill your prescription, take 1 week's worth of pills and put them away in a safe place. You should keep a separate, properly labeled bottle for this purpose. The remainder should be kept in the original bottle. Use this as your primary supply, until it runs out. Once it's gone, then you know that you have 1 week's worth of medicine, and it is time to come in for a prescription refill. If you do this correctly, it is unlikely that you will ever run out of medicine. To make sure that the above recommendation works, it is very important that you make sure your medication refill appointments are scheduled at least 1 week before you run out of medicine. To do this in an effective manner, make sure that you do not leave the office without scheduling your next medication management appointment. Always ask the nursing staff to show you in your prescription , when your medication will be running out. Then arrange for the receptionist to get you a return appointment, at least 7 days before you run out of medicine. Do not wait until you have 1 or 2 pills left, to come in. This is very poor planning and does not take into consideration that we may need to cancel appointments due to bad weather, sickness, or emergencies affecting our staff. DO NOT ACCEPT A "Partial Fill": If for any reason your pharmacy does not have enough pills/tablets to completely fill or refill your prescription, do not allow for a "partial fill". The law allows the pharmacy to complete that prescription within 72 hours, without requiring a new  prescription. If they do not fill the rest of your prescription within those 72 hours, you will need a separate prescription to fill the remaining amount, which we will NOT provide. If the reason for the partial fill is your insurance, you will need to talk to the pharmacist about payment alternatives for the remaining tablets, but again, DO NOT ACCEPT A PARTIAL FILL, unless you can trust your pharmacist to obtain the remainder of the pills within 72 hours.  Prescription refills and/or changes in medication(s):  Prescription refills, and/or changes in dose or medication, will be conducted only   during scheduled medication management appointments. (Applies to both, written and electronic prescriptions.) No refills on procedure days. No medication will be changed or started on procedure days. No changes, adjustments, and/or refills will be conducted on a procedure day. Doing so will interfere with the diagnostic portion of the procedure. No phone refills. No medications will be "called into the pharmacy". No Fax refills. No weekend refills. No Holliday refills. No after hours refills.  Remember:  Business hours are:  Monday to Thursday 8:00 AM to 4:00 PM Provider's Schedule: Mikolaj Woolstenhulme, MD - Appointments are:  Medication management: Monday and Wednesday 8:00 AM to 4:00 PM Procedure day: Tuesday and Thursday 7:30 AM to 4:00 PM Bilal Lateef, MD - Appointments are:  Medication management: Tuesday and Thursday 8:00 AM to 4:00 PM Procedure day: Monday and Wednesday 7:30 AM to 4:00 PM (Last update: 11/09/2021) ______________________________________________________________________    ____________________________________________________________________________________________  WARNING: CBD (cannabidiol) & Delta (Delta-8 tetrahydrocannabinol) products.   Applicable to:  All individuals currently taking or considering taking CBD (cannabidiol) and, more important, all patients taking opioid  analgesic controlled substances (pain medication). (Example: oxycodone; oxymorphone; hydrocodone; hydromorphone; morphine; methadone; tramadol; tapentadol; fentanyl; buprenorphine; butorphanol; dextromethorphan; meperidine; codeine; etc.)  Introduction:  Recently there has been a drive towards the use of "natural" products for the treatment of different conditions, including pain anxiety and sleep disorders. Marijuana and hemp are two varieties of the cannabis genus plants. Marijuana and its derivatives are illegal, while hemp and its derivatives are not. Cannabidiol (CBD) and tetrahydrocannabinol (THC), are two natural compounds found in plants of the Cannabis genus. They can both be extracted from hemp or marijuana. Both compounds interact with your body's endocannabinoid system in very different ways. CBD is associated with pain relief (analgesia) while THC is associated with the psychoactive effects ("the high") obtained from the use of marijuana products. There are two main types of THC: Delta-9, which comes from the marijuana plant and it is illegal, and Delta-8, which comes from the hemp plant, and it is legal. (Both, Delta-9-THC and Delta-8-THC are psychoactive and give you "the high".)   Legality:  Marijuana and its derivatives: illegal Hemp and its derivatives: Legal (State dependent) UPDATE: (03/05/2021) The Drug Enforcement Agency (DEA) issued a letter stating that "delta" cannabinoids, including Delta-8-THCO and Delta-9-THCO, synthetically derived from hemp do not qualify as hemp and will be viewed as Schedule I drugs. (Schedule I drugs, substances, or chemicals are defined as drugs with no currently accepted medical use and a high potential for abuse. Some examples of Schedule I drugs are: heroin, lysergic acid diethylamide (LSD), marijuana (cannabis), 3,4-methylenedioxymethamphetamine (ecstasy), methaqualone, and peyote.) (https://www.dea.gov)  Legal status of CBD in [place]:  "Conditionally  Legal"  Reference: "FDA Regulation of Cannabis and Cannabis-Derived Products, Including Cannabidiol (CBD)" - https://www.fda.gov/news-events/public-health-focus/fda-regulation-cannabis-and-cannabis-derived-products-including-cannabidiol-cbd  Warning:  CBD is not FDA approved and has not undergo the same manufacturing controls as prescription drugs.  This means that the purity and safety of available CBD may be questionable. Most of the time, despite manufacturer's claims, it is contaminated with THC (delta-9-tetrahydrocannabinol - the chemical in marijuana responsible for the "HIGH").  When this is the case, the THC contaminant will trigger a positive urine drug screen (UDS) test for Marijuana (carboxy-THC).   The FDA recently put out a warning about 5 things that everyone should be aware of regarding Delta-8 THC: Delta-8 THC products have not been evaluated or approved by the FDA for safe use and may be marketed in ways that put the public health at   risk. The FDA has received adverse event reports involving delta-8 THC-containing products. Delta-8 THC has psychoactive and intoxicating effects. Delta-8 THC manufacturing often involve use of potentially harmful chemicals to create the concentrations of delta-8 THC claimed in the marketplace. The final delta-8 THC product may have potentially harmful by-products (contaminants) due to the chemicals used in the process. Manufacturing of delta-8 THC products may occur in uncontrolled or unsanitary settings, which may lead to the presence of unsafe contaminants or other potentially harmful substances. Delta-8 THC products should be kept out of the reach of children and pets.  NOTE: Because a positive UDS for any illicit substance is a violation of our medication agreement, your opioid analgesics (pain medicine) may be permanently discontinued.  MORE ABOUT CBD  General Information: CBD was discovered in 1940 and it is a derivative of the cannabis sativa  genus plants (Marijuana and Hemp). It is one of the 113 identified substances found in Marijuana. It accounts for up to 40% of the plant's extract. As of 2018, preliminary clinical studies on CBD included research for the treatment of anxiety, movement disorders, and pain. CBD is available and consumed in multiple forms, including inhalation of smoke or vapor, as an aerosol spray, and by mouth. It may be supplied as an oil containing CBD, capsules, dried cannabis, or as a liquid solution. CBD is thought not to be as psychoactive as THC (delta-9-tetrahydrocannabinol - the chemical in marijuana responsible for the "HIGH"). Studies suggest that CBD may interact with different biological target receptors in the body, including cannabinoid and other neurotransmitter receptors. As of 2018 the mechanism of action for its biological effects has not been determined.  Side-effects  Adverse reactions: Dry mouth, diarrhea, decreased appetite, fatigue, drowsiness, malaise, weakness, sleep disturbances, and others.  Drug interactions:  CBD may interact with medications such as blood-thinners. CBD causes drowsiness on its own and it will increase drowsiness caused by other medications, including antihistamines (such as Benadryl), benzodiazepines (Xanax, Ativan, Valium), antipsychotics, antidepressants, opioids, alcohol and supplements such as kava, melatonin and St. John's Wort.  Other drug interactions: Brivaracetam (Briviact); Caffeine; Carbamazepine (Tegretol); Citalopram (Celexa); Clobazam (Onfi); Eslicarbazepine (Aptiom); Everolimus (Zostress); Lithium; Methadone (Dolophine); Rufinamide (Banzel); Sedative medications (CNS depressants); Sirolimus (Rapamune); Stiripentol (Diacomit); Tacrolimus (Prograf); Tamoxifen ; Soltamox); Topiramate (Topamax); Valproate; Warfarin (Coumadin); Zonisamide. (Last update: 12/27/2021) ____________________________________________________________________________________________    ____________________________________________________________________________________________  Naloxone Nasal Spray  Why am I receiving this medication? [place] STOP ACT requires that all patients taking high dose opioids or at risk of opioids respiratory depression, be prescribed an opioid reversal agent, such as Naloxone (AKA: Narcan).  What is this medication? NALOXONE (nal OX one) treats opioid overdose, which causes slow or shallow breathing, severe drowsiness, or trouble staying awake. Call emergency services after using this medication. You may need additional treatment. Naloxone works by reversing the effects of opioids. It belongs to a group of medications called opioid blockers.  COMMON BRAND NAME(S): Kloxxado, Narcan  What should I tell my care team before I take this medication? They need to know if you have any of these conditions: Heart disease Substance use disorder An unusual or allergic reaction to naloxone, other medications, foods, dyes, or preservatives Pregnant or trying to get pregnant Breast-feeding  When to use this medication? This medication is to be used for the treatment of respiratory depression (less than 8 breaths per minute) secondary to opioid overdose.   How to use this medication? This medication is for use in the nose. Lay the person on their   back. Support their neck with your hand and allow the head to tilt back before giving the medication. The nasal spray should be given into 1 nostril. After giving the medication, move the person onto their side. Do not remove or test the nasal spray until ready to use. Get emergency medical help right away after giving the first dose of this medication, even if the person wakes up. You should be familiar with how to recognize the signs and symptoms of a narcotic overdose. If more doses are needed, give the additional dose in the other nostril. Talk to your care team about the use of this medication in children.  While this medication may be prescribed for children as young as newborns for selected conditions, precautions do apply.  Naloxone Overdosage: If you think you have taken too much of this medicine contact a poison control center or emergency room at once.  NOTE: This medicine is only for you. Do not share this medicine with others.  What if I miss a dose? This does not apply.  What may interact with this medication? This is only used during an emergency. No interactions are expected during emergency use. This list may not describe all possible interactions. Give your health care provider a list of all the medicines, herbs, non-prescription drugs, or dietary supplements you use. Also tell them if you smoke, drink alcohol, or use illegal drugs. Some items may interact with your medicine.  What should I watch for while using this medication? Keep this medication ready for use in the case of an opioid overdose. Make sure that you have the phone number of your care team and local hospital ready. You may need to have additional doses of this medication. Each nasal spray contains a single dose. Some emergencies may require additional doses. After use, bring the treated person to the nearest hospital or call 911. Make sure the treating care team knows that the person has received a dose of this medication. You will receive additional instructions on what to do during and after use of this medication before an emergency occurs.  What side effects may I notice from receiving this medication? Side effects that you should report to your care team as soon as possible: Allergic reactions--skin rash, itching, hives, swelling of the face, lips, tongue, or throat Side effects that usually do not require medical attention (report these to your care team if they continue or are bothersome): Constipation Dryness or irritation inside the nose Headache Increase in blood pressure Muscle spasms Stuffy  nose Toothache This list may not describe all possible side effects. Call your doctor for medical advice about side effects. You may report side effects to FDA at 1-800-FDA-1088.  Where should I keep my medication? Because this is an emergency medication, you should keep it with you at all times.  Keep out of the reach of children and pets. Store between 20 and 25 degrees C (68 and 77 degrees F). Do not freeze. Throw away any unused medication after the expiration date. Keep in original box until ready to use.  NOTE: This sheet is a summary. It may not cover all possible information. If you have questions about this medicine, talk to your doctor, pharmacist, or health care provider.   2023 Elsevier/Gold Standard (2020-09-11 00:00:00)  ____________________________________________________________________________________________   

## 2022-01-26 ENCOUNTER — Ambulatory Visit: Payer: BC Managed Care – PPO | Attending: Pain Medicine | Admitting: Pain Medicine

## 2022-01-26 ENCOUNTER — Encounter: Payer: Self-pay | Admitting: Pain Medicine

## 2022-01-26 VITALS — BP 133/78 | HR 100 | Temp 98.2°F | Resp 18 | Ht 66.0 in | Wt 220.0 lb

## 2022-01-26 DIAGNOSIS — Z79891 Long term (current) use of opiate analgesic: Secondary | ICD-10-CM

## 2022-01-26 DIAGNOSIS — M545 Low back pain, unspecified: Secondary | ICD-10-CM | POA: Insufficient documentation

## 2022-01-26 DIAGNOSIS — Z79899 Other long term (current) drug therapy: Secondary | ICD-10-CM

## 2022-01-26 DIAGNOSIS — M25561 Pain in right knee: Secondary | ICD-10-CM | POA: Insufficient documentation

## 2022-01-26 DIAGNOSIS — G894 Chronic pain syndrome: Secondary | ICD-10-CM

## 2022-01-26 DIAGNOSIS — G8929 Other chronic pain: Secondary | ICD-10-CM

## 2022-01-26 DIAGNOSIS — M5126 Other intervertebral disc displacement, lumbar region: Secondary | ICD-10-CM | POA: Diagnosis present

## 2022-01-26 DIAGNOSIS — M961 Postlaminectomy syndrome, not elsewhere classified: Secondary | ICD-10-CM

## 2022-01-26 DIAGNOSIS — M79604 Pain in right leg: Secondary | ICD-10-CM | POA: Insufficient documentation

## 2022-01-26 DIAGNOSIS — M25511 Pain in right shoulder: Secondary | ICD-10-CM | POA: Diagnosis not present

## 2022-01-26 DIAGNOSIS — M5136 Other intervertebral disc degeneration, lumbar region with discogenic back pain only: Secondary | ICD-10-CM

## 2022-01-26 MED ORDER — OXYCODONE HCL 5 MG PO TABS: 5.0000 mg | ORAL_TABLET | Freq: Four times a day (QID) | ORAL | 0 refills | Status: DC | PRN

## 2022-01-26 NOTE — Progress Notes (Signed)
Nursing Pain Medication Assessment:  Safety precautions to be maintained throughout the outpatient stay will include: orient to surroundings, keep bed in low position, maintain call bell within reach at all times, provide assistance with transfer out of bed and ambulation.  Medication Inspection Compliance: Pill count conducted under aseptic conditions, in front of the patient. Neither the pills nor the bottle was removed from the patient's sight at any time. Once count was completed pills were immediately returned to the patient in their original bottle.  Medication: Oxycodone/APAP Pill/Patch Count:  40 of 120 pills remain Pill/Patch Appearance: Markings consistent with prescribed medication Bottle Appearance: Standard pharmacy container. Clearly labeled. Filled Date: 67 / 18 / 2023 Last Medication intake:  Today  Oxycodone 10 mg 15/30 Filled 01-19-2022 today

## 2022-02-02 ENCOUNTER — Other Ambulatory Visit: Payer: Self-pay | Admitting: *Deleted

## 2022-02-02 ENCOUNTER — Telehealth: Payer: Self-pay | Admitting: Student in an Organized Health Care Education/Training Program

## 2022-02-02 ENCOUNTER — Telehealth: Payer: Self-pay | Admitting: *Deleted

## 2022-02-02 DIAGNOSIS — M5136 Other intervertebral disc degeneration, lumbar region with discogenic back pain only: Secondary | ICD-10-CM

## 2022-02-02 DIAGNOSIS — Z79899 Other long term (current) drug therapy: Secondary | ICD-10-CM

## 2022-02-02 DIAGNOSIS — G894 Chronic pain syndrome: Secondary | ICD-10-CM

## 2022-02-02 DIAGNOSIS — M961 Postlaminectomy syndrome, not elsewhere classified: Secondary | ICD-10-CM

## 2022-02-02 DIAGNOSIS — M5126 Other intervertebral disc displacement, lumbar region: Secondary | ICD-10-CM

## 2022-02-02 DIAGNOSIS — Z79891 Long term (current) use of opiate analgesic: Secondary | ICD-10-CM

## 2022-02-02 DIAGNOSIS — M545 Low back pain, unspecified: Secondary | ICD-10-CM

## 2022-02-02 DIAGNOSIS — G8929 Other chronic pain: Secondary | ICD-10-CM

## 2022-02-02 MED ORDER — OXYCODONE HCL 5 MG PO TABS: 5.0000 mg | ORAL_TABLET | Freq: Four times a day (QID) | ORAL | 0 refills | Status: DC | PRN

## 2022-02-02 NOTE — Telephone Encounter (Signed)
Adam Petty called stating his Oxycodone 5mg  was supposed to be sent to [facility]. It was sent to [facility] and they say he can't fill it that it is too early. He also has a script for 10 for post surgery pain from his surgeon. This may be causing confusion. But Dr Dossie Arbour was supposed to send to [facility]. Please call pharmacies and advise pateint. He only has 2 pain pills left and needs to fill today.

## 2022-02-02 NOTE — Telephone Encounter (Signed)
Cancelled 3 prescriptions for Oxycodone at [facility], dates 02-02-22, 03-04-22, and3-17-24. Patient notified that new scripts have been sent to [facility].

## 2022-02-02 NOTE — Telephone Encounter (Signed)
Called [facility], meds may be filled today. Rx request sent to Dr. Dossie Arbour for transfer to [facility].

## 2022-03-07 NOTE — Anesthesia Preprocedure Evaluation (Signed)
Anesthesia Evaluation  Patient identified by MRN, date of birth, ID band Patient awake    Reviewed: Allergy & Precautions, NPO status , Patient's Chart, lab work & pertinent test results  History of Anesthesia Complications Negative for: history of anesthetic complications  Airway Mallampati: III  TM Distance: <3 FB Neck ROM: full    Dental  (+) Chipped   Pulmonary neg shortness of breath, neg sleep apnea had UPPP surgery and now sleep apnea resolved   Pulmonary exam normal        Cardiovascular Exercise Tolerance: Good hypertension, (-) angina (-) Past MI Normal cardiovascular exam     Neuro/Psych  Headaches history spinal stenosis, DDD Long term current use of opiate analgesic  Neuromuscular disease  negative psych ROS   GI/Hepatic negative GI ROS, Neg liver ROS,,,  Endo/Other  diabetes, Insulin Dependent    Renal/GU negative Renal ROS     Musculoskeletal  (+) Arthritis ,    Abdominal   Peds  Hematology negative hematology ROS (+)   Anesthesia Other Findings Past Medical History: 10/22/2014: Arthritis, senescent No date: Back pain     Comment:  history spinal stenosis, DDD 10/22/2014: Chronic pain syndrome No date: DJD (degenerative joint disease) No date: Dyslipidemia No date: Essential hypertension No date: History of kidney stones No date: Hypercholesterolemia No date: IBS (irritable bowel syndrome) No date: Lateral meniscus tear     Comment:  rt knee No date: Osteoarthritis No date: Preventative health care     Comment:  takes crestor/ altace for "preventative reasons" denies               heart or BP problems No date: Sleep apnea     Comment:  had UPPP surgery and now sleep apnea resolved No date: Spinal stenosis No date: Type 2 diabetes mellitus without complication ([place])  Past Surgical History: 1995: CERVICAL FUSION 01/24/2003: COLONOSCOPY 01/16/2018: COLONOSCOPY 02/27/2018: CTR; Left      Comment:  CTR and trigger finger repair 11/19/2015: CYSTOSCOPY W/ RETROGRADES; Right     Comment:  Procedure: URETEROSCOPY WITH RETROGRADE PYELOGRAM;                Surgeon: Royston Cowper, MD;  Location: [facility] OR[facility];                Service: Urology;  Laterality: Right; 10/03/2017: CYSTOSCOPY W/ RETROGRADES; Right     Comment:  Procedure: CYSTOSCOPY WITH RETROGRADE PYELOGRAM;                Surgeon: Royston Cowper, MD;  Location: [facility] ORS;                Service: Urology;  Laterality: Right; No date: FOOT SURGERY     Comment:  RT 10/02/2013: KNEE ARTHROSCOPY; Right     Comment:  Procedure: RIGHT KNEE ARTHROSCOPY, PARTIAL LATERAL               MENISCECTOMY, DEBRIDEMENT, MEDIAL AND LATERAL               CHONDROPLASTY;  Surgeon: Gearlean Alf, MD;  Location:               [facility];  Service: Orthopedics;  Laterality: Right; 03/25/2015: KNEE ARTHROSCOPY WITH MEDIAL MENISECTOMY; Right     Comment:  Procedure: RIGHT KNEE ARTHROSCOPY WITH MENISCAL               DEBRIDEMENT and chrodroplasty;  Surgeon: Gaynelle Arabian,  MD;  Location: [facility];  Service: Orthopedics;                Laterality: Right; 03/25/2015: KNEE BURSECTOMY; Right     Comment:  Procedure: RIGHT KNEE PREPATELLA BURSECTOMY;  Surgeon:               Gaynelle Arabian, MD;  Location: [facility];  Service:               Orthopedics;  Laterality: Right; 02/06/2013: LUMBAR LAMINECTOMY/DECOMPRESSION MICRODISCECTOMY; N/A     Comment:  Procedure: MICRO LUMBAR DECOMPRESSION L3-L4 and L4 - L5               ;  Surgeon: Johnn Hai, MD;  Location: [facility];                Service: Orthopedics;  Laterality: N/A; No date: PALATE SURGERY     Comment:  surg for sleep apnea and sinus issues No date: ROTATOR CUFF REPAIR; Right     Comment:  x3 No date: ROTATOR CUFF REPAIR; Left No date: SHOULDER SURGERY; Right     Comment:  spur removal x2 No date: TONSILLECTOMY 10/03/2017: URETEROSCOPY; Right     Comment:  Procedure: URETEROSCOPY;   Surgeon: Royston Cowper, MD;              Location: [facility] OR[facility];  Service: Urology;  Laterality:               Right;  BMI    Body Mass Index: 38.25 kg/m      Reproductive/Obstetrics negative OB ROS                             Anesthesia Physical Anesthesia Plan  ASA: 3  Anesthesia Plan: General ETT   Post-op Pain Management: Tylenol PO (pre-op)*, Celebrex PO (pre-op)* and Gabapentin PO (pre-op)*   Induction: Intravenous  PONV Risk Score and Plan: 2 and Ondansetron, Dexamethasone, Midazolam and Treatment may vary due to age or medical condition  Airway Management Planned: Oral ETT  Additional Equipment:   Intra-op Plan:   Post-operative Plan: Extubation in OR  Informed Consent:      Dental Advisory Given  Plan Discussed with: Anesthesiologist, CRNA and Surgeon  Anesthesia Plan Comments:        Anesthesia Quick Evaluation

## 2022-03-08 ENCOUNTER — Encounter: Payer: Self-pay | Admitting: Orthopedic Surgery

## 2022-03-08 ENCOUNTER — Ambulatory Visit: Payer: BC Managed Care – PPO | Admitting: Anesthesiology

## 2022-03-08 ENCOUNTER — Encounter
Admission: RE | Disposition: A | Discharge status: Home / Self Care | Payer: Self-pay | Source: Ambulatory Visit | Attending: Orthopedic Surgery

## 2022-03-08 ENCOUNTER — Other Ambulatory Visit: Payer: Self-pay | Admitting: Orthopedic Surgery

## 2022-03-08 ENCOUNTER — Ambulatory Visit
Admission: RE | Admit: 2022-03-08 | Discharge: 2022-03-08 | Disposition: A | Discharge status: Home / Self Care | Payer: BC Managed Care – PPO | Source: Ambulatory Visit | Attending: Orthopedic Surgery | Admitting: Orthopedic Surgery

## 2022-03-08 ENCOUNTER — Other Ambulatory Visit: Payer: Self-pay

## 2022-03-08 DIAGNOSIS — T8130XA Disruption of wound, unspecified, initial encounter: Secondary | ICD-10-CM | POA: Insufficient documentation

## 2022-03-08 DIAGNOSIS — X58XXXA Exposure to other specified factors, initial encounter: Secondary | ICD-10-CM | POA: Diagnosis not present

## 2022-03-08 DIAGNOSIS — Z96651 Presence of right artificial knee joint: Secondary | ICD-10-CM

## 2022-03-08 HISTORY — PX: IRRIGATION AND DEBRIDEMENT KNEE: SHX5185

## 2022-03-08 LAB — GLUCOSE, CAPILLARY
Glucose-Capillary: 146 mg/dL — ABNORMAL HIGH (ref 70–99)
Glucose-Capillary: 153 mg/dL — ABNORMAL HIGH (ref 70–99)

## 2022-03-08 SURGERY — IRRIGATION AND DEBRIDEMENT KNEE
Anesthesia: General | Site: Knee | Laterality: Right

## 2022-03-08 MED ORDER — CELECOXIB 200 MG PO CAPS
ORAL_CAPSULE | ORAL | Status: AC
  Administered 2022-03-08: 200 mg via ORAL
  Filled 2022-03-08: qty 1

## 2022-03-08 MED ORDER — PROMETHAZINE HCL 25 MG/ML IJ SOLN: 6.2500 mg | INTRAMUSCULAR | Status: DC | PRN

## 2022-03-08 MED ORDER — CEFAZOLIN SODIUM-DEXTROSE 2-4 GM/100ML-% IV SOLN: 2.0000 g | INTRAVENOUS | Status: DC

## 2022-03-08 MED ORDER — CEFAZOLIN SODIUM-DEXTROSE 2-4 GM/100ML-% IV SOLN
INTRAVENOUS | Status: AC
  Filled 2022-03-08: qty 100

## 2022-03-08 MED ORDER — MIDAZOLAM HCL 2 MG/2ML IJ SOLN
INTRAMUSCULAR | Status: AC
  Filled 2022-03-08: qty 2

## 2022-03-08 MED ORDER — ORAL CARE MOUTH RINSE: 15.0000 mL | Freq: Once | OROMUCOSAL | Status: AC

## 2022-03-08 MED ORDER — OXYCODONE HCL 5 MG PO TABS
5.0000 mg | ORAL_TABLET | Freq: Once | ORAL | Status: AC | PRN
  Administered 2022-03-08: 5 mg via ORAL

## 2022-03-08 MED ORDER — OXYCODONE HCL 5 MG PO TABS
5.0000 mg | ORAL_TABLET | Freq: Once | ORAL | Status: AC
  Administered 2022-03-08: 5 mg via ORAL

## 2022-03-08 MED ORDER — MIDAZOLAM HCL 2 MG/2ML IJ SOLN
INTRAMUSCULAR | Status: DC | PRN
  Administered 2022-03-08: 2 mg via INTRAVENOUS

## 2022-03-08 MED ORDER — FENTANYL CITRATE (PF) 100 MCG/2ML IJ SOLN
25.0000 ug | INTRAMUSCULAR | Status: DC | PRN
  Administered 2022-03-08: 25 ug via INTRAVENOUS
  Administered 2022-03-08 (×3): 50 ug via INTRAVENOUS
  Administered 2022-03-08: 25 ug via INTRAVENOUS

## 2022-03-08 MED ORDER — SODIUM CHLORIDE 0.9 % IV SOLN: INTRAVENOUS | Status: DC

## 2022-03-08 MED ORDER — ACETAMINOPHEN 500 MG PO TABS: 1000.0000 mg | ORAL_TABLET | Freq: Once | ORAL | Status: AC

## 2022-03-08 MED ORDER — DROPERIDOL 2.5 MG/ML IJ SOLN: 0.6250 mg | Freq: Once | INTRAMUSCULAR | Status: DC | PRN

## 2022-03-08 MED ORDER — OXYCODONE HCL 5 MG PO TABS
ORAL_TABLET | ORAL | Status: AC
  Filled 2022-03-08: qty 1

## 2022-03-08 MED ORDER — CHLORHEXIDINE GLUCONATE 0.12 % MT SOLN
OROMUCOSAL | Status: AC
  Administered 2022-03-08: 15 mL via OROMUCOSAL
  Filled 2022-03-08: qty 15

## 2022-03-08 MED ORDER — GLYCOPYRROLATE 0.2 MG/ML IJ SOLN
INTRAMUSCULAR | Status: DC | PRN
  Administered 2022-03-08: .2 mg via INTRAVENOUS

## 2022-03-08 MED ORDER — SODIUM CHLORIDE 0.9 % IR SOLN
Status: DC | PRN
  Administered 2022-03-08: 3012 mL

## 2022-03-08 MED ORDER — KETOROLAC TROMETHAMINE 30 MG/ML IJ SOLN
INTRAMUSCULAR | Status: AC
  Filled 2022-03-08: qty 1

## 2022-03-08 MED ORDER — FENTANYL CITRATE (PF) 100 MCG/2ML IJ SOLN
INTRAMUSCULAR | Status: AC
  Filled 2022-03-08: qty 2

## 2022-03-08 MED ORDER — CEFAZOLIN SODIUM-DEXTROSE 2-3 GM-%(50ML) IV SOLR
INTRAVENOUS | Status: DC | PRN
  Administered 2022-03-08: 2 g via INTRAVENOUS

## 2022-03-08 MED ORDER — ACETAMINOPHEN 500 MG PO TABS
ORAL_TABLET | ORAL | Status: AC
  Administered 2022-03-08: 1000 mg via ORAL
  Filled 2022-03-08: qty 2

## 2022-03-08 MED ORDER — SODIUM CHLORIDE 0.9 % IV SOLN: INTRAVENOUS | Status: DC | PRN

## 2022-03-08 MED ORDER — NEOMYCIN-POLYMYXIN B GU 40-200000 IR SOLN
Status: AC
  Filled 2022-03-08: qty 20

## 2022-03-08 MED ORDER — ONDANSETRON HCL 4 MG/2ML IJ SOLN
INTRAMUSCULAR | Status: DC | PRN
  Administered 2022-03-08: 4 mg via INTRAVENOUS

## 2022-03-08 MED ORDER — CHLORHEXIDINE GLUCONATE 0.12 % MT SOLN: 15.0000 mL | Freq: Once | OROMUCOSAL | Status: AC

## 2022-03-08 MED ORDER — PROPOFOL 500 MG/50ML IV EMUL
INTRAVENOUS | Status: DC | PRN
  Administered 2022-03-08: 20 mg via INTRAVENOUS
  Administered 2022-03-08: 50 mg via INTRAVENOUS
  Administered 2022-03-08: 150 ug/kg/min via INTRAVENOUS
  Administered 2022-03-08: 20 mg via INTRAVENOUS

## 2022-03-08 MED ORDER — GABAPENTIN 300 MG PO CAPS: 300.0000 mg | ORAL_CAPSULE | Freq: Once | ORAL | Status: AC

## 2022-03-08 MED ORDER — GABAPENTIN 300 MG PO CAPS
ORAL_CAPSULE | ORAL | Status: AC
  Administered 2022-03-08: 300 mg via ORAL
  Filled 2022-03-08: qty 1

## 2022-03-08 MED ORDER — CELECOXIB 200 MG PO CAPS: 200.0000 mg | ORAL_CAPSULE | Freq: Once | ORAL | Status: AC

## 2022-03-08 MED ORDER — KETOROLAC TROMETHAMINE 30 MG/ML IJ SOLN
30.0000 mg | Freq: Once | INTRAMUSCULAR | Status: AC
  Administered 2022-03-08: 30 mg via INTRAVENOUS

## 2022-03-08 MED ORDER — ACETAMINOPHEN 10 MG/ML IV SOLN: 1000.0000 mg | Freq: Once | INTRAVENOUS | Status: DC | PRN

## 2022-03-08 MED ORDER — FENTANYL CITRATE (PF) 100 MCG/2ML IJ SOLN
INTRAMUSCULAR | Status: DC | PRN
  Administered 2022-03-08 (×2): 50 ug via INTRAVENOUS

## 2022-03-08 MED ORDER — LACTATED RINGERS IV SOLN: INTRAVENOUS | Status: DC

## 2022-03-08 MED ORDER — OXYCODONE HCL 5 MG/5ML PO SOLN: 5.0000 mg | Freq: Once | ORAL | Status: AC | PRN

## 2022-03-08 MED ORDER — DEXMEDETOMIDINE HCL IN NACL 80 MCG/20ML IV SOLN
INTRAVENOUS | Status: DC | PRN
  Administered 2022-03-08: 12 ug via BUCCAL

## 2022-03-08 SURGICAL SUPPLY — 33 items
APL PRP STRL LF DISP 70% ISPRP (MISCELLANEOUS) ×1
BNDG CMPR 5X4 CHSV STRCH STRL (GAUZE/BANDAGES/DRESSINGS) ×1
BNDG COHESIVE 4X5 TAN STRL LF (GAUZE/BANDAGES/DRESSINGS) ×1 IMPLANT
BNDG ELASTIC 4X5.8 VLCR STR LF (GAUZE/BANDAGES/DRESSINGS) IMPLANT
BNDG ELASTIC 6X5.8 VLCR STR LF (GAUZE/BANDAGES/DRESSINGS) IMPLANT
BNDG GAUZE DERMACEA FLUFF 4 (GAUZE/BANDAGES/DRESSINGS) ×1 IMPLANT
BNDG GZE DERMACEA 4 6PLY (GAUZE/BANDAGES/DRESSINGS)
BRUSH SCRUB EZ  4% CHG (MISCELLANEOUS) ×1
BRUSH SCRUB EZ 4% CHG (MISCELLANEOUS) ×2 IMPLANT
CHLORAPREP W/TINT 26 (MISCELLANEOUS) ×2 IMPLANT
DRAPE 3/4 80X56 (DRAPES) IMPLANT
DRAPE INCISE IOBAN 66X60 STRL (DRAPES) ×1 IMPLANT
DRAPE SURG 17X11 SM STRL (DRAPES) IMPLANT
DRAPE U-SHAPE 47X51 STRL (DRAPES) IMPLANT
DRSG AQUACEL AG ADV 3.5X 4 (GAUZE/BANDAGES/DRESSINGS) IMPLANT
ELECT REM PT RETURN 9FT ADLT (ELECTROSURGICAL)
ELECTRODE REM PT RTRN 9FT ADLT (ELECTROSURGICAL) ×1 IMPLANT
GAUZE XEROFORM 1X8 LF (GAUZE/BANDAGES/DRESSINGS) IMPLANT
GLOVE BIOGEL PI IND STRL 8.5 (GLOVE) ×1 IMPLANT
GLOVE SURG ORTHO 8.5 STRL (GLOVE) ×1 IMPLANT
GOWN STRL REUS W/ TWL LRG LVL3 (GOWN DISPOSABLE) ×1 IMPLANT
GOWN STRL REUS W/ TWL XL LVL3 (GOWN DISPOSABLE) ×1 IMPLANT
GOWN STRL REUS W/TWL LRG LVL3 (GOWN DISPOSABLE) ×1
GOWN STRL REUS W/TWL XL LVL3 (GOWN DISPOSABLE) ×1
KIT TURNOVER KIT A (KITS) ×1 IMPLANT
MANIFOLD NEPTUNE II (INSTRUMENTS) ×1 IMPLANT
NS IRRIG 1000ML POUR BTL (IV SOLUTION) ×1 IMPLANT
PACK EXTREMITY ARMC (MISCELLANEOUS) ×1 IMPLANT
STOCKINETTE IMPERVIOUS 9X36 MD (GAUZE/BANDAGES/DRESSINGS) ×1 IMPLANT
SUT ETHILON 2 0 FSLX (SUTURE) IMPLANT
TOWEL OR 17X26 4PK STRL BLUE (TOWEL DISPOSABLE) ×1 IMPLANT
TRAP FLUID SMOKE EVACUATOR (MISCELLANEOUS) ×1 IMPLANT
WATER STERILE IRR 500ML POUR (IV SOLUTION) ×1 IMPLANT

## 2022-03-08 NOTE — Op Note (Signed)
03/08/2022  12:55 PM  PATIENT:  Adam Petty.    PRE-OPERATIVE DIAGNOSIS:  Right knee incisional dehiscence  POST-OPERATIVE DIAGNOSIS:  Same  PROCEDURE:  IRRIGATION AND DEBRIDEMENT RIGHT KNEE with secondary closure  SURGEON:  Lovell Sheehan, MD  ASSIST: none  ANESTHESIA:   General  EBL: less than 25 mL   TOURNIQUET TIME: none used  PREOPERATIVE INDICATIONS:  Adam Mafi. is a  62 y.o. male who failed conservative measures and elected for surgical management of the wound breakdown.   The risks benefits and alternatives were discussed with the patient preoperatively including but not limited to the risks of infection, bleeding, nerve injury, incomplete relief of symptoms, pillar pain, cardiopulmonary complications, the need for revision surgery, among others, and the patient was willing to proceed.  OPERATIVE FINDINGS: small vicryl stitch proximal to wound breakdown  OPERATIVE PROCEDURE:   After informed consent was obtained and the appropriate extremity marked in the pre-operative holding area, the patient was taken to the operating room and placed in the supine position. General anesthesia was induced and the lower extremity was prepped and draped in standard sterile fashion. T  The wound was opened proximally and distally by 1 cm. 3L of GU laced NS was irrigated throughout the wound. Old vicryl was debrided sharply with scissor.   The skin was closed with 2-0 Ethilon. A sterile dressing was applied.   Adam Bushman, MD

## 2022-03-08 NOTE — H&P (Signed)
The patient has been re-examined, and the chart reviewed, and there have been no interval changes to the documented history and physical.  Plan a right incision irrigation, debridement and closure today.  Anesthesia is not consulted regarding a peripheral nerve block for post-operative pain.  The risks, benefits, and alternatives have been discussed at length, and the patient is willing to proceed.

## 2022-03-08 NOTE — Anesthesia Postprocedure Evaluation (Signed)
Anesthesia Post Note  Patient: Adam Petty.  Procedure(s) Performed: IRRIGATION AND DEBRIDEMENT RIGHT KNEE (Right: Knee)  Patient location during evaluation: PACU Anesthesia Type: General Level of consciousness: awake and alert Pain management: pain level controlled Vital Signs Assessment: post-procedure vital signs reviewed and stable Respiratory status: spontaneous breathing, nonlabored ventilation and respiratory function stable Cardiovascular status: blood pressure returned to baseline and stable Postop Assessment: no apparent nausea or vomiting Anesthetic complications: no   No notable events documented.   Last Vitals:  Vitals:   03/08/22 1345 03/08/22 1353  BP: 127/82 130/79  Pulse: 65 69  Resp: 12 17  Temp:  (!) 36.2 C  SpO2: 96% 100%    Last Pain:  Vitals:   03/08/22 1353  TempSrc: Temporal  PainSc: 0-No pain                 [place] Ouch

## 2022-03-08 NOTE — Transfer of Care (Signed)
Immediate Anesthesia Transfer of Care Note  Patient: Adam Petty.  Procedure(s) Performed: IRRIGATION AND DEBRIDEMENT RIGHT KNEE (Right: Knee)  Patient Location: PACU  Anesthesia Type:MAC  Level of Consciousness: drowsy  Airway & Oxygen Therapy: Patient Spontanous Breathing and Patient connected to face mask oxygen  Post-op Assessment: Report given to RN and Post -op Vital signs reviewed and stable  Post vital signs: Reviewed  Last Vitals:  Vitals Value Taken Time  BP 132/79 03/08/22 1254  Temp 15F   Pulse 73 03/08/22 1257  Resp 14 03/08/22 1257  SpO2 100 % 03/08/22 1257  Vitals shown include unvalidated device data.  Last Pain:  Vitals:   03/08/22 1107  TempSrc: Temporal  PainSc: 5          Complications: No notable events documented.

## 2022-03-08 NOTE — Discharge Instructions (Addendum)
Removed ACE wrap on Thursday. Keep Aquacel in place. OK to shower with Aquacel.  Follow-up in 1 week Carlynn Spry, PA-C  AMBULATORY SURGERY  DISCHARGE INSTRUCTIONS   The drugs that you were given will stay in your system until tomorrow so for the next 24 hours you should not:  Drive an automobile Make any legal decisions Drink any alcoholic beverage   You may resume regular meals tomorrow.  Today it is better to start with liquids and gradually work up to solid foods.  You may eat anything you prefer, but it is better to start with liquids, then soup and crackers, and gradually work up to solid foods.   Please notify your doctor immediately if you have any unusual bleeding, trouble breathing, redness and pain at the surgery site, drainage, fever, or pain not relieved by medication.    Additional Instructions:        Please contact your physician with any problems or Same Day Surgery at [phone], Monday through Friday 6 am to 4 pm, or [place] at [facility] number at [phone].

## 2022-03-08 NOTE — H&P (Signed)
Adam Petty. MRN:  [id number] DOB/SEX:  August 21, 1960/male  CHIEF COMPLAINT:  Painful right Knee  HISTORY: Patient is a 62 y.o. male presented with a history of pain in the right knee. Onset of symptoms was abrupt starting several weeks ago with gradually worsening course since that time. Prior procedures on the knee include arthroplasty. Patient has been treated conservatively with over-the-counter NSAIDs and activity modification. Patient currently rates pain in the knee at 10 out of 10 with activity. There is pain at night.  PAST MEDICAL HISTORY: Patient Active Problem List   Diagnosis Date Noted   S/P TKR (total knee replacement) using cement, right 12/28/2021   Osteoarthritis of elbow (Right) 12/21/2021   Chronic elbow pain (Right) 11/16/2021   Chronic forearm joint pain (Right) 11/16/2021   Lateral epicondylitis of elbow (Right) 11/02/2021   Tennis elbow (Right) 11/02/2021   Elbow pain (Right) 05/31/2021   Greater trochanteric bursitis of right hip 05/31/2021   DDD (degenerative disc disease), cervical 04/20/2021   Cervical facet syndrome 04/20/2021   Cervicalgia 04/20/2021   Cervicogenic headache 04/20/2021   Inflammatory spondylopathy of lumbosacral region ([place]) 08/05/2020   Chronic use of opiate for therapeutic purpose 05/11/2020   Long term current use of non-steroidal anti-inflammatories (NSAID) 07/01/2019   Pharmacologic therapy 06/24/2019   Lumbosacral radiculopathy (S1) (Right) 04/17/2019   Spondylosis without myelopathy or radiculopathy, lumbosacral region 04/02/2019   DDD (degenerative disc disease), lumbosacral 04/02/2019   DDD (degenerative disc disease), thoracic 04/02/2019   Hepatitis C antibody test positive 11/20/2018   Hypertension associated with diabetes ([place]) 04/18/2018   B12 deficiency 09/08/2017   Chronic sacroiliac joint pain (Bilateral) (R>L) 08/25/2016   Osteoarthritis of glenohumeral joint (Right) 06/16/2016   Acute low back pain without  sciatica 05/05/2016   Chronic pain syndrome (WC injury) 02/09/2016   Acute medial meniscal tear 03/25/2015   Chronic knee pain (2ry area of Pain) (WC injury) (Right) 03/12/2015   Osteoarthritis of knee (Right) 03/12/2015   Chronic shoulder pain (3ry area of Pain) (Right) 03/12/2015   Osteoarthritis of shoulder (Right) 03/12/2015   Chronic lower extremity pain (Right) 03/12/2015   Recurrent nephrolithiasis 03/12/2015   Lumbar spondylosis 02/24/2015   Long term prescription opiate use 12/22/2014   Encounter for chronic pain management 12/22/2014   Neurogenic pain 12/22/2014   Neuropathic pain 12/22/2014   Musculoskeletal pain 12/22/2014   Chronic low back pain (WC injury) (1ry area of Pain) (Bilateral) (R>L) w/o sciatica 10/22/2014   Long term current use of opiate analgesic 10/22/2014   Encounter for therapeutic drug level monitoring 10/22/2014   Opiate use (30 MME/Day) 10/22/2014   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   Discogenic syndrome, lumbar (WC injury) 10/22/2014   Osteoarthritis of spine with radiculopathy, lumbar region (WC injury) 10/22/2014   Lumbar facet syndrome (WC injury) (Bilateral) (R>L) 10/22/2014   Lumbosacral radiculopathy (WC injury) [id number]   Uncomplicated opioid dependence ([place]) 10/22/2014   Therapeutic opioid-induced constipation (OIC) 10/22/2014   Lateral meniscal tear 10/01/2013   Microalbuminuria 09/12/2013   Type 2 diabetes mellitus with hyperglycemia, with long-term current use of insulin ([place]) 09/12/2013   Obesity, Class III, BMI 40-49.9 (morbid obesity) ([place]) 09/10/2013   Dyslipidemia 09/10/2013   Adiposity 09/10/2013   Hyperlipidemia due to type 2 diabetes mellitus ([place]) 09/10/2013   Spinal stenosis of lumbar region 02/06/2013   Lumbar canal stenosis 02/06/2013   Past Medical History:  Diagnosis Date   Arthritis, senescent 10/22/2014   Back pain    history  spinal stenosis, DDD   Chronic pain syndrome 10/22/2014   DJD  (degenerative joint disease)    Dyslipidemia    Essential hypertension    History of kidney stones    Hypercholesterolemia    IBS (irritable bowel syndrome)    Lateral meniscus tear    rt knee   Osteoarthritis    Preventative health care    takes crestor/ altace for "preventative reasons" denies heart or BP problems   Sleep apnea    had UPPP surgery and now sleep apnea resolved   Spinal stenosis    Type 2 diabetes mellitus without complication [facility])    Past Surgical History:  Procedure Laterality Date   CERVICAL FUSION  1995   COLONOSCOPY  01/24/2003   COLONOSCOPY  01/16/2018   CTR Left 02/27/2018   CTR and trigger finger repair   CYSTOSCOPY W/ RETROGRADES Right 11/19/2015   Procedure: URETEROSCOPY WITH RETROGRADE PYELOGRAM;  Surgeon: Royston Cowper, MD;  Location: [facility];  Service: Urology;  Laterality: Right;   CYSTOSCOPY W/ RETROGRADES Right 10/03/2017   Procedure: CYSTOSCOPY WITH RETROGRADE PYELOGRAM;  Surgeon: Royston Cowper, MD;  Location: [facility];  Service: Urology;  Laterality: Right;   FOOT SURGERY     RT   KNEE ARTHROSCOPY Right 10/02/2013   Procedure: RIGHT KNEE ARTHROSCOPY, PARTIAL LATERAL MENISCECTOMY, DEBRIDEMENT, MEDIAL AND LATERAL CHONDROPLASTY;  Surgeon: Gearlean Alf, MD;  Location: [facility];  Service: Orthopedics;  Laterality: Right;   KNEE ARTHROSCOPY WITH MEDIAL MENISECTOMY Right 03/25/2015   Procedure: RIGHT KNEE ARTHROSCOPY WITH MENISCAL DEBRIDEMENT and chrodroplasty;  Surgeon: Gaynelle Arabian, MD;  Location: [facility];  Service: Orthopedics;  Laterality: Right;   KNEE BURSECTOMY Right 03/25/2015   Procedure: RIGHT KNEE PREPATELLA BURSECTOMY;  Surgeon: Gaynelle Arabian, MD;  Location: [facility];  Service: Orthopedics;  Laterality: Right;   LUMBAR LAMINECTOMY/DECOMPRESSION MICRODISCECTOMY N/A 02/06/2013   Procedure: MICRO LUMBAR DECOMPRESSION L3-L4 and L4 - L5 ;  Surgeon: Johnn Hai, MD;  Location: [facility];  Service: Orthopedics;  Laterality: N/A;   PALATE  SURGERY     surg for sleep apnea and sinus issues   ROTATOR CUFF REPAIR Right    x3   ROTATOR CUFF REPAIR Left    SHOULDER SURGERY Right    spur removal x2   TONSILLECTOMY     TOTAL KNEE ARTHROPLASTY Right 12/28/2021   Procedure: TOTAL KNEE ARTHROPLASTY;  Surgeon: Lovell Sheehan, MD;  Location: [facility];  Service: Orthopedics;  Laterality: Right;   URETEROSCOPY Right 10/03/2017   Procedure: URETEROSCOPY;  Surgeon: Royston Cowper, MD;  Location: [facility];  Service: Urology;  Laterality: Right;     MEDICATIONS:  (Not in a hospital admission)   ALLERGIES:  No Known Allergies  REVIEW OF SYSTEMS:  Pertinent items are noted in HPI.   FAMILY HISTORY:   Family History  Problem Relation Age of Onset   Cancer Mother    Heart disease Mother    COPD Mother    Diabetes Father    Heart disease Father     SOCIAL HISTORY:   Social History   Tobacco Use   Smoking status: Never   Smokeless tobacco: Former    Types: Chew  Substance Use Topics   Alcohol use: Yes    Comment: occasional     EXAMINATION:  Vital signs in last 24 hours: @VSRANGES$ @  General appearance: alert, cooperative, and no distress Neck: no JVD and supple, symmetrical, trachea midline Lungs: clear to auscultation bilaterally Heart: regular rate and  rhythm, S1, S2 normal, no murmur, click, rub or gallop Abdomen: soft, non-tender; bowel sounds normal; no masses,  no organomegaly Extremities: extremities normal, atraumatic, no cyanosis or edema and Homans sign is negative, no sign of DVT Pulses: 2+ and symmetric Skin: Skin color, texture, turgor normal. No rashes or lesions  Musculoskeletal:  ROM 0-110, Ligaments intact,    RIGht knee incision poor healing   Right knee I&D  Assessment/Plan: Carlynn Spry 03/08/2022, 10:32 AM

## 2022-03-09 ENCOUNTER — Encounter: Payer: Self-pay | Admitting: Orthopedic Surgery

## 2022-04-20 ENCOUNTER — Encounter: Payer: BC Managed Care – PPO | Admitting: Pain Medicine

## 2022-04-24 NOTE — Progress Notes (Unsigned)
PROVIDER NOTE: Information contained herein reflects review and annotations entered in association with encounter. Interpretation of such information and data should be left to medically-trained personnel. Information provided to patient can be located elsewhere in the medical record under "Patient Instructions". Document created using STT-dictation technology, any transcriptional errors that may result from process are unintentional.    Patient: Adam Petty.  Service Category: E/M  Provider: Oswaldo Done, MD  DOB: 1960-11-07  DOS: 04/25/2022  Referring Provider: No ref. provider found  MRN: [id number]  Specialty: Interventional Pain Management  PCP: System, Provider Not In  Type: Established Patient  Setting: Ambulatory outpatient    Location: Office  Delivery: Face-to-face     HPI  Mr. Algenis Ballin., a 62 y.o. year old male, is here today because of his No primary diagnosis found.. Mr. Koziol primary complain today is No chief complaint on file.  Pertinent problems: Mr. 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); Discogenic syndrome, lumbar (WC injury); Osteoarthritis of spine with radiculopathy, lumbar region (WC injury); Lumbar facet syndrome (WC injury) (Bilateral) (R>L); Lumbosacral radiculopathy (WC injury); Lumbar canal stenosis; Neurogenic pain; Neuropathic pain; Musculoskeletal pain; Lumbar spondylosis; Chronic knee pain (2ry area of Pain) (WC injury) (Right); Osteoarthritis of knee (Right); Chronic shoulder pain (3ry area of Pain) (Right); Osteoarthritis of shoulder (Right); Chronic lower extremity pain (Right); Recurrent nephrolithiasis; Acute medial meniscal tear; Chronic pain syndrome (WC injury); Acute low back pain without sciatica; Osteoarthritis of glenohumeral joint (Right); Chronic sacroiliac joint pain  (Bilateral) (R>L); Spondylosis without myelopathy or radiculopathy, lumbosacral region; DDD (degenerative disc disease), lumbosacral; DDD (degenerative disc disease), thoracic; Lumbosacral radiculopathy (S1) (Right); Inflammatory spondylopathy of lumbosacral region; DDD (degenerative disc disease), cervical; Cervical facet syndrome; Cervicalgia; Cervicogenic headache; Elbow pain (Right); Greater trochanteric bursitis of right hip; Lateral epicondylitis of elbow (Right); Tennis elbow (Right); Chronic elbow pain (Right); Chronic forearm joint pain (Right); and Osteoarthritis of elbow (Right) on their pertinent problem list. Pain Assessment: Severity of   is reported as a  /10. Location:    / . Onset:  . Quality:  . Timing:  . Modifying factor(s):  Marland Kitchen Vitals:  vitals were not taken for this visit.  BMI: Estimated body mass index is 36.32 kg/m as calculated from the following:   Height as of 03/08/22: 5\' 6"  (1.676 m).   Weight as of 03/08/22: 225 lb (102.1 kg). Last encounter: 01/26/2022. Last procedure: 11/02/2021.  Reason for encounter:  *** . ***  Pharmacotherapy Assessment  Analgesic: Oxycodone IR 5 mg, 1 tab PO q 6 hrs (20 mg/day)  MME/day: 30 mg/day.   Monitoring: [place] PMP: PDMP reviewed during this encounter.       Pharmacotherapy: No side-effects or adverse reactions reported. Compliance: No problems identified. Effectiveness: Clinically acceptable.  No notes on file  No results found for: "CBDTHCR" No results found for: "D8THCCBX" No results found for: "D9THCCBX"  UDS:  Summary  Date Value Ref Range Status  07/26/2021 Note  Final    Comment:    ==================================================================== [facility] (MW) ==================================================================== Test                             Result       Flag       Units  Drug Present and Declared for Prescription Verification  Oxycodone                      2655         EXPECTED    ng/mg creat   Oxymorphone                    1235         EXPECTED   ng/mg creat   Noroxycodone                   2413         EXPECTED   ng/mg creat   Noroxymorphone                 438          EXPECTED   ng/mg creat    Sources of oxycodone are scheduled prescription medications.    Oxymorphone, noroxycodone, and noroxymorphone are expected    metabolites of oxycodone. Oxymorphone is also available as a    scheduled prescription medication.  ==================================================================== Test                      Result    Flag   Units      Ref Range   Creatinine              280              mg/dL      >=20 ==================================================================== Declared Medications:  The flagging and interpretation on this report are based on the  following declared medications.  Unexpected results may arise from  inaccuracies in the declared medications.   **Note: The testing scope of this panel includes these medications:   Oxycodone (Percocet)   **Note: The testing scope of this panel does not include the  following reported medications:   Acetaminophen (Percocet)  Bisacodyl  Cholecalciferol  Docusate (Colace)  Gabapentin (Neurontin)  Meloxicam (Mobic)  Metformin (Glucophage)  Naproxen (Aleve)  Ramipril (Altace)  Rosuvastatin (Crestor)  Supplement (Fiber)  Vitamin B12 ==================================================================== For clinical consultation, please call [phone]. ====================================================================       ROS  Constitutional: Denies any fever or chills Gastrointestinal: No reported hemesis, hematochezia, vomiting, or acute GI distress Musculoskeletal: Denies any acute onset joint swelling, redness, loss of ROM, or weakness Neurological: No reported episodes of acute onset apraxia, aphasia, dysarthria, agnosia, amnesia, paralysis, loss of coordination, or loss of  consciousness  Medication Review  Cholecalciferol, Oxycodone HCl, Semaglutide(0.25 or 0.5MG /DOS), aspirin, celecoxib, cyanocobalamin, docusate sodium, gabapentin, ibuprofen, metFORMIN, methocarbamol, naloxone, naproxen sodium, oxyCODONE, oxyCODONE-acetaminophen, ramipril, and rosuvastatin  History Review  Allergy: Mr. Guerrieri has No Known Allergies. Drug: Mr. Sahai  reports no history of drug use. Alcohol:  reports current alcohol use. Tobacco:  reports that he has never smoked. He has quit using smokeless tobacco.  His smokeless tobacco use included chew. Social: Mr. Pendelton  reports that he has never smoked. He has quit using smokeless tobacco.  His smokeless tobacco use included chew. He reports current alcohol use. He reports that he does not use drugs. Medical:  has a past medical history of Arthritis, senescent (10/22/2014), Back pain, Chronic pain syndrome (10/22/2014), DJD (degenerative joint disease), Dyslipidemia, Essential hypertension, History of kidney stones, Hypercholesterolemia, IBS (irritable bowel syndrome), Lateral meniscus tear, Osteoarthritis, Preventative health care, Sleep apnea, Spinal stenosis, and Type 2 diabetes mellitus without complication (HCC). Surgical: Mr. Norder  has a past surgical history that includes Tonsillectomy; Shoulder surgery (Right); Palate  surgery; Cervical fusion (1995); Foot surgery; Lumbar laminectomy/decompression microdiscectomy (N/A, 02/06/2013); Knee arthroscopy (Right, 10/02/2013); Knee arthroscopy with medial menisectomy (Right, 03/25/2015); Knee bursectomy (Right, 03/25/2015); Cystoscopy w/ retrogrades (Right, 11/19/2015); Cystoscopy w/ retrogrades (Right, 10/03/2017); Ureteroscopy (Right, 10/03/2017); CTR (Left, 02/27/2018); Colonoscopy (01/24/2003); Colonoscopy (01/16/2018); Rotator cuff repair (Right); Rotator cuff repair (Left); Total knee arthroplasty (Right, 12/28/2021); and Irrigation and debridement knee (Right, 03/08/2022). Family:  family history includes COPD in his mother; Cancer in his mother; Diabetes in his father; Heart disease in his father and mother.  Laboratory Chemistry Profile   Renal Lab Results  Component Value Date   BUN 15 12/28/2021   CREATININE 0.90 12/28/2021   GFRAA >60 07/01/2019   GFRNONAA >60 12/16/2021    Hepatic Lab Results  Component Value Date   AST 21 12/27/2020   ALT 24 12/27/2020   ALBUMIN 3.8 12/27/2020   ALKPHOS 82 12/27/2020    Electrolytes Lab Results  Component Value Date   NA 139 12/28/2021   K 4.2 12/28/2021   CL 105 12/28/2021   CALCIUM 9.7 12/16/2021   MG 1.8 03/12/2015    Bone No results found for: "VD25OH", "VD125OH2TOT", "ZO1096EA5", "WU9811BJ4", "25OHVITD1", "25OHVITD2", "25OHVITD3", "TESTOFREE", "TESTOSTERONE"  Inflammation (CRP: Acute Phase) (ESR: Chronic Phase) Lab Results  Component Value Date   CRP 0.6 03/12/2015   ESRSEDRATE 9 03/12/2015         Note: Above Lab results reviewed.  Recent Imaging Review  MR FOREARM RIGHT WO CONTRAST CLINICAL DATA:  Right forearm and elbow pain for 6 months. Progressive.  EXAM: MRI OF THE RIGHT FOREARM WITHOUT CONTRAST  TECHNIQUE: Multiplanar, multisequence MR imaging of the right forearm was performed. No intravenous contrast was administered.  COMPARISON:  right elbow and forearm radiographs 11/30/2023w  FINDINGS: Bones/Joint/Cartilage  As described on contemporaneous MRI elbow report, there is moderate osteoarthritis of the trochlea-coronoid and radiocapitellar elbow joints.  There is mild marrow edema within the proximal radial diaphysis, centered between the radial neck in the radial tuberosity. The biceps brachii tendon insertion on the radial tuberosity is intact. No acute fracture is seen. This is nonspecific and may be due to stress related changes. No abnormal marrow edema is seen within the ulna.  Small elbow joint effusion.  Ligaments  High-grade common extensor tendon origin and  low-grade common flexor tendon origin tears at the distal humerus, as described on contemporaneous elbow MRI.  Muscles and Tendons  Normal size of the right forearm musculature. Mild edema within the flexor carpi ulnaris muscle (axial series 15 images 4 through 33). Note is made this is innervated by the ulnar nerve, and there was mild edema and swelling within the ulnar nerve within the cubital tunnel better seen on contemporaneous MRI of the right elbow.  Soft tissues  The rest of the more distal course of the ulnar nerve appears unremarkable.  IMPRESSION: 1. Mild marrow edema within the proximal radial diaphysis, centered between the radial neck and the radial tuberosity. This is nonspecific and may be due to stress related changes. No acute fracture is seen. 2. Mild edema within the flexor carpi ulnaris muscle. Note is made this muscle is innervated by the ulnar nerve, and there was mild edema and swelling seen within the ulnar nerve within the cubital tunnel on contemporaneous MRI of the right elbow. 3. High-grade common extensor tendon origin and low-grade common flexor tendon origin tears at the distal humerus, as described on contemporaneous elbow MRI. 4. Moderate osteoarthritis of the trochlea-coronoid and radiocapitellar joints.  Electronically Signed  By: Neita Garnetonald  Viola M.D.   On: 01/13/2022 10:53 MR ELBOW RIGHT WO CONTRAST CLINICAL DATA:  Chronic right elbow pain. Medial epicondylitis suspected. Bursitis suspected. 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  EXAM: MRI OF THE RIGHT ELBOW WITHOUT CONTRAST  TECHNIQUE: Multiplanar, multisequence MR imaging of the elbow was performed. No intravenous contrast was administered.  COMPARISON:  Right elbow and right forearm radiographs 12/16/2021  FINDINGS: TENDONS  Common forearm flexor origin: There is horizontal linear fluid bright signal at the midsubstance to volar aspect of the  common flexor tendon origin (axial series 12, image 16 and coronal series 11, image 16), measuring up to only 2 mm in transverse dimension but 9 mm in AP dimension and 8 mm along the craniocaudal dimension. No tendon retraction.  Common forearm extensor origin: There is high-grade partial-thickness tearing of the deep aspect of the common extensor tendon origin (coronal series 11 images 13 through 17 and axial series 12 images 13 through 16), involving greater than 75% of the transverse tendon thickness, greater than 75% of the AP tendon thickness, and with retraction of the torn fibers up to approximately 14 mm in the craniocaudal dimension.  Biceps: The biceps brachii and brachialis tendon insertions are intact.  Triceps: The triceps tendon insertion is intact.  LIGAMENTS  Medial stabilizers: Mild intermediate T2 signal sprain at the lateral epicondyle origin of the intercarpal ligament. The distal insertion of the sublime tubercle of the ulna appears intact.  Lateral stabilizers: There appears to be there is a high-grade partial-thickness tear of the radial collateral ligament, contiguous with the high-grade tear of the adjacent volar aspect of the common extensor tendon origin. The radioulnar collateral ligament appears intact.  Cartilage: High-grade partial-thickness thinning of the trochlea-coronoid process and capitellum-radial head cartilage. There is again moderate degenerative spurring at the tip of the coronoid process. Mild radial head-neck junction degenerative spurring is again seen. Moderate degenerative spurring of the proximal radioulnar joint.  Joint: Small joint effusion.  Cubital tunnel: Minimal edema and swelling of the ulnar nerve as it passes through the cubital tunnel (axial series 12, image 14) at the level of the distal humerus.  Normal course and signal of the radial and median nerves.  Bones: No acute fracture.  IMPRESSION: 1. High-grade  partial-thickness tear of the deep aspect of the common extensor tendon origin. 2. High-grade partial-thickness tear of the radial collateral ligament, contiguous with the high-grade tear of the adjacent volar aspect of the common extensor tendon origin. 3. Low-grade partial-thickness tear of the common flexor tendon origin. 4. Moderate elbow osteoarthritis. 5. Minimal edema and swelling of the ulnar nerve as it passes through the cubital tunnel.  Electronically Signed   By: Neita Garnetonald  Viola M.D.   On: 01/13/2022 10:42 Note: Reviewed        Physical Exam  General appearance: Well nourished, well developed, and well hydrated. In no apparent acute distress Mental status: Alert, oriented x 3 (person, place, & time)       Respiratory: No evidence of acute respiratory distress Eyes: PERLA Vitals: There were no vitals taken for this visit. BMI: Estimated body mass index is 36.32 kg/m as calculated from the following:   Height as of 03/08/22: 5\' 6"  (1.676 m).   Weight as of 03/08/22: 225 lb (102.1 kg). Ideal: Patient weight not recorded  Assessment   Diagnosis Status  No diagnosis found. Controlled Controlled Controlled   Updated Problems: No problems updated.  Plan of Care  Problem-specific:  No problem-specific Assessment & Plan notes found for this encounter.  Mr. Emit Lykins. has a current medication list which includes the following long-term medication(s): gabapentin, oxycodone, oxycodone, oxycodone, and oxycodone hcl.  Pharmacotherapy (Medications Ordered): No orders of the defined types were placed in this encounter.  Orders:  No orders of the defined types were placed in this encounter.  Follow-up plan:   No follow-ups on file.      Interventional Therapies  Risk  Complexity Considerations:   Estimated body mass index is 38.74 kg/m as calculated from the following:   Height as of this encounter: 5\' 6"  (1.676 m).   Weight as of this encounter: 240 lb  (108.9 kg). WNL   Planned  Pending:      Under consideration:   Possible right Suprascapular nerve RFA  Possible left lumbar facet RFA  Diagnostic right knee genicular NB    Completed:   Diagnostic right caudal ESI x1 + epidurogram #1 (10/02/2019)  Palliative right lumbar facet MBB x4 (04/02/2019)  Palliative left lumbar facet MBB x2 (12/16/2015)  Palliative right lumbar facet RFA x1  (Right: 04/13/2016) (100% relief)  Diagnostic/therapeutic bilateral SI joint block x2 (11/17/2016)  Diagnostic right Suprascapular NB x1 (06/22/2016)    Therapeutic  Palliative (PRN) options:   Palliative lumbar facet block  Palliative right lumbar facet RFA #2  Diagnostic Suprascapular nerve Block    Pharmacotherapy  Nonopioids transfer 11/04/2019: Neurontin        Recent Visits Date Type Provider Dept  01/26/22 Office Visit Delano Metz, MD [facility]  Showing recent visits within past 90 days and meeting all other requirements Future Appointments Date Type Provider Dept  04/25/22 Appointment Delano Metz, MD [facility]  Showing future appointments within next 90 days and meeting all other requirements  I discussed the assessment and treatment plan with the patient. The patient was provided an opportunity to ask questions and all were answered. The patient agreed with the plan and demonstrated an understanding of the instructions.  Patient advised to call back or seek an in-person evaluation if the symptoms or condition worsens.  Duration of encounter: *** minutes.  Total time on encounter, as per AMA guidelines included both the face-to-face and non-face-to-face time personally spent by the physician and/or other qualified health care professional(s) on the day of the encounter (includes time in activities that require the physician or other qualified health care professional and does not include time in activities normally performed by clinical staff).  Physician's time may include the following activities when performed: Preparing to see the patient (e.g., pre-charting review of records, searching for previously ordered imaging, lab work, and nerve conduction tests) Review of prior analgesic pharmacotherapies. Reviewing PMP Interpreting ordered tests (e.g., lab work, imaging, nerve conduction tests) Performing post-procedure evaluations, including interpretation of diagnostic procedures Obtaining and/or reviewing separately obtained history Performing a medically appropriate examination and/or evaluation Counseling and educating the patient/family/caregiver Ordering medications, tests, or procedures Referring and communicating with other health care professionals (when not separately reported) Documenting clinical information in the electronic or other health record Independently interpreting results (not separately reported) and communicating results to the patient/ family/caregiver Care coordination (not separately reported)  Note by: Oswaldo Done, MD Date: 04/25/2022; Time: 5:31 PM

## 2022-04-25 ENCOUNTER — Encounter: Payer: Self-pay | Admitting: Pain Medicine

## 2022-04-25 ENCOUNTER — Ambulatory Visit: Payer: BC Managed Care – PPO | Attending: Pain Medicine | Admitting: Pain Medicine

## 2022-04-25 DIAGNOSIS — G894 Chronic pain syndrome: Secondary | ICD-10-CM | POA: Insufficient documentation

## 2022-04-25 DIAGNOSIS — Z79891 Long term (current) use of opiate analgesic: Secondary | ICD-10-CM | POA: Diagnosis present

## 2022-04-25 DIAGNOSIS — M79604 Pain in right leg: Secondary | ICD-10-CM | POA: Diagnosis present

## 2022-04-25 DIAGNOSIS — M5126 Other intervertebral disc displacement, lumbar region: Secondary | ICD-10-CM | POA: Diagnosis present

## 2022-04-25 DIAGNOSIS — M25561 Pain in right knee: Secondary | ICD-10-CM | POA: Insufficient documentation

## 2022-04-25 DIAGNOSIS — M961 Postlaminectomy syndrome, not elsewhere classified: Secondary | ICD-10-CM | POA: Diagnosis present

## 2022-04-25 DIAGNOSIS — Z79899 Other long term (current) drug therapy: Secondary | ICD-10-CM | POA: Insufficient documentation

## 2022-04-25 DIAGNOSIS — M25511 Pain in right shoulder: Secondary | ICD-10-CM | POA: Insufficient documentation

## 2022-04-25 DIAGNOSIS — M545 Low back pain, unspecified: Secondary | ICD-10-CM | POA: Insufficient documentation

## 2022-04-25 DIAGNOSIS — G8929 Other chronic pain: Secondary | ICD-10-CM | POA: Insufficient documentation

## 2022-04-25 MED ORDER — OXYCODONE HCL 5 MG PO TABS: 5.0000 mg | ORAL_TABLET | Freq: Four times a day (QID) | ORAL | 0 refills | Status: DC | PRN

## 2022-04-25 NOTE — Patient Instructions (Signed)
____________________________________________________________________________________________  Opioid Pain Medication Update  To: All patients taking opioid pain medications. (I.e.: hydrocodone, hydromorphone, oxycodone, oxymorphone, morphine, codeine, methadone, tapentadol, tramadol, buprenorphine, fentanyl, etc.)  Re: Updated review of side effects and adverse reactions of opioid analgesics, as well as new information about long term effects of this class of medications.  Direct risks of long-term opioid therapy are not limited to opioid addiction and overdose. Potential medical risks include serious fractures, breathing problems during sleep, hyperalgesia, immunosuppression, chronic constipation, bowel obstruction, myocardial infarction, and tooth decay secondary to xerostomia.  Unpredictable adverse effects that can occur even if you take your medication correctly: Cognitive impairment, respiratory depression, and death. Most people think that if they take their medication "correctly", and "as instructed", that they will be safe. Nothing could be farther from the truth. In reality, a significant amount of recorded deaths associated with the use of opioids has occurred in individuals that had taken the medication for a long time, and were taking their medication correctly. The following are examples of how this can happen: Patient taking his/her medication for a long time, as instructed, without any side effects, is given a certain antibiotic or another unrelated medication, which in turn triggers a "Drug-to-drug interaction" leading to disorientation, cognitive impairment, impaired reflexes, respiratory depression or an untoward event leading to serious bodily harm or injury, including death.  Patient taking his/her medication for a long time, as instructed, without any side effects, develops an acute impairment of liver and/or kidney function. This will lead to a rapid inability of the body to  breakdown and eliminate their pain medication, which will result in effects similar to an "overdose", but with the same medicine and dose that they had always taken. This again may lead to disorientation, cognitive impairment, impaired reflexes, respiratory depression or an untoward event leading to serious bodily harm or injury, including death.  A similar problem will occur with patients as they grow older and their liver and kidney function begins to decrease as part of the aging process.  Background information: Historically, the original case for using long-term opioid therapy to treat chronic noncancer pain was based on safety assumptions that subsequent experience has called into question. In 1996, the American Pain Society and the American Academy of Pain Medicine issued a consensus statement supporting long-term opioid therapy. This statement acknowledged the dangers of opioid prescribing but concluded that the risk for addiction was low; respiratory depression induced by opioids was short-lived, occurred mainly in opioid-naive patients, and was antagonized by pain; tolerance was not a common problem; and efforts to control diversion should not constrain opioid prescribing. This has now proven to be wrong. Experience regarding the risks for opioid addiction, misuse, and overdose in community practice has failed to support these assumptions.  According to the Centers for Disease Control and Prevention, fatal overdoses involving opioid analgesics have increased sharply over the past decade. Currently, more than 96,700 people die from drug overdoses every year. Opioids are a factor in 7 out of every 10 overdose deaths. Deaths from drug overdose have surpassed motor vehicle accidents as the leading cause of death for individuals between the ages of 35 and 54.  Clinical data suggest that neuroendocrine dysfunction may be very common in both men and women, potentially causing hypogonadism, erectile  dysfunction, infertility, decreased libido, osteoporosis, and depression. Recent studies linked higher opioid dose to increased opioid-related mortality. Controlled observational studies reported that long-term opioid therapy may be associated with increased risk for cardiovascular events. Subsequent meta-analysis concluded   that the safety of long-term opioid therapy in elderly patients has not been proven.   Side Effects and adverse reactions: Common side effects: Drowsiness (sedation). Dizziness. Nausea and vomiting. Constipation. Physical dependence -- Dependence often manifests with withdrawal symptoms when opioids are discontinued or decreased. Tolerance -- As you take repeated doses of opioids, you require increased medication to experience the same effect of pain relief. Respiratory depression -- This can occur in healthy people, especially with higher doses. However, people with COPD, asthma or other lung conditions may be even more susceptible to fatal respiratory impairment.  Uncommon side effects: An increased sensitivity to feeling pain and extreme response to pain (hyperalgesia). Chronic use of opioids can lead to this. Delayed gastric emptying (the process by which the contents of your stomach are moved into your small intestine). Muscle rigidity. Immune system and hormonal dysfunction. Quick, involuntary muscle jerks (myoclonus). Arrhythmia. Itchy skin (pruritus). Dry mouth (xerostomia).  Long-term side effects: Chronic constipation. Sleep-disordered breathing (SDB). Increased risk of bone fractures. Hypothalamic-pituitary-adrenal dysregulation. Increased risk of overdose.  RISKS: Fractures and Falls:  Opioids increase the risk and incidence of falls. This is of particular importance in elderly patients.  Endocrine System:  Long-term administration is associated with endocrine abnormalities (endocrinopathies). (Also known as Opioid-induced Endocrinopathy) Influences  on both the hypothalamic-pituitary-adrenal axis?and the hypothalamic-pituitary-gonadal axis have been demonstrated with consequent hypogonadism and adrenal insufficiency in both sexes. Hypogonadism and decreased levels of dehydroepiandrosterone sulfate have been reported in men and women. Endocrine effects include: Amenorrhoea in women (abnormal absence of menstruation) Reduced libido in both sexes Decreased sexual function Erectile dysfunction in men Hypogonadisms (decreased testicular function with shrinkage of testicles) Infertility Depression and fatigue Loss of muscle mass Anxiety Depression Immune suppression Hyperalgesia Weight gain Anemia Osteoporosis Patients (particularly women of childbearing age) should avoid opioids. There is insufficient evidence to recommend routine monitoring of asymptomatic patients taking opioids in the long-term for hormonal deficiencies.  Immune System: Human studies have demonstrated that opioids have an immunomodulating effect. These effects are mediated via opioid receptors both on immune effector cells and in the central nervous system. Opioids have been demonstrated to have adverse effects on antimicrobial response and anti-tumour surveillance. Buprenorphine has been demonstrated to have no impact on immune function.  Opioid Induced Hyperalgesia: Human studies have demonstrated that prolonged use of opioids can lead to a state of abnormal pain sensitivity, sometimes called opioid induced hyperalgesia (OIH). Opioid induced hyperalgesia is not usually seen in the absence of tolerance to opioid analgesia. Clinically, hyperalgesia may be diagnosed if the patient on long-term opioid therapy presents with increased pain. This might be qualitatively and anatomically distinct from pain related to disease progression or to breakthrough pain resulting from development of opioid tolerance. Pain associated with hyperalgesia tends to be more diffuse than the  pre-existing pain and less defined in quality. Management of opioid induced hyperalgesia requires opioid dose reduction.  Cancer: Chronic opioid therapy has been associated with an increased risk of cancer among noncancer patients with chronic pain. This association was more evident in chronic strong opioid users. Chronic opioid consumption causes significant pathological changes in the small intestine and colon. Epidemiological studies have found that there is a link between opium dependence and initiation of gastrointestinal cancers. Cancer is the second leading cause of death after cardiovascular disease. Chronic use of opioids can cause multiple conditions such as GERD, immunosuppression and renal damage as well as carcinogenic effects, which are associated with the incidence of cancers.   Mortality: Long-term opioid use   has been associated with increased mortality among patients with chronic non-cancer pain (CNCP).  Prescription of long-acting opioids for chronic noncancer pain was associated with a significantly increased risk of all-cause mortality, including deaths from causes other than overdose.  Reference: Von Korff M, Kolodny A, Deyo RA, Chou R. Long-term opioid therapy reconsidered. Ann Intern Med. 2011 Sep 6;155(5):325-8. doi: 10.7326/[id number]. PMID: 21893626; PMCID: PMC3280085. Bedson J, Chen Y, Ashworth J, Hayward RA, Dunn KM, Jordan KP. Risk of adverse events in patients prescribed long-term opioids: A cohort study in the [place] Clinical Practice Research Datalink. Eur J Pain. 2019 May;23(5):908-922. doi: 10.1002/ejp.1357. Epub 2019 Jan 31. PMID: 30620116. Colameco S, Coren JS, Ciervo CA. Continuous opioid treatment for chronic noncancer pain: a time for moderation in prescribing. Postgrad Med. 2009 Jul;121(4):61-6. doi: 10.3810/pgm.2009.07.2032. PMID: 19641271. Chou R, Turner JA, Devine EB, Hansen RN, Sullivan SD, Blazina I, Dana T, Bougatsos C, Deyo RA. The  effectiveness and risks of long-term opioid therapy for chronic pain: a systematic review for a National Institutes of Health Pathways to Prevention Workshop. Ann Intern Med. 2015 Feb 17;162(4):276-86. doi: 10.7326/M14-2559. PMID: 25581257. Warner M, Chen LH, Makuc DM. NCHS Data Brief No. 22. [place]: Centers for Disease Control and Prevention; 2009. Sep, Increase in Fatal Poisonings Involving Opioid Analgesics in the [place], 1999-2006. Song IA, Choi HR, Oh TK. Long-term opioid use and mortality in patients with chronic non-cancer pain: Ten-year follow-up study in [place] from 2010 through 2019. EClinicalMedicine. 2022 Jul 18;51:101558. doi: 10.1016/j.eclinm.2022.101558. PMID: 35875817; PMCID: PMC9304910. Huser, W., Schubert, T., Vogelmann, T. et al. All-cause mortality in patients with long-term opioid therapy compared with non-opioid analgesics for chronic non-cancer pain: a database study. BMC Med 18, 162 (2020). https://doi.org/10.1186/[email or web address] Rashidian H, Zendehdel K, Kamangar F, Malekzadeh R, Haghdoost AA. An Ecological Study of the Association between Opiate Use and Incidence of Cancers. Addict Health. 2016 Fall;8(4):252-260. PMID: 28819556; PMCID: PMC5554805.  Our Goal: Our goal is to control your pain with means other than the use of opioid pain medications.  Our Recommendation: Talk to your physician about coming off of these medications. We can assist you with the tapering down and stopping these medicines. Based on the new information, even if you cannot completely stop the medication, a decrease in the dose may be associated with a lesser risk. Ask for other means of controlling the pain. Decrease or eliminate those factors that significantly contribute to your pain such as smoking, obesity, and a diet heavily tilted towards "inflammatory" nutrients.  Last Updated: 03/16/2022    ____________________________________________________________________________________________     ____________________________________________________________________________________________  Patient Information update  To: All of our patients.  Re: Name change.  It has been made official that our current name, "[facility]"   will soon be changed to "[facility]".   The purpose of this change is to eliminate any confusion created by the concept of our practice being a "Medication Management Pain Clinic". In the past this has led to the misconception that we treat pain primarily by the use of prescription medications.  Nothing can be farther from the truth.   Understanding PAIN MANAGEMENT: To further understand what our practice does, you first have to understand that "Pain Management" is a subspecialty that requires additional training once a physician has completed their specialty training, which can be in either Anesthesia, Neurology, Psychiatry, or Physical Medicine and Rehabilitation (PMR). Each one of these contributes to the final approach taken by each physician to   the management of their patient's pain. To be a "Pain Management Specialist" you must have first completed one of the specialty trainings below.  Anesthesiologists - trained in clinical pharmacology and interventional techniques such as nerve blockade and regional as well as central neuroanatomy. They are trained to block pain before, during, and after surgical interventions.  Neurologists - trained in the diagnosis and pharmacological treatment of complex neurological conditions, such as Multiple Sclerosis, Parkinson's, spinal cord injuries, and other systemic conditions that may be associated with symptoms that may include but are not limited to pain. They tend to rely primarily on the treatment of chronic pain  using prescription medications.  Psychiatrist - trained in conditions affecting the psychosocial wellbeing of patients including but not limited to depression, anxiety, schizophrenia, personality disorders, addiction, and other substance use disorders that may be associated with chronic pain. They tend to rely primarily on the treatment of chronic pain using prescription medications.   Physical Medicine and Rehabilitation (PMR) physicians, also known as physiatrists - trained to treat a wide variety of medical conditions affecting the brain, spinal cord, nerves, bones, joints, ligaments, muscles, and tendons. Their training is primarily aimed at treating patients that have suffered injuries that have caused severe physical impairment. Their training is primarily aimed at the physical therapy and rehabilitation of those patients. They may also work alongside orthopedic surgeons or neurosurgeons using their expertise in assisting surgical patients to recover after their surgeries.  INTERVENTIONAL PAIN MANAGEMENT is sub-subspecialty of Pain Management.  Our physicians are Board-certified in Anesthesia, Pain Management, and Interventional Pain Management.  This meaning that not only have they been trained and Board-certified in their specialty of Anesthesia, and subspecialty of Pain Management, but they have also received further training in the sub-subspecialty of Interventional Pain Management, in order to become Board-certified as INTERVENTIONAL PAIN MANAGEMENT SPECIALIST.    Mission: Our goal is to use our skills in  INTERVENTIONAL PAIN MANAGEMENT as alternatives to the chronic use of prescription opioid medications for the treatment of pain. To make this more clear, we have changed our name to reflect what we do and offer. We will continue to offer medication management assessment and recommendations, but we will not be taking over any patient's medication  management.  ____________________________________________________________________________________________     ____________________________________________________________________________________________  National Pain Medication Shortage  The [place] is experiencing worsening drug shortages. These have had a negative widespread effect on patient care and treatment. Not expected to improve any time soon. Predicted to last past 2029.   Drug shortage list (generic names) Oxycodone IR Oxycodone/APAP Oxymorphone IR Hydromorphone Hydrocodone/APAP Morphine  Where is the problem?  Manufacturing and supply level.  Will this shortage affect you?  Only if you take any of the above pain medications.  How? You may be unable to fill your prescription.  Your pharmacist may offer a "partial fill" of your prescription. (Warning: Do not accept partial fills.) Prescriptions partially filled cannot be transferred to another pharmacy. Read our Medication Rules and Regulation. Depending on how much medicine you are dependent on, you may experience withdrawals when unable to get the medication.  Recommendations: Consider ending your dependence on opioid pain medications. Ask your pain specialist to assist you with the process. Consider switching to a medication currently not in shortage, such as Buprenorphine. Talk to your pain specialist about this option. Consider decreasing your pain medication requirements by managing tolerance thru "Drug Holidays". This may help minimize withdrawals, should you run out of medicine. Control your pain thru   the use of non-pharmacological interventional therapies.   Your prescriber: Prescribers cannot be blamed for shortages. Medication manufacturing and supply issues cannot be fixed by the prescriber.   NOTE: The prescriber is not responsible for supplying the medication, or solving supply issues. Work with your pharmacist to solve it. The patient is responsible for  the decision to take or continue taking the medication and for identifying and securing a legal supply source. By law, supplying the medication is the job and responsibility of the pharmacy. The prescriber is responsible for the evaluation, monitoring, and prescribing of these medications.   Prescribers will NOT: Re-issue prescriptions that have been partially filled. Re-issue prescriptions already sent to a pharmacy.  Re-send prescriptions to a different pharmacy because yours did not have your medication. Ask pharmacist to order more medicine or transfer the prescription to another pharmacy. (Read below.)  New 2023 regulation: "September 17, 2021 Revised Regulation Allows DEA-Registered Pharmacies to Transfer Electronic Prescriptions at a Patient's Request DEA Headquarters Division - Public Information Office Patients now have the ability to request their electronic prescription be transferred to another pharmacy without having to go back to their practitioner to initiate the request. This revised regulation went into effect on Monday, September 13, 2021.     At a patient's request, a DEA-registered retail pharmacy can now transfer an electronic prescription for a controlled substance (schedules II-V) to another DEA-registered retail pharmacy. Prior to this change, patients would have to go through their practitioner to cancel their prescription and have it re-issued to a different pharmacy. The process was taxing and time consuming for both patients and practitioners.    The Drug Enforcement Administration (DEA) published its intent to revise the process for transferring electronic prescriptions on December 06, 2019.  The final rule was published in the federal register on August 12, 2021 and went into effect 30 days later.  Under the final rule, a prescription can only be transferred once between pharmacies, and only if allowed under existing state or other applicable law. The prescription must  remain in its electronic form; may not be altered in any way; and the transfer must be communicated directly between two licensed pharmacists. It's important to note, any authorized refills transfer with the original prescription, which means the entire prescription will be filled at the same pharmacy".  Reference: https://www.dea.gov/stories/2023/2023-09/2021-09-01/revised-regulation-allows-dea-registered-pharmacies-transfer (DEA website announcement)  https://www.govinfo.gov/content/pkg/FR-2021-08-12/pdf/2023-15847.pdf (Federal Register  Department of Justice)   Federal Register / Vol. 88, No. 143 / Thursday, August 12, 2021 / Rules and Regulations DEPARTMENT OF JUSTICE  Drug Enforcement Administration  21 CFR Part 1306  [Docket No. [id number]]  RIN [id number]-AB64 Transfer of Electronic Prescriptions for Schedules II-V Controlled Substances Between Pharmacies for Initial Filling  ____________________________________________________________________________________________     ____________________________________________________________________________________________  Transfer of Pain Medication between Pharmacies  Re: 2023 DEA Clarification on existing regulation  Published on DEA Website: September 17, 2021  Title: Revised Regulation Allows DEA-Registered Pharmacies to Transfer Electronic Prescriptions at a Patient's Request DEA Headquarters Division - Public Information Office  "Patients now have the ability to request their electronic prescription be transferred to another pharmacy without having to go back to their practitioner to initiate the request. This revised regulation went into effect on Monday, September 13, 2021.     At a patient's request, a DEA-registered retail pharmacy can now transfer an electronic prescription for a controlled substance (schedules II-V) to another DEA-registered retail pharmacy. Prior to this change, patients would have to go through their practitioner to  cancel their prescription   and have it re-issued to a different pharmacy. The process was taxing and time consuming for both patients and practitioners.    The Drug Enforcement Administration (DEA) published its intent to revise the process for transferring electronic prescriptions on December 06, 2019.  The final rule was published in the federal register on August 12, 2021 and went into effect 30 days later.  Under the final rule, a prescription can only be transferred once between pharmacies, and only if allowed under existing state or other applicable law. The prescription must remain in its electronic form; may not be altered in any way; and the transfer must be communicated directly between two licensed pharmacists. It's important to note, any authorized refills transfer with the original prescription, which means the entire prescription will be filled at the same pharmacy."    REFERENCES: 1. DEA website announcement https://www.dea.gov/stories/2023/2023-09/2021-09-01/revised-regulation-allows-dea-registered-pharmacies-transfer  2. Department of Justice website  https://www.govinfo.gov/content/pkg/FR-2021-08-12/pdf/2023-15847.pdf  3. DEPARTMENT OF JUSTICE Drug Enforcement Administration 21 CFR Part 1306 [Docket No. [id number]] RIN [id number]-AB64 "Transfer of Electronic Prescriptions for Schedules II-V Controlled Substances Between Pharmacies for Initial Filling"  ____________________________________________________________________________________________     _______________________________________________________________________  Medication Rules  Purpose: To inform patients, and their family members, of our medication rules and regulations.  Applies to: All patients receiving prescriptions from our practice (written or electronic).  Pharmacy of record: This is the pharmacy where your electronic prescriptions will be sent. Make sure we have the correct one.  Electronic prescriptions: In  compliance with the [place] Strengthen Opioid Misuse Prevention (STOP) Act of 2017 (Session Law 2017-74/H243), effective January 17, 2018, all controlled substances must be electronically prescribed. Written prescriptions, faxing, or calling prescriptions to a pharmacy will no longer be done.  Prescription refills: These will be provided only during in-person appointments. No medications will be renewed without a "face-to-face" evaluation with your provider. Applies to all prescriptions.  NOTE: The following applies primarily to controlled substances (Opioid* Pain Medications).   Type of encounter (visit): For patients receiving controlled substances, face-to-face visits are required. (Not an option and not up to the patient.)  Patient's responsibilities: Pain Pills: Bring all pain pills to every appointment (except for procedure appointments). Pill Bottles: Bring pills in original pharmacy bottle. Bring bottle, even if empty. Always bring the bottle of the most recent fill.  Medication refills: You are responsible for knowing and keeping track of what medications you are taking and when is it that you will need a refill. The day before your appointment: write a list of all prescriptions that need to be refilled. The day of the appointment: give the list to the admitting nurse. Prescriptions will be written only during appointments. No prescriptions will be written on procedure days. If you forget a medication: it will not be "Called in", "Faxed", or "electronically sent". You will need to get another appointment to get these prescribed. No early refills. Do not call asking to have your prescription filled early. Partial  or short prescriptions: Occasionally your pharmacy may not have enough pills to fill your prescription.  NEVER ACCEPT a partial fill or a prescription that is short of the total amount of pills that you were prescribed.  With controlled substances the law allows 72 hours for  the pharmacy to complete the prescription.  If the prescription is not completed within 72 hours, the pharmacist will require a new prescription to be written. This means that you will be short on your medicine and we WILL NOT send another prescription to complete your original   prescription.  Instead, request the pharmacy to send a carrier to a nearby branch to get enough medication to provide you with your full prescription. Prescription Accuracy: You are responsible for carefully inspecting your prescriptions before leaving our office. Have the discharge nurse carefully go over each prescription with you, before taking them home. Make sure that your name is accurately spelled, that your address is correct. Check the name and dose of your medication to make sure it is accurate. Check the number of pills, and the written instructions to make sure they are clear and accurate. Make sure that you are given enough medication to last until your next medication refill appointment. Taking Medication: Take medication as prescribed. When it comes to controlled substances, taking less pills or less frequently than prescribed is permitted and encouraged. Never take more pills than instructed. Never take the medication more frequently than prescribed.  Inform other Doctors: Always inform, all of your healthcare providers, of all the medications you take. Pain Medication from other Providers: You are not allowed to accept any additional pain medication from any other Doctor or Healthcare provider. There are two exceptions to this rule. (see below) In the event that you require additional pain medication, you are responsible for notifying us, as stated below. Cough Medicine: Often these contain an opioid, such as codeine or hydrocodone. Never accept or take cough medicine containing these opioids if you are already taking an opioid* medication. The combination may cause respiratory failure and death. Medication Agreement:  You are responsible for carefully reading and following our Medication Agreement. This must be signed before receiving any prescriptions from our practice. Safely store a copy of your signed Agreement. Violations to the Agreement will result in no further prescriptions. (Additional copies of our Medication Agreement are available upon request.) Laws, Rules, & Regulations: All patients are expected to follow all Federal and State Laws, Statutes, Rules, & Regulations. Ignorance of the Laws does not constitute a valid excuse.  Illegal drugs and Controlled Substances: The use of illegal substances (including, but not limited to marijuana and its derivatives) and/or the illegal use of any controlled substances is strictly prohibited. Violation of this rule may result in the immediate and permanent discontinuation of any and all prescriptions being written by our practice. The use of any illegal substances is prohibited. Adopted CDC guidelines & recommendations: Target dosing levels will be at or below 60 MME/day. Use of benzodiazepines** is not recommended.  Exceptions: There are only two exceptions to the rule of not receiving pain medications from other Healthcare Providers. Exception #1 (Emergencies): In the event of an emergency (i.e.: accident requiring emergency care), you are allowed to receive additional pain medication. However, you are responsible for: As soon as you are able, call our office [phone], at any time of the day or night, and leave a message stating your name, the date and nature of the emergency, and the name and dose of the medication prescribed. In the event that your call is answered by a member of our staff, make sure to document and save the date, time, and the name of the person that took your information.  Exception #2 (Planned Surgery): In the event that you are scheduled by another doctor or dentist to have any type of surgery or procedure, you are allowed (for a period no  longer than 30 days), to receive additional pain medication, for the acute post-op pain. However, in this case, you are responsible for picking up a copy of   our "Post-op Pain Management for Surgeons" handout, and giving it to your surgeon or dentist. This document is available at our office, and does not require an appointment to obtain it. Simply go to our office during business hours (Monday-Thursday from 8:00 AM to 4:00 PM) (Friday 8:00 AM to 12:00 Noon) or if you have a scheduled appointment with us, prior to your surgery, and ask for it by name. In addition, you are responsible for: calling our office [phone], at any time of the day or night, and leaving a message stating your name, name of your surgeon, type of surgery, and date of procedure or surgery. Failure to comply with your responsibilities may result in termination of therapy involving the controlled substances. Medication Agreement Violation. Following the above rules, including your responsibilities will help you in avoiding a Medication Agreement Violation ("Breaking your Pain Medication Contract").  Consequences:  Not following the above rules may result in permanent discontinuation of medication prescription therapy.  *Opioid medications include: morphine, codeine, oxycodone, oxymorphone, hydrocodone, hydromorphone, meperidine, tramadol, tapentadol, buprenorphine, fentanyl, methadone. **Benzodiazepine medications include: diazepam (Valium), alprazolam (Xanax), clonazepam (Klonopine), lorazepam (Ativan), clorazepate (Tranxene), chlordiazepoxide (Librium), estazolam (Prosom), oxazepam (Serax), temazepam (Restoril), triazolam (Halcion) (Last updated: 11/09/2021) ______________________________________________________________________    ______________________________________________________________________  Medication Recommendations and Reminders  Applies to: All patients receiving prescriptions (written and/or  electronic).  Medication Rules & Regulations: You are responsible for reading, knowing, and following our "Medication Rules" document. These exist for your safety and that of others. They are not flexible and neither are we. Dismissing or ignoring them is an act of "non-compliance" that may result in complete and irreversible termination of such medication therapy. For safety reasons, "non-compliance" will not be tolerated. As with the [place] fundamental legal principle of "ignorance of the law is no defense", we will accept no excuses for not having read and knowing the content of documents provided to you by our practice.  Pharmacy of record:  Definition: This is the pharmacy where your electronic prescriptions will be sent.  We do not endorse any particular pharmacy. It is up to you and your insurance to decide what pharmacy to use.  We do not restrict you in your choice of pharmacy. However, once we write for your prescriptions, we will NOT be re-sending more prescriptions to fix restricted supply problems created by your pharmacy, or your insurance.  The pharmacy listed in the electronic medical record should be the one where you want electronic prescriptions to be sent. If you choose to change pharmacy, simply notify our nursing staff. Changes will be made only during your regular appointments and not over the phone.  Recommendations: Keep all of your pain medications in a safe place, under lock and key, even if you live alone. We will NOT replace lost, stolen, or damaged medication. We do not accept "Police Reports" as proof of medications having been stolen. After you fill your prescription, take 1 week's worth of pills and put them away in a safe place. You should keep a separate, properly labeled bottle for this purpose. The remainder should be kept in the original bottle. Use this as your primary supply, until it runs out. Once it's gone, then you know that you have 1 week's worth of medicine,  and it is time to come in for a prescription refill. If you do this correctly, it is unlikely that you will ever run out of medicine. To make sure that the above recommendation works, it is very important that you make   sure your medication refill appointments are scheduled at least 1 week before you run out of medicine. To do this in an effective manner, make sure that you do not leave the office without scheduling your next medication management appointment. Always ask the nursing staff to show you in your prescription , when your medication will be running out. Then arrange for the receptionist to get you a return appointment, at least 7 days before you run out of medicine. Do not wait until you have 1 or 2 pills left, to come in. This is very poor planning and does not take into consideration that we may need to cancel appointments due to bad weather, sickness, or emergencies affecting our staff. DO NOT ACCEPT A "Partial Fill": If for any reason your pharmacy does not have enough pills/tablets to completely fill or refill your prescription, do not allow for a "partial fill". The law allows the pharmacy to complete that prescription within 72 hours, without requiring a new prescription. If they do not fill the rest of your prescription within those 72 hours, you will need a separate prescription to fill the remaining amount, which we will NOT provide. If the reason for the partial fill is your insurance, you will need to talk to the pharmacist about payment alternatives for the remaining tablets, but again, DO NOT ACCEPT A PARTIAL FILL, unless you can trust your pharmacist to obtain the remainder of the pills within 72 hours.  Prescription refills and/or changes in medication(s):  Prescription refills, and/or changes in dose or medication, will be conducted only during scheduled medication management appointments. (Applies to both, written and electronic prescriptions.) No refills on procedure days. No  medication will be changed or started on procedure days. No changes, adjustments, and/or refills will be conducted on a procedure day. Doing so will interfere with the diagnostic portion of the procedure. No phone refills. No medications will be "called into the pharmacy". No Fax refills. No weekend refills. No Holliday refills. No after hours refills.  Remember:  Business hours are:  Monday to Thursday 8:00 AM to 4:00 PM Provider's Schedule: Raequon Catanzaro, MD - Appointments are:  Medication management: Monday and Wednesday 8:00 AM to 4:00 PM Procedure day: Tuesday and Thursday 7:30 AM to 4:00 PM Bilal Lateef, MD - Appointments are:  Medication management: Tuesday and Thursday 8:00 AM to 4:00 PM Procedure day: Monday and Wednesday 7:30 AM to 4:00 PM (Last update: 11/09/2021) ______________________________________________________________________    ____________________________________________________________________________________________  Drug Holidays  What is a "Drug Holiday"? Drug Holiday: is the name given to the process of slowly tapering down and temporarily stopping the pain medication for the purpose of decreasing or eliminating tolerance to the drug.  Benefits Improved effectiveness Decreased required effective dose Improved pain control End dependence on high dose therapy Decrease cost of therapy Uncovering "opioid-induced hyperalgesia". (OIH)  What is "opioid hyperalgesia"? It is a paradoxical increase in pain caused by exposure to opioids. Stopping the opioid pain medication, contrary to the expected, it actually decreases or completely eliminates the pain. Ref.: "A comprehensive review of opioid-induced hyperalgesia". Marion Lee, et.al. Pain Physician. 2011 Mar-Apr;14(2):145-61.  What is tolerance? Tolerance: the progressive loss of effectiveness of a pain medicine due to repetitive use. A common problem of opioid pain medications.  How long should a "Drug  Holiday" last? Effectiveness depends on the patient staying off all opioid pain medicines for a minimum of 14 consecutive days. (2 weeks)  How about just taking less of the medicine? Does not   work. Will not accomplish goal of eliminating the excess receptors.  How about switching to a different pain medicine? (AKA. "Opioid rotation") Does not work. Creates the illusion of effectiveness by taking advantage of inaccurate equivalent dose calculations between different opioids. -This "technique" was promoted by studies funded by pharmaceutical companies, such as [employer], creators of "OxyContin".  Can I stop the medicine "cold turkey"? We do not recommend it. You should always coordinate with your prescribing physician to make the transition as smoothly as possible. Avoid stopping the medicine abruptly without consulting. We recommend a "slow taper".  What is a slow taper? Taper: refers to the gradual decrease in dose.   How do I stop/taper the dose? Slowly. Decrease the daily amount of pills that you take by one (1) pill every seven (7) days. This is called a "slow downward taper". Example: if you normally take four (4) pills per day, drop it to three (3) pills per day for seven (7) days, then to two (2) pills per day for seven (7) days, then to one (1) per day for seven (7) days, and then stop the medicine. The 14 day "Drug Holiday" starts on the first day without medicine.   Will I experience withdrawals? Unlikely with a slow taper.  What triggers withdrawals? Withdrawals are triggered by the sudden/abrupt stop of high dose opioids. Withdrawals can be avoided by slowly decreasing the dose over a prolonged period of time.  What are withdrawals? Symptoms associated with sudden/abrupt reduction/stopping of high-dose, long-term use of pain medication. Withdrawal are seldom seen on low dose therapy, or patients rarely taking opioid medication.  Early Withdrawal Symptoms may  include: Agitation Anxiety Muscle aches Increased tearing Insomnia Runny nose Sweating Yawning  Late symptoms may include: Abdominal cramping Diarrhea Dilated pupils Goose bumps Nausea Vomiting  When could I see withdrawals? Onset: 8-24 hours after last use for most opioids. 12-48 hours for long-acting opioids (i.e.: methadone)  How long could they last? Duration: 4-10 days for most opioids. 14-21 days for long-acting opioids (i.e.: methadone)  What will happen after I complete my "Drug Holiday"? The need and indications for the opioid analgesic will be reviewed before restarting the medication. Dose requirements will likely decrease and the dose will need to be adjusted accordingly.   (Last update: 04/06/2022) ____________________________________________________________________________________________    ____________________________________________________________________________________________  WARNING: CBD (cannabidiol) & Delta (Delta-8 tetrahydrocannabinol) products.   Applicable to:  All individuals currently taking or considering taking CBD (cannabidiol) and, more important, all patients taking opioid analgesic controlled substances (pain medication). (Example: oxycodone; oxymorphone; hydrocodone; hydromorphone; morphine; methadone; tramadol; tapentadol; fentanyl; buprenorphine; butorphanol; dextromethorphan; meperidine; codeine; etc.)  Introduction:  Recently there has been a drive towards the use of "natural" products for the treatment of different conditions, including pain anxiety and sleep disorders. Marijuana and hemp are two varieties of the cannabis genus plants. Marijuana and its derivatives are illegal, while hemp and its derivatives are not. Cannabidiol (CBD) and tetrahydrocannabinol (THC), are two natural compounds found in plants of the Cannabis genus. They can both be extracted from hemp or marijuana. Both compounds interact with your body's endocannabinoid  system in very different ways. CBD is associated with pain relief (analgesia) while THC is associated with the psychoactive effects ("the high") obtained from the use of marijuana products. There are two main types of THC: Delta-9, which comes from the marijuana plant and it is illegal, and Delta-8, which comes from the hemp plant, and it is legal. (Both, Delta-9-THC and Delta-8-THC are psychoactive and   give you "the high".)   Legality:  Marijuana and its derivatives: illegal Hemp and its derivatives: Legal (State dependent) UPDATE: (03/05/2021) The Drug Enforcement Agency (DEA) issued a letter stating that "delta" cannabinoids, including Delta-8-THCO and Delta-9-THCO, synthetically derived from hemp do not qualify as hemp and will be viewed as Schedule I drugs. (Schedule I drugs, substances, or chemicals are defined as drugs with no currently accepted medical use and a high potential for abuse. Some examples of Schedule I drugs are: heroin, lysergic acid diethylamide (LSD), marijuana (cannabis), 3,4-methylenedioxymethamphetamine (ecstasy), methaqualone, and peyote.) (https://www.dea.gov)  Legal status of CBD in [place]:  "Conditionally Legal"  Reference: "FDA Regulation of Cannabis and Cannabis-Derived Products, Including Cannabidiol (CBD)" - https://www.fda.gov/news-events/public-health-focus/fda-regulation-cannabis-and-cannabis-derived-products-including-cannabidiol-cbd  Warning:  CBD is not FDA approved and has not undergo the same manufacturing controls as prescription drugs.  This means that the purity and safety of available CBD may be questionable. Most of the time, despite manufacturer's claims, it is contaminated with THC (delta-9-tetrahydrocannabinol - the chemical in marijuana responsible for the "HIGH").  When this is the case, the THC contaminant will trigger a positive urine drug screen (UDS) test for Marijuana (carboxy-THC).   The FDA recently put out a warning about 5 things that everyone  should be aware of regarding Delta-8 THC: Delta-8 THC products have not been evaluated or approved by the FDA for safe use and may be marketed in ways that put the public health at risk. The FDA has received adverse event reports involving delta-8 THC-containing products. Delta-8 THC has psychoactive and intoxicating effects. Delta-8 THC manufacturing often involve use of potentially harmful chemicals to create the concentrations of delta-8 THC claimed in the marketplace. The final delta-8 THC product may have potentially harmful by-products (contaminants) due to the chemicals used in the process. Manufacturing of delta-8 THC products may occur in uncontrolled or unsanitary settings, which may lead to the presence of unsafe contaminants or other potentially harmful substances. Delta-8 THC products should be kept out of the reach of children and pets.  NOTE: Because a positive UDS for any illicit substance is a violation of our medication agreement, your opioid analgesics (pain medicine) may be permanently discontinued.  MORE ABOUT CBD  General Information: CBD was discovered in 1940 and it is a derivative of the cannabis sativa genus plants (Marijuana and Hemp). It is one of the 113 identified substances found in Marijuana. It accounts for up to 40% of the plant's extract. As of 2018, preliminary clinical studies on CBD included research for the treatment of anxiety, movement disorders, and pain. CBD is available and consumed in multiple forms, including inhalation of smoke or vapor, as an aerosol spray, and by mouth. It may be supplied as an oil containing CBD, capsules, dried cannabis, or as a liquid solution. CBD is thought not to be as psychoactive as THC (delta-9-tetrahydrocannabinol - the chemical in marijuana responsible for the "HIGH"). Studies suggest that CBD may interact with different biological target receptors in the body, including cannabinoid and other neurotransmitter receptors. As of  2018 the mechanism of action for its biological effects has not been determined.  Side-effects  Adverse reactions: Dry mouth, diarrhea, decreased appetite, fatigue, drowsiness, malaise, weakness, sleep disturbances, and others.  Drug interactions:  CBD may interact with medications such as blood-thinners. CBD causes drowsiness on its own and it will increase drowsiness caused by other medications, including antihistamines (such as Benadryl), benzodiazepines (Xanax, Ativan, Valium), antipsychotics, antidepressants, opioids, alcohol and supplements such as kava, melatonin and St. John's Wort.    Other drug interactions: Brivaracetam (Briviact); Caffeine; Carbamazepine (Tegretol); Citalopram (Celexa); Clobazam (Onfi); Eslicarbazepine (Aptiom); Everolimus (Zostress); Lithium; Methadone (Dolophine); Rufinamide (Banzel); Sedative medications (CNS depressants); Sirolimus (Rapamune); Stiripentol (Diacomit); Tacrolimus (Prograf); Tamoxifen ; Soltamox); Topiramate (Topamax); Valproate; Warfarin (Coumadin); Zonisamide. (Last update: 12/27/2021) ____________________________________________________________________________________________   ____________________________________________________________________________________________  Naloxone Nasal Spray  Why am I receiving this medication? [place] STOP ACT requires that all patients taking high dose opioids or at risk of opioids respiratory depression, be prescribed an opioid reversal agent, such as Naloxone (AKA: Narcan).  What is this medication? NALOXONE (nal OX one) treats opioid overdose, which causes slow or shallow breathing, severe drowsiness, or trouble staying awake. Call emergency services after using this medication. You may need additional treatment. Naloxone works by reversing the effects of opioids. It belongs to a group of medications called opioid blockers.  COMMON BRAND NAME(S): Kloxxado, Narcan  What should I tell my care team before  I take this medication? They need to know if you have any of these conditions: Heart disease Substance use disorder An unusual or allergic reaction to naloxone, other medications, foods, dyes, or preservatives Pregnant or trying to get pregnant Breast-feeding  When to use this medication? This medication is to be used for the treatment of respiratory depression (less than 8 breaths per minute) secondary to opioid overdose.   How to use this medication? This medication is for use in the nose. Lay the person on their back. Support their neck with your hand and allow the head to tilt back before giving the medication. The nasal spray should be given into 1 nostril. After giving the medication, move the person onto their side. Do not remove or test the nasal spray until ready to use. Get emergency medical help right away after giving the first dose of this medication, even if the person wakes up. You should be familiar with how to recognize the signs and symptoms of a narcotic overdose. If more doses are needed, give the additional dose in the other nostril. Talk to your care team about the use of this medication in children. While this medication may be prescribed for children as young as newborns for selected conditions, precautions do apply.  Naloxone Overdosage: If you think you have taken too much of this medicine contact a poison control center or emergency room at once.  NOTE: This medicine is only for you. Do not share this medicine with others.  What if I miss a dose? This does not apply.  What may interact with this medication? This is only used during an emergency. No interactions are expected during emergency use. This list may not describe all possible interactions. Give your health care provider a list of all the medicines, herbs, non-prescription drugs, or dietary supplements you use. Also tell them if you smoke, drink alcohol, or use illegal drugs. Some items may interact with  your medicine.  What should I watch for while using this medication? Keep this medication ready for use in the case of an opioid overdose. Make sure that you have the phone number of your care team and local hospital ready. You may need to have additional doses of this medication. Each nasal spray contains a single dose. Some emergencies may require additional doses. After use, bring the treated person to the nearest hospital or call 911. Make sure the treating care team knows that the person has received a dose of this medication. You will receive additional instructions on what to do during and after use of this   medication before an emergency occurs.  What side effects may I notice from receiving this medication? Side effects that you should report to your care team as soon as possible: Allergic reactions--skin rash, itching, hives, swelling of the face, lips, tongue, or throat Side effects that usually do not require medical attention (report these to your care team if they continue or are bothersome): Constipation Dryness or irritation inside the nose Headache Increase in blood pressure Muscle spasms Stuffy nose Toothache This list may not describe all possible side effects. Call your doctor for medical advice about side effects. You may report side effects to FDA at 1-800-FDA-1088.  Where should I keep my medication? Because this is an emergency medication, you should keep it with you at all times.  Keep out of the reach of children and pets. Store between 20 and 25 degrees C (68 and 77 degrees F). Do not freeze. Throw away any unused medication after the expiration date. Keep in original box until ready to use.  NOTE: This sheet is a summary. It may not cover all possible information. If you have questions about this medicine, talk to your doctor, pharmacist, or health care provider.   2023 Elsevier/Gold Standard (2020-09-11  00:00:00)  ____________________________________________________________________________________________   

## 2022-04-25 NOTE — Progress Notes (Signed)
Safety precautions to be maintained throughout the outpatient stay will include: orient to surroundings, keep bed in low position, maintain call bell within reach at all times, provide assistance with transfer out of bed and ambulation.   Nursing Pain Medication Assessment:  Safety precautions to be maintained throughout the outpatient stay will include: orient to surroundings, keep bed in low position, maintain call bell within reach at all times, provide assistance with transfer out of bed and ambulation.  Medication Inspection Compliance: Pill count conducted under aseptic conditions, in front of the patient. Neither the pills nor the bottle was removed from the patient's sight at any time. Once count was completed pills were immediately returned to the patient in their original bottle.  Medication: Oxycodone IR Pill/Patch Count:  42 of 120 pills remain Pill/Patch Appearance: Markings consistent with prescribed medication Bottle Appearance: Standard pharmacy container. Clearly labeled. Filled Date: 03 / 19 / 2024 Last Medication intake:  Today   RN called to cancel 3 prescriptions of Oxycodone for fill dates of 04/26/22 and spoke with pharmacist Konrad Felix. Per Konrad Felix she cancelled these prescriptions at [id number] - [place], [place] - [address] AT [facility] [address] [facility] [address] [place], [place] [address] Phone: [phone].   Earlyne Iba, RN

## 2022-05-04 ENCOUNTER — Other Ambulatory Visit: Payer: Self-pay | Admitting: Family Medicine

## 2022-05-04 ENCOUNTER — Ambulatory Visit
Admission: RE | Admit: 2022-05-04 | Discharge: 2022-05-04 | Disposition: A | Discharge status: Home / Self Care | Payer: BC Managed Care – PPO | Source: Ambulatory Visit | Attending: Family Medicine | Admitting: Family Medicine

## 2022-05-04 DIAGNOSIS — R519 Headache, unspecified: Secondary | ICD-10-CM | POA: Diagnosis present

## 2022-05-04 DIAGNOSIS — R4020 Unspecified coma: Secondary | ICD-10-CM

## 2022-05-13 ENCOUNTER — Encounter: Payer: BC Managed Care – PPO | Attending: Physician Assistant | Admitting: Physician Assistant

## 2022-05-13 DIAGNOSIS — G894 Chronic pain syndrome: Secondary | ICD-10-CM | POA: Insufficient documentation

## 2022-05-13 DIAGNOSIS — T8131XA Disruption of external operation (surgical) wound, not elsewhere classified, initial encounter: Secondary | ICD-10-CM | POA: Diagnosis present

## 2022-05-13 DIAGNOSIS — E11622 Type 2 diabetes mellitus with other skin ulcer: Secondary | ICD-10-CM | POA: Diagnosis not present

## 2022-05-13 DIAGNOSIS — Z7984 Long term (current) use of oral hypoglycemic drugs: Secondary | ICD-10-CM | POA: Diagnosis not present

## 2022-05-13 DIAGNOSIS — X58XXXA Exposure to other specified factors, initial encounter: Secondary | ICD-10-CM | POA: Diagnosis not present

## 2022-05-13 DIAGNOSIS — L98492 Non-pressure chronic ulcer of skin of other sites with fat layer exposed: Secondary | ICD-10-CM | POA: Diagnosis not present

## 2022-05-13 DIAGNOSIS — Z96651 Presence of right artificial knee joint: Secondary | ICD-10-CM | POA: Diagnosis not present

## 2022-05-14 NOTE — Progress Notes (Signed)
HOBERT, POPLASKI ([id number]) [id number] Page 1 of 8 Visit Report for 05/13/2022 Allergy List Details Patient Name: Date of Service: Adam Petty, Adam Petty. 05/13/2022 8:30 A M Medical Record Number: [id number] Patient Account Number: [id number] Date of Birth/Sex: Treating RN: 08/07/1960 (61 y.o. Male) Huel Coventry Primary Care Ellanore Vanhook: Jason Coop, [facility] Other Clinician: Referring Dakotah Heiman: Treating Stephen Baruch/Extender: Roanna Banning Weeks in Treatment: 0 Allergies Active Allergies No Known Drug Allergies Allergy Notes Electronic Signature(s) Signed: 05/13/2022 1:21:35 PM By: Elliot Gurney, BSN, RN, CWS, Kim RN, BSN Entered By: Elliot Gurney, BSN, RN, CWS, Kim on 05/13/2022 08:53:19 -------------------------------------------------------------------------------- Arrival Information Details Patient Name: Date of Service: Adam Petty. 05/13/2022 8:30 A M Medical Record Number: [id number] Patient Account Number: [id number] Date of Birth/Sex: Treating RN: 1960/01/31 (61 y.o. Male) Huel Coventry Primary Care Sharece Fleischhacker: Jason Coop, [facility] Other Clinician: Referring Kayson Tasker: Treating Van Ehlert/Extender: Alexis Frock in Treatment: 0 Visit Information Patient Arrived: Ambulatory Arrival Time: 08:47 Accompanied By: self Transfer Assistance: None Patient Identification Verified: Yes Secondary Verification Process Completed: Yes Patient Requires Transmission-Based Precautions: No Patient Has Alerts: Yes Patient Alerts: Patient on Blood Thinner Type II Diabetes 81mg  aspirin Electronic Signature(s) Signed: 05/13/2022 1:21:35 PM By: Elliot Gurney, BSN, RN, CWS, Kim RN, BSN Entered By: Elliot Gurney, BSN, RN, CWS, Kim on 05/13/2022 08:48:49 -------------------------------------------------------------------------------- Clinic Level of Care Assessment Details Patient Name: Date of Service: Adam Petty, Adam Petty. 05/13/2022 8:30 A M Medical Record Number:  [id number] Patient Account Number: [id number] Date of Birth/Sex: Treating RN: Mar 02, 1960 (61 y.o. Male) Huel Coventry Primary Care Rockelle Heuerman: Jason Coop, [facility] Other Clinician: Referring Prince Couey: Treating Mindy Gali/Extender: Roanna Banning Weeks in Treatment: 0 Clinic Level of Care Assessment Items TOOL 2 Quantity Score []  - 0 Use when only an EandM is performed on the INITIAL visit ASSESSMENTS - Nursing Assessment / Reassessment X- 1 20 General Physical Exam (combine w/ comprehensive assessment (listed just below) when performed on new pt. [address] KARELL, TUKES ([id number]) [id number] Page 2 of 8 X- 1 25 Comprehensive Assessment (HX, ROS, Risk Assessments, Wounds Hx, etc.) ASSESSMENTS - Wound and Skin A ssessment / Reassessment X - Simple Wound Assessment / Reassessment - one wound 1 5 []  - 0 Complex Wound Assessment / Reassessment - multiple wounds []  - 0 Dermatologic / Skin Assessment (not related to wound area) ASSESSMENTS - Ostomy and/or Continence Assessment and Care []  - 0 Incontinence Assessment and Management []  - 0 Ostomy Care Assessment and Management (repouching, etc.) PROCESS - Coordination of Care X - Simple Patient / Family Education for ongoing care 1 15 []  - 0 Complex (extensive) Patient / Family Education for ongoing care X- 1 10 Staff obtains Chiropractor, Records, T Results / Process Orders est []  - 0 Staff telephones HHA, Nursing Homes / Clarify orders / etc []  - 0 Routine Transfer to another Facility (non-emergent condition) []  - 0 Routine Hospital Admission (non-emergent condition) X- 1 15 New Admissions / Manufacturing engineer / Ordering NPWT Apligraf, etc. , []  - 0 Emergency Hospital Admission (emergent condition) X- 1 10 Simple Discharge Coordination []  - 0 Complex (extensive) Discharge Coordination PROCESS - Special Needs []  - 0 Pediatric / Minor Patient Management []  - 0 Isolation Patient  Management []  - 0 Hearing / Language / Visual special needs []  - 0 Assessment of Community assistance (transportation, D/C planning, etc.) []  - 0 Additional assistance / Altered mentation []  - 0 Support Surface(s) Assessment (bed, cushion, seat, etc.) INTERVENTIONS - Wound Cleansing / Measurement X- 1 5 Wound Imaging (  photographs - any number of wounds) []  - 0 Wound Tracing (instead of photographs) X- 1 5 Simple Wound Measurement - one wound []  - 0 Complex Wound Measurement - multiple wounds X- 1 5 Simple Wound Cleansing - one wound []  - 0 Complex Wound Cleansing - multiple wounds INTERVENTIONS - Wound Dressings X - Small Wound Dressing one or multiple wounds 1 10 []  - 0 Medium Wound Dressing one or multiple wounds []  - 0 Large Wound Dressing one or multiple wounds []  - 0 Application of Medications - injection INTERVENTIONS - Miscellaneous []  - 0 External ear exam []  - 0 Specimen Collection (cultures, biopsies, blood, body fluids, etc.) []  - 0 Specimen(s) / Culture(s) sent or taken to Lab for analysis []  - 0 Patient Transfer (multiple staff / Michiel Sites Lift / Similar devices) []  - 0 Simple Staple / Suture removal (25 or less) Adam Petty, Adam Petty ([id number]) [id number] Page 3 of 8 []  - 0 Complex Staple / Suture removal (26 or more) []  - 0 Hypo / Hyperglycemic Management (close monitor of Blood Glucose) []  - 0 Ankle / Brachial Index (ABI) - do not check if billed separately Has the patient been seen at the hospital within the last three years: Yes Total Score: 125 Level Of Care: New/Established - Level 4 Electronic Signature(s) Signed: 05/13/2022 1:21:35 PM By: Elliot Gurney, BSN, RN, CWS, Kim RN, BSN Entered By: Elliot Gurney, BSN, RN, CWS, Kim on 05/13/2022 09:46:46 -------------------------------------------------------------------------------- Encounter Discharge Information Details Patient Name: Date of Service: Adam Petty, Adam Petty. 05/13/2022 8:30 A  M Medical Record Number: [id number] Patient Account Number: [id number] Date of Birth/Sex: Treating RN: [birth year] (61 y.o. Male) Huel Coventry Primary Care Teryn Boerema: Jason Coop, [facility] Other Clinician: Referring Luvena Wentling: Treating Eileene Kisling/Extender: Alexis Frock in Treatment: 0 Encounter Discharge Information Items Discharge Condition: Stable Ambulatory Status: Ambulatory Discharge Destination: Home Transportation: Private Auto Accompanied By: self Schedule Follow-up Appointment: Yes Clinical Summary of Care: Electronic Signature(s) Signed: 05/13/2022 9:50:07 AM By: Elliot Gurney, BSN, RN, CWS, Kim RN, BSN Entered By: Elliot Gurney, BSN, RN, CWS, Kim on 05/13/2022 09:50:07 -------------------------------------------------------------------------------- Lower Extremity Assessment Details Patient Name: Date of Service: Adam Petty, Adam Petty. 05/13/2022 8:30 A M Medical Record Number: [id number] Patient Account Number: [id number] Date of Birth/Sex: Treating RN: 1960-05-01 (61 y.o. Male) Huel Coventry Primary Care Amethyst Gainer: Jason Coop, [facility] Other Clinician: Referring Danajah Birdsell: Treating Ida Uppal/Extender: Roanna Banning Weeks in Treatment: 0 Edema Assessment Assessed: [Left: No] [Right: Yes] Edema: [Left: Ye] [Right: s] Calf Left: Right: Point of Measurement: 30 cm From Medial Instep 36 cm Ankle Left: Right: Point of Measurement: 11 cm From Medial Instep 22 cm Vascular Assessment Pulses: Dorsalis Pedis Palpable: [Right:Yes] Posterior Tibial Palpable: [Right:Yes] Electronic Signature(s) DUQUAN, GILLOOLY ([id number]) [id number] Page 4 of 8 Signed: 05/13/2022 1:21:35 PM By: Elliot Gurney, BSN, RN, CWS, Kim RN, BSN Entered By: Elliot Gurney, BSN, RN, CWS, Kim on 05/13/2022 09:01:09 -------------------------------------------------------------------------------- Multi Wound Chart Details Patient Name: Date of Service: Adam Petty, Adam Petty. 05/13/2022 8:30 A  M Medical Record Number: [id number] Patient Account Number: [id number] Date of Birth/Sex: Treating RN: 05/24/1960 (61 y.o. Male) Huel Coventry Primary Care Jasmeet Gehl: Jason Coop, [facility] Other Clinician: Referring Amulya Quintin: Treating Mackena Plummer/Extender: Roanna Banning Weeks in Treatment: 0 Vital Signs Height(in): 66 Pulse(bpm): 76 Weight(lbs): 215 Blood Pressure(mmHg): 122/79 Body Mass Index(BMI): 34.7 Temperature(F): 98.0 Respiratory Rate(breaths/min): 16 [1:Photos: No Photos Right, Midline Knee Wound Location: Surgical Injury Wounding Event: Dehisced Wound Primary Etiology: 03/08/2022 Date Acquired: 0 Weeks of Treatment: Open Wound Status: No Wound Recurrence:  0.2x0.2x0.1 Measurements L x W x D (cm) 0.031 A (cm) :  rea 0.003 Volume (cm) :] [N/A:N/A N/A N/A N/A N/A N/A N/A N/A N/A N/A N/A] Treatment Notes Electronic Signature(s) Signed: 05/13/2022 9:44:11 AM By: Elliot Gurney, BSN, RN, CWS, Kim RN, BSN Entered By: Elliot Gurney, BSN, RN, CWS, Kim on 05/13/2022 09:44:11 -------------------------------------------------------------------------------- Multi-Disciplinary Care Plan Details Patient Name: Date of Service: Adam Petty, Adam Petty. 05/13/2022 8:30 A M Medical Record Number: [id number] Patient Account Number: [id number] Date of Birth/Sex: Treating RN: January 02, 1961 (61 y.o. Male) Huel Coventry Primary Care Bowen Goyal: Jason Coop, [facility] Other Clinician: Referring Taylr Meuth: Treating Madaleine Simmon/Extender: Roanna Banning Weeks in Treatment: 0 Active Inactive Orientation to the Wound Care Program Nursing Diagnoses: Knowledge deficit related to the wound healing center program Goals: Patient/caregiver will verbalize understanding of the Wound Healing Center Program Date Initiated: 05/13/2022 Target Resolution Date: 05/13/2022 Goal Status: Active Interventions: Provide education on orientation to the wound center Notes: Peripheral Neuropathy RAYJON, WERY ([id number])  [id number] Page 5 of 8 Nursing Diagnoses: Knowledge deficit related to disease process and management of peripheral neurovascular dysfunction Potential alteration in peripheral tissue perfusion (select prior to confirmation of diagnosis) Goals: Patient/caregiver will verbalize understanding of disease process and disease management Date Initiated: 05/13/2022 Target Resolution Date: 05/18/2022 Goal Status: Active Interventions: Assess signs and symptoms of neuropathy upon admission and as needed Provide education on Management of Neuropathy and Related Ulcers Provide education on Management of Neuropathy upon discharge from the Wound Center Notes: Wound/Skin Impairment Nursing Diagnoses: Impaired tissue integrity Goals: Patient/caregiver will verbalize understanding of skin care regimen Date Initiated: 05/13/2022 Target Resolution Date: 05/11/2022 Goal Status: Active Ulcer/skin breakdown will have a volume reduction of 30% by week 4 Date Initiated: 05/13/2022 Target Resolution Date: 06/08/2022 Goal Status: Active Interventions: Assess ulceration(s) every visit Treatment Activities: Skin care regimen initiated : 05/13/2022 Notes: Electronic Signature(s) Signed: 05/13/2022 9:48:26 AM By: Elliot Gurney, BSN, RN, CWS, Kim RN, BSN Entered By: Elliot Gurney, BSN, RN, CWS, Kim on 05/13/2022 09:48:25 -------------------------------------------------------------------------------- Pain Assessment Details Patient Name: Date of Service: Adam Petty, Adam Petty. 05/13/2022 8:30 A M Medical Record Number: [id number] Patient Account Number: [id number] Date of Birth/Sex: Treating RN: 10/17/1960 (61 y.o. Male) Huel Coventry Primary Care Clayvon Parlett: Jason Coop, [facility] Other Clinician: Referring Keiondre Colee: Treating Stanford Strauch/Extender: Roanna Banning Weeks in Treatment: 0 Active Problems Location of Pain Severity and Description of Pain Patient Has Paino Yes Site Locations Pain  Location: Pain in Ulcers Rate the pain. Current Pain Level: [address] EMANUELL, MORINA ([id number]) [id number] Page 6 of 8 Character of Pain Describe the Pain: Throbbing Pain Management and Medication Current Pain Management: Electronic Signature(s) Signed: 05/13/2022 1:21:35 PM By: Elliot Gurney, BSN, RN, CWS, Kim RN, BSN Entered By: Elliot Gurney, BSN, RN, CWS, Kim on 05/13/2022 08:49:16 -------------------------------------------------------------------------------- Patient/Caregiver Education Details Patient Name: Date of Service: Adam Petty, Adam Petty 4/26/2024andnbsp8:30 A M Medical Record Number: [id number] Patient Account Number: [id number] Date of Birth/Gender: Treating RN: 1960/11/10 (61 y.o. Male) Huel Coventry Primary Care Physician: Jason Coop, [facility] Other Clinician: Referring Physician: Treating Physician/Extender: Alexis Frock in Treatment: 0 Education Assessment Education Provided To: Patient Education Topics Provided Pressure: Handouts: Pressure Injury: Prevention and Offloading, Other: keep pressure off of knees Methods: Explain/Verbal Responses: State content correctly Welcome T [facility]-New Patient Packet: o Handouts: A Guide to Pain Control, Fall Prevention and Safe Transfers, Nutrition, The Wound Healing Pledge form Methods: Explain/Verbal Responses: State content correctly Electronic Signature(s) Signed: 05/13/2022 1:21:35 PM By: Elliot Gurney, BSN, RN, CWS, Kim  RN, BSN Entered By: Elliot Gurney, BSN, RN, CWS, Kim on 05/13/2022 09:49:18 -------------------------------------------------------------------------------- Wound Assessment Details Patient Name: Date of Service: Adam Petty, Adam Petty 05/13/2022 8:30 A M Medical Record Number: [id number] Patient Account Number: [id number] Date of Birth/Sex: Treating RN: 18-Oct-1960 (61 y.o. Male) Huel Coventry Primary Care Jelene Albano: Jason Coop, [facility] Other Clinician: Referring Alycia Cooperwood: Treating  Betsi Crespi/Extender: Roanna Banning Weeks in Treatment: 0 Wound Status Wound Number: 1 Primary Etiology: Dehisced Wound Wound Location: Right, Midline Knee Wound Status: Open Wounding Event: Surgical Injury Comorbid History: Hepatitis C, Type II Diabetes, Osteoarthritis Date Acquired: 03/08/2022 Weeks Of Treatment: 0 Clustered Wound: No Photos Adam Petty, Adam Petty ([id number]) [id number].pdf Page 7 of 8 Wound Measurements Length: (cm) Width: (cm) Depth: (cm) Area: (cm) Volume: (cm) 0.2 % Reduction in Area: 0.2 % Reduction in Volume: 0.1 0.031 0.003 Wound Description Classification: Partial Thickness Treatment Notes Wound #1 (Knee) Wound Laterality: Right, Midline Cleanser Vashe 5.8 (oz) Discharge Instruction: Use vashe 5.8 (oz) as directed Peri-Wound Care Topical Primary Dressing Xeroform-HBD 2x2 (in/in) Discharge Instruction: Apply Xeroform-HBD 2x2 (in/in) as directed Secondary Dressing (BORDER) Zetuvit Plus SILICONE BORDER Dressing 4x4 (in/in) Discharge Instruction: Please do not put silicone bordered dressings under wraps. Use non-bordered dressing only. Secured With Compression Wrap Compression Stockings Facilities manager) Signed: 05/16/2022 4:21:36 PM By: Angelina Pih Signed: 05/18/2022 11:09:32 AM By: Elliot Gurney, BSN, RN, CWS, Kim RN, BSN Previous Signature: 05/13/2022 1:21:35 PM Version By: Elliot Gurney, BSN, RN, CWS, Kim RN, BSN Entered By: Angelina Pih on 05/16/2022 16:13:00 -------------------------------------------------------------------------------- Vitals Details Patient Name: Date of Service: Adam Petty, Adam Petty. 05/13/2022 8:30 A M Medical Record Number: [id number] Patient Account Number: [id number] Date of Birth/Sex: Treating RN: 1960-04-10 (61 y.o. Male) Huel Coventry Primary Care Cathryn Gallery: Jason Coop, [facility]ILY Other Clinician: Referring Cheyanna Strick: Treating Sherrilynn Gudgel/Extender: Roanna Banning Weeks in  Treatment: 0 Vital Signs Time Taken: 08:49 Temperature (F): 98.0 Height (in): 66 Pulse (bpm): 76 Weight (lbs): 215 Respiratory Rate (breaths/min): 16 Body Mass Index (BMI): 34.7 Blood Pressure (mmHg): 122/79 Reference Range: 80 - 120 mg / dl DARIC, KOREN ([id number]) [id number] Page 8 of 8 Electronic Signature(s) Signed: 05/13/2022 1:21:35 PM By: Elliot Gurney, BSN, RN, CWS, Kim RN, BSN Entered By: Elliot Gurney, BSN, RN, CWS, Kim on 05/13/2022 08:51:41

## 2022-05-14 NOTE — Progress Notes (Signed)
Adam Petty, Adam Petty ([id number]) [id number] Page 1 of 7 Visit Report for 05/13/2022 Chief Complaint Document Details Patient Name: Date of Service: Adam Petty, Adam Petty 05/13/2022 8:30 A M Medical Record Number: [id number] Patient Account Number: [id number] Date of Birth/Sex: Treating RN: January 18, 1960 (61 y.o. Male) Adam Petty Primary Care Provider: Jason Petty, [facility] Other Clinician: Referring Provider: Treating Provider/Extender: Adam Petty in Treatment: 0 Information Obtained from: Patient Chief Complaint Right knee ulcer Electronic Signature(s) Signed: 05/13/2022 9:16:46 AM By: Adam Derry PA-C Entered By: Adam Petty on 05/13/2022 09:16:46 -------------------------------------------------------------------------------- HPI Details Patient Name: Date of Service: Adam Petty, [name] 05/13/2022 8:30 A M Medical Record Number: [id number] Patient Account Number: [id number] Date of Birth/Sex: Treating RN: 1960-03-27 (61 y.o. Male) Adam Petty Primary Care Provider: Jason Petty, [facility] Other Clinician: Referring Provider: Treating Provider/Extender: Adam Petty in Treatment: 0 History of Present Illness HPI Description: 05-13-2022 upon evaluation today patient appears for initial inspection here in our clinic concerning an issue that he has been having over the right knee location. 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 Subsequently he had some issues with an abscess that formed and then ended up having an IandD in February on the 20th 2024. There was a stitch that was found had not absorbed and not ended up being the root cause of his problem it appears. Fortunately there does not appear to be any signs of active infection locally nor systemically which is great news. With that being said the area in question today is a very small region that he tells me just continues to seemingly drain it will  crust over and then eventually that we will look like it is about healed pop-off and then will start all over again. This has been a somewhat frustrating situation for him to be honest and I completely understand. Patient does have a history of diabetes this is managed with oral medication. He also has an issue with chronic pain in general. He takes medications for this which include anti-inflammatories as well as opioid pain medications. Electronic Signature(s) Signed: 05/13/2022 9:29:41 AM By: Adam Derry PA-C Entered By: Adam Petty on 05/13/2022 09:29:41 -------------------------------------------------------------------------------- Physical Exam Details Patient Name: Date of Service: Adam Petty, Adam Petty 05/13/2022 8:30 A M Medical Record Number: [id number] Patient Account Number: [id number] Date of Birth/Sex: Treating RN: 1960/12/19 (61 y.o. Male) Adam Petty Primary Care Provider: Jason Petty, [facility] Other Clinician: Referring Provider: Treating Provider/Extender: Adam Petty in Treatment: 0 Constitutional sitting or standing blood pressure is within target range for patient.. pulse regular and within target range for patient.Marland Kitchen respirations regular, non-labored and within target range for patient.Marland Kitchen temperature within target range for patient.. Obese and well-hydrated in no acute distress. Eyes conjunctiva clear no eyelid edema noted. pupils equal round and reactive to light and accommodation. Ears, Nose, Mouth, and Throat Adam Petty, Adam Petty ([id number]) [phone].pdf Page 2 of 7 no gross abnormality of ear auricles or external auditory canals. normal hearing noted during conversation. mucus membranes moist. Respiratory normal breathing without difficulty. Cardiovascular 2+ dorsalis pedis/posterior tibialis pulses. no clubbing, cyanosis, significant edema, <3 sec cap refill. Musculoskeletal normal gait and posture. no significant  deformity or arthritic changes, no loss or range of motion, no clubbing. Psychiatric this patient is able to make decisions and demonstrates good insight into disease process. Alert and Oriented x 3. pleasant and cooperative. Notes Upon inspection patient's wound actually is showing signs of doing extremely well at this point  although it is a very tiny area that does still have some issues here with an opening which we need to keep a close eye on. I do believe that it would be beneficial for [place] to go ahead and see about getting the patient started with a dressing to keep this from drying out I think we keep it from drying out he will actually be able to allow this to actually heal and not just crossed over and keep popping back open it does not appear to have any significant depth which is great news it is very tiny. Electronic Signature(s) Signed: 05/13/2022 9:35:18 AM By: Adam Derry PA-C Entered By: Adam Petty on 05/13/2022 09:35:18 -------------------------------------------------------------------------------- Physician Orders Details Patient Name: Date of Service: Adam Petty, Adam Petty. 05/13/2022 8:30 A M Medical Record Number: [id number] Patient Account Number: [id number] Date of Birth/Sex: Treating RN: 11-Mar-1960 (61 y.o. Male) Adam Petty Primary Care Provider: Jason Petty, [facility] Other Clinician: Referring Provider: Treating Provider/Extender: Adam Petty in Treatment: 0 Verbal / Phone Orders: No Diagnosis Coding ICD-10 Coding Code Description T81.31XA Disruption of external operation (surgical) wound, not elsewhere classified, initial encounter L98.492 Non-pressure chronic ulcer of skin of other sites with fat layer exposed E11.622 Type 2 diabetes mellitus with other skin ulcer Z79.84 Long term (current) use of oral hypoglycemic drugs G89.4 Chronic pain syndrome Follow-up Appointments Return Appointment in 1 week. Nurse Visit as needed Bathing/ Shower/  Hygiene May shower; gently cleanse wound with antibacterial soap, rinse and pat dry prior to dressing wounds Edema Control - Lymphedema / Segmental Compressive Device / Other Elevate, Exercise Daily and A void Standing for Long Periods of Time. Elevate legs to the level of the heart and pump ankles as often as possible Elevate leg(s) parallel to the floor when sitting. Wound Treatment Wound #1 - Knee Wound Laterality: Right, Midline Cleanser: Vashe 5.8 (oz) Discharge Instructions: Use vashe 5.8 (oz) as directed Prim Dressing: Xeroform-HBD 2x2 (in/in) ary Discharge Instructions: Apply Xeroform-HBD 2x2 (in/in) as directed Secondary Dressing: (BORDER) Zetuvit Plus SILICONE BORDER Dressing 4x4 (in/in) Discharge Instructions: Please do not put silicone bordered dressings under wraps. Use non-bordered dressing only. Electronic Signature(s) Signed: 05/13/2022 9:45:57 AM By: Elliot Gurney, BSN, RN, CWS, Kim RN, BSN Signed: 05/13/2022 12:43:01 PM By: Adam Derry PA-C [place],Signed: 05/13/2022 12:43:01 PM By: Olin Pia ([id number]) [id number] Page 3 of 7 Entered By: Elliot Gurney, BSN, RN, CWS, Kim on 05/13/2022 09:45:57 -------------------------------------------------------------------------------- Problem List Details Patient Name: Date of Service: Adam Petty, Adam Petty 05/13/2022 8:30 A M Medical Record Number: [id number] Patient Account Number: [id number] Date of Birth/Sex: Treating RN: [birth year] (61 y.o. Male) Adam Petty Primary Care Provider: Jason Petty, [facility] Other Clinician: Referring Provider: Treating Provider/Extender: Adam Petty in Treatment: 0 Active Problems ICD-10 Encounter Code Description Active Date MDM Diagnosis T81.31XA Disruption of external operation (surgical) wound, not elsewhere classified, 05/13/2022 No Yes initial encounter L98.492 Non-pressure chronic ulcer of skin of other sites with fat layer exposed 05/13/2022  No Yes E11.622 Type 2 diabetes mellitus with other skin ulcer 05/13/2022 No Yes Z79.84 Long term (current) use of oral hypoglycemic drugs 05/13/2022 No Yes G89.4 Chronic pain syndrome 05/13/2022 No Yes Inactive Problems Resolved Problems Electronic Signature(s) Signed: 05/13/2022 9:16:32 AM By: Adam Derry PA-C Entered By: Adam Petty on 05/13/2022 09:16:32 -------------------------------------------------------------------------------- Progress Note Details Patient Name: Date of Service: Adam Petty. 05/13/2022 8:30 A M Medical Record Number: [id number] Patient Account Number: [id number] Date of Birth/Sex: Treating RN: April 17, 1960 (62 y.o.  Male) Adam Petty Primary Care Provider: Jason Petty, [facility] Other Clinician: Referring Provider: Treating Provider/Extender: Adam Petty in Treatment: 0 Subjective Chief Complaint Information obtained from Patient Right knee ulcer History of Present Illness (HPI) 05-13-2022 upon evaluation today patient appears for initial inspection here in our clinic concerning an issue that he has been having over the right knee location. 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 Subsequently he had some issues with an abscess that formed and then ended up having an IandD in February on the 20th 2024. There was a stitch that was found had not absorbed and not ended up being the root cause of his problem it appears. Fortunately there does not appear to be any signs of active infection locally nor systemically which is great news. With that being said the area in question today is a very small region that he tells me just continues to seemingly drain it will crust over and then eventually that we will look like it is about healed pop-off and then will start all over again. This has been a somewhat frustrating situation for him to be honest and I completely understand. Adam Petty, Adam Petty ([id number])  [id number] Page 4 of 7 Patient does have a history of diabetes this is managed with oral medication. He also has an issue with chronic pain in general. He takes medications for this which include anti-inflammatories as well as opioid pain medications. Patient History Allergies No Known Drug Allergies Social History Never smoker, Marital Status - Married, Alcohol Use - Rarely, Drug Use - No History, Caffeine Use - Daily. Medical History Gastrointestinal Patient has history of Hepatitis C - antibody positive Endocrine Patient has history of Type II Diabetes Musculoskeletal Patient has history of Osteoarthritis Neurologic Denies history of Neuropathy Patient is treated with Insulin, Oral Agents. Blood sugar is tested. Blood sugar results noted at the following times: Breakfast - 165. Medical A Surgical History Notes nd Gastrointestinal IBS Review of Systems (ROS) Cardiovascular Denies complaints or symptoms of LE edema. Gastrointestinal Complains or has symptoms of Nausea. Endocrine Complains or has symptoms of Hepatitis - C antibody positive. Integumentary (Skin) Complains or has symptoms of Wounds, Bleeding or bruising tendency. Musculoskeletal Complains or has symptoms of Muscle Pain, Muscle Weakness. Neurologic Complains or has symptoms of Numbness/parasthesias - hands and feet. Objective Constitutional sitting or standing blood pressure is within target range for patient.. pulse regular and within target range for patient.Marland Kitchen respirations regular, non-labored and within target range for patient.Marland Kitchen temperature within target range for patient.. Obese and well-hydrated in no acute distress. Vitals Time Taken: 8:49 AM, Height: 66 in, Weight: 215 lbs, BMI: 34.7, Temperature: 98.0 F, Pulse: 76 bpm, Respiratory Rate: 16 breaths/min, Blood Pressure: 122/79 mmHg. Eyes conjunctiva clear no eyelid edema noted. pupils equal round and reactive to light and  accommodation. Ears, Nose, Mouth, and Throat no gross abnormality of ear auricles or external auditory canals. normal hearing noted during conversation. mucus membranes moist. Respiratory normal breathing without difficulty. Cardiovascular 2+ dorsalis pedis/posterior tibialis pulses. no clubbing, cyanosis, significant edema, Musculoskeletal normal gait and posture. no significant deformity or arthritic changes, no loss or range of motion, no clubbing. Psychiatric this patient is able to make decisions and demonstrates good insight into disease process. Alert and Oriented x 3. pleasant and cooperative. General Notes: Upon inspection patient's wound actually is showing signs of doing extremely well at this point although it is a very tiny area that does still have some issues here  with an opening which we need to keep a close eye on. I do believe that it would be beneficial for [place] to go ahead and see about getting the patient started with a dressing to keep this from drying out I think we keep it from drying out he will actually be able to allow this to actually heal and not just crossed over and keep popping back open it does not appear to have any significant depth which is great news it is very tiny. Integumentary (Hair, Skin) Wound #1 status is Open. Original cause of wound was Surgical Injury. The date acquired was: 03/08/2022. The wound is located on the Right,Midline Knee. The wound measures 0.2cm length x 0.2cm width x 0.1cm depth; 0.031cm^2 area and 0.003cm^3 volume. Adam Petty, Adam Petty ([id number]) [id number] Page 5 of 7 Assessment Active Problems ICD-10 Disruption of external operation (surgical) wound, not elsewhere classified, initial encounter Non-pressure chronic ulcer of skin of other sites with fat layer exposed Type 2 diabetes mellitus with other skin ulcer Long term (current) use of oral hypoglycemic drugs Chronic pain syndrome Plan Follow-up  Appointments: Return Appointment in 1 week. Nurse Visit as needed Bathing/ Shower/ Hygiene: May shower; gently cleanse wound with antibacterial soap, rinse and pat dry prior to dressing wounds Edema Control - Lymphedema / Segmental Compressive Device / Other: Elevate, Exercise Daily and Avoid Standing for Long Periods of Time. Elevate legs to the level of the heart and pump ankles as often as possible Elevate leg(s) parallel to the floor when sitting. WOUND #1: - Knee Wound Laterality: Right, Midline Cleanser: Vashe 5.8 (oz) Discharge Instructions: Use vashe 5.8 (oz) as directed Prim Dressing: Xeroform-HBD 2x2 (in/in) ary Discharge Instructions: Apply Xeroform-HBD 2x2 (in/in) as directed Secondary Dressing: (BORDER) Zetuvit Plus SILICONE BORDER Dressing 4x4 (in/in) Discharge Instructions: Please do not put silicone bordered dressings under wraps. Use non-bordered dressing only. 1. I am good recommend currently that based on what I am seeing we go ahead and initiate wound care measures which includes the use of the Xeroform gauze to the wound which I think is good to be the right way to go. 2. 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 We will see patient back for reevaluation in 1 week here in the clinic. If anything worsens or changes patient will contact our office for additional recommendations. Electronic Signature(s) Signed: 05/13/2022 9:47:22 AM By: Adam Derry PA-C Entered By: Adam Petty on 05/13/2022 09:47:22 -------------------------------------------------------------------------------- ROS/PFSH Details Patient Name: Date of Service: Adam Petty, Adam Petty. 05/13/2022 8:30 A M Medical Record Number: [id number] Patient Account Number: [id number] Date of Birth/Sex: Treating  RN: March 23, 1960 (61 y.o. Male) Adam Petty Primary Care Provider: Jason Petty, [facility] Other Clinician: Referring Provider: Treating Provider/Extender: Adam Petty in Treatment: 0 Cardiovascular Complaints and Symptoms: Negative for: LE edema Gastrointestinal Complaints and Symptoms: Positive for: Nausea Medical History: Positive for: Hepatitis C - antibody positive Past Medical History Notes: IBS Endocrine Complaints and Symptoms: Positive for: Hepatitis - C antibody positive Adam Petty, Adam Petty ([id number]) [id number] Page 6 of 7 Medical History: Positive for: Type II Diabetes Time with diabetes: 15 years Treated with: Insulin, Oral agents Blood sugar tested every day: Yes Tested : Blood sugar testing results: Breakfast: 165 Integumentary (Skin) Complaints and  Symptoms: Positive for: Wounds; Bleeding or bruising tendency Musculoskeletal Complaints and Symptoms: Positive for: Muscle Pain; Muscle Weakness Medical History: Positive for: Osteoarthritis Neurologic Complaints and Symptoms: Positive for: Numbness/parasthesias - hands and feet Medical History: Negative for: Neuropathy Immunizations Pneumococcal Vaccine: Received Pneumococcal Vaccination: No Implantable Devices No devices added Family and Social History Never smoker; Marital Status - Married; Alcohol Use: Rarely; Drug Use: No History; Caffeine Use: Daily Electronic Signature(s) Signed: 05/13/2022 12:43:01 PM By: Adam Derry PA-C Signed: 05/13/2022 1:21:35 PM By: Elliot Gurney, BSN, RN, CWS, Kim RN, BSN Entered By: Elliot Gurney, BSN, RN, CWS, Kim on 05/13/2022 08:57:30 -------------------------------------------------------------------------------- SuperBill Details Patient Name: Date of Service: Adam Petty 05/13/2022 Medical Record Number: [id number] Patient Account Number: [id number] Date of Birth/Sex: Treating RN: Jun 09, 1960 (61 y.o. Male) Adam Petty Primary Care  Provider: Jason Petty, [facility] Other Clinician: Referring Provider: Treating Provider/Extender: Adam Petty in Treatment: 0 Diagnosis Coding ICD-10 Codes Code Description T81.31XA Disruption of external operation (surgical) wound, not elsewhere classified, initial encounter L98.492 Non-pressure chronic ulcer of skin of other sites with fat layer exposed E11.622 Type 2 diabetes mellitus with other skin ulcer Z79.84 Long term (current) use of oral hypoglycemic drugs G89.4 Chronic pain syndrome Facility Procedures Physician Procedures : CPT4 Code Description Modifier [id number] WC PHYS LEVEL 3 NEW PT ICD-10 Diagnosis Description T81.31XA Disruption of external operation (surgical) wound, not elsewhere classified, initial encounter L98.492 Non-pressure chronic ulcer of skin of other sites  with fat layer exposed E11.622 Type 2 diabetes mellitus with other skin ulcer Z79.84 Long term (current) use of oral hypoglycemic drugs Quantity: 1 Electronic Signature(s) Signed: 05/13/2022 9:47:36 AM By: Adam Derry PA-C Entered By: Adam Petty on 05/13/2022 09:47:36

## 2022-05-14 NOTE — Progress Notes (Signed)
WELCOME, FULTS ([id number]) [id number] Page 1 of 4 Visit Report for 05/13/2022 Abuse Risk Screen Details Patient Name: Date of Service: Adam Petty, Adam Petty. 05/13/2022 8:30 A M Medical Record Number: [id number] Patient Account Number: [id number] Date of Birth/Sex: Treating RN: 1960-05-29 (61 y.o. Male) [facility] Lyvonne Cassell: Jason Coop, [facility] Other Clinician: Referring Evah Rashid: Treating Monserratt Knezevic/Extender: Roanna Banning Weeks in Treatment: 0 Abuse Risk Screen Items Answer ABUSE RISK SCREEN: Has anyone close to you tried to hurt or harm you recentlyo No Do you feel uncomfortable with anyone in your familyo No Has anyone forced you do things that you didnt want to doo No Electronic Signature(s) Signed: 05/13/2022 1:21:35 PM By: Elliot Gurney, BSN, RN, CWS, Kim RN, BSN Entered By: Elliot Gurney, BSN, RN, CWS, Kim on 05/13/2022 08:57:37 -------------------------------------------------------------------------------- Activities of Daily Living Details Patient Name: Date of Service: Adam Petty, Adam Petty. 05/13/2022 8:30 A M Medical Record Number: [id number] Patient Account Number: [id number] Date of Birth/Sex: Treating RN: [birth year] (61 y.o. Male) H[facility] Tuwanna Krausz: Jason Coop, [facility] Other Clinician: Referring Kyleigha Markert: Treating Cerissa Zeiger/Extender: Roanna Banning Weeks in Treatment: 0 Activities of Daily Living Items Answer Activities of Daily Living (Please select one for each item) Drive Automobile Completely Able T Medications ake Completely Able Use T elephone Completely Able Care for Appearance Completely Able Use T oilet Completely Able Bath / Shower Completely Able Dress Self Completely Able Feed Self Completely Able Walk Completely Able Get In / Out Bed Completely Able Housework Completely Able Prepare Meals Completely Able Handle Money Completely Able Shop for Self Completely Able Electronic  Signature(s) Signed: 05/13/2022 1:21:35 PM By: Elliot Gurney, BSN, RN, CWS, Kim RN, BSN Entered By: Elliot Gurney, BSN, RN, CWS, Kim on 05/13/2022 08:57:46 -------------------------------------------------------------------------------- Education Screening Details Patient Name: Date of Service: Adam Petty, Adam Petty. 05/13/2022 8:30 A M Medical Record Number: [id number] Patient Account Number: [id number] Date of Birth/Sex: Treating RN: 24-May-1960 (61 y.o. Male) Huel [facility] Primary [facility] Exodus Kutzer: Jason Coop, [facility] Other Clinician: Referring Donyel Nester: Treating Infinity Jeffords/Extender: Alexis Frock in Treatment: [address] OCTAVE, MONTROSE ([id number]) [id number] Page 2 of 4 Primary Learner Assessed: Patient Learning Preferences/Education Level/Primary Language Highest Education Level: College or Above Preferred Language: Economist Language Barrier: No Translator Needed: No Memory Deficit: No Emotional Barrier: No Knowledge/Comprehension Knowledge Level: High Comprehension Level: High Ability to understand written instructions: High Ability to understand verbal instructions: High Motivation Anxiety Level: Calm Cooperation: Cooperative Education Importance: Acknowledges Need Interest in Health Problems: Asks Questions Perception: Coherent Willingness to Engage in Self-Management High Activities: Readiness to Engage in Self-Management High Activities: Psychologist, prison and probation services) Signed: 05/13/2022 1:21:35 PM By: Elliot Gurney, BSN, RN, CWS, Kim RN, BSN Entered By: Elliot Gurney, BSN, RN, CWS, Kim on 05/13/2022 08:58:09 -------------------------------------------------------------------------------- Fall Risk Assessment Details Patient Name: Date of Service: Adam Petty, Adam Petty. 05/13/2022 8:30 A M Medical Record Number: [id number] Patient Account Number: [id number] Date of Birth/Sex: Treating RN: Oct 03, 1960 (61 y.o. Male) H[facility] Primary [facility] Trayquan Kolakowski: Jason Coop, [facility] Other Clinician: Referring Cashlynn Yearwood: Treating Marianne Golightly/Extender: Roanna Banning Weeks in Treatment: 0 Fall Risk Assessment Items Have you had 2 or more falls in the last 12 monthso 0 Yes Have you had any fall that resulted in injury in the last 12 monthso 0 No FALLS RISK SCREEN History of falling - immediate or within 3 months 25 Yes Secondary diagnosis (Do you have 2 or more medical diagnoseso) 0 No Ambulatory aid None/bed rest/wheelchair/nurse 0 Yes Crutches/cane/walker 0 No Furniture  0 No Intravenous therapy Access/Saline/Heparin Lock 0 No Gait/Transferring Normal/ bed rest/ wheelchair 0 Yes Weak (short steps with or without shuffle, stooped but able to lift head while walking, may seek 0 No support from furniture) Impaired (short steps with shuffle, may have difficulty arising from chair, head down, impaired 0 No balance) Mental Status Oriented to own ability 0 Yes Electronic Signature(s) Signed: 05/13/2022 1:21:35 PM By: Elliot Gurney, BSN, RN, CWS, Kim RN, BSN Entered By: Elliot Gurney, BSN, RN, CWS, Kim on 05/13/2022 08:58:31 Adam Petty, Adam Petty ([id number]) [id number] Page 3 of 4 -------------------------------------------------------------------------------- Foot Assessment Details Patient Name: Date of Service: Adam Petty, Adam Petty 05/13/2022 8:30 A M Medical Record Number: [id number] Patient Account Number: [id number] Date of Birth/Sex: Treating RN: October 17, 1960 (61 y.o. Male) Huel [facility] Primary Care Emerson Schreifels: Jason Coop, [facility] Other Clinician: Referring Sabryn Preslar: Treating James Lafalce/Extender: Roanna Banning Weeks in Treatment: 0 Foot Assessment Items Site Locations + = Sensation present, - = Sensation absent, C = Callus, U = Ulcer R = Redness, W = Warmth, M = Maceration, PU = Pre-ulcerative lesion F = Fissure, S = Swelling, D = Dryness Assessment Right: Left: Other Deformity: No No Prior Foot Ulcer: No No Prior  Amputation: No No Charcot Joint: No No Ambulatory Status: Ambulatory Without Help Gait: Steady Electronic Signature(s) Signed: 05/13/2022 1:21:35 PM By: Elliot Gurney, BSN, RN, CWS, Kim RN, BSN Entered By: Elliot Gurney, BSN, RN, CWS, Kim on 05/13/2022 08:59:06 -------------------------------------------------------------------------------- Nutrition Risk Screening Details Patient Name: Date of Service: Adam Petty, Adam Petty. 05/13/2022 8:30 A M Medical Record Number: [id number] Patient Account Number: [id number] Date of Birth/Sex: Treating RN: [birth year] (61 y.o. Male) Huel [facility] Primary [facility] Nylen Creque: Jason Coop, [facility] Other Clinician: Referring Nikoletta Varma: Treating Karine Garn/Extender: Roanna Banning Weeks in Treatment: 0 Height (in): 66 Weight (lbs): 215 Body Mass Index (BMI): 34.7 Nutrition Risk Screening Items Score Screening NUTRITION RISK SCREEN: I have an illness or condition that made me change the kind and/or amount of food I eat 0 No MAT, STUARD ([id number]) [phone] [id number].pdf Page 4 of 4 I eat fewer than two meals per day 0 No I eat few fruits and vegetables, or milk products 0 No I have three or more drinks of beer, liquor or wine almost every day 0 No I have tooth or mouth problems that make it hard for me to eat 0 No I don't always have enough money to buy the food I need 0 No I eat alone most of the time 0 No I take three or more different prescribed or over-the-counter drugs a day 1 Yes Without wanting to, I have lost or gained 10 pounds in the last six months 0 No I am not always physically able to shop, cook and/or feed myself 0 No Nutrition Protocols Good Risk Protocol Moderate Risk Protocol High Risk Proctocol Risk Level: Good Risk Score: 1 Electronic Signature(s) Signed: 05/13/2022 1:21:35 PM By: Elliot Gurney, BSN, RN, CWS, Kim RN, BSN Entered By: Elliot Gurney, BSN, RN, CWS, Kim on 05/13/2022 08:58:50

## 2022-05-17 ENCOUNTER — Encounter (INDEPENDENT_AMBULATORY_CARE_PROVIDER_SITE_OTHER): Payer: Self-pay

## 2022-05-17 ENCOUNTER — Encounter (INDEPENDENT_AMBULATORY_CARE_PROVIDER_SITE_OTHER): Payer: Self-pay | Admitting: Vascular Surgery

## 2022-05-20 ENCOUNTER — Encounter: Payer: BC Managed Care – PPO | Attending: Physician Assistant | Admitting: Physician Assistant

## 2022-05-20 DIAGNOSIS — X58XXXA Exposure to other specified factors, initial encounter: Secondary | ICD-10-CM | POA: Insufficient documentation

## 2022-05-20 DIAGNOSIS — Z96651 Presence of right artificial knee joint: Secondary | ICD-10-CM | POA: Insufficient documentation

## 2022-05-20 DIAGNOSIS — T8131XA Disruption of external operation (surgical) wound, not elsewhere classified, initial encounter: Secondary | ICD-10-CM | POA: Insufficient documentation

## 2022-05-20 DIAGNOSIS — E11622 Type 2 diabetes mellitus with other skin ulcer: Secondary | ICD-10-CM | POA: Insufficient documentation

## 2022-05-20 DIAGNOSIS — L98492 Non-pressure chronic ulcer of skin of other sites with fat layer exposed: Secondary | ICD-10-CM | POA: Insufficient documentation

## 2022-05-20 DIAGNOSIS — G894 Chronic pain syndrome: Secondary | ICD-10-CM | POA: Diagnosis not present

## 2022-05-20 DIAGNOSIS — Z7984 Long term (current) use of oral hypoglycemic drugs: Secondary | ICD-10-CM | POA: Diagnosis not present

## 2022-05-21 NOTE — Progress Notes (Addendum)
MYAIRE, WING ([id number]) [id number] Page 1 of 5 Visit Report for 05/20/2022 Chief Complaint Document Details Patient Name: Date of Service: Adam Petty, Adam Petty 05/20/2022 9:30 A M Medical Record Number: [id number] Patient Account Number: [id number] Date of Birth/Sex: Treating RN: 24-Jan-1960 (62 y.o. Adam Petty Primary Care Provider: Jason Petty, [facility] Other Clinician: Referring Provider: Treating Provider/Extender: Adam Petty Weeks in Treatment: 1 Information Obtained from: Patient Chief Complaint Right knee ulcer Electronic Signature(s) Signed: 05/20/2022 9:25:29 AM By: Adam Petty Entered By: Adam Derry on 05/20/2022 09:25:29 -------------------------------------------------------------------------------- HPI Details Patient Name: Date of Service: Adam Petty, Adam Petty. 05/20/2022 9:30 A M Medical Record Number: [id number] Patient Account Number: [id number] Date of Birth/Sex: Treating RN: 11/05/1960 (62 y.o. Adam Petty Primary Care Provider: Jason Petty, [facility] Other Clinician: Referring Provider: Treating Provider/Extender: Adam Petty Weeks in Treatment: 1 History of Present Illness HPI Description: 05-13-2022 upon evaluation today patient appears for initial inspection here in our clinic concerning an issue that he has been having over the right knee location. 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 Subsequently he had some issues with an abscess that formed and then ended up having an IandD in February on the 20th 2024. There was a stitch that was found had not absorbed and not ended up being the root cause of his problem it appears. Fortunately there does not appear to be any signs of active infection locally nor systemically which is great news. With that being said the area in question today is a very small region that he tells me just continues to seemingly drain it will  crust over and then eventually that we will look like it is about healed pop-off and then will start all over again. This has been a somewhat frustrating situation for him to be honest and I completely understand. Patient does have a history of diabetes this is managed with oral medication. He also has an issue with chronic pain in general. He takes medications for this which include anti-inflammatories as well as opioid pain medications. 05-20-2022 upon evaluation today patient appears to be doing well currently in regard to his wound. He has been tolerating the dressing changes without complication. Honestly I am not sure there is much open at this point but we are still Monitoring the situation. Electronic Signature(s) Signed: 05/20/2022 10:00:30 AM By: Adam Petty Entered By: Adam Derry on 05/20/2022 10:00:29 -------------------------------------------------------------------------------- Physical Exam Details Patient Name: Date of Service: Adam Petty, Adam Petty 05/20/2022 9:30 A M Medical Record Number: [id number] Patient Account Number: [id number] Date of Birth/Sex: Treating RN: 11-Sep-1960 (62 y.o. Adam Petty Primary Care Provider: Jason Petty, [facility] Other Clinician: Betha Petty Referring Provider: Treating Provider/Extender: Adam Petty Weeks in Treatment: 1 Constitutional Well-nourished and well-hydrated in no acute distress. Respiratory normal breathing without difficulty. RANDON, [name] ([id number]) [id number] Page 2 of 5 Psychiatric this patient is able to make decisions and demonstrates good insight into disease process. Alert and Oriented x 3. pleasant and cooperative. Notes Upon inspection patient's wound bed actually showed signs of good granulation epithelization at this point. Fortunately I do not see any signs of significant opening in fact there might be a pinpoint working to continue to monitor but I am thinking  that this might actually be closed. Electronic Signature(s) Signed: 05/20/2022 10:00:43 AM By: Adam Petty Entered By: Adam Derry on 05/20/2022 10:00:43 -------------------------------------------------------------------------------- Physician Orders Details Patient Name: Date of Service:  Adam Petty, Adam W. 05/20/2022 9:30 A M Medical Record Number: [id number] Patient Account Number: [id number] Date of Birth/Sex: Treating RN: 15-Jun-1960 (62 y.o. 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 Nurse Visit as needed Bathing/ Shower/ Hygiene May shower; gently cleanse wound with antibacterial soap, rinse and pat dry prior to dressing wounds Edema Control - Lymphedema / Segmental Compressive Device / Other Elevate, Exercise Daily and A void Standing for Long Periods of Time. Elevate legs to the level of the heart and pump ankles as often as possible Elevate leg(s) parallel to the floor when sitting. Wound Treatment Wound #1 - Knee Wound Laterality: Right, Midline Cleanser: Vashe 5.8 (oz) Discharge Instructions: Use vashe 5.8 (oz) as directed Prim Dressing: Xeroform-HBD 2x2 (in/in) ary Discharge Instructions: Apply Xeroform-HBD 2x2 (in/in) as directed Secondary Dressing: (BORDER) Zetuvit Plus SILICONE BORDER Dressing 4x4 (in/in) Discharge  Instructions: Please do not put silicone bordered dressings under wraps. Use non-bordered dressing only. Electronic Signature(s) Signed: 05/20/2022 11:56:30 AM By: Adam Petty Signed: 05/20/2022 1:06:24 PM By: Adam Aver MSN RN CNS WTA Entered By: Adam Petty on 05/20/2022 10:05:34 -------------------------------------------------------------------------------- Problem List Details Patient Name: Date of Service: Adam Petty, Adam Petty. 05/20/2022 9:30 A M Medical Record Number: [id number] Patient Account Number: [id number] Date of Birth/Sex: Treating RN: September 09, 1960 (62 y.o. Adam Petty Primary Care Provider: Jason Petty, [facility] Other Clinician: MASYN, ZANI ([id number]) [id number] Page 3 of 5 Referring Provider: Treating Provider/Extender: Adam Petty Weeks in Treatment: 1 Active Problems ICD-10 Encounter Code Description Active Date MDM Diagnosis T81.31XA Disruption of external operation (surgical) wound, not elsewhere classified, 05/13/2022 No Yes initial encounter L98.492 Non-pressure chronic ulcer of skin of other sites with fat layer exposed 05/13/2022 No Yes E11.622 Type 2 diabetes mellitus with other skin ulcer 05/13/2022 No Yes Z79.84 Long term (current) use of oral hypoglycemic drugs 05/13/2022 No Yes G89.4 Chronic pain syndrome 05/13/2022 No Yes Inactive Problems Resolved Problems Electronic Signature(s) Signed: 05/20/2022 9:25:24 AM By: Adam Petty Entered By: Adam Derry on 05/20/2022 09:25:24 -------------------------------------------------------------------------------- Progress Note Details Patient Name: Date of Service: Adam Bodo. 05/20/2022 9:30 A M Medical Record Number: [id number] Patient Account Number: [id number] Date of Birth/Sex: Treating RN: 1960-04-07 (62 y.o. Adam Petty Primary Care Provider: Jason Petty, [facility] Other Clinician: Betha Petty Referring Provider: Treating  Provider/Extender: Adam Petty Weeks in Treatment: 1 Subjective Chief Complaint Information obtained from Patient Right knee ulcer History of Present Illness (HPI) 05-13-2022 upon evaluation today patient appears for initial inspection here in our clinic concerning an issue that he has been having over the right knee location. 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 Subsequently he had some issues with an abscess that formed and then ended up having an IandD in February on the 20th 2024. There was a stitch that was found had not absorbed and not ended up being the root cause of his problem it appears. Fortunately there does  not appear to be any signs of active infection locally nor systemically which is great news. With that being said the area in question today is a very small region that he tells me just continues to seemingly drain it will crust over and then eventually that we will look like it is about healed pop-off and then will start all over again. This has been a somewhat frustrating situation for him to be honest and I completely understand. Patient does have a history of diabetes this is managed with oral medication. He also has an issue with chronic pain in general. He takes medications for this which include anti-inflammatories as well as opioid pain medications. 05-20-2022 upon evaluation today patient appears to be doing well currently in regard to his wound. He has been tolerating the dressing changes without complication. Honestly I am not sure there is much open at this point but we are still Monitoring the situation. Adam Petty, Adam Petty ([id number]) [id number] Page 4 of 5 Objective Constitutional Well-nourished and well-hydrated in no acute distress. Vitals Time Taken: 9:40 AM, Height: 66 in, Weight: 215 lbs, BMI: 34.7, Temperature: 98.1 F, Pulse: 82 bpm, Respiratory Rate: 18 breaths/min, Blood  Pressure: 142/78 mmHg. Respiratory normal breathing without difficulty. Psychiatric this patient is able to make decisions and demonstrates good insight into disease process. Alert and Oriented x 3. pleasant and cooperative. General Notes: Upon inspection patient's wound bed actually showed signs of good granulation epithelization at this point. Fortunately I do not see any signs of significant opening in fact there might be a pinpoint working to continue to monitor but I am thinking that this might actually be closed. Integumentary (Hair, Skin) Wound #1 status is Open. Original cause of wound was Surgical Injury. The date acquired was: 03/08/2022. The wound has been in treatment 1 weeks. The wound is located on the Right,Midline Knee. The wound measures 0.1cm length x 0.1cm width x 0.1cm depth; 0.008cm^2 area and 0.001cm^3 volume. There is no tunneling or undermining noted. 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 Nurse Visit as needed Bathing/ Shower/ Hygiene: May shower; gently cleanse wound with antibacterial soap, rinse and pat dry prior to dressing wounds Edema Control - Lymphedema / Segmental Compressive Device / Other: Elevate, Exercise Daily and Avoid Standing for Long Periods of Time. Elevate legs to the level of the heart and pump ankles as often as possible Elevate leg(s) parallel to the floor when sitting. WOUND #1: - Knee Wound Laterality: Right, Midline Cleanser: Vashe 5.8 (oz) Discharge Instructions: Use vashe 5.8 (oz) as directed Prim Dressing: Xeroform-HBD 2x2 (in/in) ary Discharge Instructions: Apply Xeroform-HBD 2x2 (in/in) as  directed Secondary Dressing: (BORDER) Zetuvit Plus SILICONE BORDER Dressing 4x4 (in/in) Discharge Instructions: Please do not put silicone bordered dressings under wraps. Use non-bordered dressing only. 1. I would recommend that we go ahead and continue with the wound care measures as before and the patient is in agreement the plan. This includes the use of the Xeroform gauze dressing which I think is really doing a great job here. 2. I am also can recommend that we have the patient continue to monitor for any signs of infection or worsening or even drainage right now were not seeing any but that is  been an issue ongoing back-and-forth for so long. 3. We will continue with the bordered foam dressing as well which I think is protecting this quite nicely. We will see patient back for reevaluation in 2 weeks here in the clinic. If anything worsens or changes patient will contact our office for additional recommendations. I am hopeful it will still be closed by that time and then he will be ready for discharge. Electronic Signature(s) Signed: 05/20/2022 10:01:23 AM By: Adam Petty Entered By: Adam Derry on 05/20/2022 10:01:23 SuperBill Details -------------------------------------------------------------------------------- Adam Petty ([id number]) [id number] Page 5 of 5 Patient Name: Date of Service: Adam Petty, Adam Petty 05/20/2022 Medical Record Number: [id number] Patient Account Number: [id number] Date of Birth/Sex: Treating RN: [birth year] (62 y.o. Adam Petty Primary Care Provider: Jason Petty, Petty Other Clinician: Betha Petty Referring Provider: Treating Provider/Extender: Adam Petty Weeks in Treatment: 1 Diagnosis Coding ICD-10 Codes Code Description T81.31XA Disruption of external operation (surgical) wound, not elsewhere classified, initial encounter L98.492 Non-pressure chronic ulcer of skin of other sites with fat layer  exposed E11.622 Type 2 diabetes mellitus with other skin ulcer Z79.84 Long term (current) use of oral hypoglycemic drugs G89.4 Chronic pain syndrome Facility Procedures CPT4 Code Description Modifier Quantity [id number] [phone] - WOUND CARE VISIT-LEV 2 EST PT 1 Physician Procedures Quantity CPT4 Code Description Modifier [id number] - WC PHYS LEVEL 3 - EST PT 1 ICD-10 Diagnosis Description T81.31XA Disruption of external operation (surgical) wound, not elsewhere classified, initial encounter L98.492 Non-pressure chronic ulcer of skin of other sites with fat layer exposed E11.622 Type 2 diabetes mellitus with other skin ulcer Z79.84 Long term (current) use of oral hypoglycemic drugs Electronic Signature(s) Signed: 05/20/2022 11:56:30 AM By: Adam Petty Signed: 05/20/2022 1:06:24 PM By: Adam Aver MSN RN CNS WTA Previous Signature: 05/20/2022 10:01:34 AM Version By: Adam Petty Entered By: Adam Petty on 05/20/2022 10:05:46

## 2022-05-21 NOTE — Progress Notes (Addendum)
ALEKZANDER, KARGES ([id number]) [id number] Page 1 of 8 Visit Report for 05/20/2022 Arrival Information Details Patient Name: Date of Service: Adam Petty, Adam Petty 05/20/2022 9:30 A M Medical Record Number: [id number] Patient Account Number: [id number] Date of Birth/Sex: Treating RN: 03/05/1960 (62 y.o. Roel Cluck Primary Care Eloni Darius: Jason Coop, [facility] Other Clinician: Betha Loa Referring Ila Landowski: Treating Berlin Mokry/Extender: Laurette Schimke, EMILY Weeks in Treatment: 1 Visit Information History Since Last Visit All ordered tests and consults were completed: No Patient Arrived: Ambulatory Added or deleted any medications: No Arrival Time: 09:38 Any new allergies or adverse reactions: No Transfer Assistance: None Had a fall or experienced change in No Patient Identification Verified: Yes activities of daily living that may affect Secondary Verification Process Completed: Yes risk of falls: Patient Requires Transmission-Based Precautions: No Signs or symptoms of abuse/neglect since last visito No Patient Has Alerts: Yes Hospitalized since last visit: No Patient Alerts: Patient on Blood Thinner Implantable device outside of the clinic excluding No Type II Diabetes cellular tissue based products placed in the center 81mg  aspirin since last visit: Has Dressing in Place as Prescribed: Yes Pain Present Now: Yes Electronic Signature(s) Signed: 05/20/2022 1:06:24 PM By: Midge Aver MSN RN CNS WTA Entered By: Midge Aver on 05/20/2022 07:04:43 -------------------------------------------------------------------------------- Clinic Level of Care Assessment Details Patient Name: Date of Service: Adam Petty, Adam Petty. 05/20/2022 9:30 A M Medical Record Number: [id number] Patient Account Number: [id number] Date of Birth/Sex: Treating RN: 07/20/1960 (62 y.o. Roel Cluck Primary Care Wynter Isaacs: Jason Coop, [facility] Other Clinician: Betha Loa Referring Naomi Castrogiovanni: Treating Danni Shima/Extender: Raynelle Fanning NKLIN, EMILY Weeks in Treatment: 1 Clinic Level of Care Assessment Items TOOL 4 Quantity Score []  - 0 Use when only an EandM is performed on FOLLOW-UP visit ASSESSMENTS - Nursing Assessment / Reassessment X- 1 10 Reassessment of Co-morbidities (includes updates in patient status) X- 1 5 Reassessment of Adherence to Treatment Plan Adam Petty, Adam Petty ([id number]) [id number] Page 2 of 8 ASSESSMENTS - Wound and Skin A ssessment / Reassessment X - Simple Wound Assessment / Reassessment - one wound 1 5 []  - 0 Complex Wound Assessment / Reassessment - multiple wounds []  - 0 Dermatologic / Skin Assessment (not related to wound area) ASSESSMENTS - Focused Assessment []  - 0 Circumferential Edema Measurements - multi extremities []  - 0 Nutritional Assessment / Counseling / Intervention []  - 0 Lower Extremity Assessment (monofilament, tuning fork, pulses) []  - 0 Peripheral Arterial Disease Assessment (using hand held doppler) ASSESSMENTS - Ostomy and/or Continence Assessment and Care []  - 0 Incontinence Assessment and Management []  - 0 Ostomy Care Assessment and Management (repouching, etc.) PROCESS - Coordination of Care X - Simple Patient / Family Education for ongoing care 1 15 []  - 0 Complex (extensive) Patient / Family Education for ongoing care []  - 0 Staff obtains Chiropractor, Records, T Results / Process Orders est []  - 0 Staff telephones HHA, Nursing Homes / Clarify orders / etc []  - 0 Routine Transfer to another Facility (non-emergent condition) []  - 0 Routine Hospital Admission (non-emergent condition) []  - 0 New Admissions / Manufacturing engineer / Ordering NPWT Apligraf, etc. , []  - 0 Emergency Hospital Admission (emergent condition) X- 1 10 Simple Discharge Coordination []  - 0 Complex (extensive) Discharge Coordination PROCESS - Special Needs []  - 0 Pediatric /  Minor Patient Management []  - 0 Isolation Patient Management []  - 0 Hearing / Language / Visual special needs []  - 0 Assessment of Community assistance (transportation, D/C planning, etc.) []  -  0 Additional assistance / Altered mentation []  - 0 Support Surface(s) Assessment (bed, cushion, seat, etc.) INTERVENTIONS - Wound Cleansing / Measurement X - Simple Wound Cleansing - one wound 1 5 []  - 0 Complex Wound Cleansing - multiple wounds X- 1 5 Wound Imaging (photographs - any number of wounds) []  - 0 Wound Tracing (instead of photographs) []  - 0 Simple Wound Measurement - one wound []  - 0 Complex Wound Measurement - multiple wounds INTERVENTIONS - Wound Dressings X - Small Wound Dressing one or multiple wounds 1 10 []  - 0 Medium Wound Dressing one or multiple wounds []  - 0 Large Wound Dressing one or multiple wounds []  - 0 Application of Medications - topical []  - 0 Application of Medications - injection INTERVENTIONS - Miscellaneous []  - 0 External ear exam Adam Petty, Adam Petty ([id number]) [id number] Page 3 of 8 []  - 0 Specimen Collection (cultures, biopsies, blood, body fluids, etc.) []  - 0 Specimen(s) / Culture(s) sent or taken to Lab for analysis []  - 0 Patient Transfer (multiple staff / Michiel Sites Lift / Similar devices) []  - 0 Simple Staple / Suture removal (25 or less) []  - 0 Complex Staple / Suture removal (26 or more) []  - 0 Hypo / Hyperglycemic Management (close monitor of Blood Glucose) []  - 0 Ankle / Brachial Index (ABI) - do not check if billed separately X- 1 5 Vital Signs Has the patient been seen at the hospital within the last three years: Yes Total Score: 70 Level Of Care: New/Established - Level 2 Electronic Signature(s) Signed: 05/20/2022 1:06:24 PM By: Midge Aver MSN RN CNS WTA Entered By: Midge Aver on 05/20/2022 07:05:39 -------------------------------------------------------------------------------- Encounter  Discharge Information Details Patient Name: Date of Service: Adam Petty. 05/20/2022 9:30 A M Medical Record Number: [id number] Patient Account Number: [id number] Date of Birth/Sex: Treating RN: [birth year] (62 y.o. Roel Cluck Primary Care Sharda Keddy: Jason Coop, [facility] Other Clinician: Betha Loa Referring Corneilus Heggie: Treating Consetta Cosner/Extender: Laurette Schimke, EMILY Weeks in Treatment: 1 Encounter Discharge Information Items Discharge Condition: Stable Ambulatory Status: Ambulatory Discharge Destination: Home Transportation: Private Auto Accompanied By: self Schedule Follow-up Appointment: Yes Clinical Summary of Care: Electronic Signature(s) Signed: 05/20/2022 1:06:24 PM By: Midge Aver MSN RN CNS WTA Entered By: Midge Aver on 05/20/2022 07:06:48 Lower Extremity Assessment Details -------------------------------------------------------------------------------- Adam Petty ([id number]) [id number] Page 4 of 8 Patient Name: Date of Service: Adam Petty, Adam Petty 05/20/2022 9:30 A M Medical Record Number: [id number] Patient Account Number: [id number] Date of Birth/Sex: Treating RN: February 09, 1960 (62 y.o. Roel Cluck Primary Care Shaunta Oncale: Jason Coop, EMILY Other Clinician: Betha Loa Referring Mckinzey Entwistle: Treating Vinnie Gombert/Extender: Raynelle Fanning NKLIN, EMILY Weeks in Treatment: 1 Edema Assessment Left: Right: Assessed: No Yes Edema: No Calf Left: Right: Point of Measurement: 30 cm From Medial Instep 35.7 cm Ankle Left: Right: Point of Measurement: 11 cm From Medial Instep 21.8 cm Vascular Assessment Left: Right: Pulses: Dorsalis Pedis Palpable: Yes Electronic Signature(s) Signed: 05/20/2022 1:06:24 PM By: Midge Aver MSN RN CNS WTA Entered By: Midge Aver on 05/20/2022 07:05:13 -------------------------------------------------------------------------------- Multi Wound Chart Details Patient Name: Date of  Service: Adam Petty. 05/20/2022 9:30 A M Medical Record Number: [id number] Patient Account Number: [id number] Date of Birth/Sex: Treating RN: 1960/06/20 (62 y.o. Roel Cluck Primary Care Maveryck Bahri: Jason Coop, [facility] Other Clinician: Betha Loa Referring Deloyce Walthers: Treating Aqil Goetting/Extender: Raynelle Fanning NKLIN, EMILY Weeks in Treatment: 1 Vital Signs Height(in): 66 Pulse(bpm): 82 Weight(lbs): 215 Blood Pressure(mmHg): 142/78 Body Mass Index(BMI): 34.7 Temperature(F):  98.1 Respiratory Rate(breaths/min): 18 [1:Photos:] [N/A:N/A] Right, Midline Knee N/A N/A Wound Location: Surgical Injury N/A N/A Wounding Event: TOREZ, BRATLAND ([id number]) [id number] Page 5 of 8 Dehisced Wound N/A N/A Primary Etiology: Hepatitis C, Type II Diabetes, N/A N/A Comorbid History: Osteoarthritis 03/08/2022 N/A N/A Date Acquired: 1 N/A N/A Weeks of Treatment: Open N/A N/A Wound Status: No N/A N/A Wound Recurrence: 0.1x0.1x0.1 N/A N/A Measurements L x W x D (cm) 0.008 N/A N/A A (cm) : rea 0.001 N/A N/A Volume (cm) : 74.20% N/A N/A % Reduction in A rea: 66.70% N/A N/A % Reduction in Volume: Partial Thickness N/A N/A Classification: Medium (34-66%) N/A N/A Epithelialization: Treatment Notes Electronic Signature(s) Signed: 05/20/2022 1:06:24 PM By: Midge Aver MSN RN CNS WTA Entered By: Midge Aver on 05/20/2022 07:05:19 -------------------------------------------------------------------------------- Multi-Disciplinary Care Plan Details Patient Name: Date of Service: Adam Petty. 05/20/2022 9:30 A M Medical Record Number: [id number] Patient Account Number: [id number] Date of Birth/Sex: Treating RN: 07/06/1960 (62 y.o. Roel Cluck Primary Care Kinta Martis: Jason Coop, EMILY Other Clinician: Betha Loa Referring Aarin Bluett: Treating Tkeya Stencil/Extender: Laurette Schimke, EMILY Weeks in Treatment: 1 Active Inactive Electronic  Signature(s) Signed: 07/11/2022 9:31:00 AM By: Elliot Gurney, BSN, RN, CWS, Kim RN, BSN Signed: 09/16/2022 2:46:29 PM By: Midge Aver MSN RN CNS WTA Previous Signature: 05/20/2022 1:06:24 PM Version By: Midge Aver MSN RN CNS WTA Entered By: Elliot Gurney, BSN, RN, CWS, Kim on 07/11/2022 06:31:00 -------------------------------------------------------------------------------- Pain Assessment Details Patient Name: Date of Service: Adam Petty, Adam Petty 05/20/2022 9:30 A M Medical Record Number: [id number] Patient Account Number: [id number] Date of Birth/Sex: Treating RN: Jan 11, 1961 (62 y.o. Roel Cluck Primary Care Emoni Whitworth: Jason Coop, [facility] Other Clinician: Betha Loa Referring Verlin Duke: Treating Julann Mcgilvray/Extender: Laurette Schimke, EMILY Weeks in Treatment: 1 Adam Petty, Adam Petty ([id number]) [id number] Page 6 of 8 Active Problems Location of Pain Severity and Description of Pain Patient Has Paino Yes Site Locations Pain Location: Pain in Ulcers Duration of the Pain. Constant / Intermittento Constant Rate the pain. Current Pain Level: 2 Character of Pain Describe the Pain: Burning, Other: stinging Pain Management and Medication Current Pain Management: Medication: Yes Cold Application: No Rest: No Massage: No Activity: No T.E.N.S.: No Heat Application: No Leg drop or elevation: No Is the Current Pain Management Adequate: Inadequate How does your wound impact your activities of daily livingo Sleep: No Bathing: No Appetite: No Relationship With Others: No Bladder Continence: No Emotions: No Bowel Continence: No Work: No Toileting: No Drive: No Dressing: No Hobbies: No Electronic Signature(s) Signed: 05/20/2022 1:06:24 PM By: Midge Aver MSN RN CNS WTA Entered By: Midge Aver on 05/20/2022 07:05:05 -------------------------------------------------------------------------------- Patient/Caregiver Education Details Patient Name: Date of  Service: Adam Petty 5/3/2024andnbsp9:30 A M Medical Record Number: [id number] Patient Account Number: [id number] Date of Birth/Gender: Treating RN: 04/08/1960 (62 y.o. Roel Cluck Primary Care Physician: Jason Coop, [facility] Other Clinician: Betha Loa Referring Physician: Treating Physician/Extender: Laurette Schimke, EMILY Weeks in Treatment: 1 Education Assessment Education Provided To: Adam Petty, Adam Petty ([id number]) [id number] Page 7 of 8 Patient Education Topics Provided Wound/Skin Impairment: Handouts: Other: continue wound care as directed Methods: Explain/Verbal Responses: State content correctly Electronic Signature(s) Signed: 05/20/2022 1:06:24 PM By: Midge Aver MSN RN CNS WTA Entered By: Midge Aver on 05/20/2022 07:06:15 -------------------------------------------------------------------------------- Wound Assessment Details Patient Name: Date of Service: Adam Petty. 05/20/2022 9:30 A M Medical Record Number: [id number] Patient Account Number: [id number] Date of Birth/Sex: Treating RN: 04-Jun-1960 (62 y.o. Roel Cluck  Primary Care Rayleigh Gillyard: Jason Coop, EMILY Other Clinician: Referring Adam Petty: Treating Adam Petty: Raynelle Fanning NKLIN, EMILY Weeks in Treatment: 1 Wound Status Wound Number: 1 Primary Etiology: Dehisced Wound Wound Location: Right, Midline Knee Wound Status: Open Wounding Event: Surgical Injury Comorbid History: Hepatitis C, Type II Diabetes, Osteoarthritis Date Acquired: 03/08/2022 Weeks Of Treatment: 1 Clustered Wound: No Photos Wound Measurements Length: (cm) 0.1 Width: (cm) 0.1 Depth: (cm) 0.1 Area: (cm) 0.008 Volume: (cm) 0.001 % Reduction in Area: 74.2% % Reduction in Volume: 66.7% Epithelialization: Medium (34-66%) Tunneling: No Undermining: No Wound Description Classification: Partial Thickness Foul Odor After Cleansing: No Slough/Fibrino No Electronic  Signature(s) Signed: 05/20/2022 1:06:24 PM By: Midge Aver MSN RN CNS [address] Elder Negus ([id number]) [id number] Page 8 of 8 Entered By: Midge Aver on 05/20/2022 06:48:35 -------------------------------------------------------------------------------- Vitals Details Patient Name: Date of Service: Adam Petty, Adam Petty 05/20/2022 9:30 A M Medical Record Number: [id number] Patient Account Number: [id number] Date of Birth/Sex: Treating RN: [birth year] (62 y.o. Roel Cluck Primary Care Yeimi Debnam: Jason Coop, EMILY Other Clinician: Betha Loa Referring Ricco Dershem: Treating Mabeline Varas/Extender: Raynelle Fanning NKLIN, EMILY Weeks in Treatment: 1 Vital Signs Time Taken: 09:40 Temperature (F): 98.1 Height (in): 66 Pulse (bpm): 82 Weight (lbs): 215 Respiratory Rate (breaths/min): 18 Body Mass Index (BMI): 34.7 Blood Pressure (mmHg): 142/78 Reference Range: 80 - 120 mg / dl Electronic Signature(s) Signed: 05/20/2022 1:06:24 PM By: Midge Aver MSN RN CNS WTA Entered By: Midge Aver on 05/20/2022 07:04:50

## 2022-06-03 ENCOUNTER — Ambulatory Visit: Payer: BC Managed Care – PPO | Admitting: Physician Assistant

## 2022-07-18 NOTE — Progress Notes (Unsigned)
PROVIDER NOTE: Information contained herein reflects review and annotations entered in association with encounter. Interpretation of such information and data should be left to medically-trained personnel. Information provided to patient can be located elsewhere in the medical record under "Patient Instructions". Document created using STT-dictation technology, any transcriptional errors that may result from process are unintentional.    Patient: Adam Petty.  Service Category: E/M  Provider: Oswaldo Done, MD  DOB: Mar 06, 1960  DOS: 07/20/2022  Referring Provider: No ref. provider found  MRN: [id number]  Specialty: Interventional Pain Management  PCP: [name], PA-C  Type: Established Patient  Setting: Ambulatory outpatient    Location: Office  Delivery: Face-to-face     HPI  Mr. Adam Bredehoft., a 62 y.o. year old male, is here today because of his No primary diagnosis found.. [name] primary complain today is No chief complaint on file.  Pertinent problems: 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); Discogenic syndrome, lumbar (WC injury); Osteoarthritis of spine with radiculopathy, lumbar region (WC injury); Lumbar facet syndrome (WC injury) (Bilateral) (R>L); Lumbosacral radiculopathy (WC injury); Lumbar canal stenosis; Neurogenic pain; Neuropathic pain; Musculoskeletal pain; Lumbar spondylosis; Chronic knee pain (2ry area of Pain) (WC injury) (Right); Osteoarthritis of knee (Right); Chronic shoulder pain (3ry area of Pain) (Right); Osteoarthritis of shoulder (Right); Chronic lower extremity pain (Right); Recurrent nephrolithiasis; Acute medial meniscal tear; Chronic pain syndrome (WC injury); Acute low back pain without sciatica; Osteoarthritis of glenohumeral joint (Right); Chronic sacroiliac joint pain  (Bilateral) (R>L); Spondylosis without myelopathy or radiculopathy, lumbosacral region; DDD (degenerative disc disease), lumbosacral; DDD (degenerative disc disease), thoracic; Lumbosacral radiculopathy (S1) (Right); Inflammatory spondylopathy of lumbosacral region [facility]); DDD (degenerative disc disease), cervical; Cervical facet syndrome; Cervicalgia; Cervicogenic headache; Elbow pain (Right); Greater trochanteric bursitis of right hip; Lateral epicondylitis of elbow (Right); Tennis elbow (Right); Chronic elbow pain (Right); Chronic forearm joint pain (Right); Osteoarthritis of elbow (Right); and S/P TKR (total knee replacement) using cement, right on their pertinent problem list. Pain Assessment: Severity of   is reported as a  /10. Location:    / . Onset:  . Quality:  . Timing:  . Modifying factor(s):  Marland Kitchen Vitals:  vitals were not taken for this visit.  BMI: Estimated body mass index is 36.32 kg/m as calculated from the following:   Height as of 04/25/22: 5\' 6"  (1.676 m).   Weight as of 04/25/22: 225 lb (102.1 kg). Last encounter: 04/25/2022. Last procedure: 11/02/2021.  Reason for encounter:  *** . ***  Pharmacotherapy Assessment  Analgesic: Oxycodone IR 5 mg, 1 tab PO q 6 hrs (20 mg/day)  MME/day: 30 mg/day.   Monitoring: [place] PMP: PDMP reviewed during this encounter.       Pharmacotherapy: No side-effects or adverse reactions reported. Compliance: No problems identified. Effectiveness: Clinically acceptable.  No notes on file  No results found for: "CBDTHCR" No results found for: "D8THCCBX" No results found for: "D9THCCBX"  UDS:  Summary  Date Value Ref Range Status  07/26/2021 Note  Final    Comment:    ==================================================================== [employer] [facility] [employer] (MW) ==================================================================== Test                             Result       Flag       Units  Drug Present and Declared for Prescription Verification    Oxycodone                      2655         EXPECTED   ng/mg creat   Oxymorphone                    1235         EXPECTED   ng/mg creat   Noroxycodone                   2413         EXPECTED   ng/mg creat   Noroxymorphone                 438          EXPECTED   ng/mg creat    Sources of oxycodone are scheduled prescription medications.    Oxymorphone, noroxycodone, and noroxymorphone are expected    metabolites of oxycodone. Oxymorphone is also available as a    scheduled prescription medication.  ==================================================================== Test                      Result    Flag   Units      Ref Range   Creatinine              280              mg/dL      >=16 ==================================================================== Declared Medications:  The flagging and interpretation on this report are based on the  following declared medications.  Unexpected results may arise from  inaccuracies in the declared medications.   **Note: The testing scope of this panel includes these medications:   Oxycodone (Percocet)   **Note: The testing scope of this panel does not include the  following reported medications:   Acetaminophen (Percocet)  Bisacodyl  Cholecalciferol  Docusate (Colace)  Gabapentin (Neurontin)  Meloxicam (Mobic)  Metformin (Glucophage)  Naproxen (Aleve)  Ramipril (Altace)  Rosuvastatin (Crestor)  Supplement (Fiber)  Vitamin B12 ==================================================================== For clinical consultation, please call [phone]. ====================================================================       ROS  Constitutional: Denies any fever or chills Gastrointestinal: No reported hemesis, hematochezia, vomiting, or acute GI distress Musculoskeletal: Denies any acute onset joint swelling, redness, loss of ROM, or weakness Neurological: No reported episodes of acute onset apraxia, aphasia, dysarthria, agnosia,  amnesia, paralysis, loss of coordination, or loss of consciousness  Medication Review  Cholecalciferol, Semaglutide(0.25 or 0.5MG /DOS), aspirin, celecoxib, cyanocobalamin, docusate sodium, gabapentin, ibuprofen, metFORMIN, methocarbamol, naloxone, naproxen sodium, oxyCODONE, oxyCODONE-acetaminophen, ramipril, and rosuvastatin  History Review  Allergy: [name] has No Known Allergies. Drug: [name]  reports no history of drug use. Alcohol:  reports current alcohol use. Tobacco:  reports that he has never smoked. He has quit using smokeless tobacco.  His smokeless tobacco use included chew. Social: [name]  reports that he has never smoked. He has quit using smokeless tobacco.  His smokeless tobacco use included chew. He reports current alcohol use. He reports that he does not use drugs. Medical:  has a past medical history of Arthritis, senescent (10/22/2014), Back pain, Chronic pain syndrome (10/22/2014), DJD (degenerative joint disease), Dyslipidemia, Essential hypertension, History of kidney stones, Hypercholesterolemia, IBS (irritable bowel syndrome), Lateral meniscus tear, Osteoarthritis, Preventative health care, Sleep apnea, Spinal stenosis, and Type 2 diabetes mellitus without complication (HCC). Surgical: Mr. Avino  has a past surgical history  that includes Tonsillectomy; Shoulder surgery (Right); Palate surgery; Cervical fusion (1995); Foot surgery; Lumbar laminectomy/decompression microdiscectomy (N/A, 02/06/2013); Knee arthroscopy (Right, 10/02/2013); Knee arthroscopy with medial menisectomy (Right, 03/25/2015); Knee bursectomy (Right, 03/25/2015); Cystoscopy w/ retrogrades (Right, 11/19/2015); Cystoscopy w/ retrogrades (Right, 10/03/2017); Ureteroscopy (Right, 10/03/2017); CTR (Left, 02/27/2018); Colonoscopy (01/24/2003); Colonoscopy (01/16/2018); Rotator cuff repair (Right); Rotator cuff repair (Left); Total knee arthroplasty (Right, 12/28/2021); and Irrigation and debridement  knee (Right, 03/08/2022). Family: family history includes COPD in his mother; Cancer in his mother; Diabetes in his father; Heart disease in his father and mother.  Laboratory Chemistry Profile   Renal Lab Results  Component Value Date   BUN 15 12/28/2021   CREATININE 0.90 12/28/2021   GFRAA >60 07/01/2019   GFRNONAA >60 12/16/2021    Hepatic Lab Results  Component Value Date   AST 21 12/27/2020   ALT 24 12/27/2020   ALBUMIN 3.8 12/27/2020   ALKPHOS 82 12/27/2020    Electrolytes Lab Results  Component Value Date   NA 139 12/28/2021   K 4.2 12/28/2021   CL 105 12/28/2021   CALCIUM 9.7 12/16/2021   MG 1.8 03/12/2015    Bone No results found for: "VD25OH", "VD125OH2TOT", "UJ8119JY7", "WG9562ZH0", "25OHVITD1", "25OHVITD2", "25OHVITD3", "TESTOFREE", "TESTOSTERONE"  Inflammation (CRP: Acute Phase) (ESR: Chronic Phase) Lab Results  Component Value Date   CRP 0.6 03/12/2015   ESRSEDRATE 9 03/12/2015         Note: Above Lab results reviewed.  Recent Imaging Review  CT HEAD WO CONTRAST ( ) CLINICAL DATA:  Severe headaches and dizziness.  EXAM: CT HEAD WITHOUT CONTRAST  TECHNIQUE: Contiguous axial images were obtained from the base of the skull through the vertex without intravenous contrast.  RADIATION DOSE REDUCTION: This exam was performed according to the departmental dose-optimization program which includes automated exposure control, adjustment of the mA and/or kV according to patient size and/or use of iterative reconstruction technique.  COMPARISON:  Head CT dated 12/27/2020.  FINDINGS: Brain: The ventricles and sulci are appropriate size for the patient's age. The gray-white matter discrimination is preserved. There is no acute intracranial hemorrhage. No mass effect or midline shift. No extra-axial fluid collection.  Vascular: No hyperdense vessel or unexpected calcification.  Skull: Normal. Negative for fracture or focal lesion.  Sinuses/Orbits: No  acute finding.  Other: None  IMPRESSION: Unremarkable noncontrast CT of the brain.  Electronically Signed   By: Elgie Collard M.D.   On: 05/04/2022 15:28 Note: Reviewed        Physical Exam  General appearance: Well nourished, well developed, and well hydrated. In no apparent acute distress Mental status: Alert, oriented x 3 (person, place, & time)       Respiratory: No evidence of acute respiratory distress Eyes: PERLA Vitals: There were no vitals taken for this visit. BMI: Estimated body mass index is 36.32 kg/m as calculated from the following:   Height as of 04/25/22: 5\' 6"  (1.676 m).   Weight as of 04/25/22: 225 lb (102.1 kg). Ideal: Patient weight not recorded  Assessment   Diagnosis Status  No diagnosis found. Controlled Controlled Controlled   Updated Problems: No problems updated.  Plan of Care  Problem-specific:  No problem-specific Assessment & Plan notes found for this encounter.  [name] has a current medication list which includes the following long-term medication(s): gabapentin, oxycodone, oxycodone, and oxycodone.  Pharmacotherapy (Medications Ordered): No orders of the defined types were placed in this encounter.  Orders:  No orders of the defined types were  placed in this encounter.  Follow-up plan:   No follow-ups on file.      Interventional Therapies  Risk Factors  Considerations:  MO (BMI>40)  T2IDDM  HTN  IBS  OSA     Planned  Pending:      Under consideration:   Diagnostic right knee genicular NB    Completed:   Diagnostic right lateral elbow steroid injection x1 (11/02/2021) (100/100/0/0)  Palliative right lumbar facet MBB x5 (05/20/2021) (80/80/80/80)  Palliative left lumbar facet MBB x2 (12/16/2015) (60/60/50/50)  Palliative right lumbar facet RFA x1  (Right: 04/13/2016) (100% relief) (100/100/100/100)  Diagnostic/therapeutic bilateral SI joint block x2 (11/17/2016) (NP-NR) Diagnostic right  Suprascapular NB x1 (06/22/2016) (0/15/50/50)    Completed by other providers:   Diagnostic right caudal ESI x1 + epidurogram x1 (10/02/2019) by Edward Jolly, MD  Surgery: Right total knee arthroplasty x1 (12/28/2021) by Dr. Meridee Score.  Surgery: L3-4 and L4-5 lumbar microdiscectomy/decompression (02/06/2013) by Dr. Pat Patrick    Therapeutic  Palliative (PRN) options:   Palliative lumbar facet MBB  Palliative right lumbar facet RFA   Diagnostic Suprascapular NB    Pharmacotherapy  Nonopioids transfer 11/04/2019: Neurontin        Recent Visits Date Type Provider Dept  04/25/22 Office Visit Delano Metz, MD [facility]  Showing recent visits within past 90 days and meeting all other requirements Future Appointments Date Type Provider Dept  07/20/22 Appointment Delano Metz, MD [facility]  Showing future appointments within next 90 days and meeting all other requirements  I discussed the assessment and treatment plan with the patient. The patient was provided an opportunity to ask questions and all were answered. The patient agreed with the plan and demonstrated an understanding of the instructions.  Patient advised to call back or seek an in-person evaluation if the symptoms or condition worsens.  Duration of encounter: *** minutes.  Total time on encounter, as per AMA guidelines included both the face-to-face and non-face-to-face time personally spent by the physician and/or other qualified health care professional(s) on the day of the encounter (includes time in activities that require the physician or other qualified health care professional and does not include time in activities normally performed by clinical staff). Physician's time may include the following activities when performed: Preparing to see the patient (e.g., pre-charting review of records, searching for previously ordered imaging, lab work, and nerve conduction tests) Review of prior  analgesic pharmacotherapies. Reviewing PMP Interpreting ordered tests (e.g., lab work, imaging, nerve conduction tests) Performing post-procedure evaluations, including interpretation of diagnostic procedures Obtaining and/or reviewing separately obtained history Performing a medically appropriate examination and/or evaluation Counseling and educating the patient/family/caregiver Ordering medications, tests, or procedures Referring and communicating with other health care professionals (when not separately reported) Documenting clinical information in the electronic or other health record Independently interpreting results (not separately reported) and communicating results to the patient/ family/caregiver Care coordination (not separately reported)  Note by: Oswaldo Done, MD Date: 07/20/2022; Time: 5:59 AM

## 2022-07-20 ENCOUNTER — Encounter: Payer: Self-pay | Admitting: Pain Medicine

## 2022-07-20 ENCOUNTER — Ambulatory Visit: Payer: BC Managed Care – PPO | Attending: Pain Medicine | Admitting: Pain Medicine

## 2022-07-20 VITALS — BP 133/77 | HR 87 | Temp 97.2°F | Ht 66.0 in | Wt 225.0 lb

## 2022-07-20 DIAGNOSIS — M25561 Pain in right knee: Secondary | ICD-10-CM | POA: Diagnosis not present

## 2022-07-20 DIAGNOSIS — M545 Low back pain, unspecified: Secondary | ICD-10-CM | POA: Diagnosis present

## 2022-07-20 DIAGNOSIS — M79604 Pain in right leg: Secondary | ICD-10-CM | POA: Diagnosis present

## 2022-07-20 DIAGNOSIS — M25511 Pain in right shoulder: Secondary | ICD-10-CM | POA: Insufficient documentation

## 2022-07-20 DIAGNOSIS — G894 Chronic pain syndrome: Secondary | ICD-10-CM | POA: Insufficient documentation

## 2022-07-20 DIAGNOSIS — Z79899 Other long term (current) drug therapy: Secondary | ICD-10-CM | POA: Insufficient documentation

## 2022-07-20 DIAGNOSIS — G8929 Other chronic pain: Secondary | ICD-10-CM | POA: Insufficient documentation

## 2022-07-20 DIAGNOSIS — M5126 Other intervertebral disc displacement, lumbar region: Secondary | ICD-10-CM | POA: Diagnosis not present

## 2022-07-20 DIAGNOSIS — M961 Postlaminectomy syndrome, not elsewhere classified: Secondary | ICD-10-CM | POA: Diagnosis present

## 2022-07-20 DIAGNOSIS — Z79891 Long term (current) use of opiate analgesic: Secondary | ICD-10-CM | POA: Insufficient documentation

## 2022-07-20 MED ORDER — OXYCODONE HCL 5 MG PO TABS: 5.0000 mg | ORAL_TABLET | Freq: Four times a day (QID) | ORAL | 0 refills | Status: DC | PRN

## 2022-07-20 NOTE — Patient Instructions (Signed)
____________________________________________________________________________________________  Opioid Pain Medication Update  To: All patients taking opioid pain medications. (I.e.: hydrocodone, hydromorphone, oxycodone, oxymorphone, morphine, codeine, methadone, tapentadol, tramadol, buprenorphine, fentanyl, etc.)  Re: Updated review of side effects and adverse reactions of opioid analgesics, as well as new information about long term effects of this class of medications.  Direct risks of long-term opioid therapy are not limited to opioid addiction and overdose. Potential medical risks include serious fractures, breathing problems during sleep, hyperalgesia, immunosuppression, chronic constipation, bowel obstruction, myocardial infarction, and tooth decay secondary to xerostomia.  Unpredictable adverse effects that can occur even if you take your medication correctly: Cognitive impairment, respiratory depression, and death. Most people think that if they take their medication "correctly", and "as instructed", that they will be safe. Nothing could be farther from the truth. In reality, a significant amount of recorded deaths associated with the use of opioids has occurred in individuals that had taken the medication for a long time, and were taking their medication correctly. The following are examples of how this can happen: Patient taking his/her medication for a long time, as instructed, without any side effects, is given a certain antibiotic or another unrelated medication, which in turn triggers a "Drug-to-drug interaction" leading to disorientation, cognitive impairment, impaired reflexes, respiratory depression or an untoward event leading to serious bodily harm or injury, including death.  Patient taking his/her medication for a long time, as instructed, without any side effects, develops an acute impairment of liver and/or kidney function. This will lead to a rapid inability of the body to  breakdown and eliminate their pain medication, which will result in effects similar to an "overdose", but with the same medicine and dose that they had always taken. This again may lead to disorientation, cognitive impairment, impaired reflexes, respiratory depression or an untoward event leading to serious bodily harm or injury, including death.  A similar problem will occur with patients as they grow older and their liver and kidney function begins to decrease as part of the aging process.  Background information: Historically, the original case for using long-term opioid therapy to treat chronic noncancer pain was based on safety assumptions that subsequent experience has called into question. In 1996, the American Pain Society and the American Academy of Pain Medicine issued a consensus statement supporting long-term opioid therapy. This statement acknowledged the dangers of opioid prescribing but concluded that the risk for addiction was low; respiratory depression induced by opioids was short-lived, occurred mainly in opioid-naive patients, and was antagonized by pain; tolerance was not a common problem; and efforts to control diversion should not constrain opioid prescribing. This has now proven to be wrong. Experience regarding the risks for opioid addiction, misuse, and overdose in community practice has failed to support these assumptions.  According to the Centers for Disease Control and Prevention, fatal overdoses involving opioid analgesics have increased sharply over the past decade. Currently, more than 96,700 people die from drug overdoses every year. Opioids are a factor in 7 out of every 10 overdose deaths. Deaths from drug overdose have surpassed motor vehicle accidents as the leading cause of death for individuals between the ages of 35 and 54.  Clinical data suggest that neuroendocrine dysfunction may be very common in both men and women, potentially causing hypogonadism, erectile  dysfunction, infertility, decreased libido, osteoporosis, and depression. Recent studies linked higher opioid dose to increased opioid-related mortality. Controlled observational studies reported that long-term opioid therapy may be associated with increased risk for cardiovascular events. Subsequent meta-analysis concluded   that the safety of long-term opioid therapy in elderly patients has not been proven.   Side Effects and adverse reactions: Common side effects: Drowsiness (sedation). Dizziness. Nausea and vomiting. Constipation. Physical dependence -- Dependence often manifests with withdrawal symptoms when opioids are discontinued or decreased. Tolerance -- As you take repeated doses of opioids, you require increased medication to experience the same effect of pain relief. Respiratory depression -- This can occur in healthy people, especially with higher doses. However, people with COPD, asthma or other lung conditions may be even more susceptible to fatal respiratory impairment.  Uncommon side effects: An increased sensitivity to feeling pain and extreme response to pain (hyperalgesia). Chronic use of opioids can lead to this. Delayed gastric emptying (the process by which the contents of your stomach are moved into your small intestine). Muscle rigidity. Immune system and hormonal dysfunction. Quick, involuntary muscle jerks (myoclonus). Arrhythmia. Itchy skin (pruritus). Dry mouth (xerostomia).  Long-term side effects: Chronic constipation. Sleep-disordered breathing (SDB). Increased risk of bone fractures. Hypothalamic-pituitary-adrenal dysregulation. Increased risk of overdose.  RISKS: Fractures and Falls:  Opioids increase the risk and incidence of falls. This is of particular importance in elderly patients.  Endocrine System:  Long-term administration is associated with endocrine abnormalities (endocrinopathies). (Also known as Opioid-induced Endocrinopathy) Influences  on both the hypothalamic-pituitary-adrenal axis?and the hypothalamic-pituitary-gonadal axis have been demonstrated with consequent hypogonadism and adrenal insufficiency in both sexes. Hypogonadism and decreased levels of dehydroepiandrosterone sulfate have been reported in men and women. Endocrine effects include: Amenorrhoea in women (abnormal absence of menstruation) Reduced libido in both sexes Decreased sexual function Erectile dysfunction in men Hypogonadisms (decreased testicular function with shrinkage of testicles) Infertility Depression and fatigue Loss of muscle mass Anxiety Depression Immune suppression Hyperalgesia Weight gain Anemia Osteoporosis Patients (particularly women of childbearing age) should avoid opioids. There is insufficient evidence to recommend routine monitoring of asymptomatic patients taking opioids in the long-term for hormonal deficiencies.  Immune System: Human studies have demonstrated that opioids have an immunomodulating effect. These effects are mediated via opioid receptors both on immune effector cells and in the central nervous system. Opioids have been demonstrated to have adverse effects on antimicrobial response and anti-tumour surveillance. Buprenorphine has been demonstrated to have no impact on immune function.  Opioid Induced Hyperalgesia: Human studies have demonstrated that prolonged use of opioids can lead to a state of abnormal pain sensitivity, sometimes called opioid induced hyperalgesia (OIH). Opioid induced hyperalgesia is not usually seen in the absence of tolerance to opioid analgesia. Clinically, hyperalgesia may be diagnosed if the patient on long-term opioid therapy presents with increased pain. This might be qualitatively and anatomically distinct from pain related to disease progression or to breakthrough pain resulting from development of opioid tolerance. Pain associated with hyperalgesia tends to be more diffuse than the  pre-existing pain and less defined in quality. Management of opioid induced hyperalgesia requires opioid dose reduction.  Cancer: Chronic opioid therapy has been associated with an increased risk of cancer among noncancer patients with chronic pain. This association was more evident in chronic strong opioid users. Chronic opioid consumption causes significant pathological changes in the small intestine and colon. Epidemiological studies have found that there is a link between opium dependence and initiation of gastrointestinal cancers. Cancer is the second leading cause of death after cardiovascular disease. Chronic use of opioids can cause multiple conditions such as GERD, immunosuppression and renal damage as well as carcinogenic effects, which are associated with the incidence of cancers.   Mortality: Long-term opioid use   has been associated with increased mortality among patients with chronic non-cancer pain (CNCP).  Prescription of long-acting opioids for chronic noncancer pain was associated with a significantly increased risk of all-cause mortality, including deaths from causes other than overdose.  Reference: Von Korff M, Kolodny A, Deyo RA, Chou R. Long-term opioid therapy reconsidered. Ann Intern Med. 2011 Sep 6;155(5):325-8. doi: 10.7326/[id number]. PMID: 21893626; PMCID: PMC3280085. Bedson J, Chen Y, Ashworth J, Hayward RA, Dunn KM, Jordan KP. Risk of adverse events in patients prescribed long-term opioids: A cohort study in the [place] Clinical Practice Research Datalink. Eur J Pain. 2019 May;23(5):908-922. doi: 10.1002/ejp.1357. Epub 2019 Jan 31. PMID: 30620116. Colameco S, Coren JS, Ciervo CA. Continuous opioid treatment for chronic noncancer pain: a time for moderation in prescribing. Postgrad Med. 2009 Jul;121(4):61-6. doi: 10.3810/pgm.2009.07.2032. PMID: 19641271. Chou R, Turner JA, Devine EB, Hansen RN, Sullivan SD, Blazina I, Dana T, Bougatsos C, Deyo RA. The  effectiveness and risks of long-term opioid therapy for chronic pain: a systematic review for a National Institutes of Health Pathways to Prevention Workshop. Ann Intern Med. 2015 Feb 17;162(4):276-86. doi: 10.7326/M14-2559. PMID: 25581257. Warner M, Chen LH, Makuc DM. NCHS Data Brief No. 22. [place]: Centers for Disease Control and Prevention; 2009. Sep, Increase in Fatal Poisonings Involving Opioid Analgesics in the [place], 1999-2006. Song IA, Choi HR, Oh TK. Long-term opioid use and mortality in patients with chronic non-cancer pain: Ten-year follow-up study in [place] from 2010 through 2019. EClinicalMedicine. 2022 Jul 18;51:101558. doi: 10.1016/j.eclinm.2022.101558. PMID: 35875817; PMCID: PMC9304910. Huser, W., Schubert, T., Vogelmann, T. et al. All-cause mortality in patients with long-term opioid therapy compared with non-opioid analgesics for chronic non-cancer pain: a database study. BMC Med 18, 162 (2020). https://doi.org/10.1186/[email or web address] Rashidian H, Zendehdel K, Kamangar F, Malekzadeh R, Haghdoost AA. An Ecological Study of the Association between Opiate Use and Incidence of Cancers. Addict Health. 2016 Fall;8(4):252-260. PMID: 28819556; PMCID: PMC5554805.  Our Goal: Our goal is to control your pain with means other than the use of opioid pain medications.  Our Recommendation: Talk to your physician about coming off of these medications. We can assist you with the tapering down and stopping these medicines. Based on the new information, even if you cannot completely stop the medication, a decrease in the dose may be associated with a lesser risk. Ask for other means of controlling the pain. Decrease or eliminate those factors that significantly contribute to your pain such as smoking, obesity, and a diet heavily tilted towards "inflammatory" nutrients.  Last Updated: 03/16/2022    ____________________________________________________________________________________________     ____________________________________________________________________________________________  Transfer of Pain Medication between Pharmacies  Re: 2023 DEA Clarification on existing regulation  Published on DEA Website: September 17, 2021  Title: Revised Regulation Allows DEA-Registered Pharmacies to Transfer Electronic Prescriptions at a Patient's Request DEA Headquarters Division - Public Information Office  "Patients now have the ability to request their electronic prescription be transferred to another pharmacy without having to go back to their practitioner to initiate the request. This revised regulation went into effect on Monday, September 13, 2021.     At a patient's request, a DEA-registered retail pharmacy can now transfer an electronic prescription for a controlled substance (schedules II-V) to another DEA-registered retail pharmacy. Prior to this change, patients would have to go through their practitioner to cancel their prescription and have it re-issued to a different pharmacy. The process was taxing and time consuming for both patients and practitioners.    The Drug Enforcement Administration (DEA) published its intent to revise the   process for transferring electronic prescriptions on December 06, 2019.  The final rule was published in the federal register on August 12, 2021 and went into effect 30 days later.  Under the final rule, a prescription can only be transferred once between pharmacies, and only if allowed under existing state or other applicable law. The prescription must remain in its electronic form; may not be altered in any way; and the transfer must be communicated directly between two licensed pharmacists. It's important to note, any authorized refills transfer with the original prescription, which means the entire prescription will be filled at the same pharmacy."     REFERENCES: 1. DEA website announcement https://www.dea.gov/stories/2023/2023-09/2021-09-01/revised-regulation-allows-dea-registered-pharmacies-transfer  2. Department of Justice website  https://www.govinfo.gov/content/pkg/FR-2021-08-12/pdf/2023-15847.pdf  3. DEPARTMENT OF JUSTICE Drug Enforcement Administration 21 CFR Part 1306 [Docket No. DEA-637] RIN [id number] "Transfer of Electronic Prescriptions for Schedules II-V Controlled Substances Between Pharmacies for Initial Filling"  ____________________________________________________________________________________________     _______________________________________________________________________  Medication Rules  Purpose: To inform patients, and their family members, of our medication rules and regulations.  Applies to: All patients receiving prescriptions from our practice (written or electronic).  Pharmacy of record: This is the pharmacy where your electronic prescriptions will be sent. Make sure we have the correct one.  Electronic prescriptions: In compliance with the [place] Strengthen Opioid Misuse Prevention (STOP) Act of 2017 (Session Law 2017-74/H243), effective January 17, 2018, all controlled substances must be electronically prescribed. Written prescriptions, faxing, or calling prescriptions to a pharmacy will no longer be done.  Prescription refills: These will be provided only during in-person appointments. No medications will be renewed without a "face-to-face" evaluation with your provider. Applies to all prescriptions.  NOTE: The following applies primarily to controlled substances (Opioid* Pain Medications).   Type of encounter (visit): For patients receiving controlled substances, face-to-face visits are required. (Not an option and not up to the patient.)  Patient's responsibilities: Pain Pills: Bring all pain pills to every appointment (except for procedure appointments). Pill Bottles: Bring  pills in original pharmacy bottle. Bring bottle, even if empty. Always bring the bottle of the most recent fill.  Medication refills: You are responsible for knowing and keeping track of what medications you are taking and when is it that you will need a refill. The day before your appointment: write a list of all prescriptions that need to be refilled. The day of the appointment: give the list to the admitting nurse. Prescriptions will be written only during appointments. No prescriptions will be written on procedure days. If you forget a medication: it will not be "Called in", "Faxed", or "electronically sent". You will need to get another appointment to get these prescribed. No early refills. Do not call asking to have your prescription filled early. Partial  or short prescriptions: Occasionally your pharmacy may not have enough pills to fill your prescription.  NEVER ACCEPT a partial fill or a prescription that is short of the total amount of pills that you were prescribed.  With controlled substances the law allows 72 hours for the pharmacy to complete the prescription.  If the prescription is not completed within 72 hours, the pharmacist will require a new prescription to be written. This means that you will be short on your medicine and we WILL NOT send another prescription to complete your original prescription.  Instead, request the pharmacy to send a carrier to a nearby branch to get enough medication to provide you with your full prescription. Prescription Accuracy: You are responsible for carefully inspecting your   prescriptions before leaving our office. Have the discharge nurse carefully go over each prescription with you, before taking them home. Make sure that your name is accurately spelled, that your address is correct. Check the name and dose of your medication to make sure it is accurate. Check the number of pills, and the written instructions to make sure they are clear and accurate. Make  sure that you are given enough medication to last until your next medication refill appointment. Taking Medication: Take medication as prescribed. When it comes to controlled substances, taking less pills or less frequently than prescribed is permitted and encouraged. Never take more pills than instructed. Never take the medication more frequently than prescribed.  Inform other Doctors: Always inform, all of your healthcare providers, of all the medications you take. Pain Medication from other Providers: You are not allowed to accept any additional pain medication from any other Doctor or Healthcare provider. There are two exceptions to this rule. (see below) In the event that you require additional pain medication, you are responsible for notifying us, as stated below. Cough Medicine: Often these contain an opioid, such as codeine or hydrocodone. Never accept or take cough medicine containing these opioids if you are already taking an opioid* medication. The combination may cause respiratory failure and death. Medication Agreement: You are responsible for carefully reading and following our Medication Agreement. This must be signed before receiving any prescriptions from our practice. Safely store a copy of your signed Agreement. Violations to the Agreement will result in no further prescriptions. (Additional copies of our Medication Agreement are available upon request.) Laws, Rules, & Regulations: All patients are expected to follow all Federal and State Laws, Statutes, Rules, & Regulations. Ignorance of the Laws does not constitute a valid excuse.  Illegal drugs and Controlled Substances: The use of illegal substances (including, but not limited to marijuana and its derivatives) and/or the illegal use of any controlled substances is strictly prohibited. Violation of this rule may result in the immediate and permanent discontinuation of any and all prescriptions being written by our practice. The use of  any illegal substances is prohibited. Adopted CDC guidelines & recommendations: Target dosing levels will be at or below 60 MME/day. Use of benzodiazepines** is not recommended.  Exceptions: There are only two exceptions to the rule of not receiving pain medications from other Healthcare Providers. Exception #1 (Emergencies): In the event of an emergency (i.e.: accident requiring emergency care), you are allowed to receive additional pain medication. However, you are responsible for: As soon as you are able, call our office [phone], at any time of the day or night, and leave a message stating your name, the date and nature of the emergency, and the name and dose of the medication prescribed. In the event that your call is answered by a member of our staff, make sure to document and save the date, time, and the name of the person that took your information.  Exception #2 (Planned Surgery): In the event that you are scheduled by another doctor or dentist to have any type of surgery or procedure, you are allowed (for a period no longer than 30 days), to receive additional pain medication, for the acute post-op pain. However, in this case, you are responsible for picking up a copy of our "Post-op Pain Management for Surgeons" handout, and giving it to your surgeon or dentist. This document is available at our office, and does not require an appointment to obtain it. Simply go to   our office during business hours (Monday-Thursday from 8:00 AM to 4:00 PM) (Friday 8:00 AM to 12:00 Noon) or if you have a scheduled appointment with us, prior to your surgery, and ask for it by name. In addition, you are responsible for: calling our office [phone], at any time of the day or night, and leaving a message stating your name, name of your surgeon, type of surgery, and date of procedure or surgery. Failure to comply with your responsibilities may result in termination of therapy involving the controlled  substances. Medication Agreement Violation. Following the above rules, including your responsibilities will help you in avoiding a Medication Agreement Violation ("Breaking your Pain Medication Contract").  Consequences:  Not following the above rules may result in permanent discontinuation of medication prescription therapy.  *Opioid medications include: morphine, codeine, oxycodone, oxymorphone, hydrocodone, hydromorphone, meperidine, tramadol, tapentadol, buprenorphine, fentanyl, methadone. **Benzodiazepine medications include: diazepam (Valium), alprazolam (Xanax), clonazepam (Klonopine), lorazepam (Ativan), clorazepate (Tranxene), chlordiazepoxide (Librium), estazolam (Prosom), oxazepam (Serax), temazepam (Restoril), triazolam (Halcion) (Last updated: 11/09/2021) ______________________________________________________________________    ______________________________________________________________________  Medication Recommendations and Reminders  Applies to: All patients receiving prescriptions (written and/or electronic).  Medication Rules & Regulations: You are responsible for reading, knowing, and following our "Medication Rules" document. These exist for your safety and that of others. They are not flexible and neither are we. Dismissing or ignoring them is an act of "non-compliance" that may result in complete and irreversible termination of such medication therapy. For safety reasons, "non-compliance" will not be tolerated. As with the [place] fundamental legal principle of "ignorance of the law is no defense", we will accept no excuses for not having read and knowing the content of documents provided to you by our practice.  Pharmacy of record:  Definition: This is the pharmacy where your electronic prescriptions will be sent.  We do not endorse any particular pharmacy. It is up to you and your insurance to decide what pharmacy to use.  We do not restrict you in your choice of  pharmacy. However, once we write for your prescriptions, we will NOT be re-sending more prescriptions to fix restricted supply problems created by your pharmacy, or your insurance.  The pharmacy listed in the electronic medical record should be the one where you want electronic prescriptions to be sent. If you choose to change pharmacy, simply notify our nursing staff. Changes will be made only during your regular appointments and not over the phone.  Recommendations: Keep all of your pain medications in a safe place, under lock and key, even if you live alone. We will NOT replace lost, stolen, or damaged medication. We do not accept "Police Reports" as proof of medications having been stolen. After you fill your prescription, take 1 week's worth of pills and put them away in a safe place. You should keep a separate, properly labeled bottle for this purpose. The remainder should be kept in the original bottle. Use this as your primary supply, until it runs out. Once it's gone, then you know that you have 1 week's worth of medicine, and it is time to come in for a prescription refill. If you do this correctly, it is unlikely that you will ever run out of medicine. To make sure that the above recommendation works, it is very important that you make sure your medication refill appointments are scheduled at least 1 week before you run out of medicine. To do this in an effective manner, make sure that you do not leave the office without   scheduling your next medication management appointment. Always ask the nursing staff to show you in your prescription , when your medication will be running out. Then arrange for the receptionist to get you a return appointment, at least 7 days before you run out of medicine. Do not wait until you have 1 or 2 pills left, to come in. This is very poor planning and does not take into consideration that we may need to cancel appointments due to bad weather, sickness, or emergencies  affecting our staff. DO NOT ACCEPT A "Partial Fill": If for any reason your pharmacy does not have enough pills/tablets to completely fill or refill your prescription, do not allow for a "partial fill". The law allows the pharmacy to complete that prescription within 72 hours, without requiring a new prescription. If they do not fill the rest of your prescription within those 72 hours, you will need a separate prescription to fill the remaining amount, which we will NOT provide. If the reason for the partial fill is your insurance, you will need to talk to the pharmacist about payment alternatives for the remaining tablets, but again, DO NOT ACCEPT A PARTIAL FILL, unless you can trust your pharmacist to obtain the remainder of the pills within 72 hours.  Prescription refills and/or changes in medication(s):  Prescription refills, and/or changes in dose or medication, will be conducted only during scheduled medication management appointments. (Applies to both, written and electronic prescriptions.) No refills on procedure days. No medication will be changed or started on procedure days. No changes, adjustments, and/or refills will be conducted on a procedure day. Doing so will interfere with the diagnostic portion of the procedure. No phone refills. No medications will be "called into the pharmacy". No Fax refills. No weekend refills. No Holliday refills. No after hours refills.  Remember:  Business hours are:  Monday to Thursday 8:00 AM to 4:00 PM Provider's Schedule: Kendle Erker, MD - Appointments are:  Medication management: Monday and Wednesday 8:00 AM to 4:00 PM Procedure day: Tuesday and Thursday 7:30 AM to 4:00 PM Bilal Lateef, MD - Appointments are:  Medication management: Tuesday and Thursday 8:00 AM to 4:00 PM Procedure day: Monday and Wednesday 7:30 AM to 4:00 PM (Last update: 11/09/2021) ______________________________________________________________________    ____________________________________________________________________________________________  Naloxone Nasal Spray  Why am I receiving this medication? [place] STOP ACT requires that all patients taking high dose opioids or at risk of opioids respiratory depression, be prescribed an opioid reversal agent, such as Naloxone (AKA: Narcan).  What is this medication? NALOXONE (nal OX one) treats opioid overdose, which causes slow or shallow breathing, severe drowsiness, or trouble staying awake. Call emergency services after using this medication. You may need additional treatment. Naloxone works by reversing the effects of opioids. It belongs to a group of medications called opioid blockers.  COMMON BRAND NAME(S): Kloxxado, Narcan  What should I tell my care team before I take this medication? They need to know if you have any of these conditions: Heart disease Substance use disorder An unusual or allergic reaction to naloxone, other medications, foods, dyes, or preservatives Pregnant or trying to get pregnant Breast-feeding  When to use this medication? This medication is to be used for the treatment of respiratory depression (less than 8 breaths per minute) secondary to opioid overdose.   How to use this medication? This medication is for use in the nose. Lay the person on their back. Support their neck with your hand and allow the head to tilt   back before giving the medication. The nasal spray should be given into 1 nostril. After giving the medication, move the person onto their side. Do not remove or test the nasal spray until ready to use. Get emergency medical help right away after giving the first dose of this medication, even if the person wakes up. You should be familiar with how to recognize the signs and symptoms of a narcotic overdose. If more doses are needed, give the additional dose in the other nostril. Talk to your care team about the use of this medication in children.  While this medication may be prescribed for children as young as newborns for selected conditions, precautions do apply.  Naloxone Overdosage: If you think you have taken too much of this medicine contact a poison control center or emergency room at once.  NOTE: This medicine is only for you. Do not share this medicine with others.  What if I miss a dose? This does not apply.  What may interact with this medication? This is only used during an emergency. No interactions are expected during emergency use. This list may not describe all possible interactions. Give your health care provider a list of all the medicines, herbs, non-prescription drugs, or dietary supplements you use. Also tell them if you smoke, drink alcohol, or use illegal drugs. Some items may interact with your medicine.  What should I watch for while using this medication? Keep this medication ready for use in the case of an opioid overdose. Make sure that you have the phone number of your care team and local hospital ready. You may need to have additional doses of this medication. Each nasal spray contains a single dose. Some emergencies may require additional doses. After use, bring the treated person to the nearest hospital or call 911. Make sure the treating care team knows that the person has received a dose of this medication. You will receive additional instructions on what to do during and after use of this medication before an emergency occurs.  What side effects may I notice from receiving this medication? Side effects that you should report to your care team as soon as possible: Allergic reactions--skin rash, itching, hives, swelling of the face, lips, tongue, or throat Side effects that usually do not require medical attention (report these to your care team if they continue or are bothersome): Constipation Dryness or irritation inside the nose Headache Increase in blood pressure Muscle spasms Stuffy  nose Toothache This list may not describe all possible side effects. Call your doctor for medical advice about side effects. You may report side effects to FDA at 1-800-FDA-1088.  Where should I keep my medication? Because this is an emergency medication, you should keep it with you at all times.  Keep out of the reach of children and pets. Store between 20 and 25 degrees C (68 and 77 degrees F). Do not freeze. Throw away any unused medication after the expiration date. Keep in original box until ready to use.  NOTE: This sheet is a summary. It may not cover all possible information. If you have questions about this medicine, talk to your doctor, pharmacist, or health care provider.   2023 Elsevier/Gold Standard (2020-09-11 00:00:00)  ____________________________________________________________________________________________   

## 2022-07-20 NOTE — Progress Notes (Signed)
Nursing Pain Medication Assessment:  Safety precautions to be maintained throughout the outpatient stay will include: orient to surroundings, keep bed in low position, maintain call bell within reach at all times, provide assistance with transfer out of bed and ambulation.  Medication Inspection Compliance: Pill count conducted under aseptic conditions, in front of the patient. Neither the pills nor the bottle was removed from the patient's sight at any time. Once count was completed pills were immediately returned to the patient in their original bottle.  Medication: See above Pill/Patch Count:  62 of 120 pills remain Pill/Patch Appearance: Markings consistent with prescribed medication Bottle Appearance: Standard pharmacy container. Clearly labeled. Filled Date: 6 / 17 / 2024 Last Medication intake:  TodaySafety precautions to be maintained throughout the outpatient stay will include: orient to surroundings, keep bed in low position, maintain call bell within reach at all times, provide assistance with transfer out of bed and ambulation.

## 2022-07-25 LAB — TOXASSURE SELECT 13 (MW), URINE

## 2022-07-27 ENCOUNTER — Other Ambulatory Visit: Payer: Self-pay

## 2022-07-27 ENCOUNTER — Telehealth: Payer: Self-pay

## 2022-07-27 ENCOUNTER — Emergency Department: Payer: BC Managed Care – PPO

## 2022-07-27 ENCOUNTER — Encounter: Payer: Self-pay | Admitting: Emergency Medicine

## 2022-07-27 ENCOUNTER — Emergency Department
Admission: EM | Admit: 2022-07-27 | Discharge: 2022-07-27 | Disposition: A | Discharge status: Home / Self Care | Payer: BC Managed Care – PPO | Attending: Emergency Medicine | Admitting: Emergency Medicine

## 2022-07-27 DIAGNOSIS — I10 Essential (primary) hypertension: Secondary | ICD-10-CM | POA: Diagnosis not present

## 2022-07-27 DIAGNOSIS — E119 Type 2 diabetes mellitus without complications: Secondary | ICD-10-CM | POA: Diagnosis not present

## 2022-07-27 DIAGNOSIS — N201 Calculus of ureter: Secondary | ICD-10-CM

## 2022-07-27 DIAGNOSIS — N132 Hydronephrosis with renal and ureteral calculous obstruction: Secondary | ICD-10-CM | POA: Insufficient documentation

## 2022-07-27 DIAGNOSIS — R109 Unspecified abdominal pain: Secondary | ICD-10-CM | POA: Diagnosis present

## 2022-07-27 DIAGNOSIS — N1330 Unspecified hydronephrosis: Secondary | ICD-10-CM

## 2022-07-27 LAB — URINALYSIS, ROUTINE W REFLEX MICROSCOPIC
Bilirubin Urine: NEGATIVE
Glucose, UA: NEGATIVE mg/dL
Ketones, ur: NEGATIVE mg/dL
Leukocytes,Ua: NEGATIVE
Nitrite: NEGATIVE
Protein, ur: NEGATIVE mg/dL
Specific Gravity, Urine: 1.024 (ref 1.005–1.030)
pH: 5 (ref 5.0–8.0)

## 2022-07-27 MED ORDER — CEPHALEXIN 500 MG PO CAPS: 500.0000 mg | ORAL_CAPSULE | Freq: Three times a day (TID) | ORAL | 0 refills | Status: AC

## 2022-07-27 MED ORDER — FENTANYL CITRATE PF 50 MCG/ML IJ SOSY
50.0000 ug | PREFILLED_SYRINGE | Freq: Once | INTRAMUSCULAR | Status: AC
  Administered 2022-07-27: 50 ug via INTRAVENOUS
  Filled 2022-07-27: qty 1

## 2022-07-27 MED ORDER — KETOROLAC TROMETHAMINE 30 MG/ML IJ SOLN
15.0000 mg | Freq: Once | INTRAMUSCULAR | Status: AC
  Administered 2022-07-27: 15 mg via INTRAVENOUS
  Filled 2022-07-27: qty 1

## 2022-07-27 MED ORDER — ONDANSETRON HCL 4 MG/2ML IJ SOLN
4.0000 mg | Freq: Once | INTRAMUSCULAR | Status: AC
  Administered 2022-07-27: 4 mg via INTRAVENOUS
  Filled 2022-07-27: qty 2

## 2022-07-27 MED ORDER — SODIUM CHLORIDE 0.9 % IV BOLUS
1000.0000 mL | Freq: Once | INTRAVENOUS | Status: AC
  Administered 2022-07-27: 1000 mL via INTRAVENOUS

## 2022-07-27 MED ORDER — OXYCODONE HCL 5 MG PO TABS: 5.0000 mg | ORAL_TABLET | Freq: Three times a day (TID) | ORAL | 0 refills | Status: DC | PRN

## 2022-07-27 NOTE — ED Triage Notes (Signed)
Pt via POV from home. Pt c/o L sided flank pain that started 1 week ago, pain started getting worse this AM. Pt has a hx of kidney stone. Pt is A&OX4 and NAD, ambulatory to triage.

## 2022-07-27 NOTE — Telephone Encounter (Signed)
He just left the ER with kidney stones and they gave you pain medicine. They gave him oxycodone, 5mg . He wants a nurse to call him back so he will know what nurse he turned it in to because the doctor told him it was his responsibility to notify his pain doctor.

## 2022-07-27 NOTE — ED Provider Notes (Signed)
[facility] Provider Note    Event Date/Time   First MD Initiated Contact with Patient 07/27/22 0930     (approximate)   History   Flank Pain   HPI  Adam Petty. is a 62 y.o. male with history of multiple areas of chronic orthopedic pain, hepatitis C, type 2 diabetes, hypertension and as listed in EMR presents to the emergency department for evaluation of left flank pain that started about a week ago.  Pain is worse this morning.  Patient self-reports a history of kidney stone.      Physical Exam   Triage Vital Signs: ED Triage Vitals  Enc Vitals Group     BP 07/27/22 0910 136/85     Pulse Rate 07/27/22 0910 89     Resp 07/27/22 0910 18     Temp 07/27/22 0910 99 F (37.2 C)     Temp src --      SpO2 07/27/22 0910 97 %     Weight 07/27/22 0908 220 lb (99.8 kg)     Height 07/27/22 0908 5\' 6"  (1.676 m)     Head Circumference --      Peak Flow --      Pain Score 07/27/22 0908 7     Pain Loc --      Pain Edu? --      Excl. in GC? --     Most recent vital signs: Vitals:   07/27/22 0910  BP: 136/85  Pulse: 89  Resp: 18  Temp: 99 F (37.2 C)  SpO2: 97%    General: Awake, no distress.  CV:  Good peripheral perfusion.  Resp:  Normal effort.  Abd:  No distention.  Other:  Right flank pain with CVA tenderness.   ED Results / Procedures / Treatments   Labs (all labs ordered are listed, but only abnormal results are displayed) Labs Reviewed  URINALYSIS, ROUTINE W REFLEX MICROSCOPIC - Abnormal; Notable for the following components:      Result Value   Color, Urine AMBER (*)    APPearance HAZY (*)    Hgb urine dipstick MODERATE (*)    Bacteria, UA RARE (*)    All other components within normal limits     EKG  Not indicated   RADIOLOGY  Image and radiology report reviewed and interpreted by me. Radiology report consistent with the same.  CT renal stone study consistent with moderate right hydroureteronephrosis  secondary to a 4 x 4 to 6 mm calculus in the mid right urinary with right perinephritic stranding  PROCEDURES:  Critical Care performed: No  Procedures   MEDICATIONS ORDERED IN ED:  Medications  sodium chloride 0.9 % bolus 1,000 mL (0 mLs Intravenous Stopped 07/27/22 1159)  ondansetron (ZOFRAN) injection 4 mg (4 mg Intravenous Given 07/27/22 1030)  fentaNYL (SUBLIMAZE) injection 50 mcg (50 mcg Intravenous Given 07/27/22 1030)  ketorolac (TORADOL) 30 MG/ML injection 15 mg (15 mg Intravenous Given 07/27/22 1117)  fentaNYL (SUBLIMAZE) injection 50 mcg (50 mcg Intravenous Given 07/27/22 1158)     IMPRESSION / MDM / ASSESSMENT AND PLAN / ED COURSE   I have reviewed the triage note.  Differential diagnosis includes, but is not limited to, pyelonephritis, infected stone, nephrolithiasis, ureterolithiasis, hydronephrosis  Patient's presentation is most consistent with acute presentation with potential threat to life or bodily function.  62 year old male presenting to the emergency department for treatment and evaluation of right-sided flank pain that is consistent with previous kidney stones.  Patient states that he has passed many kidney stones in the past and has not had to come to the emergency department, however this time pain is worse.  He has had some dysuria as well.  No hematuria noted.   Plan is to give some fluids, pain medications, nausea meds, and get a CT renal stone study.  Patient is aware and agreeable to this plan.  CT confirms ureterolithiasis with hydroureter nephrosis due to 4 x 4 x 6 mm stone in the right ureter.  He also has multiple nonobstructing stones in the bilateral kidneys.  No left hydronephrosis or ureteral calculus.  No other acute concerns in the abdomen.  Results discussed with the patient.  Pain had initially improved with medications and fluids however is returning.  Toradol ordered.  Patient continues to have pain after Toradol.  Additional dose of  fentanyl provided prior to discharge.  Patient states that he has Rapaflo at home and will continue to take that.  Discharge.  Patient states that he ha Rapaflo at home and will continue to take that.  He also is under pain contract but has had to have additional medications prescribed for kidney stone in the past and has not affected his agreement.  Short course of oxycodone will be prescribed however he was strongly encouraged to contact pain management provider prior to picking up the prescription to protect his contract.  He will also be prescribed 3 days of Keflex to hopefully prevent infection. ER return precautions discussed.      FINAL CLINICAL IMPRESSION(S) / ED DIAGNOSES   Final diagnoses:  Ureterolithiasis  Hydronephrosis, right     Rx / DC Orders   ED Discharge Orders          Ordered    oxyCODONE (ROXICODONE) 5 MG immediate release tablet  Every 8 hours PRN        07/27/22 1149    cephALEXin (KEFLEX) 500 MG capsule  3 times daily        07/27/22 1149             Note:  This document was prepared using Dragon voice recognition software and may include unintentional dictation errors.   Chinita Pester, FNP 07/27/22 1500    Jene Every, MD 07/27/22 [phone]

## 2022-07-27 NOTE — Telephone Encounter (Signed)
Patient informed ok to take Oxycodone for kidney stones. Reassured him that he did the right thing in notifying our office of this.

## 2022-08-01 ENCOUNTER — Telehealth: Payer: Self-pay | Admitting: Pain Medicine

## 2022-08-01 NOTE — Telephone Encounter (Signed)
Called pharmacy and OK'd script.

## 2022-08-01 NOTE — Telephone Encounter (Signed)
OK per Dr. Laban Emperor to fill the extra oxycodone patient received from ED for kidney stones. Will call pharmacy when they open to [facility] script.

## 2022-08-01 NOTE — Telephone Encounter (Signed)
Pharmacy called stated that patient was seen over the weekend at ER. Was giving an prescription for antibodies and extra oxycodone. PT was treating for kidney stones. Pharmacy wants to follow up with Dr. Laban Emperor to see if it's okay to fill these extra medications due to the fact that patient is under pain contact. Please give pharmacy a call. TY

## 2022-08-08 ENCOUNTER — Ambulatory Visit (INDEPENDENT_AMBULATORY_CARE_PROVIDER_SITE_OTHER): Payer: BC Managed Care – PPO | Admitting: Urology

## 2022-08-08 ENCOUNTER — Other Ambulatory Visit: Payer: Self-pay

## 2022-08-08 ENCOUNTER — Other Ambulatory Visit: Payer: Self-pay | Admitting: Urology

## 2022-08-08 ENCOUNTER — Encounter: Payer: Self-pay | Admitting: Urology

## 2022-08-08 ENCOUNTER — Telehealth: Payer: Self-pay

## 2022-08-08 VITALS — BP 123/81 | HR 88 | Wt 225.0 lb

## 2022-08-08 DIAGNOSIS — N1330 Unspecified hydronephrosis: Secondary | ICD-10-CM

## 2022-08-08 DIAGNOSIS — N23 Unspecified renal colic: Secondary | ICD-10-CM

## 2022-08-08 DIAGNOSIS — N201 Calculus of ureter: Secondary | ICD-10-CM

## 2022-08-08 DIAGNOSIS — N200 Calculus of kidney: Secondary | ICD-10-CM

## 2022-08-08 DIAGNOSIS — N132 Hydronephrosis with renal and ureteral calculous obstruction: Secondary | ICD-10-CM

## 2022-08-08 MED ORDER — KETOROLAC TROMETHAMINE 60 MG/2ML IM SOLN
60.0000 mg | Freq: Once | INTRAMUSCULAR | Status: AC
Start: 2022-08-08 — End: 2022-08-08
  Administered 2022-08-08: 60 mg via INTRAMUSCULAR

## 2022-08-08 NOTE — Progress Notes (Signed)
Surgical Physician Order Form [facility]  * Scheduling expectation : 08/09/2022  *Length of Case: 60 minutes  *Clearance needed: no  *Anticoagulation Instructions: N/A  *Aspirin Instructions: Ok to continue Aspirin  *Post-op visit Date/Instructions:  1 week cysto stent removal  *Diagnosis: Right Ureteral Stone  *Procedure: Right ureteroscopy; laser lithotripsy/stone removal, stent placement   Additional orders: N/A  -Admit type: OUTpatient  -Anesthesia: Choice  -VTE Prophylaxis Standing Order SCD's       Other:   -Standing Lab Orders Per Anesthesia    Lab other: None  -Standing Test orders EKG/Chest x-ray per Anesthesia       Test other:   - Medications:  Ancef 2gm IV  -Other orders:  N/A

## 2022-08-08 NOTE — Telephone Encounter (Signed)
I spoke with Adam Petty, Adam Petty. We have discussed possible surgery dates and July 22nd, 2024 was agreed upon by all parties. Patient given information about surgery date, what to expect pre-operatively and post operatively.  We discussed that a Pre-Admission Testing office will be calling to set up the pre-op visit that will take place prior to surgery, and that these appointments are typically done over the phone with a Pre-Admissions RN. Informed patient that our office will communicate any additional care to be provided after surgery. Patients questions or concerns were discussed during our call. Advised to call our office should there be any additional information, questions or concerns that arise. Patient verbalized understanding.

## 2022-08-08 NOTE — Progress Notes (Signed)
I, [name],acting as a scribe for Riki Altes, MD.,have documented all relevant documentation on the behalf of Riki Altes, MD,as directed by  Riki Altes, MD while in the presence of Riki Altes, MD.  08/08/2022 3:38 PM   Elder Negus Musa Rewerts. 1960-02-18 [id number]  Referring provider: [name], PA-C [address] [place],  [place] [address]  Chief Complaint  Patient presents with   Nephrolithiasis    HPI: Latrel Szymczak. is a 62 y.o. male who presents for evaluation of a right ureteral calculus with renal colic.     Presented to ED 07/27/2022 complaining of severe right flank pain, radiating to right testis. Pain similar to previously episodes of renal colic. Urinalysis showed 11-20 RBC and a CT renal stone study was remarkable for a 4 x 6 mm right mid ureteral calculus with moderate right hydronephrosis/hydroureter. His pain was controlled in the ED and he was discharged on oral Percocet, however he is followed in pain management and has a narcotic contract and there was difficulty getting this filled.  He has been taking Percocet with minimal pain improvement.  He complains of continued right flank pain.  He has some nausea without vomiting,  Denies fever or chills.  Prior history recurrent uric acid nephrolithiasis previously followed by Dr. [name]   PMH: Past Medical History:  Diagnosis Date   Arthritis, senescent 10/22/2014   Back pain    history spinal stenosis, DDD   Chronic pain syndrome 10/22/2014   DJD (degenerative joint disease)    Dyslipidemia    Essential hypertension    History of kidney stones    Hypercholesterolemia    IBS (irritable bowel syndrome)    Lateral meniscus tear    rt knee   Osteoarthritis    Preventative health care    takes crestor/ altace for "preventative reasons" denies heart or BP problems   Sleep apnea    had UPPP surgery and now sleep apnea resolved   Spinal stenosis    Type 2 diabetes  mellitus without complication [facility])     Surgical History: Past Surgical History:  Procedure Laterality Date   CERVICAL FUSION  1995   COLONOSCOPY  01/24/2003   COLONOSCOPY  01/16/2018   CTR Left 02/27/2018   CTR and trigger finger repair   CYSTOSCOPY W/ RETROGRADES Right 11/19/2015   Procedure: URETEROSCOPY WITH RETROGRADE PYELOGRAM;  Surgeon: Orson Ape, MD;  Location: [facility];  Service: Urology;  Laterality: Right;   CYSTOSCOPY W/ RETROGRADES Right 10/03/2017   Procedure: CYSTOSCOPY WITH RETROGRADE PYELOGRAM;  Surgeon: Orson Ape, MD;  Location: [facility];  Service: Urology;  Laterality: Right;   FOOT SURGERY     RT   IRRIGATION AND DEBRIDEMENT KNEE Right 03/08/2022   Procedure: IRRIGATION AND DEBRIDEMENT RIGHT KNEE;  Surgeon: Lyndle Herrlich, MD;  Location: [facility];  Service: Orthopedics;  Laterality: Right;   KNEE ARTHROSCOPY Right 10/02/2013   Procedure: RIGHT KNEE ARTHROSCOPY, PARTIAL LATERAL MENISCECTOMY, DEBRIDEMENT, MEDIAL AND LATERAL CHONDROPLASTY;  Surgeon: Loanne Drilling, MD;  Location: [facility];  Service: Orthopedics;  Laterality: Right;   KNEE ARTHROSCOPY WITH MEDIAL MENISECTOMY Right 03/25/2015   Procedure: RIGHT KNEE ARTHROSCOPY WITH MENISCAL DEBRIDEMENT and chrodroplasty;  Surgeon: Ollen Gross, MD;  Location: [facility];  Service: Orthopedics;  Laterality: Right;   KNEE BURSECTOMY Right 03/25/2015   Procedure: RIGHT KNEE PREPATELLA BURSECTOMY;  Surgeon: Ollen Gross, MD;  Location: [facility];  Service: Orthopedics;  Laterality: Right;  LUMBAR LAMINECTOMY/DECOMPRESSION MICRODISCECTOMY N/A 02/06/2013   Procedure: MICRO LUMBAR DECOMPRESSION L3-L4 and L4 - L5 ;  Surgeon: Javier Docker, MD;  Location: [facility];  Service: Orthopedics;  Laterality: N/A;   PALATE SURGERY     surg for sleep apnea and sinus issues   ROTATOR CUFF REPAIR Right    x3   ROTATOR CUFF REPAIR Left    SHOULDER SURGERY Right    spur removal x2   TONSILLECTOMY     TOTAL KNEE ARTHROPLASTY Right  12/28/2021   Procedure: TOTAL KNEE ARTHROPLASTY;  Surgeon: Lyndle Herrlich, MD;  Location: [facility];  Service: Orthopedics;  Laterality: Right;   URETEROSCOPY Right 10/03/2017   Procedure: URETEROSCOPY;  Surgeon: Orson Ape, MD;  Location: [facility];  Service: Urology;  Laterality: Right;    Home Medications:  Allergies as of 08/08/2022   No Known Allergies      Medication List        Accurate as of August 08, 2022  3:38 PM. If you have any questions, ask your nurse or doctor.          aspirin 81 MG chewable tablet Chew 1 tablet (81 mg total) by mouth 2 (two) times daily.   celecoxib 200 MG capsule Commonly known as: CELEBREX Take 200 mg by mouth daily.   Cholecalciferol 25 MCG (1000 UT) capsule Take 1,000 Units by mouth daily.   cyanocobalamin 1000 MCG/ML injection Commonly known as: VITAMIN B12 Inject into the muscle every 30 (thirty) days.   docusate sodium 100 MG capsule Commonly known as: COLACE Take 1 capsule (100 mg total) by mouth 2 (two) times daily.   gabapentin 600 MG tablet Commonly known as: NEURONTIN Take 1 tablet (600 mg total) by mouth every 6 (six) hours. What changed:  how much to take when to take this   ibuprofen 800 MG tablet Commonly known as: ADVIL Take 800 mg by mouth 3 (three) times daily.   metFORMIN 1000 MG tablet Commonly known as: GLUCOPHAGE TAKE 1 TABLET (1,000 MG TOTAL) BY MOUTH 2 (TWO) TIMES DAILY WITH MEALS.   methocarbamol 500 MG tablet Commonly known as: ROBAXIN Take 1 tablet (500 mg total) by mouth every 6 (six) hours as needed for muscle spasms.   naloxone 4 MG/0.1ML Liqd nasal spray kit Commonly known as: NARCAN Place 1 spray into the nose as needed for up to 365 doses (for opioid-induced respiratory depresssion). In case of emergency (overdose), spray once into each nostril. If no response within 3 minutes, repeat application and call 911.   naproxen sodium 220 MG tablet Commonly known as: ALEVE Take 440 mg by  mouth 2 (two) times daily as needed.   oxyCODONE 5 MG immediate release tablet Commonly known as: Roxicodone Take 1 tablet (5 mg total) by mouth every 8 (eight) hours as needed.   oxyCODONE 5 MG immediate release tablet Commonly known as: Oxy IR/ROXICODONE Take 1 tablet (5 mg total) by mouth every 6 (six) hours as needed for severe pain. Must last 30 days.   oxyCODONE 5 MG immediate release tablet Commonly known as: Oxy IR/ROXICODONE Take 1 tablet (5 mg total) by mouth every 6 (six) hours as needed for severe pain. Must last 30 days. Start taking on: September 02, 2022   oxyCODONE 5 MG immediate release tablet Commonly known as: Oxy IR/ROXICODONE Take 1 tablet (5 mg total) by mouth every 6 (six) hours as needed for severe pain. Must last 30 days. Start taking on: October 02, 2022  oxyCODONE-acetaminophen 5-325 MG tablet Commonly known as: PERCOCET/ROXICET Take 1 tablet by mouth every 6 (six) hours as needed for severe pain.   Ozempic (0.25 or 0.5 MG/DOSE) 2 MG/3ML Sopn Generic drug: Semaglutide(0.25 or 0.5MG /DOS) Inject 0.5 mg into the skin once a week. Sunday   ramipril 5 MG capsule Commonly known as: ALTACE Take 5 mg by mouth daily.   rosuvastatin 5 MG tablet Commonly known as: CRESTOR Take 5 mg by mouth 3 (three) times a week. Monday Wednesday Friday        Allergies: No Known Allergies  Family History: Family History  Problem Relation Age of Onset   Cancer Mother    Heart disease Mother    COPD Mother    Diabetes Father    Heart disease Father     Social History:  reports that he has never smoked. He has quit using smokeless tobacco.  His smokeless tobacco use included chew. He reports current alcohol use. He reports that he does not use drugs.   Physical Exam: BP 123/81   Pulse 88   Wt 225 lb (102.1 kg)   BMI 36.32 kg/m   Constitutional:  Alert and oriented, moderate distress secondary to renal colic. HEENT: [place] AT, moist mucus membranes.  Trachea  midline, no masses. Cardiovascular: No clubbing, cyanosis, or edema. Respiratory: Normal respiratory effort, no increased work of breathing. Psychiatric: Normal mood and affect.   Pertinent Imaging: CT imaging personally reviewed and interpreted.  CT Renal Stone Study  Narrative CLINICAL DATA:  Abdominal/flank pain, stone suspected.  EXAM: CT ABDOMEN AND PELVIS WITHOUT CONTRAST  TECHNIQUE: Multidetector CT imaging of the abdomen and pelvis was performed following the standard protocol without IV contrast.  RADIATION DOSE REDUCTION: This exam was performed according to the departmental dose-optimization program which includes automated exposure control, adjustment of the mA and/or kV according to patient size and/or use of iterative reconstruction technique.  COMPARISON:  CT examination dated February 16, 2012  FINDINGS: Lower chest: No acute abnormality.  Hepatobiliary: No focal liver abnormality is seen. No gallstones, gallbladder wall thickening, or biliary dilatation.  Pancreas: Unremarkable. No pancreatic ductal dilatation or surrounding inflammatory changes.  Spleen: Normal in size without focal abnormality.  Adrenals/Urinary Tract: Adrenal glands are unremarkable. Moderate right hydroureteronephrosis secondary to a 4 x 4 x 6 mm calculus in the mid right ureter. Right perinephric fat stranding. Multiple 2-3 mm calculi in bilateral kidneys. No left hydronephrosis or ureteral calculus. Bladder is unremarkable.  Stomach/Bowel: Stomach is within normal limits. Appendix not identified. No evidence of bowel wall thickening, distention, or inflammatory changes.  Vascular/Lymphatic: No significant vascular findings are present. No enlarged abdominal or pelvic lymph nodes.  Reproductive: Prostate is unremarkable.  Other: No abdominal wall hernia or abnormality. No abdominopelvic ascites.  Musculoskeletal: Multilevel degenerate disc disease of the lumbar spine  prominent at L5-S1. No acute osseous abnormality.  IMPRESSION: 1. Moderate right hydroureteronephrosis secondary to a 4 x 4 x 6 mm calculus in the mid right ureter. Right perinephric fat stranding. 2. Multiple 2-3 mm nonobstructing calculi in bilateral kidneys. No left hydronephrosis or ureteral calculus. 3. Bowel loops are normal in caliber. No evidence of colitis or diverticulitis. 4. Multilevel degenerate disc disease of the lumbar spine prominent at L5-S1.   Electronically Signed By: Larose Hires D.O. On: 07/27/2022 10:52   Assessment & Plan:    1. Right mid ureteral calculus We discussed various treatment options for urolithiasis including observation with or without medical expulsive therapy, shockwave lithotripsy (SWL), ureteroscopy  and laser lithotripsy with stent placement. We discussed that management is based on stone size, location, density, patient co-morbidities, and patient preference.  Stones <107mm in size have a >80% spontaneous passage rate. Data surrounding the use of tamsulosin for medical expulsive therapy is controversial, but meta analyses suggests it is most efficacious for distal stones between 5-50mm in size. Possible side effects include dizziness/lightheadedness, and retrograde ejaculation. SWL has a lower stone free rate in a single procedure, but also a lower complication rate compared to ureteroscopy and avoids a stent and associated stent related symptoms. Possible complications include renal hematoma, steinstrasse, and need for additional treatment. Ureteroscopy with laser lithotripsy and stent placement has a higher stone free rate than SWL in a single procedure, however increased complication rate including possible infection, ureteral injury, bleeding, and stent related morbidity. Common stent related symptoms include dysuria, urgency/frequency, and flank pain. After an extensive discussion of the risks and benefits of the above treatment options, the  patient would like to proceed with ureteroscopic stone removal and we'll add on to tomorrow's OR schedule 08/09/22.  He was given Toradol 30 mg IM in the office.  I have reviewed the above documentation for accuracy and completeness, and I agree with the above.   Riki Altes, MD  [facility] [address] [place], [place] [address] [phone]

## 2022-08-08 NOTE — H&P (View-Only) (Signed)
I, Adam Petty,acting as a scribe for Adam Altes, MD.,have documented all relevant documentation on the behalf of Adam Altes, MD,as directed by  Adam Altes, MD while in the presence of Adam Altes, MD.  08/08/2022 3:38 PM   Adam Petty. 23-Apr-1960 [id number]  Referring provider: [name], PA-C [address] ,  [place] [address]  Chief Complaint  Patient presents with   Nephrolithiasis    HPI: Adam Petty. is a 62 y.o. male who presents for evaluation of a right ureteral calculus with renal colic.     Presented to ED 07/27/2022 complaining of severe right flank pain, radiating to right testis. Pain similar to previously episodes of renal colic. Urinalysis showed 11-20 RBC and a CT renal stone study was remarkable for a 4 x 6 mm right mid ureteral calculus with moderate right hydronephrosis/hydroureter. His pain was controlled in the ED and he was discharged on oral Percocet, however he is followed in pain management and has a narcotic contract and there was difficulty getting this filled.  He has been taking Percocet with minimal pain improvement.  He complains of continued right flank pain.  He has some nausea without vomiting,  Denies fever or chills.  Prior history recurrent uric acid nephrolithiasis previously followed by Dr. Evelene Petty   PMH: Past Medical History:  Diagnosis Date   Arthritis, senescent 10/22/2014   Back pain    history spinal stenosis, DDD   Chronic pain syndrome 10/22/2014   DJD (degenerative joint disease)    Dyslipidemia    Essential hypertension    History of kidney stones    Hypercholesterolemia    IBS (irritable bowel syndrome)    Lateral meniscus tear    rt knee   Osteoarthritis    Preventative health care    takes crestor/ altace for "preventative reasons" denies heart or BP problems   Sleep apnea    had UPPP surgery and now sleep apnea resolved   Spinal stenosis    Type 2 diabetes  mellitus without complication [facility])     Surgical History: Past Surgical History:  Procedure Laterality Date   CERVICAL FUSION  1995   COLONOSCOPY  01/24/2003   COLONOSCOPY  01/16/2018   CTR Left 02/27/2018   CTR and trigger finger repair   CYSTOSCOPY W/ RETROGRADES Right 11/19/2015   Procedure: URETEROSCOPY WITH RETROGRADE PYELOGRAM;  Surgeon: Adam Ape, MD;  Location: [facility];  Service: Urology;  Laterality: Right;   CYSTOSCOPY W/ RETROGRADES Right 10/03/2017   Procedure: CYSTOSCOPY WITH RETROGRADE PYELOGRAM;  Surgeon: Adam Ape, MD;  Location: [facility];  Service: Urology;  Laterality: Right;   FOOT SURGERY     RT   IRRIGATION AND DEBRIDEMENT KNEE Right 03/08/2022   Procedure: IRRIGATION AND DEBRIDEMENT RIGHT KNEE;  Surgeon: Adam Herrlich, MD;  Location: [facility];  Service: Orthopedics;  Laterality: Right;   KNEE ARTHROSCOPY Right 10/02/2013   Procedure: RIGHT KNEE ARTHROSCOPY, PARTIAL LATERAL MENISCECTOMY, DEBRIDEMENT, MEDIAL AND LATERAL CHONDROPLASTY;  Surgeon: Adam Drilling, MD;  Location: [facility];  Service: Orthopedics;  Laterality: Right;   KNEE ARTHROSCOPY WITH MEDIAL MENISECTOMY Right 03/25/2015   Procedure: RIGHT KNEE ARTHROSCOPY WITH MENISCAL DEBRIDEMENT and chrodroplasty;  Surgeon: Adam Gross, MD;  Location: [facility];  Service: Orthopedics;  Laterality: Right;   KNEE BURSECTOMY Right 03/25/2015   Procedure: RIGHT KNEE PREPATELLA BURSECTOMY;  Surgeon: Adam Gross, MD;  Location: [facility];  Service: Orthopedics;  Laterality: Right;  LUMBAR LAMINECTOMY/DECOMPRESSION MICRODISCECTOMY N/A 02/06/2013   Procedure: MICRO LUMBAR DECOMPRESSION L3-L4 and L4 - L5 ;  Surgeon: Adam Docker, MD;  Location: [facility];  Service: Orthopedics;  Laterality: N/A;   PALATE SURGERY     surg for sleep apnea and sinus issues   ROTATOR CUFF REPAIR Right    x3   ROTATOR CUFF REPAIR Left    SHOULDER SURGERY Right    spur removal x2   TONSILLECTOMY     TOTAL KNEE ARTHROPLASTY Right  12/28/2021   Procedure: TOTAL KNEE ARTHROPLASTY;  Surgeon: Adam Herrlich, MD;  Location: [facility];  Service: Orthopedics;  Laterality: Right;   URETEROSCOPY Right 10/03/2017   Procedure: URETEROSCOPY;  Surgeon: Adam Ape, MD;  Location: [facility];  Service: Urology;  Laterality: Right;    Home Medications:  Allergies as of 08/08/2022   No Known Allergies      Medication List        Accurate as of August 08, 2022  3:38 PM. If you have any questions, ask your nurse or doctor.          aspirin 81 MG chewable tablet Chew 1 tablet (81 mg total) by mouth 2 (two) times daily.   celecoxib 200 MG capsule Commonly known as: CELEBREX Take 200 mg by mouth daily.   Cholecalciferol 25 MCG (1000 UT) capsule Take 1,000 Units by mouth daily.   cyanocobalamin 1000 MCG/ML injection Commonly known as: VITAMIN B12 Inject into the muscle every 30 (thirty) days.   docusate sodium 100 MG capsule Commonly known as: COLACE Take 1 capsule (100 mg total) by mouth 2 (two) times daily.   gabapentin 600 MG tablet Commonly known as: NEURONTIN Take 1 tablet (600 mg total) by mouth every 6 (six) hours. What changed:  how much to take when to take this   ibuprofen 800 MG tablet Commonly known as: ADVIL Take 800 mg by mouth 3 (three) times daily.   metFORMIN 1000 MG tablet Commonly known as: GLUCOPHAGE TAKE 1 TABLET (1,000 MG TOTAL) BY MOUTH 2 (TWO) TIMES DAILY WITH MEALS.   methocarbamol 500 MG tablet Commonly known as: ROBAXIN Take 1 tablet (500 mg total) by mouth every 6 (six) hours as needed for muscle spasms.   naloxone 4 MG/0.1ML Liqd nasal spray kit Commonly known as: NARCAN Place 1 spray into the nose as needed for up to 365 doses (for opioid-induced respiratory depresssion). In case of emergency (overdose), spray once into each nostril. If no response within 3 minutes, repeat application and call 911.   naproxen sodium 220 MG tablet Commonly known as: ALEVE Take 440 mg by  mouth 2 (two) times daily as needed.   oxyCODONE 5 MG immediate release tablet Commonly known as: Roxicodone Take 1 tablet (5 mg total) by mouth every 8 (eight) hours as needed.   oxyCODONE 5 MG immediate release tablet Commonly known as: Oxy IR/ROXICODONE Take 1 tablet (5 mg total) by mouth every 6 (six) hours as needed for severe pain. Must last 30 days.   oxyCODONE 5 MG immediate release tablet Commonly known as: Oxy IR/ROXICODONE Take 1 tablet (5 mg total) by mouth every 6 (six) hours as needed for severe pain. Must last 30 days. Start taking on: September 02, 2022   oxyCODONE 5 MG immediate release tablet Commonly known as: Oxy IR/ROXICODONE Take 1 tablet (5 mg total) by mouth every 6 (six) hours as needed for severe pain. Must last 30 days. Start taking on: October 02, 2022  oxyCODONE-acetaminophen 5-325 MG tablet Commonly known as: PERCOCET/ROXICET Take 1 tablet by mouth every 6 (six) hours as needed for severe pain.   Ozempic (0.25 or 0.5 MG/DOSE) 2 MG/3ML Sopn Generic drug: Semaglutide(0.25 or 0.5MG /DOS) Inject 0.5 mg into the skin once a week. Sunday   ramipril 5 MG capsule Commonly known as: ALTACE Take 5 mg by mouth daily.   rosuvastatin 5 MG tablet Commonly known as: CRESTOR Take 5 mg by mouth 3 (three) times a week. Monday Wednesday Friday        Allergies: No Known Allergies  Family History: Family History  Problem Relation Age of Onset   Cancer Mother    Heart disease Mother    COPD Mother    Diabetes Father    Heart disease Father     Social History:  reports that he has never smoked. He has quit using smokeless tobacco.  His smokeless tobacco use included chew. He reports current alcohol use. He reports that he does not use drugs.   Physical Exam: BP 123/81   Pulse 88   Wt 225 lb (102.1 kg)   BMI 36.32 kg/m   Constitutional:  Alert and oriented, moderate distress secondary to renal colic. HEENT: [place] AT, moist mucus membranes.  Trachea  midline, no masses. Cardiovascular: No clubbing, cyanosis, or edema. Respiratory: Normal respiratory effort, no increased work of breathing. Psychiatric: Normal mood and affect.   Pertinent Imaging: CT imaging personally reviewed and interpreted.  CT Renal Stone Study  Narrative CLINICAL DATA:  Abdominal/flank pain, stone suspected.  EXAM: CT ABDOMEN AND PELVIS WITHOUT CONTRAST  TECHNIQUE: Multidetector CT imaging of the abdomen and pelvis was performed following the standard protocol without IV contrast.  RADIATION DOSE REDUCTION: This exam was performed according to the departmental dose-optimization program which includes automated exposure control, adjustment of the mA and/or kV according to patient size and/or use of iterative reconstruction technique.  COMPARISON:  CT examination dated February 16, 2012  FINDINGS: Lower chest: No acute abnormality.  Hepatobiliary: No focal liver abnormality is seen. No gallstones, gallbladder wall thickening, or biliary dilatation.  Pancreas: Unremarkable. No pancreatic ductal dilatation or surrounding inflammatory changes.  Spleen: Normal in size without focal abnormality.  Adrenals/Urinary Tract: Adrenal glands are unremarkable. Moderate right hydroureteronephrosis secondary to a 4 x 4 x 6 mm calculus in the mid right ureter. Right perinephric fat stranding. Multiple 2-3 mm calculi in bilateral kidneys. No left hydronephrosis or ureteral calculus. Bladder is unremarkable.  Stomach/Bowel: Stomach is within normal limits. Appendix not identified. No evidence of bowel wall thickening, distention, or inflammatory changes.  Vascular/Lymphatic: No significant vascular findings are present. No enlarged abdominal or pelvic lymph nodes.  Reproductive: Prostate is unremarkable.  Other: No abdominal wall hernia or abnormality. No abdominopelvic ascites.  Musculoskeletal: Multilevel degenerate disc disease of the lumbar spine  prominent at L5-S1. No acute osseous abnormality.  IMPRESSION: 1. Moderate right hydroureteronephrosis secondary to a 4 x 4 x 6 mm calculus in the mid right ureter. Right perinephric fat stranding. 2. Multiple 2-3 mm nonobstructing calculi in bilateral kidneys. No left hydronephrosis or ureteral calculus. 3. Bowel loops are normal in caliber. No evidence of colitis or diverticulitis. 4. Multilevel degenerate disc disease of the lumbar spine prominent at L5-S1.   Electronically Signed By: Larose Hires D.O. On: 07/27/2022 10:52   Assessment & Plan:    1. Right mid ureteral calculus We discussed various treatment options for urolithiasis including observation with or without medical expulsive therapy, shockwave lithotripsy (SWL), ureteroscopy  and laser lithotripsy with stent placement. We discussed that management is based on stone size, location, density, patient co-morbidities, and patient preference.  Stones <43mm in size have a >80% spontaneous passage rate. Data surrounding the use of tamsulosin for medical expulsive therapy is controversial, but meta analyses suggests it is most efficacious for distal stones between 5-55mm in size. Possible side effects include dizziness/lightheadedness, and retrograde ejaculation. SWL has a lower stone free rate in a single procedure, but also a lower complication rate compared to ureteroscopy and avoids a stent and associated stent related symptoms. Possible complications include renal hematoma, steinstrasse, and need for additional treatment. Ureteroscopy with laser lithotripsy and stent placement has a higher stone free rate than SWL in a single procedure, however increased complication rate including possible infection, ureteral injury, bleeding, and stent related morbidity. Common stent related symptoms include dysuria, urgency/frequency, and flank pain. After an extensive discussion of the risks and benefits of the above treatment options, the  patient would like to proceed with ureteroscopic stone removal and we'll add on to tomorrow's OR schedule 08/09/22.  He was given Toradol 30 mg IM in the office.  I have reviewed the above documentation for accuracy and completeness, and I agree with the above.   Adam Altes, MD  [facility] [address], [address] [place], [place] [address] [phone]

## 2022-08-08 NOTE — Progress Notes (Signed)
   [facility] Posting Form  Surgery Date: Date: 08/08/2022  Surgeon: Dr. Irineo Axon, MD  Inpt ( No  )   Outpt (Yes)   Obs ( No  )   Diagnosis: Right Ureteral Stone N20.1  -CPT: [phone]  Surgery: Right Ureteroscopy with Laser Lithotripsy and Stent Placement   Stop Anticoagulations: No  Cardiac/Medical/Pulmonary Clearance needed: no  *Orders entered into EPIC  Date: 08/08/22   *Case booked in [place]  Date: 08/08/22  *Notified pt of Surgery: Date: 08/08/22  PRE-OP UA & CX: no  *Placed into Prior Authorization Work [place] Date: 08/08/22  Assistant/laser/rep:No

## 2022-08-09 ENCOUNTER — Ambulatory Visit: Payer: BC Managed Care – PPO

## 2022-08-09 ENCOUNTER — Other Ambulatory Visit: Payer: Self-pay

## 2022-08-09 ENCOUNTER — Encounter: Payer: Self-pay | Admitting: Urology

## 2022-08-09 ENCOUNTER — Ambulatory Visit
Admission: RE | Admit: 2022-08-09 | Discharge: 2022-08-09 | Disposition: A | Discharge status: Home / Self Care | Payer: BC Managed Care – PPO | Attending: Urology | Admitting: Urology

## 2022-08-09 ENCOUNTER — Encounter
Admission: RE | Disposition: A | Discharge status: Home / Self Care | Payer: Self-pay | Source: Home / Self Care | Attending: Urology

## 2022-08-09 DIAGNOSIS — N132 Hydronephrosis with renal and ureteral calculous obstruction: Secondary | ICD-10-CM | POA: Insufficient documentation

## 2022-08-09 DIAGNOSIS — N201 Calculus of ureter: Secondary | ICD-10-CM | POA: Diagnosis not present

## 2022-08-09 HISTORY — PX: CYSTOSCOPY/URETEROSCOPY/HOLMIUM LASER/STENT PLACEMENT: SHX6546

## 2022-08-09 LAB — GLUCOSE, CAPILLARY
Glucose-Capillary: 116 mg/dL — ABNORMAL HIGH (ref 70–99)
Glucose-Capillary: 111 mg/dL — ABNORMAL HIGH (ref 70–99)

## 2022-08-09 SURGERY — CYSTOSCOPY/URETEROSCOPY/HOLMIUM LASER/STENT PLACEMENT
Anesthesia: General | Site: Ureter | Laterality: Right

## 2022-08-09 MED ORDER — OXYBUTYNIN CHLORIDE 5 MG PO TABS
5.0000 mg | ORAL_TABLET | Freq: Once | ORAL | Status: AC
  Administered 2022-08-09: 5 mg via ORAL
  Filled 2022-08-09: qty 1

## 2022-08-09 MED ORDER — ROCURONIUM BROMIDE 10 MG/ML (PF) SYRINGE
PREFILLED_SYRINGE | INTRAVENOUS | Status: AC
  Filled 2022-08-09: qty 10

## 2022-08-09 MED ORDER — MIDAZOLAM HCL 2 MG/2ML IJ SOLN
INTRAMUSCULAR | Status: DC | PRN
  Administered 2022-08-09: 1 mg via INTRAVENOUS

## 2022-08-09 MED ORDER — ONDANSETRON HCL 4 MG/2ML IJ SOLN
INTRAMUSCULAR | Status: DC | PRN
Start: 2022-08-09 — End: 2022-08-09
  Administered 2022-08-09: 4 mg via INTRAVENOUS

## 2022-08-09 MED ORDER — OXYBUTYNIN CHLORIDE 5 MG PO TABS: ORAL_TABLET | ORAL | 0 refills | Status: DC

## 2022-08-09 MED ORDER — CHLORHEXIDINE GLUCONATE 0.12 % MT SOLN
OROMUCOSAL | Status: AC
  Filled 2022-08-09: qty 15

## 2022-08-09 MED ORDER — SODIUM CHLORIDE 0.9 % IR SOLN
Status: DC | PRN
  Administered 2022-08-09: 3000 mL

## 2022-08-09 MED ORDER — SODIUM CHLORIDE 0.9 % IV SOLN: INTRAVENOUS | Status: DC

## 2022-08-09 MED ORDER — OXYCODONE HCL 5 MG PO TABS
ORAL_TABLET | ORAL | Status: AC
  Filled 2022-08-09: qty 1

## 2022-08-09 MED ORDER — CEFAZOLIN SODIUM-DEXTROSE 2-4 GM/100ML-% IV SOLN
2.0000 g | INTRAVENOUS | Status: AC
  Administered 2022-08-09: 2 g via INTRAVENOUS

## 2022-08-09 MED ORDER — PHENYLEPHRINE HCL-NACL 20-0.9 MG/250ML-% IV SOLN
INTRAVENOUS | Status: DC | PRN
  Administered 2022-08-09: 50 ug/min via INTRAVENOUS

## 2022-08-09 MED ORDER — CEFAZOLIN SODIUM-DEXTROSE 2-4 GM/100ML-% IV SOLN
INTRAVENOUS | Status: AC
  Filled 2022-08-09: qty 100

## 2022-08-09 MED ORDER — IOHEXOL 180 MG/ML  SOLN
INTRAMUSCULAR | Status: DC | PRN
  Administered 2022-08-09 (×2): 10 mL

## 2022-08-09 MED ORDER — FENTANYL CITRATE (PF) 100 MCG/2ML IJ SOLN
INTRAMUSCULAR | Status: AC
  Filled 2022-08-09: qty 2

## 2022-08-09 MED ORDER — CHLORHEXIDINE GLUCONATE 0.12 % MT SOLN
15.0000 mL | Freq: Once | OROMUCOSAL | Status: AC
  Administered 2022-08-09: 15 mL via OROMUCOSAL

## 2022-08-09 MED ORDER — OXYCODONE HCL 5 MG/5ML PO SOLN: 5.0000 mg | Freq: Once | ORAL | Status: AC | PRN

## 2022-08-09 MED ORDER — TAMSULOSIN HCL 0.4 MG PO CAPS
ORAL_CAPSULE | ORAL | Status: AC
  Filled 2022-08-09: qty 1

## 2022-08-09 MED ORDER — ONDANSETRON HCL 4 MG/2ML IJ SOLN: 4.0000 mg | Freq: Once | INTRAMUSCULAR | Status: DC | PRN

## 2022-08-09 MED ORDER — TAMSULOSIN HCL 0.4 MG PO CAPS: 0.4000 mg | ORAL_CAPSULE | Freq: Every day | ORAL | 0 refills | Status: DC

## 2022-08-09 MED ORDER — SUCCINYLCHOLINE CHLORIDE 200 MG/10ML IV SOSY
PREFILLED_SYRINGE | INTRAVENOUS | Status: DC | PRN
  Administered 2022-08-09: 120 mg via INTRAVENOUS

## 2022-08-09 MED ORDER — MIDAZOLAM HCL 2 MG/2ML IJ SOLN
INTRAMUSCULAR | Status: AC
  Filled 2022-08-09: qty 2

## 2022-08-09 MED ORDER — OXYCODONE HCL 5 MG PO TABS
5.0000 mg | ORAL_TABLET | Freq: Once | ORAL | Status: AC | PRN
  Administered 2022-08-09: 5 mg via ORAL

## 2022-08-09 MED ORDER — FENTANYL CITRATE (PF) 100 MCG/2ML IJ SOLN
25.0000 ug | INTRAMUSCULAR | Status: AC | PRN
  Administered 2022-08-09 (×6): 25 ug via INTRAVENOUS

## 2022-08-09 MED ORDER — LIDOCAINE HCL (CARDIAC) PF 100 MG/5ML IV SOSY
PREFILLED_SYRINGE | INTRAVENOUS | Status: DC | PRN
  Administered 2022-08-09: 100 mg via INTRAVENOUS

## 2022-08-09 MED ORDER — FENTANYL CITRATE (PF) 100 MCG/2ML IJ SOLN
INTRAMUSCULAR | Status: DC | PRN
  Administered 2022-08-09: 100 ug via INTRAVENOUS

## 2022-08-09 MED ORDER — OXYBUTYNIN CHLORIDE 5 MG PO TABS
ORAL_TABLET | ORAL | Status: AC
  Filled 2022-08-09: qty 1

## 2022-08-09 MED ORDER — ACETAMINOPHEN 10 MG/ML IV SOLN
INTRAVENOUS | Status: AC
  Filled 2022-08-09: qty 100

## 2022-08-09 MED ORDER — ACETAMINOPHEN 10 MG/ML IV SOLN
INTRAVENOUS | Status: DC | PRN
  Administered 2022-08-09: 1000 mg via INTRAVENOUS

## 2022-08-09 MED ORDER — SUGAMMADEX SODIUM 200 MG/2ML IV SOLN
INTRAVENOUS | Status: DC | PRN
  Administered 2022-08-09: 200 mg via INTRAVENOUS
  Administered 2022-08-09: 50 mg via INTRAVENOUS

## 2022-08-09 MED ORDER — DEXAMETHASONE SODIUM PHOSPHATE 10 MG/ML IJ SOLN
INTRAMUSCULAR | Status: DC | PRN
  Administered 2022-08-09: 5 mg via INTRAVENOUS

## 2022-08-09 MED ORDER — PROPOFOL 10 MG/ML IV BOLUS
INTRAVENOUS | Status: DC | PRN
  Administered 2022-08-09: 170 mg via INTRAVENOUS

## 2022-08-09 MED ORDER — TAMSULOSIN HCL 0.4 MG PO CAPS
0.4000 mg | ORAL_CAPSULE | Freq: Every day | ORAL | Status: DC
  Administered 2022-08-09: 0.4 mg via ORAL

## 2022-08-09 MED ORDER — ROCURONIUM BROMIDE 100 MG/10ML IV SOLN
INTRAVENOUS | Status: DC | PRN
  Administered 2022-08-09 (×2): 10 mg via INTRAVENOUS
  Administered 2022-08-09: 20 mg via INTRAVENOUS

## 2022-08-09 SURGICAL SUPPLY — 22 items
ADH LQ OCL WTPRF AMP STRL LF (MISCELLANEOUS) ×1
ADHESIVE MASTISOL STRL (MISCELLANEOUS) IMPLANT
BAG DRAIN SIEMENS DORNER NS (MISCELLANEOUS) ×1 IMPLANT
BAG DRN NS LF (MISCELLANEOUS) ×1
BASKET ZERO TIP 1.9FR (BASKET) IMPLANT
BRUSH SCRUB EZ 1% IODOPHOR (MISCELLANEOUS) ×1 IMPLANT
BSKT STON RTRVL ZERO TP 1.9FR (BASKET) ×1
DRSG TEGADERM 2-3/8X2-3/4 SM (GAUZE/BANDAGES/DRESSINGS) IMPLANT
GLOVE BIOGEL PI IND STRL 7.5 (GLOVE) ×1 IMPLANT
GOWN STRL REUS W/ TWL LRG LVL3 (GOWN DISPOSABLE) ×1 IMPLANT
GOWN STRL REUS W/ TWL XL LVL3 (GOWN DISPOSABLE) ×1 IMPLANT
GOWN STRL REUS W/TWL LRG LVL3 (GOWN DISPOSABLE) ×1
GOWN STRL REUS W/TWL XL LVL3 (GOWN DISPOSABLE) ×1
GUIDEWIRE STR DUAL SENSOR (WIRE) ×1 IMPLANT
IV NS IRRIG 3000ML ARTHROMATIC (IV SOLUTION) ×1 IMPLANT
KIT TURNOVER CYSTO (KITS) ×1 IMPLANT
PACK CYSTO AR (MISCELLANEOUS) ×1 IMPLANT
SET CYSTO W/LG BORE CLAMP LF (SET/KITS/TRAYS/PACK) ×1 IMPLANT
STENT URET 6FRX24 CONTOUR (STENTS) IMPLANT
SURGILUBE 2OZ TUBE FLIPTOP (MISCELLANEOUS) ×1 IMPLANT
TRACTIP FLEXIVA PULSE ID 200 (Laser) IMPLANT
WATER STERILE IRR 500ML POUR (IV SOLUTION) ×1 IMPLANT

## 2022-08-09 NOTE — Anesthesia Procedure Notes (Signed)
Procedure Name: Intubation Date/Time: 08/09/2022 10:51 AM  Performed by: Lily Lovings, CRNAPre-anesthesia Checklist: Patient identified, Emergency Drugs available, Suction available and Patient being monitored Patient Re-evaluated:Patient Re-evaluated prior to induction Oxygen Delivery Method: Circle system utilized Preoxygenation: Pre-oxygenation with 100% oxygen Induction Type: IV induction and Rapid sequence Laryngoscope Size: McGraph and 4 Grade View: Grade I Tube type: Oral Tube size: 7.0 mm Number of attempts: 1 Airway Equipment and Method: Stylet Placement Confirmation: ETT inserted through vocal cords under direct vision, positive ETCO2 and breath sounds checked- equal and bilateral Secured at: 22 cm Tube secured with: Tape Dental Injury: Teeth and Oropharynx as per pre-operative assessment  Comments: Intubation by Allayne Butcher, SRNA

## 2022-08-09 NOTE — Anesthesia Postprocedure Evaluation (Signed)
Anesthesia Post Note  Patient: Adam Petty.  Procedure(s) Performed: CYSTOSCOPY/URETEROSCOPY/HOLMIUM LASER/STENT PLACEMENT (Right: Ureter)  Patient location during evaluation: PACU Anesthesia Type: General Level of consciousness: awake and alert Pain management: pain level controlled Vital Signs Assessment: post-procedure vital signs reviewed and stable Respiratory status: spontaneous breathing, nonlabored ventilation, respiratory function stable and patient connected to nasal cannula oxygen Cardiovascular status: blood pressure returned to baseline and stable Postop Assessment: no apparent nausea or vomiting Anesthetic complications: no   No notable events documented.   Last Vitals:  Vitals:   08/09/22 1308 08/09/22 1313  BP:    Pulse: 78 78  Resp: 15 14  Temp:    SpO2: 97% 95%    Last Pain:  Vitals:   08/09/22 1313  TempSrc:   PainSc: 7                  Corinda Gubler

## 2022-08-09 NOTE — Anesthesia Preprocedure Evaluation (Signed)
Anesthesia Evaluation  Patient identified by MRN, date of birth, ID band Patient awake    Reviewed: Allergy & Precautions, NPO status , Patient's Chart, lab work & pertinent test results  History of Anesthesia Complications Negative for: history of anesthetic complications  Airway Mallampati: III  TM Distance: >3 FB Neck ROM: Full    Dental no notable dental hx. (+) Teeth Intact   Pulmonary sleep apnea , neg COPD, Patient abstained from smoking.Not current smoker   Pulmonary exam normal breath sounds clear to auscultation       Cardiovascular Exercise Tolerance: Good METShypertension, Pt. on medications (-) CAD and (-) Past MI (-) dysrhythmias  Rhythm:Regular Rate:Normal - Systolic murmurs    Neuro/Psych  Headaches  negative psych ROS   GI/Hepatic ,neg GERD  ,,(+)     (-) substance abuse    Endo/Other  diabetes  On GLP1 ozempic, last taken two days ago. Patient endorses nausea for past 5 days, attributes it to kidney stone. He says he has not eaten for two days.  Renal/GU Renal disease     Musculoskeletal  (+) Arthritis ,    Abdominal  (+) + obese  Peds  Hematology   Anesthesia Other Findings Past Medical History: 10/22/2014: Arthritis, senescent No date: Back pain     Comment:  history spinal stenosis, DDD 10/22/2014: Chronic pain syndrome No date: DJD (degenerative joint disease) No date: Dyslipidemia No date: Essential hypertension No date: History of kidney stones No date: Hypercholesterolemia No date: IBS (irritable bowel syndrome) No date: Lateral meniscus tear     Comment:  rt knee No date: Osteoarthritis No date: Preventative health care     Comment:  takes crestor/ altace for "preventative reasons" denies               heart or BP problems No date: Sleep apnea     Comment:  had UPPP surgery and now sleep apnea resolved No date: Spinal stenosis No date: Type 2 diabetes mellitus without  complication (HCC)  Reproductive/Obstetrics                             Anesthesia Physical Anesthesia Plan  ASA: 3  Anesthesia Plan: General   Post-op Pain Management: Ofirmev IV (intra-op)*   Induction: Intravenous and Rapid sequence  PONV Risk Score and Plan: 3 and Ondansetron, Dexamethasone and Midazolam  Airway Management Planned: Oral ETT and Video Laryngoscope Planned  Additional Equipment: None  Intra-op Plan:   Post-operative Plan: Extubation in OR  Informed Consent: I have reviewed the patients History and Physical, chart, labs and discussed the procedure including the risks, benefits and alternatives for the proposed anesthesia with the patient or authorized representative who has indicated his/her understanding and acceptance.     Dental advisory given  Plan Discussed with: CRNA and Surgeon  Anesthesia Plan Comments: (Patient currently takes a GLP-1 agonist medication, last taken 2 days ago. Recent guidelines from American Society of Anesthesiologists recommend for cessation of weekly injectable medication for one week prior to elective procedure, and of daily medication for one day prior to elective procedure. If these guidelines have not been adhered to, a risk/benefit discussion should be had, and full stomach precautions should be assumed. I spoke with the patient and proceduralist about the r/b/a of proceeding with elective surgeries in these instances, and both understand and accept the risks of proceeding with full stomach precautions. Dr Lonna Cobb (urologist) believes waiting 5 more days for GLP1  washout presents more surgical risk compared with proceeding today.  Discussed risks of anesthesia with patient, including PONV, sore throat, lip/dental/eye damage, aspiration. Rare risks discussed as well, such as cardiorespiratory and neurological sequelae, and allergic reactions. Discussed the role of CRNA in patient's perioperative care. Patient  understands.)       Anesthesia Quick Evaluation

## 2022-08-09 NOTE — Discharge Instructions (Addendum)
DISCHARGE INSTRUCTIONS FOR KIDNEY STONE/URETERAL STENT   MEDICATIONS:  1. Resume all your other meds from home.  2.  AZO (over-the-counter) can help with the burning/stinging when you urinate. 3.  Tamsulosin and oxybutynin are for stent/bladder irritation, Rxs were sent to your pharmacy.  ACTIVITY:  1. May resume regular activities in 24 hours. 2. No driving while on narcotic pain medications  3. Drink plenty of water  4. Continue to walk at home - you can still get blood clots when you are at home, so keep active, but don't over do it.  5. May return to work/school tomorrow or when you feel ready   BATHING:  1. You can shower. 2. You have a string coming from your urethra: The stent string is attached to your ureteral stent. Do not pull on this.   SIGNS/SYMPTOMS TO CALL:  Common postoperative symptoms include urinary frequency, urgency, bladder spasm and blood in the urine  Please call us if you have a fever greater than 101.5, uncontrolled nausea/vomiting, uncontrolled pain, dizziness, unable to urinate, excessively bloody urine, chest pain, shortness of breath, leg swelling, leg pain, or any other concerns or questions.   You can reach [place] at [phone].   FOLLOW-UP:  1. You will be contacted for a follow-up appointment in approximately 1 month 2. You have a string attached to your stent, you may remove it on Thursday, 08/11/2022. To do this, pull the string until the stent is completely removed. You may feel an odd sensation in your back.   AMBULATORY SURGERY  DISCHARGE INSTRUCTIONS   The drugs that you were given will stay in your system until tomorrow so for the next 24 hours you should not:  Drive an automobile Make any legal decisions Drink any alcoholic beverage   You may resume regular meals tomorrow.  Today it is better to start with liquids and gradually work up to solid foods.  You may eat anything you prefer, but it is better to start with liquids, then soup  and crackers, and gradually work up to solid foods.   Please notify your doctor immediately if you have any unusual bleeding, trouble breathing, redness and pain at the surgery site, drainage, fever, or pain not relieved by medication.    Additional Instructions:   Please contact your physician with any problems or Same Day Surgery at [phone], Monday through Friday 6 am to 4 pm, or [place] at [facility] number at [phone].

## 2022-08-09 NOTE — Interval H&P Note (Signed)
History and Physical Interval Note:  Continues with poorly controlled right flank pain.  Scheduled for ureteroscopic stone removal.  He is on Ozempic with his last dose yesterday.  Due to the degree of his pain and poor pain control do not feel we will be able to defer treatment for 5 days.  Patient aware of the increased potential anesthetic complications with poor gastric emptying secondary to Ozempic and elects to proceed.  08/09/2022 10:33 AM  Steele Berg.  has presented today for surgery, with the diagnosis of Right Ureteral Stone.  The various methods of treatment have been discussed with the patient and family. After consideration of risks, benefits and other options for treatment, the patient has consented to  Procedure(s): CYSTOSCOPY/URETEROSCOPY/HOLMIUM LASER/STENT PLACEMENT (Right) as a surgical intervention.  The patient's history has been reviewed, patient examined, no change in status, stable for surgery.  I have reviewed the patient's chart and labs.  Questions were answered to the patient's satisfaction.     Davarion Cuffee C Sylvia Helms

## 2022-08-09 NOTE — Op Note (Signed)
   Preoperative diagnosis: Right mid ureteral calculus  Postoperative diagnosis: Right distal ureteral calculus  Procedure:  Cystoscopy Right ureteroscopy and stone removal Ureteroscopic laser lithotripsy Right ureteral stent placement (70F/24 cm)  Right retrograde pyelography with interpretation  Surgeon: Lorin Picket C. Calieb Lichtman, M.D.  Anesthesia: General  Complications: None  Intraoperative findings:  Cystoscopy-mild narrowing urethral meatus; no urethral strictures.  Prostate mild lateral lobe enlargement.  Bladder mucosa without erythema, solid or papillary lesions. Ureteroscopy- calculus distal ureter Right retrograde pyelography post procedure showed no filling defects, stone fragments or contrast extravasation  EBL: Minimal  Specimens: Calculus fragments for analysis   Indication: Adam Dec. is a 62 y.o. with right renal colic secondary to a 6 mm right mid ureteral calculus with poorly controlled pain on oral analgesics. After reviewing the management options for treatment, the patient elected to proceed with the above surgical procedure(s). We have discussed the potential benefits and risks of the procedure, side effects of the proposed treatment, the likelihood of the patient achieving the goals of the procedure, and any potential problems that might occur during the procedure or recuperation. Informed consent has been obtained.  Description of procedure:  The patient was taken to the operating room and general anesthesia was induced.  The patient was placed in the dorsal lithotomy position, prepped and draped in the usual sterile fashion, and preoperative antibiotics were administered. A preoperative time-out was performed.   A 21 French cystoscope was lubricated and unable to be advanced secondary to narrowing of the urethral meatus.  The distal urethra was dilated with Sissy Hoff sounds from 14-24 [place].  The cystoscope was then passed per urethra and advanced  proximally with findings as described above.    Attention was directed to the right ureteral orifice and a 0.038 Sensor wire was then advanced up the ureter into the renal pelvis under fluoroscopic guidance.  Initially some resistance was met in the distal ureter followed by brisk efflux of urine after guidewire passage into the renal pelvis.  A 4.5 Fr semirigid ureteroscope was then advanced into the ureter next to the guidewire and the calculus was identified.  The stone was then fragmented with a 243 m holmium laser fiber at a setting of 1.0 J/10 hz.   All fragments were then removed from the ureter with a zero tip nitinol basket.  Reinspection of the ureter revealed no remaining visible stones or fragments.   Retrograde pyelogram was performed with findings as described above.  A 6 FR/24 CM Contour ureteral stent with tether was placed under fluoroscopic guidance.  The wire was then removed with an adequate stent curl noted in the renal pelvis as well as in the bladder.  The bladder was then emptied and the procedure ended.  The patient appeared to tolerate the procedure well and without complications.  After anesthetic reversal the patient was transported to the PACU in stable condition.   Plan: Patient instructed to remove his stent on Thursday, 08/11/2022 1 month postop follow-up   Adam Axon, MD

## 2022-08-09 NOTE — Transfer of Care (Signed)
Immediate Anesthesia Transfer of Care Note  Patient: Adam Petty.  Procedure(s) Performed: CYSTOSCOPY/URETEROSCOPY/HOLMIUM LASER/STENT PLACEMENT (Right: Ureter)  Patient Location: PACU  Anesthesia Type:General  Level of Consciousness: awake, drowsy, and patient cooperative  Airway & Oxygen Therapy: Patient Spontanous Breathing and Patient connected to face mask oxygen  Post-op Assessment: Report given to RN and Post -op Vital signs reviewed and stable  Post vital signs: Reviewed and stable  Last Vitals:  Vitals Value Taken Time  BP 158/87 08/09/22 1215  Temp 97.7   Pulse 80 08/09/22 1216  Resp 30 08/09/22 1216  SpO2 100 % 08/09/22 1216  Vitals shown include unfiled device data.  Last Pain:  Vitals:   08/09/22 0935  TempSrc: Temporal  PainSc: 8          Complications: No notable events documented.

## 2022-08-09 NOTE — Progress Notes (Signed)
Patient held in PACU longer than expected. Arrival time is actually 1330

## 2022-08-10 ENCOUNTER — Encounter: Payer: Self-pay | Admitting: Urology

## 2022-08-11 ENCOUNTER — Telehealth: Payer: Self-pay

## 2022-08-11 ENCOUNTER — Emergency Department: Payer: BC Managed Care – PPO

## 2022-08-11 ENCOUNTER — Emergency Department
Admission: EM | Admit: 2022-08-11 | Discharge: 2022-08-11 | Disposition: A | Discharge status: Home / Self Care | Payer: BC Managed Care – PPO | Attending: Emergency Medicine | Admitting: Emergency Medicine

## 2022-08-11 DIAGNOSIS — R109 Unspecified abdominal pain: Secondary | ICD-10-CM | POA: Diagnosis present

## 2022-08-11 DIAGNOSIS — N132 Hydronephrosis with renal and ureteral calculous obstruction: Secondary | ICD-10-CM | POA: Insufficient documentation

## 2022-08-11 DIAGNOSIS — N201 Calculus of ureter: Secondary | ICD-10-CM

## 2022-08-11 LAB — URINALYSIS, ROUTINE W REFLEX MICROSCOPIC
Bilirubin Urine: NEGATIVE
Glucose, UA: NEGATIVE mg/dL
Ketones, ur: NEGATIVE mg/dL
Nitrite: NEGATIVE
Protein, ur: 30 mg/dL — AB
RBC / HPF: >50 RBC/hpf (ref 0–5)
Specific Gravity, Urine: 1.013 (ref 1.005–1.030)
Squamous Epithelial / HPF: NONE SEEN /HPF (ref 0–5)
pH: 5 (ref 5.0–8.0)

## 2022-08-11 LAB — CBC
HCT: 38.5 % — ABNORMAL LOW (ref 39.0–52.0)
Hemoglobin: 13 g/dL (ref 13.0–17.0)
MCH: 29.3 pg (ref 26.0–34.0)
MCHC: 33.8 g/dL (ref 30.0–36.0)
MCV: 86.7 fL (ref 80.0–100.0)
Platelets: 229 10*3/uL (ref 150–400)
RBC: 4.44 MIL/uL (ref 4.22–5.81)
RDW: 13.1 % (ref 11.5–15.5)
WBC: 10.5 10*3/uL (ref 4.0–10.5)
nRBC: 0 % (ref 0.0–0.2)

## 2022-08-11 LAB — BASIC METABOLIC PANEL
Anion gap: 9 (ref 5–15)
BUN: 30 mg/dL — ABNORMAL HIGH (ref 6–20)
CO2: 23 mmol/L (ref 22–32)
Calcium: 9 mg/dL (ref 8.9–10.3)
Chloride: 101 mmol/L (ref 98–111)
Creatinine, Ser: 1.4 mg/dL — ABNORMAL HIGH (ref 0.61–1.24)
GFR, Estimated: 58 mL/min — ABNORMAL LOW (ref >60)
Glucose, Bld: 102 mg/dL — ABNORMAL HIGH (ref 70–99)
Potassium: 4 mmol/L (ref 3.5–5.1)
Sodium: 133 mmol/L — ABNORMAL LOW (ref 135–145)

## 2022-08-11 MED ORDER — HYDROMORPHONE HCL 1 MG/ML IJ SOLN
1.0000 mg | INTRAMUSCULAR | Status: AC
  Administered 2022-08-11: 1 mg via INTRAVENOUS
  Filled 2022-08-11: qty 1

## 2022-08-11 MED ORDER — SODIUM CHLORIDE 0.9 % IV BOLUS
1000.0000 mL | Freq: Once | INTRAVENOUS | Status: AC
  Administered 2022-08-11: 1000 mL via INTRAVENOUS

## 2022-08-11 MED ORDER — KETOROLAC TROMETHAMINE 30 MG/ML IJ SOLN
30.0000 mg | Freq: Once | INTRAMUSCULAR | Status: AC
  Administered 2022-08-11: 30 mg via INTRAVENOUS
  Filled 2022-08-11: qty 1

## 2022-08-11 MED ORDER — CEPHALEXIN 500 MG PO CAPS: 500.0000 mg | ORAL_CAPSULE | Freq: Three times a day (TID) | ORAL | 0 refills | Status: AC

## 2022-08-11 MED ORDER — ONDANSETRON 4 MG PO TBDP: 4.0000 mg | ORAL_TABLET | Freq: Once | ORAL | Status: DC

## 2022-08-11 MED ORDER — OXYCODONE-ACETAMINOPHEN 10-325 MG PO TABS: 1.0000 | ORAL_TABLET | Freq: Three times a day (TID) | ORAL | 0 refills | Status: DC | PRN

## 2022-08-11 MED ORDER — ONDANSETRON HCL 4 MG/2ML IJ SOLN
4.0000 mg | Freq: Once | INTRAMUSCULAR | Status: AC
  Administered 2022-08-11: 4 mg via INTRAVENOUS
  Filled 2022-08-11: qty 2

## 2022-08-11 MED ORDER — HYDROMORPHONE BOLUS VIA INFUSION: 1.0000 mg | INTRAVENOUS | Status: DC

## 2022-08-11 MED ORDER — SODIUM CHLORIDE 0.9 % IV SOLN
1.0000 g | Freq: Once | INTRAVENOUS | Status: AC
  Administered 2022-08-11: 1 g via INTRAVENOUS
  Filled 2022-08-11: qty 10

## 2022-08-11 MED ORDER — HYDROMORPHONE HCL 1 MG/ML IJ SOLN: 1.0000 mg | Freq: Once | INTRAMUSCULAR | Status: DC

## 2022-08-11 NOTE — ED Provider Notes (Signed)
[facility] Provider Note    Event Date/Time   First MD Initiated Contact with Patient 08/11/22 1519     (approximate)   History   Flank Pain   HPI  Adam Petty. is a 62 y.o. male  with history of chronic pain and as listed in EMR presents to the emergency department for evaluation of right flank pain and pain in bilateral testicles.  He has a longstanding history of kidney stones and feels this is because of his pain today.  He had lithotripsy 2 days ago.  He had a stent that was removed this morning.  No relief with prescribed pain meds.      Physical Exam   Triage Vital Signs: ED Triage Vitals [08/11/22 1414]  Encounter Vitals Group     BP (!) 146/84     Systolic BP Percentile      Diastolic BP Percentile      Pulse Rate 72     Resp 18     Temp 98.1 F (36.7 C)     Temp Source Oral     SpO2 97 %     Weight 225 lb (102.1 kg)     Height 5\' 6"  (1.676 m)     Head Circumference      Peak Flow      Pain Score 9     Pain Loc      Pain Education      Exclude from Growth Chart     Most recent vital signs: Vitals:   08/11/22 1414 08/11/22 1630  BP: (!) 146/84 (!) 157/82  Pulse: 72 87  Resp: 18 18  Temp: 98.1 F (36.7 C)   SpO2: 97% 93%    General: Awake, no distress.  CV:  Good peripheral perfusion.  Resp:  Normal effort.  Abd:  No distention.  Other:  Right-sided CVA tenderness   ED Results / Procedures / Treatments   Labs (all labs ordered are listed, but only abnormal results are displayed) Labs Reviewed  URINALYSIS, ROUTINE W REFLEX MICROSCOPIC - Abnormal; Notable for the following components:      Result Value   Color, Urine YELLOW (*)    APPearance CLEAR (*)    Hgb urine dipstick LARGE (*)    Protein, ur 30 (*)    Leukocytes,Ua TRACE (*)    Bacteria, UA RARE (*)    All other components within normal limits  BASIC METABOLIC PANEL - Abnormal; Notable for the following components:   Sodium 133 (*)     Glucose, Bld 102 (*)    BUN 30 (*)    Creatinine, Ser 1.40 (*)    GFR, Estimated 58 (*)    All other components within normal limits  CBC - Abnormal; Notable for the following components:   HCT 38.5 (*)    All other components within normal limits     EKG  Not indicated   RADIOLOGY  Image and radiology report reviewed and interpreted by me. Radiology report consistent with the same.  CT for renal stone study shows small bilateral nonobstructing renal calculi and a small distal right ureteral stone with hydronephrosis and hydroureter  PROCEDURES:  Critical Care performed: No  Procedures   MEDICATIONS ORDERED IN ED:  Medications  sodium chloride 0.9 % bolus 1,000 mL (0 mLs Intravenous Stopped 08/11/22 1648)  ondansetron (ZOFRAN) injection 4 mg (4 mg Intravenous Given 08/11/22 1554)  HYDROmorphone (DILAUDID) injection 1 mg (1 mg Intravenous Given 08/11/22 1554)  ketorolac (TORADOL) 30 MG/ML injection 30 mg (30 mg Intravenous Given 08/11/22 1646)  cefTRIAXone (ROCEPHIN) 1 g in sodium chloride 0.9 % 100 mL IVPB (0 g Intravenous Stopped 08/11/22 1913)  HYDROmorphone (DILAUDID) injection 1 mg (1 mg Intravenous Given 08/11/22 1817)     IMPRESSION / MDM / ASSESSMENT AND PLAN / ED COURSE   I have reviewed the triage note.  Differential diagnosis includes, but is not limited to, nephrolithiasis, ureterolithiasis,  post procedure pain  Patient's presentation is most consistent with acute complicated illness / injury requiring diagnostic workup.  62 year old male presenting to the emergency department for treatment and evaluation of flank pain and testicle pain 2 days after having lithotripsy.  See HPI for further details.  KUB does not show any indication of stone.  Patient's pain is significant even after IV Dilaudid.  Plan will be to get a CT to rule out obstruction.  Toradol ordered as well.  Urinalysis shows large amount hemoglobin, trace leukocytes, 11-20 white blood cells and  rare bacteria.  IV Rocephin ordered.  CBC is without leukocytosis.  BMP shows BUN of 30 and a creatinine of 1.4.  CT does show a tiny stone in the right ureter with some hydronephrosis.  Patient significantly improving after medications.  He will be discharged home with antibiotic for 3 days and prescription for pain medication.  He was instructed to follow-up with urology as previously scheduled.        FINAL CLINICAL IMPRESSION(S) / ED DIAGNOSES   Final diagnoses:  Ureterolithiasis     Rx / DC Orders   ED Discharge Orders          Ordered    cephALEXin (KEFLEX) 500 MG capsule  3 times daily        08/11/22 1859    oxyCODONE-acetaminophen (PERCOCET) 10-325 MG tablet  Every 8 hours PRN        08/11/22 1859             Note:  This document was prepared using Dragon voice recognition software and may include unintentional dictation errors.   Chinita Pester, FNP 08/11/22 2353    Merwyn Katos, MD 08/19/22 [phone]

## 2022-08-11 NOTE — ED Triage Notes (Signed)
Pt presents to the ED POV from home with wife. Pt states that he feels like he has a kidney stone. Pt has a hx of kidney stones. Recently had lithotripsy on one stone and had a stent place. Stent was removed this morning. Pt reports bilateral flank pain that is predominately on the right side as well as pain in bilateral testicals. Pt states that this is how his kidney stones typically presents with kidney stones.

## 2022-08-11 NOTE — Telephone Encounter (Signed)
Pt called stating he is having back pain, described it as "knife in back" and "feeling like testicles are going to explode". Also having some nausea. Pt is 2 day post op Right Ureteroscopy with Laser Lithotripsy and Stent Placement. Per [facility] and [place], pt could come in for a Toradol injection or soak in warm water. Pt states he would like to come for the Toradol injection and a CT scan. Both PAs suggested pt to go to the ER for the CT since they didn't not recommend it. Pt states he will go to ER.

## 2022-08-12 ENCOUNTER — Telehealth: Payer: Self-pay | Admitting: Pain Medicine

## 2022-08-12 NOTE — Telephone Encounter (Signed)
Attempted to call patient.  Will call pharmacy to OK the filling of this prescription due to the acute nature of the kidney stone.  Patient was given Percocet 10/325 mg #12.  Unable to leave patient a message because the voicemail was full or had not been set up yet.

## 2022-08-12 NOTE — Telephone Encounter (Signed)
PT stated that he went to ER on last night for kidney stone. PT stated that he was getting a prescription for percocet by ER provider. PT wanted to report to nurse and have nurse give him a call. PT states that a nurse will need to call [facility] pharmacy to let them know it's okay ro fill prescription because patient is under contact. Please give patient a call.TY

## 2022-09-14 ENCOUNTER — Encounter: Payer: BC Managed Care – PPO | Admitting: Urology

## 2022-10-21 HISTORY — PX: HAND SURGERY: SHX662

## 2022-10-24 ENCOUNTER — Telehealth: Payer: Self-pay | Admitting: *Deleted

## 2022-10-24 NOTE — Telephone Encounter (Signed)
1610 Patient called to inform us that he received #10 oxycodone from his surgeon post hand surgery or post-op pain.

## 2022-10-24 NOTE — Progress Notes (Unsigned)
PROVIDER NOTE: Information contained herein reflects review and annotations entered in association with encounter. Interpretation of such information and data should be left to medically-trained personnel. Information provided to patient can be located elsewhere in the medical record under "Patient Instructions". Document created using STT-dictation technology, any transcriptional errors that may result from process are unintentional.    Patient: Adam Petty.  Service Category: E/M  Provider: Oswaldo Done, MD  DOB: Jul 08, 1960  DOS: 10/26/2022  Referring Provider: Harwell, Torian  MRN: [id number]  Specialty: Interventional Pain Management  PCP: [name], PA-C  Type: Established Patient  Setting: Ambulatory outpatient    Location: Office  Delivery: Face-to-face     HPI  [name], a 62 y.o. year old male, is here today because of his No primary diagnosis found.. Mr. Bolon primary complain today is No chief complaint on file.  Pertinent problems: Mr. 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); Discogenic syndrome, lumbar (WC injury); Osteoarthritis of spine with radiculopathy, lumbar region (WC injury); Lumbar facet syndrome (WC injury) (Bilateral) (R>L); Lumbosacral radiculopathy (WC injury); Lumbar canal stenosis; Neurogenic pain; Neuropathic pain; Musculoskeletal pain; Lumbar spondylosis; Chronic knee pain (2ry area of Pain) (WC injury) (Right); Osteoarthritis of knee (Right); Chronic shoulder pain (3ry area of Pain) (Right); Osteoarthritis of shoulder (Right); Chronic lower extremity pain (Right); Recurrent nephrolithiasis; Acute medial meniscal tear; Chronic pain syndrome (WC injury); Acute low back pain without sciatica; Osteoarthritis of glenohumeral joint (Right); Chronic sacroiliac joint pain  (Bilateral) (R>L); Spondylosis without myelopathy or radiculopathy, lumbosacral region; DDD (degenerative disc disease), lumbosacral; DDD (degenerative disc disease), thoracic; Lumbosacral radiculopathy (S1) (Right); Inflammatory spondylopathy of lumbosacral region [facility]); DDD (degenerative disc disease), cervical; Cervical facet syndrome; Cervicalgia; Cervicogenic headache; Elbow pain (Right); Greater trochanteric bursitis of right hip; Lateral epicondylitis of elbow (Right); Tennis elbow (Right); Chronic elbow pain (Right); Chronic forearm joint pain (Right); Osteoarthritis of elbow (Right); and S/P TKR (total knee replacement) using cement, right on their pertinent problem list. Pain Assessment: Severity of   is reported as a  /10. Location:    / . Onset:  . Quality:  . Timing:  . Modifying factor(s):  Marland Kitchen Vitals:  vitals were not taken for this visit.  BMI: Estimated body mass index is 36.32 kg/m as calculated from the following:   Height as of 08/11/22: 5\' 6"  (1.676 m).   Weight as of 08/11/22: 225 lb (102.1 kg). Last encounter: 07/20/2022. Last procedure: 11/02/2021.  Reason for encounter: medication management. ***  Pharmacotherapy Assessment  Analgesic: Oxycodone IR 5 mg, 1 tab PO q 6 hrs (20 mg/day)  MME/day: 30 mg/day.   Monitoring: [place] PMP: PDMP reviewed during this encounter.       Pharmacotherapy: No side-effects or adverse reactions reported. Compliance: No problems identified. Effectiveness: Clinically acceptable.  No notes on file  No results found for: "CBDTHCR" No results found for: "D8THCCBX" No results found for: "D9THCCBX"  UDS:  Summary  Date Value Ref Range Status  07/20/2022 Note  Final    Comment:    ==================================================================== [employer] [facility] [employer] (MW) ==================================================================== Test                             Result       Flag       Units  Drug  Present and Declared for  Prescription Verification   Oxycodone                      2010         EXPECTED   ng/mg creat   Oxymorphone                    1267         EXPECTED   ng/mg creat   Noroxycodone                   1646         EXPECTED   ng/mg creat   Noroxymorphone                 204          EXPECTED   ng/mg creat    Sources of oxycodone are scheduled prescription medications.    Oxymorphone, noroxycodone, and noroxymorphone are expected    metabolites of oxycodone. Oxymorphone is also available as a    scheduled prescription medication.  ==================================================================== Test                      Result    Flag   Units      Ref Range   Creatinine              167              mg/dL      >=16 ==================================================================== Declared Medications:  The flagging and interpretation on this report are based on the  following declared medications.  Unexpected results may arise from  inaccuracies in the declared medications.   **Note: The testing scope of this panel includes these medications:   Oxycodone  Oxycodone (Percocet)   **Note: The testing scope of this panel does not include the  following reported medications:   Acetaminophen (Percocet)  Aspirin  Celecoxib (Celebrex)  Cholecalciferol  Docusate  Gabapentin (Neurontin)  Ibuprofen (Advil)  Metformin  Methocarbamol  Naloxone (Narcan)  Naproxen (Aleve)  Ramipril (Altace)  Rosuvastatin (Crestor)  Semaglutide (Ozempic)  Vitamin B12 ==================================================================== For clinical consultation, please call [phone]. ====================================================================       ROS  Constitutional: Denies any fever or chills Gastrointestinal: No reported hemesis, hematochezia, vomiting, or acute GI distress Musculoskeletal: Denies any acute onset joint swelling, redness, loss of ROM, or weakness Neurological:  No reported episodes of acute onset apraxia, aphasia, dysarthria, agnosia, amnesia, paralysis, loss of coordination, or loss of consciousness  Medication Review  Cholecalciferol, Semaglutide(0.25 or 0.5MG /DOS), cyanocobalamin, docusate sodium, gabapentin, ibuprofen, metFORMIN, methocarbamol, naloxone, naproxen sodium, oxyCODONE, oxyCODONE-acetaminophen, oxybutynin, ramipril, rosuvastatin, and tamsulosin  History Review  Allergy: Mr. Ormond has No Known Allergies. Drug: Mr. Fleeger  reports no history of drug use. Alcohol:  reports current alcohol use. Tobacco:  reports that he has never smoked. He has quit using smokeless tobacco.  His smokeless tobacco use included chew. Social: Mr. Brockus  reports that he has never smoked. He has quit using smokeless tobacco.  His smokeless tobacco use included chew. He reports current alcohol use. He reports that he does not use drugs. Medical:  has a past medical history of Arthritis, senescent (10/22/2014), Back pain, Chronic pain syndrome (10/22/2014), DJD (degenerative joint disease), Dyslipidemia, Essential hypertension, History of kidney stones, Hypercholesterolemia, IBS (irritable bowel syndrome), Lateral meniscus tear, Osteoarthritis, Preventative health care, Sleep apnea, Spinal stenosis, and Type 2 diabetes mellitus without complication (HCC). Surgical: Mr.  Kunis  has a past surgical history that includes Tonsillectomy; Shoulder surgery (Right); Palate surgery; Cervical fusion (1995); Foot surgery; Lumbar laminectomy/decompression microdiscectomy (N/A, 02/06/2013); Knee arthroscopy (Right, 10/02/2013); Knee arthroscopy with medial menisectomy (Right, 03/25/2015); Knee bursectomy (Right, 03/25/2015); Cystoscopy w/ retrogrades (Right, 11/19/2015); Cystoscopy w/ retrogrades (Right, 10/03/2017); Ureteroscopy (Right, 10/03/2017); CTR (Left, 02/27/2018); Colonoscopy (01/24/2003); Colonoscopy (01/16/2018); Rotator cuff repair (Right); Rotator cuff repair  (Left); Total knee arthroplasty (Right, 12/28/2021); Irrigation and debridement knee (Right, 03/08/2022); and Cystoscopy/ureteroscopy/holmium laser/stent placement (Right, 08/09/2022). Family: family history includes COPD in his mother; Cancer in his mother; Diabetes in his father; Heart disease in his father and mother.  Laboratory Chemistry Profile   Renal Lab Results  Component Value Date   BUN 30 (H) 08/11/2022   CREATININE 1.40 (H) 08/11/2022   GFRAA >60 07/01/2019   GFRNONAA 58 (L) 08/11/2022    Hepatic Lab Results  Component Value Date   AST 21 12/27/2020   ALT 24 12/27/2020   ALBUMIN 3.8 12/27/2020   ALKPHOS 82 12/27/2020    Electrolytes Lab Results  Component Value Date   NA 133 (L) 08/11/2022   K 4.0 08/11/2022   CL 101 08/11/2022   CALCIUM 9.0 08/11/2022   MG 1.8 03/12/2015    Bone No results found for: "VD25OH", "VD125OH2TOT", "ZO1096EA5", "WU9811BJ4", "25OHVITD1", "25OHVITD2", "25OHVITD3", "TESTOFREE", "TESTOSTERONE"  Inflammation (CRP: Acute Phase) (ESR: Chronic Phase) Lab Results  Component Value Date   CRP 0.6 03/12/2015   ESRSEDRATE 9 03/12/2015         Note: Above Lab results reviewed.  Recent Imaging Review  CT Renal Stone Study CLINICAL DATA:  Flank pain, history of recent right-sided hydronephrosis  EXAM: CT ABDOMEN AND PELVIS WITHOUT CONTRAST  TECHNIQUE: Multidetector CT imaging of the abdomen and pelvis was performed following the standard protocol without IV contrast.  RADIATION DOSE REDUCTION: This exam was performed according to the departmental dose-optimization program which includes automated exposure control, adjustment of the mA and/or kV according to patient size and/or use of iterative reconstruction technique.  COMPARISON:  07/27/2022  FINDINGS: Lower chest: No acute abnormality.  Hepatobiliary: No focal liver abnormality is seen. No gallstones, gallbladder wall thickening, or biliary dilatation.  Pancreas: Unremarkable.  No pancreatic ductal dilatation or surrounding inflammatory changes.  Spleen: Normal in size without focal abnormality.  Adrenals/Urinary Tract: Adrenal glands are within normal limits. Left kidney shows multiple nonobstructing renal stones similar to that seen on the prior exam. No obstructive changes are seen. The bladder is which extends inferiorly to the level of the distal right ureter. A small 1-2 mm stone is noted in the distal right ureter causing the obstructive changes. Previously seen right mid ureteral stone has resolved following laser therapy a few small nonobstructing renal calculi are noted on the right.  Stomach/Bowel: No obstructive or inflammatory changes of the colon are seen. The appendix is not well visualized and may have been surgically removed. No inflammatory changes are seen. Small bowel and stomach are well visualized and within normal limits.  Vascular/Lymphatic: Aortic atherosclerosis. No enlarged abdominal or pelvic lymph nodes.  Reproductive: Prostate is unremarkable.  Other: No abdominal wall hernia or abnormality. No abdominopelvic ascites.  Musculoskeletal: Degenerative changes of lumbar spine are noted.  IMPRESSION: Tiny bilateral nonobstructing renal calculi.  Resolution of previously seen right mid ureteral stone. There is however a small distal right ureteral stone at this time causing hydronephrosis and hydroureter.  Electronically Signed   By: Alcide Clever M.D.   On: 08/11/2022 18:19 DG Abdomen 1  View CLINICAL DATA:  Pain after lithotripsy.  EXAM: ABDOMEN - 1 VIEW  COMPARISON:  Renal stone CT 07/27/2022  FINDINGS: Patient's known punctate renal calculi are not well seen on this study likely related to overlying bowel gas. Previously identified ureteral calculus on the right is not definitely seen. Bowel-gas pattern is nonobstructive. No acute fractures are seen.  IMPRESSION: Patient's known punctate renal calculi are not  well seen on this study likely related to overlying bowel gas.  Electronically Signed   By: Darliss Cheney M.D.   On: 08/11/2022 17:28 Note: Reviewed        Physical Exam  General appearance: Well nourished, well developed, and well hydrated. In no apparent acute distress Mental status: Alert, oriented x 3 (person, place, & time)       Respiratory: No evidence of acute respiratory distress Eyes: PERLA Vitals: There were no vitals taken for this visit. BMI: Estimated body mass index is 36.32 kg/m as calculated from the following:   Height as of 08/11/22: 5\' 6"  (1.676 m).   Weight as of 08/11/22: 225 lb (102.1 kg). Ideal: Patient weight not recorded  Assessment   Diagnosis Status  No diagnosis found. Controlled Controlled Controlled   Updated Problems: No problems updated.  Plan of Care  Problem-specific:  No problem-specific Assessment & Plan notes found for this encounter.  Mr. Varner Cancilla. has a current medication list which includes the following long-term medication(s): gabapentin, oxycodone, oxycodone, oxycodone, and oxycodone.  Pharmacotherapy (Medications Ordered): No orders of the defined types were placed in this encounter.  Orders:  No orders of the defined types were placed in this encounter.  Follow-up plan:   No follow-ups on file.      Interventional Therapies  Risk Factors  Considerations:  MO (BMI>40)  T2IDDM  HTN  IBS  OSA     Planned  Pending:      Under consideration:   Diagnostic right knee genicular NB    Completed:   Diagnostic right lateral elbow steroid injection x1 (11/02/2021) (100/100/0/0)  Palliative right lumbar facet MBB x5 (05/20/2021) (80/80/80/80)  Palliative left lumbar facet MBB x2 (12/16/2015) (60/60/50/50)  Palliative right lumbar facet RFA x1  (Right: 04/13/2016) (100% relief) (100/100/100/100)  Diagnostic/therapeutic bilateral SI joint block x2 (11/17/2016) (NP-NR) Diagnostic right Suprascapular NB x1  (06/22/2016) (0/15/50/50)    Completed by other providers:   Diagnostic right caudal ESI x1 + epidurogram x1 (10/02/2019) by Edward Jolly, MD  Surgery: Right total knee arthroplasty x1 (12/28/2021) by Dr. Meridee Score.  Surgery: L3-4 and L4-5 lumbar microdiscectomy/decompression (02/06/2013) by Dr. Pat Patrick    Therapeutic  Palliative (PRN) options:   Palliative lumbar facet MBB  Palliative right lumbar facet RFA   Diagnostic Suprascapular NB    Pharmacotherapy  Nonopioids transfer 11/04/2019: Neurontin         Recent Visits No visits were found meeting these conditions. Showing recent visits within past 90 days and meeting all other requirements Future Appointments Date Type Provider Dept  10/26/22 Appointment Delano Metz, MD [facility]  Showing future appointments within next 90 days and meeting all other requirements  I discussed the assessment and treatment plan with the patient. The patient was provided an opportunity to ask questions and all were answered. The patient agreed with the plan and demonstrated an understanding of the instructions.  Patient advised to call back or seek an in-person evaluation if the symptoms or condition worsens.  Duration of encounter: *** minutes.  Total time on  encounter, as per AMA guidelines included both the face-to-face and non-face-to-face time personally spent by the physician and/or other qualified health care professional(s) on the day of the encounter (includes time in activities that require the physician or other qualified health care professional and does not include time in activities normally performed by clinical staff). Physician's time may include the following activities when performed: Preparing to see the patient (e.g., pre-charting review of records, searching for previously ordered imaging, lab work, and nerve conduction tests) Review of prior analgesic pharmacotherapies. Reviewing PMP Interpreting ordered  tests (e.g., lab work, imaging, nerve conduction tests) Performing post-procedure evaluations, including interpretation of diagnostic procedures Obtaining and/or reviewing separately obtained history Performing a medically appropriate examination and/or evaluation Counseling and educating the patient/family/caregiver Ordering medications, tests, or procedures Referring and communicating with other health care professionals (when not separately reported) Documenting clinical information in the electronic or other health record Independently interpreting results (not separately reported) and communicating results to the patient/ family/caregiver Care coordination (not separately reported)  Note by: Oswaldo Done, MD Date: 10/26/2022; Time: 1:04 PM

## 2022-10-25 NOTE — Patient Instructions (Signed)
____________________________________________________________________________________________  Opioid Pain Medication Update  To: All patients taking opioid pain medications. (I.e.: hydrocodone, hydromorphone, oxycodone, oxymorphone, morphine, codeine, methadone, tapentadol, tramadol, buprenorphine, fentanyl, etc.)  Re: Updated review of side effects and adverse reactions of opioid analgesics, as well as new information about long term effects of this class of medications.  Direct risks of long-term opioid therapy are not limited to opioid addiction and overdose. Potential medical risks include serious fractures, breathing problems during sleep, hyperalgesia, immunosuppression, chronic constipation, bowel obstruction, myocardial infarction, and tooth decay secondary to xerostomia.  Unpredictable adverse effects that can occur even if you take your medication correctly: Cognitive impairment, respiratory depression, and death. Most people think that if they take their medication "correctly", and "as instructed", that they will be safe. Nothing could be farther from the truth. In reality, a significant amount of recorded deaths associated with the use of opioids has occurred in individuals that had taken the medication for a long time, and were taking their medication correctly. The following are examples of how this can happen: Patient taking his/her medication for a long time, as instructed, without any side effects, is given a certain antibiotic or another unrelated medication, which in turn triggers a "Drug-to-drug interaction" leading to disorientation, cognitive impairment, impaired reflexes, respiratory depression or an untoward event leading to serious bodily harm or injury, including death.  Patient taking his/her medication for a long time, as instructed, without any side effects, develops an acute impairment of liver and/or kidney function. This will lead to a rapid inability of the body to  breakdown and eliminate their pain medication, which will result in effects similar to an "overdose", but with the same medicine and dose that they had always taken. This again may lead to disorientation, cognitive impairment, impaired reflexes, respiratory depression or an untoward event leading to serious bodily harm or injury, including death.  A similar problem will occur with patients as they grow older and their liver and kidney function begins to decrease as part of the aging process.  Background information: Historically, the original case for using long-term opioid therapy to treat chronic noncancer pain was based on safety assumptions that subsequent experience has called into question. In 1996, the American Pain Society and the American Academy of Pain Medicine issued a consensus statement supporting long-term opioid therapy. This statement acknowledged the dangers of opioid prescribing but concluded that the risk for addiction was low; respiratory depression induced by opioids was short-lived, occurred mainly in opioid-naive patients, and was antagonized by pain; tolerance was not a common problem; and efforts to control diversion should not constrain opioid prescribing. This has now proven to be wrong. Experience regarding the risks for opioid addiction, misuse, and overdose in community practice has failed to support these assumptions.  According to the Centers for Disease Control and Prevention, fatal overdoses involving opioid analgesics have increased sharply over the past decade. Currently, more than 96,700 people die from drug overdoses every year. Opioids are a factor in 7 out of every 10 overdose deaths. Deaths from drug overdose have surpassed motor vehicle accidents as the leading cause of death for individuals between the ages of 80 and 61.  Clinical data suggest that neuroendocrine dysfunction may be very common in both men and women, potentially causing hypogonadism, erectile  dysfunction, infertility, decreased libido, osteoporosis, and depression. Recent studies linked higher opioid dose to increased opioid-related mortality. Controlled observational studies reported that long-term opioid therapy may be associated with increased risk for cardiovascular events. Subsequent meta-analysis concluded  that the safety of long-term opioid therapy in elderly patients has not been proven.   Side Effects and adverse reactions: Common side effects: Drowsiness (sedation). Dizziness. Nausea and vomiting. Constipation. Physical dependence -- Dependence often manifests with withdrawal symptoms when opioids are discontinued or decreased. Tolerance -- As you take repeated doses of opioids, you require increased medication to experience the same effect of pain relief. Respiratory depression -- This can occur in healthy people, especially with higher doses. However, people with COPD, asthma or other lung conditions may be even more susceptible to fatal respiratory impairment.  Uncommon side effects: An increased sensitivity to feeling pain and extreme response to pain (hyperalgesia). Chronic use of opioids can lead to this. Delayed gastric emptying (the process by which the contents of your stomach are moved into your small intestine). Muscle rigidity. Immune system and hormonal dysfunction. Quick, involuntary muscle jerks (myoclonus). Arrhythmia. Itchy skin (pruritus). Dry mouth (xerostomia).  Long-term side effects: Chronic constipation. Sleep-disordered breathing (SDB). Increased risk of bone fractures. Hypothalamic-pituitary-adrenal dysregulation. Increased risk of overdose.  RISKS: Respiratory depression and death: Opioids increase the risk of respiratory depression and death.  Drug-to-drug interactions: Opioids are relatively contraindicated in combination with benzodiazepines, sleep inducers, and other central nervous system depressants. Other classes of medications  (i.e.: certain antibiotics and even over-the-counter medications) may also trigger or induce respiratory depression in some patients.  Medical conditions: Patients with pre-existing respiratory problems are at higher risk of respiratory failure and/or depression when in combination with opioid analgesics. Opioids are relatively contraindicated in some medical conditions such as central sleep apnea.   Fractures and Falls:  Opioids increase the risk and incidence of falls. This is of particular importance in elderly patients.  Endocrine System:  Long-term administration is associated with endocrine abnormalities (endocrinopathies). (Also known as Opioid-induced Endocrinopathy) Influences on both the hypothalamic-pituitary-adrenal axis?and the hypothalamic-pituitary-gonadal axis have been demonstrated with consequent hypogonadism and adrenal insufficiency in both sexes. Hypogonadism and decreased levels of dehydroepiandrosterone sulfate have been reported in men and women. Endocrine effects include: Amenorrhoea in women (abnormal absence of menstruation) Reduced libido in both sexes Decreased sexual function Erectile dysfunction in men Hypogonadisms (decreased testicular function with shrinkage of testicles) Infertility Depression and fatigue Loss of muscle mass Anxiety Depression Immune suppression Hyperalgesia Weight gain Anemia Osteoporosis Patients (particularly women of childbearing age) should avoid opioids. There is insufficient evidence to recommend routine monitoring of asymptomatic patients taking opioids in the long-term for hormonal deficiencies.  Immune System: Human studies have demonstrated that opioids have an immunomodulating effect. These effects are mediated via opioid receptors both on immune effector cells and in the central nervous system. Opioids have been demonstrated to have adverse effects on antimicrobial response and anti-tumour surveillance. Buprenorphine has  been demonstrated to have no impact on immune function.  Opioid Induced Hyperalgesia: Human studies have demonstrated that prolonged use of opioids can lead to a state of abnormal pain sensitivity, sometimes called opioid induced hyperalgesia (OIH). Opioid induced hyperalgesia is not usually seen in the absence of tolerance to opioid analgesia. Clinically, hyperalgesia may be diagnosed if the patient on long-term opioid therapy presents with increased pain. This might be qualitatively and anatomically distinct from pain related to disease progression or to breakthrough pain resulting from development of opioid tolerance. Pain associated with hyperalgesia tends to be more diffuse than the pre-existing pain and less defined in quality. Management of opioid induced hyperalgesia requires opioid dose reduction.  Cancer: Chronic opioid therapy has been associated with an increased risk of cancer  among noncancer patients with chronic pain. This association was more evident in chronic strong opioid users. Chronic opioid consumption causes significant pathological changes in the small intestine and colon. Epidemiological studies have found that there is a link between opium dependence and initiation of gastrointestinal cancers. Cancer is the second leading cause of death after cardiovascular disease. Chronic use of opioids can cause multiple conditions such as GERD, immunosuppression and renal damage as well as carcinogenic effects, which are associated with the incidence of cancers.   Mortality: Long-term opioid use has been associated with increased mortality among patients with chronic non-cancer pain (CNCP).  Prescription of long-acting opioids for chronic noncancer pain was associated with a significantly increased risk of all-cause mortality, including deaths from causes other than overdose.  Reference: Von Korff M, Kolodny A, Deyo RA, Chou R. Long-term opioid therapy reconsidered. Ann Intern Med. 2011  Sep 6;155(5):325-8. doi: 10.7326/[id number]. PMID: 64403474; PMCID: [id number]. Randon Goldsmith, Hayward RA, Dunn KM, [place] KP. Risk of adverse events in patients prescribed long-term opioids: A cohort study in the [place] [facility] Research Datalink. Eur J Pain. 2019 May;23(5):908-922. doi: 10.1002/ejp.1357. Epub 2019 Jan 31. PMID: 43329518. Colameco S, Coren JS, Ciervo CA. Continuous opioid treatment for chronic noncancer pain: a time for moderation in prescribing. Postgrad Med. 2009 Jul;121(4):61-6. doi: 10.3810/pgm.2009.07.2032. PMID: 84166063. William Hamburger RN, [place] SD, Blazina I, Cristopher Peru, Bougatsos C, Deyo RA. The effectiveness and risks of long-term opioid therapy for chronic pain: a systematic review for a [employer] of [employer] Pathways to [employer]. Ann Intern Med. 2015 Feb 17;162(4):276-86. doi: 10.7326/M14-2559. PMID: 01601093. Caryl Bis [facility], Makuc DM. NCHS Data Brief No. 22. [place]: Centers for Disease Control and Prevention; 2009. Sep, Increase in Fatal Poisonings Involving Opioid Analgesics in the [place], 1999-2006. Song IA, Choi HR, Oh TK. Long-term opioid use and mortality in patients with chronic non-cancer pain: Ten-year follow-up study in [place] from 2010 through 2019. EClinicalMedicine. 2022 Jul 18;51:101558. doi: 10.1016/j.eclinm.2022.235573. PMID: 22025427; PMCID: [id number]. Huser, W., Schubert, T., Vogelmann, T. et al. All-cause mortality in patients with long-term opioid therapy compared with non-opioid analgesics for chronic non-cancer pain: a database study. BMC Med 18, 162 (2020). http://lester.info/ Rashidian H, Karie Kirks, Malekzadeh R, Haghdoost AA. An Ecological Study of the Association between Opiate Use and Incidence of Cancers. Addict Health. 2016 Fall;8(4):252-260. PMID: 51761607; PMCID: [id number].  Our Goal: Our goal is to control your  pain with means other than the use of opioid pain medications.  Our Recommendation: Talk to your physician about coming off of these medications. We can assist you with the tapering down and stopping these medicines. Based on the new information, even if you cannot completely stop the medication, a decrease in the dose may be associated with a lesser risk. Ask for other means of controlling the pain. Decrease or eliminate those factors that significantly contribute to your pain such as smoking, obesity, and a diet heavily tilted towards "inflammatory" nutrients.  Last Updated: 07/25/2022   ____________________________________________________________________________________________     ____________________________________________________________________________________________  National Pain Medication Shortage  The [place] is experiencing worsening drug shortages. These have had a negative widespread effect on patient care and treatment. Not expected to improve any time soon. Predicted to last past 2029.   Drug shortage list (generic names) Oxycodone IR Oxycodone/APAP Oxymorphone IR Hydromorphone Hydrocodone/APAP Morphine  Where is the problem?  Manufacturing and supply level.  Will this shortage affect you?  Only if you  take any of the above pain medications.  How? You may be unable to fill your prescription.  Your pharmacist may offer a "partial fill" of your prescription. (Warning: Do not accept partial fills.) Prescriptions partially filled cannot be transferred to another pharmacy. Read our Medication Rules and Regulation. Depending on how much medicine you are dependent on, you may experience withdrawals when unable to get the medication.  Recommendations: Consider ending your dependence on opioid pain medications. Ask your pain specialist to assist you with the process. Consider switching to a medication currently not in shortage, such as Buprenorphine. Talk to your pain  specialist about this option. Consider decreasing your pain medication requirements by managing tolerance thru "Drug Holidays". This may help minimize withdrawals, should you run out of medicine. Control your pain thru the use of non-pharmacological interventional therapies.   Your prescriber: Prescribers cannot be blamed for shortages. Medication manufacturing and supply issues cannot be fixed by the prescriber.   NOTE: The prescriber is not responsible for supplying the medication, or solving supply issues. Work with your pharmacist to solve it. The patient is responsible for the decision to take or continue taking the medication and for identifying and securing a legal supply source. By law, supplying the medication is the job and responsibility of the pharmacy. The prescriber is responsible for the evaluation, monitoring, and prescribing of these medications.   Prescribers will NOT: Re-issue prescriptions that have been partially filled. Re-issue prescriptions already sent to a pharmacy.  Re-send prescriptions to a different pharmacy because yours did not have your medication. Ask pharmacist to order more medicine or transfer the prescription to another pharmacy. (Read below.)  New 2023 regulation: "September 17, 2021 Revised Regulation Allows DEA-Registered Pharmacies to Transfer Electronic Prescriptions at a Patient's Request DEA Headquarters Division - Public Information Office Patients now have the ability to request their electronic prescription be transferred to another pharmacy without having to go back to their practitioner to initiate the request. This revised regulation went into effect on Monday, September 13, 2021.     At a patient's request, a DEA-registered retail pharmacy can now transfer an electronic prescription for a controlled substance (schedules II-V) to another DEA-registered retail pharmacy. Prior to this change, patients would have to go through their practitioner to  cancel their prescription and have it re-issued to a different pharmacy. The process was taxing and time consuming for both patients and practitioners.    The Drug Enforcement Administration [facility]) published its intent to revise the process for transferring electronic prescriptions on December 06, 2019.  The final rule was published in the federal register on August 12, 2021 and went into effect 30 days later.  Under the final rule, a prescription can only be transferred once between pharmacies, and only if allowed under existing state or other applicable law. The prescription must remain in its electronic form; may not be altered in any way; and the transfer must be communicated directly between two licensed pharmacists. It's important to note, any authorized refills transfer with the original prescription, which means the entire prescription will be filled at the same pharmacy".  Reference: HugeHand.is [facility] website announcement)  [email or web address] [employer] of Justice)   [employer] / Vol. 88, No. 143 / Thursday, August 12, 2021 / Rules and Regulations DEPARTMENT OF JUSTICE  Drug Enforcement Administration  21 CFR Part 1306  [Docket No. [id number]]  RIN [id number] Transfer of Electronic Prescriptions for Schedules II-V Controlled Substances Between Pharmacies for Initial Filling  ____________________________________________________________________________________________  ____________________________________________________________________________________________  Transfer of Pain Medication between Pharmacies  Re: 2023 DEA Clarification on existing regulation  Published on DEA Website: September 17, 2021  Title: Revised Regulation Allows DEA-Registered Pharmacies to Electrical engineer Prescriptions at a Patient's  Request DEA Headquarters Division - [employer]  "Patients now have the ability to request their electronic prescription be transferred to another pharmacy without having to go back to their practitioner to initiate the request. This revised regulation went into effect on Monday, September 13, 2021.     At a patient's request, a DEA-registered retail pharmacy can now transfer an electronic prescription for a controlled substance (schedules II-V) to another DEA-registered retail pharmacy. Prior to this change, patients would have to go through their practitioner to cancel their prescription and have it re-issued to a different pharmacy. The process was taxing and time consuming for both patients and practitioners.    The Drug Enforcement Administration [facility]) published its intent to revise the process for transferring electronic prescriptions on December 06, 2019.  The final rule was published in the federal register on August 12, 2021 and went into effect 30 days later.  Under the final rule, a prescription can only be transferred once between pharmacies, and only if allowed under existing state or other applicable law. The prescription must remain in its electronic form; may not be altered in any way; and the transfer must be communicated directly between two licensed pharmacists. It's important to note, any authorized refills transfer with the original prescription, which means the entire prescription will be filled at the same pharmacy."    REFERENCES: 1. DEA website announcement [email or web address]  2. Department of Justice website  [email or web address].pdf  3. DEPARTMENT OF JUSTICE Drug Enforcement Administration 21 CFR Part 1306 [Docket No. [id number]] RIN [id number] "Transfer of Electronic Prescriptions for Schedules II-V Controlled Substances  Between Pharmacies for Initial Filling"  ____________________________________________________________________________________________     _______________________________________________________________________  Medication Rules  Purpose: To inform patients, and their family members, of our medication rules and regulations.  Applies to: All patients receiving prescriptions from our practice (written or electronic).  Pharmacy of record: This is the pharmacy where your electronic prescriptions will be sent. Make sure we have the correct one.  Electronic prescriptions: In compliance with the [facility] Strengthen Opioid Misuse Prevention (STOP) Act of 2017 (Session Conni Elliot [phone]), effective January 17, 2018, all controlled substances must be electronically prescribed. Written prescriptions, faxing, or calling prescriptions to a pharmacy will no longer be done.  Prescription refills: These will be provided only during in-person appointments. No medications will be renewed without a "face-to-face" evaluation with your provider. Applies to all prescriptions.  NOTE: The following applies primarily to controlled substances (Opioid* Pain Medications).   Type of encounter (visit): For patients receiving controlled substances, face-to-face visits are required. (Not an option and not up to the patient.)  Patient's responsibilities: Pain Pills: Bring all pain pills to every appointment (except for procedure appointments). Pill Bottles: Bring pills in original pharmacy bottle. Bring bottle, even if empty. Always bring the bottle of the most recent fill.  Medication refills: You are responsible for knowing and keeping track of what medications you are taking and when is it that you will need a refill. The day before your appointment: write a list of all prescriptions that need to be refilled. The day of the appointment: give the list to the admitting nurse. Prescriptions will be written only  during appointments. No prescriptions will be written on procedure days. If you forget a  medication: it will not be "Called in", "Faxed", or "electronically sent". You will need to get another appointment to get these prescribed. No early refills. Do not call asking to have your prescription filled early. Partial  or short prescriptions: Occasionally your pharmacy may not have enough pills to fill your prescription.  NEVER ACCEPT a partial fill or a prescription that is short of the total amount of pills that you were prescribed.  With controlled substances the law allows 72 hours for the pharmacy to complete the prescription.  If the prescription is not completed within 72 hours, the pharmacist will require a new prescription to be written. This means that you will be short on your medicine and we WILL NOT send another prescription to complete your original prescription.  Instead, request the pharmacy to send a carrier to a nearby branch to get enough medication to provide you with your full prescription. Prescription Accuracy: You are responsible for carefully inspecting your prescriptions before leaving our office. Have the discharge nurse carefully go over each prescription with you, before taking them home. Make sure that your name is accurately spelled, that your address is correct. Check the name and dose of your medication to make sure it is accurate. Check the number of pills, and the written instructions to make sure they are clear and accurate. Make sure that you are given enough medication to last until your next medication refill appointment. Taking Medication: Take medication as prescribed. When it comes to controlled substances, taking less pills or less frequently than prescribed is permitted and encouraged. Never take more pills than instructed. Never take the medication more frequently than prescribed.  Inform other Doctors: Always inform, all of your healthcare providers, of all the  medications you take. Pain Medication from other Providers: You are not allowed to accept any additional pain medication from any other Doctor or Healthcare provider. There are two exceptions to this rule. (see below) In the event that you require additional pain medication, you are responsible for notifying us, as stated below. Cough Medicine: Often these contain an opioid, such as codeine or hydrocodone. Never accept or take cough medicine containing these opioids if you are already taking an opioid* medication. The combination may cause respiratory failure and death. Medication Agreement: You are responsible for carefully reading and following our Medication Agreement. This must be signed before receiving any prescriptions from our practice. Safely store a copy of your signed Agreement. Violations to the Agreement will result in no further prescriptions. (Additional copies of our Medication Agreement are available upon request.) Laws, Rules, & Regulations: All patients are expected to follow all [address] and [employer], [employer], Rules, [place] [place]. Ignorance of the Laws does not constitute a valid excuse.  Illegal drugs and Controlled Substances: The use of illegal substances (including, but not limited to marijuana and its derivatives) and/or the illegal use of any controlled substances is strictly prohibited. Violation of this rule may result in the immediate and permanent discontinuation of any and all prescriptions being written by our practice. The use of any illegal substances is prohibited. Adopted CDC guidelines & recommendations: Target dosing levels will be at or below 60 MME/day. Use of benzodiazepines** is not recommended.  Exceptions: There are only two exceptions to the rule of not receiving pain medications from other Healthcare Providers. Exception #1 (Emergencies): In the event of an emergency (i.e.: accident requiring emergency care), you are allowed to receive additional pain  medication. However, you are responsible for: As soon as  you are able, call our office [phone], at any time of the day or night, and leave a message stating your name, the date and nature of the emergency, and the name and dose of the medication prescribed. In the event that your call is answered by a member of our staff, make sure to document and save the date, time, and the name of the person that took your information.  Exception #2 (Planned Surgery): In the event that you are scheduled by another doctor or dentist to have any type of surgery or procedure, you are allowed (for a period no longer than 30 days), to receive additional pain medication, for the acute post-op pain. However, in this case, you are responsible for picking up a copy of our "Post-op Pain Management for Surgeons" handout, and giving it to your surgeon or dentist. This document is available at our office, and does not require an appointment to obtain it. Simply go to our office during business hours (Monday-Thursday from 8:00 AM to 4:00 PM) (Friday 8:00 AM to 12:00 Noon) or if you have a scheduled appointment with [place], prior to your surgery, and ask for it by name. In addition, you are responsible for: calling our office [phone], at any time of the day or night, and leaving a message stating your name, name of your surgeon, type of surgery, and date of procedure or surgery. Failure to comply with your responsibilities may result in termination of therapy involving the controlled substances. Medication Agreement Violation. Following the above rules, including your responsibilities will help you in avoiding a Medication Agreement Violation ("Breaking your Pain Medication Contract").  Consequences:  Not following the above rules may result in permanent discontinuation of medication prescription therapy.  *Opioid medications include: morphine, codeine, oxycodone, oxymorphone, hydrocodone, hydromorphone, meperidine, tramadol,  tapentadol, buprenorphine, fentanyl, methadone. **Benzodiazepine medications include: diazepam (Valium), alprazolam (Xanax), clonazepam (Klonopine), lorazepam (Ativan), clorazepate (Tranxene), chlordiazepoxide (Librium), estazolam (Prosom), oxazepam (Serax), temazepam (Restoril), triazolam (Halcion) (Last updated: 11/09/2021) ______________________________________________________________________    ______________________________________________________________________  Medication Recommendations and Reminders  Applies to: All patients receiving prescriptions (written and/or electronic).  Medication Rules & Regulations: You are responsible for reading, knowing, and following our "Medication Rules" document. These exist for your safety and that of others. They are not flexible and neither are we. Dismissing or ignoring them is an act of "non-compliance" that may result in complete and irreversible termination of such medication therapy. For safety reasons, "non-compliance" will not be tolerated. As with the [place] fundamental legal principle of "ignorance of the law is no defense", we will accept no excuses for not having read and knowing the content of documents provided to you by our practice.  Pharmacy of record:  Definition: This is the pharmacy where your electronic prescriptions will be sent.  We do not endorse any particular pharmacy. It is up to you and your insurance to decide what pharmacy to use.  We do not restrict you in your choice of pharmacy. However, once we write for your prescriptions, we will NOT be re-sending more prescriptions to fix restricted supply problems created by your pharmacy, or your insurance.  The pharmacy listed in the electronic medical record should be the one where you want electronic prescriptions to be sent. If you choose to change pharmacy, simply notify our nursing staff. Changes will be made only during your regular appointments and not over the  phone.  Recommendations: Keep all of your pain medications in a safe place, under lock and key, even  if you live alone. We will NOT replace lost, stolen, or damaged medication. We do not accept "Police Reports" as proof of medications having been stolen. After you fill your prescription, take 1 week's worth of pills and put them away in a safe place. You should keep a separate, properly labeled bottle for this purpose. The remainder should be kept in the original bottle. Use this as your primary supply, until it runs out. Once it's gone, then you know that you have 1 week's worth of medicine, and it is time to come in for a prescription refill. If you do this correctly, it is unlikely that you will ever run out of medicine. To make sure that the above recommendation works, it is very important that you make sure your medication refill appointments are scheduled at least 1 week before you run out of medicine. To do this in an effective manner, make sure that you do not leave the office without scheduling your next medication management appointment. Always ask the nursing staff to show you in your prescription , when your medication will be running out. Then arrange for the receptionist to get you a return appointment, at least 7 days before you run out of medicine. Do not wait until you have 1 or 2 pills left, to come in. This is very poor planning and does not take into consideration that we may need to cancel appointments due to bad weather, sickness, or emergencies affecting our staff. DO NOT ACCEPT A "Partial Fill": If for any reason your pharmacy does not have enough pills/tablets to completely fill or refill your prescription, do not allow for a "partial fill". The law allows the pharmacy to complete that prescription within 72 hours, without requiring a new prescription. If they do not fill the rest of your prescription within those 72 hours, you will need a separate prescription to fill the remaining  amount, which we will NOT provide. If the reason for the partial fill is your insurance, you will need to talk to the pharmacist about payment alternatives for the remaining tablets, but again, DO NOT ACCEPT A PARTIAL FILL, unless you can trust your pharmacist to obtain the remainder of the pills within 72 hours.  Prescription refills and/or changes in medication(s):  Prescription refills, and/or changes in dose or medication, will be conducted only during scheduled medication management appointments. (Applies to both, written and electronic prescriptions.) No refills on procedure days. No medication will be changed or started on procedure days. No changes, adjustments, and/or refills will be conducted on a procedure day. Doing so will interfere with the diagnostic portion of the procedure. No phone refills. No medications will be "called into the pharmacy". No Fax refills. No weekend refills. No Holliday refills. No after hours refills.  Remember:  Business hours are:  Monday to Thursday 8:00 AM to 4:00 PM Provider's Schedule: Delano Metz, MD - Appointments are:  Medication management: Monday and Wednesday 8:00 AM to 4:00 PM Procedure day: Tuesday and Thursday 7:30 AM to 4:00 PM Edward Jolly, MD - Appointments are:  Medication management: Tuesday and Thursday 8:00 AM to 4:00 PM Procedure day: Monday and Wednesday 7:30 AM to 4:00 PM (Last update: 11/09/2021) ______________________________________________________________________   ____________________________________________________________________________________________  Naloxone Nasal Spray  Why am I receiving this medication? [place] STOP ACT requires that all patients taking high dose opioids or at risk of opioids respiratory depression, be prescribed an opioid reversal agent, such as Naloxone (AKA: Narcan).  What is this medication? NALOXONE (  nal OX one) treats opioid overdose, which causes slow or shallow breathing,  severe drowsiness, or trouble staying awake. Call emergency services after using this medication. You may need additional treatment. Naloxone works by reversing the effects of opioids. It belongs to a group of medications called opioid blockers.  COMMON BRAND NAME(S): Kloxxado, Narcan  What should I tell my care team before I take this medication? They need to know if you have any of these conditions: Heart disease Substance use disorder An unusual or allergic reaction to naloxone, other medications, foods, dyes, or preservatives Pregnant or trying to get pregnant Breast-feeding  When to use this medication? This medication is to be used for the treatment of respiratory depression (less than 8 breaths per minute) secondary to opioid overdose.   How to use this medication? This medication is for use in the nose. Lay the person on their back. Support their neck with your hand and allow the head to tilt back before giving the medication. The nasal spray should be given into 1 nostril. After giving the medication, move the person onto their side. Do not remove or test the nasal spray until ready to use. Get emergency medical help right away after giving the first dose of this medication, even if the person wakes up. You should be familiar with how to recognize the signs and symptoms of a narcotic overdose. If more doses are needed, give the additional dose in the other nostril. Talk to your care team about the use of this medication in children. While this medication may be prescribed for children as young as newborns for selected conditions, precautions do apply.  Naloxone Overdosage: If you think you have taken too much of this medicine contact a poison control center or emergency room at once.  NOTE: This medicine is only for you. Do not share this medicine with others.  What if I miss a dose? This does not apply.  What may interact with this medication? This is only used during an  emergency. No interactions are expected during emergency use. This list may not describe all possible interactions. Give your health care provider a list of all the medicines, herbs, non-prescription drugs, or dietary supplements you use. Also tell them if you smoke, drink alcohol, or use illegal drugs. Some items may interact with your medicine.  What should I watch for while using this medication? Keep this medication ready for use in the case of an opioid overdose. Make sure that you have the phone number of your care team and local hospital ready. You may need to have additional doses of this medication. Each nasal spray contains a single dose. Some emergencies may require additional doses. After use, bring the treated person to the nearest hospital or call 911. Make sure the treating care team knows that the person has received a dose of this medication. You will receive additional instructions on what to do during and after use of this medication before an emergency occurs.  What side effects may I notice from receiving this medication? Side effects that you should report to your care team as soon as possible: Allergic reactions--skin rash, itching, hives, swelling of the face, lips, tongue, or throat Side effects that usually do not require medical attention (report these to your care team if they continue or are bothersome): Constipation Dryness or irritation inside the nose Headache Increase in blood pressure Muscle spasms Stuffy nose Toothache This list may not describe all possible side effects. Call your doctor for  medical advice about side effects. You may report side effects to FDA at 1-800-FDA-1088.  Where should I keep my medication? Because this is an emergency medication, you should keep it with you at all times.  Keep out of the reach of children and pets. Store between 20 and 25 degrees C (68 and 77 degrees F). Do not freeze. Throw away any unused medication after the  expiration date. Keep in original box until ready to use.  NOTE: This sheet is a summary. It may not cover all possible information. If you have questions about this medicine, talk to your doctor, pharmacist, or health care provider.   2023 Elsevier/Gold Standard (2020-09-11 00:00:00)  ____________________________________________________________________________________________

## 2022-10-26 ENCOUNTER — Ambulatory Visit: Payer: BC Managed Care – PPO | Attending: Pain Medicine | Admitting: Pain Medicine

## 2022-10-26 ENCOUNTER — Encounter: Payer: Self-pay | Admitting: Pain Medicine

## 2022-10-26 VITALS — BP 148/88 | HR 78 | Temp 98.2°F | Resp 16 | Ht 66.0 in | Wt 214.0 lb

## 2022-10-26 DIAGNOSIS — Z79891 Long term (current) use of opiate analgesic: Secondary | ICD-10-CM | POA: Insufficient documentation

## 2022-10-26 DIAGNOSIS — M25561 Pain in right knee: Secondary | ICD-10-CM | POA: Diagnosis present

## 2022-10-26 DIAGNOSIS — M25511 Pain in right shoulder: Secondary | ICD-10-CM | POA: Insufficient documentation

## 2022-10-26 DIAGNOSIS — G8929 Other chronic pain: Secondary | ICD-10-CM | POA: Insufficient documentation

## 2022-10-26 DIAGNOSIS — M545 Low back pain, unspecified: Secondary | ICD-10-CM | POA: Insufficient documentation

## 2022-10-26 DIAGNOSIS — K5909 Other constipation: Secondary | ICD-10-CM | POA: Diagnosis present

## 2022-10-26 DIAGNOSIS — M79604 Pain in right leg: Secondary | ICD-10-CM | POA: Insufficient documentation

## 2022-10-26 DIAGNOSIS — G894 Chronic pain syndrome: Secondary | ICD-10-CM | POA: Insufficient documentation

## 2022-10-26 DIAGNOSIS — Z79899 Other long term (current) drug therapy: Secondary | ICD-10-CM | POA: Insufficient documentation

## 2022-10-26 DIAGNOSIS — M5136 Other intervertebral disc degeneration, lumbar region with discogenic back pain only: Secondary | ICD-10-CM | POA: Insufficient documentation

## 2022-10-26 DIAGNOSIS — M961 Postlaminectomy syndrome, not elsewhere classified: Secondary | ICD-10-CM | POA: Insufficient documentation

## 2022-10-26 MED ORDER — OXYCODONE HCL 5 MG PO TABS: 5.0000 mg | ORAL_TABLET | Freq: Four times a day (QID) | ORAL | 0 refills | Status: DC | PRN

## 2022-10-26 MED ORDER — NALOXONE HCL 4 MG/0.1ML NA LIQD: 1.0000 | NASAL | 0 refills | Status: DC | PRN

## 2022-10-26 MED ORDER — OXYCODONE HCL 5 MG PO TABS
5.0000 mg | ORAL_TABLET | Freq: Four times a day (QID) | ORAL | 0 refills | Status: DC | PRN
Start: 2022-12-01 — End: 2023-01-24

## 2022-10-26 NOTE — Progress Notes (Signed)
Nursing Pain Medication Assessment:  Safety precautions to be maintained throughout the outpatient stay will include: orient to surroundings, keep bed in low position, maintain call bell within reach at all times, provide assistance with transfer out of bed and ambulation.  Medication Inspection Compliance: Pill count conducted under aseptic conditions, in front of the patient. Neither the pills nor the bottle was removed from the patient's sight at any time. Once count was completed pills were immediately returned to the patient in their original bottle.  Medication: Oxycodone IR Pill/Patch Count:  32 of 120 pills remain Pill/Patch Appearance: Markings consistent with prescribed medication Bottle Appearance: Standard pharmacy container. Clearly labeled. Filled Date: 09 / 16 / 2024 Last Medication intake:  Today

## 2022-12-06 NOTE — H&P (Signed)
Patient's anticipated LOS is less than 2 midnights, meeting these requirements: - Younger than 46 - Lives within 1 hour of care - Has a competent adult at home to recover with post-op recover - NO history of  - Chronic pain requiring opiods  - Diabetes  - Coronary Artery Disease  - Heart failure  - Heart attack  - Stroke  - DVT/VTE  - Cardiac arrhythmia  - Respiratory Failure/COPD  - Renal failure  - Anemia  - Advanced Liver disease     Adam Petty. is an 62 y.o. male.    Chief Complaint: right shoulder pain  HPI: Pt is a 62 y.o. male complaining of right shoulder pain for multiple years. Pain had continually increased since the beginning. X-rays in the clinic show end-stage arthritic changes of the right shoulder. Pt has tried various conservative treatments which have failed to alleviate their symptoms, including injections and therapy. Various options are discussed with the patient. Risks, benefits and expectations were discussed with the patient. Patient understand the risks, benefits and expectations and wishes to proceed with surgery.   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   Back pain    history spinal stenosis, DDD   Chronic pain syndrome 10/22/2014   DJD (degenerative joint disease)    Dyslipidemia    Essential hypertension    History of kidney stones    Hypercholesterolemia    IBS (irritable bowel syndrome)    Lateral meniscus tear    rt knee   Osteoarthritis    Preventative health care    takes crestor/ altace for "preventative reasons" denies heart or BP problems   Sleep apnea    had UPPP surgery and now sleep apnea resolved   Spinal stenosis    Type 2 diabetes mellitus without complication (HCC)     PSH: Past Surgical History:  Procedure Laterality Date   CERVICAL FUSION  1995   COLONOSCOPY  01/24/2003   COLONOSCOPY  01/16/2018   CTR Left 02/27/2018    CTR and trigger finger repair   CYSTOSCOPY W/ RETROGRADES Right 11/19/2015   Procedure: URETEROSCOPY WITH RETROGRADE PYELOGRAM;  Surgeon: Orson Ape, MD;  Location: [facility];  Service: Urology;  Laterality: Right;   CYSTOSCOPY W/ RETROGRADES Right 10/03/2017   Procedure: CYSTOSCOPY WITH RETROGRADE PYELOGRAM;  Surgeon: Orson Ape, MD;  Location: [facility];  Service: Urology;  Laterality: Right;   CYSTOSCOPY/URETEROSCOPY/HOLMIUM LASER/STENT PLACEMENT Right 08/09/2022   Procedure: CYSTOSCOPY/URETEROSCOPY/HOLMIUM LASER/STENT PLACEMENT;  Surgeon: Riki Altes, MD;  Location: [facility];  Service: Urology;  Laterality: Right;   FOOT SURGERY     RT   HAND SURGERY Left 10/21/2022   trigger finger   IRRIGATION AND DEBRIDEMENT KNEE Right 03/08/2022   Procedure: IRRIGATION AND DEBRIDEMENT RIGHT KNEE;  Surgeon: Lyndle Herrlich, MD;  Location: [facility];  Service: Orthopedics;  Laterality: Right;   KNEE ARTHROSCOPY Right 10/02/2013   Procedure: RIGHT KNEE ARTHROSCOPY, PARTIAL LATERAL MENISCECTOMY, DEBRIDEMENT, MEDIAL AND LATERAL CHONDROPLASTY;  Surgeon: Loanne Drilling, MD;  Location: [facility];  Service: Orthopedics;  Laterality: Right;   KNEE ARTHROSCOPY WITH MEDIAL MENISECTOMY Right 03/25/2015   Procedure: RIGHT KNEE ARTHROSCOPY WITH MENISCAL DEBRIDEMENT and chrodroplasty;  Surgeon: Ollen Gross, MD;  Location: [facility];  Service: Orthopedics;  Laterality: Right;   KNEE BURSECTOMY Right 03/25/2015   Procedure: RIGHT KNEE PREPATELLA BURSECTOMY;  Surgeon: Ollen Gross, MD;  Location: [facility];  Service: Orthopedics;  Laterality: Right;   LUMBAR LAMINECTOMY/DECOMPRESSION MICRODISCECTOMY N/A 02/06/2013   Procedure: MICRO LUMBAR DECOMPRESSION L3-L4 and L4 - L5 ;  Surgeon: Javier Docker, MD;  Location: [facility];  Service: Orthopedics;  Laterality: N/A;   PALATE SURGERY     surg for sleep apnea and sinus issues   ROTATOR CUFF REPAIR Right    x3   ROTATOR CUFF REPAIR Left    SHOULDER SURGERY Right     spur removal x2   TONSILLECTOMY     TOTAL KNEE ARTHROPLASTY Right 12/28/2021   Procedure: TOTAL KNEE ARTHROPLASTY;  Surgeon: Lyndle Herrlich, MD;  Location: [facility];  Service: Orthopedics;  Laterality: Right;   URETEROSCOPY Right 10/03/2017   Procedure: URETEROSCOPY;  Surgeon: Orson Ape, MD;  Location: [facility];  Service: Urology;  Laterality: Right;    Social History:  reports that he has never smoked. He has quit using smokeless tobacco.  His smokeless tobacco use included chew. He reports current alcohol use. He reports that he does not use drugs. BMI: Estimated body mass index is 34.54 kg/m as calculated from the following:   Height as of 10/26/22: 5\' 6"  (1.676 m).   Weight as of 10/26/22: 97.1 kg.  Lab Results  Component Value Date   ALBUMIN 3.8 12/27/2020   Diabetes:   Patient has a diagnosis of diabetes,  Lab Results  Component Value Date   HGBA1C 6.6 (H) 12/28/2021   Smoking Status:   reports that he has never smoked. He has quit using smokeless tobacco.  His smokeless tobacco use included chew.    Allergies:  No Known Allergies  Medications: No current facility-administered medications for this encounter.   Current Outpatient Medications  Medication Sig Dispense Refill   cyanocobalamin (,VITAMIN B-12,) 1000 MCG/ML injection Inject into the muscle every 30 (thirty) days.      docusate sodium (COLACE) 100 MG capsule Take 1 capsule (100 mg total) by mouth 2 (two) times daily. 60 capsule 0   gabapentin (NEURONTIN) 600 MG tablet Take 1 tablet (600 mg total) by mouth every 6 (six) hours. (Patient taking differently: Take 1,200 mg by mouth 2 (two) times daily.) 360 tablet 0   ibuprofen (ADVIL) 800 MG tablet Take 800 mg by mouth 3 (three) times daily.     metFORMIN (GLUCOPHAGE) 1000 MG tablet TAKE 1 TABLET (1,000 MG TOTAL) BY MOUTH 2 (TWO) TIMES DAILY WITH MEALS.  1   naloxone (NARCAN) nasal spray 4 mg/0.1 mL Place 1 spray into the nose as needed for up to 365  doses (for opioid-induced respiratory depresssion). In case of emergency (overdose), spray once into each nostril. If no response within 3 minutes, repeat application and call 911. 1 each 0   naproxen sodium (ALEVE) 220 MG tablet Take 440 mg by mouth 2 (two) times daily as needed.      oxyCODONE (OXY IR/ROXICODONE) 5 MG immediate release tablet Take 1 tablet (5 mg total) by mouth every 6 (six) hours as needed for severe pain. Must last 30 days. 120 tablet 0   oxyCODONE (OXY IR/ROXICODONE) 5 MG immediate release tablet Take 1 tablet (5 mg total) by mouth every 6 (six) hours as needed for severe pain. Must last 30 days. 120 tablet 0   [START ON 12/31/2022] oxyCODONE (OXY IR/ROXICODONE) 5 MG immediate release tablet Take 1 tablet (5 mg total) by mouth every 6 (six) hours as needed for severe pain. Must last 30 days. 120 tablet 0   OZEMPIC, 0.25 OR  0.5 MG/DOSE, 2 MG/3ML SOPN Inject 0.5 mg into the skin once a week. Sunday     polyethylene glycol (MIRALAX / GLYCOLAX) 17 g packet Take 17 g by mouth daily.     rosuvastatin (CRESTOR) 5 MG tablet Take 5 mg by mouth 3 (three) times a week. Monday Wednesday Friday      No results found for this or any previous visit (from the past 48 hour(s)). No results found.  ROS: Pain with rom of the right upper extremity  Physical Exam: Alert and oriented 62 y.o. male in no acute distress Cranial nerves 2-12 intact Cervical spine: full rom with no tenderness, nv intact distally Chest: active breath sounds bilaterally, no wheeze rhonchi or rales Heart: regular rate and rhythm, no murmur Abd: non tender non distended with active bowel sounds Hip is stable with rom  Right shoulder painful and weak rom Nv intact distally No rashes or edema distally  Assessment/Plan Assessment: right shoulder cuff arthropathy  Plan:  Patient will undergo a right reverse total shoulder by Dr. Ranell Patrick at [place] Risks benefits and expectations were discussed with the patient.  Patient understand risks, benefits and expectations and wishes to proceed. Preoperative templating of the joint replacement has been completed, documented, and submitted to the Operating Room personnel in order to optimize intra-operative equipment management.   Alphonsa Overall PA-C, MPAS [facility] is now [employer] [address], [place], [place] [address] Phone: [phone] [email or web address] Facebook  [employer]

## 2022-12-20 ENCOUNTER — Encounter: Payer: Self-pay | Admitting: Neurology

## 2022-12-20 ENCOUNTER — Other Ambulatory Visit: Payer: Self-pay | Admitting: Neurology

## 2022-12-20 DIAGNOSIS — R55 Syncope and collapse: Secondary | ICD-10-CM

## 2022-12-20 NOTE — Progress Notes (Addendum)
Anesthesia Review:  PCP:  [name] at [facility]  LOV 12/07/22  Cardiologist : Juliann Pares - LOV 10/27/22  Endo- DR Gershon Crane- LOV 12/05/22  Neuro- Radtke- LOV 12/20/22  Chest x-ray : EKG :  10/04/22- have requested tracing from [facility] by fax    EEG- 12/20/22  Echo : Stress test: 10/13/22 Care Everywhere  Cardiac Cath :  Activity level: can do a flight of stairs without difficulty  Sleep Study/ CPAP :hx of sleep apnea had corrective surgery  Fasting Blood Sugar :      / Checks Blood Sugar -- times a day:   Blood Thinner/ Instructions /Last Dose: ASA / Instructions/ Last Dose :      DM- type 2 - checks gluocose daily in am  Hgba1c-12/05/22- 7.0    Metformin- none day of surgery  Ozempic last dose on 12/20/22    Hx of headaches and weakness- Cause has yet to be determined per pt .  EEG done 12/20/2022 and MRI done 12/22/22 per pt .

## 2022-12-20 NOTE — Patient Instructions (Addendum)
SURGICAL WAITING ROOM VISITATION  Patients having surgery or a procedure may have no more than 2 support people in the waiting area - these visitors may rotate.    Children under the age of 105 must have an adult with them who is not the patient.  Due to an increase in RSV and influenza rates and associated hospitalizations, children ages 19 and under may not visit patients in [facility] hospitals.  If the patient needs to stay at the hospital during part of their recovery, the visitor guidelines for inpatient rooms apply. Pre-op nurse will coordinate an appropriate time for 1 support person to accompany patient in pre-op.  This support person may not rotate.    Please refer to the [facility] website for the visitor guidelines for Inpatients (after your surgery is over and you are in a regular room).       Your procedure is scheduled on:  12/30/2022    Report to [facility] Main Entrance    Report to admitting at   [phone]   Call this number if you have problems the morning of surgery [phone]   Do not eat food :After Midnight.   After Midnight you may have the following liquids until ___ 0430___ AM DAY OF SURGERY  Water Non-Citrus Juices (without pulp, NO RED-Apple, White grape, White cranberry) Black Coffee (NO MILK/CREAM OR CREAMERS, sugar ok)  Clear Tea (NO MILK/CREAM OR CREAMERS, sugar ok) regular and decaf                             Plain Jell-O (NO RED)                                           Fruit ices (not with fruit pulp, NO RED)                                     Popsicles (NO RED)                                                               Sports drinks like Gatorade (NO RED)                     The day of surgery:  Drink ONE (1) Pre-Surgery Clear Ensure or G2 at  0430AM  ( have completed by ) the morning of surgery. Drink in one sitting. Do not sip.  This drink was given to you during your hospital  pre-op appointment visit. Nothing else to  drink after completing the  Pre-Surgery Clear Ensure or G2.          If you have questions, please contact your surgeon's office.       Oral Hygiene is also important to reduce your risk of infection.                                    Remember - BRUSH YOUR TEETH THE MORNING OF SURGERY WITH  YOUR REGULAR TOOTHPASTE  DENTURES WILL BE REMOVED PRIOR TO SURGERY PLEASE DO NOT APPLY "Poly grip" OR ADHESIVES!!!   Do NOT smoke after Midnight   Stop all vitamins and herbal supplements 7 days before surgery.   Take these medicines the morning of surgery with A SIP OF WATER:   gabapentin              Metformin- none day of surgery              Ozempic- Last dose on   DO NOT TAKE ANY ORAL DIABETIC MEDICATIONS DAY OF YOUR SURGERY  Bring CPAP mask and tubing day of surgery.                              You may not have any metal on your body including hair pins, jewelry, and body piercing             Do not wear make-up, lotions, powders, perfumes/cologne, or deodorant  Do not wear nail polish including gel and S&S, artificial/acrylic nails, or any other type of covering on natural nails including finger and toenails. If you have artificial nails, gel coating, etc. that needs to be removed by a nail salon please have this removed prior to surgery or surgery may need to be canceled/ delayed if the surgeon/ anesthesia feels like they are unable to be safely monitored.   Do not shave  48 hours prior to surgery.               Men may shave face and neck.   Do not bring valuables to the hospital. [facility] IS NOT             RESPONSIBLE   FOR VALUABLES.   Contacts, glasses, dentures or bridgework may not be worn into surgery.   Bring small overnight bag day of surgery.   DO NOT BRING YOUR HOME MEDICATIONS TO THE HOSPITAL. PHARMACY WILL DISPENSE MEDICATIONS LISTED ON YOUR MEDICATION LIST TO YOU DURING YOUR ADMISSION IN THE HOSPITAL!    Patients discharged on the day of surgery will not be  allowed to drive home.  Someone NEEDS to stay with you for the first 24 hours after anesthesia.   Special Instructions: Bring a copy of your healthcare power of attorney and living will documents the day of surgery if you haven't scanned them before.              Please read over the following fact sheets you were given: IF YOU HAVE QUESTIONS ABOUT YOUR PRE-OP INSTRUCTIONS PLEASE CALL [phone]   If you received a COVID test during your pre-op visit  it is requested that you wear a mask when out in public, stay away from anyone that may not be feeling well and notify your surgeon if you develop symptoms. If you test positive for Covid or have been in contact with anyone that has tested positive in the last 10 days please notify you surgeon.      Pre-operative 5 CHG Bath Instructions   You can play a key role in reducing the risk of infection after surgery. Your skin needs to be as free of germs as possible. You can reduce the number of germs on your skin by washing with CHG (chlorhexidine gluconate) soap before surgery. CHG is an antiseptic soap that kills germs and continues to kill germs even after washing.   DO NOT use if you  have an allergy to chlorhexidine/CHG or antibacterial soaps. If your skin becomes reddened or irritated, stop using the CHG and notify one of our RNs at [phone].   Please shower with the CHG soap starting 4 days before surgery using the following schedule:     Please keep in mind the following:  DO NOT shave, including legs and underarms, starting the day of your first shower.   You may shave your face at any point before/day of surgery.  Place clean sheets on your bed the day you start using CHG soap. Use a clean washcloth (not used since being washed) for each shower. DO NOT sleep with pets once you start using the CHG.   CHG Shower Instructions:  If you choose to wash your hair and private area, wash first with your normal shampoo/soap.  After you use  shampoo/soap, rinse your hair and body thoroughly to remove shampoo/soap residue.  Turn the water OFF and apply about 3 tablespoons (45 ml) of CHG soap to a CLEAN washcloth.  Apply CHG soap ONLY FROM YOUR NECK DOWN TO YOUR TOES (washing for 3-5 minutes)  DO NOT use CHG soap on face, private areas, open wounds, or sores.  Pay special attention to the area where your surgery is being performed.  If you are having back surgery, having someone wash your back for you may be helpful. Wait 2 minutes after CHG soap is applied, then you may rinse off the CHG soap.  Pat dry with a clean towel  Put on clean clothes/pajamas   If you choose to wear lotion, please use ONLY the CHG-compatible lotions on the back of this paper.     Additional instructions for the day of surgery: DO NOT APPLY any lotions, deodorants, cologne, or perfumes.   Put on clean/comfortable clothes.  Brush your teeth.  Ask your nurse before applying any prescription medications to the skin.      CHG Compatible Lotions   Aveeno Moisturizing lotion  Cetaphil Moisturizing Cream  Cetaphil Moisturizing Lotion  Clairol Herbal Essence Moisturizing Lotion, Dry Skin  Clairol Herbal Essence Moisturizing Lotion, Extra Dry Skin  Clairol Herbal Essence Moisturizing Lotion, Normal Skin  Curel Age Defying Therapeutic Moisturizing Lotion with Alpha Hydroxy  Curel Extreme Care Body Lotion  Curel Soothing Hands Moisturizing Hand Lotion  Curel Therapeutic Moisturizing Cream, Fragrance-Free  Curel Therapeutic Moisturizing Lotion, Fragrance-Free  Curel Therapeutic Moisturizing Lotion, Original Formula  Eucerin Daily Replenishing Lotion  Eucerin Dry Skin Therapy Plus Alpha Hydroxy Crme  Eucerin Dry Skin Therapy Plus Alpha Hydroxy Lotion  Eucerin Original Crme  Eucerin Original Lotion  Eucerin Plus Crme Eucerin Plus Lotion  Eucerin TriLipid Replenishing Lotion  Keri Anti-Bacterial Hand Lotion  Keri Deep Conditioning Original Lotion Dry  Skin Formula Softly Scented  Keri Deep Conditioning Original Lotion, Fragrance Free Sensitive Skin Formula  Keri Lotion Fast Absorbing Fragrance Free Sensitive Skin Formula  Keri Lotion Fast Absorbing Softly Scented Dry Skin Formula  Keri Original Lotion  Keri Skin Renewal Lotion Keri Silky Smooth Lotion  Keri Silky Smooth Sensitive Skin Lotion  Nivea Body Creamy Conditioning Oil  Nivea Body Extra Enriched Teacher, adult education Moisturizing Lotion Nivea Crme  Nivea Skin Firming Lotion  NutraDerm 30 Skin Lotion  NutraDerm Skin Lotion  NutraDerm Therapeutic Skin Cream  NutraDerm Therapeutic Skin Lotion  ProShield Protective Hand Cream  Provon moisturizing lotion   [facility]- Preparing for Total Shoulder Arthroplasty    Before surgery, you can play  an important role. Because skin is not sterile, your skin needs to be as free of germs as possible. You can reduce the number of germs on your skin by using the following products. Benzoyl Peroxide Gel Reduces the number of germs present on the skin Applied twice a day to shoulder area starting two days before surgery    ==================================================================  Please follow these instructions carefully:  BENZOYL PEROXIDE 5% GEL  Please do not use if you have an allergy to benzoyl peroxide.   If your skin becomes reddened/irritated stop using the benzoyl peroxide.  Starting two days before surgery, apply as follows: Apply benzoyl peroxide in the morning and at night. Apply after taking a shower. If you are not taking a shower clean entire shoulder front, back, and side along with the armpit with a clean wet washcloth.  Place a quarter-sized dollop on your shoulder and rub in thoroughly, making sure to cover the front, back, and side of your shoulder, along with the armpit.   2 days before ____ AM   ____ PM              1 day before ____ AM   ____ PM                          Do this twice a day for two days.  (Last application is the night before surgery, AFTER using the CHG soap as described below).  Do NOT apply benzoyl peroxide gel on the day of surgery.

## 2022-12-22 ENCOUNTER — Ambulatory Visit: Admission: RE | Admit: 2022-12-22 | Payer: BC Managed Care – PPO | Source: Ambulatory Visit

## 2022-12-22 ENCOUNTER — Ambulatory Visit
Admission: RE | Admit: 2022-12-22 | Discharge: 2022-12-22 | Disposition: A | Discharge status: Home / Self Care | Payer: BC Managed Care – PPO | Source: Ambulatory Visit | Attending: Neurology | Admitting: Neurology

## 2022-12-22 DIAGNOSIS — R55 Syncope and collapse: Secondary | ICD-10-CM | POA: Diagnosis present

## 2022-12-23 ENCOUNTER — Other Ambulatory Visit: Payer: Self-pay

## 2022-12-23 ENCOUNTER — Encounter (HOSPITAL_COMMUNITY)
Admission: RE | Admit: 2022-12-23 | Discharge: 2022-12-23 | Disposition: A | Discharge status: Home / Self Care | Payer: BC Managed Care – PPO | Source: Ambulatory Visit | Attending: Orthopedic Surgery | Admitting: Orthopedic Surgery

## 2022-12-23 ENCOUNTER — Encounter (HOSPITAL_COMMUNITY): Payer: Self-pay

## 2022-12-23 DIAGNOSIS — G4733 Obstructive sleep apnea (adult) (pediatric): Secondary | ICD-10-CM | POA: Insufficient documentation

## 2022-12-23 DIAGNOSIS — Z79891 Long term (current) use of opiate analgesic: Secondary | ICD-10-CM | POA: Insufficient documentation

## 2022-12-23 DIAGNOSIS — I10 Essential (primary) hypertension: Secondary | ICD-10-CM | POA: Diagnosis not present

## 2022-12-23 DIAGNOSIS — Z79899 Other long term (current) drug therapy: Secondary | ICD-10-CM | POA: Insufficient documentation

## 2022-12-23 DIAGNOSIS — K219 Gastro-esophageal reflux disease without esophagitis: Secondary | ICD-10-CM | POA: Diagnosis not present

## 2022-12-23 DIAGNOSIS — E119 Type 2 diabetes mellitus without complications: Secondary | ICD-10-CM | POA: Insufficient documentation

## 2022-12-23 DIAGNOSIS — Z9889 Other specified postprocedural states: Secondary | ICD-10-CM | POA: Insufficient documentation

## 2022-12-23 DIAGNOSIS — Z981 Arthrodesis status: Secondary | ICD-10-CM | POA: Insufficient documentation

## 2022-12-23 DIAGNOSIS — Z7984 Long term (current) use of oral hypoglycemic drugs: Secondary | ICD-10-CM | POA: Diagnosis not present

## 2022-12-23 DIAGNOSIS — G894 Chronic pain syndrome: Secondary | ICD-10-CM | POA: Diagnosis not present

## 2022-12-23 DIAGNOSIS — Z01812 Encounter for preprocedural laboratory examination: Secondary | ICD-10-CM | POA: Insufficient documentation

## 2022-12-23 DIAGNOSIS — Z01818 Encounter for other preprocedural examination: Secondary | ICD-10-CM | POA: Diagnosis present

## 2022-12-23 HISTORY — DX: Other specified postprocedural states: Z98.890

## 2022-12-23 HISTORY — DX: Gastro-esophageal reflux disease without esophagitis: K21.9

## 2022-12-23 LAB — CBC
HCT: 41.3 % (ref 39.0–52.0)
Hemoglobin: 13.9 g/dL (ref 13.0–17.0)
MCH: 30.3 pg (ref 26.0–34.0)
MCHC: 33.7 g/dL (ref 30.0–36.0)
MCV: 90 fL (ref 80.0–100.0)
Platelets: 203 10*3/uL (ref 150–400)
RBC: 4.59 MIL/uL (ref 4.22–5.81)
RDW: 12.9 % (ref 11.5–15.5)
WBC: 5.8 10*3/uL (ref 4.0–10.5)
nRBC: 0 % (ref 0.0–0.2)

## 2022-12-23 LAB — SURGICAL PCR SCREEN
MRSA, PCR: NEGATIVE
Staphylococcus aureus: NEGATIVE

## 2022-12-23 LAB — BASIC METABOLIC PANEL
Anion gap: 10 (ref 5–15)
BUN: 21 mg/dL — ABNORMAL HIGH (ref 6–20)
CO2: 22 mmol/L (ref 22–32)
Calcium: 9.6 mg/dL (ref 8.9–10.3)
Chloride: 105 mmol/L (ref 98–111)
Creatinine, Ser: 0.9 mg/dL (ref 0.61–1.24)
GFR, Estimated: >60 mL/min (ref >60)
Glucose, Bld: 125 mg/dL — ABNORMAL HIGH (ref 70–99)
Potassium: 4.4 mmol/L (ref 3.5–5.1)
Sodium: 137 mmol/L (ref 135–145)

## 2022-12-23 LAB — GLUCOSE, CAPILLARY: Glucose-Capillary: 124 mg/dL — ABNORMAL HIGH (ref 70–99)

## 2022-12-26 ENCOUNTER — Encounter (HOSPITAL_COMMUNITY): Payer: Self-pay

## 2022-12-26 NOTE — Progress Notes (Signed)
DISCUSSION: Adam Petty is a 62 yo male who presents to PAT prior to REVERSE SHOULDER ARTHROPLASTY on 12/30/22 with Dr. Ranell Patrick. PMH of HTN, GERD, hx of OSA, T2DM, chronic pain syndrome with narcotic dependence, s/p cervical fusion, arthritis s/p multiple orthopedics surgeries  Prior anesthesia complications include PONV and hx of difficult airway? (Mentioned in anesthesia note from 10/02/2013). No difficulty noted during prior surgery on 08/09/22  Patient was referred to Cardiology for pre op clearance due to fatigue and generalized weakness. He underwent a stress echo on 10/13/22 which was normal. He previously was diagnosed with OSA but had ENT surgery and felt like his symptoms resolved after that. His cardiologist felt that he should get retested for OSA as symptoms seemed consistent with that. He was cleared for surgery:  "Continue current cardiac medications. Encourage low-sodium diet, less than 2000 mg daily. Stress echo negative for ischemia. Discussed results with patient, all questions answered. He may proceed with surgery from a cardiac standpoint. I encourage him to get re-tested for sleep apnea as his symptoms are consistent with OSA."  Patient follows with PCP and Neurology for the generalized weakness as well. Last seen by PCP on 11/20 and was encouraged to have testing done that Neurology was recommending including MRI of brain. MRI was done on 12/22/22 which did not show anything acute. EEG was done 12/20/22 and results were normal.   Clearance signed by PCP dated 10/04/22 (in media tab on 11/24/22)  VS: BP 123/81   Pulse 78   Temp 36.5 C (Oral)   Resp 16   Ht 5\' 6"  (1.676 m)   Wt 103 kg   SpO2 98%   BMI 36.64 kg/m   PROVIDERS: [name], PA-C ([facility]) Cardiology: Clotilde Dieter, DO ([facility])  LABS: Labs reviewed: Acceptable for surgery. (all labs ordered are listed, but only abnormal results are displayed)  Labs Reviewed  GLUCOSE, CAPILLARY - Abnormal; Notable  for the following components:      Result Value   Glucose-Capillary 124 (*)    All other components within normal limits     IMAGES: Brain MRI 12/22/22:  IMPRESSION: 1. No evidence of an acute intracranial abnormality. 2. Multifocal T2 FLAIR hyperintense signal abnormality within the cerebral white matter, mild but greater suspect for age. Findings are nonspecific, but likely reflect sequelae of chronic small vessel ischemic disease given the patient's history of diabetes mellitus, hypertension and hypercholesterolemia. 3. Small-volume fluid within the bilateral mastoid air cells.   EKG 10/04/22 ([facility], report only)  Normal sinus rhythm Low voltage QRS Cannot rule out Anterior infarct , age undetermined  CV:  10/13/2022 STRESS ECHO: NORMAL STRESS ECHOCARDIOGRAM. NORMAL RESTING STUDY WITH NO WALL MOTION ABNORMALITIES AT REST AND PEAK EXERCISE. Maximum workload of 10.1 METs was achieved during exercise. RESTING COMMENTS --------------------------------------------------------------------- NORMAL LA PRESSURES WITH DIASTOLIC DYSFUNCTION VALVULAR REGURGITATION: No AR, TRIVIAL MR, TRIVIAL PR, TRIVIAL TR NO VALVULAR STENOSIS  Past Medical History:  Diagnosis Date   Arthritis, senescent 10/22/2014   Back pain    history spinal stenosis, DDD   Chronic pain syndrome 10/22/2014   DJD (degenerative joint disease)    Dyslipidemia    Essential hypertension    GERD (gastroesophageal reflux disease)    History of kidney stones    Hypercholesterolemia    IBS (irritable bowel syndrome)    Lateral meniscus tear    rt knee   Osteoarthritis    PONV (postoperative nausea and vomiting)    Preventative health care    takes crestor/ altace  for "preventative reasons" denies heart or BP problems   Sleep apnea    had UPPP surgery and now sleep apnea resolved   Spinal stenosis    Type 2 diabetes mellitus without complication [facility])     Past Surgical History:  Procedure Laterality Date    CERVICAL FUSION  1995   COLONOSCOPY  01/24/2003   COLONOSCOPY  01/16/2018   CTR Left 02/27/2018   CTR and trigger finger repair   CYSTOSCOPY W/ RETROGRADES Right 11/19/2015   Procedure: URETEROSCOPY WITH RETROGRADE PYELOGRAM;  Surgeon: Orson Ape, MD;  Location: [facility] ORS;  Service: Urology;  Laterality: Right;   CYSTOSCOPY W/ RETROGRADES Right 10/03/2017   Procedure: CYSTOSCOPY WITH RETROGRADE PYELOGRAM;  Surgeon: Orson Ape, MD;  Location: [facility] OR[facility];  Service: Urology;  Laterality: Right;   CYSTOSCOPY/URETEROSCOPY/HOLMIUM LASER/STENT PLACEMENT Right 08/09/2022   Procedure: CYSTOSCOPY/URETEROSCOPY/HOLMIUM LASER/STENT PLACEMENT;  Surgeon: Riki Altes, MD;  Location: [facility] OR[facility];  Service: Urology;  Laterality: Right;   FOOT SURGERY     RT   HAND SURGERY Left 10/21/2022   trigger finger   IRRIGATION AND DEBRIDEMENT KNEE Right 03/08/2022   Procedure: IRRIGATION AND DEBRIDEMENT RIGHT KNEE;  Surgeon: Lyndle Herrlich, MD;  Location: [facility] OR[facility];  Service: Orthopedics;  Laterality: Right;   KNEE ARTHROSCOPY Right 10/02/2013   Procedure: RIGHT KNEE ARTHROSCOPY, PARTIAL LATERAL MENISCECTOMY, DEBRIDEMENT, MEDIAL AND LATERAL CHONDROPLASTY;  Surgeon: Loanne Drilling, MD;  Location: [facility];  Service: Orthopedics;  Laterality: Right;   KNEE ARTHROSCOPY WITH MEDIAL MENISECTOMY Right 03/25/2015   Procedure: RIGHT KNEE ARTHROSCOPY WITH MENISCAL DEBRIDEMENT and chrodroplasty;  Surgeon: Ollen Gross, MD;  Location: [facility];  Service: Orthopedics;  Laterality: Right;   KNEE BURSECTOMY Right 03/25/2015   Procedure: RIGHT KNEE PREPATELLA BURSECTOMY;  Surgeon: Ollen Gross, MD;  Location: [facility];  Service: Orthopedics;  Laterality: Right;   LUMBAR LAMINECTOMY/DECOMPRESSION MICRODISCECTOMY N/A 02/06/2013   Procedure: MICRO LUMBAR DECOMPRESSION L3-L4 and L4 - L5 ;  Surgeon: Javier Docker, MD;  Location: [facility];  Service: Orthopedics;  Laterality: N/A;   PALATE SURGERY     surg for sleep apnea and sinus  issues   ROTATOR CUFF REPAIR Right    x3   ROTATOR CUFF REPAIR Left    SHOULDER SURGERY Right    spur removal x2   TONSILLECTOMY     TOTAL KNEE ARTHROPLASTY Right 12/28/2021   Procedure: TOTAL KNEE ARTHROPLASTY;  Surgeon: Lyndle Herrlich, MD;  Location: [facility];  Service: Orthopedics;  Laterality: Right;   URETEROSCOPY Right 10/03/2017   Procedure: URETEROSCOPY;  Surgeon: Orson Ape, MD;  Location: [facility];  Service: Urology;  Laterality: Right;    MEDICATIONS:  cyanocobalamin (,VITAMIN B-12,) 1000 MCG/ML injection   docusate sodium (COLACE) 100 MG capsule   gabapentin (NEURONTIN) 600 MG tablet   ibuprofen (ADVIL) 600 MG tablet   metFORMIN (GLUCOPHAGE) 1000 MG tablet   naloxone (NARCAN) nasal spray 4 mg/0.1 mL   naproxen sodium (ALEVE) 220 MG tablet   oxyCODONE (OXY IR/ROXICODONE) 5 MG immediate release tablet   oxyCODONE (OXY IR/ROXICODONE) 5 MG immediate release tablet   [START ON 12/31/2022] oxyCODONE (OXY IR/ROXICODONE) 5 MG immediate release tablet   polyethylene glycol (MIRALAX / GLYCOLAX) 17 g packet   rosuvastatin (CRESTOR) 5 MG tablet   Semaglutide, 2 MG/DOSE, (OZEMPIC, 2 MG/DOSE,) 8 MG/3ML SOPN   No current facility-administered medications for this encounter.   Marcille Blanco [facility] Short Stay/Anesthesiology [facility] Phone [phone] 12/26/2022 1:57 PM

## 2022-12-26 NOTE — Anesthesia Preprocedure Evaluation (Addendum)
Anesthesia Evaluation  Patient identified by MRN, date of birth, ID band Patient awake    Reviewed: Allergy & Precautions, NPO status , Patient's Chart, lab work & pertinent test results  History of Anesthesia Complications (+) PONV, DIFFICULT AIRWAY and history of anesthetic complications (? hx of difficult airway)  Airway Mallampati: III  TM Distance: >3 FB Neck ROM: Full    Dental no notable dental hx. (+) Teeth Intact, Dental Advisory Given   Pulmonary    Pulmonary exam normal breath sounds clear to auscultation       Cardiovascular hypertension, Pt. on medications Normal cardiovascular exam Rhythm:Regular Rate:Normal     Neuro/Psych  Headaches  Neuromuscular disease    GI/Hepatic ,GERD  ,,  Endo/Other  diabetes, Type 2    Renal/GU Renal diseaseLab Results      Component                Value               Date                      NA                       137                 12/23/2022                CL                       105                 12/23/2022                K                        4.4                 12/23/2022                CO2                      22                  12/23/2022                BUN                      21 (H)              12/23/2022                CREATININE               0.90                12/23/2022                GFRNONAA                 >60                 12/23/2022                CALCIUM                  9.6  12/23/2022                ALBUMIN                  3.8                 12/27/2020                GLUCOSE                  125 (H)             12/23/2022                Musculoskeletal  (+) Arthritis ,  Chronic Pain syndrome w narcotic dependence   Abdominal  (+) + obese (BMI 36.7)  Peds  Hematology Lab Results      Component                Value               Date                      WBC                      5.8                 12/23/2022                 HGB                      13.9                12/23/2022                HCT                      41.3                12/23/2022                MCV                      90.0                12/23/2022                PLT                      203                 12/23/2022              Anesthesia Other Findings   Reproductive/Obstetrics                              Anesthesia Physical Anesthesia Plan  ASA: 3  Anesthesia Plan: General and Regional   Post-op Pain Management: Minimal or no pain anticipated, Regional block* and Toradol IV (intra-op)*   Induction: Intravenous  PONV Risk Score and Plan: 4 or greater and Treatment may vary due to age or medical condition, Ondansetron, Midazolam, Dexamethasone and Scopolamine patch - Pre-op  Airway Management Planned: Oral ETT and Video Laryngoscope Planned  Additional Equipment: None  Intra-op Plan:   Post-operative Plan: Extubation in OR  Informed Consent: I have reviewed the patients History and  Physical, chart, labs and discussed the procedure including the risks, benefits and alternatives for the proposed anesthesia with the patient or authorized representative who has indicated his/her understanding and acceptance.     Dental advisory given  Plan Discussed with: CRNA and Anesthesiologist  Anesthesia Plan Comments: (See PAT note from 12/6 by K Gekas PA-C GA w R ISB exparel)         Anesthesia Quick Evaluation

## 2022-12-30 ENCOUNTER — Ambulatory Visit (HOSPITAL_COMMUNITY): Payer: BC Managed Care – PPO | Admitting: Medical

## 2022-12-30 ENCOUNTER — Other Ambulatory Visit: Payer: Self-pay

## 2022-12-30 ENCOUNTER — Encounter (HOSPITAL_COMMUNITY)
Admission: RE | Disposition: A | Discharge status: Home / Self Care | Payer: Self-pay | Source: Home / Self Care | Attending: Orthopedic Surgery

## 2022-12-30 ENCOUNTER — Ambulatory Visit (HOSPITAL_COMMUNITY)
Admission: RE | Admit: 2022-12-30 | Discharge: 2022-12-30 | Disposition: A | Discharge status: Home / Self Care | Payer: BC Managed Care – PPO | Attending: Orthopedic Surgery | Admitting: Orthopedic Surgery

## 2022-12-30 ENCOUNTER — Encounter (HOSPITAL_COMMUNITY): Payer: Self-pay | Admitting: Orthopedic Surgery

## 2022-12-30 ENCOUNTER — Ambulatory Visit (HOSPITAL_COMMUNITY): Payer: BC Managed Care – PPO | Admitting: Anesthesiology

## 2022-12-30 ENCOUNTER — Ambulatory Visit (HOSPITAL_COMMUNITY): Payer: BC Managed Care – PPO

## 2022-12-30 ENCOUNTER — Ambulatory Visit: Payer: BC Managed Care – PPO | Admitting: Cardiology

## 2022-12-30 DIAGNOSIS — E119 Type 2 diabetes mellitus without complications: Secondary | ICD-10-CM | POA: Insufficient documentation

## 2022-12-30 DIAGNOSIS — I10 Essential (primary) hypertension: Secondary | ICD-10-CM | POA: Diagnosis not present

## 2022-12-30 DIAGNOSIS — Z7985 Long-term (current) use of injectable non-insulin antidiabetic drugs: Secondary | ICD-10-CM | POA: Insufficient documentation

## 2022-12-30 DIAGNOSIS — M25711 Osteophyte, right shoulder: Secondary | ICD-10-CM | POA: Insufficient documentation

## 2022-12-30 DIAGNOSIS — Z7984 Long term (current) use of oral hypoglycemic drugs: Secondary | ICD-10-CM | POA: Diagnosis not present

## 2022-12-30 DIAGNOSIS — Z01818 Encounter for other preprocedural examination: Secondary | ICD-10-CM

## 2022-12-30 DIAGNOSIS — M19011 Primary osteoarthritis, right shoulder: Secondary | ICD-10-CM | POA: Diagnosis present

## 2022-12-30 DIAGNOSIS — K219 Gastro-esophageal reflux disease without esophagitis: Secondary | ICD-10-CM | POA: Diagnosis not present

## 2022-12-30 HISTORY — PX: REVERSE SHOULDER ARTHROPLASTY: SHX5054

## 2022-12-30 LAB — GLUCOSE, CAPILLARY
Glucose-Capillary: 128 mg/dL — ABNORMAL HIGH (ref 70–99)
Glucose-Capillary: 160 mg/dL — ABNORMAL HIGH (ref 70–99)

## 2022-12-30 SURGERY — ARTHROPLASTY, SHOULDER, TOTAL, REVERSE
Anesthesia: Regional | Site: Shoulder | Laterality: Right

## 2022-12-30 MED ORDER — METHOCARBAMOL 500 MG PO TABS: 500.0000 mg | ORAL_TABLET | Freq: Four times a day (QID) | ORAL | Status: DC | PRN

## 2022-12-30 MED ORDER — KETOROLAC TROMETHAMINE 30 MG/ML IJ SOLN
INTRAMUSCULAR | Status: AC
  Filled 2022-12-30: qty 1

## 2022-12-30 MED ORDER — MIDAZOLAM HCL 2 MG/2ML IJ SOLN
INTRAMUSCULAR | Status: DC | PRN
  Administered 2022-12-30 (×2): 1 mg via INTRAVENOUS

## 2022-12-30 MED ORDER — CEFAZOLIN SODIUM-DEXTROSE 2-4 GM/100ML-% IV SOLN
2.0000 g | INTRAVENOUS | Status: AC
  Administered 2022-12-30: 2 g via INTRAVENOUS
  Filled 2022-12-30: qty 100

## 2022-12-30 MED ORDER — CEFAZOLIN SODIUM-DEXTROSE 2-4 GM/100ML-% IV SOLN: 2.0000 g | Freq: Four times a day (QID) | INTRAVENOUS | Status: DC

## 2022-12-30 MED ORDER — MIDAZOLAM HCL 2 MG/2ML IJ SOLN
INTRAMUSCULAR | Status: AC
  Filled 2022-12-30: qty 2

## 2022-12-30 MED ORDER — BUPIVACAINE LIPOSOME 1.3 % IJ SUSP
INTRAMUSCULAR | Status: DC | PRN
  Administered 2022-12-30: 10 mL via PERINEURAL

## 2022-12-30 MED ORDER — CHLORHEXIDINE GLUCONATE 0.12 % MT SOLN: 15.0000 mL | Freq: Once | OROMUCOSAL | Status: DC

## 2022-12-30 MED ORDER — STERILE WATER FOR IRRIGATION IR SOLN
Status: DC | PRN
  Administered 2022-12-30: 1000 mL

## 2022-12-30 MED ORDER — ONDANSETRON HCL 4 MG/2ML IJ SOLN
INTRAMUSCULAR | Status: DC | PRN
  Administered 2022-12-30: 4 mg via INTRAVENOUS

## 2022-12-30 MED ORDER — FENTANYL CITRATE (PF) 250 MCG/5ML IJ SOLN
INTRAMUSCULAR | Status: DC | PRN
  Administered 2022-12-30 (×2): 50 ug via INTRAVENOUS

## 2022-12-30 MED ORDER — CHLORHEXIDINE GLUCONATE 0.12 % MT SOLN
15.0000 mL | Freq: Once | OROMUCOSAL | Status: AC
  Administered 2022-12-30: 15 mL via OROMUCOSAL

## 2022-12-30 MED ORDER — METHOCARBAMOL 500 MG PO TABS: 500.0000 mg | ORAL_TABLET | Freq: Three times a day (TID) | ORAL | 1 refills | Status: AC | PRN

## 2022-12-30 MED ORDER — HYDROMORPHONE HCL 1 MG/ML IJ SOLN: 0.2500 mg | INTRAMUSCULAR | Status: DC | PRN

## 2022-12-30 MED ORDER — INSULIN ASPART 100 UNIT/ML IJ SOLN
0.0000 [IU] | INTRAMUSCULAR | Status: DC | PRN
Start: 2022-12-30 — End: 2022-12-30

## 2022-12-30 MED ORDER — PROPOFOL 10 MG/ML IV BOLUS
INTRAVENOUS | Status: AC
  Filled 2022-12-30: qty 20

## 2022-12-30 MED ORDER — ONDANSETRON HCL 4 MG/2ML IJ SOLN: 4.0000 mg | Freq: Four times a day (QID) | INTRAMUSCULAR | Status: DC | PRN

## 2022-12-30 MED ORDER — ONDANSETRON HCL 4 MG PO TABS: 4.0000 mg | ORAL_TABLET | Freq: Three times a day (TID) | ORAL | 1 refills | Status: DC | PRN

## 2022-12-30 MED ORDER — ONDANSETRON HCL 4 MG/2ML IJ SOLN
INTRAMUSCULAR | Status: AC
  Filled 2022-12-30: qty 2

## 2022-12-30 MED ORDER — METHOCARBAMOL 1000 MG/10ML IJ SOLN: 500.0000 mg | Freq: Four times a day (QID) | INTRAMUSCULAR | Status: DC | PRN

## 2022-12-30 MED ORDER — FENTANYL CITRATE (PF) 100 MCG/2ML IJ SOLN
INTRAMUSCULAR | Status: AC
  Filled 2022-12-30: qty 2

## 2022-12-30 MED ORDER — HYDROMORPHONE HCL 2 MG PO TABS: 2.0000 mg | ORAL_TABLET | ORAL | 0 refills | Status: AC | PRN

## 2022-12-30 MED ORDER — TRANEXAMIC ACID-NACL 1000-0.7 MG/100ML-% IV SOLN
INTRAVENOUS | Status: AC
  Filled 2022-12-30: qty 100

## 2022-12-30 MED ORDER — EPINEPHRINE PF 1 MG/ML IJ SOLN
INTRAMUSCULAR | Status: AC
  Filled 2022-12-30: qty 1

## 2022-12-30 MED ORDER — METOCLOPRAMIDE HCL 5 MG/ML IJ SOLN: 5.0000 mg | Freq: Three times a day (TID) | INTRAMUSCULAR | Status: DC | PRN

## 2022-12-30 MED ORDER — BUPIVACAINE HCL 0.25 % IJ SOLN
INTRAMUSCULAR | Status: AC
  Filled 2022-12-30: qty 1

## 2022-12-30 MED ORDER — SCOPOLAMINE 1 MG/3DAYS TD PT72
1.0000 | MEDICATED_PATCH | TRANSDERMAL | Status: DC
  Administered 2022-12-30: 1.5 mg via TRANSDERMAL
  Filled 2022-12-30: qty 1

## 2022-12-30 MED ORDER — BUPIVACAINE-EPINEPHRINE (PF) 0.25% -1:200000 IJ SOLN
INTRAMUSCULAR | Status: DC | PRN
  Administered 2022-12-30: 19 mL via PERINEURAL

## 2022-12-30 MED ORDER — ACETAMINOPHEN 10 MG/ML IV SOLN: 1000.0000 mg | Freq: Once | INTRAVENOUS | Status: DC | PRN

## 2022-12-30 MED ORDER — TRANEXAMIC ACID-NACL 1000-0.7 MG/100ML-% IV SOLN
1000.0000 mg | Freq: Once | INTRAVENOUS | Status: AC
  Administered 2022-12-30: 1000 mg via INTRAVENOUS

## 2022-12-30 MED ORDER — BUPIVACAINE HCL (PF) 0.5 % IJ SOLN
INTRAMUSCULAR | Status: DC | PRN
  Administered 2022-12-30: 15 mL via PERINEURAL

## 2022-12-30 MED ORDER — LACTATED RINGERS IV SOLN
INTRAVENOUS | Status: DC
Start: 2022-12-30 — End: 2022-12-30

## 2022-12-30 MED ORDER — PROPOFOL 10 MG/ML IV BOLUS
INTRAVENOUS | Status: DC | PRN
  Administered 2022-12-30: 180 mg via INTRAVENOUS

## 2022-12-30 MED ORDER — OXYCODONE HCL 5 MG/5ML PO SOLN: 5.0000 mg | Freq: Once | ORAL | Status: DC | PRN

## 2022-12-30 MED ORDER — ONDANSETRON HCL 4 MG/2ML IJ SOLN: 4.0000 mg | Freq: Once | INTRAMUSCULAR | Status: DC | PRN

## 2022-12-30 MED ORDER — SUCCINYLCHOLINE CHLORIDE 200 MG/10ML IV SOSY
PREFILLED_SYRINGE | INTRAVENOUS | Status: DC | PRN
  Administered 2022-12-30: 100 mg via INTRAVENOUS

## 2022-12-30 MED ORDER — AMISULPRIDE (ANTIEMETIC) 5 MG/2ML IV SOLN
10.0000 mg | Freq: Once | INTRAVENOUS | Status: AC | PRN
  Administered 2022-12-30: 10 mg via INTRAVENOUS

## 2022-12-30 MED ORDER — 0.9 % SODIUM CHLORIDE (POUR BTL) OPTIME
TOPICAL | Status: DC | PRN
  Administered 2022-12-30: 1000 mL

## 2022-12-30 MED ORDER — LIDOCAINE HCL (PF) 2 % IJ SOLN
INTRAMUSCULAR | Status: AC
  Filled 2022-12-30: qty 5

## 2022-12-30 MED ORDER — SODIUM CHLORIDE 0.9 % IV SOLN: INTRAVENOUS | Status: DC

## 2022-12-30 MED ORDER — METOCLOPRAMIDE HCL 5 MG PO TABS: 5.0000 mg | ORAL_TABLET | Freq: Three times a day (TID) | ORAL | Status: DC | PRN

## 2022-12-30 MED ORDER — SUCCINYLCHOLINE CHLORIDE 200 MG/10ML IV SOSY
PREFILLED_SYRINGE | INTRAVENOUS | Status: AC
  Filled 2022-12-30: qty 10

## 2022-12-30 MED ORDER — DEXAMETHASONE SODIUM PHOSPHATE 10 MG/ML IJ SOLN
INTRAMUSCULAR | Status: DC | PRN
  Administered 2022-12-30: 10 mg via INTRAVENOUS

## 2022-12-30 MED ORDER — PHENYLEPHRINE HCL-NACL 20-0.9 MG/250ML-% IV SOLN
INTRAVENOUS | Status: DC | PRN
  Administered 2022-12-30: 50 ug/min via INTRAVENOUS

## 2022-12-30 MED ORDER — OXYCODONE HCL 5 MG PO TABS: 5.0000 mg | ORAL_TABLET | Freq: Once | ORAL | Status: DC | PRN

## 2022-12-30 MED ORDER — LIDOCAINE 2% (20 MG/ML) 5 ML SYRINGE
INTRAMUSCULAR | Status: DC | PRN
  Administered 2022-12-30: 100 mg via INTRAVENOUS

## 2022-12-30 MED ORDER — TRANEXAMIC ACID-NACL 1000-0.7 MG/100ML-% IV SOLN
1000.0000 mg | INTRAVENOUS | Status: AC
  Administered 2022-12-30: 1000 mg via INTRAVENOUS
  Filled 2022-12-30: qty 100

## 2022-12-30 MED ORDER — ONDANSETRON HCL 4 MG PO TABS: 4.0000 mg | ORAL_TABLET | Freq: Four times a day (QID) | ORAL | Status: DC | PRN

## 2022-12-30 MED ORDER — AMISULPRIDE (ANTIEMETIC) 5 MG/2ML IV SOLN
INTRAVENOUS | Status: AC
  Filled 2022-12-30: qty 4

## 2022-12-30 MED ORDER — DEXAMETHASONE SODIUM PHOSPHATE 10 MG/ML IJ SOLN
INTRAMUSCULAR | Status: AC
Start: 2022-12-30 — End: ?
  Filled 2022-12-30: qty 1

## 2022-12-30 MED ORDER — ORAL CARE MOUTH RINSE: 15.0000 mL | Freq: Once | OROMUCOSAL | Status: DC

## 2022-12-30 MED ORDER — HYDROMORPHONE HCL 1 MG/ML IJ SOLN: 0.5000 mg | INTRAMUSCULAR | Status: DC | PRN

## 2022-12-30 MED ORDER — ORAL CARE MOUTH RINSE: 15.0000 mL | Freq: Once | OROMUCOSAL | Status: AC

## 2022-12-30 SURGICAL SUPPLY — 60 items
BAG COUNTER SPONGE SURGICOUNT (BAG) IMPLANT
BAG ZIPLOCK 12X15 (MISCELLANEOUS) IMPLANT
BIT DRILL 1.6MX128 (BIT) IMPLANT
BIT DRILL 170X2.5X (BIT) IMPLANT
BIT DRL 170X2.5X (BIT) ×1
BLADE SAG 18X100X1.27 (BLADE) ×1 IMPLANT
COVER BACK TABLE 60X90IN (DRAPES) ×1 IMPLANT
COVER SURGICAL LIGHT HANDLE (MISCELLANEOUS) ×1 IMPLANT
CUP HUMERAL 42 PLUS 3 (Orthopedic Implant) IMPLANT
DRAPE INCISE IOBAN 66X45 STRL (DRAPES) ×1 IMPLANT
DRAPE SHEET LG 3/4 BI-LAMINATE (DRAPES) ×1 IMPLANT
DRAPE SURG ORHT 6 SPLT 77X108 (DRAPES) ×2 IMPLANT
DRAPE TOP 10253 STERILE (DRAPES) ×1 IMPLANT
DRAPE U-SHAPE 47X51 STRL (DRAPES) ×1 IMPLANT
DRSG ADAPTIC 3X8 NADH LF (GAUZE/BANDAGES/DRESSINGS) ×1 IMPLANT
DURAPREP 26ML APPLICATOR (WOUND CARE) ×1 IMPLANT
ELECT BLADE TIP CTD 4 INCH (ELECTRODE) ×1 IMPLANT
ELECT NDL TIP 2.8 STRL (NEEDLE) ×1 IMPLANT
ELECT NEEDLE TIP 2.8 STRL (NEEDLE) ×1 IMPLANT
ELECT REM PT RETURN 15FT ADLT (MISCELLANEOUS) ×1 IMPLANT
EPIPHYSI RIGHT SZ 2 (Shoulder) ×1 IMPLANT
EPIPHYSIS RIGHT SZ 2 (Shoulder) IMPLANT
FACESHIELD WRAPAROUND (MASK) ×1 IMPLANT
FACESHIELD WRAPAROUND OR TEAM (MASK) ×1 IMPLANT
GAUZE PAD ABD 8X10 STRL (GAUZE/BANDAGES/DRESSINGS) ×1 IMPLANT
GAUZE SPONGE 4X4 12PLY STRL (GAUZE/BANDAGES/DRESSINGS) ×1 IMPLANT
GLENOSPHERE XTEND LAT 42+0 STD (Miscellaneous) IMPLANT
GLOVE BIOGEL PI IND STRL 7.5 (GLOVE) ×1 IMPLANT
GLOVE BIOGEL PI IND STRL 8.5 (GLOVE) ×1 IMPLANT
GLOVE ORTHO TXT STRL SZ7.5 (GLOVE) ×1 IMPLANT
GLOVE SURG ORTHO 8.5 STRL (GLOVE) ×1 IMPLANT
GOWN STRL REUS W/ TWL XL LVL3 (GOWN DISPOSABLE) ×2 IMPLANT
KIT BASIN OR (CUSTOM PROCEDURE TRAY) ×1 IMPLANT
KIT TURNOVER KIT A (KITS) IMPLANT
MANIFOLD NEPTUNE II (INSTRUMENTS) ×1 IMPLANT
METAGLENE DELTA EXTEND (Trauma) IMPLANT
METAGLENE DXTEND (Trauma) ×1 IMPLANT
NDL MAYO CATGUT SZ4 TPR NDL (NEEDLE) IMPLANT
NEEDLE MAYO CATGUT SZ4 (NEEDLE) IMPLANT
NS IRRIG 1000ML POUR BTL (IV SOLUTION) ×1 IMPLANT
PACK SHOULDER (CUSTOM PROCEDURE TRAY) ×1 IMPLANT
PIN GUIDE 1.2 (PIN) IMPLANT
PIN GUIDE GLENOPHERE 1.5MX300M (PIN) IMPLANT
PIN METAGLENE 2.5 (PIN) IMPLANT
RESTRAINT HEAD UNIVERSAL NS (MISCELLANEOUS) ×1 IMPLANT
SCREW 4.5X36MM (Screw) IMPLANT
SCREW BN 18X4.5XSTRL SHLDR (Screw) IMPLANT
SCREW LOCK 42 (Screw) IMPLANT
SLING ARM FOAM STRAP LRG (SOFTGOODS) IMPLANT
SPIKE FLUID TRANSFER (MISCELLANEOUS) ×1 IMPLANT
SPONGE T-LAP 4X18 ~~LOC~~+RFID (SPONGE) IMPLANT
STEM 12 HA (Stem) IMPLANT
STRIP CLOSURE SKIN 1/2X4 (GAUZE/BANDAGES/DRESSINGS) ×1 IMPLANT
SUT FIBERWIRE #2 38 T-5 BLUE (SUTURE) ×1
SUT MNCRL AB 4-0 PS2 18 (SUTURE) ×1 IMPLANT
SUT VIC AB 0 CT1 36 (SUTURE) ×1 IMPLANT
SUT VIC AB 0 CT2 27 (SUTURE) ×1 IMPLANT
SUT VIC AB 2-0 CT1 TAPERPNT 27 (SUTURE) ×1 IMPLANT
SUTURE FIBERWR #2 38 T-5 BLUE (SUTURE) ×1 IMPLANT
TOWEL OR 17X26 10 PK STRL BLUE (TOWEL DISPOSABLE) ×1 IMPLANT

## 2022-12-30 NOTE — Anesthesia Procedure Notes (Signed)
Procedure Name: Intubation Date/Time: 12/30/2022 7:30 AM  Performed by: Elyn Peers, CRNAPre-anesthesia Checklist: Patient identified, Emergency Drugs available, Suction available, Patient being monitored and Timeout performed Patient Re-evaluated:Patient Re-evaluated prior to induction Oxygen Delivery Method: Circle system utilized Preoxygenation: Pre-oxygenation with 100% oxygen Induction Type: IV induction Laryngoscope Size: 3 and Glidescope Grade View: Grade I Tube type: Oral Tube size: 7.5 mm Number of attempts: 1 Airway Equipment and Method: Rigid stylet and Video-laryngoscopy Placement Confirmation: ETT inserted through vocal cords under direct vision, positive ETCO2 and breath sounds checked- equal and bilateral Secured at: 23 cm Tube secured with: Tape Dental Injury: Teeth and Oropharynx as per pre-operative assessment  Comments: Elective glidescope intubation based on patient's history of cervical neck fusion and past intubation.   Full view of cords - atraumatic intubation.

## 2022-12-30 NOTE — Anesthesia Postprocedure Evaluation (Signed)
Anesthesia Post Note  Patient: Adam Petty.  Procedure(s) Performed: REVERSE SHOULDER ARTHROPLASTY (Right: Shoulder)     Patient location during evaluation: PACU Anesthesia Type: Regional and General Level of consciousness: awake and alert Pain management: pain level controlled Vital Signs Assessment: post-procedure vital signs reviewed and stable Respiratory status: spontaneous breathing, nonlabored ventilation, respiratory function stable and patient connected to nasal cannula oxygen Cardiovascular status: blood pressure returned to baseline and stable Postop Assessment: no apparent nausea or vomiting Anesthetic complications: no   No notable events documented.  Last Vitals:  Vitals:   12/30/22 1030 12/30/22 1051  BP: 116/72 136/75  Pulse: 75 80  Resp: 14 14  Temp:  36.6 C  SpO2: 92% 93%    Last Pain:  Vitals:   12/30/22 1051  TempSrc: Oral  PainSc: 0-No pain                 Trevor Iha

## 2022-12-30 NOTE — Transfer of Care (Signed)
Immediate Anesthesia Transfer of Care Note  Patient: Adam Petty.  Procedure(s) Performed: REVERSE SHOULDER ARTHROPLASTY (Right: Shoulder)  Patient Location: PACU  Anesthesia Type:GA combined with regional for post-op pain  Level of Consciousness: awake, alert , and patient cooperative  Airway & Oxygen Therapy: Patient Spontanous Breathing and Patient connected to face mask oxygen  Post-op Assessment: Report given to RN and Post -op Vital signs reviewed and stable  Post vital signs: Reviewed and stable  Last Vitals:  Vitals Value Taken Time  BP 133/70 12/30/22 0919  Temp    Pulse 80 12/30/22 0921  Resp 15 12/30/22 0921  SpO2 100 % 12/30/22 0921  Vitals shown include unfiled device data.  Last Pain:  Vitals:   12/30/22 0626  TempSrc: Oral  PainSc:          Complications: No notable events documented.

## 2022-12-30 NOTE — Anesthesia Procedure Notes (Signed)
Anesthesia Regional Block: Interscalene brachial plexus block   Pre-Anesthetic Checklist: , timeout performed,  Correct Patient, Correct Site, Correct Laterality,  Correct Procedure, Correct Position, site marked,  Risks and benefits discussed,  Surgical consent,  Pre-op evaluation,  At surgeon's request and post-op pain management  Laterality: Upper  Prep: Maximum Sterile Barrier Precautions used, chloraprep       Needles:  Injection technique: Single-shot  Needle Type: Echogenic Needle     Needle Length: 5cm  Needle Gauge: 21     Additional Needles:   Procedures:,,,, ultrasound used (permanent image in chart),,    Narrative:  Start time: 12/30/2022 6:45 AM End time: 12/30/2022 6:55 AM Injection made incrementally with aspirations every 5 mL.  Performed by: Personally  Anesthesiologist: Trevor Iha, MD  Additional Notes: Block assessed prior to procedure. Patient tolerated procedure well.

## 2022-12-30 NOTE — Discharge Instructions (Signed)
Ice to the shoulder constantly.  Keep the incision covered and clean and dry for one week, then ok to get it wet in the shower. Change bandage to the Aquacel dressing on Monday. Leave that on until Friday and then remove. Leave open to air after that.   Do exercise as instructed several times per day.  DO NOT reach behind your back or push up out of a chair with the operative arm.  Use a sling while you are up and around for comfort, may remove while seated.  Keep pillow propped behind the operative elbow. This will keep your elbow where you can see it!  Follow up with Dr Ranell Patrick in two weeks in the office, call [phone] for appt  Please call Dr Kieara Schwark(cell) with any questions or concerns :  [phone]

## 2022-12-30 NOTE — Interval H&P Note (Signed)
History and Physical Interval Note:  12/30/2022 7:12 AM  Adam Petty.  has presented today for surgery, with the diagnosis of Right shoulder rotataor cuff , osteoarthritis.  The various methods of treatment have been discussed with the patient and family. After consideration of risks, benefits and other options for treatment, the patient has consented to  Procedure(s) with comments: REVERSE SHOULDER ARTHROPLASTY (Right) - interscalene block 120 as a surgical intervention.  The patient's history has been reviewed, patient examined, no change in status, stable for surgery.  I have reviewed the patient's chart and labs.  Questions were answered to the patient's satisfaction.     Verlee Rossetti

## 2022-12-30 NOTE — Evaluation (Addendum)
Occupational Therapy Evaluation Patient Details Name: Adam Petty. MRN: [id number] DOB: December 04, 1960 Today's Date: 12/30/2022   History of Present Illness Adam Petty is a 62 yr old male who is s/p R reverse total shoulder arthroplasty on 12-30-22, due to end stage OA and rotator cuff insufficiency.   Clinical Impression   Pt is s/p shoulder replacement of right dominant upper extremity on 12-30-22.  Therapist provided education and instruction to patient and spouse with regards to ROM/exercises, post-op precautions, UE and sling positioning, donning upper extremity clothing, recommendations for bathing while maintaining shoulder precautions, use of ice for pain and edema management, NWB status, sling wear schedule, and donning/doffing sling. Patient and spouse verbalized and demonstrated as needed. Patient needed assistance to donn shirt, underwear, pants, and shoes with instruction on compensatory strategies to perform ADLs. Patient to follow up with MD for further therapy needs.         If plan is discharge home, recommend the following: Assistance with cooking/housework;Assist for transportation;A little help with bathing/dressing/bathroom    Functional Status Assessment  Patient has had a recent decline in their functional status and demonstrates the ability to make significant improvements in function in a reasonable and predictable amount of time.  Equipment Recommendations  None recommended by OT    Recommendations for Other Services       Precautions / Restrictions Precautions Precautions: Shoulder Type of Shoulder Precautions: sling on at all times except ADLs/exercise, okay to perform elbow, wrist, and hand ROM, no shoulder ROM Shoulder Interventions: Shoulder sling/immobilizer Precaution Booklet Issued: Yes (comment) Required Braces or Orthoses: Sling Splint/Cast - Date Prophylactic Dressing Applied (if applicable): 12/30/22 Restrictions Weight Bearing  Restrictions Per Provider Order: Yes (L UE NWB)      Mobility Bed Mobility      General bed mobility comments: pt was received seated in chair    Transfers Overall transfer level: Needs assistance   Transfers: Sit to/from Stand Sit to Stand: Supervision       Balance Overall balance assessment: No apparent balance deficits (not formally assessed)           ADL either performed or assessed with clinical judgement       Pertinent Vitals/Pain Pain Assessment Pain Assessment: No/denies pain     Extremity/Trunk Assessment Upper Extremity Assessment Upper Extremity Assessment: Right hand dominant           Communication Communication Communication: No apparent difficulties   Cognition Arousal: Alert Behavior During Therapy: WFL for tasks assessed/performed Overall Cognitive Status: Within Functional Limits for tasks assessed              Shoulder Instructions Shoulder Instructions Donning/doffing shirt without moving shoulder: Minimal assistance (with pt's spouse performing and receiving instruction from OT as needed) Method for sponge bathing under operated UE: Patient able to independently direct caregiver Donning/doffing sling/immobilizer: Minimal assistance (with pt's spouse performing and receiving instruction from OT as needed) Correct positioning of sling/immobilizer: Minimal assistance (with pt's spouse performing and receiving instruction from OT as needed) Pendulum exercises (written home exercise program):  (N/A) ROM for elbow, wrist and digits of operated UE: Patient able to independently direct caregiver Sling wearing schedule (on at all times/off for ADL's): Patient able to independently direct caregiver Proper positioning of operated UE when showering: Patient able to independently direct caregiver Dressing change:  (Pt and spouse verbalized understanding) Positioning of UE while sleeping: Patient able to independently direct caregiver     Home Living Family/patient expects to be discharged to::  Private residence Living Arrangements: Spouse/significant other (and Mother-in-Law) Available Help at Discharge: Family Type of Home: House Home Access: Stairs to enter Secretary/administrator of Steps: 1   Home Layout: One level     Bathroom Shower/Tub: Tub/shower unit         Home Equipment: Agricultural consultant (2 wheels);Cane - single point          Prior Functioning/Environment Prior Level of Function : Independent/Modified Independent;Driving;Working/employed             Mobility Comments: He was independent with ambulation. ADLs Comments: He was independent with ADLs.        OT Problem List: Impaired UE functional use      OT Treatment/Interventions:   No further OT treatment needs identified in the acute care setting      OT Frequency:  N/A       AM-PAC OT "6 Clicks" Daily Activity     Outcome Measure Help from another person eating meals?: None Help from another person taking care of personal grooming?: None Help from another person toileting, which includes using toliet, bedpan, or urinal?: None Help from another person bathing (including washing, rinsing, drying)?: A Little Help from another person to put on and taking off regular upper body clothing?: A Little Help from another person to put on and taking off regular lower body clothing?: A Little 6 Click Score: 21   End of Session Equipment Utilized During Treatment: Other (comment) (N/A) Nurse Communication: Other (comment) (shoulder education completed)  Activity Tolerance: Patient tolerated treatment well Patient left: in chair;with call bell/phone within reach;with family/visitor present  OT Visit Diagnosis: Muscle weakness (generalized) (M62.81)                Time: 1123-1140 OT Time Calculation (min): 17 min Charges:  OT General Charges $OT Visit: 1 Visit OT Evaluation $OT Eval Moderate Complexity: 1 Mod    Camerin Jimenez L Danyael Alipio,  OTR/L 12/30/2022, 1:15 PM

## 2022-12-30 NOTE — Brief Op Note (Signed)
12/30/2022  9:28 AM  PATIENT:  Adam Petty.  62 y.o. male  PRE-OPERATIVE DIAGNOSIS:  Right shoulder rotator cuff insufficiency, osteoarthritis, end stage  POST-OPERATIVE DIAGNOSIS:  Right shoulder rotataor cuff insufficiency, osteoarthritis, end stage  PROCEDURE:  Procedure(s) with comments: REVERSE SHOULDER ARTHROPLASTY (Right) - interscalene block 120    DePuy Delta Xtend with NO subscap repair  SURGEON:  Surgeons and Role:    Beverely Low, MD - Primary  PHYSICIAN ASSISTANT:   ASSISTANTS: Thea Gist, PA-C   ANESTHESIA:   regional and general  EBL:  275 mL   BLOOD ADMINISTERED:none  DRAINS: none   LOCAL MEDICATIONS USED:  MARCAINE     SPECIMEN:  No Specimen  DISPOSITION OF SPECIMEN:  N/A  COUNTS:  YES  TOURNIQUET:  * No tourniquets in log *  DICTATION: .Other Dictation: Dictation Number [id number]  PLAN OF CARE: Discharge to home after PACU  PATIENT DISPOSITION:  PACU - hemodynamically stable.   Delay start of Pharmacological VTE agent (>24hrs) due to surgical blood loss or risk of bleeding: not applicable

## 2022-12-30 NOTE — Op Note (Signed)
NAME: Adam Petty, Adam Petty MEDICAL RECORD NO: [id number] ACCOUNT NO: [id number] DATE OF BIRTH: January 06, 1961 FACILITY: [facility] LOCATION: [facility] PHYSICIAN: Almedia Balls. Ranell Patrick, MD  Operative Report   DATE OF PROCEDURE: 12/30/2022  PREOPERATIVE DIAGNOSES: 1.  Right shoulder rotator cuff insufficiency. 2.  End-stage osteoarthritis.  POSTOPERATIVE DIAGNOSES:  1.  Right shoulder rotator cuff insufficiency. 2.  End-stage osteoarthritis.  PROCEDURE PERFORMED:  Right reverse total shoulder arthroplasty using DePuy Delta Xtend prosthesis with no subscapularis repair.  ATTENDING SURGEON:  Almedia Balls. Ranell Patrick, MD  ASSISTANT:  Konrad Felix Dixon, [place], who was scrubbed during the entire procedure, and necessary for satisfactory completion of surgery.  ANESTHESIA:  General anesthesia plus interscalene block.  ESTIMATED BLOOD LOSS:  275 mL.  FLUID REPLACEMENT:  1500 mL crystalloid.  COUNTS:  Instrument and sponge count was correct.  COMPLICATIONS:  None.  ANTIBIOTICS:  Perioperative antibiotics were given.  INDICATIONS:  The patient is a 62 year old male with worsening shoulder pain secondary to end-stage arthritis, bone-on-bone.  The patient also has a history of prior rotator cuff surgery and has rotator cuff insufficiency with poor function.  Given his  declining function, increasing pain, and failure of conservative management, we discussed options and recommended reverse total shoulder arthroplasty to reestablish fixed focal mechanics and eliminate pain from the shoulder.  Informed consent was  obtained.  DESCRIPTION OF PROCEDURE:  After an adequate level of general anesthesia was achieved, the patient was positioned in the modified beach chair position.  The right shoulder was correctly identified.  Sterile prep and drape performed.  Timeout called  verifying correct patient and correct site.  We utilized a deltopectoral incision starting at the coracoid process and extending down to the  anterior humerus with a 10-blade scalpel.  Dissection down through subcutaneous tissues using Bovie, cephalic  vein identified and taken in line of the deltoid.  Pectoralis was taken medially. The conjoint tendon was identified and retracted medially.  Deep retractor was placed.  The biceps tenodesed in situ with 0 Vicryl figure-of-eight suture x 2.  We then  released the subscapularis remnant off the lesser tuberosity and tagged for protection of the axillary nerve.  We released the inferior capsule extending the shoulder and delivering the humeral head out of the wound.  Advanced arthritis noted with large  osteophytes and no cartilage.  We entered the proximal humerus with a 6-mm reamer reaming up to a size 12.  We then placed a 12-mm T-handle guide and resected the head at 20 degrees of retroversion with the outsetting saw.  Next, we removed osteophytes  off the humeral neck with a rongeur.  We then subluxed the humerus posteriorly, gaining good exposure of the glenoid.  We removed the capsule and the labrum. We were careful to protect the axillary nerve.  We identified the proper placement for the guide  pin for our Metaglene reamer.  We placed the guide pin bicortically.  We then reamed to the subchondral bone correcting for the patient's retroversion.  Once we were down to subchondral bone, we did our peripheral hand reaming to make room for the  glenosphere.  We then drilled out our central peg hole.  Next, we selected the HA-coated press-fit baseplate and impacted that into position, referencing off the 12 and 6 o'clock positions on the scapula.  We placed a 42 screw in the inferior hole, a 36  screw in the superior hole, at the base of the coracoid, gaining good purchase with those screws. We selected an 18 nonlocked  anteriorly, anchoring that and just used three screws.  We locked our superior and inferior screws.  The baseplate was secured.   We selected a 42 +0 standard glenosphere and attached  that to the baseplate with the screwdriver. We then went to the humeral side and reamed for the two right metaphysis.  Next, we trialed with the 12 stem and the two right metaphysis and we were happy  with the fit.  We reduced the shoulder with a 42 +3 poly on the humeral tray and reduced the shoulder with nice little popliteal reduced.  Appropriate soft tissue tension and balance and stability.  We irrigated thoroughly.  We removed the trial  components.  We selected the real HA-coated Press-Fit 12 stem and the two right metaphysis set on a 0 setting and impacted in 20 degrees of retroversion using both bone graft and impaction grafting technique.  With the stem secured, we selected the real  42 +3 polyethylene, impacted on the humeral tray, reduced the shoulder.  Again, appropriate soft tissue balancing with good tension on the conjoined.  No gapping with inferior pole or external rotation and excellent range of motion.  We irrigated the  shoulder thoroughly.  We then resected the subscapularis remnant, which was not repairable.  At this point, we repaired the deltopectoral interval with 0 Vicryl suture followed by 2-0 Vicryl for subcutaneous closure and 4-0 running Monocryl for skin.   Steri-Strips were applied followed by a sterile dressing and a shoulder sling.  The patient was transported to the recovery room in stable condition, having tolerated the surgery well.   MUK D: 12/30/2022 9:42:33 am T: 12/30/2022 9:51:00 am  JOB: [id number]/ [id number]

## 2023-01-02 ENCOUNTER — Telehealth: Payer: Self-pay | Admitting: Pain Medicine

## 2023-01-02 ENCOUNTER — Encounter (HOSPITAL_COMMUNITY): Payer: Self-pay | Admitting: Orthopedic Surgery

## 2023-01-02 NOTE — Telephone Encounter (Signed)
Pharmacy called stated that this patients has been getting medication from our provider and other providers as well. PT has also been getting other prescription fill at other pharmacy. [facility] just wanted to touch base to see id our office an provider is aware. Please give pharmacy a call at [phone] TY

## 2023-01-02 NOTE — Telephone Encounter (Signed)
Looked at PMP and the patient got Hydromorphone for shoulder surgery and it was filled at a different pharmacy because the pharmacy did not have the medications.  Spoke with the pharmacy and clarified that he had surgery.

## 2023-01-02 NOTE — Telephone Encounter (Signed)
Patient states he had shoulder surgery on Friday and was given 20 Dilaudid 2mg  for surgery pain. Wanted to let our office know.

## 2023-01-23 NOTE — Progress Notes (Signed)
 PROVIDER NOTE: Information contained herein reflects review and annotations entered in association with encounter. Interpretation of such information and data should be left to medically-trained personnel. Information provided to patient can be located elsewhere in the medical record under Patient Instructions. Document created using STT-dictation technology, any transcriptional errors that may result from process are unintentional.    Patient: Adam Petty.  Service Category: E/M  Provider: Eric DELENA Como, MD  DOB: [birth year]  DOS: 01/25/2023  Referring Provider: Mechel, Schutter  MRN: [id number]  Specialty: Interventional Pain Management  PCP: Adam Damien, PA-C  Type: Established Patient  Setting: Ambulatory outpatient    Location: Office  Delivery: Face-to-face     HPI  Mr. Miriam Kestler., a 63 y.o. year old male, is here today because of his Chronic pain syndrome [G89.4]. Mr. Errico primary complain today is Back Pain and Shoulder Pain (right)  Pertinent problems: Mr. 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); Discogenic syndrome, lumbar (WC injury); Osteoarthritis of spine with radiculopathy, lumbar region (WC injury); Lumbar facet syndrome (WC injury) (Bilateral) (R>L); Lumbosacral radiculopathy (WC injury); Lumbar canal stenosis; Neurogenic pain; Neuropathic pain; Musculoskeletal pain; Lumbar spondylosis; Chronic knee pain (2ry area of Pain) (WC injury) (Right); Osteoarthritis of knee (Right); Chronic shoulder pain (3ry area of Pain) (Right); Osteoarthritis of shoulder (Right); Chronic lower extremity pain (Right); Recurrent nephrolithiasis; Acute medial meniscal tear; Chronic pain syndrome (WC injury); Acute low back pain without sciatica; Osteoarthritis of glenohumeral joint (Right); Chronic sacroiliac joint  pain (Bilateral) (R>L); Spondylosis without myelopathy or radiculopathy, lumbosacral region; DDD (degenerative disc disease), lumbosacral; DDD (degenerative disc disease), thoracic; Lumbosacral radiculopathy (S1) (Right); Inflammatory spondylopathy of lumbosacral region [facility]); DDD (degenerative disc disease), cervical; Cervical facet syndrome; Cervicalgia; Cervicogenic headache; Elbow pain (Right); Greater trochanteric bursitis of right hip; Lateral epicondylitis of elbow (Right); Tennis elbow (Right); Chronic elbow pain (Right); Chronic forearm joint pain (Right); Osteoarthritis of elbow (Right); and S/P TKR (total knee replacement) using cement, right on their pertinent problem list. Pain Assessment: Severity of   is reported as a 6 /10. Location: Back Lower/radiates down right leg to knee in the back. Onset: More than a month ago. Quality: Aching, Sharp. Timing: Constant. Modifying factor(s): meds. Vitals:  height is 5' 6 (1.676 m) and weight is 220 lb (99.8 kg). His temperature is 98.3 F (36.8 C). His blood pressure is 148/88 (abnormal) and his pulse is 89. His respiration is 16 and oxygen saturation is 100%.  BMI: Estimated body mass index is 35.51 kg/m as calculated from the following:   Height as of this encounter: 5' 6 (1.676 m).   Weight as of this encounter: 220 lb (99.8 kg). Last encounter: 10/26/2022. Last procedure: Visit date not found.  Reason for encounter: medication management.  According to the patient more most recent PMP on 12/31/2022 he had a prescription for hydromorphone  2 mg tablets (# 20) filled (prescribed by Elspeth Marzetta Her, MD [facility]).  Discussed the use of AI scribe software for clinical note transcription with the patient, who gave verbal consent to proceed.  History of Present Illness   The patient, with a history of chronic back pain, arthritis of the facet joints, diabetes, and recent right shoulder surgery, reports a particularly challenging day with  breakthrough pain. He describes his worst pain as being in the lower right side of his back,  rating it as a 6 out of 10. This pain radiates down the back of his leg, stopping at the knee. He notes that this pattern of pain is consistent with his past experiences.  In addition to his back pain, he also reports pain in his knee and a throbbing sensation in his foot. Despite these symptoms, he affirms that his current pain medication regimen is generally effective, with today being an exception. He occasionally supplements his pain medication with Tylenol  to manage breakthrough pain.  Regarding his recent shoulder surgery, he reports minor pain with movement but overall expresses satisfaction with his recovery. He has been performing home physical therapy and demonstrates good range of motion.  The patient also discusses his ongoing management of diabetes and weight control. He is currently taking Mounjaro 1.5 for both diabetes and weight management, but he believes his diet has a greater impact on his weight than the medication. His blood sugar was 130 on the morning of the consultation.  Lastly, the patient mentions a struggle with constipation, which he attributes to his pain medication and Mounjaro. He manages this with an over-the-counter powder and a fiber supplement, resulting in bowel movements every other day. He notes that this is an improvement from when he was on Ozempic, during which he would go a week or two without a bowel movement.     RTCB: 04/30/2023   Pharmacotherapy Assessment  Analgesic: Oxycodone  IR 5 mg, 1 tab PO q 6 hrs (20 mg/day)  MME/day: 30 mg/day.   Monitoring: [place] PMP: PDMP reviewed during this encounter.       Pharmacotherapy: No side-effects or adverse reactions reported. Compliance: No problems identified. Effectiveness: Clinically acceptable.  Rebecka Wolm HERO, RN  01/25/2023  8:16 AM  Sign when Signing Visit Nursing Pain Medication Assessment:  Safety precautions to be  maintained throughout the outpatient stay will include: orient to surroundings, keep bed in low position, maintain call bell within reach at all times, provide assistance with transfer out of bed and ambulation.  Medication Inspection Compliance: Pill count conducted under aseptic conditions, in front of the patient. Neither the pills nor the bottle was removed from the patient's sight at any time. Once count was completed pills were immediately returned to the patient in their original bottle.  Medication: Oxycodone  IR Pill/Patch Count:  30 of 120 pills remain Pill/Patch Appearance: Markings consistent with prescribed medication Bottle Appearance: Standard pharmacy container. Clearly labeled. Filled Date: 62 / 42 / 2024 Last Medication intake:  Today    No results found for: CBDTHCR No results found for: D8THCCBX No results found for: D9THCCBX  UDS:  Summary  Date Value Ref Range Status  07/20/2022 Note  Final    Comment:    ==================================================================== [employer] Select 13 (MW) ==================================================================== Test                             Result       Flag       Units  Drug Present and Declared for Prescription Verification   Oxycodone                       2010         EXPECTED   ng/mg creat   Oxymorphone                    1267         EXPECTED   ng/mg  creat   Noroxycodone                   1646         EXPECTED   ng/mg creat   Noroxymorphone                 204          EXPECTED   ng/mg creat    Sources of oxycodone  are scheduled prescription medications.    Oxymorphone, noroxycodone, and noroxymorphone are expected    metabolites of oxycodone . Oxymorphone is also available as a    scheduled prescription medication.  ==================================================================== Test                      Result    Flag   Units      Ref Range   Creatinine              167               mg/dL      >=79 ==================================================================== Declared Medications:  The flagging and interpretation on this report are based on the  following declared medications.  Unexpected results may arise from  inaccuracies in the declared medications.   **Note: The testing scope of this panel includes these medications:   Oxycodone   Oxycodone  (Percocet)   **Note: The testing scope of this panel does not include the  following reported medications:   Acetaminophen  (Percocet)  Aspirin   Celecoxib  (Celebrex )  Cholecalciferol  Docusate  Gabapentin  (Neurontin )  Ibuprofen (Advil)  Metformin   Methocarbamol   Naloxone  (Narcan )  Naproxen (Aleve)  Ramipril  (Altace )  Rosuvastatin  (Crestor )  Semaglutide (Ozempic)  Vitamin B12 ==================================================================== For clinical consultation, please call [phone]. ====================================================================       ROS  Constitutional: Denies any fever or chills Gastrointestinal: No reported hemesis, hematochezia, vomiting, or acute GI distress Musculoskeletal: Denies any acute onset joint swelling, redness, loss of ROM, or weakness Neurological: No reported episodes of acute onset apraxia, aphasia, dysarthria, agnosia, amnesia, paralysis, loss of coordination, or loss of consciousness  Medication Review  Semaglutide (2 MG/DOSE), cyanocobalamin, docusate sodium , gabapentin , ibuprofen, metFORMIN , methocarbamol , naloxone , naproxen sodium, ondansetron , oxyCODONE , polyethylene glycol, ramipril , rosuvastatin , and tirzepatide  History Review  Allergy: Mr. Tremont has no known allergies. Drug: Mr. Latner  reports no history of drug use. Alcohol:  reports current alcohol use. Tobacco:  reports that he has never smoked. He has quit using smokeless tobacco.  His smokeless tobacco use included chew. Social: Mr. Mish  reports that he has never smoked.  He has quit using smokeless tobacco.  His smokeless tobacco use included chew. He reports current alcohol use. He reports that he does not use drugs. 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 Surgical: Mr. Claiborne  has a past surgical history that includes Tonsillectomy; Shoulder surgery (Right); Palate surgery; Cervical fusion (1995); Foot surgery; Lumbar laminectomy/decompression microdiscectomy (N/A, 02/06/2013); Knee arthroscopy (Right, 10/02/2013); Knee arthroscopy with medial menisectomy (Right, 03/25/2015); Knee bursectomy (Right, 03/25/2015); Cystoscopy w/ retrogrades (Right, 11/19/2015); Cystoscopy w/ retrogrades (Right, 10/03/2017); Ureteroscopy (Right, 10/03/2017); CTR (Left, 02/27/2018); Colonoscopy (01/24/2003); Colonoscopy (01/16/2018); Rotator cuff repair (Right); Rotator cuff repair (Left); Total knee  arthroplasty (Right, 12/28/2021); Irrigation and debridement knee (Right, 03/08/2022); Cystoscopy/ureteroscopy/holmium laser/stent placement (Right, 08/09/2022); Hand surgery (Left, 10/21/2022); and Reverse shoulder arthroplasty (Right, 12/30/2022). Family: family history includes COPD in his mother; Cancer in his mother; Diabetes in his father; Heart disease in his father and mother.  Laboratory Chemistry Profile   Renal Lab Results  Component Value Date   BUN 21 (H) 12/23/2022   CREATININE 0.90 12/23/2022   GFRAA >60 07/01/2019   GFRNONAA >60 12/23/2022    Hepatic Lab Results  Component Value Date   AST 21 12/27/2020   ALT 24  12/27/2020   ALBUMIN 3.8 12/27/2020   ALKPHOS 82 12/27/2020    Electrolytes Lab Results  Component Value Date   NA 137 12/23/2022   K 4.4 12/23/2022   CL 105 12/23/2022   CALCIUM  9.6 12/23/2022   MG 1.8 03/12/2015    Bone No results found for: VD25OH, VD125OH2TOT, CI6874NY7, CI7874NY7, 25OHVITD1, 25OHVITD2, 25OHVITD3, TESTOFREE, TESTOSTERONE  Inflammation (CRP: Acute Phase) (ESR: Chronic Phase) Lab Results  Component Value Date   CRP 0.6 03/12/2015   ESRSEDRATE 9 03/12/2015         Note: Above Lab results reviewed.  Recent Imaging Review  DG Shoulder Right Port CLINICAL DATA:  Postop shoulder replacement.  EXAM: RIGHT SHOULDER - 1 VIEW  COMPARISON:  Right shoulder MRI 04/19/2016.  Radiographs 06/05/2006.  FINDINGS: 1042 hours. Single AP view demonstrates interval reverse shoulder arthroplasty. The hardware appears well positioned. No evidence of acute fracture or dislocation on single AP view. There is a small amount of gas within the joint and surrounding soft tissues. No unexpected foreign body or complication identified.  IMPRESSION: Interval reverse shoulder arthroplasty without evidence of complication.  Electronically Signed   By: Elsie Perone M.D.   On: 12/30/2022 14:46 Note: Reviewed        Physical Exam  General appearance: Well nourished, well developed, and well hydrated. In no apparent acute distress Mental status: Alert, oriented x 3 (person, place, & time)       Respiratory: No evidence of acute respiratory distress Eyes: PERLA Vitals: BP (!) 148/88   Pulse 89   Temp 98.3 F (36.8 C)   Resp 16   Ht 5' 6 (1.676 m)   Wt 220 lb (99.8 kg)   SpO2 100%   BMI 35.51 kg/m  BMI: Estimated body mass index is 35.51 kg/m as calculated from the following:   Height as of this encounter: 5' 6 (1.676 m).   Weight as of this encounter: 220 lb (99.8 kg). Ideal: Ideal body weight: 63.8 kg (140 lb 10.5 oz) Adjusted ideal body  weight: 78.2 kg (172 lb 6.3 oz)Physical Exam    MUSCULOSKELETAL: Good range of motion in right shoulder. Able to touch thumbs over head. Reports minor pain with movement of right shoulder.       Assessment   Diagnosis Status  1. Chronic pain syndrome   2. Chronic low back pain (WC injury) (1ry area of Pain) (Bilateral) (R>L) w/o sciatica   3. Chronic knee pain (2ry area of Pain) (WC injury) (Right)   4. Chronic lower extremity pain (Right)   5. 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 Discogenic syndrome, lumbar (WC injury)   7. Chronic shoulder pain (3ry area of Pain) (Right)   8. Pharmacologic therapy   9. Chronic use of opiate for therapeutic purpose   10. Encounter for medication management   11. Encounter for chronic pain management  Controlled Controlled Controlled   Updated Problems: No problems updated.  Plan of Care  Problem-specific:  Assessment and Plan    Postoperative Right Shoulder Pain   Following right shoulder surgery on December 30, 2022, he reports minor pain with movement but maintains a good range of motion and is performing home exercises without adverse reactions to current medications. We will continue home physical therapy exercises and monitor pain and range of motion.  Chronic Lower Back Pain   He experiences chronic lower back pain, primarily on the lower right side, radiating to the knee, managed with current medication but occasionally experiences breakthrough pain. Arthritis is present in the facet joints of the spine. We discussed the potential for facet blocks if pain worsens, but he prefers to manage pain with the current regimen unless flare-ups occur. We will continue the current pain management regimen, consider facet blocks if pain worsens, and advise him to call if flare-ups occur.  Type 2 Diabetes Mellitus   His Type 2 diabetes is managed with Mounjaro 12.5 mg, with a blood glucose level of 130 mg/dL this morning. He reports  better control with diet than medication and no adverse reactions except constipation. We will continue Mounjaro 12.5 mg, monitor blood glucose levels regularly, and encourage dietary management.  Constipation   He has chronic constipation, likely exacerbated by Mounjaro and pain medication, using a fiber supplement and an over-the-counter powder laxative. He reports bowel movements every other day, an improvement from previous medication. We will continue the current fiber supplement and over-the-counter laxative and monitor bowel movement frequency.  General Health Maintenance   He is maintaining weight and working full-time with no new health issues reported. We will continue weight management efforts and schedule the next checkup in three months.  Follow-up   We will schedule a follow-up appointment in three months and send prescriptions to [facility] pharmacy.       Mr. Hughes Wyndham. has a current medication list which includes the following long-term medication(s): gabapentin , [START ON 01/30/2023] oxycodone , [START ON 03/01/2023] oxycodone , and [START ON 03/31/2023] oxycodone .  Pharmacotherapy (Medications Ordered): Meds ordered this encounter  Medications   oxyCODONE  (OXY IR/ROXICODONE ) 5 MG immediate release tablet    Sig: Take 1 tablet (5 mg total) by mouth every 6 (six) hours as needed for severe pain (pain score 7-10). Must last 30 days.    Dispense:  120 tablet    Refill:  0    DO NOT: delete (not duplicate); no partial-fill (will deny script to complete), no refill request (F/U required). DISPENSE: 1 day early if closed on fill date. WARN: No CNS-depressants within 8 hrs of med.   oxyCODONE  (OXY IR/ROXICODONE ) 5 MG immediate release tablet    Sig: Take 1 tablet (5 mg total) by mouth every 6 (six) hours as needed for severe pain (pain score 7-10). Must last 30 days.    Dispense:  120 tablet    Refill:  0    DO NOT: delete (not duplicate); no partial-fill (will deny script  to complete), no refill request (F/U required). DISPENSE: 1 day early if closed on fill date. WARN: No CNS-depressants within 8 hrs of med.   oxyCODONE  (OXY IR/ROXICODONE ) 5 MG immediate release tablet    Sig: Take 1 tablet (5 mg total) by mouth every 6 (six) hours as needed for severe pain (pain score 7-10). Must last 30 days.    Dispense:  120 tablet    Refill:  0    DO  NOT: delete (not duplicate); no partial-fill (will deny script to complete), no refill request (F/U required). DISPENSE: 1 day early if closed on fill date. WARN: No CNS-depressants within 8 hrs of med.   Orders:  No orders of the defined types were placed in this encounter.  Follow-up plan:   Return in about 3 months (around 04/30/2023) for Eval-day (M,W), (F2F), (MM).      Interventional Therapies  Risk Factors  Considerations:  MO (BMI>40)  T2IDDM  HTN  IBS  OSA     Planned  Pending:      Under consideration:   Diagnostic right knee genicular NB    Completed:   Diagnostic right lateral elbow steroid injection x1 (11/02/2021) (100/100/0/0)  Palliative right lumbar facet MBB x5 (05/20/2021) (80/80/80/80)  Palliative left lumbar facet MBB x2 (12/16/2015) (60/60/50/50)  Palliative right lumbar facet RFA x1  (Right: 04/13/2016) (100% relief) (100/100/100/100)  Diagnostic/therapeutic bilateral SI joint block x2 (11/17/2016) (NP-NR) Diagnostic right Suprascapular NB x1 (06/22/2016) (0/15/50/50)    Completed by other providers:   Diagnostic right caudal ESI x1 + epidurogram x1 (10/02/2019) by Wallie Sherry, MD  Surgery: Right total knee arthroplasty x1 (12/28/2021) by Dr. DOROTHA Sick.  Surgery: L3-4 and L4-5 lumbar microdiscectomy/decompression (02/06/2013) by Dr. JINNY Billing    Therapeutic  Palliative (PRN) options:   Palliative lumbar facet MBB  Palliative right lumbar facet RFA   Diagnostic Suprascapular NB    Pharmacotherapy  Nonopioids transfer 11/04/2019: Neurontin        Recent Visits No visits were  found meeting these conditions. Showing recent visits within past 90 days and meeting all other requirements Today's Visits Date Type Provider Dept  01/25/23 Office Visit Tanya Glisson, MD [facility]  Showing today's visits and meeting all other requirements Future Appointments No visits were found meeting these conditions. Showing future appointments within next 90 days and meeting all other requirements  I discussed the assessment and treatment plan with the patient. The patient was provided an opportunity to ask questions and all were answered. The patient agreed with the plan and demonstrated an understanding of the instructions.  Patient advised to call back or seek an in-person evaluation if the symptoms or condition worsens.  Duration of encounter: 30 minutes.  Total time on encounter, as per AMA guidelines included both the face-to-face and non-face-to-face time personally spent by the physician and/or other qualified health care professional(s) on the day of the encounter (includes time in activities that require the physician or other qualified health care professional and does not include time in activities normally performed by clinical staff). Physician's time may include the following activities when performed: Preparing to see the patient (e.g., pre-charting review of records, searching for previously ordered imaging, lab work, and nerve conduction tests) Review of prior analgesic pharmacotherapies. Reviewing PMP Interpreting ordered tests (e.g., lab work, imaging, nerve conduction tests) Performing post-procedure evaluations, including interpretation of diagnostic procedures Obtaining and/or reviewing separately obtained history Performing a medically appropriate examination and/or evaluation Counseling and educating the patient/family/caregiver Ordering medications, tests, or procedures Referring and communicating with other health care professionals (when  not separately reported) Documenting clinical information in the electronic or other health record Independently interpreting results (not separately reported) and communicating results to the patient/ family/caregiver Care coordination (not separately reported)  Note by: Glisson DELENA Tanya, MD Date: 01/25/2023; Time: 9:24 AM

## 2023-01-24 NOTE — Patient Instructions (Signed)

## 2023-01-25 ENCOUNTER — Ambulatory Visit: Payer: BC Managed Care – PPO | Attending: Pain Medicine | Admitting: Pain Medicine

## 2023-01-25 ENCOUNTER — Encounter: Payer: Self-pay | Admitting: Pain Medicine

## 2023-01-25 VITALS — BP 148/88 | HR 89 | Temp 98.3°F | Resp 16 | Ht 66.0 in | Wt 220.0 lb

## 2023-01-25 DIAGNOSIS — M961 Postlaminectomy syndrome, not elsewhere classified: Secondary | ICD-10-CM | POA: Diagnosis present

## 2023-01-25 DIAGNOSIS — M79604 Pain in right leg: Secondary | ICD-10-CM | POA: Insufficient documentation

## 2023-01-25 DIAGNOSIS — M25561 Pain in right knee: Secondary | ICD-10-CM | POA: Insufficient documentation

## 2023-01-25 DIAGNOSIS — Z79899 Other long term (current) drug therapy: Secondary | ICD-10-CM | POA: Insufficient documentation

## 2023-01-25 DIAGNOSIS — M5136 Other intervertebral disc degeneration, lumbar region with discogenic back pain only: Secondary | ICD-10-CM | POA: Diagnosis present

## 2023-01-25 DIAGNOSIS — G8929 Other chronic pain: Secondary | ICD-10-CM | POA: Insufficient documentation

## 2023-01-25 DIAGNOSIS — G894 Chronic pain syndrome: Secondary | ICD-10-CM | POA: Insufficient documentation

## 2023-01-25 DIAGNOSIS — M25511 Pain in right shoulder: Secondary | ICD-10-CM | POA: Diagnosis present

## 2023-01-25 DIAGNOSIS — M545 Low back pain, unspecified: Secondary | ICD-10-CM | POA: Diagnosis present

## 2023-01-25 DIAGNOSIS — Z79891 Long term (current) use of opiate analgesic: Secondary | ICD-10-CM | POA: Insufficient documentation

## 2023-01-25 MED ORDER — OXYCODONE HCL 5 MG PO TABS: 5.0000 mg | ORAL_TABLET | Freq: Four times a day (QID) | ORAL | 0 refills | Status: DC | PRN

## 2023-01-25 NOTE — Progress Notes (Signed)
 Nursing Pain Medication Assessment:  Safety precautions to be maintained throughout the outpatient stay will include: orient to surroundings, keep bed in low position, maintain call bell within reach at all times, provide assistance with transfer out of bed and ambulation.  Medication Inspection Compliance: Pill count conducted under aseptic conditions, in front of the patient. Neither the pills nor the bottle was removed from the patient's sight at any time. Once count was completed pills were immediately returned to the patient in their original bottle.  Medication: Oxycodone  IR Pill/Patch Count:  30 of 120 pills remain Pill/Patch Appearance: Markings consistent with prescribed medication Bottle Appearance: Standard pharmacy container. Clearly labeled. Filled Date: 24 / 47 / 2024 Last Medication intake:  Today

## 2023-02-01 ENCOUNTER — Telehealth: Payer: Self-pay | Admitting: Pain Medicine

## 2023-02-01 NOTE — Telephone Encounter (Signed)
 Called [facility], they report that Oxycodone  is there and ready for pick up. I spoke with patient, informed him of this.

## 2023-02-01 NOTE — Telephone Encounter (Signed)
 PT called states that the pharmacy doesn't have refilled for his Oxycodone . PT stated that he will be out of medication on tomorrow. Please give patient a call. TY

## 2023-03-17 ENCOUNTER — Encounter: Payer: Self-pay | Admitting: Cardiology

## 2023-03-17 ENCOUNTER — Ambulatory Visit: Payer: BC Managed Care – PPO | Attending: Cardiology | Admitting: Cardiology

## 2023-03-17 VITALS — BP 122/82 | HR 77 | Ht 66.0 in | Wt 228.4 lb

## 2023-03-17 DIAGNOSIS — Z6836 Body mass index (BMI) 36.0-36.9, adult: Secondary | ICD-10-CM

## 2023-03-17 DIAGNOSIS — R5383 Other fatigue: Secondary | ICD-10-CM | POA: Diagnosis not present

## 2023-03-17 DIAGNOSIS — E782 Mixed hyperlipidemia: Secondary | ICD-10-CM | POA: Diagnosis not present

## 2023-03-17 NOTE — Patient Instructions (Signed)
 Medication Instructions:  Your physician recommends that you continue on your current medications as directed. Please refer to the Current Medication list given to you today.   *If you need a refill on your cardiac medications before your next appointment, please call your pharmacy*   Lab Work: No labs ordered today    Testing/Procedures: No test ordered today    Follow-Up: At [facility], you and your health needs are our priority.  As part of our continuing mission to provide you with exceptional heart care, we have created designated Provider Care Teams.  These Care Teams include your primary Cardiologist (physician) and Advanced Practice Providers (APPs -  Physician Assistants and Nurse Practitioners) who all work together to provide you with the care you need, when you need it.  We recommend signing up for the patient portal called "MyChart".  Sign up information is provided on this After Visit Summary.  MyChart is used to connect with patients for Virtual Visits (Telemedicine).  Patients are able to view lab/test results, encounter notes, upcoming appointments, etc.  Non-urgent messages can be sent to your provider as well.   To learn more about what you can do with MyChart, go to [email or web address].    Your next appointment:   As needed   Provider:   You may see Dr. Azucena Cecil or one of the following Advanced Practice Providers on your designated Care Team:   Nicolasa Ducking, NP Eula Listen, PA-C Cadence Fransico Michael, PA-C Charlsie Quest, NP Carlos Levering, NP

## 2023-03-17 NOTE — Progress Notes (Signed)
 Cardiology Office Note:    Date:  03/17/2023   ID:  Adam Berg., DOB 01-01-1961, MRN [id number]  PCP:  [name], Cordelia Poche   [facility] Providers Cardiologist:  None     Referring MD: [name], PA-C   No chief complaint on file.   History of Present Illness:    Adam Gustafson. is a 63 y.o. male with a hx of diabetes, hypertension, hyperlipidemia presents due to fatigue.  Endorse having symptoms of fatigue for about 6 months now.  Denies chest pain.  Endorses headache.  Follows up with [facility] cardiology from a cardiac perspective.  Not sure why appointment was made today.  Last seen at [facility] cardiology 4 months ago.  Workup with stress echocardiogram for preop evaluation was normal, EF normal.  Has a history of snoring, underwent tonsillectomy previously with resolution of snoring.  Followed up with primary care physician who ordered a sleep study.  This is currently pending.  Past Medical History:  Diagnosis Date   Arthritis, senescent 10/22/2014   Back pain    history spinal stenosis, DDD   Chronic pain syndrome 10/22/2014   DJD (degenerative joint disease)    Dyslipidemia    Essential hypertension    GERD (gastroesophageal reflux disease)    History of kidney stones    Hypercholesterolemia    IBS (irritable bowel syndrome)    Lateral meniscus tear    rt knee   Osteoarthritis    PONV (postoperative nausea and vomiting)    Preventative health care    takes crestor/ altace for "preventative reasons" denies heart or BP problems   Sleep apnea    had UPPP surgery and now sleep apnea resolved   Spinal stenosis    Type 2 diabetes mellitus without complication [facility])     Past Surgical History:  Procedure Laterality Date   CERVICAL FUSION  1995   COLONOSCOPY  01/24/2003   COLONOSCOPY  01/16/2018   CTR Left 02/27/2018   CTR and trigger finger repair   CYSTOSCOPY W/ RETROGRADES Right 11/19/2015   Procedure: URETEROSCOPY WITH RETROGRADE  PYELOGRAM;  Surgeon: Orson Ape, MD;  Location: [facility];  Service: Urology;  Laterality: Right;   CYSTOSCOPY W/ RETROGRADES Right 10/03/2017   Procedure: CYSTOSCOPY WITH RETROGRADE PYELOGRAM;  Surgeon: Orson Ape, MD;  Location: [facility];  Service: Urology;  Laterality: Right;   CYSTOSCOPY/URETEROSCOPY/HOLMIUM LASER/STENT PLACEMENT Right 08/09/2022   Procedure: CYSTOSCOPY/URETEROSCOPY/HOLMIUM LASER/STENT PLACEMENT;  Surgeon: Riki Altes, MD;  Location: [facility];  Service: Urology;  Laterality: Right;   FOOT SURGERY     RT   HAND SURGERY Left 10/21/2022   trigger finger   IRRIGATION AND DEBRIDEMENT KNEE Right 03/08/2022   Procedure: IRRIGATION AND DEBRIDEMENT RIGHT KNEE;  Surgeon: Lyndle Herrlich, MD;  Location: [facility];  Service: Orthopedics;  Laterality: Right;   KNEE ARTHROSCOPY Right 10/02/2013   Procedure: RIGHT KNEE ARTHROSCOPY, PARTIAL LATERAL MENISCECTOMY, DEBRIDEMENT, MEDIAL AND LATERAL CHONDROPLASTY;  Surgeon: Loanne Drilling, MD;  Location: [facility];  Service: Orthopedics;  Laterality: Right;   KNEE ARTHROSCOPY WITH MEDIAL MENISECTOMY Right 03/25/2015   Procedure: RIGHT KNEE ARTHROSCOPY WITH MENISCAL DEBRIDEMENT and chrodroplasty;  Surgeon: Ollen Gross, MD;  Location: [facility];  Service: Orthopedics;  Laterality: Right;   KNEE BURSECTOMY Right 03/25/2015   Procedure: RIGHT KNEE PREPATELLA BURSECTOMY;  Surgeon: Ollen Gross, MD;  Location: [facility];  Service: Orthopedics;  Laterality: Right;   LUMBAR LAMINECTOMY/DECOMPRESSION MICRODISCECTOMY N/A 02/06/2013   Procedure: MICRO LUMBAR DECOMPRESSION  L3-L4 and L4 - L5 ;  Surgeon: Javier Docker, MD;  Location: [facility];  Service: Orthopedics;  Laterality: N/A;   PALATE SURGERY     surg for sleep apnea and sinus issues   REVERSE SHOULDER ARTHROPLASTY Right 12/30/2022   Procedure: REVERSE SHOULDER ARTHROPLASTY;  Surgeon: Beverely Low, MD;  Location: [facility];  Service: Orthopedics;  Laterality: Right;  interscalene block 120    ROTATOR CUFF REPAIR Right    x3   ROTATOR CUFF REPAIR Left    SHOULDER SURGERY Right    spur removal x2   TONSILLECTOMY     TOTAL KNEE ARTHROPLASTY Right 12/28/2021   Procedure: TOTAL KNEE ARTHROPLASTY;  Surgeon: Lyndle Herrlich, MD;  Location: [facility];  Service: Orthopedics;  Laterality: Right;   URETEROSCOPY Right 10/03/2017   Procedure: URETEROSCOPY;  Surgeon: Orson Ape, MD;  Location: [facility];  Service: Urology;  Laterality: Right;    Current Medications: Current Meds  Medication Sig   cyanocobalamin (,VITAMIN B-12,) 1000 MCG/ML injection Inject into the muscle every 30 (thirty) days.    docusate sodium (COLACE) 100 MG capsule Take 1 capsule (100 mg total) by mouth 2 (two) times daily.   ibuprofen (ADVIL) 600 MG tablet Take 600 mg by mouth 3 (three) times daily.   metFORMIN (GLUCOPHAGE) 1000 MG tablet Take 1,000 mg by mouth 2 (two) times daily with a meal.   methocarbamol (ROBAXIN) 500 MG tablet Take 1 tablet (500 mg total) by mouth every 8 (eight) hours as needed for muscle spasms.   MOUNJARO 12.5 MG/0.5ML Pen Inject 0.5 mLs (12.5 mg total) subcutaneously once a week   naloxone (NARCAN) nasal spray 4 mg/0.1 mL Place 1 spray into the nose as needed for up to 365 doses (for opioid-induced respiratory depresssion). In case of emergency (overdose), spray once into each nostril. If no response within 3 minutes, repeat application and call 911.   naproxen sodium (ALEVE) 220 MG tablet Take 440 mg by mouth 2 (two) times daily as needed.    ondansetron (ZOFRAN) 4 MG tablet Take 1 tablet (4 mg total) by mouth every 8 (eight) hours as needed for nausea, vomiting or refractory nausea / vomiting.   oxyCODONE (OXY IR/ROXICODONE) 5 MG immediate release tablet Take 1 tablet (5 mg total) by mouth every 6 (six) hours as needed for severe pain (pain score 7-10). Must last 30 days.   [START ON 03/31/2023] oxyCODONE (OXY IR/ROXICODONE) 5 MG immediate release tablet Take 1 tablet (5 mg total) by  mouth every 6 (six) hours as needed for severe pain (pain score 7-10). Must last 30 days.   polyethylene glycol (MIRALAX / GLYCOLAX) 17 g packet Take 17 g by mouth daily.   ramipril (ALTACE) 5 MG capsule Take 5 mg by mouth daily.   rosuvastatin (CRESTOR) 5 MG tablet Take 5 mg by mouth 3 (three) times a week. Monday Wednesday Friday     Allergies:   Patient has no known allergies.   Social History   Socioeconomic History   Marital status: Married    Spouse name: Dois Davenport   Number of children: 2   Years of education: Not on file   Highest education level: Not on file  Occupational History   Not on file  Tobacco Use   Smoking status: Never   Smokeless tobacco: Former    Types: Engineer, drilling   Vaping status: Never Used  Substance and Sexual Activity   Alcohol use: Yes    Comment: occasional  Drug use: No   Sexual activity: Not on file  Other Topics Concern   Not on file  Social History Narrative   Not on file   Social Drivers of Health   Financial Resource Strain: Low Risk  (09/15/2022)   Received from [facility]   Overall Financial Resource Strain (CARDIA)    Difficulty of Paying Living Expenses: Not hard at all  Food Insecurity: No Food Insecurity (12/28/2021)   Hunger Vital Sign    Worried About Running Out of Food in the Last Year: Never true    Ran Out of Food in the Last Year: Never true  Transportation Needs: No Transportation Needs (09/15/2022)   Received from [facility] - Transportation    In the past 12 months, has lack of transportation kept you from medical appointments or from getting medications?: No    Lack of Transportation (Non-Medical): No  Physical Activity: Not on file  Stress: Not on file  Social Connections: Not on file     Family History: The patient's family history includes COPD in his mother; Cancer in his mother; Diabetes in his father; Heart disease in his father and mother.  ROS:    Please see the history of present illness.     All other systems reviewed and are negative.  EKGs/Labs/Other Studies Reviewed:    The following studies were reviewed today:  EKG Interpretation Date/Time:  Friday March 17 2023 08:52:40 EST Ventricular Rate:  77 PR Interval:  156 QRS Duration:  84 QT Interval:  360 QTC Calculation: 407 R Axis:   -18  Text Interpretation: Normal sinus rhythm Cannot rule out Anterior infarct Confirmed by Debbe Odea ([id number]) on 03/17/2023 8:57:52 AM    Recent Labs: 12/23/2022: BUN 21; Creatinine, Ser 0.90; Hemoglobin 13.9; Platelets 203; Potassium 4.4; Sodium 137  Recent Lipid Panel    Component Value Date/Time   CHOL 162 08/31/2012 0355   TRIG 405 (H) 08/31/2012 0355   HDL 43 08/31/2012 0355   VLDL SEE COMMENT 08/31/2012 0355   LDLCALC SEE COMMENT 08/31/2012 0355     Risk Assessment/Calculations:             Physical Exam:    VS:  BP 122/82 (BP Location: Left Arm, Patient Position: Sitting)   Pulse 77   Ht 5\' 6"  (1.676 m)   Wt 228 lb 6.4 oz (103.6 kg)   SpO2 95%   BMI 36.86 kg/m     Wt Readings from Last 3 Encounters:  03/17/23 228 lb 6.4 oz (103.6 kg)  01/25/23 220 lb (99.8 kg)  12/23/22 227 lb (103 kg)     GEN:  Well nourished, well developed in no acute distress HEENT: Normal NECK: No JVD; No carotid bruits CARDIAC: RRR, no murmurs, rubs, gallops RESPIRATORY:  Clear to auscultation without rales, wheezing or rhonchi  ABDOMEN: Soft, non-tender, non-distended MUSCULOSKELETAL:  No edema; No deformity  SKIN: Warm and dry NEUROLOGIC:  Alert and oriented x 3 PSYCHIATRIC:  Normal affect   ASSESSMENT:    1. Fatigue, unspecified type   2. BMI 36.0-36.9,adult   3. Mixed hyperlipidemia    PLAN:    In order of problems listed above:  History of fatigue, denies chest pain.  Stress echo 09/2022 with normal EF, no significant abnormalities.  Etiology appears noncardiac.  Differential includes deconditioning, OSA.  Has  sleep study scheduled. Hyperlipidemia, continue statin.  Follow-up with primary cardiologist/[facility] cardiology.  No indication for additional testing at this  point.  Keep follow-up appointment with PCP regarding sleep study eval and results.     Medication Adjustments/Labs and Tests Ordered: Current medicines are reviewed at length with the patient today.  Concerns regarding medicines are outlined above.  Orders Placed This Encounter  Procedures   EKG 12-Lead   No orders of the defined types were placed in this encounter.   Patient Instructions  Medication Instructions:  Your physician recommends that you continue on your current medications as directed. Please refer to the Current Medication list given to you today.   *If you need a refill on your cardiac medications before your next appointment, please call your pharmacy*   Lab Work: No labs ordered today    Testing/Procedures: No test ordered today    Follow-Up: At [facility], you and your health needs are our priority.  As part of our continuing mission to provide you with exceptional heart care, we have created designated Provider Care Teams.  These Care Teams include your primary Cardiologist (physician) and Advanced Practice Providers (APPs -  Physician Assistants and Nurse Practitioners) who all work together to provide you with the care you need, when you need it.  We recommend signing up for the patient portal called "MyChart".  Sign up information is provided on this After Visit Summary.  MyChart is used to connect with patients for Virtual Visits (Telemedicine).  Patients are able to view lab/test results, encounter notes, upcoming appointments, etc.  Non-urgent messages can be sent to your provider as well.   To learn more about what you can do with MyChart, go to [email or web address].    Your next appointment:   As needed   Provider:   You may see Dr. Azucena Cecil or one of the following Advanced  Practice Providers on your designated Care Team:   Nicolasa Ducking, NP Eula Listen, PA-C Cadence Fransico Michael, PA-C Charlsie Quest, NP Carlos Levering, NP       Signed, Debbe Odea, MD  03/17/2023 9:57 AM    [facility]

## 2023-04-12 ENCOUNTER — Encounter: Payer: Self-pay | Admitting: Neurology

## 2023-04-26 ENCOUNTER — Encounter: Payer: BC Managed Care – PPO | Admitting: Pain Medicine

## 2023-04-26 NOTE — Progress Notes (Unsigned)
 PROVIDER NOTE: Interpretation of information contained herein should be left to medically-trained personnel. Specific patient instructions are provided elsewhere under "Patient Instructions" section of medical record. This document was created in part using AI and STT-dictation technology, any transcriptional errors that may result from this process are unintentional.  Patient: Adam Petty.  Service: E/M   PCP: [name], PA-C  DOB: 1960-11-12  DOS: 04/28/2023  Provider: Bettey Costa, NP  MRN: [id number]  Delivery: Face-to-face  Specialty: Interventional Pain Management  Type: Established Patient  Setting: Ambulatory outpatient facility  Specialty designation: 09  Referring Prov.: [name], PA-C  Location: Outpatient office facility       HPI  [name], a 63 y.o. year old male, is here today because of his No primary diagnosis found.. [name] primary complain today is No chief complaint on file.  Pertinent problems: [name] does not have any pertinent problems on file. Pain Assessment: Severity of   is reported as a  /10. Location:    / . Onset:  . Quality:  . Timing:  . Modifying factor(s):  Marland Kitchen Vitals:  vitals were not taken for this visit.  BMI: Estimated body mass index is 36.86 kg/m as calculated from the following:   Height as of 03/17/23: 5\' 6"  (1.676 m).   Weight as of 03/17/23: 228 lb 6.4 oz (103.6 kg). Last encounter: Visit date not found. Last procedure: Visit date not found.  Reason for encounter: {Blank single:19197::"medication management","post-procedure evaluation and assessment","both, medication management and post-procedure evaluation and assessment","evaluation of worsening, or previously known (established) problem","patient-requested evaluation","follow-up evaluation","evaluation for possible interventional PM therapy/treatment","evaluation of new problem"," *** "}. ***  Discussed the use of AI scribe software for clinical note  transcription with the patient, who gave verbal consent to proceed.  History of Present Illness           Pharmacotherapy Assessment  Analgesic: Oxycodone IR 5 mg, 1 tab PO q 6 hrs (20 mg/day)  MME/day: 30 mg/day   Monitoring: [place] PMP: PDMP reviewed during this encounter. 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)","No side-effects or adverse reactions reported."} Compliance: {Blank single:19197::"Medication agreement violation - unsanctioned acquisition/use of additional opioid-containing medication","No problems identified."} Effectiveness: {Blank single:19197::"Clinically acceptable."}  No notes on file  No results found for: "CBDTHCR" No results found for: "D8THCCBX" No results found for: "D9THCCBX"  UDS:  Summary  Date Value Ref Range Status  07/20/2022 Note  Final    Comment:    ==================================================================== [facility] (MW) ==================================================================== Test                             Result       Flag       Units  Drug Present and Declared for Prescription Verification   Oxycodone                      2010         EXPECTED   ng/mg creat   Oxymorphone                    1267         EXPECTED   ng/mg creat   Noroxycodone  1646         EXPECTED   ng/mg creat   Noroxymorphone                 204          EXPECTED   ng/mg creat    Sources of oxycodone are scheduled prescription medications.    Oxymorphone, noroxycodone, and noroxymorphone are expected    metabolites of oxycodone. Oxymorphone is also available as a    scheduled prescription medication.  ==================================================================== Test                      Result    Flag   Units      Ref Range    Creatinine              167              mg/dL      >=57 ==================================================================== Declared Medications:  The flagging and interpretation on this report are based on the  following declared medications.  Unexpected results may arise from  inaccuracies in the declared medications.   **Note: The testing scope of this panel includes these medications:   Oxycodone  Oxycodone (Percocet)   **Note: The testing scope of this panel does not include the  following reported medications:   Acetaminophen (Percocet)  Aspirin  Celecoxib (Celebrex)  Cholecalciferol  Docusate  Gabapentin (Neurontin)  Ibuprofen (Advil)  Metformin  Methocarbamol  Naloxone (Narcan)  Naproxen (Aleve)  Ramipril (Altace)  Rosuvastatin (Crestor)  Semaglutide (Ozempic)  Vitamin B12 ==================================================================== For clinical consultation, please call [phone]. ====================================================================       ROS  Constitutional: {Blank single:19197::"Denies any fever or chills"} Gastrointestinal: {Blank single:19197::"No reported hemesis, hematochezia, vomiting, or acute GI distress"} Musculoskeletal: {Blank single:19197::"Denies any acute onset joint swelling, redness, loss of ROM, or weakness"} Neurological: {Blank single:19197::"No reported episodes of acute onset apraxia, aphasia, dysarthria, agnosia, amnesia, paralysis, loss of coordination, or loss of consciousness"}  Medication Review  Semaglutide (2 MG/DOSE), cyanocobalamin, docusate sodium, gabapentin, ibuprofen, metFORMIN, methocarbamol, naloxone, naproxen sodium, ondansetron, oxyCODONE, polyethylene glycol, ramipril, rosuvastatin, and tirzepatide  History Review  Allergy: [name] has no known allergies. Drug: [name]  reports no history of drug use. Alcohol:  reports current alcohol use. Tobacco:  reports that he has never  smoked. He has quit using smokeless tobacco.  His smokeless tobacco use included chew. Social: [name]  reports that he has never smoked. He has quit using smokeless tobacco.  His smokeless tobacco use included chew. He reports current alcohol use. He reports that he does not use drugs. 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 Surgical: Mr. Imel  has a past surgical history that includes Tonsillectomy; Shoulder surgery (Right); Palate surgery; Cervical fusion (1995); Foot surgery; Lumbar laminectomy/decompression microdiscectomy (N/A, 02/06/2013); Knee arthroscopy (Right, 10/02/2013); Knee arthroscopy with medial menisectomy (Right, 03/25/2015); Knee bursectomy (Right, 03/25/2015); Cystoscopy w/ retrogrades (Right, 11/19/2015); Cystoscopy w/ retrogrades (Right, 10/03/2017); Ureteroscopy (Right, 10/03/2017); CTR (Left, 02/27/2018); Colonoscopy (01/24/2003); Colonoscopy (01/16/2018); Rotator cuff repair (Right); Rotator cuff repair (Left); Total knee arthroplasty (Right, 12/28/2021); Irrigation and debridement knee (Right, 03/08/2022); Cystoscopy/ureteroscopy/holmium laser/stent placement (Right, 08/09/2022); Hand surgery (Left, 10/21/2022);  and Reverse shoulder arthroplasty (Right, 12/30/2022). Family: family history includes COPD in his mother; Cancer in his mother; Diabetes in his father; Heart disease in his father and mother.  Laboratory Chemistry Profile   Renal Lab Results  Component Value Date   BUN 21 (H) 12/23/2022   CREATININE  0.90 12/23/2022   GFRAA >60 07/01/2019   GFRNONAA >60 12/23/2022    Hepatic Lab Results  Component Value Date   AST 21 12/27/2020   ALT 24 12/27/2020   ALBUMIN 3.8 12/27/2020   ALKPHOS 82 12/27/2020    Electrolytes Lab Results  Component Value Date   NA 137 12/23/2022   K 4.4 12/23/2022   CL 105 12/23/2022   CALCIUM 9.6 12/23/2022   MG 1.8 03/12/2015    Bone No results found for: "VD25OH", "VD125OH2TOT", "GN5621HY8", "MV7846NG2", "25OHVITD1", "25OHVITD2", "25OHVITD3", "TESTOFREE", "TESTOSTERONE"  Inflammation (CRP: Acute Phase) (ESR: Chronic Phase) Lab Results  Component Value Date   CRP 0.6 03/12/2015   ESRSEDRATE 9 03/12/2015         Note: {Blank single:19197::"Mr. Sieling indicates labs done and monitored by primary care practitioner using a non-[facility] EMR system","No results found under the [facility] electronic medical record","Results made available to patient.","Lab results reviewed and made available to patient.","Lab results reviewed and explained to patient in Layman's terms.","Above Lab results reviewed."}  Recent Imaging Review  DG Shoulder Right Port CLINICAL DATA:  Postop shoulder replacement.  EXAM: RIGHT SHOULDER - 1 VIEW  COMPARISON:  Right shoulder MRI 04/19/2016.  Radiographs 06/05/2006.  FINDINGS: 1042 hours. Single AP view demonstrates interval reverse shoulder arthroplasty. The hardware appears well positioned. No evidence of acute fracture or dislocation on single AP view. There is a small amount of gas within the joint and surrounding soft tissues. No unexpected foreign body or complication identified.  IMPRESSION: Interval reverse shoulder arthroplasty without evidence of complication.  Electronically Signed   By: Carey Bullocks M.D.   On: 12/30/2022 14:46 Note: {Blank single:19197::"No new results found.","No results found under the [facility] electronic medical record.","Imaging results reviewed and explained to patient in  Layman's terms.","Results of ordered imaging test(s) reviewed and explained to patient in Layman's terms.","Imaging results reviewed.","Reviewed"} {Blank single:19197::"Results visible under [facility].","Results visible under [facility].","Results visible under [facility].","Results visible under [facility].","Results visible under "Care Everywhere".","Results made available to patient.","Copy of results provided to patient.","     "}  Physical Exam  General appearance: {general exam:210120802::"Well nourished, well developed, and well hydrated. In no apparent acute distress"} Mental status: {Blank single:19197::"Alert and oriented x 3. Exaggerated physical and/or psychosocial pain behavior perceived.","Alert, oriented x 3 (person, place, & time)"} {Blank single:19197::"Mr. 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 BMI: Estimated body mass index is 36.86 kg/m as calculated from the following:   Height as of 03/17/23: 5\' 6"  (1.676 m).   Weight as of 03/17/23: 228 lb 6.4 oz (103.6 kg). Ideal: Patient weight not recorded  Assessment   Diagnosis Status  1. Chronic low back pain (WC injury) (1ry area of Pain) (Bilateral) (R>L) w/o sciatica   2. Chronic knee pain (2ry area of Pain) (WC injury) (Right)   3. Chronic lower extremity pain (Right)   4. 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 Discogenic syndrome, lumbar (WC injury)  6. Chronic shoulder pain (3ry area of Pain) (Right)   7. Chronic pain syndrome   8. Pharmacologic therapy   9. Chronic use of opiate for therapeutic purpose   10. Encounter for medication management   11. Encounter for chronic pain  management    {Problem Stability:19197::"Deteriorating","Having a Flare-up","Improved","Improving","Not improving","Not responding","Persistent","Recurring","Reoccurring","Resolved","Responding","Stable","Unchanged","Unimproved","Worsened","Worsening","Controlled"} {Problem Stability:19197::"Deteriorating","Having a Flare-up","Improved","Improving","Not improving","Not responding","Persistent","Recurring","Reoccurring","Resolved","Responding","Stable","Unchanged","Unimproved","Worsened","Worsening","Controlled"} {Problem Stability:19197::"Deteriorating","Having a Flare-up","Improved","Improving","Not improving","Not responding","Persistent","Recurring","Reoccurring","Resolved","Responding","Stable","Unchanged","Unimproved","Worsened","Worsening","Controlled"}   Updated Problems: No problems updated.  Plan of Care  Problem-specific:  Assessment and Plan            Mr. Zekiel Torian. has a current medication list which includes the following long-term medication(s): gabapentin, oxycodone, oxycodone, and oxycodone.  Pharmacotherapy (Medications Ordered): No orders of the defined types were placed in this encounter.  Orders:  No orders of the defined types were placed in this encounter.  Follow-up plan:   No follow-ups on file.     Interventional Therapies  Risk Factors  Considerations:  MO (BMI>40)  T2IDDM  HTN  IBS  OSA      Planned  Pending:        Under consideration:   Diagnostic right knee genicular NB     Completed:   Diagnostic right lateral elbow steroid injection x1 (11/02/2021) (100/100/0/0)  Palliative right lumbar facet MBB x5 (05/20/2021) (80/80/80/80)  Palliative left lumbar facet MBB x2 (12/16/2015) (60/60/50/50)  Palliative right lumbar facet RFA x1  (Right: 04/13/2016) (100% relief) (100/100/100/100)  Diagnostic/therapeutic bilateral SI joint block x2 (11/17/2016) (NP-NR) Diagnostic right Suprascapular NB x1 (06/22/2016) (0/15/50/50)      Completed by other providers:   Diagnostic right caudal ESI x1 + epidurogram x1 (10/02/2019) by Edward Jolly, MD  Surgery: Right total knee arthroplasty x1 (12/28/2021) by Dr. Meridee Score.  Surgery: L3-4 and L4-5 lumbar microdiscectomy/decompression (02/06/2013) by Dr. Pat Patrick     Therapeutic  Palliative (PRN) options:   Palliative lumbar facet MBB  Palliative right lumbar facet RFA   Diagnostic Suprascapular NB     Pharmacotherapy  Nonopioids transfer 11/04/2019: Neurontin       Recent Visits No visits were found meeting these conditions. Showing recent visits within past 90 days and meeting all other requirements Future Appointments Date Type Provider Dept  04/28/23 Appointment Bettey Costa, NP [facility]  Showing future appointments within next 90 days and meeting all other requirements  I discussed the assessment and treatment plan with the patient. The patient was provided an opportunity to ask questions and all were answered. The patient agreed with the plan and demonstrated an understanding of the instructions.  Patient advised to call back or seek an in-person evaluation if the symptoms or condition worsens.  Duration of encounter: *** minutes.  Total time on encounter, as per AMA guidelines included both the face-to-face and non-face-to-face time personally spent by the physician and/or other qualified health care professional(s) on the day of the encounter (includes time in activities that require the physician or other qualified health care professional and does not include time in activities normally performed by clinical staff). Physician's time may include the following activities when performed: Preparing to see the patient (e.g., pre-charting review of records, searching for previously ordered imaging, lab work, and nerve conduction tests) Review of prior analgesic pharmacotherapies. Reviewing PMP Interpreting ordered tests (e.g., lab work, imaging, nerve  conduction tests) Performing post-procedure evaluations, including interpretation of diagnostic procedures Obtaining and/or reviewing separately obtained history Performing a medically appropriate examination and/or evaluation Counseling and educating the patient/family/caregiver Ordering medications, tests, or  procedures Referring and communicating with other health care professionals (when not separately reported) Documenting clinical information in the electronic or other health record Independently interpreting results (not separately reported) and communicating results to the patient/ family/caregiver Care coordination (not separately reported)  Note by: Bettey Costa, NP (TTS and AI technology used. I apologize for any typographical errors that were not detected and corrected.) Date: 04/28/2023; Time: 4:22 PM

## 2023-04-28 ENCOUNTER — Ambulatory Visit: Attending: Pain Medicine | Admitting: Nurse Practitioner

## 2023-04-28 DIAGNOSIS — M545 Low back pain, unspecified: Secondary | ICD-10-CM | POA: Insufficient documentation

## 2023-04-28 DIAGNOSIS — M25511 Pain in right shoulder: Secondary | ICD-10-CM | POA: Diagnosis present

## 2023-04-28 DIAGNOSIS — M79604 Pain in right leg: Secondary | ICD-10-CM | POA: Diagnosis present

## 2023-04-28 DIAGNOSIS — M961 Postlaminectomy syndrome, not elsewhere classified: Secondary | ICD-10-CM | POA: Diagnosis present

## 2023-04-28 DIAGNOSIS — G8929 Other chronic pain: Secondary | ICD-10-CM

## 2023-04-28 DIAGNOSIS — M25561 Pain in right knee: Secondary | ICD-10-CM | POA: Diagnosis present

## 2023-04-28 DIAGNOSIS — G894 Chronic pain syndrome: Secondary | ICD-10-CM | POA: Diagnosis present

## 2023-04-28 DIAGNOSIS — Z79891 Long term (current) use of opiate analgesic: Secondary | ICD-10-CM

## 2023-04-28 DIAGNOSIS — M5136 Other intervertebral disc degeneration, lumbar region with discogenic back pain only: Secondary | ICD-10-CM | POA: Diagnosis present

## 2023-04-28 DIAGNOSIS — Z79899 Other long term (current) drug therapy: Secondary | ICD-10-CM | POA: Diagnosis present

## 2023-04-28 MED ORDER — OXYCODONE HCL 5 MG PO TABS: 5.0000 mg | ORAL_TABLET | Freq: Four times a day (QID) | ORAL | 0 refills | Status: DC | PRN

## 2023-04-28 NOTE — Patient Instructions (Signed)

## 2023-04-28 NOTE — Progress Notes (Signed)
 Nursing Pain Medication Assessment:  Safety precautions to be maintained throughout the outpatient stay will include: orient to surroundings, keep bed in low position, maintain call bell within reach at all times, provide assistance with transfer out of bed and ambulation.  Medication Inspection Compliance: Pill count conducted under aseptic conditions, in front of the patient. Neither the pills nor the bottle was removed from the patient's sight at any time. Once count was completed pills were immediately returned to the patient in their original bottle.  Medication: See above Pill/Patch Count:  34 of 120 pills remain Pill/Patch Appearance: Markings consistent with prescribed medication Bottle Appearance: Standard pharmacy container. Clearly labeled. Filled Date: 3 / 45 / 2025 Last Medication intake:  TodaySafety precautions to be maintained throughout the outpatient stay will include: orient to surroundings, keep bed in low position, maintain call bell within reach at all times, provide assistance with transfer out of bed and ambulation.

## 2023-06-16 NOTE — Progress Notes (Signed)
 NEUROLOGY CONSULTATION NOTE  Adam Petty. MRN: [id number] DOB: 01/23/1960  Referring provider: Madaline Scales, PA-C Primary care provider: Madaline Scales, PA-C  Reason for consult:  headache  Assessment/Plan:   Headache - semiology consistent with Primary Thunderclap Headache, except atypical due to longer duration.  Otherwise, due to severity, may be migraine without aura Vasovagal syncope, recurrent   Due to semiology with thunderclap headache and recurrent vasovagal syncope, will check CTA head and neck As he has positive response to amitriptyline, will increase to 50mg  at bedtime.  If no improvement in 6 weeks, consider increasing dose or switching to a calcium  channel blocker (eg verapamil). May use Excedrin Migraine but limit use of pain relievers to no more than 9 days out of the month to prevent risk of rebound or medication-overuse headache. Caffeine cessation Keep headache diary Follow up 6 months.   Subjective:  Adam Petty is a 63 year old right-handed male with HTN, chronic low back pain/spinal stenosis, arthritis, IBS, DM 2 and history of kidney stones and OSA s/p tonsillectomy who presents for headache.  History supplemented by referring provider's note.  Headaches: Onset:  late 2024.  Location:  bilateral frontal, face, diffuse.  Quality:  feels like head "going to explode".  Intensity:  severe.  Associated symptoms:  Sees dark spots.  No nausea, vomiting, photophobia, phonophobia, .  Duration:  Sudden onset, 30 to 45 minutes with Excedrin (otherwise 2 hours).  Frequency:  Initially twice daily.  Since starting amitriptyline, 2 to 3 times a week.  Triggers:  Unknown.  Relieving factors:  Closing eyes and putting hand on head.  Activity:  Cannot concentrate or perform activities.  If driving, has to pull over.  Usually occurs mid day to evening.  Does not wake him up at night or wakes up with headache.    Recurrent vasovagal syncope:   History of  recurrent syncope.  Brought on by valsalva maneuver (laughing, coughing).  EEG on 12/20/2022 was normal.  Has not happened during a severe headache.  MRI of brain without contrast on 12/22/2022 personally reviewed revealed nonspecific multifocal T2 FLAIR hyperintensities within the cerebral white matter, likely chronic small vessel ischemic changes but no acute intracranial abnormality.    Followed by cardiology.  Stress echo was normal.    He has history of OSA for which he underwent tonsillectomy and uvulopalatopharyngoplasty, which helped and reportedly no longer snores and doesn't use his CPAP.  Past NSAIDS/analgesics:  ibuprofen, celecoxib  Past abortive triptans:  sumatriptan tab Past abortive ergotamine:  none Past muscle relaxants:  methocarbamol  Past anti-emetic:  none Past antihypertensive medications:  none Past antidepressant medications: none Past anticonvulsant medications:  none.   Past anti-CGRP:  none Past vitamins/Herbal/Supplements:  magnesium  citrate Past antihistamines/decongestants:  none Other past therapies:  none  Current NSAIDS/analgesics:  Excedrin Migraine, ibuprofen, naproxen 220mg , oxycodone  Current triptans:  none Current ergotamine:  none Current anti-emetic:  none Current muscle relaxants:  none Current Antihypertensive medications:  none Current Antidepressant medications:  amitriptyline 25mg  at bedtime Current Anticonvulsant medications:  gabapentin  1200mg  BID Current anti-CGRP:  none Current Vitamins/Herbal/Supplements:  B12 Current Antihistamines/Decongestants:  none Other therapy:  none   Caffeine:  up to 60 oz coffee daily.  No soda or energy drinks. Alcohol:  rarely/social Smoker:  no Diet:  drinks 4 to 6 bottles of water  daily.   Exercise:  no Depression:  no; Anxiety:  Yes.  Job-related.   Other pain:  Chronic low back pain, arthritis Sleep hygiene:  good.  No significant history of headaches except occasional tension type  headache No history of head trauma/concussion Family history of headache:  not significant Family history of aneurysms:  not known. Other family history:  sister (seizure disorder)      PAST MEDICAL HISTORY: Past Medical History:  Diagnosis Date   Arthritis, senescent 10/22/2014   Back pain    history spinal stenosis, DDD   Chronic pain syndrome 10/22/2014   DJD (degenerative joint disease)    Dyslipidemia    Essential hypertension    GERD (gastroesophageal reflux disease)    History of kidney stones    Hypercholesterolemia    IBS (irritable bowel syndrome)    Lateral meniscus tear    rt knee   Osteoarthritis    PONV (postoperative nausea and vomiting)    Preventative health care    takes crestor / altace  for "preventative reasons" denies heart or BP problems   Sleep apnea    had UPPP surgery and now sleep apnea resolved   Spinal stenosis    Type 2 diabetes mellitus without complication (HCC)     PAST SURGICAL HISTORY: Past Surgical History:  Procedure Laterality Date   CERVICAL FUSION  1995   COLONOSCOPY  01/24/2003   COLONOSCOPY  01/16/2018   CTR Left 02/27/2018   CTR and trigger finger repair   CYSTOSCOPY W/ RETROGRADES Right 11/19/2015   Procedure: URETEROSCOPY WITH RETROGRADE PYELOGRAM;  Surgeon: Rea Cambridge, MD;  Location: [facility];  Service: Urology;  Laterality: Right;   CYSTOSCOPY W/ RETROGRADES Right 10/03/2017   Procedure: CYSTOSCOPY WITH RETROGRADE PYELOGRAM;  Surgeon: Rea Cambridge, MD;  Location: [facility];  Service: Urology;  Laterality: Right;   CYSTOSCOPY/URETEROSCOPY/HOLMIUM LASER/STENT PLACEMENT Right 08/09/2022   Procedure: CYSTOSCOPY/URETEROSCOPY/HOLMIUM LASER/STENT PLACEMENT;  Surgeon: Geraline Knapp, MD;  Location: [facility];  Service: Urology;  Laterality: Right;   FOOT SURGERY     RT   HAND SURGERY Left 10/21/2022   trigger finger   IRRIGATION AND DEBRIDEMENT KNEE Right 03/08/2022   Procedure: IRRIGATION AND DEBRIDEMENT RIGHT KNEE;   Surgeon: Jerlyn Moons, MD;  Location: [facility];  Service: Orthopedics;  Laterality: Right;   KNEE ARTHROSCOPY Right 10/02/2013   Procedure: RIGHT KNEE ARTHROSCOPY, PARTIAL LATERAL MENISCECTOMY, DEBRIDEMENT, MEDIAL AND LATERAL CHONDROPLASTY;  Surgeon: Aurther Blue, MD;  Location: [facility];  Service: Orthopedics;  Laterality: Right;   KNEE ARTHROSCOPY WITH MEDIAL MENISECTOMY Right 03/25/2015   Procedure: RIGHT KNEE ARTHROSCOPY WITH MENISCAL DEBRIDEMENT and chrodroplasty;  Surgeon: Liliane Rei, MD;  Location: [facility];  Service: Orthopedics;  Laterality: Right;   KNEE BURSECTOMY Right 03/25/2015   Procedure: RIGHT KNEE PREPATELLA BURSECTOMY;  Surgeon: Liliane Rei, MD;  Location: [facility];  Service: Orthopedics;  Laterality: Right;   LUMBAR LAMINECTOMY/DECOMPRESSION MICRODISCECTOMY N/A 02/06/2013   Procedure: MICRO LUMBAR DECOMPRESSION L3-L4 and L4 - L5 ;  Surgeon: Loel Ring, MD;  Location: [facility];  Service: Orthopedics;  Laterality: N/A;   PALATE SURGERY     surg for sleep apnea and sinus issues   REVERSE SHOULDER ARTHROPLASTY Right 12/30/2022   Procedure: REVERSE SHOULDER ARTHROPLASTY;  Surgeon: Winston Hawking, MD;  Location: [facility];  Service: Orthopedics;  Laterality: Right;  interscalene block 120   ROTATOR CUFF REPAIR Right    x3   ROTATOR CUFF REPAIR Left    SHOULDER SURGERY Right    spur removal x2   TONSILLECTOMY     TOTAL KNEE ARTHROPLASTY Right 12/28/2021   Procedure: TOTAL KNEE ARTHROPLASTY;  Surgeon: Mathews Solomons  R, MD;  Location: [facility];  Service: Orthopedics;  Laterality: Right;   URETEROSCOPY Right 10/03/2017   Procedure: URETEROSCOPY;  Surgeon: Rea Cambridge, MD;  Location: [facility];  Service: Urology;  Laterality: Right;    MEDICATIONS: Current Outpatient Medications on File Prior to Visit  Medication Sig Dispense Refill   cyanocobalamin (,VITAMIN B-12,) 1000 MCG/ML injection Inject into the muscle every 30 (thirty) days.      docusate sodium  (COLACE) 100 MG  capsule Take 1 capsule (100 mg total) by mouth 2 (two) times daily. 60 capsule 0   gabapentin  (NEURONTIN ) 600 MG tablet Take 1 tablet (600 mg total) by mouth every 6 (six) hours. (Patient taking differently: Take 1,200 mg by mouth 2 (two) times daily.) 360 tablet 0   ibuprofen (ADVIL) 600 MG tablet Take 600 mg by mouth 3 (three) times daily.     metFORMIN  (GLUCOPHAGE ) 1000 MG tablet Take 1,000 mg by mouth 2 (two) times daily with a meal.     methocarbamol  (ROBAXIN ) 500 MG tablet Take 1 tablet (500 mg total) by mouth every 8 (eight) hours as needed for muscle spasms. 40 tablet 1   MOUNJARO 12.5 MG/0.5ML Pen Inject 0.5 mLs (12.5 mg total) subcutaneously once a week     naloxone  (NARCAN ) nasal spray 4 mg/0.1 mL Place 1 spray into the nose as needed for up to 365 doses (for opioid-induced respiratory depresssion). In case of emergency (overdose), spray once into each nostril. If no response within 3 minutes, repeat application and call 911. 1 each 0   naproxen sodium (ALEVE) 220 MG tablet Take 440 mg by mouth 2 (two) times daily as needed.      ondansetron  (ZOFRAN ) 4 MG tablet Take 1 tablet (4 mg total) by mouth every 8 (eight) hours as needed for nausea, vomiting or refractory nausea / vomiting. 30 tablet 1   oxyCODONE  (OXY IR/ROXICODONE ) 5 MG immediate release tablet Take 1 tablet (5 mg total) by mouth every 6 (six) hours as needed for severe pain (pain score 7-10). Must last 30 days. 120 tablet 0   oxyCODONE  (OXY IR/ROXICODONE ) 5 MG immediate release tablet Take 1 tablet (5 mg total) by mouth every 6 (six) hours as needed for severe pain (pain score 7-10). Must last 30 days. 120 tablet 0   [START ON 07/03/2023] oxyCODONE  (OXY IR/ROXICODONE ) 5 MG immediate release tablet Take 1 tablet (5 mg total) by mouth every 6 (six) hours as needed for severe pain (pain score 7-10). Must last 30 days. 120 tablet 0   oxyCODONE  (OXY IR/ROXICODONE ) 5 MG immediate release tablet Take 1 tablet (5 mg total) by mouth every 6  (six) hours as needed for severe pain (pain score 7-10). Must last 30 days. 120 tablet 0   oxyCODONE  (OXY IR/ROXICODONE ) 5 MG immediate release tablet Take 1 tablet (5 mg total) by mouth every 6 (six) hours as needed for severe pain (pain score 7-10). Must last 30 days. 120 tablet 0   polyethylene glycol (MIRALAX  / GLYCOLAX ) 17 g packet Take 17 g by mouth daily.     ramipril  (ALTACE ) 5 MG capsule Take 5 mg by mouth daily.     rosuvastatin  (CRESTOR ) 5 MG tablet Take 5 mg by mouth 3 (three) times a week. Monday Wednesday Friday     Semaglutide, 2 MG/DOSE, (OZEMPIC, 2 MG/DOSE,) 8 MG/3ML SOPN Inject 1 mg into the skin every 7 (seven) days. Sunday     No current facility-administered medications on file prior to visit.  ALLERGIES: No Known Allergies  FAMILY HISTORY: Family History  Problem Relation Age of Onset   Cancer Mother    Heart disease Mother    COPD Mother    Diabetes Father    Heart disease Father     Objective:  Blood pressure 121/76, pulse 81, height 5\' 6"  (1.676 m), weight 231 lb (104.8 kg), SpO2 97%. General: No acute distress.  Patient appears well-groomed.   Head:  Normocephalic/atraumatic Eyes:  fundi examined but not visualized Neck: supple, no paraspinal tenderness, full range of motion Back: No paraspinal tenderness Heart: regular rate and rhythm Lungs: Clear to auscultation bilaterally. Vascular: No carotid bruits. Neurological Exam: Mental status: alert and oriented to person, place, and time, speech fluent and not dysarthric, language intact. Cranial nerves: CN I: not tested CN II: pupils equal, round and reactive to light, visual fields intact CN III, IV, VI:  full range of motion, no nystagmus, no ptosis CN V: facial sensation intact. CN VII: upper and lower face symmetric CN VIII: hearing intact CN IX, X: gag intact, uvula midline CN XI: sternocleidomastoid and trapezius muscles intact CN XII: tongue midline Bulk & Tone: normal, no  fasciculations. Motor:  muscle strength 5/5 throughout Sensation:  Pinprick, temperature and vibratory sensation intact. Deep Tendon Reflexes:  2+ throughout,  toes downgoing.   Finger to nose testing:  Without dysmetria.   Heel to shin:  Without dysmetria.   Gait:  Normal station and stride.  Romberg negative.    Thank you for allowing me to take part in the care of this patient.  Janne Members, DO  CC: Madaline Scales, PA-C

## 2023-06-19 ENCOUNTER — Ambulatory Visit: Admitting: Neurology

## 2023-06-19 ENCOUNTER — Encounter: Payer: Self-pay | Admitting: Neurology

## 2023-06-19 VITALS — BP 121/76 | HR 81 | Ht 66.0 in | Wt 231.0 lb

## 2023-06-19 DIAGNOSIS — R55 Syncope and collapse: Secondary | ICD-10-CM

## 2023-06-19 DIAGNOSIS — G4453 Primary thunderclap headache: Secondary | ICD-10-CM | POA: Diagnosis not present

## 2023-06-19 MED ORDER — AMITRIPTYLINE HCL 50 MG PO TABS: 50.0000 mg | ORAL_TABLET | Freq: Every day | ORAL | 5 refills | Status: AC

## 2023-06-19 NOTE — Patient Instructions (Addendum)
 Check CTA head and neck at [facility] [phone] Increase amitriptyline to 50mg  at bedtime.  If no improvement in 6 weeks, send me a mychart message Use Excedrin as needed but limit use of pain relievers to no more than 9 days out of the month to prevent risk of rebound or medication-overuse headache. Keep headache diary Follow up 6 months.

## 2023-07-03 ENCOUNTER — Encounter: Payer: Self-pay | Admitting: Neurology

## 2023-07-04 ENCOUNTER — Inpatient Hospital Stay: Admission: RE | Admit: 2023-07-04 | Source: Ambulatory Visit

## 2023-07-04 ENCOUNTER — Other Ambulatory Visit

## 2023-07-26 ENCOUNTER — Encounter: Payer: Self-pay | Admitting: Nurse Practitioner

## 2023-07-26 ENCOUNTER — Ambulatory Visit: Attending: Nurse Practitioner | Admitting: Nurse Practitioner

## 2023-07-26 DIAGNOSIS — M25511 Pain in right shoulder: Secondary | ICD-10-CM | POA: Insufficient documentation

## 2023-07-26 DIAGNOSIS — Z79891 Long term (current) use of opiate analgesic: Secondary | ICD-10-CM | POA: Diagnosis present

## 2023-07-26 DIAGNOSIS — M545 Low back pain, unspecified: Secondary | ICD-10-CM | POA: Diagnosis present

## 2023-07-26 DIAGNOSIS — Z79899 Other long term (current) drug therapy: Secondary | ICD-10-CM | POA: Insufficient documentation

## 2023-07-26 DIAGNOSIS — M25561 Pain in right knee: Secondary | ICD-10-CM | POA: Diagnosis present

## 2023-07-26 DIAGNOSIS — G894 Chronic pain syndrome: Secondary | ICD-10-CM | POA: Insufficient documentation

## 2023-07-26 DIAGNOSIS — M961 Postlaminectomy syndrome, not elsewhere classified: Secondary | ICD-10-CM | POA: Insufficient documentation

## 2023-07-26 DIAGNOSIS — G8929 Other chronic pain: Secondary | ICD-10-CM | POA: Diagnosis present

## 2023-07-26 DIAGNOSIS — M5136 Other intervertebral disc degeneration, lumbar region with discogenic back pain only: Secondary | ICD-10-CM | POA: Diagnosis present

## 2023-07-26 DIAGNOSIS — M792 Neuralgia and neuritis, unspecified: Secondary | ICD-10-CM | POA: Insufficient documentation

## 2023-07-26 DIAGNOSIS — M79604 Pain in right leg: Secondary | ICD-10-CM | POA: Insufficient documentation

## 2023-07-26 MED ORDER — OXYCODONE HCL 5 MG PO TABS
5.0000 mg | ORAL_TABLET | Freq: Four times a day (QID) | ORAL | 0 refills | Status: DC | PRN
Start: 2023-10-04 — End: 2023-10-23

## 2023-07-26 MED ORDER — OXYCODONE HCL 5 MG PO TABS: 5.0000 mg | ORAL_TABLET | Freq: Four times a day (QID) | ORAL | 0 refills | Status: DC | PRN

## 2023-07-26 NOTE — Progress Notes (Signed)
 PROVIDER NOTE: Interpretation of information contained herein should be left to medically-trained personnel. Specific patient instructions are provided elsewhere under Patient Instructions section of medical record. This document was created in part using AI and STT-dictation technology, any transcriptional errors that may result from this process are unintentional.  Patient: Adam Petty.  Service: E/M   PCP: Adam Perkins, PA-C  DOB: 14-Nov-1960  DOS: 07/26/2023  Provider: Emmy MARLA Blanch, NP  MRN: [id number]  Delivery: Face-to-face  Specialty: Interventional Pain Management  Type: Established Patient  Setting: Ambulatory outpatient facility  Specialty designation: 09  Referring Prov.: Adam Perkins, PA-C  Location: Outpatient office facility       History of present illness (HPI) Mr. Juwuan Sedita., a 63 y.o. year old male, is here today because of his No primary diagnosis found.. 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 Location: Back Lower/right leg to the knee. Onset: More than a month ago. Quality: Other (Comment) (intense). Timing: Constant. Modifying factor(s): medications, OTC meds, heatinig pad. Vitals:  height is 5' 6 (1.676 m) and weight is 220 lb (99.8 kg). His temporal temperature is 97.1 F (36.2 C) (abnormal). His blood pressure is 135/80 and his pulse is 86. His respiration is 16 and oxygen saturation is 98%.  BMI: Estimated body mass index is 35.51 kg/m as  calculated from the following:   Height as of this encounter: 5' 6 (1.676 m).   Weight as of this encounter: 220 lb (99.8 kg).  Last encounter: 04/28/2023. Last procedure: Visit date not found.  Reason for encounter: medication management. The patient indicates doing well with the current medication regimen. No adverse reaction or side effects reported to the medication.   Pharmacotherapy Assessment   Oxycodone  IR 5 mg, 1 tab PO q 6 hrs (20 mg/day)  MME/day: 30 mg/day Monitoring: [place] PMP: PDMP reviewed during this encounter.       Pharmacotherapy: No side-effects or adverse reactions reported. Compliance: No problems identified. Effectiveness: Clinically acceptable.  Shela Reda CROME, RN  07/26/2023  8:52 AM  Sign when Signing Visit Nursing Pain Medication Assessment:  Safety precautions to be maintained throughout the outpatient stay will include: orient to surroundings, keep bed in low position, maintain call bell within reach at all times, provide assistance with transfer out of bed and ambulation.  Medication Inspection Compliance: Pill count conducted under aseptic conditions, in front of the patient. Neither the pills nor the bottle was removed from the patient's sight at any time. Once count was completed pills were immediately returned to the patient in their original bottle.  Medication: Oxycodone  IR Pill/Patch Count: 42 of 120 pills/patches remain Pill/Patch Appearance: Markings consistent with prescribed medication Bottle Appearance: Standard pharmacy container. Clearly labeled. Filled Date: 06 / 19 / 2025 Last Medication intake:  TodaySafety precautions to be maintained throughout the outpatient stay will include: orient to surroundings, keep bed in low position, maintain call bell within reach at all times, provide assistance with transfer out of bed and ambulation.     UDS:  Summary  Date Value Ref Range Status  07/20/2022 Note  Final    Comment:     ==================================================================== [employer] Select 13 (MW) ==================================================================== Test                             Result       Flag       Units  Drug Present and Declared for Prescription Verification   Oxycodone                       2010         EXPECTED   ng/mg creat   Oxymorphone                    1267         EXPECTED   ng/mg creat   Noroxycodone                   1646         EXPECTED   ng/mg creat   Noroxymorphone                 204          EXPECTED   ng/mg creat    Sources of oxycodone  are scheduled prescription medications.    Oxymorphone, noroxycodone, and noroxymorphone are expected    metabolites of oxycodone . Oxymorphone is also available as a    scheduled prescription medication.  ==================================================================== Test                      Result    Flag   Units      Ref Range   Creatinine              167              mg/dL      >=79 ==================================================================== Declared Medications:  The flagging and interpretation on this report are based on the  following declared medications.  Unexpected results may arise from  inaccuracies in the declared medications.   **Note: The testing scope of this panel includes these medications:   Oxycodone   Oxycodone  (Percocet)   **Note: The testing scope of this panel does not include the  following reported medications:   Acetaminophen  (Percocet)  Aspirin   Celecoxib  (Celebrex )  Cholecalciferol  Docusate  Gabapentin  (Neurontin )  Ibuprofen (Advil)  Metformin   Methocarbamol   Naloxone  (Narcan )  Naproxen (Aleve)  Ramipril  (Altace )  Rosuvastatin  (Crestor )  Semaglutide (Ozempic)  Vitamin B12 ==================================================================== For clinical consultation, please call [phone]. ====================================================================     No results found for: CBDTHCR No results found for: D8THCCBX No results found for: D9THCCBX  ROS  Constitutional: Denies any fever or chills Gastrointestinal: No reported hemesis, hematochezia, vomiting, or acute GI distress Musculoskeletal: Lower back pain Neurological: No reported episodes of acute onset apraxia, aphasia, dysarthria, agnosia, amnesia, paralysis, loss of coordination, or loss of consciousness  Medication Review  amitriptyline , cyanocobalamin, docusate sodium , gabapentin , ibuprofen, metFORMIN , methocarbamol , naloxone , naproxen sodium, oxyCODONE , polyethylene glycol, ramipril , rosuvastatin , and tirzepatide  History Review  Allergy: Adam Petty has no known allergies. Drug: Adam Petty  reports no history of drug use. Alcohol:  reports current alcohol use. Tobacco:  reports that he has never smoked. He has quit using smokeless tobacco.  His smokeless tobacco use included chew. Social: Adam Petty  reports that he has never smoked. He has quit using smokeless tobacco.  His smokeless  tobacco use included chew. He reports current alcohol use. He reports that he does not use drugs. 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 Surgical: Adam Petty  has a past surgical history that includes Tonsillectomy; Shoulder surgery (Right); Palate surgery; Cervical fusion (1995); Foot surgery; Lumbar laminectomy/decompression microdiscectomy (N/A,  02/06/2013); Knee arthroscopy (Right, 10/02/2013); Knee arthroscopy with medial menisectomy (Right, 03/25/2015); Knee bursectomy (Right, 03/25/2015); Cystoscopy w/ retrogrades (Right, 11/19/2015); Cystoscopy w/ retrogrades (Right, 10/03/2017); Ureteroscopy (Right, 10/03/2017); CTR (Left, 02/27/2018); Colonoscopy (01/24/2003); Colonoscopy (01/16/2018); Rotator cuff repair (Right); Rotator cuff repair (Left); Total knee arthroplasty (Right, 12/28/2021); Irrigation and debridement knee (Right, 03/08/2022); Cystoscopy/ureteroscopy/holmium laser/stent placement (Right, 08/09/2022); Hand surgery (Left, 10/21/2022); and Reverse shoulder arthroplasty (Right, 12/30/2022). Family: family history includes COPD in his mother; Cancer in his mother; Diabetes in his father; Heart disease in his father and mother; Seizures in his sister.  Laboratory Chemistry Profile   Renal Lab Results  Component Value Date   BUN 21 (H) 12/23/2022   CREATININE 0.90 12/23/2022   GFRAA >60 07/01/2019   GFRNONAA >60 12/23/2022    Hepatic Lab Results  Component Value Date   AST 21 12/27/2020   ALT 24 12/27/2020   ALBUMIN 3.8 12/27/2020   ALKPHOS 82 12/27/2020    Electrolytes Lab Results  Component Value Date   NA 137 12/23/2022   K 4.4 12/23/2022   CL 105 12/23/2022   CALCIUM  9.6 12/23/2022   MG 1.8 03/12/2015    Bone No results found for: VD25OH, VD125OH2TOT, CI6874NY7, CI7874NY7, 25OHVITD1, 25OHVITD2, 25OHVITD3, TESTOFREE, TESTOSTERONE  Inflammation (CRP: Acute Phase) (ESR: Chronic Phase) Lab Results  Component Value Date   CRP 0.6 03/12/2015   ESRSEDRATE 9 03/12/2015         Note: Above Lab results reviewed.  Recent Imaging Review  DG Shoulder Right Port CLINICAL DATA:  Postop shoulder replacement.  EXAM: RIGHT SHOULDER - 1 VIEW  COMPARISON:  Right shoulder MRI 04/19/2016.  Radiographs 06/05/2006.  FINDINGS: 1042 hours. Single AP view demonstrates interval reverse  shoulder arthroplasty. The hardware appears well positioned. No evidence of acute fracture or dislocation on single AP view. There is a small amount of gas within the joint and surrounding soft tissues. No unexpected foreign body or complication identified.  IMPRESSION: Interval reverse shoulder arthroplasty without evidence of complication.  Electronically Signed   By: Elsie Perone M.D.   On: 12/30/2022 14:46 Note: Reviewed        Physical Exam  Vitals: BP 135/80 (Cuff Size: Large)   Pulse 86   Temp (!) 97.1 F (36.2 C) (Temporal)   Resp 16   Ht 5' 6 (1.676 m)   Wt 220 lb (99.8 kg)   SpO2 98%   BMI 35.51 kg/m  BMI: Estimated body mass index is 35.51 kg/m as calculated from the following:   Height as of this encounter: 5' 6 (1.676 m).   Weight as of this encounter: 220 lb (99.8 kg). Ideal: Ideal body weight: 63.8 kg (140 lb 10.5 oz) Adjusted ideal body weight: 78.2 kg (172 lb 6.3 oz) General appearance: Well nourished, well developed, and well hydrated. In no apparent acute distress Mental status: Alert, oriented x 3 (person, place, & time)  Respiratory: No evidence of acute respiratory distress Eyes: PERLA   Assessment   Diagnosis Status  1. Chronic low back pain (WC injury) (1ry area of Pain) (Bilateral) (R>L) w/o sciatica   2. Chronic knee pain (2ry area of Pain) (WC injury) (Right)   3. Chronic lower extremity pain (Right)   4. 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 Discogenic syndrome, lumbar (WC injury)   6. Chronic shoulder pain (3ry area of Pain) (Right)   7. Chronic pain syndrome   8. Pharmacologic therapy   9. Chronic use of opiate for therapeutic purpose   10. Encounter for medication management   11. Encounter for chronic pain management   12. Neurogenic pain    Controlled Controlled Controlled   Updated Problems: No problems updated.  Plan of Care  Problem-specific:  Assessment and Plan We will continue on the current  medication regimen.  Prescription drug monitoring program (PDMP) data is consistent with prescribed use. Routine UDS ordered today.  No other new issues or problems reported to this visit.  Schedule follow-up in 90 days for medication management.   Mr. Abran Gavigan. has a current medication list which includes the following long-term medication(s): amitriptyline , gabapentin , [START ON 08/05/2023] oxycodone , [START ON 09/04/2023] oxycodone , and [START ON 10/04/2023] oxycodone .  Pharmacotherapy (Medications Ordered): Meds ordered this encounter  Medications   oxyCODONE  (OXY IR/ROXICODONE ) 5 MG immediate release tablet    Sig: Take 1 tablet (5 mg total) by mouth every 6 (six) hours as needed for severe pain (pain score 7-10). Must last 30 days.    Dispense:  120 tablet    Refill:  0    DO NOT: delete (not duplicate); no partial-fill (will deny script to complete), no refill request (F/U required). DISPENSE: 1 day early if closed on fill date. WARN: No CNS-depressants within 8 hrs of med.   oxyCODONE  (OXY IR/ROXICODONE ) 5 MG immediate release tablet    Sig: Take 1 tablet (5 mg total) by mouth every 6 (six) hours as needed for severe pain (pain score 7-10). Must last 30 days.    Dispense:  120 tablet    Refill:  0    DO NOT: delete (not duplicate); no partial-fill (will deny script to complete), no refill request (F/U required). DISPENSE: 1 day early if closed on fill date. WARN: No CNS-depressants within 8 hrs of med.   oxyCODONE  (OXY IR/ROXICODONE ) 5 MG immediate release tablet    Sig: Take 1 tablet (5 mg total) by mouth every 6 (six) hours as needed for severe pain (pain score 7-10). Must last 30 days.    Dispense:  120 tablet    Refill:  0    DO NOT: delete (not duplicate); no partial-fill (will deny script to complete), no refill request (F/U required). DISPENSE: 1 day early if closed on fill date. WARN: No CNS-depressants within 8 hrs of med.   Orders:  Orders Placed This Encounter   Procedures   Tox[employer]SU[employer] Select 13 (MW), Urine    Volume: 30 ml(s). Minimum 3 ml of urine is needed. Document temperature of fresh sample. Indications: Long term (current) use of opiate analgesic (S20.108)    Release to patient:   Immediate     Interventional Therapies  Risk Factors  Considerations:  MO (BMI>40)  T2IDDM  HTN  IBS  OSA      Planned  Pending:        Under consideration:   Diagnostic right knee genicular NB  Completed:   Diagnostic right lateral elbow steroid injection x1 (11/02/2021) (100/100/0/0)  Palliative right lumbar facet MBB x5 (05/20/2021) (80/80/80/80)  Palliative left lumbar facet MBB x2 (12/16/2015) (60/60/50/50)  Palliative right lumbar facet RFA x1  (Right: 04/13/2016) (100% relief) (100/100/100/100)  Diagnostic/therapeutic bilateral SI joint block x2 (11/17/2016) (NP-NR) Diagnostic right Suprascapular NB x1 (06/22/2016) (0/15/50/50)     Completed by other providers:   Diagnostic right caudal ESI x1 + epidurogram x1 (10/02/2019) by Wallie Sherry, MD  Surgery: Right total knee arthroplasty x1 (12/28/2021) by Dr. DOROTHA Sick.  Surgery: L3-4 and L4-5 lumbar microdiscectomy/decompression (02/06/2013) by Dr. JINNY Billing     Therapeutic  Palliative (PRN) options:   Palliative lumbar facet MBB  Palliative right lumbar facet RFA   Diagnostic Suprascapular NB     Pharmacotherapy  Nonopioids transfer 11/04/2019: Neurontin         Return in about 3 months (around 10/26/2023) for (F2F), (MM), Emmy Blanch NP.    Recent Visits Date Type Provider Dept  04/28/23 Office Visit Shareeka Yim K, NP [facility]  Showing recent visits within past 90 days and meeting all other requirements Today's Visits Date Type Provider Dept  07/26/23 Office Visit Loris Winrow K, NP [facility]  Showing today's visits and meeting all other requirements Future Appointments Date Type Provider Dept  10/23/23 Appointment Wilfredo Canterbury K, NP [facility]  Showing future appointments within next 90 days and meeting all other requirements  I discussed the assessment and treatment plan with the patient. The patient was provided an opportunity to ask questions and all were answered. The patient agreed with the plan and demonstrated an understanding of the instructions.  Patient advised to call back or seek an in-person evaluation if the symptoms or condition worsens.  Duration of encounter: 30 minutes.  Total time on encounter, as per AMA guidelines included both the face-to-face and non-face-to-face time personally spent by the physician and/or other qualified health care professional(s) on the day of the encounter (includes time in activities that require the physician or other qualified health care professional and does not include time in activities normally performed by clinical staff). Physician's time may include the following activities when performed: Preparing to see the patient (e.g., pre-charting review of records, searching for previously ordered imaging, lab work, and nerve conduction tests) Review of prior analgesic pharmacotherapies. Reviewing PMP Interpreting ordered tests (e.g., lab work, imaging, nerve conduction tests) Performing post-procedure evaluations, including interpretation of diagnostic procedures Obtaining and/or reviewing separately obtained history Performing a medically appropriate examination and/or evaluation Counseling and educating the patient/family/caregiver Ordering medications, tests, or procedures Referring and communicating with other health care professionals (when not separately reported) Documenting clinical information in the electronic or other health record Independently interpreting results (not separately reported) and communicating results to the patient/ family/caregiver Care coordination (not separately reported)  Note by: Shandee Jergens K Sicily Zaragoza, NP (TTS and AI technology used. I apologize for  any typographical errors that were not detected and corrected.) Date: 07/26/2023; Time: 11:54 AM

## 2023-07-26 NOTE — Progress Notes (Signed)
 Nursing Pain Medication Assessment:  Safety precautions to be maintained throughout the outpatient stay will include: orient to surroundings, keep bed in low position, maintain call bell within reach at all times, provide assistance with transfer out of bed and ambulation.  Medication Inspection Compliance: Pill count conducted under aseptic conditions, in front of the patient. Neither the pills nor the bottle was removed from the patient's sight at any time. Once count was completed pills were immediately returned to the patient in their original bottle.  Medication: Oxycodone  IR Pill/Patch Count: 42 of 120 pills/patches remain Pill/Patch Appearance: Markings consistent with prescribed medication Bottle Appearance: Standard pharmacy container. Clearly labeled. Filled Date: 06 / 19 / 2025 Last Medication intake:  TodaySafety precautions to be maintained throughout the outpatient stay will include: orient to surroundings, keep bed in low position, maintain call bell within reach at all times, provide assistance with transfer out of bed and ambulation.

## 2023-07-30 LAB — TOXASSURE SELECT 13 (MW), URINE

## 2023-10-23 ENCOUNTER — Ambulatory Visit: Attending: Nurse Practitioner | Admitting: Nurse Practitioner

## 2023-10-23 ENCOUNTER — Encounter: Payer: Self-pay | Admitting: Nurse Practitioner

## 2023-10-23 VITALS — BP 143/83 | HR 81 | Temp 97.3°F | Resp 16 | Ht 66.0 in | Wt 225.0 lb

## 2023-10-23 DIAGNOSIS — M961 Postlaminectomy syndrome, not elsewhere classified: Secondary | ICD-10-CM | POA: Diagnosis present

## 2023-10-23 DIAGNOSIS — M545 Low back pain, unspecified: Secondary | ICD-10-CM | POA: Diagnosis not present

## 2023-10-23 DIAGNOSIS — G894 Chronic pain syndrome: Secondary | ICD-10-CM | POA: Diagnosis present

## 2023-10-23 DIAGNOSIS — M25561 Pain in right knee: Secondary | ICD-10-CM | POA: Diagnosis present

## 2023-10-23 DIAGNOSIS — M25511 Pain in right shoulder: Secondary | ICD-10-CM | POA: Diagnosis present

## 2023-10-23 DIAGNOSIS — G8929 Other chronic pain: Secondary | ICD-10-CM | POA: Diagnosis present

## 2023-10-23 DIAGNOSIS — Z79891 Long term (current) use of opiate analgesic: Secondary | ICD-10-CM | POA: Diagnosis present

## 2023-10-23 DIAGNOSIS — M79604 Pain in right leg: Secondary | ICD-10-CM | POA: Diagnosis present

## 2023-10-23 MED ORDER — OXYCODONE HCL 5 MG PO TABS: 5.0000 mg | ORAL_TABLET | Freq: Four times a day (QID) | ORAL | 0 refills | Status: DC | PRN

## 2023-10-23 MED ORDER — NALOXONE HCL 4 MG/0.1ML NA LIQD: 1.0000 | NASAL | 0 refills | Status: AC | PRN

## 2023-10-23 NOTE — Progress Notes (Signed)
 PROVIDER NOTE: Interpretation of information contained herein should be left to medically-trained personnel. Specific patient instructions are provided elsewhere under Patient Instructions section of medical record. This document was created in part using AI and STT-dictation technology, any transcriptional errors that may result from this process are unintentional.  Patient: Adam Petty.  Service: E/M   PCP: Adam Perkins, PA-C  DOB: 09/11/1960  DOS: 10/23/2023  Provider: Emmy MARLA Blanch, NP  MRN: [id number]  Delivery: Face-to-face  Specialty: Interventional Pain Management  Type: Established Patient  Setting: Ambulatory outpatient facility  Specialty designation: 09  Referring Prov.: Adam Perkins, PA-C  Location: Outpatient office facility       History of present illness (HPI) Mr. Nader Boys., a 63 y.o. year old male, is here today because of his Low back pain. 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 Location: Back Lower/right leg to the calf. Onset: More than a month ago. Quality: Stabbing, Sharp. Timing: Constant. Modifying factor(s): heating pad. Vitals:  height is 5' 6 (1.676 m) and weight is 225 lb (102.1 kg). His temporal temperature is 97.3 F (36.3 C) (abnormal). His blood pressure is 143/83 (abnormal) and his pulse is 81. His respiration is 16 and oxygen saturation is 99%.  BMI: Estimated body mass index is 36.32 kg/m as calculated from the following:    Height as of this encounter: 5' 6 (1.676 m).   Weight as of this encounter: 225 lb (102.1 kg).  Last encounter: 07/26/2023. Last procedure: Visit date not found.  Reason for encounter: medication management. The patient indicates doing well with the current medication regimen. No adverse reaction or side effects reported to the medication.  The patient continues experiencing low back pain; however, current pain medication regimen manage pain level and functional activities.  His blood pressure slightly elevated with BP-143/83.  Pharmacotherapy Assessment   Oxycodone  IR 5 mg, 1 tab PO q 6 hrs (20 mg/day)  MME/day: 30 mg/day Monitoring: [place] PMP: PDMP reviewed during this encounter.       Pharmacotherapy: No side-effects or adverse reactions reported. Compliance: No problems identified. Effectiveness: Clinically acceptable.  Shela Reda CROME, RN  10/23/2023  8:44 AM  Sign when Signing Visit Nursing Pain Medication Assessment:  Safety precautions to be maintained throughout the outpatient stay will include: orient to surroundings, keep bed in low position, maintain call bell within reach at all times, provide assistance with transfer out of bed and ambulation.  Medication Inspection Compliance: Pill count conducted under aseptic conditions, in front of the patient. Neither the pills nor the bottle was removed from the patient's sight at any time. Once count was completed pills were immediately returned to the patient in their original bottle.  Medication: Oxycodone  IR Pill/Patch Count: 50 of 120 pills/patches remain Pill/Patch Appearance: Markings consistent with prescribed medication Bottle Appearance: Standard pharmacy container. Clearly labeled. Filled Date: 09 / 19 / 2025 Last Medication intake:  TodaySafety precautions to be maintained throughout the outpatient stay will include: orient to surroundings, keep bed in low position, maintain call bell  within reach at all times, provide assistance  with transfer out of bed and ambulation.     UDS:  Summary  Date Value Ref Range Status  07/26/2023 FINAL  Final    Comment:    ==================================================================== [facility] (MW) ==================================================================== Test                             Result       Flag       Units  Drug Present and Declared for Prescription Verification   Oxycodone                       1220         EXPECTED   ng/mg creat   Oxymorphone                    935          EXPECTED   ng/mg creat   Noroxycodone                   574          EXPECTED   ng/mg creat   Noroxymorphone                 79           EXPECTED   ng/mg creat    Sources of oxycodone  are scheduled prescription medications.    Oxymorphone, noroxycodone, and noroxymorphone are expected    metabolites of oxycodone . Oxymorphone is also available as a    scheduled prescription medication.  ==================================================================== Test                      Result    Flag   Units      Ref Range   Creatinine              197              mg/dL      >=79 ==================================================================== Declared Medications:  The flagging and interpretation on this report are based on the  following declared medications.  Unexpected results may arise from  inaccuracies in the declared medications.   **Note: The testing scope of this panel includes these medications:   Oxycodone  (Roxicodone )   **Note: The testing scope of this panel does not include the  following reported medications:   Amitriptyline  (Elavil )  Docusate (Colace)  Gabapentin  (Neurontin )  Ibuprofen (Advil)  Metformin  (Glucophage )  Methocarbamol  (Robaxin )  Naloxone  (Narcan )  Naproxen (Aleve)  Polyethylene Glycol (MiraLAX )  Ramipril  (Altace )  Rosuvastatin  (Crestor )  Tirzepatide (Mounjaro)  Vitamin  B12 ==================================================================== For clinical consultation, please call [phone]. ====================================================================     No results found for: CBDTHCR No results found for: D8THCCBX No results found for: D9THCCBX  ROS  Constitutional: Denies any fever or chills Gastrointestinal: No reported hemesis, hematochezia, vomiting, or acute GI distress Musculoskeletal: Low Back pain Neurological: No reported episodes of acute onset apraxia, aphasia, dysarthria, agnosia, amnesia, paralysis, loss of coordination, or loss of consciousness  Medication Review  SUMAtriptan, amitriptyline , cyanocobalamin, fluconazole, gabapentin , hydrocortisone, ibuprofen, ketoconazole, metFORMIN , methocarbamol , naloxone , naproxen sodium, oxyCODONE , polyethylene glycol, ramipril , rosuvastatin , and tirzepatide  History Review  Allergy: Adam Petty has no known allergies. Drug: Adam Petty  reports no history of drug use. Alcohol:  reports current alcohol use. Tobacco:  reports that he has never smoked. He has quit using smokeless tobacco.  His smokeless tobacco use included chew. Social: Adam Petty  reports that he has never smoked. He has quit using smokeless tobacco.  His smokeless tobacco use included chew. He reports current alcohol use. He reports that he does not use drugs. 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 Surgical: Adam Petty  has a past surgical history that includes  Tonsillectomy; Shoulder surgery (Right); Palate surgery; Cervical fusion (1995); Foot surgery; Lumbar laminectomy/decompression microdiscectomy (N/A, 02/06/2013); Knee arthroscopy (Right, 10/02/2013); Knee arthroscopy with medial menisectomy (Right, 03/25/2015); Knee bursectomy (Right, 03/25/2015); Cystoscopy w/ retrogrades (Right, 11/19/2015); Cystoscopy w/ retrogrades (Right, 10/03/2017); Ureteroscopy (Right, 10/03/2017); CTR (Left, 02/27/2018); Colonoscopy (01/24/2003); Colonoscopy (01/16/2018); Rotator cuff repair (Right); Rotator cuff repair (Left); Total knee arthroplasty (Right, 12/28/2021); Irrigation and debridement knee (Right, 03/08/2022); Cystoscopy/ureteroscopy/holmium laser/stent placement (Right, 08/09/2022); Hand surgery (Left, 10/21/2022); and Reverse shoulder arthroplasty (Right, 12/30/2022). Family: family history includes COPD in his mother; Cancer in his mother; Diabetes in his father; Heart disease in his father and mother; Seizures in his sister.  Laboratory Chemistry Profile   Renal Lab Results  Component Value Date   BUN 21 (H) 12/23/2022   CREATININE 0.90 12/23/2022   GFRAA >60 07/01/2019   GFRNONAA >60 12/23/2022    Hepatic Lab Results  Component Value Date   AST 21 12/27/2020   ALT 24 12/27/2020   ALBUMIN 3.8 12/27/2020   ALKPHOS 82 12/27/2020    Electrolytes Lab Results  Component Value Date   NA 137 12/23/2022   K 4.4 12/23/2022   CL 105 12/23/2022   CALCIUM  9.6 12/23/2022   MG 1.8 03/12/2015    Bone No results found for: VD25OH, VD125OH2TOT, CI6874NY7, CI7874NY7, 25OHVITD1, 25OHVITD2, 25OHVITD3, TESTOFREE, TESTOSTERONE  Inflammation (CRP: Acute Phase) (ESR: Chronic Phase) Lab Results  Component Value Date   CRP 0.6 03/12/2015   ESRSEDRATE 9 03/12/2015         Note: Above Lab results reviewed.  Recent Imaging Review  DG Shoulder Right Port CLINICAL DATA:  Postop shoulder replacement.  EXAM: RIGHT SHOULDER - 1  VIEW  COMPARISON:  Right shoulder MRI 04/19/2016.  Radiographs 06/05/2006.  FINDINGS: 1042 hours. Single AP view demonstrates interval reverse shoulder arthroplasty. The hardware appears well positioned. No evidence of acute fracture or dislocation on single AP view. There is a small amount of gas within the joint and surrounding soft tissues. No unexpected foreign body or complication identified.  IMPRESSION: Interval reverse shoulder arthroplasty without evidence of complication.  Electronically Signed   By: Elsie Perone M.D.   On: 12/30/2022 14:46 Note: Reviewed        Physical Exam  Vitals: BP (!) 143/83 (Cuff Size: Large)   Pulse 81   Temp (!) 97.3 F (36.3 C) (Temporal)   Resp 16   Ht 5' 6 (1.676 m)   Wt 225 lb (102.1 kg)   SpO2 99%   BMI 36.32 kg/m  BMI: Estimated body mass index is 36.32 kg/m as calculated from the following:   Height as of this encounter: 5' 6 (1.676 m).   Weight as of this encounter: 225 lb (102.1 kg). Ideal: Ideal body weight: 63.8 kg (140 lb 10.5 oz) Adjusted ideal body weight: 79.1 kg (174 lb 6.3  oz) General appearance: Well nourished, well developed, and well hydrated. In no apparent acute distress Mental status: Alert, oriented x 3 (person, place, & time)       Respiratory: No evidence of acute respiratory distress Eyes: PERLA  Musculoskeletal: +LBP Assessment   Diagnosis Status  1. Chronic pain syndrome   2. Chronic low back pain (WC injury) (1ry area of Pain) (Bilateral) (R>L) w/o sciatica   3. Chronic use of opiate for therapeutic purpose   4. Chronic knee pain (2ry area of Pain) (WC injury) (Right)   5. Chronic lower extremity pain (Right)   6. 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 Chronic shoulder pain (3ry area of Pain) (Right)    Controlled Controlled Controlled   Updated Problems: No problems updated.  Plan of Care  Problem-specific:  Assessment and Plan Chronic pain syndrome: Patient's pain is  controlled with oxycodone , will continue on current medication regimen.  Prescribing drug monitoring (PDMP) reviewed, findings consistent with the use of prescribed medication and no evidence of narcotic misuse or abuse.  Urine drug screening (UDS) up-to-date.  No adverse reaction or side effects reported to this visit.  Schedule follow-up in 90 days for medication management.  Chronic low back pain:  The patient continues to experience chronic low back pain.  He previously underwent interventional therapy, including lumbar radiofrequency ablation.  Despite this treatments, he reports ongoing low back pain with a pain level of 4/10, which is currently well-managed with his existing pain medication regimen.  Mr. Michoel Kunin. has a current medication list which includes the following long-term medication(s): amitriptyline , gabapentin , sumatriptan, [START ON 11/04/2023] oxycodone , [START ON 12/04/2023] oxycodone , and [START ON 01/03/2024] oxycodone .  Pharmacotherapy (Medications Ordered): Meds ordered this encounter  Medications   oxyCODONE  (OXY IR/ROXICODONE ) 5 MG immediate release tablet    Sig: Take 1 tablet (5 mg total) by mouth every 6 (six) hours as needed for severe pain (pain score 7-10). Must last 30 days.    Dispense:  120 tablet    Refill:  0    DO NOT: delete (not duplicate); no partial-fill (will deny script to complete), no refill request (F/U required). DISPENSE: 1 day early if closed on fill date. WARN: No CNS-depressants within 8 hrs of med.   oxyCODONE  (OXY IR/ROXICODONE ) 5 MG immediate release tablet    Sig: Take 1 tablet (5 mg total) by mouth every 6 (six) hours as needed for severe pain (pain score 7-10). Must last 30 days.    Dispense:  120 tablet    Refill:  0    DO NOT: delete (not duplicate); no partial-fill (will deny script to complete), no refill request (F/U required). DISPENSE: 1 day early if closed on fill date. WARN: No CNS-depressants within 8 hrs of med.    oxyCODONE  (OXY IR/ROXICODONE ) 5 MG immediate release tablet    Sig: Take 1 tablet (5 mg total) by mouth every 6 (six) hours as needed for severe pain (pain score 7-10). Must last 30 days.    Dispense:  120 tablet    Refill:  0    DO NOT: delete (not duplicate); no partial-fill (will deny script to complete), no refill request (F/U required). DISPENSE: 1 day early if closed on fill date. WARN: No CNS-depressants within 8 hrs of med.   naloxone  (NARCAN ) nasal spray 4 mg/0.1 mL    Sig: Place 1 spray into the nose as needed for up to 365 doses (for opioid-induced respiratory depresssion). In  case of emergency (overdose), spray once into each nostril. If no response within 3 minutes, repeat application and call 911.    Dispense:  1 each    Refill:  0    Instruct patient in proper use of device.   Orders:  No orders of the defined types were placed in this encounter.    Interventional Therapies  Risk Factors  Considerations:  MO (BMI>40)  T2IDDM  HTN  IBS  OSA      Planned  Pending:        Under consideration:   Diagnostic right knee genicular NB     Completed:   Diagnostic right lateral elbow steroid injection x1 (11/02/2021) (100/100/0/0)  Palliative right lumbar facet MBB x5 (05/20/2021) (80/80/80/80)  Palliative left lumbar facet MBB x2 (12/16/2015) (60/60/50/50)  Palliative right lumbar facet RFA x1  (Right: 04/13/2016) (100% relief) (100/100/100/100)  Diagnostic/therapeutic bilateral SI joint block x2 (11/17/2016) (NP-NR) Diagnostic right Suprascapular NB x1 (06/22/2016) (0/15/50/50)     Completed by other providers:   Diagnostic right caudal ESI x1 + epidurogram x1 (10/02/2019) by Wallie Sherry, MD  Surgery: Right total knee arthroplasty x1 (12/28/2021) by Dr. DOROTHA Sick.  Surgery: L3-4 and L4-5 lumbar microdiscectomy/decompression (02/06/2013) by Dr. JINNY Billing     Therapeutic  Palliative (PRN) options:   Palliative lumbar facet MBB  Palliative right lumbar facet RFA    Diagnostic Suprascapular NB     Pharmacotherapy  Nonopioids transfer 11/04/2019: Neurontin          Return in about 3 months (around 01/23/2024) for (F2F), (MM), Emmy Blanch NP.    Recent Visits Date Type Provider Dept  07/26/23 Office Visit Shenequa Howse K, NP [facility]  Showing recent visits within past 90 days and meeting all other requirements Today's Visits Date Type Provider Dept  10/23/23 Office Visit Lanecia Sliva K, NP [facility]  Showing today's visits and meeting all other requirements Future Appointments Date Type Provider Dept  01/17/24 Appointment Aryel Edelen K, NP [facility]  Showing future appointments within next 90 days and meeting all other requirements  I discussed the assessment and treatment plan with the patient. The patient was provided an opportunity to ask questions and all were answered. The patient agreed with the plan and demonstrated an understanding of the instructions.  Patient advised to call back or seek an in-person evaluation if the symptoms or condition worsens. I personally spent a total of 30 minutes in the care of the patient today including preparing to see the patient, getting/reviewing separately obtained history, performing a medically appropriate exam/evaluation, counseling and educating, placing orders, referring and communicating with other health care professionals, documenting clinical information in the EHR, independently interpreting results, communicating results, and coordinating care.   Note by: Emmy MARLA Blanch, NP  Date: 10/23/2023; Time: 12:10 PM

## 2023-10-23 NOTE — Progress Notes (Signed)
 Nursing Pain Medication Assessment:  Safety precautions to be maintained throughout the outpatient stay will include: orient to surroundings, keep bed in low position, maintain call bell within reach at all times, provide assistance with transfer out of bed and ambulation.  Medication Inspection Compliance: Pill count conducted under aseptic conditions, in front of the patient. Neither the pills nor the bottle was removed from the patient's sight at any time. Once count was completed pills were immediately returned to the patient in their original bottle.  Medication: Oxycodone  IR Pill/Patch Count: 50 of 120 pills/patches remain Pill/Patch Appearance: Markings consistent with prescribed medication Bottle Appearance: Standard pharmacy container. Clearly labeled. Filled Date: 09 / 19 / 2025 Last Medication intake:  TodaySafety precautions to be maintained throughout the outpatient stay will include: orient to surroundings, keep bed in low position, maintain call bell within reach at all times, provide assistance with transfer out of bed and ambulation.

## 2024-01-02 NOTE — Progress Notes (Unsigned)
 PROVIDER NOTE: Interpretation of information contained herein should be left to medically-trained personnel. Specific patient instructions are provided elsewhere under Patient Instructions section of medical record. This document was created in part using AI and STT-dictation technology, any transcriptional errors that may result from this process are unintentional.  Patient: Adam Petty.  Service: E/M   PCP: Adam Perkins, PA-C  DOB: 08-Jan-1961  DOS: 01/03/2024  Provider: Emmy MARLA Blanch, NP  MRN: [id number]  Delivery: Face-to-face  Specialty: Interventional Pain Management  Type: Established Patient  Setting: Ambulatory outpatient facility  Specialty designation: 09  Referring Prov.: Adam Perkins, PA-C  Location: Outpatient office facility       History of present illness (HPI) [name], a 63 y.o. year old male, is here today because of his low back pain. 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); Discogenic syndrome, lumbar (WC injury); Osteoarthritis of spine with radiculopathy, lumbar region (WC injury); Lumbar facet syndrome (WC injury) (Bilateral) (R>L); Lumbosacral radiculopathy (WC injury); Obesity, Class III, BMI 40-49.9 (morbid obesity) (HCC); Uncomplicated opioid dependence (HCC); Therapeutic opioid-induced constipation (OIC); Lumbar canal stenosis; Type 2 diabetes mellitus with hyperglycemia, with long-term current use of insulin  (HCC); Long term prescription opiate use; Encounter for chronic pain management; Neurogenic pain; Neuropathic pain; Musculoskeletal pain; Lumbar spondylosis; Chronic knee pain (2ry area of  Pain) (WC injury) (Right); Osteoarthritis of knee (Right); Chronic shoulder pain (3ry area of Pain) (Right); Osteoarthritis of shoulder (Right); Chronic lower extremity pain (Right); and Lumbosacral radiculopathy (S1) (Right) on their pertinent problem list.  Pain Assessment: Severity of Chronic pain is reported as a 4 /10. Location: Back Lower/Down right leg to calf. Onset: More than a month ago. Quality: Sharp. Timing: Constant. Modifying factor(s): Heat. Vitals:  height is 5' 6 (1.676 m) and weight is 225 lb (102.1 kg). His temporal temperature is 97.1 F (36.2 C) (abnormal). His blood pressure is 143/74 (abnormal) and his pulse is 94. His respiration is 18 and oxygen saturation is 100%.  BMI: Estimated body mass index is 36.32 kg/m as calculated from the following:   Height as of this encounter: 5' 6 (1.676 m).   Weight as of this encounter: 225 lb (102.1 kg).  Last encounter: 10/23/2023. Last procedure: 10/23/2023.  Reason for encounter: medication management.  The patient indicates doing well with current medication regimen.  No side effects or adverse reaction reported to medication.  Discussed the use of AI scribe software for clinical note transcription with the patient, who gave verbal consent to proceed.  History of Present Illness   Adam Petty. Dempsey is a 63 year old male who presents with lower back pain radiating to the calf. He experiences persistent lower back pain radiating down the buttock to the calf, without extension to the toes. He underwent a knee replacement two years ago, which alleviated knee pain but did not resolve the discomfort in the lower back and calf. He is currently taking oxycodone  5 mg every six hours for pain management, with no reported side effects.    Pharmacotherapy Assessment   Oxycodone  IR 5 mg, 1 tab PO q 6 hrs (20 mg/day)  MME/day: 30 mg/day Monitoring: [place] PMP:  PDMP reviewed during this encounter.       Pharmacotherapy: No  side-effects or adverse reactions reported. Compliance: No problems identified. Effectiveness: Clinically acceptable.  Erlene Doyal SAUN[place], [place]  01/03/2024  8:43 AM  Sign when Signing Visit Nursing Pain Medication Assessment:  Safety precautions to be maintained throughout the outpatient stay will include: orient to surroundings, keep bed in low position, maintain call bell within reach at all times, provide assistance with transfer out of bed and ambulation.  Medication Inspection Compliance: Pill count conducted under aseptic conditions, in front of the patient. Neither the pills nor the bottle was removed from the patient's sight at any time. Once count was completed pills were immediately returned to the patient in their original bottle.  Medication: Oxycodone  IR Pill/Patch Count: 12 of 120 pills/patches remain Pill/Patch Appearance: Markings consistent with prescribed medication Bottle Appearance: Standard pharmacy container. Clearly labeled. Filled Date: 41 / 19 / 2025 Last Medication intake:  Today    UDS:  Summary  Date Value Ref Range Status  07/26/2023 FINAL  Final    Comment:    ==================================================================== [employer] [facility] [employer] (MW) ==================================================================== Test                             Result       Flag       Units  Drug Present and Declared for Prescription Verification   Oxycodone                       1220         EXPECTED   ng/mg creat   Oxymorphone                    935          EXPECTED   ng/mg creat   Noroxycodone                   574          EXPECTED   ng/mg creat   Noroxymorphone                 79           EXPECTED   ng/mg creat    Sources of oxycodone  are scheduled prescription medications.    Oxymorphone, noroxycodone, and noroxymorphone are expected    metabolites of oxycodone . Oxymorphone is also available as a    scheduled prescription  medication.  ==================================================================== Test                      Result    Flag   Units      Ref Range   Creatinine              197              mg/dL      >=79 ==================================================================== Declared Medications:  The flagging and interpretation on this report are based on the  following declared medications.  Unexpected results may arise from  inaccuracies in the declared medications.   **Note: The testing scope of this panel includes these medications:   Oxycodone  (Roxicodone )   **Note: The testing scope of this panel does not include the  following reported medications:   Amitriptyline  (Elavil )  Docusate (Colace)  Gabapentin  (Neurontin )  Ibuprofen (Advil)  Metformin  (Glucophage )  Methocarbamol  (Robaxin )  Naloxone  (Narcan )  Naproxen (Aleve)  Polyethylene Glycol (MiraLAX )  Ramipril  (Altace )  Rosuvastatin  (Crestor )  Tirzepatide (Mounjaro)  Vitamin B12 ==================================================================== For clinical consultation, please call [phone]. ====================================================================     No results found for: CBDTHCR No results found for: D8THCCBX No results found for: D9THCCBX  ROS  Constitutional: Denies any fever or chills Gastrointestinal: No reported hemesis, hematochezia, vomiting, or acute GI distress Musculoskeletal: lower back pain radiating down the buttock to the calf, without extension to the toes Neurological: No reported episodes of acute onset apraxia, aphasia, dysarthria, agnosia, amnesia, paralysis, loss of coordination, or loss of consciousness  Medication Review  SUMAtriptan, Turmeric, amitriptyline , cyanocobalamin, ferrous sulfate, fluconazole, gabapentin , hydrocortisone, ibuprofen, ketoconazole, metFORMIN , methocarbamol , naloxone , naproxen sodium, oxyCODONE , polyethylene glycol, ramipril , rosuvastatin ,  and tirzepatide  History Review  Allergy: Adam Petty has no known allergies. Drug: Adam Petty  reports no history of drug use. Alcohol:  reports current alcohol use. Tobacco:  reports that he has never smoked. He has quit using smokeless tobacco.  His smokeless tobacco use included chew. Social: Adam Petty  reports that he has never smoked. He has quit using smokeless tobacco.  His smokeless tobacco use included chew. He reports current alcohol use. He reports that he does not use drugs. 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 Surgical: Adam Petty  has a past surgical history that includes Tonsillectomy; Shoulder surgery (Right); Palate surgery; Cervical fusion (1995); Foot surgery; Lumbar laminectomy/decompression microdiscectomy (N/A, 02/06/2013); Knee arthroscopy (Right, 10/02/2013); Knee arthroscopy with medial menisectomy (Right, 03/25/2015); Knee bursectomy (Right, 03/25/2015); Cystoscopy w/ retrogrades (Right, 11/19/2015); Cystoscopy w/ retrogrades (Right, 10/03/2017); Ureteroscopy (Right, 10/03/2017); CTR (Left, 02/27/2018); Colonoscopy (01/24/2003); Colonoscopy (01/16/2018); Rotator cuff repair (Right); Rotator cuff repair (Left); Total knee arthroplasty (Right, 12/28/2021); Irrigation and debridement knee (Right, 03/08/2022); Cystoscopy/ureteroscopy/holmium laser/stent placement (Right, 08/09/2022); Hand surgery (Left, 10/21/2022); and Reverse shoulder arthroplasty (Right, 12/30/2022). Family: family history includes COPD in his mother;  Cancer in his mother; Diabetes in his father; Heart disease in his father and mother; Seizures in his sister.  Laboratory Chemistry Profile   Renal Lab Results  Component Value Date   BUN 21 (H) 12/23/2022   CREATININE 0.90 12/23/2022   GFRAA >60 07/01/2019   GFRNONAA >60 12/23/2022    Hepatic Lab Results  Component Value Date   AST 21 12/27/2020   ALT 24 12/27/2020   ALBUMIN 3.8 12/27/2020   ALKPHOS 82 12/27/2020    Electrolytes Lab Results  Component Value Date   NA 137 12/23/2022   K 4.4 12/23/2022   CL 105 12/23/2022   CALCIUM  9.6 12/23/2022   MG 1.8 03/12/2015    Bone No results found for: VD25OH, VD125OH2TOT, CI6874NY7, CI7874NY7, 25OHVITD1, 25OHVITD2, 25OHVITD3, TESTOFREE, TESTOSTERONE  Inflammation (CRP: Acute Phase) (ESR: Chronic Phase) Lab Results  Component Value Date   CRP 0.6 03/12/2015   ESRSEDRATE 9 03/12/2015         Note: Above Lab results reviewed.  Recent Imaging Review  DG Shoulder Right Port CLINICAL DATA:  Postop shoulder replacement.  EXAM: RIGHT SHOULDER - 1 VIEW  COMPARISON:  Right shoulder MRI 04/19/2016.  Radiographs 06/05/2006.  FINDINGS: 1042 hours. Single AP view demonstrates interval reverse shoulder arthroplasty. The hardware appears well positioned. No evidence of acute fracture or dislocation on  single AP view. There is a small amount of gas within the joint and surrounding soft tissues. No unexpected foreign body or complication identified.  IMPRESSION: Interval reverse shoulder arthroplasty without evidence of complication.  Electronically Signed   By: Elsie Perone M.D.   On: 12/30/2022 14:46 Note: Reviewed        Physical Exam  Vitals: BP (!) 143/74 (BP Location: Right Arm, Patient Position: Sitting, Cuff Size: Normal)   Pulse 94   Temp (!) 97.1 F (36.2 C) (Temporal)   Resp 18   Ht 5' 6 (1.676 m)   Wt 225 lb (102.1 kg)   SpO2 100%   BMI 36.32 kg/m  BMI: Estimated body mass index is  36.32 kg/m as calculated from the following:   Height as of this encounter: 5' 6 (1.676 m).   Weight as of this encounter: 225 lb (102.1 kg). Ideal: Ideal body weight: 63.8 kg (140 lb 10.5 oz) Adjusted ideal body weight: 79.1 kg (174 lb 6.3 oz) General appearance: Well nourished, well developed, and well hydrated. In no apparent acute distress Mental status: Alert, oriented x 3 (person, place, & time)       Respiratory: No evidence of acute respiratory distress Eyes: PERLA  Musculoskeletal: +LBP Assessment   Diagnosis Status  1. Chronic low back pain (WC injury) (1ry area of Pain) (Bilateral) (R>L) w/o sciatica   2. Medication management   3. Chronic use of opiate for therapeutic purpose   4. Chronic knee pain (2ry area of Pain) (WC injury) (Right)   5. Chronic lower extremity pain (Right)   6. 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 Chronic shoulder pain (3ry area of Pain) (Right)   8. Chronic pain syndrome    Controlled Controlled Controlled   Updated Problems: Problem  Medication Management    Plan of Care  Problem-specific:  Assessment and Plan    Chronic low back pain Radiation to right buttock and calf, persistent since knee replacement two years ago. - Continue oxycodone  5 mg every six hours.  Chronic pain of right knee and right lower extremity Pain radiates from lower back to calf, persistent since knee replacement two years ago. - Continue oxycodone  5 mg every six hours.  Chronic opioid therapy for pain management Oxycodone  5 mg every six hours with no reported side effects. - Continue oxycodone  5 mg every six hours.  Patient's pain is controlled with oxycodone , will continue on current medication regimen.  Prescribing drug monitoring (PDMP) reviewed, findings consistent with the use of prescribed medication and no evidence of narcotic misuse or abuse.  Urine drug screening (UDS) up to date.  No side effects or adverse reaction reported to  medication.  The patient was advised to use over-the-counter MiraLAX  or increase water delvin in diet to prevent opioid induced constipation.  Schedule follow-up in 90 days for medication management.      Mr. Damarrion Mimbs. has a current medication list which includes the following long-term medication(s): amitriptyline , ferrous sulfate, gabapentin , sumatriptan, [START ON 02/02/2024] oxycodone , [START ON 03/03/2024] oxycodone , and [START ON 04/02/2024] oxycodone .  Pharmacotherapy (Medications Ordered): Meds ordered this encounter  Medications   oxyCODONE  (OXY IR/ROXICODONE ) 5 MG immediate release tablet    Sig: Take 1 tablet (5 mg total) by mouth every 6 (six) hours as needed for severe pain (pain score 7-10). Must last 30 days.    Dispense:  120 tablet    Refill:  0    DO NOT: delete (not  duplicate); no partial-fill (will deny script to complete), no refill request (F/U required). DISPENSE: 1 day early if closed on fill date. WARN: No CNS-depressants within 8 hrs of med.   oxyCODONE  (OXY IR/ROXICODONE ) 5 MG immediate release tablet    Sig: Take 1 tablet (5 mg total) by mouth every 6 (six) hours as needed for severe pain (pain score 7-10). Must last 30 days.    Dispense:  120 tablet    Refill:  0    DO NOT: delete (not duplicate); no partial-fill (will deny script to complete), no refill request (F/U required). DISPENSE: 1 day early if closed on fill date. WARN: No CNS-depressants within 8 hrs of med.   oxyCODONE  (OXY IR/ROXICODONE ) 5 MG immediate release tablet    Sig: Take 1 tablet (5 mg total) by mouth every 6 (six) hours as needed for severe pain (pain score 7-10). Must last 30 days.    Dispense:  120 tablet    Refill:  0    DO NOT: delete (not duplicate); no partial-fill (will deny script to complete), no refill request (F/U required). DISPENSE: 1 day early if closed on fill date. WARN: No CNS-depressants within 8 hrs of med.   Orders:  No orders of the defined types were placed in  this encounter.    Interventional Therapies  Risk Factors  Considerations:  MO (BMI>40)  T2IDDM  HTN  IBS  OSA      Planned  Pending:        Under consideration:   Diagnostic right knee genicular NB     Completed:   Diagnostic right lateral elbow steroid injection x1 (11/02/2021) (100/100/0/0)  Palliative right lumbar facet MBB x5 (05/20/2021) (80/80/80/80)  Palliative left lumbar facet MBB x2 (12/16/2015) (60/60/50/50)  Palliative right lumbar facet RFA x1  (Right: 04/13/2016) (100% relief) (100/100/100/100)  Diagnostic/therapeutic bilateral SI joint block x2 (11/17/2016) (NP-NR) Diagnostic right Suprascapular NB x1 (06/22/2016) (0/15/50/50)     Completed by other providers:   Diagnostic right caudal ESI x1 + epidurogram x1 (10/02/2019) by Wallie Sherry, MD  Surgery: Right total knee arthroplasty x1 (12/28/2021) by Dr. DOROTHA Sick.  Surgery: L3-4 and L4-5 lumbar microdiscectomy/decompression (02/06/2013) by Dr. JINNY Billing     Therapeutic  Palliative (PRN) options:   Palliative lumbar facet MBB  Palliative right lumbar facet RFA   Diagnostic Suprascapular NB     Pharmacotherapy  Nonopioids transfer 11/04/2019: Neurontin          Return in about 3 months (around 04/02/2024) for (F2F), (MM), Emmy Blanch NP.    Recent Visits Date Type Provider Dept  10/23/23 Office Visit Henley Blyth K, NP [facility]  Showing recent visits within past 90 days and meeting all other requirements Today's Visits Date Type Provider Dept  01/03/24 Office Visit Xoie Kreuser K, NP [facility]  Showing today's visits and meeting all other requirements Future Appointments Date Type Provider Dept  04/01/24 Appointment Jesilyn Easom K, NP [facility]  Showing future appointments within next 90 days and meeting all other requirements  I discussed the assessment and treatment plan with the patient. The patient was provided an opportunity to ask questions and all were  answered. The patient agreed with the plan and demonstrated an understanding of the instructions.  Patient advised to call back or seek an in-person evaluation if the symptoms or condition worsens.  I personally spent a total of 30 minutes in the care of the patient today including preparing to see the patient, getting/reviewing  separately obtained history, performing a medically appropriate exam/evaluation, counseling and educating, placing orders, referring and communicating with other health care professionals, documenting clinical information in the EHR, independently interpreting results, communicating results, and coordinating care.   Note by: Brant Peets K Alecia Doi, NP (TTS and AI technology used. I apologize for any typographical errors that were not detected and corrected.) Date: 01/03/2024; Time: 9:04 AM

## 2024-01-03 ENCOUNTER — Encounter: Payer: Self-pay | Admitting: Nurse Practitioner

## 2024-01-03 ENCOUNTER — Ambulatory Visit: Attending: Nurse Practitioner | Admitting: Nurse Practitioner

## 2024-01-03 VITALS — BP 143/74 | HR 94 | Temp 97.1°F | Resp 18 | Ht 66.0 in | Wt 225.0 lb

## 2024-01-03 DIAGNOSIS — M545 Low back pain, unspecified: Secondary | ICD-10-CM | POA: Diagnosis present

## 2024-01-03 DIAGNOSIS — Z79891 Long term (current) use of opiate analgesic: Secondary | ICD-10-CM | POA: Diagnosis present

## 2024-01-03 DIAGNOSIS — Z79899 Other long term (current) drug therapy: Secondary | ICD-10-CM | POA: Diagnosis present

## 2024-01-03 DIAGNOSIS — M25511 Pain in right shoulder: Secondary | ICD-10-CM | POA: Insufficient documentation

## 2024-01-03 DIAGNOSIS — M79604 Pain in right leg: Secondary | ICD-10-CM | POA: Insufficient documentation

## 2024-01-03 DIAGNOSIS — M25561 Pain in right knee: Secondary | ICD-10-CM | POA: Insufficient documentation

## 2024-01-03 DIAGNOSIS — G8929 Other chronic pain: Secondary | ICD-10-CM | POA: Diagnosis present

## 2024-01-03 DIAGNOSIS — G894 Chronic pain syndrome: Secondary | ICD-10-CM | POA: Diagnosis present

## 2024-01-03 DIAGNOSIS — M961 Postlaminectomy syndrome, not elsewhere classified: Secondary | ICD-10-CM | POA: Insufficient documentation

## 2024-01-03 MED ORDER — OXYCODONE HCL 5 MG PO TABS: 5.0000 mg | ORAL_TABLET | Freq: Four times a day (QID) | ORAL | 0 refills | Status: AC | PRN

## 2024-01-03 NOTE — Progress Notes (Signed)
 Nursing Pain Medication Assessment:  Safety precautions to be maintained throughout the outpatient stay will include: orient to surroundings, keep bed in low position, maintain call bell within reach at all times, provide assistance with transfer out of bed and ambulation.  Medication Inspection Compliance: Pill count conducted under aseptic conditions, in front of the patient. Neither the pills nor the bottle was removed from the patient's sight at any time. Once count was completed pills were immediately returned to the patient in their original bottle.  Medication: Oxycodone  IR Pill/Patch Count: 12 of 120 pills/patches remain Pill/Patch Appearance: Markings consistent with prescribed medication Bottle Appearance: Standard pharmacy container. Clearly labeled. Filled Date: 71 / 16 / 2025 Last Medication intake:  Today

## 2024-01-09 ENCOUNTER — Ambulatory Visit: Admitting: Neurology

## 2024-01-17 ENCOUNTER — Encounter: Admitting: Nurse Practitioner

## 2024-02-27 ENCOUNTER — Ambulatory Visit: Admitting: Neurology

## 2024-04-01 ENCOUNTER — Encounter: Admitting: Nurse Practitioner
